# Patient Record
Sex: Female | Born: 1990 | Race: Black or African American | Hispanic: No | Marital: Single | Smoking: Former smoker
Health system: Southern US, Community
[De-identification: ages and names within clinical notes are randomized; demographics above are authoritative.]

## PROBLEM LIST (undated history)

## (undated) DIAGNOSIS — I10 Essential (primary) hypertension: Secondary | ICD-10-CM

## (undated) DIAGNOSIS — N6012 Diffuse cystic mastopathy of left breast: Secondary | ICD-10-CM

## (undated) DIAGNOSIS — F419 Anxiety disorder, unspecified: Secondary | ICD-10-CM

## (undated) DIAGNOSIS — N83209 Unspecified ovarian cyst, unspecified side: Secondary | ICD-10-CM

## (undated) DIAGNOSIS — N6011 Diffuse cystic mastopathy of right breast: Secondary | ICD-10-CM

## (undated) DIAGNOSIS — E559 Vitamin D deficiency, unspecified: Secondary | ICD-10-CM

## (undated) DIAGNOSIS — M549 Dorsalgia, unspecified: Secondary | ICD-10-CM

## (undated) DIAGNOSIS — R7303 Prediabetes: Secondary | ICD-10-CM

## (undated) DIAGNOSIS — E119 Type 2 diabetes mellitus without complications: Secondary | ICD-10-CM

## (undated) DIAGNOSIS — K219 Gastro-esophageal reflux disease without esophagitis: Secondary | ICD-10-CM

## (undated) DIAGNOSIS — G4700 Insomnia, unspecified: Secondary | ICD-10-CM

## (undated) DIAGNOSIS — M2550 Pain in unspecified joint: Secondary | ICD-10-CM

## (undated) DIAGNOSIS — A749 Chlamydial infection, unspecified: Secondary | ICD-10-CM

## (undated) DIAGNOSIS — B977 Papillomavirus as the cause of diseases classified elsewhere: Secondary | ICD-10-CM

## (undated) DIAGNOSIS — F32A Depression, unspecified: Secondary | ICD-10-CM

## (undated) DIAGNOSIS — F329 Major depressive disorder, single episode, unspecified: Secondary | ICD-10-CM

## (undated) DIAGNOSIS — N871 Moderate cervical dysplasia: Secondary | ICD-10-CM

## (undated) DIAGNOSIS — R600 Localized edema: Secondary | ICD-10-CM

## (undated) DIAGNOSIS — Z98891 History of uterine scar from previous surgery: Secondary | ICD-10-CM

## (undated) DIAGNOSIS — R200 Anesthesia of skin: Secondary | ICD-10-CM

## (undated) DIAGNOSIS — E7800 Pure hypercholesterolemia, unspecified: Secondary | ICD-10-CM

## (undated) HISTORY — DX: Diffuse cystic mastopathy of left breast: N60.12

## (undated) HISTORY — DX: History of uterine scar from previous surgery: Z98.891

## (undated) HISTORY — DX: Anesthesia of skin: R20.0

## (undated) HISTORY — DX: Gastro-esophageal reflux disease without esophagitis: K21.9

## (undated) HISTORY — DX: Prediabetes: R73.03

## (undated) HISTORY — DX: Pure hypercholesterolemia, unspecified: E78.00

## (undated) HISTORY — DX: Vitamin D deficiency, unspecified: E55.9

## (undated) HISTORY — DX: Insomnia, unspecified: G47.00

## (undated) HISTORY — DX: Dorsalgia, unspecified: M54.9

## (undated) HISTORY — PX: BREAST SURGERY: SHX581

## (undated) HISTORY — DX: Localized edema: R60.0

## (undated) HISTORY — DX: Pain in unspecified joint: M25.50

## (undated) HISTORY — DX: Moderate cervical dysplasia: N87.1

## (undated) HISTORY — PX: LEEP: SHX91

## (undated) HISTORY — DX: Diffuse cystic mastopathy of right breast: N60.11

---
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
## 2009-01-02 ENCOUNTER — Emergency Department (HOSPITAL_COMMUNITY): Admission: EM | Admit: 2009-01-02 | Discharge: 2009-01-02 | Payer: Self-pay | Admitting: Emergency Medicine

## 2009-01-06 ENCOUNTER — Inpatient Hospital Stay (HOSPITAL_COMMUNITY): Admission: AD | Admit: 2009-01-06 | Discharge: 2009-01-06 | Payer: Self-pay | Admitting: Obstetrics & Gynecology

## 2009-01-11 ENCOUNTER — Emergency Department (HOSPITAL_COMMUNITY): Admission: EM | Admit: 2009-01-11 | Discharge: 2009-01-11 | Payer: Self-pay | Admitting: Emergency Medicine

## 2009-01-27 ENCOUNTER — Emergency Department (HOSPITAL_COMMUNITY): Admission: EM | Admit: 2009-01-27 | Discharge: 2009-01-27 | Payer: Self-pay | Admitting: Family Medicine

## 2009-03-02 ENCOUNTER — Ambulatory Visit: Payer: Self-pay | Admitting: Obstetrics and Gynecology

## 2009-03-08 ENCOUNTER — Ambulatory Visit (HOSPITAL_COMMUNITY): Admission: RE | Admit: 2009-03-08 | Discharge: 2009-03-08 | Payer: Self-pay | Admitting: Obstetrics & Gynecology

## 2009-03-16 ENCOUNTER — Other Ambulatory Visit: Payer: Self-pay | Admitting: Obstetrics and Gynecology

## 2009-03-16 ENCOUNTER — Ambulatory Visit: Payer: Self-pay | Admitting: Obstetrics and Gynecology

## 2009-05-18 ENCOUNTER — Emergency Department (HOSPITAL_COMMUNITY): Admission: EM | Admit: 2009-05-18 | Discharge: 2009-05-18 | Payer: Self-pay | Admitting: Family Medicine

## 2009-05-23 ENCOUNTER — Inpatient Hospital Stay (HOSPITAL_COMMUNITY): Admission: AD | Admit: 2009-05-23 | Discharge: 2009-05-23 | Payer: Self-pay | Admitting: Obstetrics & Gynecology

## 2009-05-23 ENCOUNTER — Ambulatory Visit: Payer: Self-pay | Admitting: Obstetrics & Gynecology

## 2009-06-06 ENCOUNTER — Emergency Department (HOSPITAL_COMMUNITY): Admission: EM | Admit: 2009-06-06 | Discharge: 2009-06-07 | Payer: Self-pay | Admitting: Emergency Medicine

## 2009-06-07 ENCOUNTER — Emergency Department (HOSPITAL_COMMUNITY): Admission: EM | Admit: 2009-06-07 | Discharge: 2009-06-07 | Payer: Self-pay | Admitting: Family Medicine

## 2009-06-24 ENCOUNTER — Emergency Department (HOSPITAL_COMMUNITY): Admission: EM | Admit: 2009-06-24 | Discharge: 2009-06-24 | Payer: Self-pay | Admitting: Emergency Medicine

## 2010-01-25 ENCOUNTER — Emergency Department (HOSPITAL_COMMUNITY): Admission: EM | Admit: 2010-01-25 | Discharge: 2009-04-14 | Payer: Self-pay | Admitting: Emergency Medicine

## 2010-02-18 HISTORY — PX: BREAST BIOPSY: SHX20

## 2010-04-15 ENCOUNTER — Emergency Department (HOSPITAL_COMMUNITY)
Admission: EM | Admit: 2010-04-15 | Discharge: 2010-04-15 | Disposition: A | Discharge status: Home / Self Care | Payer: Self-pay | Attending: Emergency Medicine | Admitting: Emergency Medicine

## 2010-04-15 DIAGNOSIS — F319 Bipolar disorder, unspecified: Secondary | ICD-10-CM | POA: Insufficient documentation

## 2010-04-15 DIAGNOSIS — N6009 Solitary cyst of unspecified breast: Secondary | ICD-10-CM | POA: Insufficient documentation

## 2010-04-15 DIAGNOSIS — E669 Obesity, unspecified: Secondary | ICD-10-CM | POA: Insufficient documentation

## 2010-04-15 DIAGNOSIS — R51 Headache: Secondary | ICD-10-CM | POA: Insufficient documentation

## 2010-04-15 LAB — POCT PREGNANCY, URINE: Preg Test, Ur: NEGATIVE

## 2010-04-16 ENCOUNTER — Other Ambulatory Visit: Payer: Self-pay | Admitting: Emergency Medicine

## 2010-04-16 DIAGNOSIS — N644 Mastodynia: Secondary | ICD-10-CM

## 2010-04-16 LAB — URINE CULTURE: Culture  Setup Time: 201202261706

## 2010-04-18 ENCOUNTER — Other Ambulatory Visit: Payer: Self-pay | Admitting: Emergency Medicine

## 2010-04-18 ENCOUNTER — Ambulatory Visit
Admission: RE | Admit: 2010-04-18 | Discharge: 2010-04-18 | Disposition: A | Discharge status: Home / Self Care | Payer: Self-pay | Source: Ambulatory Visit | Attending: Emergency Medicine | Admitting: Emergency Medicine

## 2010-04-18 DIAGNOSIS — N6320 Unspecified lump in the left breast, unspecified quadrant: Secondary | ICD-10-CM

## 2010-04-18 DIAGNOSIS — N644 Mastodynia: Secondary | ICD-10-CM

## 2010-04-23 ENCOUNTER — Other Ambulatory Visit: Payer: Self-pay

## 2010-04-25 ENCOUNTER — Other Ambulatory Visit: Payer: Self-pay

## 2010-04-26 ENCOUNTER — Other Ambulatory Visit: Payer: Self-pay | Admitting: Diagnostic Radiology

## 2010-04-26 ENCOUNTER — Ambulatory Visit
Admission: RE | Admit: 2010-04-26 | Discharge: 2010-04-26 | Disposition: A | Discharge status: Home / Self Care | Payer: Self-pay | Source: Ambulatory Visit | Attending: Emergency Medicine | Admitting: Emergency Medicine

## 2010-04-26 DIAGNOSIS — N6320 Unspecified lump in the left breast, unspecified quadrant: Secondary | ICD-10-CM

## 2010-05-08 LAB — WET PREP, GENITAL
Trich, Wet Prep: NONE SEEN
WBC, Wet Prep HPF POC: NONE SEEN
Yeast Wet Prep HPF POC: NONE SEEN

## 2010-05-08 LAB — POCT URINALYSIS DIP (DEVICE)
Hgb urine dipstick: NEGATIVE
Ketones, ur: NEGATIVE mg/dL
Protein, ur: NEGATIVE mg/dL
Specific Gravity, Urine: 1.02 (ref 1.005–1.030)
pH: 6.5 (ref 5.0–8.0)

## 2010-05-09 LAB — URINALYSIS, ROUTINE W REFLEX MICROSCOPIC
Bilirubin Urine: NEGATIVE
Bilirubin Urine: NEGATIVE
Ketones, ur: NEGATIVE mg/dL
Ketones, ur: NEGATIVE mg/dL
Nitrite: NEGATIVE
Nitrite: NEGATIVE
Protein, ur: NEGATIVE mg/dL
Protein, ur: NEGATIVE mg/dL
Urobilinogen, UA: 0.2 mg/dL (ref 0.0–1.0)
pH: 5.5 (ref 5.0–8.0)

## 2010-05-09 LAB — CBC
Hemoglobin: 13.4 g/dL (ref 12.0–15.0)
MCHC: 33 g/dL (ref 30.0–36.0)
MCV: 94.5 fL (ref 78.0–100.0)
RDW: 13.3 % (ref 11.5–15.5)

## 2010-05-09 LAB — WET PREP, GENITAL
Clue Cells Wet Prep HPF POC: NONE SEEN
Trich, Wet Prep: NONE SEEN
Yeast Wet Prep HPF POC: NONE SEEN

## 2010-05-09 LAB — COMPREHENSIVE METABOLIC PANEL
ALT: 19 U/L (ref 0–35)
Calcium: 9.6 mg/dL (ref 8.4–10.5)
Creatinine, Ser: 0.91 mg/dL (ref 0.4–1.2)
GFR calc non Af Amer: >60 mL/min (ref >60)
Glucose, Bld: 81 mg/dL (ref 70–99)
Sodium: 136 mEq/L (ref 135–145)
Total Protein: 7.3 g/dL (ref 6.0–8.3)

## 2010-05-22 LAB — POCT URINALYSIS DIP (DEVICE)
Bilirubin Urine: NEGATIVE
Ketones, ur: NEGATIVE mg/dL
Protein, ur: NEGATIVE mg/dL
Specific Gravity, Urine: 1.02 (ref 1.005–1.030)
pH: 5.5 (ref 5.0–8.0)

## 2010-05-22 LAB — URINE CULTURE: Colony Count: >=100000

## 2010-05-23 LAB — COMPREHENSIVE METABOLIC PANEL
ALT: 19 U/L (ref 0–35)
AST: 23 U/L (ref 0–37)
Alkaline Phosphatase: 91 U/L (ref 39–117)
CO2: 25 mEq/L (ref 19–32)
CO2: 23 mEq/L (ref 19–32)
Calcium: 9.2 mg/dL (ref 8.4–10.5)
Chloride: 105 mEq/L (ref 96–112)
Creatinine, Ser: 0.88 mg/dL (ref 0.4–1.2)
GFR calc Af Amer: >60 mL/min (ref >60)
GFR calc non Af Amer: >60 mL/min (ref >60)
GFR calc non Af Amer: >60 mL/min (ref >60)
Glucose, Bld: 85 mg/dL (ref 70–99)
Glucose, Bld: 85 mg/dL (ref 70–99)
Potassium: 3.8 mEq/L (ref 3.5–5.1)
Sodium: 136 mEq/L (ref 135–145)
Total Protein: 6.9 g/dL (ref 6.0–8.3)

## 2010-05-23 LAB — DIFFERENTIAL
Basophils Relative: 1 % (ref 0–1)
Eosinophils Absolute: 0.1 10*3/uL (ref 0.0–0.7)
Lymphocytes Relative: 35 % (ref 12–46)
Lymphs Abs: 1.9 10*3/uL (ref 0.7–4.0)
Monocytes Relative: 7 % (ref 3–12)
Neutrophils Relative %: 54 % (ref 43–77)
Neutrophils Relative %: 60 % (ref 43–77)

## 2010-05-23 LAB — URINALYSIS, ROUTINE W REFLEX MICROSCOPIC
Bilirubin Urine: NEGATIVE
Bilirubin Urine: NEGATIVE
Ketones, ur: NEGATIVE mg/dL
Ketones, ur: NEGATIVE mg/dL
Leukocytes, UA: NEGATIVE
Nitrite: NEGATIVE
Nitrite: NEGATIVE
Protein, ur: NEGATIVE mg/dL
pH: 5.5 (ref 5.0–8.0)
pH: 5 (ref 5.0–8.0)

## 2010-05-23 LAB — CBC
Hemoglobin: 12.7 g/dL (ref 12.0–15.0)
MCHC: 34.4 g/dL (ref 30.0–36.0)
MCV: 95.5 fL (ref 78.0–100.0)
RBC: 3.9 MIL/uL (ref 3.87–5.11)
RBC: 3.96 MIL/uL (ref 3.87–5.11)
RDW: 12.7 % (ref 11.5–15.5)

## 2010-05-23 LAB — WET PREP, GENITAL
Clue Cells Wet Prep HPF POC: NONE SEEN
Trich, Wet Prep: NONE SEEN
Trich, Wet Prep: NONE SEEN
WBC, Wet Prep HPF POC: NONE SEEN
Yeast Wet Prep HPF POC: NONE SEEN
Yeast Wet Prep HPF POC: NONE SEEN

## 2010-05-23 LAB — POCT URINALYSIS DIP (DEVICE)
Bilirubin Urine: NEGATIVE
Hgb urine dipstick: NEGATIVE
Ketones, ur: NEGATIVE mg/dL
Nitrite: NEGATIVE
Specific Gravity, Urine: 1.015 (ref 1.005–1.030)
pH: 7.5 (ref 5.0–8.0)

## 2010-05-23 LAB — POCT PREGNANCY, URINE: Preg Test, Ur: NEGATIVE

## 2010-05-23 LAB — GC/CHLAMYDIA PROBE AMP, GENITAL: Chlamydia, DNA Probe: NEGATIVE

## 2010-05-23 LAB — URINE MICROSCOPIC-ADD ON

## 2010-05-23 LAB — AMYLASE: Amylase: 82 U/L (ref 27–131)

## 2010-05-23 LAB — LIPASE, BLOOD: Lipase: 14 U/L (ref 11–59)

## 2010-05-23 LAB — PREGNANCY, URINE: Preg Test, Ur: NEGATIVE

## 2010-10-24 IMAGING — US US TRANSVAGINAL NON-OB
1 series · 14 of 25 positions shown · non-contrast
Comparison: None

CLINICAL DATA: Lower abdominal pain, greatest on the right.

TRANSABDOMINAL AND TRANSVAGINAL ULTRASOUND OF PELVIS
TECHNIQUE: Both transabdominal and transvaginal ultrasound
examinations of the pelvis were performed including evaluation of
the uterus, ovaries, adnexal regions, and pelvic cul-de-sac.

[Series 1: us pelvis complete · 62 acquisitions, 14 frames shown]
[im 1/62]
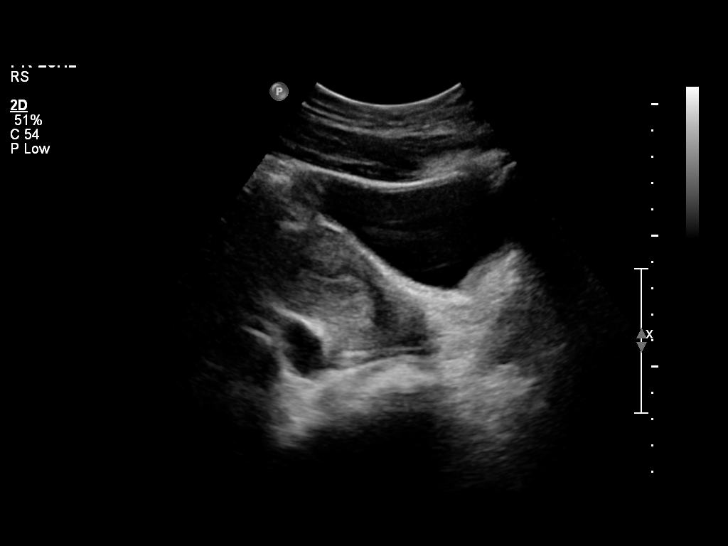
[im 6/62]
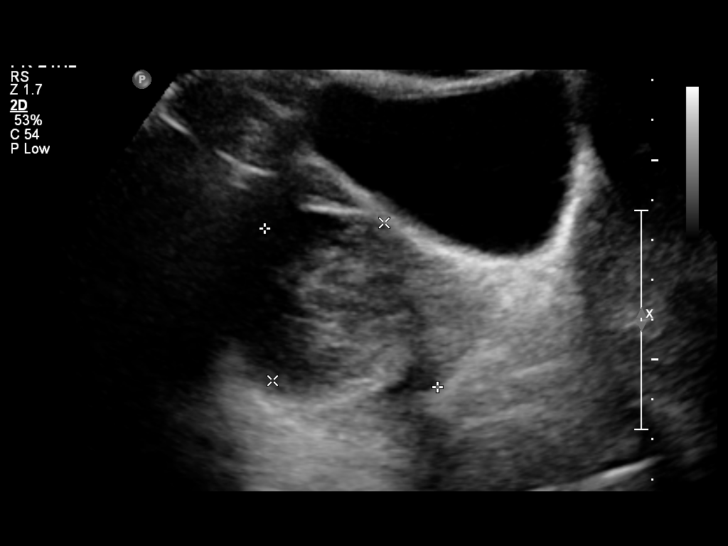
[im 11/62]
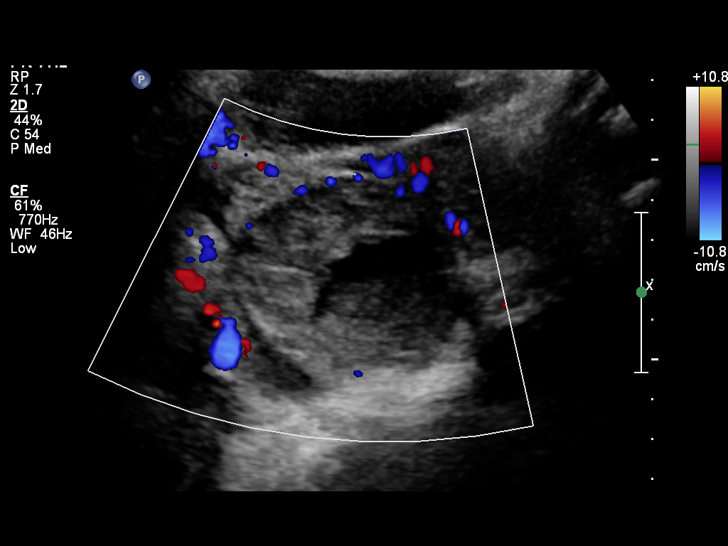
[im 16/62]
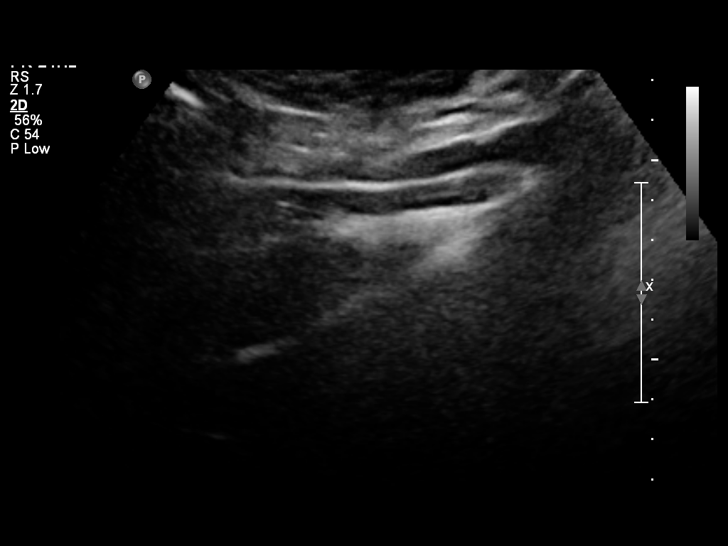
[im 21/62]
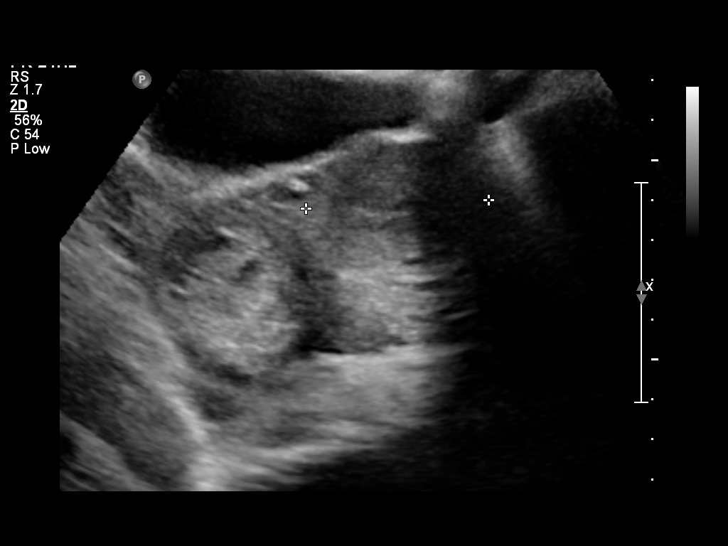
[im 23/62]
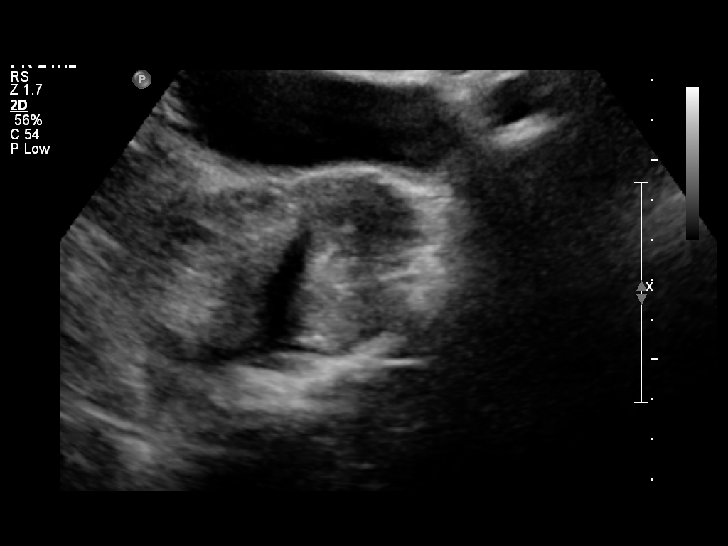
[im 28/62]
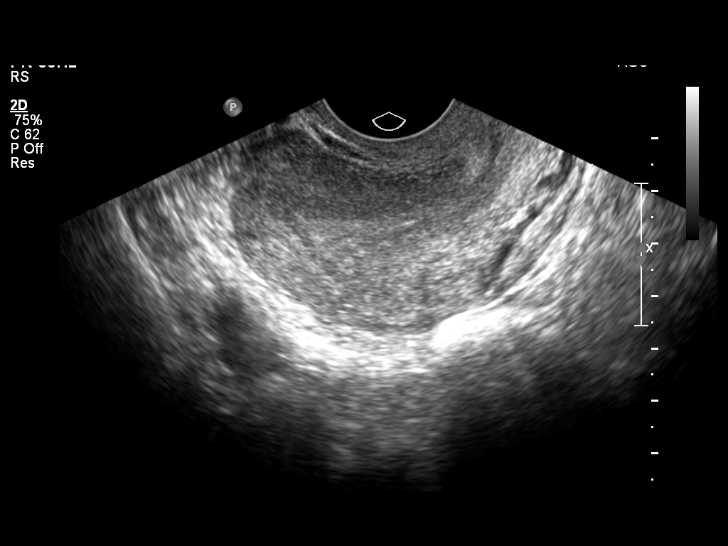
[im 34/62]
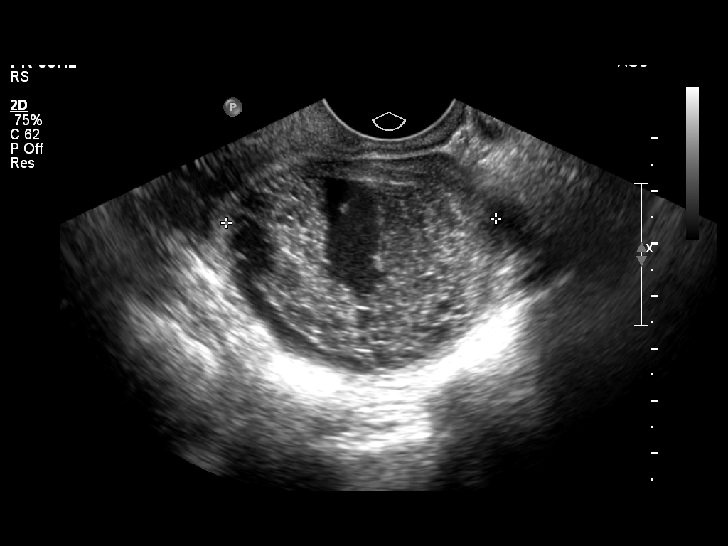
[im 39/62]
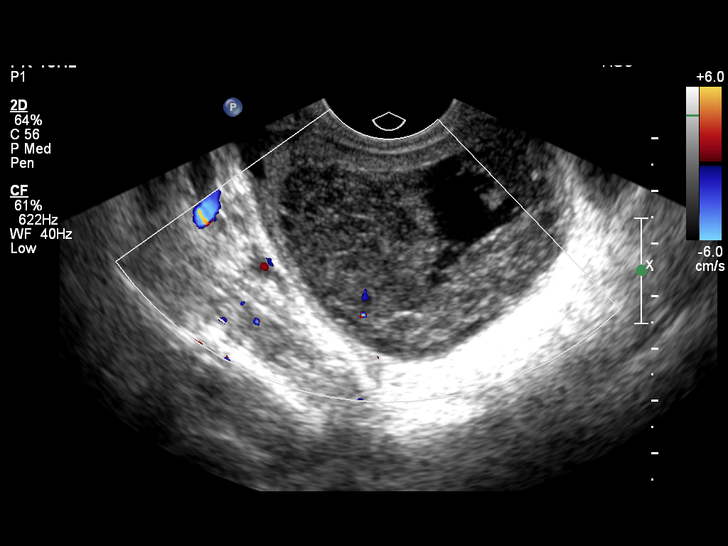
[im 41/62]
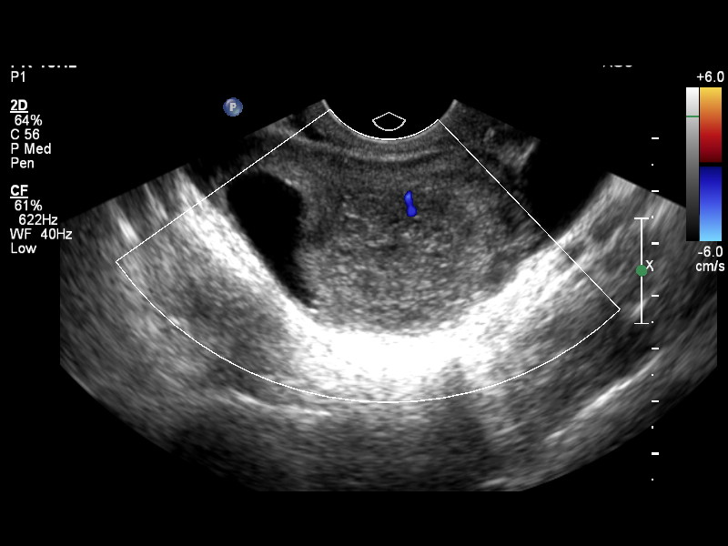
[im 46/62]
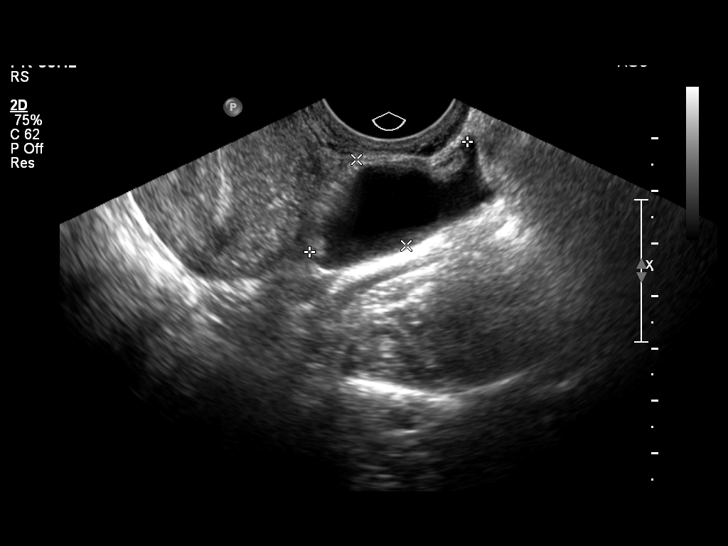
[im 51/62]
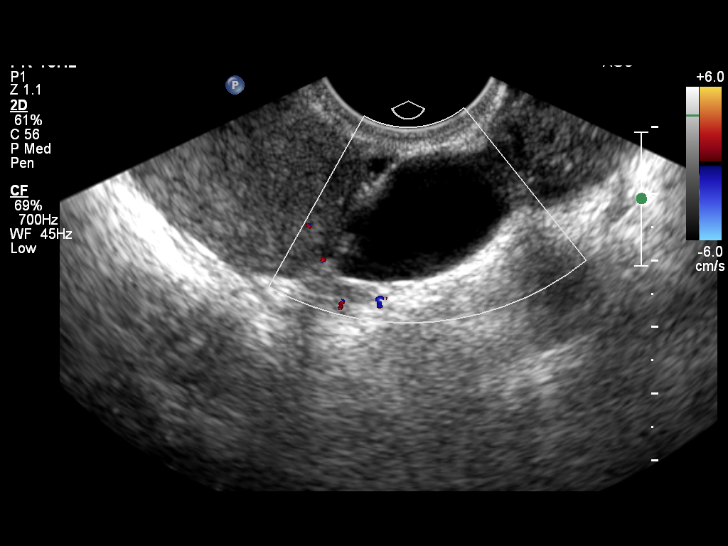
[im 56/62]
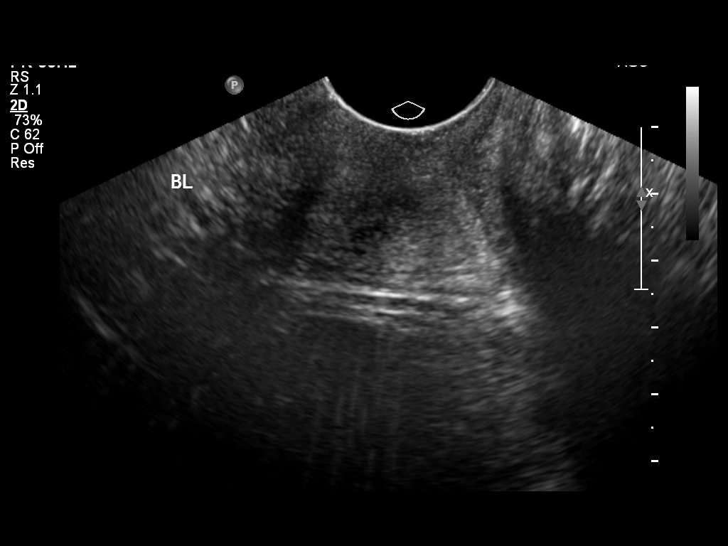
[im 62/62]
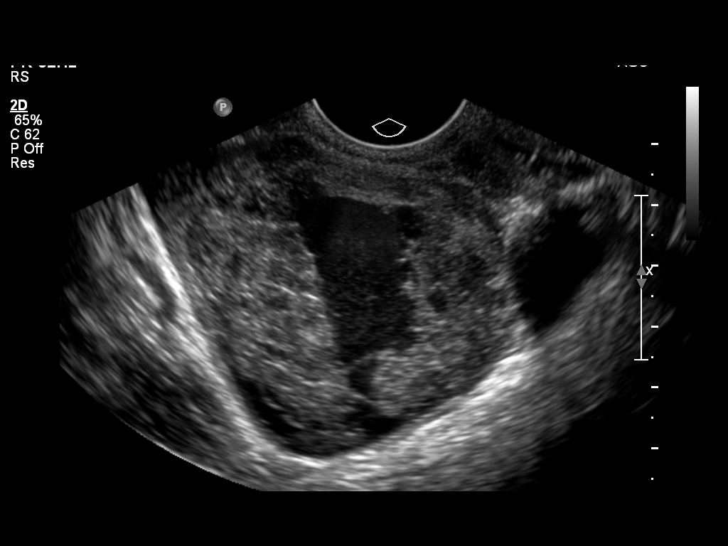

[14 of 25 positions shown; findings below may reference images not displayed]

FINDINGS: Uterus: 7.3 x 3.6 x 4.6 cm.  No focal abnormality.  Normal
echotexture.

Endometrium: Normal, 5 mm.

Right Ovary: 6.2 x 4.6 x 5.1 cm.  There is a complex mass within
the right ovary measuring 5.5 x 4.9 x 4.0 cm. This mass contains
both solid and cystic components.  Debris/echoes noted within the
cystic areas.  Blood flow seen within the solid components of the
mass.

Left Ovary: 3.7 x 1.9 x 2.0 cm.  2.6 cm simple appearing cyst
present.

Other Findings:  No free fluid.
IMPRESSION: Complex mass within the right ovary, containing solid and cystic
components.  Primary concern is for cystic ovarian neoplasm.

2.6 cm left ovarian cyst.

## 2011-04-21 ENCOUNTER — Encounter (HOSPITAL_COMMUNITY): Payer: Self-pay | Admitting: *Deleted

## 2011-04-21 ENCOUNTER — Emergency Department (HOSPITAL_COMMUNITY)
Admission: EM | Admit: 2011-04-21 | Discharge: 2011-04-21 | Disposition: A | Discharge status: Home / Self Care | Payer: Self-pay | Attending: Emergency Medicine | Admitting: Emergency Medicine

## 2011-04-21 DIAGNOSIS — R51 Headache: Secondary | ICD-10-CM | POA: Insufficient documentation

## 2011-04-21 DIAGNOSIS — R110 Nausea: Secondary | ICD-10-CM | POA: Insufficient documentation

## 2011-04-21 LAB — POCT I-STAT, CHEM 8
Calcium, Ion: 1.25 mmol/L (ref 1.12–1.32)
Glucose, Bld: 91 mg/dL (ref 70–99)
HCT: 39 % (ref 36.0–46.0)
Hemoglobin: 13.3 g/dL (ref 12.0–15.0)
TCO2: 25 mmol/L (ref 0–100)

## 2011-04-21 MED ORDER — IBUPROFEN 800 MG PO TABS
800.0000 mg | ORAL_TABLET | Freq: Once | ORAL | Status: AC
  Administered 2011-04-21: 800 mg via ORAL
  Filled 2011-04-21: qty 1
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
## 2011-04-21 NOTE — ED Provider Notes (Signed)
History     CSN: [id number]  Arrival date & time 04/21/11  2044   First MD Initiated Contact with Patient 04/21/11 2113      Chief Complaint  Patient presents with  . Headache     HPI  History provided by the patient. Patient is a 21 year old female with no significant past medical history who presents with complaints of a headache for the past 2 days. Headache is similar to previous headaches. It is associated with nausea and general body aches. Headache is made worse by bright lights and some loud noises. Headache described as a constant throbbing and pounding. Symptoms are described as mild to moderate. Patient has not taken any medications for her symptoms. She denies any other aggravating or alleviating factors. Patient does state that she was slightly concerned of maybe having symptoms aggravated by her gas to her range stove. She states that she was using this to cook and could smell an egg-like smell throughout the house while cooking. She does report calling with a control of her headache symptoms could be related to the gas. She has since opened her windows to air out the apartment but reports symptoms are continued. Patient has no other significant medical problems.    History reviewed. No pertinent past medical history.  History reviewed. No pertinent past surgical history.  History reviewed. No pertinent family history.  History  Substance Use Topics  . Smoking status: Current Everyday Smoker  . Smokeless tobacco: Not on file  . Alcohol Use: No    OB History    Grav Para Term Preterm Abortions TAB SAB Ect Mult Living                  Review of Systems  Constitutional: Negative for fever and chills.  Respiratory: Negative for cough and shortness of breath.   Cardiovascular: Negative for chest pain.  Gastrointestinal: Positive for nausea. Negative for vomiting, abdominal pain, diarrhea and constipation.  Neurological: Positive for headaches. Negative for  dizziness, speech difficulty, weakness and numbness.  All other systems reviewed and are negative.    Allergies  Review of patient's allergies indicates no known allergies.  Home Medications  No current outpatient prescriptions on file.  BP 137/84  Pulse 88  Temp(Src) 98.4 F (36.9 C) (Oral)  Resp 18  SpO2 98%  LMP 03/24/2011  Physical Exam  Nursing note and vitals reviewed. Constitutional: She is oriented to person, place, and time. She appears well-developed and well-nourished. No distress.  HENT:  Head: Normocephalic.  Mouth/Throat: Oropharynx is clear and moist.  Eyes: EOM are normal. Pupils are equal, round, and reactive to light.  Neck: Normal range of motion. Neck supple.  Cardiovascular: Normal rate and regular rhythm.   Pulmonary/Chest: Effort normal and breath sounds normal. No stridor. No respiratory distress. She has no wheezes. She has no rales.  Abdominal: Soft. She exhibits no distension. There is no tenderness. There is no rebound and no guarding.  Neurological: She is alert and oriented to person, place, and time. She has normal strength. No cranial nerve deficit or sensory deficit. Coordination and gait normal.  Skin: Skin is warm and dry. No rash noted.  Psychiatric: She has a normal mood and affect. Her behavior is normal.    ED Course  Procedures   Results for orders placed during the hospital encounter of 04/21/11  POCT I-STAT, CHEM 8      Component Value Range   Sodium 140  135 - 145 (mEq/L)  Potassium 3.7  3.5 - 5.1 (mEq/L)   Chloride 106  96 - 112 (mEq/L)   BUN 14  6 - 23 (mg/dL)   Creatinine, Ser 1.61  0.50 - 1.10 (mg/dL)   Glucose, Bld 91  70 - 99 (mg/dL)   Calcium, Ion 0.96  0.45 - 1.32 (mmol/L)   TCO2 25  0 - 100 (mmol/L)   Hemoglobin 13.3  12.0 - 15.0 (g/dL)   HCT 40.9  81.1 - 91.4 (%)      1. Headache       MDM  9:25 PM patient seen in nondilated. Patient in no acute distress.   Patient discussed with attending physician.  Patient with normal nonfocal neuro exam. Symptoms are improved with ibuprofen. Patient felt safe to return home.     Angus Seller, [place] 04/22/11 [phone]

## 2011-04-21 NOTE — ED Notes (Signed)
Patient complaining of a headache since Friday.  Patient states that the pain has been intermittent for the last two days, but became constant since 1500 this afternoon.  Patient reports light sensitivity and nausea; denies vomiting.  Denies blurred vision.  Patient rates pain 8/10 on the numerical pain scale; describes pain as "throbbing".  Upon arrival to room, patient changed into gown.  Patient alert and oriented x4; PERRL present.  Will continue to monitor.

## 2011-04-21 NOTE — ED Notes (Signed)
Headache since Friday with nausea.  She has headaches

## 2011-04-22 NOTE — ED Provider Notes (Signed)
Medical screening examination/treatment/procedure(s) were performed by non-physician practitioner and as supervising physician I was immediately available for consultation/collaboration.   Gladyes Kudo, MD 04/22/11 1107 

## 2011-06-03 ENCOUNTER — Emergency Department (HOSPITAL_COMMUNITY): Payer: No Typology Code available for payment source

## 2011-06-03 ENCOUNTER — Encounter (HOSPITAL_COMMUNITY): Payer: Self-pay

## 2011-06-03 ENCOUNTER — Emergency Department (HOSPITAL_COMMUNITY)
Admission: EM | Admit: 2011-06-03 | Discharge: 2011-06-03 | Disposition: A | Discharge status: Home / Self Care | Payer: No Typology Code available for payment source | Attending: Emergency Medicine | Admitting: Emergency Medicine

## 2011-06-03 DIAGNOSIS — R51 Headache: Secondary | ICD-10-CM | POA: Insufficient documentation

## 2011-06-03 DIAGNOSIS — S335XXA Sprain of ligaments of lumbar spine, initial encounter: Secondary | ICD-10-CM | POA: Insufficient documentation

## 2011-06-03 DIAGNOSIS — I10 Essential (primary) hypertension: Secondary | ICD-10-CM | POA: Insufficient documentation

## 2011-06-03 DIAGNOSIS — M545 Low back pain, unspecified: Secondary | ICD-10-CM | POA: Insufficient documentation

## 2011-06-03 DIAGNOSIS — S139XXA Sprain of joints and ligaments of unspecified parts of neck, initial encounter: Secondary | ICD-10-CM | POA: Insufficient documentation

## 2011-06-03 DIAGNOSIS — M546 Pain in thoracic spine: Secondary | ICD-10-CM | POA: Insufficient documentation

## 2011-06-03 DIAGNOSIS — S161XXA Strain of muscle, fascia and tendon at neck level, initial encounter: Secondary | ICD-10-CM

## 2011-06-03 DIAGNOSIS — S239XXA Sprain of unspecified parts of thorax, initial encounter: Secondary | ICD-10-CM | POA: Insufficient documentation

## 2011-06-03 DIAGNOSIS — M542 Cervicalgia: Secondary | ICD-10-CM | POA: Insufficient documentation

## 2011-06-03 DIAGNOSIS — S39012A Strain of muscle, fascia and tendon of lower back, initial encounter: Secondary | ICD-10-CM

## 2011-06-03 HISTORY — DX: Essential (primary) hypertension: I10

## 2011-06-03 MED ORDER — IBUPROFEN 800 MG PO TABS: 800.0000 mg | ORAL_TABLET | Freq: Three times a day (TID) | ORAL | Status: AC

## 2011-06-03 MED ORDER — HYDROMORPHONE HCL PF 1 MG/ML IJ SOLN
1.0000 mg | Freq: Once | INTRAMUSCULAR | Status: AC
  Administered 2011-06-03: 1 mg via INTRAMUSCULAR
  Filled 2011-06-03: qty 1
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
## 2011-06-03 NOTE — ED Notes (Signed)
Patient's head elevated and is now feeling better. Respirations have returned to normal and sats are 100% on RA.
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
## 2011-06-03 NOTE — ED Notes (Addendum)
Patient experiencing panic attack with increase respirations of 32 per minute. Patient stas of RA is 100%. EDP advised.
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
## 2011-06-05 ENCOUNTER — Ambulatory Visit: Payer: BC Managed Care – PPO | Admitting: Medical

## 2011-06-12 ENCOUNTER — Ambulatory Visit: Payer: BC Managed Care – PPO | Admitting: Medical

## 2011-11-19 ENCOUNTER — Encounter (HOSPITAL_COMMUNITY): Payer: Self-pay | Admitting: *Deleted

## 2011-11-19 DIAGNOSIS — R109 Unspecified abdominal pain: Secondary | ICD-10-CM | POA: Insufficient documentation

## 2011-11-19 DIAGNOSIS — F17200 Nicotine dependence, unspecified, uncomplicated: Secondary | ICD-10-CM | POA: Insufficient documentation

## 2011-11-19 DIAGNOSIS — I10 Essential (primary) hypertension: Secondary | ICD-10-CM | POA: Insufficient documentation

## 2011-11-19 LAB — URINALYSIS, ROUTINE W REFLEX MICROSCOPIC
Hgb urine dipstick: NEGATIVE
Nitrite: NEGATIVE
Specific Gravity, Urine: 1.019 (ref 1.005–1.030)
Urobilinogen, UA: 1 mg/dL (ref 0.0–1.0)

## 2011-11-19 LAB — COMPREHENSIVE METABOLIC PANEL
ALT: 12 U/L (ref 0–35)
AST: 19 U/L (ref 0–37)
CO2: 22 mEq/L (ref 19–32)
Chloride: 105 mEq/L (ref 96–112)
GFR calc non Af Amer: 89 mL/min — ABNORMAL LOW (ref >90)
Sodium: 138 mEq/L (ref 135–145)
Total Bilirubin: 0.3 mg/dL (ref 0.3–1.2)

## 2011-11-19 LAB — CBC WITH DIFFERENTIAL/PLATELET
Basophils Absolute: 0 10*3/uL (ref 0.0–0.1)
HCT: 36.8 % (ref 36.0–46.0)
Lymphocytes Relative: 44 % (ref 12–46)
Neutro Abs: 3.1 10*3/uL (ref 1.7–7.7)
Platelets: 223 10*3/uL (ref 150–400)
RDW: 12.3 % (ref 11.5–15.5)
WBC: 6.9 10*3/uL (ref 4.0–10.5)

## 2011-11-19 LAB — PREGNANCY, URINE: Preg Test, Ur: NEGATIVE

## 2011-11-19 LAB — URINE MICROSCOPIC-ADD ON

## 2011-11-19 NOTE — ED Notes (Signed)
abd cramps for 3 months with nausea.  lmp irregular and feeling dizzy when riding in a car

## 2011-11-20 ENCOUNTER — Emergency Department (HOSPITAL_COMMUNITY)
Admission: EM | Admit: 2011-11-20 | Discharge: 2011-11-20 | Disposition: A | Discharge status: Home / Self Care | Payer: Self-pay | Attending: Emergency Medicine | Admitting: Emergency Medicine

## 2011-11-20 DIAGNOSIS — R109 Unspecified abdominal pain: Secondary | ICD-10-CM

## 2011-11-20 MED ORDER — OXYCODONE-ACETAMINOPHEN 5-325 MG PO TABS
1.0000 | ORAL_TABLET | Freq: Once | ORAL | Status: AC
  Administered 2011-11-20: 1 via ORAL
  Filled 2011-11-20: qty 1

## 2011-11-20 NOTE — ED Provider Notes (Signed)
History     CSN: [id number]  Arrival date & time 11/19/11  2255   First MD Initiated Contact with Patient 11/20/11 0145      Chief Complaint  Patient presents with  . Abdominal Pain     Patient is a 21 y.o. female presenting with abdominal pain. The history is provided by the patient.  Abdominal Pain The primary symptoms of the illness include abdominal pain. The primary symptoms of the illness do not include fever, nausea, vomiting, diarrhea, dysuria, vaginal discharge or vaginal bleeding. The current episode started more than 2 days ago. The onset of the illness was gradual. The problem has not changed since onset. The patient states that she believes she is currently not pregnant. Symptoms associated with the illness do not include chills, urgency, frequency or back pain.  pt reports pain for several weeks that is not improving No h/o abdominal surgeries She reports pain is in lower abdomen  Past Medical History  Diagnosis Date  . Hypertension     History reviewed. No pertinent past surgical history.  No family history on file.  History  Substance Use Topics  . Smoking status: Current Every Day Smoker  . Smokeless tobacco: Not on file  . Alcohol Use: No    OB History    Grav Para Term Preterm Abortions TAB SAB Ect Mult Living                  Review of Systems  Constitutional: Negative for fever and chills.  Gastrointestinal: Positive for abdominal pain. Negative for nausea, vomiting and diarrhea.  Genitourinary: Negative for dysuria, urgency, frequency, vaginal bleeding and vaginal discharge.  Musculoskeletal: Negative for back pain.  All other systems reviewed and are negative.    Allergies  Review of patient's allergies indicates no known allergies.  Home Medications  No current outpatient prescriptions on file.  BP 137/68  Pulse 77  Temp 98.3 F (36.8 C) (Oral)  Resp 18  SpO2 100%  LMP 09/19/2011  Physical Exam CONSTITUTIONAL: Well  developed/well nourished HEAD AND FACE: Normocephalic/atraumatic EYES: EOMI/PERRL ENMT: Mucous membranes moist NECK: supple no meningeal signs SPINE:entire spine nontender CV: S1/S2 noted, no murmurs/rubs/gallops noted LUNGS: Lungs are clear to auscultation bilaterally, no apparent distress ABDOMEN: soft, nontender, no rebound or guarding GU:no cva tenderness NEURO: Pt is awake/alert, moves all extremitiesx4 EXTREMITIES: pulses normal, full ROM SKIN: warm, color normal PSYCH: no abnormalities of mood noted   ED Course  Procedures   Labs Reviewed  URINALYSIS, ROUTINE W REFLEX MICROSCOPIC - Abnormal; Notable for the following:    Leukocytes, UA TRACE (*)     All other components within normal limits  COMPREHENSIVE METABOLIC PANEL - Abnormal; Notable for the following:    GFR calc non Af Amer 89 (*)     All other components within normal limits  URINE MICROSCOPIC-ADD ON - Abnormal; Notable for the following:    Squamous Epithelial / LPF FEW (*)     All other components within normal limits  PREGNANCY, URINE  CBC WITH DIFFERENTIAL  LIPASE, BLOOD  GC/CHLAMYDIA PROBE AMP, GENITAL  pt refused pelvic exam and requested d/c home Advised f/u with Gynecology I doubt acute abdominal process at this time   MDM  Nursing notes including past medical history and social history reviewed and considered in documentation Labs/vital reviewed and considered         Joya Gaskins, MD 11/20/11 [phone]

## 2012-01-26 ENCOUNTER — Encounter (HOSPITAL_COMMUNITY): Payer: Self-pay | Admitting: *Deleted

## 2012-01-26 ENCOUNTER — Inpatient Hospital Stay (HOSPITAL_COMMUNITY)
Admission: AD | Admit: 2012-01-26 | Discharge: 2012-01-27 | Disposition: A | Discharge status: Hospice | Payer: Self-pay | Source: Ambulatory Visit | Attending: Obstetrics & Gynecology | Admitting: Obstetrics & Gynecology

## 2012-01-26 ENCOUNTER — Inpatient Hospital Stay (HOSPITAL_COMMUNITY): Payer: Self-pay

## 2012-01-26 DIAGNOSIS — M545 Low back pain, unspecified: Secondary | ICD-10-CM | POA: Insufficient documentation

## 2012-01-26 DIAGNOSIS — N949 Unspecified condition associated with female genital organs and menstrual cycle: Secondary | ICD-10-CM | POA: Insufficient documentation

## 2012-01-26 DIAGNOSIS — R110 Nausea: Secondary | ICD-10-CM | POA: Insufficient documentation

## 2012-01-26 DIAGNOSIS — N739 Female pelvic inflammatory disease, unspecified: Secondary | ICD-10-CM | POA: Insufficient documentation

## 2012-01-26 DIAGNOSIS — N730 Acute parametritis and pelvic cellulitis: Secondary | ICD-10-CM

## 2012-01-26 HISTORY — DX: Depression, unspecified: F32.A

## 2012-01-26 HISTORY — DX: Unspecified ovarian cyst, unspecified side: N83.209

## 2012-01-26 HISTORY — DX: Major depressive disorder, single episode, unspecified: F32.9

## 2012-01-26 HISTORY — DX: Chlamydial infection, unspecified: A74.9

## 2012-01-26 HISTORY — DX: Anxiety disorder, unspecified: F41.9

## 2012-01-26 LAB — URINALYSIS, ROUTINE W REFLEX MICROSCOPIC
Glucose, UA: NEGATIVE mg/dL
Leukocytes, UA: NEGATIVE
Nitrite: NEGATIVE
Protein, ur: NEGATIVE mg/dL
pH: 7 (ref 5.0–8.0)

## 2012-01-26 LAB — WET PREP, GENITAL: Trich, Wet Prep: NONE SEEN

## 2012-01-26 LAB — CBC
HCT: 38.6 % (ref 36.0–46.0)
Hemoglobin: 12.7 g/dL (ref 12.0–15.0)
MCV: 93 fL (ref 78.0–100.0)
RDW: 12.1 % (ref 11.5–15.5)
WBC: 6.2 10*3/uL (ref 4.0–10.5)

## 2012-01-26 MED ORDER — CEFTRIAXONE SODIUM 250 MG IJ SOLR
250.0000 mg | Freq: Once | INTRAMUSCULAR | Status: AC
  Administered 2012-01-27: 250 mg via INTRAMUSCULAR
  Filled 2012-01-26: qty 250

## 2012-01-26 MED ORDER — KETOROLAC TROMETHAMINE 60 MG/2ML IM SOLN
60.0000 mg | Freq: Once | INTRAMUSCULAR | Status: AC
  Administered 2012-01-26: 60 mg via INTRAMUSCULAR
  Filled 2012-01-26: qty 2

## 2012-01-26 MED ORDER — ONDANSETRON 8 MG PO TBDP
8.0000 mg | ORAL_TABLET | Freq: Once | ORAL | Status: AC
  Administered 2012-01-27: 8 mg via ORAL
  Filled 2012-01-26: qty 1

## 2012-01-26 MED ORDER — AZITHROMYCIN 250 MG PO TABS
1000.0000 mg | ORAL_TABLET | Freq: Once | ORAL | Status: AC
  Administered 2012-01-27: 1000 mg via ORAL
  Filled 2012-01-26: qty 4

## 2012-01-26 NOTE — MAU Note (Signed)
Patient states she has had an abnormal period in December. Started 12-1 then heavy with clotting on 12-3. No bleeding now. States she has had lower back pain for 2 weeks and a headache for one week that makes her nauseated. Has had lower abdominal cramping and while having intercourse tonight had a lot of pain. No bleeding or vaginal discharge at this time.

## 2012-01-26 NOTE — MAU Note (Signed)
Pt says her period usually comes every 4 0r 5 wks but this past period came 2 weeks after the Nov cycle and it was extra heavy.   Describes an urgency and a pressure/pulling in her low abdomen when she voids for the past few days.

## 2012-01-26 NOTE — MAU Provider Note (Signed)
History     CSN: [id number]  Arrival date and time: 01/26/12 2006   None     Chief Complaint  Patient presents with  . Back Pain  . Possible Pregnancy  . Headache   HPI 21 y.o. G0P0 with low back pain x 1 week, cramping x 2 weeks, worse x 1 week, no better with tylenol or lidoderm patch (got it from a friend). Pressure with urination, no pain, no vaginal discharge or bleeding. Patient's last menstrual period was 01/19/2012. Last period was heavier than usual and came 2 weeks after prior period. H/O irregular periods. Sexually active, uses condoms intermittently.   Past Medical History  Diagnosis Date  . Hypertension   . Ovarian cyst   . Anxiety   . Depression med made her navel itching and  made her sleepy so she quit taking them  . Chlamydia     History reviewed. No pertinent past surgical history.  Family History  Problem Relation Age of Onset  . Diabetes Mother   . Heart disease Mother   . Hypertension Mother   . Depression Mother   . Stroke Mother   . Arthritis Mother   . Stroke Father   . Asthma Sister   . Kidney disease Brother   . Diabetes Maternal Aunt   . Hypertension Maternal Aunt   . Asthma Maternal Aunt   . Epilepsy Maternal Aunt   . Hypertension Maternal Grandmother   . Dementia Maternal Grandmother   . Heart disease Maternal Grandmother   . Diabetes Paternal Grandmother     History  Substance Use Topics  . Smoking status: Current Every Day Smoker -- 0.2 packs/day for 5 years    Types: Cigarettes  . Smokeless tobacco: Not on file  . Alcohol Use: Yes     Comment: occasionallly    Allergies: No Known Allergies  Prescriptions prior to admission  Medication Sig Dispense Refill  . acetaminophen (TYLENOL) 325 MG tablet Take 650 mg by mouth every 6 (six) hours as needed. For pain        Review of Systems  Constitutional: Negative.   Respiratory: Negative.   Cardiovascular: Negative.   Gastrointestinal: Positive for nausea and abdominal  pain. Negative for vomiting, diarrhea and constipation.  Genitourinary: Negative for dysuria, urgency, frequency, hematuria and flank pain.       Negative for vaginal bleeding, vaginal discharge, positive for dyspareunia  Musculoskeletal: Negative.   Neurological: Positive for headaches.  Psychiatric/Behavioral: Negative.    Physical Exam   Blood pressure 144/82, pulse 75, temperature 98.2 F (36.8 C), temperature source Oral, resp. rate 18, height 5\' 5"  (1.651 m), weight 242 lb 9.6 oz (110.043 kg), last menstrual period 01/19/2012, SpO2 100.00%.  Physical Exam  Nursing note and vitals reviewed. Constitutional: She is oriented to person, place, and time. She appears well-developed and well-nourished. She appears distressed (uncomfortable appearing, rocking in bed).  Cardiovascular: Normal rate.   Respiratory: Effort normal.  Genitourinary: There is no rash, tenderness or lesion on the right labia. There is no rash, tenderness or lesion on the left labia. Uterus is tender. Cervix exhibits motion tenderness and friability. Right adnexum displays tenderness. Right adnexum displays no mass and no fullness. Left adnexum displays tenderness. Left adnexum displays no mass and no fullness. No bleeding around the vagina. Vaginal discharge (clear, thin, malodorous) found.       Exam limited by body habitus   Musculoskeletal: Normal range of motion.  Neurological: She is alert and oriented to person, place,  and time.  Skin: Skin is warm and dry.  Psychiatric: She has a normal mood and affect.    MAU Course  Procedures Results for orders placed during the hospital encounter of 01/26/12 (from the past 24 hour(s))  URINALYSIS, ROUTINE W REFLEX MICROSCOPIC     Status: Normal   Collection Time   01/26/12  8:43 PM      Component Value Range   Color, Urine YELLOW  YELLOW   APPearance CLEAR  CLEAR   Specific Gravity, Urine 1.020  1.005 - 1.030   pH 7.0  5.0 - 8.0   Glucose, UA NEGATIVE  NEGATIVE  mg/dL   Hgb urine dipstick NEGATIVE  NEGATIVE   Bilirubin Urine NEGATIVE  NEGATIVE   Ketones, ur NEGATIVE  NEGATIVE mg/dL   Protein, ur NEGATIVE  NEGATIVE mg/dL   Urobilinogen, UA 0.2  0.0 - 1.0 mg/dL   Nitrite NEGATIVE  NEGATIVE   Leukocytes, UA NEGATIVE  NEGATIVE  POCT PREGNANCY, URINE     Status: Normal   Collection Time   01/26/12  8:49 PM      Component Value Range   Preg Test, Ur NEGATIVE  NEGATIVE  WET PREP, GENITAL     Status: Abnormal   Collection Time   01/26/12 10:25 PM      Component Value Range   Yeast Wet Prep HPF POC NONE SEEN  NONE SEEN   Trich, Wet Prep NONE SEEN  NONE SEEN   Clue Cells Wet Prep HPF POC FEW (*) NONE SEEN   WBC, Wet Prep HPF POC FEW (*) NONE SEEN  CBC     Status: Normal   Collection Time   01/26/12 10:54 PM      Component Value Range   WBC 6.2  4.0 - 10.5 K/uL   RBC 4.15  3.87 - 5.11 MIL/uL   Hemoglobin 12.7  12.0 - 15.0 g/dL   HCT 16.1  09.6 - 04.5 %   MCV 93.0  78.0 - 100.0 fL   MCH 30.6  26.0 - 34.0 pg   MCHC 32.9  30.0 - 36.0 g/dL   RDW 40.9  81.1 - 91.4 %   Platelets 211  150 - 400 K/uL      . [COMPLETED] ketorolac  60 mg Intramuscular Once      Assessment and Plan  U/S pending Care assumed by [facility], NP  Jahmiya Guidotti 01/26/2012, 11:47 PM   @ 23:55 patient returned from ultrasound re examined. Abdomen soft, tender mid lower abdomen without guarding or rebound.  No CVA tenderness.  Patient states that Toradol helped some but continues to have pain.   Assessment: 21 y.o. female with pelvic pain   Nausea   PID  Plan:  Discussed with patient results of ultrasound and labs, Telford Nab, CNM had already discussed with the patient that given her pelvic pain and CMT that we would treat as PID if they ultrasound did not identify any other problem. Patient voices understanding.    Rx flagyl   Rocephin 250 mg IM now   Zithromax 1 gram PO now   Zofran 8 mg ODT now   Follow up with GYN Clinic in one week, return here for problems  before then.  I have reviewed this patient's vital signs, nurses notes, appropriate labs and imaging.    Medication List     As of 01/27/2012 12:03 AM    START taking these medications         metroNIDAZOLE 500 MG tablet  Commonly known as: FLAGYL   Take 1 tablet (500 mg total) by mouth 2 (two) times daily.      CONTINUE taking these medications         acetaminophen 325 MG tablet   Commonly known as: TYLENOL          Where to get your medications    These are the prescriptions that you need to pick up. We sent them to a specific pharmacy, so you will need to go there to get them.   [id number] - [place] ([place]  - [address]    [address] [place] ([place] [address]    Phone: [phone]        metroNIDAZOLE 500 MG tablet

## 2012-01-27 MED ORDER — METRONIDAZOLE 500 MG PO TABS: 500.0000 mg | ORAL_TABLET | Freq: Two times a day (BID) | ORAL | Status: DC

## 2012-01-27 NOTE — MAU Provider Note (Signed)
Attestation of Attending Supervision of Advanced Practitioner (CNM/NP): Evaluation and management procedures were performed by the Advanced Practitioner under my supervision and collaboration.  I have reviewed the Advanced Practitioner's note and chart, and I agree with the management and plan.  HARRAWAY-SMITH, Klair Leising 5:59 AM

## 2012-01-28 LAB — GC/CHLAMYDIA PROBE AMP: CT Probe RNA: POSITIVE — AB

## 2012-01-29 ENCOUNTER — Telehealth (HOSPITAL_COMMUNITY): Payer: Self-pay | Admitting: Advanced Practice Midwife

## 2012-01-29 NOTE — Telephone Encounter (Signed)
Telephone call to patient regarding positive chlamydia culture, patient not in, left message for her to call.  Patient was treated at time of visit.  Patient needs to be notified and she will need to notify her partner for treatment.  Report of treatment faxed to health department.

## 2012-01-29 NOTE — Telephone Encounter (Signed)
Patient returned call, notified of positive chlamydia culture.  Instructed patient to notify her partner for treatment.

## 2012-02-07 ENCOUNTER — Encounter: Payer: BC Managed Care – PPO | Admitting: Advanced Practice Midwife

## 2012-02-14 ENCOUNTER — Emergency Department (HOSPITAL_COMMUNITY)
Admission: EM | Admit: 2012-02-14 | Discharge: 2012-02-14 | Disposition: A | Discharge status: Home / Self Care | Payer: Self-pay | Attending: Emergency Medicine | Admitting: Emergency Medicine

## 2012-02-14 ENCOUNTER — Encounter (HOSPITAL_COMMUNITY): Payer: Self-pay | Admitting: Emergency Medicine

## 2012-02-14 DIAGNOSIS — Z3202 Encounter for pregnancy test, result negative: Secondary | ICD-10-CM | POA: Insufficient documentation

## 2012-02-14 DIAGNOSIS — A088 Other specified intestinal infections: Secondary | ICD-10-CM | POA: Insufficient documentation

## 2012-02-14 DIAGNOSIS — R197 Diarrhea, unspecified: Secondary | ICD-10-CM | POA: Insufficient documentation

## 2012-02-14 DIAGNOSIS — Z8619 Personal history of other infectious and parasitic diseases: Secondary | ICD-10-CM | POA: Insufficient documentation

## 2012-02-14 DIAGNOSIS — Z8659 Personal history of other mental and behavioral disorders: Secondary | ICD-10-CM | POA: Insufficient documentation

## 2012-02-14 DIAGNOSIS — I10 Essential (primary) hypertension: Secondary | ICD-10-CM | POA: Insufficient documentation

## 2012-02-14 DIAGNOSIS — A084 Viral intestinal infection, unspecified: Secondary | ICD-10-CM

## 2012-02-14 DIAGNOSIS — Z8742 Personal history of other diseases of the female genital tract: Secondary | ICD-10-CM | POA: Insufficient documentation

## 2012-02-14 DIAGNOSIS — F17200 Nicotine dependence, unspecified, uncomplicated: Secondary | ICD-10-CM | POA: Insufficient documentation

## 2012-02-14 LAB — CBC WITH DIFFERENTIAL/PLATELET
Basophils Absolute: 0 10*3/uL (ref 0.0–0.1)
Basophils Relative: 0 % (ref 0–1)
Eosinophils Absolute: 0 10*3/uL (ref 0.0–0.7)
Hemoglobin: 13.6 g/dL (ref 12.0–15.0)
MCH: 31.5 pg (ref 26.0–34.0)
MCHC: 33.7 g/dL (ref 30.0–36.0)
Monocytes Relative: 11 % (ref 3–12)
Neutro Abs: 3.6 10*3/uL (ref 1.7–7.7)
Neutrophils Relative %: 69 % (ref 43–77)
RDW: 12.3 % (ref 11.5–15.5)

## 2012-02-14 LAB — URINALYSIS, MICROSCOPIC ONLY
Glucose, UA: NEGATIVE mg/dL
Leukocytes, UA: NEGATIVE
Nitrite: NEGATIVE
Specific Gravity, Urine: 1.031 — ABNORMAL HIGH (ref 1.005–1.030)
pH: 5 (ref 5.0–8.0)

## 2012-02-14 LAB — COMPREHENSIVE METABOLIC PANEL
AST: 20 U/L (ref 0–37)
Albumin: 3.5 g/dL (ref 3.5–5.2)
BUN: 9 mg/dL (ref 6–23)
Chloride: 104 mEq/L (ref 96–112)
Creatinine, Ser: 0.86 mg/dL (ref 0.50–1.10)
Potassium: 3.4 mEq/L — ABNORMAL LOW (ref 3.5–5.1)
Total Protein: 7 g/dL (ref 6.0–8.3)

## 2012-02-14 LAB — LIPASE, BLOOD: Lipase: 13 U/L (ref 11–59)

## 2012-02-14 LAB — POCT PREGNANCY, URINE: Preg Test, Ur: NEGATIVE

## 2012-02-14 MED ORDER — ONDANSETRON HCL 4 MG PO TABS: 4.0000 mg | ORAL_TABLET | Freq: Three times a day (TID) | ORAL | Status: DC | PRN

## 2012-02-14 MED ORDER — ONDANSETRON 4 MG PO TBDP
4.0000 mg | ORAL_TABLET | Freq: Once | ORAL | Status: AC
  Administered 2012-02-14: 4 mg via ORAL
  Filled 2012-02-14: qty 1

## 2012-02-14 MED ORDER — HYDROCODONE-ACETAMINOPHEN 5-325 MG PO TABS: 1.0000 | ORAL_TABLET | ORAL | Status: DC | PRN

## 2012-02-14 NOTE — ED Notes (Signed)
Per EMS: Pt states she has been having NVD x 4 days.

## 2012-02-14 NOTE — ED Provider Notes (Signed)
Medical screening examination/treatment/procedure(s) were performed by non-physician practitioner and as supervising physician I was immediately available for consultation/collaboration.   Joya Gaskins, MD 02/14/12 [phone]

## 2012-02-14 NOTE — ED Provider Notes (Signed)
History     CSN: [id number]  Arrival date & time 02/14/12  1617   First MD Initiated Contact with Patient 02/14/12 1825      Chief Complaint  Patient presents with  . Nausea  . Emesis  . Diarrhea    (Consider location/radiation/quality/duration/timing/severity/associated sxs/prior treatment) HPI Comments: This is a 21 year old female, who presents emergency department with chief complaint of nausea, vomiting, and diarrhea x4 days. Patient has not taken anything to alleviate her symptoms. She denies any known sick contacts. She states that she has had several episodes of vomiting and diarrhea each day. There are no aggravating or alleviating factors. Patient's discomfort is mild to moderate.  The history is provided by the patient. No language interpreter was used.    Past Medical History  Diagnosis Date  . Hypertension   . Ovarian cyst   . Anxiety   . Depression med made her navel itching and  made her sleepy so she quit taking them  . Chlamydia     No past surgical history on file.  Family History  Problem Relation Age of Onset  . Diabetes Mother   . Heart disease Mother   . Hypertension Mother   . Depression Mother   . Stroke Mother   . Arthritis Mother   . Stroke Father   . Asthma Sister   . Kidney disease Brother   . Diabetes Maternal Aunt   . Hypertension Maternal Aunt   . Asthma Maternal Aunt   . Epilepsy Maternal Aunt   . Hypertension Maternal Grandmother   . Dementia Maternal Grandmother   . Heart disease Maternal Grandmother   . Diabetes Paternal Grandmother     History  Substance Use Topics  . Smoking status: Current Every Day Smoker -- 0.2 packs/day for 5 years    Types: Cigarettes  . Smokeless tobacco: Not on file  . Alcohol Use: Yes     Comment: occasionallly    OB History    Grav Para Term Preterm Abortions TAB SAB Ect Mult Living   0               Review of Systems  All other systems reviewed and are negative.    Allergies    Review of patient's allergies indicates no known allergies.  Home Medications   Current Outpatient Rx  Name  Route  Sig  Dispense  Refill  . HYDROCODONE-ACETAMINOPHEN 5-325 MG PO TABS   Oral   Take 1 tablet by mouth every 4 (four) hours as needed for pain.   6 tablet   0   . ONDANSETRON HCL 4 MG PO TABS   Oral   Take 1 tablet (4 mg total) by mouth every 8 (eight) hours as needed for nausea.   10 tablet   0     BP 129/79  Pulse 99  Temp 98.6 F (37 C) (Oral)  Resp 18  SpO2 98%  LMP 02/14/2012  Physical Exam  Nursing note and vitals reviewed. Constitutional: She is oriented to person, place, and time. She appears well-developed and well-nourished.       Obese  HENT:  Head: Normocephalic and atraumatic.  Mouth/Throat: Oropharynx is clear and moist. No oropharyngeal exudate.  Eyes: Conjunctivae normal and EOM are normal. Pupils are equal, round, and reactive to light.  Neck: Normal range of motion. Neck supple.  Cardiovascular: Normal rate and regular rhythm.  Exam reveals no gallop and no friction rub.   No murmur heard. Pulmonary/Chest: Effort normal and  breath sounds normal. No respiratory distress. She has no wheezes. She has no rales. She exhibits no tenderness.  Abdominal: Soft. Bowel sounds are normal. She exhibits no distension and no mass. There is no tenderness. There is no rebound and no guarding.       No right upper quadrant tenderness, right lower quadrant tenderness, no tenderness at McBurney's point, no rebound tenderness, no peritoneal signs. Mildly tender in epigastric region.  Musculoskeletal: Normal range of motion. She exhibits no edema and no tenderness.  Neurological: She is alert and oriented to person, place, and time.  Skin: Skin is warm and dry.  Psychiatric: She has a normal mood and affect. Her behavior is normal. Judgment and thought content normal.    ED Course  Procedures (including critical care time)  Labs Reviewed  COMPREHENSIVE  METABOLIC PANEL - Abnormal; Notable for the following:    Potassium 3.4 (*)     All other components within normal limits  URINALYSIS, MICROSCOPIC ONLY - Abnormal; Notable for the following:    Color, Urine AMBER (*)  BIOCHEMICALS MAY BE AFFECTED BY COLOR   APPearance CLOUDY (*)     Specific Gravity, Urine 1.031 (*)     Hgb urine dipstick LARGE (*)     All other components within normal limits  CBC WITH DIFFERENTIAL  LIPASE, BLOOD  POCT PREGNANCY, URINE   Results for orders placed during the hospital encounter of 02/14/12  CBC WITH DIFFERENTIAL      Component Value Range   WBC 5.2  4.0 - 10.5 K/uL   RBC 4.32  3.87 - 5.11 MIL/uL   Hemoglobin 13.6  12.0 - 15.0 g/dL   HCT 16.1  09.6 - 04.5 %   MCV 93.3  78.0 - 100.0 fL   MCH 31.5  26.0 - 34.0 pg   MCHC 33.7  30.0 - 36.0 g/dL   RDW 40.9  81.1 - 91.4 %   Platelets 184  150 - 400 K/uL   Neutrophils Relative 69  43 - 77 %   Neutro Abs 3.6  1.7 - 7.7 K/uL   Lymphocytes Relative 20  12 - 46 %   Lymphs Abs 1.0  0.7 - 4.0 K/uL   Monocytes Relative 11  3 - 12 %   Monocytes Absolute 0.6  0.1 - 1.0 K/uL   Eosinophils Relative 0  0 - 5 %   Eosinophils Absolute 0.0  0.0 - 0.7 K/uL   Basophils Relative 0  0 - 1 %   Basophils Absolute 0.0  0.0 - 0.1 K/uL  COMPREHENSIVE METABOLIC PANEL      Component Value Range   Sodium 136  135 - 145 mEq/L   Potassium 3.4 (*) 3.5 - 5.1 mEq/L   Chloride 104  96 - 112 mEq/L   CO2 23  19 - 32 mEq/L   Glucose, Bld 82  70 - 99 mg/dL   BUN 9  6 - 23 mg/dL   Creatinine, Ser 7.82  0.50 - 1.10 mg/dL   Calcium 9.0  8.4 - 95.6 mg/dL   Total Protein 7.0  6.0 - 8.3 g/dL   Albumin 3.5  3.5 - 5.2 g/dL   AST 20  0 - 37 U/L   ALT 15  0 - 35 U/L   Alkaline Phosphatase 80  39 - 117 U/L   Total Bilirubin 0.6  0.3 - 1.2 mg/dL   GFR calc non Af Amer >90  >90 mL/min   GFR calc Af Amer >90  >  90 mL/min  LIPASE, BLOOD      Component Value Range   Lipase 13  11 - 59 U/L  URINALYSIS, MICROSCOPIC ONLY      Component Value  Range   Color, Urine AMBER (*) YELLOW   APPearance CLOUDY (*) CLEAR   Specific Gravity, Urine 1.031 (*) 1.005 - 1.030   pH 5.0  5.0 - 8.0   Glucose, UA NEGATIVE  NEGATIVE mg/dL   Hgb urine dipstick LARGE (*) NEGATIVE   Bilirubin Urine NEGATIVE  NEGATIVE   Ketones, ur NEGATIVE  NEGATIVE mg/dL   Protein, ur NEGATIVE  NEGATIVE mg/dL   Urobilinogen, UA 0.2  0.0 - 1.0 mg/dL   Nitrite NEGATIVE  NEGATIVE   Leukocytes, UA NEGATIVE  NEGATIVE   RBC / HPF 21-50  <3 RBC/hpf   Squamous Epithelial / LPF RARE  RARE   Urine-Other MUCOUS PRESENT    POCT PREGNANCY, URINE      Component Value Range   Preg Test, Ur NEGATIVE  NEGATIVE       1. Viral gastroenteritis       MDM  This is a 21 year old female with viral gastroenteritis.  Patient doesn't appear dry, she is tolerating oral by mouth. I have instructed the patient to continue rehydrating. Given the patient BRAT diet instructions.  Patient is feeling much better after having Zofran. She is tolerating by mouth. I'm going to discharge the patient to home with primary care followup. Will discharge the patient with Zofran and a few pain pills. Patient understands and agrees with the plan. Return precautions have been given.        Roxy Horseman, PA-C 02/14/12 1909

## 2012-07-31 ENCOUNTER — Inpatient Hospital Stay (HOSPITAL_COMMUNITY)
Admission: AD | Admit: 2012-07-31 | Discharge: 2012-08-01 | Disposition: A | Discharge status: Home / Self Care | Payer: BC Managed Care – PPO | Source: Ambulatory Visit | Attending: Obstetrics and Gynecology | Admitting: Obstetrics and Gynecology

## 2012-07-31 DIAGNOSIS — O2390 Unspecified genitourinary tract infection in pregnancy, unspecified trimester: Secondary | ICD-10-CM | POA: Insufficient documentation

## 2012-07-31 DIAGNOSIS — O210 Mild hyperemesis gravidarum: Secondary | ICD-10-CM | POA: Insufficient documentation

## 2012-07-31 DIAGNOSIS — N390 Urinary tract infection, site not specified: Secondary | ICD-10-CM | POA: Insufficient documentation

## 2012-07-31 DIAGNOSIS — N644 Mastodynia: Secondary | ICD-10-CM | POA: Insufficient documentation

## 2012-07-31 DIAGNOSIS — O219 Vomiting of pregnancy, unspecified: Secondary | ICD-10-CM

## 2012-07-31 DIAGNOSIS — O2341 Unspecified infection of urinary tract in pregnancy, first trimester: Secondary | ICD-10-CM

## 2012-08-01 ENCOUNTER — Encounter (HOSPITAL_COMMUNITY): Payer: Self-pay

## 2012-08-01 DIAGNOSIS — O210 Mild hyperemesis gravidarum: Secondary | ICD-10-CM

## 2012-08-01 LAB — URINALYSIS, ROUTINE W REFLEX MICROSCOPIC
Bilirubin Urine: NEGATIVE
Ketones, ur: NEGATIVE mg/dL
Nitrite: POSITIVE — AB
Protein, ur: NEGATIVE mg/dL
Urobilinogen, UA: 0.2 mg/dL (ref 0.0–1.0)

## 2012-08-01 LAB — URINE MICROSCOPIC-ADD ON

## 2012-08-01 MED ORDER — PROMETHAZINE HCL 25 MG PO TABS: 25.0000 mg | ORAL_TABLET | Freq: Four times a day (QID) | ORAL | Status: DC | PRN

## 2012-08-01 MED ORDER — ONDANSETRON 8 MG PO TBDP
8.0000 mg | ORAL_TABLET | Freq: Once | ORAL | Status: AC
  Administered 2012-08-01: 8 mg via ORAL
  Filled 2012-08-01: qty 1

## 2012-08-01 MED ORDER — AMOXICILLIN 500 MG PO TABS: 500.0000 mg | ORAL_TABLET | Freq: Two times a day (BID) | ORAL | Status: DC

## 2012-08-01 NOTE — MAU Note (Signed)
Patient states onset of nausea x 1 week with bilateral breast discomfort for about a month, LMP 06/24/12

## 2012-08-01 NOTE — MAU Provider Note (Signed)
History     CSN: [id number]  Arrival date and time: 07/31/12 2354   First Provider Initiated Contact with Patient 08/01/12 [phone]      Chief Complaint  Patient presents with  . Breast Pain  . Nausea   HPI  Misty Walsh is a 22 y.o. G1P0 at [redacted]w[redacted]d who presents today with breast tenderness and nausea. She states that the breast tenderness started last month, and the nausea started last week. Of note, she is eating while I was in the room with her. She has also noticed urinary frequency. She denies any pain or bleeding.   Past Medical History  Diagnosis Date  . Hypertension   . Ovarian cyst   . Anxiety   . Depression med made her navel itching and  made her sleepy so she quit taking them  . Chlamydia     No past surgical history on file.  Family History  Problem Relation Age of Onset  . Diabetes Mother   . Heart disease Mother   . Hypertension Mother   . Depression Mother   . Stroke Mother   . Arthritis Mother   . Stroke Father   . Asthma Sister   . Kidney disease Brother   . Diabetes Maternal Aunt   . Hypertension Maternal Aunt   . Asthma Maternal Aunt   . Epilepsy Maternal Aunt   . Hypertension Maternal Grandmother   . Dementia Maternal Grandmother   . Heart disease Maternal Grandmother   . Diabetes Paternal Grandmother     History  Substance Use Topics  . Smoking status: Current Every Day Smoker -- 0.25 packs/day for 5 years    Types: Cigarettes  . Smokeless tobacco: Not on file  . Alcohol Use: Yes     Comment: occasionallly    Allergies: No Known Allergies  Prescriptions prior to admission  Medication Sig Dispense Refill  . HYDROcodone-acetaminophen (NORCO/VICODIN) 5-325 MG per tablet Take 1 tablet by mouth every 4 (four) hours as needed for pain.  6 tablet  0  . ondansetron (ZOFRAN) 4 MG tablet Take 1 tablet (4 mg total) by mouth every 8 (eight) hours as needed for nausea.  10 tablet  0    Review of Systems  Constitutional: Negative for fever.   Eyes: Negative for blurred vision.  Respiratory: Negative for shortness of breath.   Cardiovascular: Negative for chest pain.  Gastrointestinal: Positive for nausea and constipation (last BM was today). Negative for vomiting, abdominal pain and diarrhea.  Genitourinary: Positive for frequency. Negative for dysuria and urgency.  Neurological: Negative for dizziness and headaches.   Physical Exam   Blood pressure 134/73, pulse 90, temperature 98.5 F (36.9 C), temperature source Oral, resp. rate 18, height 5\' 5"  (1.651 m), weight 261 lb 9.6 oz (118.661 kg), last menstrual period 06/24/2012.  Physical Exam  Nursing note and vitals reviewed. Constitutional: She is oriented to person, place, and time. She appears well-developed and well-nourished. No distress.  Cardiovascular: Normal rate.   Respiratory: Effort normal.  GI: Soft. There is no tenderness.  Neurological: She is alert and oriented to person, place, and time.  Skin: Skin is warm and dry.  Psychiatric: She has a normal mood and affect.    MAU Course  Procedures  Results for orders placed during the hospital encounter of 07/31/12 (from the past 24 hour(s))  POCT PREGNANCY, URINE     Status: Abnormal   Collection Time    08/01/12 12:07 AM      Result  Value Range   Preg Test, Ur POSITIVE (*) NEGATIVE  URINALYSIS, ROUTINE W REFLEX MICROSCOPIC     Status: Abnormal   Collection Time    08/01/12 12:11 AM      Result Value Range   Color, Urine YELLOW  YELLOW   APPearance CLEAR  CLEAR   Specific Gravity, Urine >1.030 (*) 1.005 - 1.030   pH 5.0  5.0 - 8.0   Glucose, UA NEGATIVE  NEGATIVE mg/dL   Hgb urine dipstick MODERATE (*) NEGATIVE   Bilirubin Urine NEGATIVE  NEGATIVE   Ketones, ur NEGATIVE  NEGATIVE mg/dL   Protein, ur NEGATIVE  NEGATIVE mg/dL   Urobilinogen, UA 0.2  0.0 - 1.0 mg/dL   Nitrite POSITIVE (*) NEGATIVE   Leukocytes, UA TRACE (*) NEGATIVE  URINE MICROSCOPIC-ADD ON     Status: Abnormal   Collection Time     08/01/12 12:11 AM      Result Value Range   Squamous Epithelial / LPF FEW (*) RARE   WBC, UA 3-6  <3 WBC/hpf   RBC / HPF 7-10  <3 RBC/hpf   Bacteria, UA MANY (*) RARE    Assessment and Plan   1. Nausea and vomiting in pregnancy prior to [redacted] weeks gestation   2. UTI in pregnancy, antepartum, first trimester    RX: Phenergan 25mg  po q6 hours PRN Amoxicillin 500 mg PO bid x 7 #14, 0 RF [facility] ASAP Pregnancy verification letter given List of OB providers given 1st trimester danger signs reviewed  Tawnya Crook 08/01/2012, 12:43 AM

## 2012-08-04 LAB — URINE CULTURE: Colony Count: >=100000

## 2012-08-05 ENCOUNTER — Other Ambulatory Visit: Payer: Self-pay | Admitting: Advanced Practice Midwife

## 2012-08-05 MED ORDER — NITROFURANTOIN MONOHYD MACRO 100 MG PO CAPS: 100.0000 mg | ORAL_CAPSULE | Freq: Two times a day (BID) | ORAL | Status: AC

## 2012-08-05 NOTE — Progress Notes (Signed)
Urine culture + e. Coli, rx amoxicillin, resistant on sensitivity report. Rx sent for Macrobid 100 mg BID.

## 2012-08-05 NOTE — MAU Provider Note (Signed)
Attestation of Attending Supervision of Advanced Practitioner: Evaluation and management procedures were performed by the PA/NP/CNM/OB Fellow under my supervision/collaboration. Chart reviewed and agree with management and plan.  Rhenda Oregon V 08/05/2012 9:01 PM

## 2012-08-06 ENCOUNTER — Telehealth: Payer: Self-pay | Admitting: Gynecology

## 2012-08-06 NOTE — Telephone Encounter (Signed)
Called pt to let her know that her urine culture showed she was resistant to antibiotic prescribed and a new prescription for Macrobid had been sent to her pharmacy Pt called back to say that she was not able to pay for macrobid and if there was another one available that wasa cheaper. Keflex 500mg  QID for 7 days($4)called to pharmacist at [facility] Discussed importance of pt taking all medication and to return to MAU if chills, fever, or increase in pain Note- pt states she has had a hx of frequent UTIs

## 2012-08-06 NOTE — MAU Provider Note (Signed)
Pt called in response to message by Georges Mouse- informed pt of resistance to amoxicillin and to pick up prescription for MAcrobid that [facility] sent; pt also states that phenergan not working for nausea; recommend pt try B6 100mg  BID and Unisom 12.5mg  BID; pt to report fever, chills or increase in pain; pt states that her urine has not cleared at this point Misty Walsh [facility]

## 2012-08-10 NOTE — MAU Provider Note (Signed)
Attestation of Attending Supervision of Advanced Practitioner (CNM/NP): Evaluation and management procedures were performed by the Advanced Practitioner under my supervision and collaboration.  I have reviewed the Advanced Practitioner's note and chart, and I agree with the management and plan.  Bernhardt Riemenschneider 08/10/2012 11:50 AM   

## 2012-08-11 ENCOUNTER — Inpatient Hospital Stay (HOSPITAL_COMMUNITY): Payer: Medicaid Other

## 2012-08-11 ENCOUNTER — Encounter (HOSPITAL_COMMUNITY): Payer: Self-pay | Admitting: *Deleted

## 2012-08-11 ENCOUNTER — Inpatient Hospital Stay (HOSPITAL_COMMUNITY)
Admission: AD | Admit: 2012-08-11 | Discharge: 2012-08-12 | Disposition: A | Discharge status: Home / Self Care | Payer: Medicaid Other | Source: Ambulatory Visit | Attending: Obstetrics & Gynecology | Admitting: Obstetrics & Gynecology

## 2012-08-11 DIAGNOSIS — O2390 Unspecified genitourinary tract infection in pregnancy, unspecified trimester: Secondary | ICD-10-CM | POA: Insufficient documentation

## 2012-08-11 DIAGNOSIS — O26899 Other specified pregnancy related conditions, unspecified trimester: Secondary | ICD-10-CM

## 2012-08-11 DIAGNOSIS — R109 Unspecified abdominal pain: Secondary | ICD-10-CM | POA: Insufficient documentation

## 2012-08-11 DIAGNOSIS — O26859 Spotting complicating pregnancy, unspecified trimester: Secondary | ICD-10-CM | POA: Insufficient documentation

## 2012-08-11 DIAGNOSIS — A5901 Trichomonal vulvovaginitis: Secondary | ICD-10-CM | POA: Insufficient documentation

## 2012-08-11 DIAGNOSIS — O418X10 Other specified disorders of amniotic fluid and membranes, first trimester, not applicable or unspecified: Secondary | ICD-10-CM

## 2012-08-11 LAB — CBC
Hemoglobin: 13.3 g/dL (ref 12.0–15.0)
MCH: 31.4 pg (ref 26.0–34.0)
MCV: 90.6 fL (ref 78.0–100.0)
Platelets: 252 10*3/uL (ref 150–400)
RBC: 4.24 MIL/uL (ref 3.87–5.11)
WBC: 8.9 10*3/uL (ref 4.0–10.5)

## 2012-08-11 NOTE — MAU Note (Signed)
PT SAYS  SHE WAS HERE ON 6-14-  WAS HERE TO SEE IF PREG.   THEN ON SAT 6-14- WHEN SHE WIPED - SHE SAW [place]- ON TOILET PAPER-  THEN SHE CALLED  HERE- TOLD OK-     THEN ON 6-19-    SOMEBODY FROM HERE CALLED HER TO CHANGED MED-  SO SHE WENT TO PHARMACY AND GOT MED-  STARTED ON Friday   BUT DID NOT TAKE  ANY TODAY AND ONLY 1  YESTERDAY--- SHE WAS VOMITING YESTERDAY  AND TODAY.     TODAY- VOMITED X3 .  SAYS COUSIN HAS BEEN SICK.      AT HOME BEFORE SHE CAME,  IN B-ROOM,   VAG BLEEDING- DARKER.   NO PAD ON IN TRIAGE.      CRAMPS STARTED TODAY

## 2012-08-11 NOTE — MAU Provider Note (Signed)
Chief Complaint: No chief complaint on file.   First Provider Initiated Contact with Patient 08/11/12 2349     SUBJECTIVE HPI: Misty Walsh is a 22 y.o. G1P0 at [redacted]w[redacted]d by LMP who presents with pink spotting 08/01/12, small amount of dark bleeding and cramping 5/10 on pain scale today. Has not tried anything foe the pain. Denies fever, chills, passage of tissue, urinary complaints, GI complaints except for occasional nausea.    Past Medical History  Diagnosis Date  . Hypertension   . Ovarian cyst   . Anxiety   . Depression med made her navel itching and  made her sleepy so she quit taking them  . Chlamydia    OB History   Grav Para Term Preterm Abortions TAB SAB Ect Mult Living   1              # Outc Date GA Lbr Len/2nd Wgt Sex Del Anes PTL Lv   1 CUR              No past surgical history on file. History   Social History  . Marital Status: Single    Spouse Name: N/A    Number of Children: N/A  . Years of Education: N/A   Occupational History  . Not on file.   Social History Main Topics  . Smoking status: Current Every Day Smoker -- 0.25 packs/day for 5 years    Types: Cigarettes  . Smokeless tobacco: Not on file  . Alcohol Use: Yes     Comment: occasionallly  . Drug Use: No  . Sexually Active: Yes    Birth Control/ Protection: Condom     Comment: sometimes   Other Topics Concern  . Not on file   Social History Narrative  . No narrative on file   No current facility-administered medications on file prior to encounter.   Current Outpatient Prescriptions on File Prior to Encounter  Medication Sig Dispense Refill  . promethazine (PHENERGAN) 25 MG tablet Take 1 tablet (25 mg total) by mouth every 6 (six) hours as needed for nausea.  30 tablet  0   No Known Allergies  ROS: Pertinent items in HPI  OBJECTIVE Blood pressure 162/87, pulse 77, temperature 98.6 F (37 C), temperature source Oral, height 5\' 4"  (1.626 m), weight 116.121 kg (256 lb), last menstrual  period 06/24/2012. GENERAL: Well-developed, well-nourished female in no acute distress.  HEENT: Normocephalic HEART: normal rate RESP: normal effort ABDOMEN: Soft, non-tender EXTREMITIES: Nontender, no edema NEURO: Alert and oriented SPECULUM EXAM: NEFG, small amount of yellowish-white, malodorous discharge, no blood noted, cervix clean. BIMANUAL: cervix closed; UTA uterine size due to body habitus. Intolerance of entire pelvic exam, but no specific areas of tenderness or masses palpated.   LAB RESULTS Results for orders placed during the hospital encounter of 08/11/12 (from the past 24 hour(s))  HCG, QUANTITATIVE, PREGNANCY     Status: Abnormal   Collection Time    08/11/12 10:40 PM      Result Value Range   hCG, Beta Chain, [place] [address] (*) <5 mIU/mL  ABO/RH     Status: None   Collection Time    08/11/12 10:40 PM      Result Value Range   ABO/RH(D) A POS    CBC     Status: None   Collection Time    08/11/12 10:40 PM      Result Value Range   WBC 8.9  4.0 - 10.5 K/uL   RBC  4.24  3.87 - 5.11 MIL/uL   Hemoglobin 13.3  12.0 - 15.0 g/dL   HCT 91.4  78.2 - 95.6 %   MCV 90.6  78.0 - 100.0 fL   MCH 31.4  26.0 - 34.0 pg   MCHC 34.6  30.0 - 36.0 g/dL   RDW 21.3  08.6 - 57.8 %   Platelets 252  150 - 400 K/uL  WET PREP, GENITAL     Status: Abnormal   Collection Time    08/12/12  1:00 AM      Result Value Range   Yeast Wet Prep HPF POC NONE SEEN  NONE SEEN   Trich, Wet Prep MODERATE (*) NONE SEEN   Clue Cells Wet Prep HPF POC NONE SEEN  NONE SEEN   WBC, Wet Prep HPF POC FEW (*) NONE SEEN    IMAGING US Ob Comp Less 14 Wks  08/12/2012   *RADIOLOGY REPORT*  Clinical Data: ,6.6 by LMP. Spotting, cramping; ; .  OBSTETRIC <14 WK [place] AND TRANSVAGINAL OB US  Technique: Both transabdominal and transvaginal ultrasound examinations were performed for complete evaluation of the gestation as well as the maternal uterus, adnexal regions, and pelvic cul-de-sac.  Comparison: None.  Findings:  There is a single intrauterine gestation.  Crown-rump length is 11 mm for estimated gestational age of [redacted] weeks 2 days. Fetal heart rate 148 beats per minute.  Moderate subchorionic hemorrhage. This measures 2.9 x 2.8 x 1.3 cm.  Right ovary unremarkable with corpus luteal cyst.  Left ovary not visualized.  No adnexal masses.  No free fluid.  IMPRESSION: 7-week-2-day intrauterine pregnancy.  Fetal heart rate 148 beats per minute.  Moderate sized subchorionic hemorrhage.   Original Report Authenticated By: Charlett Nose, M.D.   US Ob Transvaginal  08/12/2012   *RADIOLOGY REPORT*  Clinical Data: ,6.6 by LMP. Spotting, cramping; ; .  OBSTETRIC <14 WK [place] AND TRANSVAGINAL OB US  Technique: Both transabdominal and transvaginal ultrasound examinations were performed for complete evaluation of the gestation as well as the maternal uterus, adnexal regions, and pelvic cul-de-sac.  Comparison: None.  Findings: There is a single intrauterine gestation.  Crown-rump length is 11 mm for estimated gestational age of [redacted] weeks 2 days. Fetal heart rate 148 beats per minute.  Moderate subchorionic hemorrhage. This measures 2.9 x 2.8 x 1.3 cm.  Right ovary unremarkable with corpus luteal cyst.  Left ovary not visualized.  No adnexal masses.  No free fluid.  IMPRESSION: 7-week-2-day intrauterine pregnancy.  Fetal heart rate 148 beats per minute.  Moderate sized subchorionic hemorrhage.   Original Report Authenticated By: Charlett Nose, M.D.   MAU COURSE  ASSESSMENT 1. Trichomonal vaginitis in pregnancy in first trimester   2. Abdominal pain complicating pregnancy, antepartum   3. Subchorionic hemorrhage in first trimester     PLAN Discharge home in stable condition. Pelvic rest x 1week. GC/CT pending. You have been diagnosed with Trichomonas, a sexually transmitted disease. Your partner needs to be treated as well to avoid reinfecting you. Do not have sex until 1 week after both you and your partner have been treated for  Trichomonas. Always use condoms.     Follow-up Information   Follow up with [facility].      Follow up with [facility] MATERN[facility] ADMISS[facility]. (As needed in emergencies)    Contact information:   [address] [id number] [place] [address] [phone]       Medication List    TAKE  these medications       nitrofurantoin (macrocrystal-monohydrate) 100 MG capsule  Commonly known as:  MACROBID  Take 1 capsule (100 mg total) by mouth 2 (two) times daily.     promethazine 25 MG tablet  Commonly known as:  PHENERGAN  Take 1 tablet (25 mg total) by mouth every 6 (six) hours as needed for nausea.         Wilson, CNM 08/12/2012  1:46 AM

## 2012-08-12 DIAGNOSIS — O2390 Unspecified genitourinary tract infection in pregnancy, unspecified trimester: Secondary | ICD-10-CM

## 2012-08-12 LAB — ABO/RH: ABO/RH(D): O POS

## 2012-08-12 LAB — GC/CHLAMYDIA PROBE AMP
CT Probe RNA: NEGATIVE
GC Probe RNA: NEGATIVE

## 2012-08-12 LAB — WET PREP, GENITAL: Clue Cells Wet Prep HPF POC: NONE SEEN

## 2012-08-12 MED ORDER — ONDANSETRON 8 MG PO TBDP
8.0000 mg | ORAL_TABLET | Freq: Once | ORAL | Status: AC
  Administered 2012-08-12: 8 mg via ORAL
  Filled 2012-08-12: qty 1

## 2012-08-12 MED ORDER — METRONIDAZOLE 500 MG PO TABS
2000.0000 mg | ORAL_TABLET | Freq: Once | ORAL | Status: AC
  Administered 2012-08-12: 2000 mg via ORAL
  Filled 2012-08-12: qty 4

## 2012-08-13 NOTE — MAU Provider Note (Signed)
Attestation of Attending Supervision of Advanced Practitioner (PA/CNM/NP): Evaluation and management procedures were performed by the Advanced Practitioner under my supervision and collaboration.  I have reviewed the Advanced Practitioner's note and chart, and I agree with the management and plan.  Rondarius Kadrmas, MD, FACOG Attending Obstetrician & Gynecologist Faculty Practice, [facility]  

## 2012-08-17 ENCOUNTER — Inpatient Hospital Stay (HOSPITAL_COMMUNITY)
Admission: AD | Admit: 2012-08-17 | Discharge: 2012-08-18 | Disposition: A | Discharge status: Home / Self Care | Payer: BC Managed Care – PPO | Source: Ambulatory Visit | Attending: Obstetrics and Gynecology | Admitting: Obstetrics and Gynecology

## 2012-08-17 ENCOUNTER — Encounter (HOSPITAL_COMMUNITY): Payer: Self-pay | Admitting: *Deleted

## 2012-08-17 DIAGNOSIS — O210 Mild hyperemesis gravidarum: Secondary | ICD-10-CM

## 2012-08-17 DIAGNOSIS — O2390 Unspecified genitourinary tract infection in pregnancy, unspecified trimester: Secondary | ICD-10-CM | POA: Insufficient documentation

## 2012-08-17 DIAGNOSIS — R51 Headache: Secondary | ICD-10-CM | POA: Insufficient documentation

## 2012-08-17 DIAGNOSIS — N390 Urinary tract infection, site not specified: Secondary | ICD-10-CM | POA: Insufficient documentation

## 2012-08-17 DIAGNOSIS — O219 Vomiting of pregnancy, unspecified: Secondary | ICD-10-CM

## 2012-08-17 LAB — URINALYSIS, ROUTINE W REFLEX MICROSCOPIC
Glucose, UA: NEGATIVE mg/dL
Protein, ur: NEGATIVE mg/dL
Specific Gravity, Urine: >1.03 — ABNORMAL HIGH (ref 1.005–1.030)
Urobilinogen, UA: 0.2 mg/dL (ref 0.0–1.0)

## 2012-08-17 LAB — URINE MICROSCOPIC-ADD ON

## 2012-08-17 MED ORDER — PROMETHAZINE HCL 25 MG/ML IJ SOLN
25.0000 mg | Freq: Once | INTRAMUSCULAR | Status: AC
  Administered 2012-08-17: 25 mg via INTRAVENOUS
  Filled 2012-08-17: qty 1

## 2012-08-17 MED ORDER — DEXTROSE 5 % IV SOLN
1.0000 g | Freq: Once | INTRAVENOUS | Status: AC
  Administered 2012-08-17: 1 g via INTRAVENOUS
  Filled 2012-08-17: qty 10

## 2012-08-17 MED ORDER — FAMOTIDINE IN NACL 20-0.9 MG/50ML-% IV SOLN
20.0000 mg | Freq: Once | INTRAVENOUS | Status: AC
  Administered 2012-08-17: 20 mg via INTRAVENOUS
  Filled 2012-08-17: qty 50

## 2012-08-17 MED ORDER — ONDANSETRON HCL 4 MG/2ML IJ SOLN
4.0000 mg | Freq: Once | INTRAMUSCULAR | Status: AC
  Administered 2012-08-17: 4 mg via INTRAVENOUS
  Filled 2012-08-17: qty 2

## 2012-08-17 MED ORDER — DIPHENHYDRAMINE HCL 50 MG/ML IJ SOLN
25.0000 mg | Freq: Once | INTRAMUSCULAR | Status: AC
  Administered 2012-08-17: 25 mg via INTRAVENOUS
  Filled 2012-08-17: qty 1

## 2012-08-17 MED ORDER — DEXAMETHASONE SODIUM PHOSPHATE 10 MG/ML IJ SOLN
10.0000 mg | Freq: Once | INTRAMUSCULAR | Status: AC
  Administered 2012-08-17: 10 mg via INTRAVENOUS
  Filled 2012-08-17: qty 1

## 2012-08-17 MED ORDER — LACTATED RINGERS IV BOLUS (SEPSIS): 1000.0000 mL | Freq: Once | INTRAVENOUS | Status: DC

## 2012-08-17 MED ORDER — DEXTROSE 5 % IV SOLN: 2.0000 g | Freq: Once | INTRAVENOUS | Status: DC

## 2012-08-17 MED ORDER — SODIUM CHLORIDE 0.9 % IV SOLN
INTRAVENOUS | Status: DC
  Administered 2012-08-17: 21:00:00 via INTRAVENOUS

## 2012-08-17 MED ORDER — PROMETHAZINE HCL 25 MG PO TABS: 25.0000 mg | ORAL_TABLET | Freq: Four times a day (QID) | ORAL | Status: DC | PRN

## 2012-08-17 MED ORDER — ONDANSETRON HCL 4 MG PO TABS: 4.0000 mg | ORAL_TABLET | Freq: Four times a day (QID) | ORAL | Status: DC

## 2012-08-17 NOTE — MAU Provider Note (Signed)
History     CSN: [id number]  Arrival date and time: 08/17/12 4098   First Provider Initiated Contact with Patient 08/17/12 2007      Chief Complaint  Patient presents with  . Emesis During Pregnancy   HPI [name] is a 22 y.o. G1P0 at [redacted]w[redacted]d who presents to MAU today with complaint of N/V that is worse since Saturday. The patient states that she has also had diarrhea, acid reflux and headache. The patient states that she was previously diagnosed with UTI and has not been able to keep her antibiotics down recently. She denies dysuria, fever, abdominal pain, dysuria or vaginal bleeding.    OB History   Grav Para Term Preterm Abortions TAB SAB Ect Mult Living   1               Past Medical History  Diagnosis Date  . Hypertension   . Ovarian cyst   . Anxiety   . Depression med made her navel itching and  made her sleepy so she quit taking them  . Chlamydia     History reviewed. No pertinent past surgical history.  Family History  Problem Relation Age of Onset  . Diabetes Mother   . Heart disease Mother   . Hypertension Mother   . Depression Mother   . Stroke Mother   . Arthritis Mother   . Stroke Father   . Asthma Sister   . Kidney disease Brother   . Diabetes Maternal Aunt   . Hypertension Maternal Aunt   . Asthma Maternal Aunt   . Epilepsy Maternal Aunt   . Hypertension Maternal Grandmother   . Dementia Maternal Grandmother   . Heart disease Maternal Grandmother   . Diabetes Paternal Grandmother     History  Substance Use Topics  . Smoking status: Current Every Day Smoker -- 0.25 packs/day for 5 years    Types: Cigarettes  . Smokeless tobacco: Not on file  . Alcohol Use: Yes     Comment: occasionallly    Allergies: No Known Allergies  Prescriptions prior to admission  Medication Sig Dispense Refill  . [DISCONTINUED] promethazine (PHENERGAN) 25 MG tablet Take 1 tablet (25 mg total) by mouth every 6 (six) hours as needed for nausea.  30  tablet  0    Review of Systems  Constitutional: Negative for fever.  Gastrointestinal: Positive for heartburn, nausea, vomiting and diarrhea. Negative for abdominal pain and constipation.  Genitourinary: Negative for dysuria, urgency and frequency.       Neg - vaginal bleeding   Physical Exam   Blood pressure 129/75, pulse 97, temperature 98.5 F (36.9 C), temperature source Oral, resp. rate 16, height 5\' 4"  (1.626 m), weight 252 lb (114.306 kg), last menstrual period 06/24/2012, SpO2 100.00%.  Physical Exam  Constitutional: She is oriented to person, place, and time. She appears well-developed and well-nourished. No distress.  HENT:  Head: Normocephalic and atraumatic.  Cardiovascular: Normal rate, regular rhythm and normal heart sounds.   Respiratory: Effort normal and breath sounds normal. No respiratory distress.  GI: Soft. Bowel sounds are normal. She exhibits no distension and no mass. There is tenderness (mild tenderness to palpation of the epigastric region). There is no rebound and no guarding.  Neurological: She is alert and oriented to person, place, and time.  Skin: Skin is warm and dry. No erythema.  Psychiatric: She has a normal mood and affect.   Results for orders placed during the hospital encounter of 08/17/12 (from  the past 24 hour(s))  URINALYSIS, ROUTINE W REFLEX MICROSCOPIC     Status: Abnormal   Collection Time    08/17/12  7:15 PM      Result Value Range   Color, Urine YELLOW  YELLOW   APPearance CLEAR  CLEAR   Specific Gravity, Urine >1.030 (*) 1.005 - 1.030   pH 6.0  5.0 - 8.0   Glucose, UA NEGATIVE  NEGATIVE mg/dL   Hgb urine dipstick NEGATIVE  NEGATIVE   Bilirubin Urine NEGATIVE  NEGATIVE   Ketones, ur 15 (*) NEGATIVE mg/dL   Protein, ur NEGATIVE  NEGATIVE mg/dL   Urobilinogen, UA 0.2  0.0 - 1.0 mg/dL   Nitrite POSITIVE (*) NEGATIVE   Leukocytes, UA MODERATE (*) NEGATIVE  URINE MICROSCOPIC-ADD ON     Status: Abnormal   Collection Time     08/17/12  7:15 PM      Result Value Range   Squamous Epithelial / LPF FEW (*) RARE   WBC, UA 21-50  <3 WBC/hpf   RBC / HPF 7-10  <3 RBC/hpf   Bacteria, UA MANY (*) RARE    MAU Course  Procedures None  MDM UA shows dehydration and UTI 1 L NS bolus with 4 mg IV Zofran, 20 mg IV Pepcid and 1 g Rocephin Patient continues to report nausea. No vomiting since IV started. Patient now reports 7/10 headache pain 1 L IV LR with phenergan infusion, 25 mg Benadryl, 10 mg Decadron given Patient reports little improvement in symptoms, but has not had vomiting and states that she never takes pain medications for her headaches. Will try PO intake here Patient able to tolerate PO in MAU.  Assessment and Plan  A: UTI in pregnancy Nausea and vomiting in pregnancy prior to [redacted] weeks gestation Headache  P: Discharge home Rx for Zofran and refill for phenergan sent to patient's pharmacy Patient advised that phenergan can be used as a suppository as well Patient encouraged to finish antibiotics previously prescribed for UTI Patient encouraged to increase PO hydration as tolerated Hyperemesis diet information given on AVS Patient may return to MAU as needed  Freddi Starr, PA-C  08/17/2012, 11:57 PM

## 2012-08-17 NOTE — MAU Note (Signed)
Patient states she has had nausea and vomiting for a while but has not been able to keep anything down since Saturday. States she has some pressure in epigastric area with vomiting. Denies bleeding.

## 2012-08-18 ENCOUNTER — Encounter (HOSPITAL_COMMUNITY): Payer: Self-pay | Admitting: *Deleted

## 2012-08-18 ENCOUNTER — Inpatient Hospital Stay (HOSPITAL_COMMUNITY)
Admission: AD | Admit: 2012-08-18 | Discharge: 2012-08-18 | Disposition: A | Discharge status: Home / Self Care | Payer: Medicaid Other | Source: Ambulatory Visit | Attending: Family Medicine | Admitting: Family Medicine

## 2012-08-18 DIAGNOSIS — R51 Headache: Secondary | ICD-10-CM | POA: Insufficient documentation

## 2012-08-18 DIAGNOSIS — O210 Mild hyperemesis gravidarum: Secondary | ICD-10-CM | POA: Insufficient documentation

## 2012-08-18 MED ORDER — FAMOTIDINE IN NACL 20-0.9 MG/50ML-% IV SOLN: 20.0000 mg | Freq: Once | INTRAVENOUS | Status: DC

## 2012-08-18 MED ORDER — METOCLOPRAMIDE HCL 5 MG/ML IJ SOLN: 10.0000 mg | Freq: Once | INTRAMUSCULAR | Status: DC

## 2012-08-18 MED ORDER — DEXTROSE 5 % IN LACTATED RINGERS IV BOLUS: 1000.0000 mL | Freq: Once | INTRAVENOUS | Status: DC

## 2012-08-18 NOTE — MAU Provider Note (Signed)
History     CSN: [id number]  Arrival date and time: 08/18/12 2154   First Provider Initiated Contact with Patient 08/18/12 2240      Chief Complaint  Patient presents with  . Emesis During Pregnancy   HPI [name] is a 22 y.o. G1P0 at [redacted]w[redacted]d who presents to MAU today with complaint of headache and N/V. The patient was seen last night and given IVF with Zofran and Phenergan. She reported little improvement in symptoms, but had no additional episodes of vomiting here. She was given Rx for Zofran and also told that Phenergan could be taken as a suppository if N/V was severe and she could not tolerate PO intake. The patient states that she took phenergan today PO without relief of nausea and tried her cousins Zofran also without relief. She did not fill her Rx for Zofran as she could not afford it. She did not try the phenergan suppository. She states 2 episodes of emesis today with some blood. She has been having heartburn and headache today as well. She has not taken anything for either of these conditions at home. She is nauseous now, but has not vomited since 1400 today. She ate tacos prior to vomiting. She has not yet decided on an OB provider for prenatal care. She did not resume the antibiotics for UTI as instructed yesterday.   OB History   Grav Para Term Preterm Abortions TAB SAB Ect Mult Living   1               Past Medical History  Diagnosis Date  . Hypertension   . Ovarian cyst   . Anxiety   . Depression med made her navel itching and  made her sleepy so she quit taking them  . Chlamydia     No past surgical history on file.  Family History  Problem Relation Age of Onset  . Diabetes Mother   . Heart disease Mother   . Hypertension Mother   . Depression Mother   . Stroke Mother   . Arthritis Mother   . Stroke Father   . Asthma Sister   . Kidney disease Brother   . Diabetes Maternal Aunt   . Hypertension Maternal Aunt   . Asthma Maternal Aunt   .  Epilepsy Maternal Aunt   . Hypertension Maternal Grandmother   . Dementia Maternal Grandmother   . Heart disease Maternal Grandmother   . Diabetes Paternal Grandmother     History  Substance Use Topics  . Smoking status: Current Every Day Smoker -- 0.25 packs/day for 5 years    Types: Cigarettes  . Smokeless tobacco: Not on file  . Alcohol Use: Yes     Comment: occasionallly    Allergies: No Known Allergies  No prescriptions prior to admission    Review of Systems  Constitutional: Negative for fever and malaise/fatigue.  Gastrointestinal: Positive for nausea and vomiting. Negative for abdominal pain.  Genitourinary: Negative for dysuria, urgency and frequency.       Neg - vaginal bleeding, discharge   Physical Exam   Blood pressure 126/82, pulse 88, temperature 98.3 F (36.8 C), temperature source Oral, resp. rate 18, height 5\' 4"  (1.626 m), weight 256 lb 3.2 oz (116.212 kg), last menstrual period 06/24/2012.  Physical Exam  Constitutional: She is oriented to person, place, and time. She appears well-developed and well-nourished. No distress.  HENT:  Head: Normocephalic and atraumatic.  Cardiovascular: Normal rate, regular rhythm and normal heart sounds.  Respiratory: Effort normal and breath sounds normal. No respiratory distress.  GI: Soft. Bowel sounds are normal. She exhibits no distension and no mass. There is tenderness (mild tenderness to palpation of the upper abdomen). There is no rebound and no guarding.  Neurological: She is alert and oriented to person, place, and time.  Skin: Skin is warm and dry. No erythema.  Psychiatric: She has a normal mood and affect.   No results found for this or any previous visit (from the past 24 hour(s)).   MAU Course  Procedures None  MDM Ordered 1 L D5LR with Reglan and Pepcid.  Discussed with patient the need for better diet choices with consistent N/V as symptoms may not be able to be 100% controlled in early pregnancy.   Patient appears to be non-compliant with medications and diet recommendations from previous visits and is not very receptive to information again today.   Assessment and Plan  Prior to receiving IV medications. Patient left AMA without informing the staff.   Freddi Starr, PA-C  08/18/2012, 11:32 PM

## 2012-08-18 NOTE — MAU Provider Note (Signed)
Attestation of Attending Supervision of Advanced Practitioner (CNM/NP): Evaluation and management procedures were performed by the Advanced Practitioner under my supervision and collaboration.  I have reviewed the Advanced Practitioner's note and chart, and I agree with the management and plan.  Deston Bilyeu 08/18/2012 8:00 AM

## 2012-08-18 NOTE — MAU Note (Signed)
Pt seen in MAU last night for vomiting, IVF rec'd and a prescription for zofran.  Was unable to fill prescription due to expense.  Pt has phenergan at home-not helping.  Vomiting x 2 today with blood, burning in chest.

## 2012-08-18 NOTE — MAU Note (Signed)
PT SAYS SHE WAS HERE ON Monday NIGHT-   WE GAVE RX FOR ZOFRAN AND PHENERGAN-  HAS USED TODAY BUT  FEELS LIKE THEY DO NOT WORK.  SHE ATE LAST  AT 2PM   -  TACOS- AND VOMITED.   SHE DRANK LAST 2PM     ICY-  VOMITED  -

## 2012-08-19 ENCOUNTER — Telehealth: Payer: Self-pay | Admitting: Obstetrics and Gynecology

## 2012-08-19 ENCOUNTER — Other Ambulatory Visit: Payer: Self-pay | Admitting: Obstetrics and Gynecology

## 2012-08-19 LAB — URINE CULTURE: Colony Count: >=100000

## 2012-08-19 MED ORDER — CEPHALEXIN 500 MG PO CAPS: 500.0000 mg | ORAL_CAPSULE | Freq: Four times a day (QID) | ORAL | Status: DC

## 2012-08-19 NOTE — MAU Provider Note (Signed)
Chart reviewed and agree with management and plan.  

## 2012-08-19 NOTE — Telephone Encounter (Addendum)
Message copied by Toula Moos on Wed Aug 19, 2012  5:15 PM   ------ Called patient; no answer. Left message to call clinic back for results.       Message from: CONSTANT, PEGGY      Created: Wed Aug 19, 2012 12:54 PM       Please inform patient of positive UTI. Keflex has been e-prescribed to her Chief Operating Officer ------

## 2012-08-20 NOTE — Telephone Encounter (Signed)
Called patient, went straight to voicemail- left message we are calling in regards to her most recent urine culture that came back for a UTI and we have called in an antibiotic to treat this to her [facility]- should she have any other questions or concerns to call us back,.

## 2012-08-25 ENCOUNTER — Encounter (HOSPITAL_COMMUNITY): Payer: Self-pay

## 2012-08-25 ENCOUNTER — Inpatient Hospital Stay (HOSPITAL_COMMUNITY)
Admission: AD | Admit: 2012-08-25 | Discharge: 2012-08-25 | Disposition: A | Discharge status: Home / Self Care | Payer: BC Managed Care – PPO | Source: Ambulatory Visit | Attending: Obstetrics & Gynecology | Admitting: Obstetrics & Gynecology

## 2012-08-25 DIAGNOSIS — O219 Vomiting of pregnancy, unspecified: Secondary | ICD-10-CM

## 2012-08-25 DIAGNOSIS — O2341 Unspecified infection of urinary tract in pregnancy, first trimester: Secondary | ICD-10-CM

## 2012-08-25 DIAGNOSIS — O210 Mild hyperemesis gravidarum: Secondary | ICD-10-CM | POA: Insufficient documentation

## 2012-08-25 DIAGNOSIS — R109 Unspecified abdominal pain: Secondary | ICD-10-CM | POA: Insufficient documentation

## 2012-08-25 DIAGNOSIS — O2390 Unspecified genitourinary tract infection in pregnancy, unspecified trimester: Secondary | ICD-10-CM | POA: Insufficient documentation

## 2012-08-25 DIAGNOSIS — O26891 Other specified pregnancy related conditions, first trimester: Secondary | ICD-10-CM

## 2012-08-25 DIAGNOSIS — N390 Urinary tract infection, site not specified: Secondary | ICD-10-CM | POA: Insufficient documentation

## 2012-08-25 DIAGNOSIS — M549 Dorsalgia, unspecified: Secondary | ICD-10-CM

## 2012-08-25 LAB — URINALYSIS, ROUTINE W REFLEX MICROSCOPIC
Ketones, ur: 15 mg/dL — AB
Leukocytes, UA: NEGATIVE
Nitrite: NEGATIVE
Protein, ur: NEGATIVE mg/dL
Urobilinogen, UA: 0.2 mg/dL (ref 0.0–1.0)

## 2012-08-25 MED ORDER — PROMETHAZINE HCL 25 MG RE SUPP: 25.0000 mg | Freq: Four times a day (QID) | RECTAL | Status: DC | PRN

## 2012-08-25 MED ORDER — ACETAMINOPHEN 325 MG PO TABS
650.0000 mg | ORAL_TABLET | ORAL | Status: DC
  Filled 2012-08-25: qty 2

## 2012-08-25 MED ORDER — CEFTRIAXONE SODIUM 1 G IJ SOLR
1.0000 g | INTRAMUSCULAR | Status: AC
  Administered 2012-08-25: 1 g via INTRAMUSCULAR
  Filled 2012-08-25: qty 10

## 2012-08-25 MED ORDER — GLYCOPYRROLATE 1 MG PO TABS: 2.0000 mg | ORAL_TABLET | Freq: Three times a day (TID) | ORAL | Status: DC

## 2012-08-25 MED ORDER — GLYCOPYRROLATE 1 MG PO TABS
2.0000 mg | ORAL_TABLET | ORAL | Status: AC
  Administered 2012-08-25: 2 mg via ORAL
  Filled 2012-08-25: qty 2

## 2012-08-25 MED ORDER — PROMETHAZINE HCL 25 MG RE SUPP
25.0000 mg | RECTAL | Status: AC
  Administered 2012-08-25: 25 mg via RECTAL
  Filled 2012-08-25: qty 1

## 2012-08-25 NOTE — MAU Note (Signed)
N/v & weakness not improved since last MAU visit on 7/1. Abdominal cramping present x 3 days. Denies vaginal bleeding. Denies vaginal discharge. Unable to keep down medication for UTI that was diagnosed last week. Hasn't been able to void since late last night.

## 2012-08-25 NOTE — MAU Provider Note (Signed)
Chief Complaint: Back Pain, Headache and Fatigue   First Provider Initiated Contact with Patient 08/25/12 1953     SUBJECTIVE HPI: Misty Walsh is a 22 y.o. G1P0 at [redacted]w[redacted]d by LMP who presents to maternity admissions reporting left flank and back pain today and headaches for last few days, none currently.  She was diagnosed with UTI on 6/30 and was seen on 7/1 for n/v and has continued to vomit daily.  She has not kept down her prescribed abx for UTI.  She denies LOF, vaginal bleeding, vaginal itching/burning, urinary symptoms, h/a, dizziness, n/v, or fever/chills.     Past Medical History  Diagnosis Date  . Hypertension   . Ovarian cyst   . Anxiety   . Depression med made her navel itching and  made her sleepy so she quit taking them  . Chlamydia    Past Surgical History  Procedure Laterality Date  . No past surgeries     History   Social History  . Marital Status: Single    Spouse Name: N/A    Number of Children: N/A  . Years of Education: N/A   Occupational History  . Not on file.   Social History Main Topics  . Smoking status: Current Every Day Smoker -- 0.25 packs/day for 5 years    Types: Cigarettes  . Smokeless tobacco: Not on file  . Alcohol Use: No     Comment: occasionallly  . Drug Use: No  . Sexually Active: Yes     Comment: sometimes   Other Topics Concern  . Not on file   Social History Narrative  . No narrative on file   No current facility-administered medications on file prior to encounter.   No current outpatient prescriptions on file prior to encounter.   No Known Allergies  ROS: Pertinent items in HPI  OBJECTIVE Blood pressure 97/71, pulse 104, temperature 99.1 F (37.3 C), temperature source Oral, resp. rate 18, height 5\' 4"  (1.626 m), weight 112.129 kg (247 lb 3.2 oz), last menstrual period 06/24/2012. GENERAL: Well-developed, well-nourished female in no acute distress.  HEENT: Normocephalic HEART: normal rate RESP: normal  effort ABDOMEN: Soft, non-tender EXTREMITIES: Nontender, no edema NEURO: Alert and oriented   LAB RESULTS Results for orders placed during the hospital encounter of 08/25/12 (from the past 24 hour(s))  URINALYSIS, ROUTINE W REFLEX MICROSCOPIC     Status: Abnormal   Collection Time    08/25/12  7:44 PM      Result Value Range   Color, Urine YELLOW  YELLOW   APPearance CLEAR  CLEAR   Specific Gravity, Urine >1.030 (*) 1.005 - 1.030   pH 5.5  5.0 - 8.0   Glucose, UA NEGATIVE  NEGATIVE mg/dL   Hgb urine dipstick NEGATIVE  NEGATIVE   Bilirubin Urine NEGATIVE  NEGATIVE   Ketones, ur 15 (*) NEGATIVE mg/dL   Protein, ur NEGATIVE  NEGATIVE mg/dL   Urobilinogen, UA 0.2  0.0 - 1.0 mg/dL   Nitrite NEGATIVE  NEGATIVE   Leukocytes, UA NEGATIVE  NEGATIVE     ASSESSMENT 1. UTI in pregnancy, antepartum, first trimester   2. Back pain complicating pregnancy in first trimester   3. Headache in pregnancy, antepartum, first trimester   4. Nausea/vomiting in pregnancy     PLAN Phenergan suppository, Robinul, and Tylenol PO Called Dr Debroah Loop regarding pt inability to take PO abx Rocephin 1g IM then D/C home to continue oral abx if possible Phenergan suppository and Robinul prescribed Return to MAU  as needed    Medication List    ASK your doctor about these medications       cephALEXin 500 MG capsule  Commonly known as:  KEFLEX  Take 1 capsule (500 mg total) by mouth 4 (four) times daily.     promethazine 25 MG tablet  Commonly known as:  PHENERGAN  Take 25 mg by mouth every 6 (six) hours as needed for nausea.         Sharen Counter Certified Nurse-Midwife 08/25/2012  8:20 PM

## 2012-08-28 ENCOUNTER — Encounter (HOSPITAL_COMMUNITY): Payer: Self-pay

## 2012-08-28 ENCOUNTER — Inpatient Hospital Stay (HOSPITAL_COMMUNITY)
Admission: AD | Admit: 2012-08-28 | Discharge: 2012-08-29 | Discharge status: Against Medical Advice | Payer: BC Managed Care – PPO | Source: Ambulatory Visit | Attending: Obstetrics & Gynecology | Admitting: Obstetrics & Gynecology

## 2012-08-28 DIAGNOSIS — O219 Vomiting of pregnancy, unspecified: Secondary | ICD-10-CM

## 2012-08-28 DIAGNOSIS — O210 Mild hyperemesis gravidarum: Secondary | ICD-10-CM | POA: Insufficient documentation

## 2012-08-28 MED ORDER — ONDANSETRON 8 MG/NS 50 ML IVPB
8.0000 mg | Freq: Once | INTRAVENOUS | Status: AC
  Administered 2012-08-28: 8 mg via INTRAVENOUS
  Filled 2012-08-28: qty 8

## 2012-08-28 MED ORDER — ONDANSETRON HCL 4 MG/2ML IJ SOLN: 8.0000 mg | Freq: Once | INTRAMUSCULAR | Status: DC

## 2012-08-28 MED ORDER — PROMETHAZINE HCL 25 MG/ML IJ SOLN
25.0000 mg | Freq: Once | INTRAVENOUS | Status: AC
  Administered 2012-08-28: 25 mg via INTRAVENOUS
  Filled 2012-08-28: qty 1

## 2012-08-28 NOTE — MAU Note (Signed)
Patient's cousin Doreene Burke called asked for patient to call her, went in patient's room patient asleep did not awaken patient.

## 2012-08-28 NOTE — MAU Note (Signed)
Patient states was seen in MAU for vomiting, dizziness, back pain, UTI has not been able to get any of her medications filled, she is vomiting blood, dizzy and in pain.

## 2012-08-28 NOTE — MAU Provider Note (Signed)
History     CSN: [id number]  Arrival date and time: 08/28/12 2115   None     Chief Complaint  Patient presents with  . Emesis During Pregnancy  . Back Pain  . Dizziness   Back Pain    Misty Walsh is a 22 y.o. G1P0 at [redacted]w[redacted]d who presents today with nausea and vomiting. She is upset because she cannot afford the prescription medications we have given her. She states that they cost $150, and she does not understand why we keep sending her home. She cannot sleep or eat or "do anything" because she vomiting all the time.   She is unable to leave a urine sample.   Past Medical History  Diagnosis Date  . Hypertension   . Ovarian cyst   . Anxiety   . Depression med made her navel itching and  made her sleepy so she quit taking them  . Chlamydia     Past Surgical History  Procedure Laterality Date  . No past surgeries      Family History  Problem Relation Age of Onset  . Diabetes Mother   . Heart disease Mother   . Hypertension Mother   . Depression Mother   . Stroke Mother   . Arthritis Mother   . Stroke Father   . Asthma Sister   . Kidney disease Brother     genetic condition  . Diabetes Maternal Aunt   . Hypertension Maternal Aunt   . Asthma Maternal Aunt   . Epilepsy Maternal Aunt   . Hypertension Maternal Grandmother   . Dementia Maternal Grandmother   . Heart disease Maternal Grandmother   . Diabetes Paternal Grandmother     History  Substance Use Topics  . Smoking status: Current Every Day Smoker -- 0.25 packs/day for 5 years    Types: Cigarettes  . Smokeless tobacco: Not on file  . Alcohol Use: No     Comment: occasionallly    Allergies: No Known Allergies  Prescriptions prior to admission  Medication Sig Dispense Refill  . promethazine (PHENERGAN) 25 MG suppository Place 1 suppository (25 mg total) rectally every 6 (six) hours as needed for nausea.  12 each  0  . cephALEXin (KEFLEX) 500 MG capsule Take 1 capsule (500 mg total) by mouth 4  (four) times daily.  28 capsule  0  . glycopyrrolate (ROBINUL) 1 MG tablet Take 2 tablets (2 mg total) by mouth 3 (three) times daily.  90 tablet  3    Review of Systems  Musculoskeletal: Positive for back pain.   Physical Exam   Blood pressure 127/55, pulse 93, temperature 98.6 F (37 C), temperature source Oral, resp. rate 18, height 5\' 4"  (1.626 m), weight 110.496 kg (243 lb 9.6 oz), last menstrual period 06/24/2012, SpO2 99.00%.  Physical Exam  Nursing note and vitals reviewed. Constitutional: She is oriented to person, place, and time. She appears well-developed and well-nourished. No distress.  Cardiovascular: Normal rate.   Respiratory: Effort normal.  GI: Soft.  Neurological: She is alert and oriented to person, place, and time.  Skin: Skin is warm and dry.  Psychiatric: She has a normal mood and affect.    MAU Course  Procedures  2350: Patient has had more than half of the phenergan infusion. She states she is feeling a little better, but mostly sleepy.   0024: T. Stallings in to see the patient because her cousin has called the unit >5 times demanding private health information about the  patient. She had been told that we cannot provide information over the phone. Kenney Houseman wanted to speak with the patient and obtain permission to talk with the family. While in the room with the patient she reports that patient stated "I am so sick of everything, and I want to cut myself open and take the baby out."  ACT team notified to come and evaluate the patient.   42: BH contacted, and there is no one on call for the ACT team tonight. They cannot send anyone to evaluate the patient.   0140: Spoke with the patient. She states that she "doesn't want to cut the baby out", but that sometimes she "wishes she could cut the baby out", and she is upset that Tanya relayed the information to me about her desire to harm the fetus. She was informed that for feelings of self harm we need to have  her seen by psych or the ACT team, and that we need to transfer her to [facility] She is refusing transfer at this time.   0212: Patient states that she will sign out AMA v going to [facility] for evaluation. I spoke with the boyfriend at length, and he states that he feels safe having her at home, and that he will bring her to [facility] if he notices anything that concerns him.   Assessment and Plan   1. Nausea and vomiting in pregnancy prior to [redacted] weeks gestation      Medication List    STOP taking these medications       cephALEXin 500 MG capsule  Commonly known as:  KEFLEX     promethazine 25 MG suppository  Commonly known as:  PHENERGAN  Replaced by:  promethazine 12.5 MG tablet      TAKE these medications       Doxylamine-Pyridoxine 10-10 MG Tbec  Commonly known as:  DICLEGIS  Take 2 tablets by mouth at bedtime. If sx persist add 1 tablet every morning starting on day 3. If symptoms persist add 1 tablet every afternoon on day 4.     glycopyrrolate 1 MG tablet  Commonly known as:  ROBINUL  Take 2 tablets (2 mg total) by mouth 3 (three) times daily.     ondansetron 8 MG disintegrating tablet  Commonly known as:  ZOFRAN ODT  Take 1 tablet (8 mg total) by mouth every 8 (eight) hours as needed for nausea.     promethazine 12.5 MG tablet  Commonly known as:  PHENERGAN  Take 2 tablets (25 mg total) by mouth every 6 (six) hours as needed for nausea.       Danger signs discussed with the boyfriend at length. He will take her to [facility] for any concerning behaviors Patient signed out AMA  Tawnya Crook 08/28/2012, 10:13 PM

## 2012-08-28 NOTE — MAU Note (Signed)
Patient unable to supply urine sample.

## 2012-08-29 DIAGNOSIS — O210 Mild hyperemesis gravidarum: Secondary | ICD-10-CM

## 2012-08-29 MED ORDER — ONDANSETRON 8 MG PO TBDP: 8.0000 mg | ORAL_TABLET | Freq: Three times a day (TID) | ORAL | Status: DC | PRN

## 2012-08-29 MED ORDER — PROMETHAZINE HCL 25 MG RE SUPP: 25.0000 mg | Freq: Four times a day (QID) | RECTAL | Status: DC | PRN

## 2012-08-29 MED ORDER — DOXYLAMINE-PYRIDOXINE 10-10 MG PO TBEC: 2.0000 | DELAYED_RELEASE_TABLET | Freq: Every day | ORAL | Status: DC

## 2012-08-29 MED ORDER — LACTATED RINGERS IV BOLUS (SEPSIS)
1000.0000 mL | Freq: Once | INTRAVENOUS | Status: AC
  Administered 2012-08-29: 1000 mL via INTRAVENOUS

## 2012-08-29 MED ORDER — GLYCOPYRROLATE 1 MG PO TABS: 2.0000 mg | ORAL_TABLET | Freq: Three times a day (TID) | ORAL | Status: DC

## 2012-08-29 MED ORDER — PROMETHAZINE HCL 12.5 MG PO TABS: 25.0000 mg | ORAL_TABLET | Freq: Four times a day (QID) | ORAL | Status: DC | PRN

## 2012-08-29 NOTE — MAU Note (Signed)
Paged ACT team.

## 2012-08-29 NOTE — MAU Provider Note (Signed)
Attestation of Attending Supervision of Advanced Practitioner (PA/CNM/NP): Evaluation and management procedures were performed by the Advanced Practitioner under my supervision and collaboration.  I have reviewed the Advanced Practitioner's note and chart, and I agree with the management and plan.  Jobin Montelongo, MD, FACOG Attending Obstetrician & Gynecologist Faculty Practice, [facility]  

## 2012-08-29 NOTE — MAU Note (Signed)
Misty Stain RN House Coverage went into see patient to ask if it was all right to speak to her cousin Secondary school teacher. Upon talking with patient she stated to her that she could not live like this and would take a knife and cut this baby out. The patient was informed that we were here to take care of her mentally and physically and with the statement she had made that her provider would be made aware and we would proceed accordingly.

## 2012-08-29 NOTE — MAU Note (Signed)
Called back to [facility] spoke to [place] was told no one would be able to come over to [facility] to evaluate patient notified Thressa Sheller CNM

## 2012-08-29 NOTE — MAU Note (Signed)
Spoke with Ala Dach at [facility] regarding need for patient to be evaluated by ACT team was told no one on call for ACT team will discuss with oncoming staff and call back.

## 2012-09-05 ENCOUNTER — Inpatient Hospital Stay (HOSPITAL_COMMUNITY)
Admission: AD | Admit: 2012-09-05 | Discharge: 2012-09-07 | DRG: 886 | Disposition: A | Discharge status: Home / Self Care | Payer: BC Managed Care – PPO | Source: Ambulatory Visit | Attending: Obstetrics and Gynecology | Admitting: Obstetrics and Gynecology

## 2012-09-05 ENCOUNTER — Encounter (HOSPITAL_COMMUNITY): Payer: Self-pay | Admitting: *Deleted

## 2012-09-05 DIAGNOSIS — E876 Hypokalemia: Secondary | ICD-10-CM | POA: Diagnosis present

## 2012-09-05 DIAGNOSIS — O210 Mild hyperemesis gravidarum: Secondary | ICD-10-CM

## 2012-09-05 DIAGNOSIS — O211 Hyperemesis gravidarum with metabolic disturbance: Secondary | ICD-10-CM | POA: Diagnosis present

## 2012-09-05 LAB — URINE MICROSCOPIC-ADD ON

## 2012-09-05 LAB — URINALYSIS, ROUTINE W REFLEX MICROSCOPIC
Glucose, UA: NEGATIVE mg/dL
Leukocytes, UA: NEGATIVE
Protein, ur: 30 mg/dL — AB
Specific Gravity, Urine: >1.03 — ABNORMAL HIGH (ref 1.005–1.030)
Urobilinogen, UA: 1 mg/dL (ref 0.0–1.0)

## 2012-09-05 LAB — TSH: TSH: 1.807 u[IU]/mL (ref 0.350–4.500)

## 2012-09-05 MED ORDER — SODIUM CHLORIDE 0.9 % IV SOLN
25.0000 mg | INTRAVENOUS | Status: DC
  Administered 2012-09-06 – 2012-09-07 (×2): 25 mg via INTRAVENOUS
  Filled 2012-09-05 (×2): qty 1

## 2012-09-05 MED ORDER — ONDANSETRON HCL 4 MG PO TABS: 4.0000 mg | ORAL_TABLET | Freq: Four times a day (QID) | ORAL | Status: DC | PRN

## 2012-09-05 MED ORDER — PRENATAL MULTIVITAMIN CH: 1.0000 | ORAL_TABLET | Freq: Every day | ORAL | Status: DC

## 2012-09-05 MED ORDER — BIOTENE DRY MOUTH MT LIQD
15.0000 mL | OROMUCOSAL | Status: DC | PRN
  Administered 2012-09-06: 15 mL via OROMUCOSAL

## 2012-09-05 MED ORDER — SODIUM CHLORIDE 0.9 % IV SOLN
INTRAVENOUS | Status: DC
  Administered 2012-09-05 – 2012-09-06 (×2): via INTRAVENOUS
  Filled 2012-09-05 (×2): qty 1000

## 2012-09-05 MED ORDER — SODIUM CHLORIDE 0.9 % IV SOLN
25.0000 mg | INTRAVENOUS | Status: DC
  Administered 2012-09-05: 25 mg via INTRAVENOUS
  Filled 2012-09-05: qty 1

## 2012-09-05 MED ORDER — PYRIDOXINE HCL 100 MG/ML IJ SOLN
100.0000 mg | Freq: Every day | INTRAMUSCULAR | Status: DC
  Administered 2012-09-05 – 2012-09-06 (×2): 100 mg via INTRAVENOUS
  Filled 2012-09-05 (×3): qty 1

## 2012-09-05 MED ORDER — ONDANSETRON HCL 4 MG/2ML IJ SOLN
4.0000 mg | Freq: Four times a day (QID) | INTRAMUSCULAR | Status: DC | PRN
  Administered 2012-09-05 – 2012-09-06 (×2): 4 mg via INTRAVENOUS
  Filled 2012-09-05 (×2): qty 2

## 2012-09-05 NOTE — H&P (Signed)
  See MAU note by Joseph Berkshire

## 2012-09-05 NOTE — MAU Note (Signed)
Pt states she took a phenergan suppository last night but it did not help.  Has not taken any meds today.

## 2012-09-05 NOTE — MAU Note (Signed)
Misty Walsh is here with complaints of N/V. She has vomited everyday, all day throughout her pregnancy. She is [redacted]w[redacted]d. She has tried several medications that were prescribed to her and nothing has helped.

## 2012-09-05 NOTE — MAU Provider Note (Signed)
Attestation of Attending Supervision of Advanced Practitioner (CNM/NP): Evaluation and management procedures were performed by the Advanced Practitioner under my supervision and collaboration.  I have reviewed the Advanced Practitioner's note and chart, and I agree with the management and plan.  Mouhamad Teed 09/05/2012 12:42 PM

## 2012-09-05 NOTE — MAU Provider Note (Signed)
History     CSN: [id number]  Arrival date and time: 09/05/12 1128   First Provider Initiated Contact with Patient 09/05/12 1200      Chief Complaint  Patient presents with  . Nausea  . Emesis During Pregnancy   HPI [name] is a 22 y.o. G1P0 at [redacted]w[redacted]d who presents to MAU today with complaint of excessive N/V. The patient has been seen for this numerous times this pregnancy and was previously non-compliant with home medications. She has tried Phenergan, reglan, robinul, zofran and diclegis without relief. Today she states that she has not eaten in 6 days. She denies diarrhea. She has had subjective fever, lower abdominal pain that she rates at 10/10 now and spotting x a few days. The patient has a known subchorionic hemorrhage and confirmed IUP on previous US. She denies recent intercourse. She has headache. She has lost ~ 8  Lbs since 08/28/12 and much more since first visit this pregnancy.   OB History   Grav Para Term Preterm Abortions TAB SAB Ect Mult Living   1               Past Medical History  Diagnosis Date  . Hypertension   . Ovarian cyst   . Anxiety   . Depression med made her navel itching and  made her sleepy so she quit taking them  . Chlamydia     Past Surgical History  Procedure Laterality Date  . No past surgeries      Family History  Problem Relation Age of Onset  . Diabetes Mother   . Heart disease Mother   . Hypertension Mother   . Depression Mother   . Stroke Mother   . Arthritis Mother   . Stroke Father   . Asthma Sister   . Kidney disease Brother     genetic condition  . Diabetes Maternal Aunt   . Hypertension Maternal Aunt   . Asthma Maternal Aunt   . Epilepsy Maternal Aunt   . Hypertension Maternal Grandmother   . Dementia Maternal Grandmother   . Heart disease Maternal Grandmother   . Diabetes Paternal Grandmother     History  Substance Use Topics  . Smoking status: Current Every Day Smoker -- 0.25 packs/day for 5 years     Types: Cigarettes  . Smokeless tobacco: Not on file  . Alcohol Use: No     Comment: occasionallly    Allergies: No Known Allergies  Prescriptions prior to admission  Medication Sig Dispense Refill  . Doxylamine-Pyridoxine (DICLEGIS) 10-10 MG TBEC Take 2 tablets by mouth at bedtime. If sx persist add 1 tablet every morning starting on day 3. If symptoms persist add 1 tablet every afternoon on day 4.  90 tablet  0  . glycopyrrolate (ROBINUL) 1 MG tablet Take 2 tablets (2 mg total) by mouth 3 (three) times daily.  90 tablet  0  . ondansetron (ZOFRAN ODT) 8 MG disintegrating tablet Take 1 tablet (8 mg total) by mouth every 8 (eight) hours as needed for nausea.  30 tablet  0  . promethazine (PHENERGAN) 12.5 MG tablet Take 2 tablets (25 mg total) by mouth every 6 (six) hours as needed for nausea.  30 tablet  0  . promethazine (PHENERGAN) 25 MG suppository Place 1 suppository (25 mg total) rectally every 6 (six) hours as needed for nausea.  12 each  0    Review of Systems  Constitutional: Positive for fever, chills and malaise/fatigue.  Gastrointestinal:  Positive for nausea, vomiting and abdominal pain. Negative for diarrhea and constipation.  Genitourinary: Negative for dysuria, urgency and frequency.       + vaginal bleeding Neg - vaginal discharge  Neurological: Positive for dizziness, weakness and headaches. Negative for loss of consciousness.   Physical Exam   Blood pressure 128/86, pulse 107, temperature 98.2 F (36.8 C), temperature source Oral, resp. rate 18, height 5\' 4"  (1.626 m), weight 235 lb (106.595 kg), last menstrual period 06/24/2012.  Physical Exam  Constitutional: She is oriented to person, place, and time. She appears well-developed and well-nourished. She appears distressed (appears very uncomfortable).  HENT:  Head: Normocephalic and atraumatic.  Cardiovascular: Normal rate, regular rhythm and normal heart sounds.   Respiratory: Effort normal and breath sounds  normal. No respiratory distress.  GI: Soft. Bowel sounds are normal. She exhibits no distension and no mass. There is tenderness (moderate diffuse tenderness to palpation of the abdomen). There is no rebound and no guarding.  Neurological: She is alert and oriented to person, place, and time.  Skin: Skin is warm and dry. No erythema.  Psychiatric: She has a normal mood and affect.   No results found for this or any previous visit (from the past 24 hour(s)).  MAU Course  Procedures None  MDM FHT - 125 lbs Discussed patient with Dr. Jolayne Panther. She will come to MAU to see patient. Patient will be admitted to [facility] for management of hyperemsis  Assessment and Plan  A: Hyperemesis gravidarum  P: Admit to Women's unit for further management of symptoms  Freddi Starr, PA-C  09/05/2012, 12:00 PM

## 2012-09-05 NOTE — MAU Note (Signed)
Attempted to doppler FHT's, pt states she cannot tolerate me touching her abdomen.

## 2012-09-06 DIAGNOSIS — O211 Hyperemesis gravidarum with metabolic disturbance: Secondary | ICD-10-CM | POA: Diagnosis present

## 2012-09-06 DIAGNOSIS — O210 Mild hyperemesis gravidarum: Principal | ICD-10-CM

## 2012-09-06 LAB — BASIC METABOLIC PANEL
BUN: 5 mg/dL — ABNORMAL LOW (ref 6–23)
Chloride: 101 mEq/L (ref 96–112)
Creatinine, Ser: 0.67 mg/dL (ref 0.50–1.10)
GFR calc Af Amer: >90 mL/min (ref >90)
Glucose, Bld: 74 mg/dL (ref 70–99)
Potassium: 3.2 mEq/L — ABNORMAL LOW (ref 3.5–5.1)

## 2012-09-06 MED ORDER — GI COCKTAIL ~~LOC~~
30.0000 mL | Freq: Once | ORAL | Status: AC
  Administered 2012-09-06: 30 mL via ORAL
  Filled 2012-09-06: qty 30

## 2012-09-06 MED ORDER — DOCUSATE SODIUM 100 MG PO CAPS
100.0000 mg | ORAL_CAPSULE | Freq: Two times a day (BID) | ORAL | Status: DC
  Administered 2012-09-06 – 2012-09-07 (×3): 100 mg via ORAL
  Filled 2012-09-06 (×3): qty 1

## 2012-09-06 MED ORDER — POTASSIUM CHLORIDE 10 MEQ/100ML IV SOLN
10.0000 meq | INTRAVENOUS | Status: AC
  Administered 2012-09-06 (×4): 10 meq via INTRAVENOUS
  Filled 2012-09-06 (×4): qty 100

## 2012-09-06 NOTE — Progress Notes (Signed)
Patient ID: Misty Walsh, female   DOB: 1990/07/16, 22 y.o.   MRN: [id number] Patient reports feeling nauseated this morning after eating a few crackers and drinking a little bit of sprite. No emesis since yesterday early evening. Patient overall feels better and is hoping that this is the beginning of her recovery  Filed Vitals:   09/06/12 0616  BP: 101/68  Pulse: 80  Temp: 98.1 F (36.7 C)  Resp: 16   GENERAL: Well-developed, well-nourished female in no acute distress.  ABDOMEN: Soft, nontender, nondistended. Obese. EXTREMITIES: No cyanosis, clubbing, or edema, 2+ distal pulses.  Meds ordered this encounter  Medications  . DISCONTD: prenatal multivitamin tablet 1 tablet    Sig:   . ondansetron (ZOFRAN) tablet 4 mg    Sig:    Or  . ondansetron (ZOFRAN) injection 4 mg    Sig:   . DISCONTD: promethazine (PHENERGAN) 25 mg in sodium chloride 0.9 % 1,000 mL infusion    Sig:   . pyridOXINE (B-6) injection 100 mg    Sig:   . antiseptic oral rinse (BIOTENE) solution 15 mL    Sig:   . sodium chloride 0.9 % 1,000 mL with multivitamins adult (MVI -12) 10 mL, promethazine (PHENERGAN) 25 mg infusion    Sig:   . promethazine (PHENERGAN) 25 mg in sodium chloride 0.9 % 1,000 mL infusion    Sig:    TSH 1.807  A/P 22 yo G1P0 admitted with hyperemesis - Continue IV hydration with phenergan - Continue zofran prn - Continue advancing diet as tolerated - Continue current care

## 2012-09-07 DIAGNOSIS — O210 Mild hyperemesis gravidarum: Secondary | ICD-10-CM

## 2012-09-07 LAB — BASIC METABOLIC PANEL
BUN: 4 mg/dL — ABNORMAL LOW (ref 6–23)
CO2: 22 mEq/L (ref 19–32)
Calcium: 9 mg/dL (ref 8.4–10.5)
Chloride: 105 mEq/L (ref 96–112)
Creatinine, Ser: 0.6 mg/dL (ref 0.50–1.10)
Glucose, Bld: 82 mg/dL (ref 70–99)

## 2012-09-07 MED ORDER — GLYCOPYRROLATE 1 MG PO TABS: 2.0000 mg | ORAL_TABLET | Freq: Three times a day (TID) | ORAL | Status: DC

## 2012-09-07 MED ORDER — ONDANSETRON 8 MG PO TBDP: 8.0000 mg | ORAL_TABLET | Freq: Three times a day (TID) | ORAL | Status: DC | PRN

## 2012-09-07 MED ORDER — POTASSIUM CHLORIDE CRYS ER 20 MEQ PO TBCR: 20.0000 meq | EXTENDED_RELEASE_TABLET | Freq: Two times a day (BID) | ORAL | Status: DC

## 2012-09-07 MED ORDER — PROMETHAZINE HCL 12.5 MG PO TABS: 25.0000 mg | ORAL_TABLET | Freq: Four times a day (QID) | ORAL | Status: DC | PRN

## 2012-09-07 MED ORDER — POTASSIUM CHLORIDE CRYS ER 20 MEQ PO TBCR
20.0000 meq | EXTENDED_RELEASE_TABLET | Freq: Two times a day (BID) | ORAL | Status: DC
  Administered 2012-09-07: 20 meq via ORAL
  Filled 2012-09-07: qty 1

## 2012-09-07 MED ORDER — ACETAMINOPHEN 325 MG PO TABS
650.0000 mg | ORAL_TABLET | Freq: Four times a day (QID) | ORAL | Status: DC | PRN
  Administered 2012-09-07: 650 mg via ORAL
  Filled 2012-09-07: qty 2

## 2012-09-07 NOTE — Discharge Summary (Signed)
Antenatal Physician Discharge Summary  Patient ID: [name] MRN: [id number] DOB/AGE: September 23, 1990 23 y.o.  Admit date: 09/05/2012 Discharge date: 09/07/2012  Admission Diagnoses: IUP at [redacted]w[redacted]d, Hyperemesis gravidarum  Discharge Diagnoses: IUP at 10w5, Hyperemesis gravidarum with hypokalemia  Significant Diagnostic Studies:  Results for orders placed during the hospital encounter of 09/05/12 (from the past 168 hour(s))  TSH   Collection Time    09/05/12 12:41 PM      Result Value Range   TSH 1.807  0.350 - 4.500 uIU/mL  URINALYSIS, ROUTINE W REFLEX MICROSCOPIC   Collection Time    09/05/12  7:10 PM      Result Value Range   Color, Urine YELLOW  YELLOW   APPearance CLOUDY (*) CLEAR   Specific Gravity, Urine >1.030 (*) 1.005 - 1.030   pH 6.0  5.0 - 8.0   Glucose, UA NEGATIVE  NEGATIVE mg/dL   Hgb urine dipstick MODERATE (*) NEGATIVE   Bilirubin Urine SMALL (*) NEGATIVE   Ketones, ur >80 (*) NEGATIVE mg/dL   Protein, ur 30 (*) NEGATIVE mg/dL   Urobilinogen, UA 1.0  0.0 - 1.0 mg/dL   Nitrite NEGATIVE  NEGATIVE   Leukocytes, UA NEGATIVE  NEGATIVE  URINE MICROSCOPIC-ADD ON   Collection Time    09/05/12  7:10 PM      Result Value Range   Squamous Epithelial / LPF MANY (*) RARE   WBC, UA 0-2  <3 WBC/hpf   RBC / HPF 7-10  <3 RBC/hpf   Bacteria, UA FEW (*) RARE   Urine-Other MUCOUS PRESENT    BASIC METABOLIC PANEL   Collection Time    09/06/12  7:56 AM      Result Value Range   Sodium 134 (*) 135 - 145 mEq/L   Potassium 3.2 (*) 3.5 - 5.1 mEq/L   Chloride 101  96 - 112 mEq/L   CO2 22  19 - 32 mEq/L   Glucose, Bld 74  70 - 99 mg/dL   BUN 5 (*) 6 - 23 mg/dL   Creatinine, Ser 4.09  0.50 - 1.10 mg/dL   Calcium 9.4  8.4 - 81.1 mg/dL   GFR calc non Af Amer >90  >90 mL/min   GFR calc Af Amer >90  >90 mL/min  BASIC METABOLIC PANEL   Collection Time    09/07/12  5:06 AM      Result Value Range   Sodium 136  135 - 145 mEq/L   Potassium 3.1 (*) 3.5 - 5.1 mEq/L   Chloride  105  96 - 112 mEq/L   CO2 22  19 - 32 mEq/L   Glucose, Bld 82  70 - 99 mg/dL   BUN 4 (*) 6 - 23 mg/dL   Creatinine, Ser 9.14  0.50 - 1.10 mg/dL   Calcium 9.0  8.4 - 78.2 mg/dL   GFR calc non Af Amer >90  >90 mL/min   GFR calc Af Amer >90  >90 mL/min    Treatments: IV hydration, Zofran, Phenergan  Hospital Course:  This is a 22 y.o. G1P0 with verified IUP at [redacted]w[redacted]d admitted for hyperemesis gravidarum. She had 8 lbs weight loss in one week and was unable to keep anything down.  She was admitted, given IV fluids and antiemetics as mentioned above.  Labs were remarkable for hypokalemia, she received 40 MEQ of KCL IV but her potassium level was still low on subsequent BMET.  She was then given oral repletion.  She did not have any significant  emesis since the evening of the day of admission on 09/05/12 and has tolerated a regular diet.  She was deemed stable for discharge to home with outpatient follow up.  Discharge Exam: BP 110/69  Pulse 79  Temp(Src) 98.3 F (36.8 C) (Oral)  Resp 16  Ht 5\' 4"  (1.626 m)  Wt 243 lb 6 oz (110.394 kg)  BMI 41.75 kg/m2  SpO2 100%  LMP 06/24/2012 General appearance: alert and no distress GI: soft, non-tender; bowel sounds normal; no masses,  no organomegaly Pelvic: deferred Extremities: extremities normal, atraumatic, no cyanosis or edema and Homans sign is negative, no sign of DVT  Discharge Condition: stable  Disposition: Home       Future Appointments Provider Department Dept Phone   09/29/2012 1:00 PM Brock Bad, MD [facility] [phone]       Medication List         Doxylamine-Pyridoxine 10-10 MG Tbec  Commonly known as:  DICLEGIS  Take 2 tablets by mouth at bedtime. If sx persist add 1 tablet every morning starting on day 3. If symptoms persist add 1 tablet every afternoon on day 4.     glycopyrrolate 1 MG tablet  Commonly known as:  ROBINUL  Take 2 tablets (2 mg total) by mouth 3 (three) times daily.     ondansetron  8 MG disintegrating tablet  Commonly known as:  ZOFRAN ODT  Take 1 tablet (8 mg total) by mouth every 8 (eight) hours as needed for nausea.     potassium chloride SA 20 MEQ tablet  Commonly known as:  K-DUR,KLOR-CON  Take 1 tablet (20 mEq total) by mouth 2 (two) times daily.     promethazine 25 MG suppository  Commonly known as:  PHENERGAN  Place 1 suppository (25 mg total) rectally every 6 (six) hours as needed for nausea.     promethazine 12.5 MG tablet  Commonly known as:  PHENERGAN  Take 2 tablets (25 mg total) by mouth every 6 (six) hours as needed for nausea.       Follow-up Information   Schedule an appointment as soon as possible for a visit with [facility]. (Come to MAU if symptoms worsen or as needed)    Contact information:   [address] [facility] [address] [phone]      Signed: Jaynie Collins A M.D. 09/07/2012, 7:39 AM

## 2012-09-07 NOTE — Progress Notes (Signed)
Pt  Teaching  Complete  Prescriptions   Sent to pharmacy   Pt ambulated out without  Staff   Called and left  Message  To call to make sure no problems  Or questions  No emesis  Noted  During am

## 2012-09-16 ENCOUNTER — Encounter: Payer: Self-pay | Admitting: Obstetrics

## 2012-09-16 ENCOUNTER — Ambulatory Visit (INDEPENDENT_AMBULATORY_CARE_PROVIDER_SITE_OTHER): Payer: Medicaid Other | Admitting: Obstetrics

## 2012-09-16 VITALS — BP 128/85 | Temp 98.3°F | Wt 241.0 lb

## 2012-09-16 DIAGNOSIS — Z113 Encounter for screening for infections with a predominantly sexual mode of transmission: Secondary | ICD-10-CM

## 2012-09-16 DIAGNOSIS — Z3201 Encounter for pregnancy test, result positive: Secondary | ICD-10-CM

## 2012-09-16 DIAGNOSIS — Z3401 Encounter for supervision of normal first pregnancy, first trimester: Secondary | ICD-10-CM

## 2012-09-16 LAB — POCT URINALYSIS DIPSTICK
Ketones, UA: NEGATIVE
Protein, UA: 1
Spec Grav, UA: 1.025
pH, UA: 5

## 2012-09-16 NOTE — Progress Notes (Signed)
P 99   Patient c/o pain in lower back, pelvis and hips. Patient is also concerned about a skin tag on her R breast that is getting larger and more painful. Subjective:    Misty Walsh is being seen today for her first obstetrical visit.  This is not a planned pregnancy. She is at [redacted]w[redacted]d gestation. Her obstetrical history is significant for obesity. Relationship with FOB: significant other, living together. Patient does intend to breast feed. Pregnancy history fully reviewed.  Menstrual History: OB History   Grav Para Term Preterm Abortions TAB SAB Ect Mult Living   1               Menarche age: 21 Patient's last menstrual period was 06/24/2012.    The following portions of the patient's history were reviewed and updated as appropriate: allergies, current medications, past family history, past medical history, past social history, past surgical history and problem list.  Review of Systems Pertinent items are noted in HPI.    Objective:    General appearance: alert and no distress Abdomen: normal findings: soft, non-tender Pelvic: cervix normal in appearance, external genitalia normal, no adnexal masses or tenderness, no cervical motion tenderness, vagina normal without discharge and uterus enlarged, soft, NT. Extremities: extremities normal, atraumatic, no cyanosis or edema    Assessment:    Pregnancy at [redacted]w[redacted]d weeks    Plan:    Initial labs drawn. Prenatal vitamins.  Counseling provided regarding continued use of seat belts, cessation of alcohol consumption, smoking or use of illicit drugs; infection precautions i.e., influenza/TDAP immunizations, toxoplasmosis,CMV, parvovirus, listeria and varicella; workplace safety, exercise during pregnancy; routine dental care, safe medications, sexual activity, hot tubs, saunas, pools, travel, caffeine use, fish and methlymercury, potential toxins, hair treatments, varicose veins Weight gain recommendations reviewed: underweight/BMI<  18.5--> gain 28 - 40 lbs; normal weight/BMI 18.5 - 24.9--> gain 25 - 35 lbs; overweight/BMI 25 - 29.9--> gain 15 - 25 lbs; obese/BMI >30->gain  11 - 20 lbs Problem list reviewed and updated. AFP3 discussed: requested. Role of ultrasound in pregnancy discussed; fetal survey: requested. Amniocentesis discussed: not indicated. Follow up in 4 weeks. 50% of 20 min visit spent on counseling and coordination of care.

## 2012-09-17 LAB — OBSTETRIC PANEL
Eosinophils Absolute: 0.1 10*3/uL (ref 0.0–0.7)
Hemoglobin: 12.7 g/dL (ref 12.0–15.0)
Hepatitis B Surface Ag: NEGATIVE
Lymphs Abs: 2 10*3/uL (ref 0.7–4.0)
MCH: 30.5 pg (ref 26.0–34.0)
Monocytes Relative: 7 % (ref 3–12)
Neutro Abs: 5.2 10*3/uL (ref 1.7–7.7)
Neutrophils Relative %: 67 % (ref 43–77)
RBC: 4.17 MIL/uL (ref 3.87–5.11)
Rh Type: POSITIVE
Rubella: 5.51 Index — ABNORMAL HIGH (ref <0.90)

## 2012-09-17 LAB — PAP IG W/ RFLX HPV ASCU

## 2012-09-17 LAB — WET PREP BY MOLECULAR PROBE: Candida species: NEGATIVE

## 2012-09-17 LAB — VITAMIN D 25 HYDROXY (VIT D DEFICIENCY, FRACTURES): Vit D, 25-Hydroxy: 12 ng/mL — ABNORMAL LOW (ref 30–89)

## 2012-09-17 LAB — GC/CHLAMYDIA PROBE AMP: CT Probe RNA: NEGATIVE

## 2012-09-18 ENCOUNTER — Encounter (HOSPITAL_COMMUNITY): Payer: Self-pay | Admitting: Adult Health

## 2012-09-18 ENCOUNTER — Emergency Department (HOSPITAL_COMMUNITY)
Admission: EM | Admit: 2012-09-18 | Discharge: 2012-09-19 | Disposition: A | Discharge status: Home / Self Care | Payer: BC Managed Care – PPO | Attending: Emergency Medicine | Admitting: Emergency Medicine

## 2012-09-18 ENCOUNTER — Encounter (HOSPITAL_COMMUNITY): Payer: Self-pay | Admitting: *Deleted

## 2012-09-18 ENCOUNTER — Inpatient Hospital Stay (EMERGENCY_DEPARTMENT_HOSPITAL)
Admission: AD | Admit: 2012-09-18 | Discharge: 2012-09-18 | Disposition: A | Discharge status: Home / Self Care | Payer: BC Managed Care – PPO | Source: Ambulatory Visit | Attending: Obstetrics | Admitting: Obstetrics

## 2012-09-18 DIAGNOSIS — Z79899 Other long term (current) drug therapy: Secondary | ICD-10-CM | POA: Insufficient documentation

## 2012-09-18 DIAGNOSIS — O210 Mild hyperemesis gravidarum: Secondary | ICD-10-CM | POA: Insufficient documentation

## 2012-09-18 DIAGNOSIS — F329 Major depressive disorder, single episode, unspecified: Secondary | ICD-10-CM | POA: Insufficient documentation

## 2012-09-18 DIAGNOSIS — Z8719 Personal history of other diseases of the digestive system: Secondary | ICD-10-CM | POA: Insufficient documentation

## 2012-09-18 DIAGNOSIS — F17200 Nicotine dependence, unspecified, uncomplicated: Secondary | ICD-10-CM | POA: Insufficient documentation

## 2012-09-18 DIAGNOSIS — F3289 Other specified depressive episodes: Secondary | ICD-10-CM | POA: Insufficient documentation

## 2012-09-18 DIAGNOSIS — I10 Essential (primary) hypertension: Secondary | ICD-10-CM | POA: Insufficient documentation

## 2012-09-18 DIAGNOSIS — O211 Hyperemesis gravidarum with metabolic disturbance: Secondary | ICD-10-CM

## 2012-09-18 DIAGNOSIS — F411 Generalized anxiety disorder: Secondary | ICD-10-CM | POA: Insufficient documentation

## 2012-09-18 DIAGNOSIS — Z8742 Personal history of other diseases of the female genital tract: Secondary | ICD-10-CM | POA: Insufficient documentation

## 2012-09-18 DIAGNOSIS — Z8619 Personal history of other infectious and parasitic diseases: Secondary | ICD-10-CM | POA: Insufficient documentation

## 2012-09-18 DIAGNOSIS — Z22330 Carrier of Group B streptococcus: Secondary | ICD-10-CM

## 2012-09-18 LAB — URINE MICROSCOPIC-ADD ON

## 2012-09-18 LAB — URINALYSIS, ROUTINE W REFLEX MICROSCOPIC
Glucose, UA: NEGATIVE mg/dL
Leukocytes, UA: NEGATIVE
Specific Gravity, Urine: >1.03 — ABNORMAL HIGH (ref 1.005–1.030)
Urobilinogen, UA: 0.2 mg/dL (ref 0.0–1.0)

## 2012-09-18 LAB — CBC
HCT: 36.2 % (ref 36.0–46.0)
MCHC: 34.5 g/dL (ref 30.0–36.0)
Platelets: 235 10*3/uL (ref 150–400)
RDW: 12.8 % (ref 11.5–15.5)
WBC: 6.6 10*3/uL (ref 4.0–10.5)

## 2012-09-18 LAB — COMPREHENSIVE METABOLIC PANEL
ALT: 29 U/L (ref 0–35)
AST: 22 U/L (ref 0–37)
Albumin: 3.4 g/dL — ABNORMAL LOW (ref 3.5–5.2)
Alkaline Phosphatase: 59 U/L (ref 39–117)
BUN: 5 mg/dL — ABNORMAL LOW (ref 6–23)
Chloride: 103 mEq/L (ref 96–112)
Potassium: 3.6 mEq/L (ref 3.5–5.1)
Sodium: 135 mEq/L (ref 135–145)
Total Bilirubin: 0.5 mg/dL (ref 0.3–1.2)
Total Protein: 6.6 g/dL (ref 6.0–8.3)

## 2012-09-18 LAB — CULTURE, OB URINE: Colony Count: 70000

## 2012-09-18 LAB — HEMOGLOBINOPATHY EVALUATION: Hemoglobin Other: 0 %

## 2012-09-18 MED ORDER — PROCHLORPERAZINE EDISYLATE 5 MG/ML IJ SOLN
10.0000 mg | Freq: Once | INTRAMUSCULAR | Status: AC
  Administered 2012-09-18: 10 mg via INTRAVENOUS
  Filled 2012-09-18: qty 2

## 2012-09-18 MED ORDER — FLEET ENEMA 7-19 GM/118ML RE ENEM
1.0000 | ENEMA | Freq: Once | RECTAL | Status: AC
  Administered 2012-09-18: 1 via RECTAL

## 2012-09-18 MED ORDER — PROMETHAZINE HCL 25 MG/ML IJ SOLN
25.0000 mg | Freq: Once | INTRAMUSCULAR | Status: AC
  Administered 2012-09-18: 25 mg via INTRAVENOUS
  Filled 2012-09-18: qty 1

## 2012-09-18 MED ORDER — KCL-LACTATED RINGERS-D5W 20 MEQ/L IV SOLN
INTRAVENOUS | Status: DC
  Administered 2012-09-18: 12:00:00 via INTRAVENOUS
  Filled 2012-09-18 (×10): qty 1000

## 2012-09-18 MED ORDER — ONDANSETRON HCL 4 MG/2ML IJ SOLN
4.0000 mg | Freq: Once | INTRAMUSCULAR | Status: DC
  Filled 2012-09-18: qty 2

## 2012-09-18 MED ORDER — DEXTROSE-NACL 5-0.9 % IV SOLN
Freq: Once | INTRAVENOUS | Status: AC
  Administered 2012-09-19: via INTRAVENOUS

## 2012-09-18 MED ORDER — SODIUM CHLORIDE 0.9 % IV BOLUS (SEPSIS)
1000.0000 mL | Freq: Once | INTRAVENOUS | Status: AC
  Administered 2012-09-18: 1000 mL via INTRAVENOUS

## 2012-09-18 MED ORDER — SODIUM CHLORIDE 0.9 % IV SOLN
Freq: Once | INTRAVENOUS | Status: AC
  Administered 2012-09-18: 23:00:00 via INTRAVENOUS

## 2012-09-18 MED ORDER — ONDANSETRON HCL 4 MG/2ML IJ SOLN
4.0000 mg | Freq: Once | INTRAMUSCULAR | Status: AC
Start: 2012-09-18 — End: 2012-09-18
  Administered 2012-09-18: 4 mg via INTRAVENOUS
  Filled 2012-09-18: qty 2

## 2012-09-18 MED ORDER — ACETAMINOPHEN 325 MG PO TABS
650.0000 mg | ORAL_TABLET | Freq: Once | ORAL | Status: AC
  Administered 2012-09-18: 650 mg via ORAL
  Filled 2012-09-18: qty 2

## 2012-09-18 NOTE — MAU Note (Signed)
Headache is gone; good results from enema; slight nausea still; spitting but no vomiting;

## 2012-09-18 NOTE — MAU Note (Signed)
C/o cold and cough for 2 days; c/o N&V for past 4-5 days;c/o constipation for past week or 2;

## 2012-09-18 NOTE — MAU Provider Note (Signed)
History     CSN: [id number]  Arrival date and time: 09/18/12 1027   First Provider Initiated Contact with Patient 09/18/12 1148      Chief Complaint  Patient presents with  . Nausea  . Emesis During Pregnancy  . Nasal Congestion  . Cough  . Generalized Body Aches   HPI  Pt is a G1P0 at 12.2 wks IUP here with report of nausea and vomiting that returned 5 days ago.  No report of fever.  +body aches and chills.  No known exposure to ill individuals.  Last bowel movement one week ago.  Reports vomiting 5-6 x today.  Unable to hold down PO pills.      Past Medical History  Diagnosis Date  . Hypertension   . Ovarian cyst   . Anxiety   . Chlamydia   . Depression med made her navel itching and  made her sleepy so she quit taking them  . GERD (gastroesophageal reflux disease)     Past Surgical History  Procedure Laterality Date  . No past surgeries      Family History  Problem Relation Age of Onset  . Diabetes Mother   . Heart disease Mother   . Hypertension Mother   . Depression Mother   . Stroke Mother   . Arthritis Mother   . Stroke Father   . Asthma Sister   . Kidney disease Brother     genetic condition  . Diabetes Maternal Aunt   . Hypertension Maternal Aunt   . Asthma Maternal Aunt   . Epilepsy Maternal Aunt   . Hypertension Maternal Grandmother   . Dementia Maternal Grandmother   . Heart disease Maternal Grandmother   . Diabetes Paternal Grandmother     History  Substance Use Topics  . Smoking status: Current Every Day Smoker -- 0.25 packs/day for 5 years    Types: Cigarettes  . Smokeless tobacco: Not on file  . Alcohol Use: No     Comment: occasionallly    Allergies: No Known Allergies  Prescriptions prior to admission  Medication Sig Dispense Refill  . Doxylamine-Pyridoxine (DICLEGIS) 10-10 MG TBEC Take 2 tablets by mouth at bedtime. If sx persist add 1 tablet every morning starting on day 3. If symptoms persist add 1 tablet every afternoon on  day 4.  90 tablet  0  . glycopyrrolate (ROBINUL) 1 MG tablet Take 2 tablets (2 mg total) by mouth 3 (three) times daily.  90 tablet  3  . ondansetron (ZOFRAN ODT) 8 MG disintegrating tablet Take 1 tablet (8 mg total) by mouth every 8 (eight) hours as needed for nausea.  30 tablet  3  . promethazine (PHENERGAN) 12.5 MG tablet Take 2 tablets (25 mg total) by mouth every 6 (six) hours as needed for nausea.  60 tablet  3  . promethazine (PHENERGAN) 25 MG suppository Place 1 suppository (25 mg total) rectally every 6 (six) hours as needed for nausea.  12 each  0  . Pyridoxine HCl (VITAMIN B-6 PO) Take 1 tablet by mouth daily.      . potassium chloride SA (K-DUR,KLOR-CON) 20 MEQ tablet Take 1 tablet (20 mEq total) by mouth 2 (two) times daily.  10 tablet  1    Review of Systems  Constitutional: Positive for chills. Negative for fever.  HENT: Negative for sore throat.   Respiratory: Negative for sputum production and shortness of breath.   Gastrointestinal: Positive for nausea, vomiting and constipation. Negative for abdominal pain and  diarrhea.  Genitourinary: Positive for dysuria.  Neurological: Positive for headaches.  All other systems reviewed and are negative.   Physical Exam   Blood pressure 122/93, pulse 113, temperature 98.3 F (36.8 C), temperature source Oral, resp. rate 18, last menstrual period 06/24/2012.  Physical Exam  Constitutional: She is oriented to person, place, and time. She appears well-developed and well-nourished.  HENT:  Head: Normocephalic.  Mouth/Throat: Mucous membranes are dry.  Neck: Normal range of motion. Neck supple.  Cardiovascular: Normal rate, regular rhythm and normal heart sounds.   Respiratory: Effort normal and breath sounds normal.  GI: There is no tenderness.  Genitourinary: No bleeding around the vagina. No vaginal discharge found.  Neurological: She is alert and oriented to person, place, and time. She has normal reflexes.  Skin: Skin is warm  and dry. She is not diaphoretic.    MAU Course  Procedures Received 1L of D5LR with 20 MEQ of KCL IV Zofran and Phenergan 1610 Pt reports improvement in nausea; tolerates PO fluids   Results for orders placed during the hospital encounter of 09/18/12 (from the past 24 hour(s))  URINALYSIS, ROUTINE W REFLEX MICROSCOPIC     Status: Abnormal   Collection Time    09/18/12 10:40 AM      Result Value Range   Color, Urine YELLOW  YELLOW   APPearance CLEAR  CLEAR   Specific Gravity, Urine >1.030 (*) 1.005 - 1.030   pH 6.0  5.0 - 8.0   Glucose, UA NEGATIVE  NEGATIVE mg/dL   Hgb urine dipstick TRACE (*) NEGATIVE   Bilirubin Urine NEGATIVE  NEGATIVE   Ketones, ur >80 (*) NEGATIVE mg/dL   Protein, ur 30 (*) NEGATIVE mg/dL   Urobilinogen, UA 0.2  0.0 - 1.0 mg/dL   Nitrite NEGATIVE  NEGATIVE   Leukocytes, UA NEGATIVE  NEGATIVE  URINE MICROSCOPIC-ADD ON     Status: Abnormal   Collection Time    09/18/12 10:40 AM      Result Value Range   Squamous Epithelial / LPF FEW (*) RARE   WBC, UA 3-6  <3 WBC/hpf   RBC / HPF 3-6  <3 RBC/hpf   Bacteria, UA FEW (*) RARE   Casts GRANULAR CAST (*) NEGATIVE   Urine-Other MUCOUS PRESENT    CBC     Status: None   Collection Time    09/18/12  2:23 PM      Result Value Range   WBC 6.6  4.0 - 10.5 K/uL   RBC 4.02  3.87 - 5.11 MIL/uL   Hemoglobin 12.5  12.0 - 15.0 g/dL   HCT 69.6  29.5 - 28.4 %   MCV 90.0  78.0 - 100.0 fL   MCH 31.1  26.0 - 34.0 pg   MCHC 34.5  30.0 - 36.0 g/dL   RDW 13.2  44.0 - 10.2 %   Platelets 235  150 - 400 K/uL  COMPREHENSIVE METABOLIC PANEL     Status: Abnormal   Collection Time    09/18/12  2:23 PM      Result Value Range   Sodium 135  135 - 145 mEq/L   Potassium 3.6  3.5 - 5.1 mEq/L   Chloride 103  96 - 112 mEq/L   CO2 20  19 - 32 mEq/L   Glucose, Bld 131 (*) 70 - 99 mg/dL   BUN 5 (*) 6 - 23 mg/dL   Creatinine, Ser 7.25  0.50 - 1.10 mg/dL   Calcium 9.8  8.4 - 36.6  mg/dL   Total Protein 6.6  6.0 - 8.3 g/dL   Albumin  3.4 (*) 3.5 - 5.2 g/dL   AST 22  0 - 37 U/L   ALT 29  0 - 35 U/L   Alkaline Phosphatase 59  39 - 117 U/L   Total Bilirubin 0.5  0.3 - 1.2 mg/dL   GFR calc non Af Amer >90  >90 mL/min   GFR calc Af Amer >90  >90 mL/min    Assessment and Plan  Hyperemesis in Pregnancy  Plan: DC to home  Continue antiemetic meds Keep scheduled OB appointment.   [facility] 09/18/2012, 11:50 AM

## 2012-09-18 NOTE — ED Notes (Signed)
Presents [redacted] weeks pregnant with hyperemesis gravidarum, discharged from [facility] at 5 pm today still vomiting and has been unable to stop since. [facility]. pt is actively spitting at triage. Reports blood in emesis. Endorses pain all over body and inability to eat or drink since Monday due to nausea and vomiting.

## 2012-09-18 NOTE — ED Provider Notes (Signed)
CSN: [id number]     Arrival date & time 09/18/12  1950 History     First MD Initiated Contact with Patient 09/18/12 2102     Chief Complaint  Patient presents with  . Hyperemesis Gravidarum   (Consider location/radiation/quality/duration/timing/severity/associated sxs/prior Treatment) HPI  22 year old G1 P0 female presents to the emergency department chief complaint of nausea and vomiting.  Patient was seen earlier today at [facility] hospital she was seen for the same complaint, note states that she was treated with Zofran, and promethazine.  She tolerated by mouth fluids and was discharged home.  Patient states that she never felt any better.  That she was vomiting before she and she came here for further treatment.  Patient is being followed  At [facility] care.  She states that she has been unable to hold down foods or fluids. She complains of bodyaches nausea and vomiting.  Patient denies any abdominal pain.  She denies any blood or fluid her vagina.  Past Medical History  Diagnosis Date  . Hypertension   . Ovarian cyst   . Anxiety   . Chlamydia   . Depression med made her navel itching and  made her sleepy so she quit taking them  . GERD (gastroesophageal reflux disease)    Past Surgical History  Procedure Laterality Date  . No past surgeries     Family History  Problem Relation Age of Onset  . Diabetes Mother   . Heart disease Mother   . Hypertension Mother   . Depression Mother   . Stroke Mother   . Arthritis Mother   . Stroke Father   . Asthma Sister   . Kidney disease Brother     genetic condition  . Diabetes Maternal Aunt   . Hypertension Maternal Aunt   . Asthma Maternal Aunt   . Epilepsy Maternal Aunt   . Hypertension Maternal Grandmother   . Dementia Maternal Grandmother   . Heart disease Maternal Grandmother   . Diabetes Paternal Grandmother    History  Substance Use Topics  . Smoking status: Current Every Day Smoker -- 0.25 packs/day for 5 years   Types: Cigarettes  . Smokeless tobacco: Not on file  . Alcohol Use: No     Comment: occasionallly   OB History   Grav Para Term Preterm Abortions TAB SAB Ect Mult Living   1              Review of Systems Ten systems reviewed and are negative for acute change, except as noted in the HPI.   Allergies  Review of patient's allergies indicates no known allergies.  Home Medications   Current Outpatient Rx  Name  Route  Sig  Dispense  Refill  . Doxylamine-Pyridoxine (DICLEGIS) 10-10 MG TBEC   Oral   Take 2 tablets by mouth at bedtime. If sx persist add 1 tablet every morning starting on day 3. If symptoms persist add 1 tablet every afternoon on day 4.   90 tablet   0   . glycopyrrolate (ROBINUL) 1 MG tablet   Oral   Take 2 tablets (2 mg total) by mouth 3 (three) times daily.   90 tablet   3   . ondansetron (ZOFRAN ODT) 8 MG disintegrating tablet   Oral   Take 1 tablet (8 mg total) by mouth every 8 (eight) hours as needed for nausea.   30 tablet   3   . promethazine (PHENERGAN) 12.5 MG tablet   Oral   Take  2 tablets (25 mg total) by mouth every 6 (six) hours as needed for nausea.   60 tablet   3   . promethazine (PHENERGAN) 25 MG suppository   Rectal   Place 1 suppository (25 mg total) rectally every 6 (six) hours as needed for nausea.   12 each   0   . Pyridoxine HCl (VITAMIN B-6 PO)   Oral   Take 1 tablet by mouth daily.          BP 123/81  Pulse 108  Temp(Src) 98.7 F (37.1 C) (Oral)  Resp 18  SpO2 97%  LMP 06/24/2012 Physical Exam Physical Exam  Nursing note and vitals reviewed. Constitutional: She is oriented to person, place, and time. She appears well-developed and well-nourished.Obese. Appears clinically dehydrated. HENT:  Head: Normocephalic and atraumatic.  Eyes: Conjunctivae normal and EOM are normal. Pupils are equal, round, and reactive to light. No scleral icterus.  Neck: Normal range of motion.  Cardiovascular: Normal rate, regular  rhythm and normal heart sounds.  Exam reveals no gallop and no friction rub.   No murmur heard. Pulmonary/Chest: Effort normal and breath sounds normal. No respiratory distress.  Abdominal: Soft. Bowel sounds are normal. She exhibits no distension and no mass. There is no tenderness. There is no guarding.  Neurological: She is alert and oriented to person, place, and time.  Skin: dry mucous membranes  ED Course   Procedures (including critical care time)  Labs Reviewed - No data to display No results found. 1. Hyperemesis gravidarum     MDM  11:28 PM BP 134/76  Pulse 102  Temp(Src) 98.6 F (37 C) (Oral)  Resp 18  SpO2 100%  LMP 06/24/2012 Patient no longer vomiting. She c/o headache and bodyaches. She is afebrile, but tachycardic.  Patient given compazine. 2 L NS and 1L dextrose w/NS Patient was able to drink three cups of apple juice and eat saltine crackers. I will change the patient's rx to rectal compazine with warning of dystonia and extrapyramidal sxs. PO chewable dimenhydrinate. F/u with obgyn. The patient appears reasonably screened and/or stabilized for discharge and I doubt any other medical condition or other [facility] requiring further screening, evaluation, or treatment in the ED at this time prior to discharge.   Arthor Captain, PA-C 09/19/12 1531

## 2012-09-18 NOTE — Discharge Instructions (Signed)
Hyperemesis Gravidarum  Hyperemesis gravidarum is a severe form of nausea and vomiting that happens during pregnancy. Hyperemesis is worse than morning sickness. It may cause a woman to have nausea or vomiting all day for many days. It may keep a woman from eating and drinking enough food and liquids. Hyperemesis usually occurs during the first half (the first 20 weeks) of pregnancy. It often goes away once a woman is in her second half of pregnancy. However, sometimes hyperemesis continues through an entire pregnancy.   CAUSES   The cause of this condition is not completely known but is thought to be due to changes in the body's hormones when pregnant. It could be the high level of the pregnancy hormone or an increase in estrogen in the body.   SYMPTOMS    Severe nausea and vomiting.   Nausea that does not go away.   Vomiting that does not allow you to keep any food down.   Weight loss and body fluid loss (dehydration).   Having no desire to eat or not liking food you have previously enjoyed.  DIAGNOSIS   Your caregiver may ask you about your symptoms. Your caregiver may also order blood tests and urine tests to make sure something else is not causing the problem.   TREATMENT   You may only need medicine to control the problem. If medicines do not control the nausea and vomiting, you will be treated in the hospital to prevent dehydration, acidosis, weight loss, and changes in the electrolytes in your body that may harm the unborn baby (fetus). You may need intravenous (IV) fluids.   HOME CARE INSTRUCTIONS    Take all medicine as directed by your caregiver.   Try eating a couple of dry crackers or toast in the morning before getting out of bed.   Avoid foods and smells that upset your stomach.   Avoid fatty and spicy foods. Eat 5 to 6 small meals a day.   Do not drink when eating meals. Drink between meals.   For snacks, eat high protein foods, such as cheese. Eat or suck on things that have ginger in  them. Ginger helps nausea.   Avoid food preparation. The smell of food can spoil your appetite.   Avoid iron pills and iron in your multivitamins until after 3 to 4 months of being pregnant.  SEEK MEDICAL CARE IF:    Your abdominal pain increases since the last time you saw your caregiver.   You have a severe headache.   You develop vision problems.   You feel you are losing weight.  SEEK IMMEDIATE MEDICAL CARE IF:    You are unable to keep fluids down.   You vomit blood.   You have constant nausea and vomiting.   You have a fever.   You have excessive weakness, dizziness, fainting, or extreme thirst.  MAKE SURE YOU:    Understand these instructions.   Will watch your condition.   Will get help right away if you are not doing well or get worse.  Document Released: 02/04/2005 Document Revised: 04/29/2011 Document Reviewed: 05/07/2010  [employer] Patient Information 2014 [employer].

## 2012-09-19 LAB — URINE CULTURE

## 2012-09-19 MED ORDER — PROCHLORPERAZINE 25 MG RE SUPP: 25.0000 mg | Freq: Two times a day (BID) | RECTAL | Status: DC | PRN

## 2012-09-19 MED ORDER — DIMENHYDRINATE 50 MG PO CHEW: 1.0000 | CHEWABLE_TABLET | Freq: Four times a day (QID) | ORAL | Status: DC

## 2012-09-19 NOTE — ED Provider Notes (Signed)
Medical screening examination/treatment/procedure(s) were performed by non-physician practitioner and as supervising physician I was immediately available for consultation/collaboration.   Christopher J. Pollina, MD 09/19/12 1754 

## 2012-09-19 NOTE — ED Notes (Signed)
Pt drank 2 apple juices without problems.

## 2012-09-23 ENCOUNTER — Encounter: Payer: Self-pay | Admitting: Obstetrics

## 2012-09-29 ENCOUNTER — Encounter: Payer: Self-pay | Admitting: Obstetrics

## 2012-09-30 ENCOUNTER — Other Ambulatory Visit: Payer: Self-pay | Admitting: Obstetrics

## 2012-09-30 DIAGNOSIS — N390 Urinary tract infection, site not specified: Secondary | ICD-10-CM

## 2012-09-30 MED ORDER — AMOXICILLIN-POT CLAVULANATE 400-57 MG PO CHEW: 1.0000 | CHEWABLE_TABLET | Freq: Three times a day (TID) | ORAL | Status: DC

## 2012-10-02 ENCOUNTER — Encounter (HOSPITAL_COMMUNITY): Payer: Self-pay | Admitting: *Deleted

## 2012-10-02 ENCOUNTER — Inpatient Hospital Stay (HOSPITAL_COMMUNITY)
Admission: AD | Admit: 2012-10-02 | Discharge: 2012-10-02 | Disposition: A | Discharge status: Home / Self Care | Payer: BC Managed Care – PPO | Source: Ambulatory Visit | Attending: Obstetrics | Admitting: Obstetrics

## 2012-10-02 DIAGNOSIS — R109 Unspecified abdominal pain: Secondary | ICD-10-CM | POA: Insufficient documentation

## 2012-10-02 DIAGNOSIS — O219 Vomiting of pregnancy, unspecified: Secondary | ICD-10-CM

## 2012-10-02 DIAGNOSIS — O210 Mild hyperemesis gravidarum: Secondary | ICD-10-CM

## 2012-10-02 LAB — URINALYSIS, ROUTINE W REFLEX MICROSCOPIC
Glucose, UA: 250 mg/dL — AB
Leukocytes, UA: NEGATIVE
Specific Gravity, Urine: >1.03 — ABNORMAL HIGH (ref 1.005–1.030)
pH: 6 (ref 5.0–8.0)

## 2012-10-02 LAB — URINE MICROSCOPIC-ADD ON

## 2012-10-02 MED ORDER — PROMETHAZINE HCL 25 MG/ML IJ SOLN
25.0000 mg | INTRAMUSCULAR | Status: AC
  Administered 2012-10-02: 25 mg via INTRAMUSCULAR
  Filled 2012-10-02: qty 1

## 2012-10-02 MED ORDER — LACTATED RINGERS IV SOLN
INTRAVENOUS | Status: DC
  Administered 2012-10-02: 19:00:00 via INTRAVENOUS

## 2012-10-02 MED ORDER — ONDANSETRON 8 MG PO TBDP: 8.0000 mg | ORAL_TABLET | Freq: Three times a day (TID) | ORAL | Status: DC | PRN

## 2012-10-02 MED ORDER — FAMOTIDINE IN NACL 20-0.9 MG/50ML-% IV SOLN
20.0000 mg | INTRAVENOUS | Status: AC
  Administered 2012-10-02: 20 mg via INTRAVENOUS
  Filled 2012-10-02: qty 50

## 2012-10-02 MED ORDER — HYDROMORPHONE HCL PF 1 MG/ML IJ SOLN
1.0000 mg | INTRAMUSCULAR | Status: AC
  Administered 2012-10-02: 1 mg via INTRAVENOUS
  Filled 2012-10-02: qty 1

## 2012-10-02 MED ORDER — PROMETHAZINE HCL 12.5 MG RE SUPP: 12.5000 mg | Freq: Four times a day (QID) | RECTAL | Status: DC | PRN

## 2012-10-02 MED ORDER — LACTATED RINGERS IV SOLN
INTRAVENOUS | Status: DC
  Administered 2012-10-02: 18:00:00 via INTRAVENOUS

## 2012-10-02 MED ORDER — M.V.I. ADULT IV INJ
Freq: Once | INTRAVENOUS | Status: AC
  Administered 2012-10-02: 17:00:00 via INTRAVENOUS
  Filled 2012-10-02: qty 1000

## 2012-10-02 MED ORDER — GI COCKTAIL ~~LOC~~
30.0000 mL | Freq: Once | ORAL | Status: DC
  Filled 2012-10-02: qty 30

## 2012-10-02 NOTE — MAU Provider Note (Signed)
Attestation of Attending Supervision of Advanced Practitioner (CNM/NP): Evaluation and management procedures were performed by the Advanced Practitioner under my supervision and collaboration.  I have reviewed the Advanced Practitioner's note and chart, and I agree with the management and plan.  HARRAWAY-SMITH, Dakwan Pridgen 8:17 PM

## 2012-10-02 NOTE — MAU Provider Note (Addendum)
History     CSN: [id number]  Arrival date and time: 10/02/12 1348   None     Chief Complaint  Patient presents with  . Morning Sickness  . Emesis During Pregnancy  . Abdominal Pain  . Hyperemesis Gravidarum   HPI Comments: Pt is in MAU today for persistent nausea and vomiting in pregnancy. She is a patient of Dr Clearance Coots. Her last Compazine suppository was Aug 12th. States she ran out and it is to difficult to get through to Dr Thomes Lolling office to have it refilled. She has total weight loss of 32 lbs. She is unable to give urine at this time.       Past Medical History  Diagnosis Date  . Hypertension   . Ovarian cyst   . Anxiety   . Chlamydia   . Depression med made her navel itching and  made her sleepy so she quit taking them  . GERD (gastroesophageal reflux disease)     Past Surgical History  Procedure Laterality Date  . No past surgeries      Family History  Problem Relation Age of Onset  . Diabetes Mother   . Heart disease Mother   . Hypertension Mother   . Depression Mother   . Stroke Mother   . Arthritis Mother   . Stroke Father   . Asthma Sister   . Kidney disease Brother     genetic condition  . Diabetes Maternal Aunt   . Hypertension Maternal Aunt   . Asthma Maternal Aunt   . Epilepsy Maternal Aunt   . Hypertension Maternal Grandmother   . Dementia Maternal Grandmother   . Heart disease Maternal Grandmother   . Diabetes Paternal Grandmother     History  Substance Use Topics  . Smoking status: Former Smoker -- 0.25 packs/day for 5 years    Types: Cigarettes  . Smokeless tobacco: Not on file  . Alcohol Use: No     Comment: occasionallly    Allergies: No Known Allergies  Prescriptions prior to admission  Medication Sig Dispense Refill  . glycopyrrolate (ROBINUL) 1 MG tablet Take 2 tablets (2 mg total) by mouth 3 (three) times daily.  90 tablet  3  . prochlorperazine (COMPAZINE) 25 MG suppository Place 1 suppository (25 mg total) rectally  every 12 (twelve) hours as needed for nausea.  12 suppository  0  . Pyridoxine HCl (VITAMIN B-6 PO) Take 1 tablet by mouth daily.        ROS Physical Exam   Blood pressure 117/76, pulse 100, temperature 99.3 F (37.4 C), temperature source Oral, resp. rate 19, height 5\' 4"  (1.626 m), weight 103.874 kg (229 lb), last menstrual period 06/24/2012.  Physical Exam  Constitutional: She is oriented to person, place, and time. She appears well-developed and well-nourished. She appears distressed.  HENT:  Head: Normocephalic and atraumatic.  Eyes: Pupils are equal, round, and reactive to light.  Cardiovascular: Normal heart sounds and intact distal pulses.  Exam reveals no gallop and no friction rub.   No murmur heard. Tachycardia that became NSR after 2 liters   Respiratory: Effort normal and breath sounds normal.  GI: Soft. She exhibits no distension. There is tenderness.  Bowel sounds decreased, tenderness in epigastric region  Musculoskeletal: Normal range of motion.  Neurological: She is alert and oriented to person, place, and time.    Results for orders placed during the hospital encounter of 10/02/12 (from the past 24 hour(s))  URINALYSIS, ROUTINE W REFLEX MICROSCOPIC  Status: Abnormal   Collection Time    10/02/12  6:30 PM      Result Value Range   Color, Urine ORANGE (*) YELLOW   APPearance CLEAR  CLEAR   Specific Gravity, Urine >1.030 (*) 1.005 - 1.030   pH 6.0  5.0 - 8.0   Glucose, UA 250 (*) NEGATIVE mg/dL   Hgb urine dipstick NEGATIVE  NEGATIVE   Bilirubin Urine SMALL (*) NEGATIVE   Ketones, ur >80 (*) NEGATIVE mg/dL   Protein, ur 30 (*) NEGATIVE mg/dL   Urobilinogen, UA 0.2  0.0 - 1.0 mg/dL   Nitrite NEGATIVE  NEGATIVE   Leukocytes, UA NEGATIVE  NEGATIVE  URINE MICROSCOPIC-ADD ON     Status: Abnormal   Collection Time    10/02/12  6:30 PM      Result Value Range   Squamous Epithelial / LPF FEW (*) RARE   WBC, UA 3-6  <3 WBC/hpf   Urine-Other AMORPHOUS  URATES/PHOSPHATES      MAU Course  Procedures  MDM Pt is given 3 liters of IVF. 50mg  phenergan, 20mg  pepcid IV, and 1 mg dilaudid Dr Clearance Coots advised of patients condition and is in agreement with plan Patient is feeling much better after IV fluids. WIll DC home with home meds.   Assessment and Plan  Report to Thressa Sheller CMN 1. Nausea and vomiting in pregnancy prior to [redacted] weeks gestation    RX: phenergan suppository and ODT Zofran FU with the office Return to MAU as needed   Carolynn Serve 10/02/2012, 7:22 PM

## 2012-10-02 NOTE — MAU Note (Signed)
Pt presents with complaints of pain in her right side that radiates down into her leg. States she has had nausea and vomiting with this entire pregnancy. Pt is taking zofran and phenergan for the nausea and vomiting

## 2012-10-09 ENCOUNTER — Encounter (HOSPITAL_COMMUNITY): Payer: Self-pay | Admitting: *Deleted

## 2012-10-09 ENCOUNTER — Inpatient Hospital Stay (HOSPITAL_COMMUNITY)
Admission: AD | Admit: 2012-10-09 | Discharge: 2012-10-09 | Disposition: A | Discharge status: Home / Self Care | Payer: BC Managed Care – PPO | Source: Ambulatory Visit | Attending: Obstetrics | Admitting: Obstetrics

## 2012-10-09 ENCOUNTER — Telehealth: Payer: Self-pay | Admitting: *Deleted

## 2012-10-09 DIAGNOSIS — R0789 Other chest pain: Secondary | ICD-10-CM

## 2012-10-09 DIAGNOSIS — R079 Chest pain, unspecified: Secondary | ICD-10-CM | POA: Insufficient documentation

## 2012-10-09 DIAGNOSIS — O99891 Other specified diseases and conditions complicating pregnancy: Secondary | ICD-10-CM | POA: Insufficient documentation

## 2012-10-09 DIAGNOSIS — K219 Gastro-esophageal reflux disease without esophagitis: Secondary | ICD-10-CM | POA: Insufficient documentation

## 2012-10-09 LAB — URINALYSIS, ROUTINE W REFLEX MICROSCOPIC
Ketones, ur: NEGATIVE mg/dL
Leukocytes, UA: NEGATIVE
Nitrite: NEGATIVE
Protein, ur: NEGATIVE mg/dL

## 2012-10-09 MED ORDER — OMEPRAZOLE 20 MG PO CPDR: 20.0000 mg | DELAYED_RELEASE_CAPSULE | Freq: Two times a day (BID) | ORAL | Status: DC

## 2012-10-09 MED ORDER — GI COCKTAIL ~~LOC~~
30.0000 mL | Freq: Once | ORAL | Status: AC
  Administered 2012-10-09: 30 mL via ORAL
  Filled 2012-10-09: qty 30

## 2012-10-09 NOTE — MAU Note (Signed)
Pt states has been throwing up stomach acid for past 4 days, notes blood in sputum, pain in chest worse with coughing, laughing, inspiration. Denies abnormal vaginal discharge or bleeding. Was told by MD office for EKG and eval. Takes medication for acid reflux. Hx anxiety attacks, states sometimes heart feels heavy and tight.

## 2012-10-09 NOTE — Telephone Encounter (Signed)
Patient called c/o chest pain and tightness- for 2 days. Advised patient she need to go to MAU to get checked- heart issue vs reflux. Patient voiced understanding. Dr Clearance Coots notified.

## 2012-10-09 NOTE — MAU Provider Note (Signed)
History     CSN: [id number]  Arrival date and time: 10/09/12 1512   First Provider Initiated Contact with Patient 10/09/12 1611      Chief Complaint  Patient presents with  . Gastrophageal Reflux   HPI Misty Walsh is 22 y.o. G1P0 [redacted]w[redacted]d weeks presenting with chest pain and heartburn.  She has had these same sxs for several week but more intense this last week.  She tried Rolaids that helped the burning but pressure is still there.  Thinks her heart skips beats and going too fast.  Occurs when she laughs--becomes short of breath.  Denies abdominal pain, vaginal bleeding/discharge.  Fetal heart rate is 156.  She is a patient of Dr. Verdell Carmine.  She called office, told to come here for EKG.    Past Medical History  Diagnosis Date  . Hypertension   . Ovarian cyst   . Anxiety   . Chlamydia   . Depression med made her navel itching and  made her sleepy so she quit taking them  . GERD (gastroesophageal reflux disease)     Past Surgical History  Procedure Laterality Date  . Breast biopsy      benign    Family History  Problem Relation Age of Onset  . Diabetes Mother   . Heart disease Mother   . Hypertension Mother   . Depression Mother   . Stroke Mother   . Arthritis Mother   . Stroke Father   . Asthma Sister   . Kidney disease Brother     genetic condition  . Diabetes Maternal Aunt   . Hypertension Maternal Aunt   . Asthma Maternal Aunt   . Epilepsy Maternal Aunt   . Hypertension Maternal Grandmother   . Dementia Maternal Grandmother   . Heart disease Maternal Grandmother   . Diabetes Paternal Grandmother     History  Substance Use Topics  . Smoking status: Former Smoker -- 0.25 packs/day for 5 years    Types: Cigarettes  . Smokeless tobacco: Not on file  . Alcohol Use: No     Comment: occasionallly    Allergies: No Known Allergies  Prescriptions prior to admission  Medication Sig Dispense Refill  . glycopyrrolate (ROBINUL) 1 MG tablet Take 2 mg by  mouth 3 (three) times daily as needed (for spitting).      . ondansetron (ZOFRAN ODT) 8 MG disintegrating tablet Take 1 tablet (8 mg total) by mouth every 8 (eight) hours as needed for nausea.  30 tablet  3  . Pyridoxine HCl (VITAMIN B-6 PO) Take 1 tablet by mouth daily.      . [DISCONTINUED] glycopyrrolate (ROBINUL) 1 MG tablet Take 2 tablets (2 mg total) by mouth 3 (three) times daily.  90 tablet  3  . prochlorperazine (COMPAZINE) 25 MG suppository Place 1 suppository (25 mg total) rectally every 12 (twelve) hours as needed for nausea.  12 suppository  0  . promethazine (PHENERGAN) 12.5 MG suppository Place 1 suppository (12.5 mg total) rectally every 6 (six) hours as needed for nausea.  12 each  3    Review of Systems  Constitutional: Negative for fever and chills.  Cardiovascular: Positive for chest pain and palpitations.  Gastrointestinal: Positive for heartburn. Negative for nausea, vomiting and abdominal pain.  Genitourinary: Negative for dysuria, urgency and frequency.       Neg for vaginal bleeding and discharge  Neurological: Negative for headaches.   Physical Exam   Blood pressure 130/89, pulse 91, temperature 97.9 F (  36.6 C), temperature source Oral, resp. rate 16, height 5\' 4"  (1.626 m), weight 239 lb 4 oz (108.523 kg), last menstrual period 06/24/2012, SpO2 99.00%.  Physical Exam  Constitutional: She appears well-developed and well-nourished. No distress.  HENT:  Head: Normocephalic.  Cardiovascular: Normal rate and regular rhythm.   Respiratory: Effort normal and breath sounds normal.  Skin: Skin is warm and dry.  Psychiatric: She has a normal mood and affect. Her behavior is normal.   Results for orders placed during the hospital encounter of 10/09/12 (from the past 24 hour(s))  URINALYSIS, ROUTINE W REFLEX MICROSCOPIC     Status: None   Collection Time    10/09/12  3:53 PM      Result Value Range   Color, Urine YELLOW  YELLOW   APPearance CLEAR  CLEAR   Specific  Gravity, Urine 1.010  1.005 - 1.030   pH 6.5  5.0 - 8.0   Glucose, UA NEGATIVE  NEGATIVE mg/dL   Hgb urine dipstick NEGATIVE  NEGATIVE   Bilirubin Urine NEGATIVE  NEGATIVE   Ketones, ur NEGATIVE  NEGATIVE mg/dL   Protein, ur NEGATIVE  NEGATIVE mg/dL   Urobilinogen, UA 0.2  0.0 - 1.0 mg/dL   Nitrite NEGATIVE  NEGATIVE   Leukocytes, UA NEGATIVE  NEGATIVE   MAU Course  Procedures  MDM 16:35  Reported MSE and EKG results to Dr. Clearance Coots.  Order given for GI cocktail now and Rx for home for Omeprazole 20mg  bid daily--not prn.   GI cocktail given and the patient is feeling better.  She would like to go home  Assessment and Plan  A:  GERD in second trimester pregnancy  P;  Rx for Omeprazole 20mg  bid for daily      Patient was instructed to avoid fried, spicy, greasy     Keep scheduled appointment with Dr. Lissa Hoard 10/09/2012, 4:12 PM

## 2012-10-14 ENCOUNTER — Ambulatory Visit (INDEPENDENT_AMBULATORY_CARE_PROVIDER_SITE_OTHER): Payer: BC Managed Care – PPO | Admitting: Obstetrics

## 2012-10-14 ENCOUNTER — Encounter (HOSPITAL_COMMUNITY): Payer: Self-pay | Admitting: *Deleted

## 2012-10-14 ENCOUNTER — Inpatient Hospital Stay (HOSPITAL_COMMUNITY)
Admission: AD | Admit: 2012-10-14 | Discharge: 2012-10-14 | Disposition: A | Discharge status: Home / Self Care | Payer: BC Managed Care – PPO | Source: Ambulatory Visit | Attending: Obstetrics & Gynecology | Admitting: Obstetrics & Gynecology

## 2012-10-14 ENCOUNTER — Encounter: Payer: Self-pay | Admitting: Obstetrics

## 2012-10-14 VITALS — BP 111/78 | Temp 98.6°F | Wt 240.0 lb

## 2012-10-14 DIAGNOSIS — Z3400 Encounter for supervision of normal first pregnancy, unspecified trimester: Secondary | ICD-10-CM

## 2012-10-14 DIAGNOSIS — K219 Gastro-esophageal reflux disease without esophagitis: Secondary | ICD-10-CM

## 2012-10-14 DIAGNOSIS — O99891 Other specified diseases and conditions complicating pregnancy: Secondary | ICD-10-CM | POA: Insufficient documentation

## 2012-10-14 DIAGNOSIS — Z3402 Encounter for supervision of normal first pregnancy, second trimester: Secondary | ICD-10-CM

## 2012-10-14 DIAGNOSIS — R109 Unspecified abdominal pain: Secondary | ICD-10-CM | POA: Insufficient documentation

## 2012-10-14 DIAGNOSIS — Z369 Encounter for antenatal screening, unspecified: Secondary | ICD-10-CM

## 2012-10-14 DIAGNOSIS — R51 Headache: Secondary | ICD-10-CM | POA: Insufficient documentation

## 2012-10-14 LAB — URINALYSIS, ROUTINE W REFLEX MICROSCOPIC
Bilirubin Urine: NEGATIVE
Glucose, UA: NEGATIVE mg/dL
Ketones, ur: 40 mg/dL — AB
Specific Gravity, Urine: >1.03 — ABNORMAL HIGH (ref 1.005–1.030)
pH: 5.5 (ref 5.0–8.0)

## 2012-10-14 LAB — COMPREHENSIVE METABOLIC PANEL
ALT: 92 U/L — ABNORMAL HIGH (ref 0–35)
AST: 61 U/L — ABNORMAL HIGH (ref 0–37)
Albumin: 3.1 g/dL — ABNORMAL LOW (ref 3.5–5.2)
Alkaline Phosphatase: 69 U/L (ref 39–117)
Calcium: 9.6 mg/dL (ref 8.4–10.5)
GFR calc Af Amer: >90 mL/min (ref >90)
Glucose, Bld: 74 mg/dL (ref 70–99)
Potassium: 3.3 mEq/L — ABNORMAL LOW (ref 3.5–5.1)
Sodium: 134 mEq/L — ABNORMAL LOW (ref 135–145)
Total Protein: 6 g/dL (ref 6.0–8.3)

## 2012-10-14 LAB — URINE MICROSCOPIC-ADD ON

## 2012-10-14 MED ORDER — PROMETHAZINE HCL 25 MG/ML IJ SOLN: INTRAMUSCULAR | Status: DC

## 2012-10-14 MED ORDER — ACETAMINOPHEN 325 MG PO TABS
650.0000 mg | ORAL_TABLET | Freq: Four times a day (QID) | ORAL | Status: DC | PRN
  Administered 2012-10-14: 650 mg via ORAL
  Filled 2012-10-14: qty 2

## 2012-10-14 MED ORDER — RANITIDINE HCL 150 MG PO TABS: 150.0000 mg | ORAL_TABLET | Freq: Two times a day (BID) | ORAL | Status: DC

## 2012-10-14 MED ORDER — M.V.I. ADULT IV INJ
Freq: Once | INTRAVENOUS | Status: AC
  Administered 2012-10-14: 20:00:00 via INTRAVENOUS
  Filled 2012-10-14: qty 1000

## 2012-10-14 MED ORDER — PROMETHAZINE HCL 25 MG/ML IJ SOLN
Freq: Once | INTRAVENOUS | Status: AC
  Administered 2012-10-14: 20:00:00 via INTRAVENOUS
  Filled 2012-10-14: qty 1000

## 2012-10-14 NOTE — Progress Notes (Signed)
P- 85 Pt states she is having some pain in her lower back and hips. Pt states she is also having tightening in her abdomen.  Pt states she has a skin tag on her right breast. Pt states she has a history of a breast biopsy on her left breast.  Pt is requesting a referral to a dermatologist.

## 2012-10-14 NOTE — MAU Note (Signed)
Patient discharged home with instructions for morning sickness and second trimester pregnancy, Patient instructed per Dr. Tamela Oddi to limit tylenol due to elevated LFTs. Patient has phenergan, compazine and Zofran at home that she can continue to take for n/v. Per pharmacy these medication would not increase LFTs. Patient verbalize understanding of discharge instructions and to keep next scheduled appointment.

## 2012-10-14 NOTE — MAU Provider Note (Signed)
History     CSN: [id number]  Arrival date and time: 10/14/12 1744   None     Chief Complaint  Patient presents with  . Emesis During Pregnancy  . Abdominal Pain  . Headache   HPI Comments: Misty Walsh 22 y.o. G1P0 was sent to MAU from Dr Thomes Lolling office for IV fluids and labs. She was seen in office today. She has had nausea and vomiting in this pregnancy. She regained 10 lbs lately.     Patient is a 22 y.o. female presenting with abdominal pain and headaches.  Abdominal Pain The primary symptoms of the illness include abdominal pain.  Headache The primary symptoms include headaches.      Past Medical History  Diagnosis Date  . Hypertension   . Ovarian cyst   . Anxiety   . Chlamydia   . Depression med made her navel itching and  made her sleepy so she quit taking them  . GERD (gastroesophageal reflux disease)     Past Surgical History  Procedure Laterality Date  . Breast biopsy      benign    Family History  Problem Relation Age of Onset  . Diabetes Mother   . Heart disease Mother   . Hypertension Mother   . Depression Mother   . Stroke Mother   . Arthritis Mother   . Stroke Father   . Asthma Sister   . Kidney disease Brother     genetic condition  . Diabetes Maternal Aunt   . Hypertension Maternal Aunt   . Asthma Maternal Aunt   . Epilepsy Maternal Aunt   . Hypertension Maternal Grandmother   . Dementia Maternal Grandmother   . Heart disease Maternal Grandmother   . Diabetes Paternal Grandmother     History  Substance Use Topics  . Smoking status: Former Smoker -- 0.25 packs/day for 5 years    Types: Cigarettes    Quit date: 07/19/2012  . Smokeless tobacco: Not on file  . Alcohol Use: No     Comment: occasionallly    Allergies: No Known Allergies  Prescriptions prior to admission  Medication Sig Dispense Refill  . glycopyrrolate (ROBINUL) 1 MG tablet Take 2 mg by mouth 3 (three) times daily as needed (for spitting).      Marland Kitchen  omeprazole (PRILOSEC) 20 MG capsule Take 1 capsule (20 mg total) by mouth 2 (two) times daily.  60 capsule  0  . ondansetron (ZOFRAN ODT) 8 MG disintegrating tablet Take 1 tablet (8 mg total) by mouth every 8 (eight) hours as needed for nausea.  30 tablet  3  . prochlorperazine (COMPAZINE) 25 MG suppository Place 1 suppository (25 mg total) rectally every 12 (twelve) hours as needed for nausea.  12 suppository  0  . promethazine (PHENERGAN) 12.5 MG suppository Place 1 suppository (12.5 mg total) rectally every 6 (six) hours as needed for nausea.  12 each  3  . Pyridoxine HCl (VITAMIN B-6 PO) Take 1 tablet by mouth daily.        Review of Systems  Constitutional: Negative.   Gastrointestinal: Positive for abdominal pain.  Neurological: Positive for headaches.   Physical Exam   Blood pressure 117/82, pulse 105, temperature 98.6 F (37 C), temperature source Oral, resp. rate 20, weight 239 lb (108.41 kg), last menstrual period 06/24/2012, SpO2 100.00%.  Physical Exam  Constitutional: She is oriented to person, place, and time. She appears well-developed and well-nourished.  HENT:  Head: Normocephalic and atraumatic.  GI:  sitting up in bed drinking soda. No Nausea or vomiting at this time.  Neurological: She is alert and oriented to person, place, and time.  Skin: Skin is warm and dry.    MAU Course  Procedures  MDM RN Leotis Shames will call Dr Gaynell Face when second bag IV fluids are in and give update on condition  Assessment and Plan    Steffen Hase, Rubbie Battiest 10/14/2012, 7:11 PM

## 2012-10-14 NOTE — MAU Note (Signed)
Patient states she was sent from the office for IV fluids. Has been having nausea and vomiting, abdominal pain and headache.

## 2012-10-15 LAB — AFP, QUAD SCREEN
Age Alone: 1:1120 {titer}
Down Syndrome Scr Risk Est: 1:4810 {titer}
MoM for AFP: 1.71
MoM for INH: 1.65
MoM for hCG: 2.3
Tri 18 Scr Risk Est: NEGATIVE
Trisomy 18 (Edward) Syndrome Interp.: <1:149000 {titer}

## 2012-10-15 LAB — POCT URINALYSIS DIPSTICK
Bilirubin, UA: NEGATIVE
Blood, UA: NEGATIVE
Nitrite, UA: NEGATIVE
Spec Grav, UA: 1.02
Urobilinogen, UA: NEGATIVE
pH, UA: 5

## 2012-10-16 ENCOUNTER — Other Ambulatory Visit: Payer: Self-pay | Admitting: *Deleted

## 2012-10-16 DIAGNOSIS — N390 Urinary tract infection, site not specified: Secondary | ICD-10-CM

## 2012-10-16 MED ORDER — AMOXICILLIN-POT CLAVULANATE 875-125 MG PO TABS: 1.0000 | ORAL_TABLET | Freq: Two times a day (BID) | ORAL | Status: DC

## 2012-10-17 ENCOUNTER — Encounter: Payer: Self-pay | Admitting: Obstetrics & Gynecology

## 2012-10-17 DIAGNOSIS — O9989 Other specified diseases and conditions complicating pregnancy, childbirth and the puerperium: Secondary | ICD-10-CM

## 2012-10-17 DIAGNOSIS — R8271 Bacteriuria: Secondary | ICD-10-CM | POA: Insufficient documentation

## 2012-10-17 LAB — URINE CULTURE: Colony Count: >=100000

## 2012-10-20 ENCOUNTER — Inpatient Hospital Stay (HOSPITAL_COMMUNITY)
Admission: AD | Admit: 2012-10-20 | Discharge: 2012-10-20 | Disposition: A | Discharge status: Home / Self Care | Payer: BC Managed Care – PPO | Source: Ambulatory Visit | Attending: Obstetrics & Gynecology | Admitting: Obstetrics & Gynecology

## 2012-10-20 ENCOUNTER — Encounter (HOSPITAL_COMMUNITY): Payer: Self-pay | Admitting: *Deleted

## 2012-10-20 DIAGNOSIS — R51 Headache: Secondary | ICD-10-CM | POA: Insufficient documentation

## 2012-10-20 DIAGNOSIS — O210 Mild hyperemesis gravidarum: Secondary | ICD-10-CM | POA: Insufficient documentation

## 2012-10-20 DIAGNOSIS — O218 Other vomiting complicating pregnancy: Secondary | ICD-10-CM

## 2012-10-20 DIAGNOSIS — K219 Gastro-esophageal reflux disease without esophagitis: Secondary | ICD-10-CM

## 2012-10-20 DIAGNOSIS — O9989 Other specified diseases and conditions complicating pregnancy, childbirth and the puerperium: Secondary | ICD-10-CM

## 2012-10-20 DIAGNOSIS — O26892 Other specified pregnancy related conditions, second trimester: Secondary | ICD-10-CM

## 2012-10-20 DIAGNOSIS — O211 Hyperemesis gravidarum with metabolic disturbance: Secondary | ICD-10-CM

## 2012-10-20 DIAGNOSIS — Z22330 Carrier of Group B streptococcus: Secondary | ICD-10-CM

## 2012-10-20 DIAGNOSIS — N390 Urinary tract infection, site not specified: Secondary | ICD-10-CM

## 2012-10-20 LAB — URINALYSIS, ROUTINE W REFLEX MICROSCOPIC
Leukocytes, UA: NEGATIVE
Nitrite: NEGATIVE
Protein, ur: 30 mg/dL — AB
Specific Gravity, Urine: >1.03 — ABNORMAL HIGH (ref 1.005–1.030)
Urobilinogen, UA: 1 mg/dL (ref 0.0–1.0)

## 2012-10-20 LAB — URINE MICROSCOPIC-ADD ON

## 2012-10-20 MED ORDER — PROCHLORPERAZINE EDISYLATE 5 MG/ML IJ SOLN
10.0000 mg | Freq: Once | INTRAMUSCULAR | Status: AC
  Administered 2012-10-20: 10 mg via INTRAVENOUS
  Filled 2012-10-20 (×2): qty 2

## 2012-10-20 MED ORDER — DEXAMETHASONE SODIUM PHOSPHATE 10 MG/ML IJ SOLN
10.0000 mg | Freq: Once | INTRAMUSCULAR | Status: AC
  Administered 2012-10-20: 10 mg via INTRAVENOUS
  Filled 2012-10-20: qty 1

## 2012-10-20 MED ORDER — DIPHENHYDRAMINE HCL 50 MG/ML IJ SOLN
12.5000 mg | Freq: Once | INTRAMUSCULAR | Status: AC
  Administered 2012-10-20: 13:00:00 via INTRAVENOUS
  Filled 2012-10-20: qty 1

## 2012-10-20 MED ORDER — HYDROMORPHONE HCL 2 MG PO TABS
2.0000 mg | ORAL_TABLET | Freq: Once | ORAL | Status: AC
  Administered 2012-10-20: 2 mg via ORAL
  Filled 2012-10-20: qty 1

## 2012-10-20 MED ORDER — PANTOPRAZOLE SODIUM 40 MG PO TBEC
40.0000 mg | DELAYED_RELEASE_TABLET | Freq: Every day | ORAL | Status: DC
  Administered 2012-10-20: 40 mg via ORAL
  Filled 2012-10-20 (×2): qty 1

## 2012-10-20 MED ORDER — LACTATED RINGERS IV SOLN
INTRAVENOUS | Status: DC
  Administered 2012-10-20: 14:00:00 via INTRAVENOUS

## 2012-10-20 MED ORDER — ONDANSETRON 8 MG PO TBDP
8.0000 mg | ORAL_TABLET | Freq: Once | ORAL | Status: AC
  Administered 2012-10-20: 8 mg via ORAL
  Filled 2012-10-20: qty 1

## 2012-10-20 MED ORDER — CYCLOBENZAPRINE HCL 10 MG PO TABS
10.0000 mg | ORAL_TABLET | Freq: Once | ORAL | Status: AC
  Administered 2012-10-20: 10 mg via ORAL
  Filled 2012-10-20: qty 1

## 2012-10-20 NOTE — MAU Note (Signed)
Patient states she been vomiting all day and all night. Woke up out of sleep choking on vomit. Complains of body aches, diarrhea and heartburn. Headache for the last week.

## 2012-10-20 NOTE — MAU Provider Note (Addendum)
History     CSN: [id number]  Arrival date and time: 10/20/12 7829   First Provider Initiated Contact with Patient 10/20/12 0740      Chief Complaint  Patient presents with  . Headache  . Emesis During Pregnancy  . Diarrhea   Headache   Diarrhea  Associated symptoms include headaches.    Misty Walsh is a 22 y.o. G1P0 at [redacted]w[redacted]d who presents today with nausea and vomiting, and a headache. She state that she has had elevated liver enzymes and cannot take tylenol. So, she has not taken anything for the headache. She also states that none of the nausea medicines are helping her.   Past Medical History  Diagnosis Date  . Hypertension   . Ovarian cyst   . Anxiety   . Chlamydia   . Depression med made her navel itching and  made her sleepy so she quit taking them  . GERD (gastroesophageal reflux disease)     Past Surgical History  Procedure Laterality Date  . Breast biopsy      benign    Family History  Problem Relation Age of Onset  . Diabetes Mother   . Heart disease Mother   . Hypertension Mother   . Depression Mother   . Stroke Mother   . Arthritis Mother   . Stroke Father   . Asthma Sister   . Kidney disease Brother     genetic condition  . Diabetes Maternal Aunt   . Hypertension Maternal Aunt   . Asthma Maternal Aunt   . Epilepsy Maternal Aunt   . Hypertension Maternal Grandmother   . Dementia Maternal Grandmother   . Heart disease Maternal Grandmother   . Diabetes Paternal Grandmother     History  Substance Use Topics  . Smoking status: Former Smoker -- 0.25 packs/day for 5 years    Types: Cigarettes    Quit date: 07/19/2012  . Smokeless tobacco: Not on file  . Alcohol Use: No     Comment: occasionallly    Allergies: No Known Allergies  Prescriptions prior to admission  Medication Sig Dispense Refill  . glycopyrrolate (ROBINUL) 1 MG tablet Take 2 mg by mouth 3 (three) times daily as needed (for spitting).      Marland Kitchen omeprazole (PRILOSEC) 20 MG  capsule Take 1 capsule (20 mg total) by mouth 2 (two) times daily.  60 capsule  0  . ondansetron (ZOFRAN ODT) 8 MG disintegrating tablet Take 1 tablet (8 mg total) by mouth every 8 (eight) hours as needed for nausea.  30 tablet  3  . promethazine (PHENERGAN) 12.5 MG suppository Place 1 suppository (12.5 mg total) rectally every 6 (six) hours as needed for nausea.  12 each  3  . Pyridoxine HCl (VITAMIN B-6 PO) Take 1 tablet by mouth daily.      . ranitidine (ZANTAC) 150 MG tablet Take 1 tablet (150 mg total) by mouth 2 (two) times daily.  60 tablet  5    Review of Systems  Gastrointestinal: Positive for diarrhea.  Neurological: Positive for headaches.   Physical Exam   Blood pressure 115/68, pulse 94, temperature 98.7 F (37.1 C), temperature source Oral, resp. rate 20, height 5\' 5"  (1.651 m), weight 105.688 kg (233 lb), last menstrual period 06/24/2012.  Physical Exam  Nursing note and vitals reviewed. Constitutional: She is oriented to person, place, and time. She appears well-developed and well-nourished. No distress.  Cardiovascular: Normal rate.   Respiratory: Effort normal.  GI: Soft. There is  no tenderness.  Neurological: She is alert and oriented to person, place, and time.  Skin: Skin is warm.  Psychiatric: She has a normal mood and affect.    MAU Course  Procedures   Assessment and Plan  0800 Care turned over to W. Muhammed, CNM  Tawnya Crook 10/20/2012, 7:50 AM   [phone] Order for flexeril  1215 Pt reports minimal relief from headache and nausea with flexeril and dilaudid.  > Begin IV and give headache cocktail (benadryl, compazine, decadron). 1320  Reports improvement in headache, continues to have reflux > declines GI cocktail 1400 Reports relief in both GERD and headache symptoms and ready for discharge home.  A:  Nausea and Vomiting in Pregnancy Headache in Pregnancy GERD  P: Discharge to home Continue medications Keep scheduled  appointment  [facility]

## 2012-10-27 ENCOUNTER — Encounter (HOSPITAL_COMMUNITY): Payer: Self-pay | Admitting: *Deleted

## 2012-10-27 ENCOUNTER — Encounter: Payer: Self-pay | Admitting: Obstetrics

## 2012-10-27 ENCOUNTER — Inpatient Hospital Stay (HOSPITAL_COMMUNITY)
Admission: AD | Admit: 2012-10-27 | Discharge: 2012-10-27 | Disposition: A | Discharge status: Home / Self Care | Payer: BC Managed Care – PPO | Source: Ambulatory Visit | Attending: Obstetrics | Admitting: Obstetrics

## 2012-10-27 DIAGNOSIS — R079 Chest pain, unspecified: Secondary | ICD-10-CM | POA: Insufficient documentation

## 2012-10-27 DIAGNOSIS — K219 Gastro-esophageal reflux disease without esophagitis: Secondary | ICD-10-CM

## 2012-10-27 DIAGNOSIS — O99891 Other specified diseases and conditions complicating pregnancy: Secondary | ICD-10-CM | POA: Insufficient documentation

## 2012-10-27 DIAGNOSIS — R748 Abnormal levels of other serum enzymes: Secondary | ICD-10-CM

## 2012-10-27 DIAGNOSIS — R109 Unspecified abdominal pain: Secondary | ICD-10-CM | POA: Insufficient documentation

## 2012-10-27 DIAGNOSIS — O210 Mild hyperemesis gravidarum: Secondary | ICD-10-CM | POA: Insufficient documentation

## 2012-10-27 LAB — COMPREHENSIVE METABOLIC PANEL
ALT: 64 U/L — ABNORMAL HIGH (ref 0–35)
AST: 65 U/L — ABNORMAL HIGH (ref 0–37)
Albumin: 3.1 g/dL — ABNORMAL LOW (ref 3.5–5.2)
Alkaline Phosphatase: 75 U/L (ref 39–117)
BUN: 3 mg/dL — ABNORMAL LOW (ref 6–23)
CO2: 21 mEq/L (ref 19–32)
Calcium: 9.9 mg/dL (ref 8.4–10.5)
Chloride: 103 mEq/L (ref 96–112)
Creatinine, Ser: 0.52 mg/dL (ref 0.50–1.10)
GFR calc Af Amer: >90 mL/min (ref >90)
GFR calc non Af Amer: >90 mL/min (ref >90)
Glucose, Bld: 81 mg/dL (ref 70–99)
Potassium: 3.4 mEq/L — ABNORMAL LOW (ref 3.5–5.1)
Sodium: 135 mEq/L (ref 135–145)
Total Bilirubin: 0.4 mg/dL (ref 0.3–1.2)
Total Protein: 6.6 g/dL (ref 6.0–8.3)

## 2012-10-27 LAB — URINALYSIS, ROUTINE W REFLEX MICROSCOPIC
Bilirubin Urine: NEGATIVE
Glucose, UA: NEGATIVE mg/dL
Hgb urine dipstick: NEGATIVE
Ketones, ur: NEGATIVE mg/dL
Nitrite: NEGATIVE
Protein, ur: NEGATIVE mg/dL
Specific Gravity, Urine: 1.025 (ref 1.005–1.030)
Urobilinogen, UA: 0.2 mg/dL (ref 0.0–1.0)
pH: 7 (ref 5.0–8.0)

## 2012-10-27 LAB — CBC
HCT: 34.9 % — ABNORMAL LOW (ref 36.0–46.0)
Hemoglobin: 12.1 g/dL (ref 12.0–15.0)
MCH: 31.8 pg (ref 26.0–34.0)
MCHC: 34.7 g/dL (ref 30.0–36.0)
MCV: 91.8 fL (ref 78.0–100.0)

## 2012-10-27 LAB — URINE MICROSCOPIC-ADD ON

## 2012-10-27 MED ORDER — GLYCOPYRROLATE 1 MG PO TABS
2.0000 mg | ORAL_TABLET | ORAL | Status: AC
  Administered 2012-10-27: 2 mg via ORAL
  Filled 2012-10-27: qty 2

## 2012-10-27 MED ORDER — PROMETHAZINE HCL 25 MG/ML IJ SOLN
25.0000 mg | INTRAMUSCULAR | Status: AC
  Administered 2012-10-27: 25 mg via INTRAMUSCULAR
  Filled 2012-10-27: qty 1

## 2012-10-27 MED ORDER — PROMETHAZINE HCL 25 MG RE SUPP: 25.0000 mg | Freq: Four times a day (QID) | RECTAL | Status: DC | PRN

## 2012-10-27 NOTE — MAU Provider Note (Signed)
Chief Complaint: Emesis During Pregnancy, Abdominal Pain, Sore Throat and Generalized Body Aches   First Provider Initiated Contact with Patient 10/27/12 0912     SUBJECTIVE HPI: Misty Walsh is a 22 y.o. G1P0 at [redacted]w[redacted]d by LMP who presents to maternity admissions reporting nausea daily with vomiting x8 in last 24 hours described as small amounts, mostly spitting, burning in her throat and chest, and body aches and abdominal cramping.  The n/v have been present throughout the pregnancy with some of the burning chest/throat symptoms off and on, but the body aches and abdominal cramping are new onset yesterday.  She has not taken any nausea medicine today but took Zofran and Phenergan yesterday.  She took her Zantac this morning but has been alternating with Prilosec some days and Zantac some days.  She denies LOF, vaginal bleeding, vaginal itching/burning, urinary symptoms, h/a, dizziness, or fever/chills.     Past Medical History  Diagnosis Date  . Hypertension   . Ovarian cyst   . Anxiety   . Chlamydia   . Depression med made her navel itching and  made her sleepy so she quit taking them  . GERD (gastroesophageal reflux disease)    Past Surgical History  Procedure Laterality Date  . Breast biopsy      benign   History   Social History  . Marital Status: Single    Spouse Name: N/A    Number of Children: N/A  . Years of Education: N/A   Occupational History  . Not on file.   Social History Main Topics  . Smoking status: Former Smoker -- 0.25 packs/day for 5 years    Types: Cigarettes    Quit date: 07/19/2012  . Smokeless tobacco: Not on file  . Alcohol Use: No     Comment: occasionallly  . Drug Use: No  . Sexual Activity: Yes     Comment: sometimes   Other Topics Concern  . Not on file   Social History Narrative  . No narrative on file   No current facility-administered medications on file prior to encounter.   Current Outpatient Prescriptions on File Prior to  Encounter  Medication Sig Dispense Refill  . glycopyrrolate (ROBINUL) 1 MG tablet Take 2 mg by mouth 3 (three) times daily as needed (for spitting).      Marland Kitchen omeprazole (PRILOSEC) 20 MG capsule Take 1 capsule (20 mg total) by mouth 2 (two) times daily.  60 capsule  0  . ondansetron (ZOFRAN ODT) 8 MG disintegrating tablet Take 1 tablet (8 mg total) by mouth every 8 (eight) hours as needed for nausea.  30 tablet  3  . promethazine (PHENERGAN) 12.5 MG suppository Place 1 suppository (12.5 mg total) rectally every 6 (six) hours as needed for nausea.  12 each  3  . Pyridoxine HCl (VITAMIN B-6 PO) Take 1 tablet by mouth daily.      . ranitidine (ZANTAC) 150 MG tablet Take 1 tablet (150 mg total) by mouth 2 (two) times daily.  60 tablet  5   No Known Allergies  ROS: Pertinent items in HPI  OBJECTIVE Blood pressure 119/78, pulse 93, temperature 98.3 F (36.8 C), temperature source Oral, resp. rate 20, height 5\' 3"  (1.6 m), weight 106.867 kg (235 lb 9.6 oz), last menstrual period 06/24/2012, SpO2 99.00%. GENERAL: Well-developed, well-nourished female in no acute distress.  HEENT: Normocephalic HEART: normal rate RESP: normal effort ABDOMEN: Soft, non-tender EXTREMITIES: Nontender, no edema NEURO: Alert and oriented SPECULUM EXAM: Deferred  FHT  present on doppler  LAB RESULTS Results for orders placed in visit on 10/29/12 (from the past 168 hour(s))  POCT URINALYSIS DIPSTICK   Collection Time    10/29/12 11:07 AM      Result Value Range   Color, UA YELLOW     Clarity, UA CLEAR     Glucose, UA NEGATIVE     Bilirubin, UA NEGATIVE     Ketones, UA NEGATIVE     Spec Grav, UA >=1.030     Blood, UA NEGATIVE     pH, UA 5.0     Protein, UA 1+     Urobilinogen, UA negative     Nitrite, UA NEGATIVE     Leukocytes, UA Negative    Results for orders placed during the hospital encounter of 10/27/12 (from the past 168 hour(s))  URINE CULTURE   Collection Time    10/27/12  9:05 AM      Result  Value Range   Specimen Description URINE, CLEAN CATCH     Special Requests NONE     Culture  Setup Time       Value: 10/27/2012 14:36     Performed at [facility] Count       Value: >=100,000 COLONIES/ML     Performed at [facility]   Culture       Value: GROUP B STREP(S.AGALACTIAE)ISOLATED     Note: TESTING AGAINST S. AGALACTIAE NOT ROUTINELY PERFORMED DUE TO PREDICTABILITY OF AMP/PEN/VAN SUSCEPTIBILITY.     Performed at [facility]   Report Status 10/28/2012 FINAL    URINALYSIS, ROUTINE W REFLEX MICROSCOPIC   Collection Time    10/27/12  9:05 AM      Result Value Range   Color, Urine YELLOW  YELLOW   APPearance HAZY (*) CLEAR   Specific Gravity, Urine 1.025  1.005 - 1.030   pH 7.0  5.0 - 8.0   Glucose, UA NEGATIVE  NEGATIVE mg/dL   Hgb urine dipstick NEGATIVE  NEGATIVE   Bilirubin Urine NEGATIVE  NEGATIVE   Ketones, ur NEGATIVE  NEGATIVE mg/dL   Protein, ur NEGATIVE  NEGATIVE mg/dL   Urobilinogen, UA 0.2  0.0 - 1.0 mg/dL   Nitrite NEGATIVE  NEGATIVE   Leukocytes, UA SMALL (*) NEGATIVE  URINE MICROSCOPIC-ADD ON   Collection Time    10/27/12  9:05 AM      Result Value Range   Squamous Epithelial / LPF MANY (*) RARE   WBC, UA 7-10  <3 WBC/hpf   Bacteria, UA FEW (*) RARE   Urine-Other MUCOUS PRESENT    CBC   Collection Time    10/27/12  9:33 AM      Result Value Range   WBC 9.3  4.0 - 10.5 K/uL   RBC 3.80 (*) 3.87 - 5.11 MIL/uL   Hemoglobin 12.1  12.0 - 15.0 g/dL   HCT 16.1 (*) 09.6 - 04.5 %   MCV 91.8  78.0 - 100.0 fL   MCH 31.8  26.0 - 34.0 pg   MCHC 34.7  30.0 - 36.0 g/dL   RDW 40.9  81.1 - 91.4 %   Platelets 214  150 - 400 K/uL  COMPREHENSIVE METABOLIC PANEL   Collection Time    10/27/12  9:33 AM      Result Value Range   Sodium 135  135 - 145 mEq/L   Potassium 3.4 (*) 3.5 - 5.1 mEq/L   Chloride 103  96 - 112 mEq/L  CO2 21  19 - 32 mEq/L   Glucose, Bld 81  70 - 99 mg/dL   BUN 3 (*) 6 - 23 mg/dL   Creatinine, Ser 1.91   0.50 - 1.10 mg/dL   Calcium 9.9  8.4 - 47.8 mg/dL   Total Protein 6.6  6.0 - 8.3 g/dL   Albumin 3.1 (*) 3.5 - 5.2 g/dL   AST 65 (*) 0 - 37 U/L   ALT 64 (*) 0 - 35 U/L   Alkaline Phosphatase 75  39 - 117 U/L   Total Bilirubin 0.4  0.3 - 1.2 mg/dL   GFR calc non Af Amer >90  >90 mL/min   GFR calc Af Amer >90  >90 mL/min  HEPATITIS PANEL, ACUTE   Collection Time    10/27/12 12:01 PM      Result Value Range   Hepatitis B Surface Ag NEGATIVE  NEGATIVE   HCV Ab NEGATIVE  NEGATIVE   Hep A IgM NEGATIVE  NEGATIVE   Hep B C IgM NEGATIVE  NEGATIVE    ASSESSMENT 1. Elevated liver enzymes   2. GERD without esophagitis     PLAN Consult with Dr Clearance Coots Hepatitis panel Discharge home Prilosec daily instead of intermittently Robinul daily for spitting Phenergan Q 6 hours PRN nausea F/U as scheduled with Dr Clearance Coots Return to MAU as needed     Medication List    ASK your doctor about these medications       glycopyrrolate 1 MG tablet  Commonly known as:  ROBINUL  Take 2 mg by mouth 3 (three) times daily as needed (for spitting).     omeprazole 20 MG capsule  Commonly known as:  PRILOSEC  Take 1 capsule (20 mg total) by mouth 2 (two) times daily.     ondansetron 8 MG disintegrating tablet  Commonly known as:  ZOFRAN ODT  Take 1 tablet (8 mg total) by mouth every 8 (eight) hours as needed for nausea.     promethazine 12.5 MG suppository  Commonly known as:  PHENERGAN  Place 1 suppository (12.5 mg total) rectally every 6 (six) hours as needed for nausea.     ranitidine 150 MG tablet  Commonly known as:  ZANTAC  Take 1 tablet (150 mg total) by mouth 2 (two) times daily.     VITAMIN B-6 PO  Take 1 tablet by mouth daily.         Sharen Counter Certified Nurse-Midwife 10/27/2012  9:28 AM

## 2012-10-27 NOTE — MAU Note (Addendum)
Patient c/o of nausea and vomiting since yesterday. States that she has vomited 8 times in past 24 hours. Patient reports epigastric burning feeling and a cough. Patient denies vaginal bleeding and lof. Patient is taking some anti-emetic and anti-acid medications inconsistently at home.

## 2012-10-27 NOTE — MAU Note (Signed)
Patient states she has nausea and vomiting all the time and the medication she has is not working. States she has a sore throat and points to the epigastric area and states chest pain. Has had general all over body aches off and on and some mild abdominal pain. Denies bleeding or discharge.

## 2012-10-28 LAB — URINE CULTURE

## 2012-10-28 LAB — HEPATITIS PANEL, ACUTE
HCV Ab: NEGATIVE
Hepatitis B Surface Ag: NEGATIVE

## 2012-10-29 ENCOUNTER — Ambulatory Visit (INDEPENDENT_AMBULATORY_CARE_PROVIDER_SITE_OTHER): Payer: BC Managed Care – PPO | Admitting: Obstetrics

## 2012-10-29 VITALS — BP 126/86 | Temp 98.2°F | Wt 236.0 lb

## 2012-10-29 DIAGNOSIS — N390 Urinary tract infection, site not specified: Secondary | ICD-10-CM

## 2012-10-29 DIAGNOSIS — O219 Vomiting of pregnancy, unspecified: Secondary | ICD-10-CM

## 2012-10-29 DIAGNOSIS — T7840XA Allergy, unspecified, initial encounter: Secondary | ICD-10-CM

## 2012-10-29 DIAGNOSIS — Z3402 Encounter for supervision of normal first pregnancy, second trimester: Secondary | ICD-10-CM

## 2012-10-29 DIAGNOSIS — Z3400 Encounter for supervision of normal first pregnancy, unspecified trimester: Secondary | ICD-10-CM

## 2012-10-29 DIAGNOSIS — O210 Mild hyperemesis gravidarum: Secondary | ICD-10-CM

## 2012-10-29 LAB — POCT URINALYSIS DIPSTICK
Bilirubin, UA: NEGATIVE
Blood, UA: NEGATIVE
Glucose, UA: NEGATIVE
Ketones, UA: NEGATIVE
Leukocytes, UA: NEGATIVE
Nitrite, UA: NEGATIVE
Spec Grav, UA: >=1.03
Urobilinogen, UA: NEGATIVE
pH, UA: 5

## 2012-10-29 MED ORDER — LORATADINE 10 MG PO TABS: 10.0000 mg | ORAL_TABLET | Freq: Every day | ORAL | Status: DC

## 2012-10-29 MED ORDER — MECLIZINE HCL 25 MG PO CHEW: 1.0000 | CHEWABLE_TABLET | Freq: Three times a day (TID) | ORAL | Status: DC

## 2012-10-29 MED ORDER — AMOXICILLIN 500 MG PO CAPS: 500.0000 mg | ORAL_CAPSULE | Freq: Three times a day (TID) | ORAL | Status: DC

## 2012-10-29 NOTE — Progress Notes (Signed)
Pulse- 96 

## 2012-11-01 ENCOUNTER — Encounter (HOSPITAL_COMMUNITY): Payer: Self-pay | Admitting: *Deleted

## 2012-11-01 ENCOUNTER — Inpatient Hospital Stay (HOSPITAL_COMMUNITY)
Admission: AD | Admit: 2012-11-01 | Discharge: 2012-11-02 | Disposition: A | Discharge status: Home / Self Care | Payer: Medicaid Other | Source: Ambulatory Visit | Attending: Obstetrics | Admitting: Obstetrics

## 2012-11-01 DIAGNOSIS — O99891 Other specified diseases and conditions complicating pregnancy: Secondary | ICD-10-CM | POA: Insufficient documentation

## 2012-11-01 DIAGNOSIS — K644 Residual hemorrhoidal skin tags: Secondary | ICD-10-CM

## 2012-11-01 DIAGNOSIS — O211 Hyperemesis gravidarum with metabolic disturbance: Secondary | ICD-10-CM

## 2012-11-01 DIAGNOSIS — N390 Urinary tract infection, site not specified: Secondary | ICD-10-CM

## 2012-11-01 DIAGNOSIS — O210 Mild hyperemesis gravidarum: Secondary | ICD-10-CM | POA: Insufficient documentation

## 2012-11-01 DIAGNOSIS — Z22330 Carrier of Group B streptococcus: Secondary | ICD-10-CM

## 2012-11-01 DIAGNOSIS — O228X9 Other venous complications in pregnancy, unspecified trimester: Secondary | ICD-10-CM | POA: Insufficient documentation

## 2012-11-01 MED ORDER — HYDROCORTISONE ACETATE 25 MG RE SUPP: 25.0000 mg | Freq: Two times a day (BID) | RECTAL | Status: DC

## 2012-11-01 MED ORDER — HYDROCORTISONE ACETATE 25 MG RE SUPP
25.0000 mg | Freq: Once | RECTAL | Status: AC
  Administered 2012-11-02: 25 mg via RECTAL
  Filled 2012-11-01: qty 1

## 2012-11-01 NOTE — MAU Note (Signed)
Pt reports rectal bleeding about 3 hours. Denies pain.

## 2012-11-01 NOTE — MAU Provider Note (Signed)
History     CSN: [id number]  Arrival date and time: 11/01/12 2251   First Provider Initiated Contact with Patient 11/01/12 2323      Chief Complaint  Patient presents with  . Rectal Bleeding   Rectal Bleeding     Misty Walsh is a 22 y.o. G1P0 at [redacted]w[redacted]d who presents tonight with rectal bleeding. She states that around 2000 tonight she saw blood when wiping. She was able to determine that it was not vaginal bleeding. She states that it was only present when wiping her rectum. She denies any itching, pain, burning or recent anal intercourse. She is not sure if she has a hemorrhoid, but denies any constipation. Her last BM was this morning.  Past Medical History  Diagnosis Date  . Hypertension   . Ovarian cyst   . Anxiety   . Chlamydia   . Depression med made her navel itching and  made her sleepy so she quit taking them  . GERD (gastroesophageal reflux disease)     Past Surgical History  Procedure Laterality Date  . Breast biopsy      benign    Family History  Problem Relation Age of Onset  . Diabetes Mother   . Heart disease Mother   . Hypertension Mother   . Depression Mother   . Stroke Mother   . Arthritis Mother   . Stroke Father   . Asthma Sister   . Kidney disease Brother     genetic condition  . Diabetes Maternal Aunt   . Hypertension Maternal Aunt   . Asthma Maternal Aunt   . Epilepsy Maternal Aunt   . Hypertension Maternal Grandmother   . Dementia Maternal Grandmother   . Heart disease Maternal Grandmother   . Diabetes Paternal Grandmother     History  Substance Use Topics  . Smoking status: Former Smoker -- 0.25 packs/day for 5 years    Types: Cigarettes    Quit date: 07/19/2012  . Smokeless tobacco: Not on file  . Alcohol Use: No     Comment: occasionallly    Allergies: No Known Allergies  Prescriptions prior to admission  Medication Sig Dispense Refill  . amoxicillin (AMOXIL) 500 MG capsule Take 1 capsule (500 mg total) by mouth 3  (three) times daily.  15 capsule  0  . glycopyrrolate (ROBINUL) 1 MG tablet Take 2 mg by mouth 3 (three) times daily as needed (for spitting).      Marland Kitchen loratadine (CLARITIN) 10 MG tablet Take 1 tablet (10 mg total) by mouth daily.  30 tablet  11  . Meclizine HCl (BONINE) 25 MG CHEW Chew 1 tablet (25 mg total) by mouth 3 (three) times daily.  90 each  5  . omeprazole (PRILOSEC) 20 MG capsule Take 1 capsule (20 mg total) by mouth 2 (two) times daily.  60 capsule  0  . ondansetron (ZOFRAN ODT) 8 MG disintegrating tablet Take 1 tablet (8 mg total) by mouth every 8 (eight) hours as needed for nausea.  30 tablet  3  . promethazine (PHENERGAN) 12.5 MG tablet Take 25 mg by mouth every 6 (six) hours as needed for nausea.      . promethazine (PHENERGAN) 25 MG suppository Place 1 suppository (25 mg total) rectally every 6 (six) hours as needed for nausea.  12 each  0    Review of Systems  Gastrointestinal: Positive for hematochezia.   Physical Exam   Blood pressure 132/81, pulse 85, temperature 98 F (36.7 C), temperature  source Oral, resp. rate 18, height 5\' 4"  (1.626 m), weight 109.317 kg (241 lb), last menstrual period 06/24/2012, SpO2 99.00%.  Physical Exam  Nursing note and vitals reviewed. Constitutional: She is oriented to person, place, and time. She appears well-developed and well-nourished. No distress.  Cardiovascular: Normal rate.   Respiratory: Effort normal.  GI: Soft.  Genitourinary:   Small external hemorrhoid. No internal hemorrhoids palpated.   Neurological: She is alert and oriented to person, place, and time.  Skin: Skin is warm and dry.  Psychiatric: She has a normal mood and affect.    MAU Course  Procedures    Assessment and Plan   1. 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 Hyperemesis gravidarum with metabolic disturbance, antepartum   4. External hemorrhoid, bleeding    RX: anusol suppository FU with Dr.  Clearance Coots Return to MAU as needed 2nd trimester precautions reviewed   Tawnya Crook 11/01/2012, 11:32 PM

## 2012-11-02 DIAGNOSIS — O2390 Unspecified genitourinary tract infection in pregnancy, unspecified trimester: Secondary | ICD-10-CM

## 2012-11-02 MED ORDER — HYDROCORTISONE ACETATE 25 MG RE SUPP: 25.0000 mg | Freq: Two times a day (BID) | RECTAL | Status: DC | PRN

## 2012-11-09 ENCOUNTER — Inpatient Hospital Stay (HOSPITAL_COMMUNITY)
Admission: AD | Admit: 2012-11-09 | Discharge: 2012-11-09 | Disposition: A | Discharge status: Home / Self Care | Payer: Medicaid Other | Source: Ambulatory Visit | Attending: Obstetrics & Gynecology | Admitting: Obstetrics & Gynecology

## 2012-11-09 ENCOUNTER — Encounter (HOSPITAL_COMMUNITY): Payer: Self-pay | Admitting: *Deleted

## 2012-11-09 DIAGNOSIS — R109 Unspecified abdominal pain: Secondary | ICD-10-CM | POA: Insufficient documentation

## 2012-11-09 DIAGNOSIS — O99891 Other specified diseases and conditions complicating pregnancy: Secondary | ICD-10-CM | POA: Insufficient documentation

## 2012-11-09 DIAGNOSIS — O210 Mild hyperemesis gravidarum: Secondary | ICD-10-CM | POA: Insufficient documentation

## 2012-11-09 DIAGNOSIS — N949 Unspecified condition associated with female genital organs and menstrual cycle: Secondary | ICD-10-CM | POA: Insufficient documentation

## 2012-11-09 DIAGNOSIS — IMO0001 Reserved for inherently not codable concepts without codable children: Secondary | ICD-10-CM | POA: Insufficient documentation

## 2012-11-09 DIAGNOSIS — M7918 Myalgia, other site: Secondary | ICD-10-CM

## 2012-11-09 LAB — URINALYSIS, ROUTINE W REFLEX MICROSCOPIC
Bilirubin Urine: NEGATIVE
Glucose, UA: NEGATIVE mg/dL
Hgb urine dipstick: NEGATIVE
Ketones, ur: NEGATIVE mg/dL
pH: 5.5 (ref 5.0–8.0)

## 2012-11-09 NOTE — MAU Provider Note (Signed)
Chief Complaint: Abdominal Pain and Vaginal Pain   First Provider Initiated Contact with Patient 11/09/12 2021     SUBJECTIVE HPI: Misty Walsh is a 22 y.o. G1P0 at [redacted]w[redacted]d by LMP who presents to maternity admissions reporting sharp bilateral inguinal pain when she walks or changes position in bed.  This pain started earlier today.  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  She reports daily nausea with vomiting x1 in last 24 hours. She reports daily fetal movement, denies LOF, vaginal bleeding, vaginal itching/burning, urinary symptoms, h/a, dizziness, or fever/chills.     Past Medical History  Diagnosis Date  . Hypertension   . Ovarian cyst   . Anxiety   . Chlamydia   . Depression med made her navel itching and  made her sleepy so she quit taking them  . GERD (gastroesophageal reflux disease)    Past Surgical History  Procedure Laterality Date  . Breast biopsy      benign   History   Social History  . Marital Status: Single    Spouse Name: N/A    Number of Children: N/A  . Years of Education: N/A   Occupational History  . Not on file.   Social History Main Topics  . Smoking status: Former Smoker -- 0.25 packs/day for 5 years    Types: Cigarettes    Quit date: 07/19/2012  . Smokeless tobacco: Not on file  . Alcohol Use: No     Comment: occasionallly  . Drug Use: No  . Sexual Activity: Yes     Comment: sometimes   Other Topics Concern  . Not on file   Social History Narrative  . No narrative on file   No current facility-administered medications on file prior to encounter.   Current Outpatient Prescriptions on File Prior to Encounter  Medication Sig Dispense Refill  . glycopyrrolate (ROBINUL) 1 MG tablet Take 2 mg by mouth 3 (three) times daily as needed (for spitting).      Marland Kitchen loratadine (CLARITIN) 10 MG tablet Take 1 tablet (10 mg total) by mouth daily.  30 tablet  11  .  omeprazole (PRILOSEC) 20 MG capsule Take 1 capsule (20 mg total) by mouth 2 (two) times daily.  60 capsule  0  . amoxicillin (AMOXIL) 500 MG capsule Take 1 capsule (500 mg total) by mouth 3 (three) times daily.  15 capsule  0  . Meclizine HCl (BONINE) 25 MG CHEW Chew 1 tablet (25 mg total) by mouth 3 (three) times daily.  90 each  5   No Known Allergies  ROS: Pertinent items in HPI  OBJECTIVE Blood pressure 137/77, pulse 86, temperature 98 F (36.7 C), temperature source Oral, resp. rate 18, height 5\' 4"  (1.626 m), weight 108.863 kg (240 lb), last menstrual period 06/24/2012, SpO2 100.00%. GENERAL: Well-developed, well-nourished female in no acute distress.  HEENT: Normocephalic HEART: normal rate RESP: normal effort ABDOMEN: Soft, non-tender EXTREMITIES: Nontender, no edema NEURO: Alert and oriented SPECULUM EXAM: Deferred  FHT 149 by doppler  LAB RESULTS Results for orders placed during the hospital encounter of 11/09/12 (from the past 24 hour(s))  URINALYSIS, ROUTINE W REFLEX MICROSCOPIC     Status: None   Collection Time    11/09/12  9:20 PM      Result Value Range   Color, Urine YELLOW  YELLOW   APPearance CLEAR  CLEAR   Specific Gravity, Urine 1.015  1.005 - 1.030   pH 5.5  5.0 - 8.0   Glucose, UA NEGATIVE  NEGATIVE mg/dL   Hgb urine dipstick NEGATIVE  NEGATIVE   Bilirubin Urine NEGATIVE  NEGATIVE   Ketones, ur NEGATIVE  NEGATIVE mg/dL   Protein, ur NEGATIVE  NEGATIVE mg/dL   Urobilinogen, UA 0.2  0.0 - 1.0 mg/dL   Nitrite NEGATIVE  NEGATIVE   Leukocytes, UA NEGATIVE  NEGATIVE      ASSESSMENT 1. Round ligament pain   2. Musculoskeletal pain     PLAN Discharge home Rest, ice, heat, Tylenol for pain Drink plenty of water F/U as scheduled with Dr Clearance Coots Return to MAU as needed    Medication List    ASK your doctor about these medications       amoxicillin 500 MG capsule  Commonly known as:  AMOXIL  Take 1 capsule (500 mg total) by mouth 3 (three)  times daily.     glycopyrrolate 1 MG tablet  Commonly known as:  ROBINUL  Take 2 mg by mouth 3 (three) times daily as needed (for spitting).     loratadine 10 MG tablet  Commonly known as:  CLARITIN  Take 1 tablet (10 mg total) by mouth daily.     Meclizine HCl 25 MG Chew  Commonly known as:  BONINE  Chew 1 tablet (25 mg total) by mouth 3 (three) times daily.     omeprazole 20 MG capsule  Commonly known as:  PRILOSEC  Take 1 capsule (20 mg total) by mouth 2 (two) times daily.     ranitidine 150 MG tablet  Commonly known as:  ZANTAC  Take 150 mg by mouth 2 (two) times daily.     VITAMIN B6 PO  Take 1 tablet by mouth daily.           Follow-up Information   Follow up with HARPER,CHARLES A, MD.   Specialty:  Obstetrics and Gynecology   Contact information:   [address] [place] [address] [phone]       Sharen Counter Certified Nurse-Midwife 11/09/2012  8:52 PM

## 2012-11-09 NOTE — MAU Note (Addendum)
Pt arrived by EMS and was laughing with Paramedics upon arrival.  Pt states her abd and vaginal pain started at 1630 today and was advised by Dr. Verdell Carmine nurse to come to MAU.  Denies vaginal bleeding but has had an increase in clear vaginal discharge.  Pt was given antibiotic on 10-27-12 for GBS positive in urine.  Pt reports she hasn't started that medication yet due to concerns of possible allergy.  (Pt reports most of her family is allergic to PCN).

## 2012-11-11 ENCOUNTER — Ambulatory Visit (INDEPENDENT_AMBULATORY_CARE_PROVIDER_SITE_OTHER): Payer: BC Managed Care – PPO | Admitting: Obstetrics

## 2012-11-11 ENCOUNTER — Encounter: Payer: Self-pay | Admitting: Obstetrics

## 2012-11-11 ENCOUNTER — Ambulatory Visit (INDEPENDENT_AMBULATORY_CARE_PROVIDER_SITE_OTHER): Payer: BC Managed Care – PPO

## 2012-11-11 VITALS — BP 136/79 | Temp 98.6°F | Wt 242.0 lb

## 2012-11-11 DIAGNOSIS — Z369 Encounter for antenatal screening, unspecified: Secondary | ICD-10-CM

## 2012-11-11 DIAGNOSIS — Z3400 Encounter for supervision of normal first pregnancy, unspecified trimester: Secondary | ICD-10-CM

## 2012-11-11 DIAGNOSIS — Z23 Encounter for immunization: Secondary | ICD-10-CM

## 2012-11-11 DIAGNOSIS — Z3402 Encounter for supervision of normal first pregnancy, second trimester: Secondary | ICD-10-CM

## 2012-11-11 LAB — POCT URINALYSIS DIPSTICK
Ketones, UA: NEGATIVE
Protein, UA: NEGATIVE
Spec Grav, UA: 1.01
Urobilinogen, UA: NEGATIVE
pH, UA: 8

## 2012-11-11 NOTE — Progress Notes (Signed)
Pulse 80, repeat blood pressure 109/75-thigh cuff

## 2012-11-12 ENCOUNTER — Encounter: Payer: Self-pay | Admitting: Obstetrics & Gynecology

## 2012-11-12 LAB — US OB DETAIL + 14 WK

## 2012-11-21 ENCOUNTER — Inpatient Hospital Stay (HOSPITAL_COMMUNITY)
Admission: AD | Admit: 2012-11-21 | Discharge: 2012-12-16 | DRG: 370 | Disposition: A | Discharge status: Home / Self Care | Payer: BC Managed Care – PPO | Source: Ambulatory Visit | Attending: Obstetrics | Admitting: Obstetrics

## 2012-11-21 ENCOUNTER — Encounter (HOSPITAL_COMMUNITY): Payer: Self-pay | Admitting: *Deleted

## 2012-11-21 DIAGNOSIS — N12 Tubulo-interstitial nephritis, not specified as acute or chronic: Secondary | ICD-10-CM | POA: Diagnosis present

## 2012-11-21 DIAGNOSIS — Z22330 Carrier of Group B streptococcus: Secondary | ICD-10-CM

## 2012-11-21 DIAGNOSIS — O09212 Supervision of pregnancy with history of pre-term labor, second trimester: Secondary | ICD-10-CM

## 2012-11-21 DIAGNOSIS — O4290 Premature rupture of membranes, unspecified as to length of time between rupture and onset of labor, unspecified weeks of gestation: Secondary | ICD-10-CM

## 2012-11-21 DIAGNOSIS — O4690 Antepartum hemorrhage, unspecified, unspecified trimester: Secondary | ICD-10-CM

## 2012-11-21 DIAGNOSIS — O411090 Infection of amniotic sac and membranes, unspecified, unspecified trimester, not applicable or unspecified: Secondary | ICD-10-CM | POA: Diagnosis present

## 2012-11-21 DIAGNOSIS — O4700 False labor before 37 completed weeks of gestation, unspecified trimester: Secondary | ICD-10-CM | POA: Diagnosis not present

## 2012-11-21 DIAGNOSIS — O42912 Preterm premature rupture of membranes, unspecified as to length of time between rupture and onset of labor, second trimester: Secondary | ICD-10-CM | POA: Diagnosis not present

## 2012-11-21 DIAGNOSIS — O9989 Other specified diseases and conditions complicating pregnancy, childbirth and the puerperium: Secondary | ICD-10-CM

## 2012-11-21 DIAGNOSIS — R8271 Bacteriuria: Secondary | ICD-10-CM

## 2012-11-21 DIAGNOSIS — O321XX0 Maternal care for breech presentation, not applicable or unspecified: Secondary | ICD-10-CM | POA: Diagnosis present

## 2012-11-21 DIAGNOSIS — O2302 Infections of kidney in pregnancy, second trimester: Secondary | ICD-10-CM | POA: Diagnosis present

## 2012-11-21 DIAGNOSIS — N390 Urinary tract infection, site not specified: Secondary | ICD-10-CM

## 2012-11-21 DIAGNOSIS — O211 Hyperemesis gravidarum with metabolic disturbance: Secondary | ICD-10-CM

## 2012-11-21 DIAGNOSIS — O2390 Unspecified genitourinary tract infection in pregnancy, unspecified trimester: Principal | ICD-10-CM | POA: Diagnosis present

## 2012-11-21 LAB — WET PREP, GENITAL
Trich, Wet Prep: NONE SEEN
Yeast Wet Prep HPF POC: NONE SEEN

## 2012-11-21 LAB — URINALYSIS, ROUTINE W REFLEX MICROSCOPIC
Ketones, ur: NEGATIVE mg/dL
Leukocytes, UA: NEGATIVE
Nitrite: NEGATIVE
Protein, ur: NEGATIVE mg/dL
Specific Gravity, Urine: 1.02 (ref 1.005–1.030)
Urobilinogen, UA: 0.2 mg/dL (ref 0.0–1.0)

## 2012-11-21 LAB — CBC WITH DIFFERENTIAL/PLATELET
Basophils Absolute: 0 10*3/uL (ref 0.0–0.1)
Eosinophils Absolute: 0 10*3/uL (ref 0.0–0.7)
Eosinophils Relative: 0 % (ref 0–5)
HCT: 32.4 % — ABNORMAL LOW (ref 36.0–46.0)
Hemoglobin: 11.1 g/dL — ABNORMAL LOW (ref 12.0–15.0)
Lymphocytes Relative: 5 % — ABNORMAL LOW (ref 12–46)
MCH: 32.1 pg (ref 26.0–34.0)
MCHC: 34.3 g/dL (ref 30.0–36.0)
MCV: 93.6 fL (ref 78.0–100.0)
Monocytes Absolute: 1 10*3/uL (ref 0.1–1.0)
Platelets: 195 10*3/uL (ref 150–400)
RDW: 12.8 % (ref 11.5–15.5)
WBC: 14.6 10*3/uL — ABNORMAL HIGH (ref 4.0–10.5)

## 2012-11-21 MED ORDER — BUTORPHANOL TARTRATE 1 MG/ML IJ SOLN
2.0000 mg | Freq: Four times a day (QID) | INTRAMUSCULAR | Status: DC | PRN
  Administered 2012-11-21: 1 mg via INTRAVENOUS
  Administered 2012-11-22 – 2012-11-23 (×2): 2 mg via INTRAVENOUS
  Administered 2012-12-12: 1 mg via INTRAVENOUS
  Filled 2012-11-21: qty 1
  Filled 2012-11-21 (×2): qty 2
  Filled 2012-11-21 (×2): qty 1

## 2012-11-21 MED ORDER — INFLUENZA VAC SPLIT QUAD 0.5 ML IM SUSP: 0.5000 mL | INTRAMUSCULAR | Status: DC

## 2012-11-21 MED ORDER — IBUPROFEN 600 MG PO TABS
600.0000 mg | ORAL_TABLET | Freq: Four times a day (QID) | ORAL | Status: DC | PRN
  Administered 2012-11-24 – 2012-11-25 (×3): 600 mg via ORAL
  Filled 2012-11-21 (×3): qty 1

## 2012-11-21 MED ORDER — IBUPROFEN 600 MG PO TABS
600.0000 mg | ORAL_TABLET | Freq: Once | ORAL | Status: AC
  Administered 2012-11-21: 600 mg via ORAL
  Filled 2012-11-21: qty 1

## 2012-11-21 MED ORDER — FAMOTIDINE 20 MG PO TABS
20.0000 mg | ORAL_TABLET | Freq: Two times a day (BID) | ORAL | Status: DC
  Administered 2012-11-21 – 2012-12-16 (×49): 20 mg via ORAL
  Filled 2012-11-21 (×49): qty 1

## 2012-11-21 MED ORDER — OXYCODONE-ACETAMINOPHEN 5-325 MG PO TABS
1.0000 | ORAL_TABLET | ORAL | Status: DC | PRN
  Administered 2012-11-22: 1 via ORAL
  Administered 2012-11-22 – 2012-11-28 (×11): 2 via ORAL
  Administered 2012-12-08 (×2): 1 via ORAL
  Administered 2012-12-13: 2 via ORAL
  Filled 2012-11-21 (×2): qty 2
  Filled 2012-11-21: qty 1
  Filled 2012-11-21 (×12): qty 2
  Filled 2012-11-21 (×2): qty 1

## 2012-11-21 MED ORDER — PANTOPRAZOLE SODIUM 40 MG PO TBEC
40.0000 mg | DELAYED_RELEASE_TABLET | Freq: Every day | ORAL | Status: DC
  Administered 2012-11-21 – 2012-12-16 (×24): 40 mg via ORAL
  Filled 2012-11-21 (×31): qty 1

## 2012-11-21 MED ORDER — DEXTROSE 5 % IV SOLN
Freq: Three times a day (TID) | INTRAVENOUS | Status: DC
  Administered 2012-11-21 – 2012-11-22 (×2): via INTRAVENOUS
  Filled 2012-11-21 (×3): qty 4.25

## 2012-11-21 MED ORDER — PROMETHAZINE HCL 25 MG/ML IJ SOLN
12.5000 mg | Freq: Four times a day (QID) | INTRAMUSCULAR | Status: DC | PRN
  Administered 2012-11-21 – 2012-11-27 (×7): 12.5 mg via INTRAVENOUS
  Filled 2012-11-21 (×7): qty 1

## 2012-11-21 MED ORDER — PRENATAL MULTIVITAMIN CH
1.0000 | ORAL_TABLET | Freq: Every day | ORAL | Status: DC
  Administered 2012-11-26 – 2012-12-12 (×17): 1 via ORAL
  Filled 2012-11-21 (×16): qty 1

## 2012-11-21 MED ORDER — CLINDAMYCIN PHOSPHATE 900 MG/50ML IV SOLN: 900.0000 mg | Freq: Three times a day (TID) | INTRAVENOUS | Status: DC

## 2012-11-21 MED ORDER — DEXTROSE IN LACTATED RINGERS 5 % IV SOLN
INTRAVENOUS | Status: DC
  Administered 2012-11-21 – 2012-11-22 (×3): via INTRAVENOUS

## 2012-11-21 NOTE — Progress Notes (Signed)
ANTIBIOTIC CONSULT NOTE - INITIAL  Pharmacy Consult for Gentamicin Indication: Empiric therapy for possible gynecologic or urologic infection    No Known Allergies  Patient Measurements:   Adjusted Body Weight: 71.3kg  Vital Signs: Temp: 101.4 F (38.6 C) (10/04 1708) Temp src: Oral (10/04 1708) BP: 108/60 mmHg (10/04 1532) Pulse Rate: 115 (10/04 1708)  Labs:  Recent Labs  11/21/12 1644  WBC 14.6*  HGB 11.1*  PLT 195   No results found for this basename: GENTTROUGH, GENTPEAK, GENTRANDOM,  in the last 72 hours      Assessment: 22 y.o. female G1P0 at [redacted]w[redacted]d presenting with fever, chills, increased white cell count, body aches, headache, and back pain indicating possible infection Estimated Ke = 0.429 hr-1, Vd = 0.35 L/Kg  Goal of Therapy:  Gentamicin peak 6-8 mg/L and Trough < 1 mg/L  Plan:  Gentamicin 170 mg IV every 8 hrs  Check Scr with next labs if gentamicin continued. Will check gentamicin levels if continued > 72hr or clinically indicated.  Camri Molloy N 11/21/2012,6:12 PM

## 2012-11-21 NOTE — MAU Provider Note (Signed)
History     CSN: [id number]  Arrival date and time: 11/21/12 1508   First Provider Initiated Contact with Patient 11/21/12 1600      Chief Complaint  Patient presents with  . Back Pain  . Generalized Body Aches   HPI Misty Walsh is 22 y.o. G1P0 [redacted]w[redacted]d weeks presenting with pain in her lower back, buttocks and vagina, body aches and thought she had a fever that began suddenly 2 hours ago.  Headache began when she arrived.  Denies vaginal bleeding. Has had vaginal discharge.  Pain is worse when she urinates.  Patient of Dr. Verdell Carmine.  Denies exposure to illness or anyone who has traveled outside of the country.     Past Medical History  Diagnosis Date  . Hypertension   . Ovarian cyst   . Anxiety   . Chlamydia   . Depression med made her navel itching and  made her sleepy so she quit taking them  . GERD (gastroesophageal reflux disease)     Past Surgical History  Procedure Laterality Date  . Breast biopsy      benign    Family History  Problem Relation Age of Onset  . Diabetes Mother   . Heart disease Mother   . Hypertension Mother   . Depression Mother   . Stroke Mother   . Arthritis Mother   . Stroke Father   . Asthma Sister   . Kidney disease Brother     genetic condition  . Diabetes Maternal Aunt   . Hypertension Maternal Aunt   . Asthma Maternal Aunt   . Epilepsy Maternal Aunt   . Hypertension Maternal Grandmother   . Dementia Maternal Grandmother   . Heart disease Maternal Grandmother   . Diabetes Paternal Grandmother     History  Substance Use Topics  . Smoking status: Former Smoker -- 0.25 packs/day for 5 years    Types: Cigarettes    Quit date: 07/19/2012  . Smokeless tobacco: Never Used  . Alcohol Use: No     Comment: occasionallly    Allergies: No Known Allergies  Prescriptions prior to admission  Medication Sig Dispense Refill  . loratadine (CLARITIN) 10 MG tablet Take 1 tablet (10 mg total) by mouth daily.  30 tablet  11  .  omeprazole (PRILOSEC) 20 MG capsule Take 1 capsule (20 mg total) by mouth 2 (two) times daily.  60 capsule  0  . Pyridoxine HCl (VITAMIN B6 PO) Take 1 tablet by mouth daily.      . ranitidine (ZANTAC) 150 MG tablet Take 150 mg by mouth 2 (two) times daily.        Review of Systems  Constitutional: Positive for fever and chills.  HENT: Negative for congestion and sore throat.   Respiratory: Negative.   Cardiovascular: Negative.   Gastrointestinal: Negative for nausea, vomiting and abdominal pain.  Genitourinary: Positive for dysuria, frequency and flank pain. Negative for hematuria.       + pain in her vagina-worse with urination.  Neg for vaginal bleeding. + for abnormal vaginal discharge  Musculoskeletal:       + for lower back pain  Neurological: Positive for headaches.   Physical Exam   Blood pressure 108/60, pulse 117, temperature 100.6 F (38.1 C), temperature source Oral, resp. rate 20, last menstrual period 06/24/2012, SpO2 100.00%.  Physical Exam  Constitutional: She is oriented to person, place, and time. She appears well-developed and well-nourished. No distress.  Doesn't look like she feels well but  not distressed  HENT:  Head: Normocephalic.  Neck: Normal range of motion.  Respiratory: Effort normal. No respiratory distress. She has no wheezes. She has no rales. She exhibits no tenderness.  GI: Soft. She exhibits no distension and no mass. There is no tenderness. There is CVA tenderness (bilateral tenderness). There is no rebound and no guarding.  Genitourinary: There is no rash, tenderness or lesion on the right labia. There is no rash, tenderness or lesion on the left labia. Uterus is enlarged. Uterus is not tender. Cervix exhibits no motion tenderness, no discharge and no friability. There is erythema around the vagina. No bleeding around the vagina. Vaginal discharge: small amount of white discharge without odor,  NEg for pooling of fluid.  Cervical exam by Therese Sarah, RN  Fingertip,, 50-60%  Neurological: She is alert and oriented to person, place, and time.  Skin: Skin is warm and dry. She is not diaphoretic.  Psychiatric: She has a normal mood and affect. Thought content normal.   FETAL HEART RATE BY DOPPLER 160  Results for orders placed during the hospital encounter of 11/21/12 (from the past 24 hour(s))  URINALYSIS, ROUTINE W REFLEX MICROSCOPIC     Status: None   Collection Time    11/21/12  3:32 PM      Result Value Range   Color, Urine YELLOW  YELLOW   APPearance CLEAR  CLEAR   Specific Gravity, Urine 1.020  1.005 - 1.030   pH 7.0  5.0 - 8.0   Glucose, UA NEGATIVE  NEGATIVE mg/dL   Hgb urine dipstick NEGATIVE  NEGATIVE   Bilirubin Urine NEGATIVE  NEGATIVE   Ketones, ur NEGATIVE  NEGATIVE mg/dL   Protein, ur NEGATIVE  NEGATIVE mg/dL   Urobilinogen, UA 0.2  0.0 - 1.0 mg/dL   Nitrite NEGATIVE  NEGATIVE   Leukocytes, UA NEGATIVE  NEGATIVE   MAU Course  Procedures  MDM 16:45  Reported MSE, physical exam findings, vital signs and UA to Dr. Clearance Coots.  CBC/diff and wet prep are pending.  Order given to Admit.   Motrin 600mg  po given per Dr. Verdell Carmine order given in MAU before admission to the floor.   Assessment and Plan  A:  Fever and generalized body aches at [redacted]w[redacted]d gestation  P:  Admit per Dr. Alvia Grove M 11/21/2012, 4:23 PM

## 2012-11-21 NOTE — MAU Note (Signed)
Pt stated she started having pain in her back and "But" a few hours ago. Now c/o generalized body ache and chills.

## 2012-11-22 MED ORDER — ZOLPIDEM TARTRATE 5 MG PO TABS
5.0000 mg | ORAL_TABLET | Freq: Every evening | ORAL | Status: DC | PRN
  Administered 2012-11-29 – 2012-12-09 (×10): 5 mg via ORAL
  Filled 2012-11-22 (×12): qty 1

## 2012-11-22 MED ORDER — DEXTROSE 5 % IV SOLN
1.0000 g | Freq: Two times a day (BID) | INTRAVENOUS | Status: AC
  Administered 2012-11-22 – 2012-11-28 (×14): 1 g via INTRAVENOUS
  Filled 2012-11-22 (×14): qty 10

## 2012-11-22 MED ORDER — SODIUM CHLORIDE 0.9 % IV SOLN
INTRAVENOUS | Status: DC
  Administered 2012-11-23 – 2012-12-02 (×21): via INTRAVENOUS

## 2012-11-22 NOTE — H&P (Signed)
Misty Walsh is a 22 y.o. female presenting for pyelonephritis. Maternal Medical History:  Reason for admission: 22 yo G1 EDC 03-31-13.  Presents with lower back pain, fever and pain with urination.    OB History   Grav Para Term Preterm Abortions TAB SAB Ect Mult Living   1         0     Past Medical History  Diagnosis Date  . Hypertension   . Ovarian cyst   . Anxiety   . Chlamydia   . Depression med made her navel itching and  made her sleepy so she quit taking them  . GERD (gastroesophageal reflux disease)    Past Surgical History  Procedure Laterality Date  . Breast biopsy      benign   Family History: family history includes Arthritis in her mother; Asthma in her maternal aunt and sister; Dementia in her maternal grandmother; Depression in her mother; Diabetes in her maternal aunt, mother, and paternal grandmother; Epilepsy in her maternal aunt; Heart disease in her maternal grandmother and mother; Hypertension in her maternal aunt, maternal grandmother, and mother; Kidney disease in her brother; Stroke in her father and mother. Social History:  reports that she quit smoking about 4 months ago. Her smoking use included Cigarettes. She has a 1.25 pack-year smoking history. She has never used smokeless tobacco. She reports that she does not drink alcohol or use illicit drugs.   Prenatal Transfer Tool  Maternal Diabetes: No Genetic Screening: Normal Maternal Ultrasounds/Referrals: Normal Fetal Ultrasounds or other Referrals:  None Maternal Substance Abuse:  No Significant Maternal Medications:  None Significant Maternal Lab Results:  None Other Comments:  None  Review of Systems  Constitutional: Positive for fever.  Genitourinary: Positive for dysuria, urgency, frequency and flank pain.  Musculoskeletal: Positive for back pain.  All other systems reviewed and are negative.      Blood pressure 133/47, pulse 123, temperature 100.6 F (38.1 C), temperature source  Oral, resp. rate 16, height 5\' 4"  (1.626 m), weight 241 lb 0.4 oz (109.328 kg), last menstrual period 06/24/2012, SpO2 99.00%. Maternal Exam:  Abdomen: Patient reports no abdominal tenderness.   Physical Exam  Constitutional: She is oriented to person, place, and time. She appears well-developed and well-nourished.  HENT:  Head: Normocephalic and atraumatic.  Eyes: Conjunctivae are normal. Pupils are equal, round, and reactive to light.  Neck: Normal range of motion. Neck supple.  Cardiovascular: Normal rate and regular rhythm.   Respiratory: Effort normal and breath sounds normal.  GI: Soft. Bowel sounds are normal.  Neurological: She is alert and oriented to person, place, and time.  Skin: Skin is warm and dry.  Psychiatric: She has a normal mood and affect. Her behavior is normal. Judgment and thought content normal.    Prenatal labs: ABO, Rh: O/POS/-- (07/30 1610) Antibody: NEG (07/30 1610) Rubella: 5.51 (07/30 1610) RPR: NON REAC (07/30 1610)  HBsAg: NEGATIVE (09/09 1201)  HIV: NON REACTIVE (07/30 1610)  GBS:     Assessment/Plan: 21 weeks.  Pyelonephritis.  Antibiotics started.   HARPER,CHARLES A 11/22/2012, 8:00 AM

## 2012-11-23 ENCOUNTER — Inpatient Hospital Stay (HOSPITAL_COMMUNITY): Payer: BC Managed Care – PPO

## 2012-11-23 NOTE — Progress Notes (Signed)
Patient ID: LYRICAL SOWLE, female   DOB: Jun 24, 1990, 22 y.o.   MRN: [id number] Hospital Day: 3  S: Preterm labor symptoms: low back pain  O: Blood pressure 100/62, pulse 104, temperature 98.6 F (37 C), temperature source Oral, resp. rate 19, height 5\' 4"  (1.626 m), weight 241 lb 0.4 oz (109.328 kg), last menstrual period 06/24/2012, SpO2 97.00%.   NGE:XBMWUXLK: 150 bpm Toco: None SVE:   A/P- 22 y.o. admitted with:  Back pain, dysuria and fever.  Present on Admission:  **None**  Pregnancy Complications: pyelonephritis  Preterm labor management: no treatment necessary Dating:  [redacted]w[redacted]d PNL Needed:  Urine culture FWB:  good PTL:  stable

## 2012-11-23 NOTE — Progress Notes (Signed)
Ur chart review completed.  

## 2012-11-23 NOTE — Progress Notes (Signed)
Patient urinary incontinent in bed for two nights already ,( Sat, night and Sunday night.) .  Patient claims , " I don't know why."

## 2012-11-23 NOTE — Consult Note (Signed)
Patient : Misty Walsh; 22 y.o. MRN# [id number] [facility] Attending: Brock Bad, MD Consult Date: 11/21/2012 -  Consult for: pyelonephritis  HPI: Misty Walsh is a 22 y.o. G1P0 at [redacted]w[redacted]d who was admitted with fever and CVAT, leading to presumptive diagnosis of pyelonephritis.  She is not currently experiencing fever, chills or leaking of fluid.  She has continued back and flank pain and does not feel like she is having regular uterine contractions.  She has no active vaginal bleeding but has experienced spotting with wiping.  Allergy: Review of patient's allergies indicates no known allergies.   Current Medications: No current facility-administered medications on file prior to encounter.   Current Outpatient Prescriptions on File Prior to Encounter  Medication Sig Dispense Refill  . loratadine (CLARITIN) 10 MG tablet Take 1 tablet (10 mg total) by mouth daily.  30 tablet  11  . omeprazole (PRILOSEC) 20 MG capsule Take 1 capsule (20 mg total) by mouth 2 (two) times daily.  60 capsule  0  . Pyridoxine HCl (VITAMIN B6 PO) Take 1 tablet by mouth daily.      . ranitidine (ZANTAC) 150 MG tablet Take 150 mg by mouth 2 (two) times daily.         Past Medical History: Past Medical History  Diagnosis Date  . Hypertension   . Ovarian cyst   . Anxiety   . Chlamydia   . Depression med made her navel itching and  made her sleepy so she quit taking them  . GERD (gastroesophageal reflux disease)     Past Surgical History: Past Surgical History  Procedure Laterality Date  . Breast biopsy      benign     Past OB/GYN History: OB History   Grav Para Term Preterm Abortions TAB SAB Ect Mult Living   1         0      Social History: History   Social History  . Marital Status: Single    Spouse Name: N/A    Number of Children: N/A  . Years of Education: N/A   Social History Main Topics  . Smoking status: Former Smoker -- 0.25 packs/day for 5  years    Types: Cigarettes    Quit date: 07/19/2012  . Smokeless tobacco: Never Used  . Alcohol Use: No     Comment: occasionallly  . Drug Use: No  . Sexual Activity: Yes     Comment: sometimes   Other Topics Concern  . None   Social History Narrative  . None    Family History: Family History  Problem Relation Age of Onset  . Diabetes Mother   . Heart disease Mother   . Hypertension Mother   . Depression Mother   . Stroke Mother   . Arthritis Mother   . Stroke Father   . Asthma Sister   . Kidney disease Brother     genetic condition  . Diabetes Maternal Aunt   . Hypertension Maternal Aunt   . Asthma Maternal Aunt   . Epilepsy Maternal Aunt   . Hypertension Maternal Grandmother   . Dementia Maternal Grandmother   . Heart disease Maternal Grandmother   . Diabetes Paternal Grandmother      Review of Systems: As per HPI  Physical Exam: Filed Vitals:   11/23/12 1200  BP: 105/71  Pulse: 79  Temp: 97.3 F (36.3 C)  Resp: 18    Gen:  Well-appearing Neuro:  Grossly intact Back:  +  CVAT right sided Abd:  Gravid, NT  [place]:  See [facility].  CL 2.6-2.95cm  Labs: UA negative.  No urine culture CBC    Component Value Date/Time   WBC 14.6* 11/21/2012 1644   RBC 3.46* 11/21/2012 1644   HGB 11.1* 11/21/2012 1644   HCT 32.4* 11/21/2012 1644   PLT 195 11/21/2012 1644   MCV 93.6 11/21/2012 1644   MCH 32.1 11/21/2012 1644   MCHC 34.3 11/21/2012 1644   RDW 12.8 11/21/2012 1644   LYMPHSABS 0.7 11/21/2012 1644   MONOABS 1.0 11/21/2012 1644   EOSABS 0.0 11/21/2012 1644   BASOSABS 0.0 11/21/2012 1644      Impressions:  Misty Walsh is a 22 y.o. G1P0 at [redacted]w[redacted]d - empiric diagnosis of pyelonephritis  Summary of Recommendations: 1. While no urine culture is available for sensitivity, patient is improving clinically.  Hence, I recommend continuation of the empiric parenteral antibiotics (rocephin/gent) x 48 hours inpatient. 2. Provided no fevers and the patient is tolerating  po diet, I recommend empiric course of keflex 500mg  po qid x 14 days. 3. Then, I recommend uroprophylaxis the remainder of pregnancy with either macrobid 100mg  po qhs or keflex 500mg  po qhs until 6 weeks postpartum as the patient has recurrent UTI in pregnancy with presumptive complicated upper UTI this admission. 4. Of course, if the patient ever has symptoms of UTI again, I would recommend urine culture to assist in sensitivity and assignment of antibiotic therapy.   5. Would assess urine cultures every 1-2 months of pregnancy to screen for asymptomatic bacteruria. 6. Would repeat cervical length again in 1 week as a precaution.  See [facility] (my report). 7. Renal ultrasound has been recently performed.  See separate report by radiology.  Time Spent: I spent in excess of 45 minutes in consultation with this patient to review records, evaluate her case, and provide her with an adequate discussion and education.  More than 50% of this time was spent in direct face-to-face counseling. It was a pleasure seeing your patient in the office today.  Thank you for consultation. Please do not hesitate to contact our service for any further questions.   Page with questions.  Merideth Abbey, MD, MS, FACOG Assistant Professor, Maternal-Fetal Medicine

## 2012-11-24 NOTE — Progress Notes (Signed)
Patient ID: Misty Walsh, female   DOB: [birth year], 22 y.o.   MRN: [id number] Hospital Day: 4  S: Preterm labor symptoms: light bleeding  O: Blood pressure 128/76, pulse 88, temperature 98 F (36.7 C), temperature source Oral, resp. rate 16, height 5\' 4"  (1.626 m), weight 249 lb 4 oz (113.059 kg), last menstrual period 06/24/2012, SpO2 95.00%.   WUJ:WJXBJYNW: 150 bpm Toco: None SVE:   A/P- 22 y.o. admitted with:  Back pain, radiating down legs.  Present on Admission:  **None**  Pregnancy Complications: Pyelonephritis  Preterm labor management: no treatment necessary Dating:  [redacted]w[redacted]d PNL Needed:  none FWB:  good PTL:  stable

## 2012-11-25 DIAGNOSIS — O4700 False labor before 37 completed weeks of gestation, unspecified trimester: Secondary | ICD-10-CM | POA: Diagnosis not present

## 2012-11-25 DIAGNOSIS — O2302 Infections of kidney in pregnancy, second trimester: Secondary | ICD-10-CM | POA: Diagnosis present

## 2012-11-25 LAB — CBC
Hemoglobin: 9.5 g/dL — ABNORMAL LOW (ref 12.0–15.0)
Platelets: 204 10*3/uL (ref 150–400)
RDW: 13 % (ref 11.5–15.5)
WBC: 7.8 10*3/uL (ref 4.0–10.5)

## 2012-11-25 LAB — TYPE AND SCREEN
ABO/RH(D): O POS
Antibody Screen: NEGATIVE

## 2012-11-25 MED ORDER — ACETAMINOPHEN 325 MG PO TABS
650.0000 mg | ORAL_TABLET | ORAL | Status: DC | PRN
  Administered 2012-11-26 – 2012-12-07 (×3): 650 mg via ORAL
  Filled 2012-11-25 (×3): qty 2

## 2012-11-25 MED ORDER — DOCUSATE SODIUM 100 MG PO CAPS
100.0000 mg | ORAL_CAPSULE | Freq: Every day | ORAL | Status: DC
  Administered 2012-11-26 – 2012-12-04 (×5): 100 mg via ORAL
  Filled 2012-11-25 (×14): qty 1

## 2012-11-25 MED ORDER — SODIUM CHLORIDE 0.9 % IJ SOLN: 3.0000 mL | INTRAMUSCULAR | Status: DC | PRN

## 2012-11-25 MED ORDER — CALCIUM CARBONATE ANTACID 500 MG PO CHEW: 2.0000 | CHEWABLE_TABLET | ORAL | Status: DC | PRN

## 2012-11-25 MED ORDER — INDOMETHACIN 50 MG RE SUPP
50.0000 mg | Freq: Once | RECTAL | Status: AC
  Administered 2012-11-25: 50 mg via RECTAL
  Filled 2012-11-25: qty 1

## 2012-11-25 MED ORDER — INDOMETHACIN 25 MG PO CAPS
25.0000 mg | ORAL_CAPSULE | Freq: Four times a day (QID) | ORAL | Status: AC
  Administered 2012-11-26 – 2012-11-28 (×12): 25 mg via ORAL
  Filled 2012-11-25 (×13): qty 1

## 2012-11-25 MED ORDER — SODIUM CHLORIDE 0.9 % IJ SOLN: 3.0000 mL | Freq: Two times a day (BID) | INTRAMUSCULAR | Status: DC

## 2012-11-25 MED ORDER — SODIUM CHLORIDE 0.9 % IV SOLN: 250.0000 mL | INTRAVENOUS | Status: DC | PRN

## 2012-11-25 NOTE — Progress Notes (Signed)
Patient ID: Misty Walsh, female   DOB: 25-Oct-1990, 22 y.o.   MRN: [id number] Hospital Day: 5  S: Still spotting.  Low back pain and dysuria.  O: Blood pressure 106/53, pulse 95, temperature 98 F (36.7 C), temperature source Oral, resp. rate 18, height 5\' 4"  (1.626 m), weight 251 lb 12 oz (114.193 kg), last menstrual period 06/24/2012, SpO2 98.00%.   ION:GEXBMWUX: 150 bpm Toco: None SVE:   A/P- 22 y.o. admitted with:  Back pain and dysuria.  Has and vaginal spotting and ultrasound revealed dynamic activity of cervix.  Stable.  Continue present management.  Present on Admission:  **None**  Pregnancy Complications: Clinical upper UTI.  Preterm labor management: no treatment necessary Dating:  [redacted]w[redacted]d PNL Needed:  none FWB:  good PTL:  Stable

## 2012-11-25 NOTE — Progress Notes (Signed)
Patient ID: Misty Walsh, female   DOB: [birth year], 22 y.o.   MRN: [id number] Subjective: CTSP w/vaginal bleeding and cramping  Objective: Vital signs in last 24 hours: Temp:  [97.4 F (36.3 C)-98.5 F (36.9 C)] 98.5 F (36.9 C) (10/08 2212) Pulse Rate:  [77-96] 77 (10/08 2212) Resp:  [18-20] 18 (10/08 2212) BP: (93-121)/(53-69) 112/53 mmHg (10/08 2212) SpO2:  [98 %-100 %] 100 % (10/08 2126) Weight:  [114.193 kg (251 lb 12 oz)] 114.193 kg (251 lb 12 oz) (10/08 0533)  Intake/Output from previous day: 10/07 0701 - 10/08 0700 In: 5154.7 [P.O.:2070; I.V.:3034.7] Out: 3750 [Urine:3750] Intake/Output this shift: Total I/O In: 1754 [I.V.:1754] Out: 250 [Urine:250]  Dilation: 1 Effacement (%): 70 Cervical Position: Posterior Exam by:: cwicker,rnc  Results for orders placed during the hospital encounter of 11/21/12 (from the past 24 hour(s))  CBC     Status: Abnormal   Collection Time    11/25/12 10:30 PM      Result Value Range   WBC 7.8  4.0 - 10.5 K/uL   RBC 2.99 (*) 3.87 - 5.11 MIL/uL   Hemoglobin 9.5 (*) 12.0 - 15.0 g/dL   HCT 40.9 (*) 81.1 - 91.4 %   MCV 90.3  78.0 - 100.0 fL   MCH 31.8  26.0 - 34.0 pg   MCHC 35.2  30.0 - 36.0 g/dL   RDW 78.2  95.6 - 21.3 %   Platelets 204  150 - 400 K/uL    Studies/Results: US Ob Transvaginal    Scheduled Meds: . cefTRIAXone (ROCEPHIN)  IV  1 g Intravenous Q12H  . docusate sodium  100 mg Oral Daily  . famotidine  20 mg Oral BID  . [START ON 11/26/2012] indomethacin  25 mg Oral Q6H  . pantoprazole  40 mg Oral Daily  . prenatal multivitamin  1 tablet Oral Q1200  . sodium chloride  3 mL Intravenous Q12H   Continuous Infusions: . sodium chloride 125 mL/hr at 11/25/12 2216   PRN Meds:sodium chloride, acetaminophen, butorphanol, calcium carbonate, oxyCODONE-acetaminophen, promethazine, sodium chloride, zolpidem  Assessment/Plan: IUP @ [redacted]w[redacted]d.  Presumptive pyelonephritis.  Threatened PTL.  Transfer to antenatal  unit Tocolysis with indomethacin Monitor closely   LOS: 4 days   JACKSON-MOORE,Renesmee Raine A

## 2012-11-25 NOTE — Progress Notes (Signed)
Ur chart review completed.  

## 2012-11-26 LAB — RPR: RPR Ser Ql: NONREACTIVE

## 2012-11-26 NOTE — Progress Notes (Signed)
Pt c/o shortness of breath. O2 sat 97-99% and lungs sound clear. Pt encouraged not to lie on her back as this can cause her to feel short of breath. Pt also encouraged to take slow, deep breaths.

## 2012-11-26 NOTE — Progress Notes (Signed)
Patient ID: Misty Walsh, female   DOB: Apr 28, 1990, 22 y.o.   MRN: [id number] Hospital Day: 6  S: Preterm labor symptoms: light bleeding and low back pain.  O: Blood pressure 112/53, pulse 77, temperature 98.5 F (36.9 C), temperature source Oral, resp. rate 18, height 5\' 4"  (1.626 m), weight 251 lb 12 oz (114.193 kg), last menstrual period 06/24/2012, SpO2 100.00%.   WUJ:WJXBJYNW: 150 bpm Toco: None GNF:AOZHYQMV: 1 Effacement (%): 70 Cervical Position: Posterior Exam by:: cwicker,rnc  A/P- 22 y.o. admitted with:  Dysuria and spotting.  Stable.  Continue current management.  Present on Admission:  **None**  Pregnancy Complications: Threatened PTL and upper UTI.  Preterm labor management: bedrest advised and Indomecethin Dating:  [redacted]w[redacted]d PNL Needed:  none FWB:  good PTL:  stable

## 2012-11-27 MED ORDER — CEPHALEXIN 500 MG PO CAPS
500.0000 mg | ORAL_CAPSULE | Freq: Three times a day (TID) | ORAL | Status: DC
  Administered 2012-11-29 – 2012-11-30 (×4): 500 mg via ORAL
  Filled 2012-11-27 (×5): qty 1

## 2012-11-27 NOTE — Progress Notes (Signed)
Patient ID: Misty Walsh, female   DOB: [birth year], 22 y.o.   MRN: [id number] Hospital Day: 7  S: Preterm labor symptoms: light bleeding  O: Blood pressure 136/70, pulse 82, temperature 97.9 F (36.6 C), temperature source Oral, resp. rate 20, height 5\' 4"  (1.626 m), weight 251 lb 12 oz (114.193 kg), last menstrual period 06/24/2012, SpO2 99.00%.   JYN:WGNFAOZH: 150 bpm Toco: None YQM:VHQIONGE: 1 Effacement (%): 70 Cervical Position: Posterior Exam by:: cwicker,rnc  A/P- 22 y.o. admitted with:  Upper UTI, presumed pyelonephritis and vaginal bleeding.   Ultrasound revealed dynamic cervix.  Improved on bedrest and antibiotics.  Continue present care.   Present on Admission:  **None**  Pregnancy Complications: preterm labor   Preterm labor management: bedrest advised Dating:  [redacted]w[redacted]d PNL Needed:  none FWB:  good PTL:  stable

## 2012-11-27 NOTE — Progress Notes (Signed)
11/27/12 1400  Clinical Encounter Type  Visited With Patient  Visit Type Spiritual support;Social support  Referral From Nurse  Spiritual Encounters  Spiritual Needs Emotional  Stress Factors  Patient Stress Factors Loss of control   Misty Walsh was grateful for opportunity to share and process her story and feelings about pregnancy and ante stay, as well as for spiritual companionship.  Following for support and encouragement.  [address] [place], [place] [phone]

## 2012-11-27 NOTE — Progress Notes (Signed)
UR completed 

## 2012-11-28 ENCOUNTER — Inpatient Hospital Stay (HOSPITAL_COMMUNITY): Payer: BC Managed Care – PPO

## 2012-11-28 ENCOUNTER — Encounter (HOSPITAL_COMMUNITY): Payer: Self-pay | Admitting: Anesthesiology

## 2012-11-28 LAB — TYPE AND SCREEN
ABO/RH(D): O POS
Antibody Screen: NEGATIVE

## 2012-11-28 MED ORDER — PROMETHAZINE HCL 25 MG PO TABS
25.0000 mg | ORAL_TABLET | Freq: Three times a day (TID) | ORAL | Status: DC | PRN
  Administered 2012-11-28 – 2012-11-30 (×2): 25 mg via ORAL
  Filled 2012-11-28 (×2): qty 1

## 2012-11-28 NOTE — Progress Notes (Signed)
Family member come out to nurses station and stated that pt is bleeding, Nurse to room, pt standing in bathroom, vaginal bleeding occuring, pt instructed to get back in bed so she can be monitored.

## 2012-11-28 NOTE — Progress Notes (Signed)
Patient ID: Misty Walsh, female   DOB: 12-02-1990, 22 y.o.   MRN: [id number] Vital signs normal No complaints Continue present therapy and

## 2012-11-29 MED ORDER — TERBUTALINE SULFATE 1 MG/ML IJ SOLN
0.2500 mg | Freq: Once | INTRAMUSCULAR | Status: AC
  Administered 2012-11-29: 0.25 mg via SUBCUTANEOUS
  Filled 2012-11-29: qty 1

## 2012-11-29 MED ORDER — MAGNESIUM SULFATE 40 G IN LACTATED RINGERS - SIMPLE
2.0000 g/h | INTRAVENOUS | Status: DC
  Administered 2012-11-29: 2 g/h via INTRAVENOUS
  Filled 2012-11-29: qty 500

## 2012-11-29 MED ORDER — MAGNESIUM SULFATE BOLUS VIA INFUSION
4.0000 g | Freq: Once | INTRAVENOUS | Status: AC
  Administered 2012-11-29: 4 g via INTRAVENOUS
  Filled 2012-11-29: qty 500

## 2012-11-29 NOTE — Progress Notes (Signed)
Visited with patient for some 50 minutes.  She is going through the spector of losing a second baby.  Currently she states she is 22 weeks and that she's been told that if she could go to 24, the baby's survival chances increase greatly.  The patient's mothr-in-law (also present was the father; they've been together 11 months, per patient) states the baby is healthy and the patient's cervix is dilating as if she is full term, but is obviously too early.  Patient states her water has broken.  Patient is a very pleasant individual who is leaning heavily on her faith, bolstered by her mother-in-laws belief.  We discussed what it means to ask God for these two weeks if the baby should not survive, how sometimes we don't understand until time passes (and maybe not ever in this life).  Encouraged her to simply do her best one day at a time.  She is hopeful and will be in the hospital for the duration until the baby is delivered.  Encouraged her to ask for the nurse if she should feel anything "different," and that the chaplain staff is also available 24/7.    Husband was quiet, but also opened up the opportunity for spiritual care for him as well.  Noticeably unspoken was any mention of the patient's immediate family, or any visits.  No speculation as to why and the Chaplain chose not to ask.  Rema Jasmine, Chaplain Pager: [phone]

## 2012-11-29 NOTE — Consult Note (Signed)
Asked by Dr Gaynell Face to speak to Misty Walsh to discuss outcome of preterm delivery. Misty Walsh is 22 4/[redacted] wks pregnant, with pyelonephritis, vaginal bleeding, PTL, and SROM. She is on antibiotics,  indomethacin, terbutaline, and magnesium sulfate. No EFW available on last [place].  I spoke to Misty Walsh in her room with her partner present. I discussed survival outcome data for 22-25 week newborns. We have not had any survivors born prior to 23 weeks at [facility]. Outcome for completed 23 weeks is about 1/3 of newborns surviving but with moderate to profound neurodevelopmental impairment. For 24 completed weeks, slightly over half of the babies survive. The parents asked what could be done if the baby delivered at 22 weeks. Based on what we know as survival rate, I clearly said nothing can be done and I recommended comfort care for this age. When asked if anything can be done at 23 weeks, I reviewed the developmental outcome again for this gestation. Knowing this outcome they request for resuscitation IF the baby has good HR and resp effort at birth. With absence of EFW, I discussed limitations of ability to intubate if the baby is SGA.  With information I provided and parent's  presented request, we will plan accordingly at delivery.  I spent 30 minutes with this consult, reviewing the record, speaking to the patient, and entering documentation. More than 50% of the time was spent face to face with patient.  Thank you for this consult.  Lucillie Garfinkel, MD Neonatologist

## 2012-11-29 NOTE — Progress Notes (Signed)
Patient ID: Misty Walsh, female   DOB: February 03, 1991, 22 y.o.   MRN: [id number] Started leaking fluid this morning at 845 and and show positive and supports him increase bleeding this him back and bleeding on her pad and the cervix is 1 cm 100% and fetal part not presented she is now not ruptured membranes and has been having some irritability with but not on the magnesium sulfate 4 g loading and 2 g now for the next 20 40

## 2012-11-29 NOTE — Progress Notes (Signed)
Patient ID: Misty Walsh, female   DOB: 1990/08/18, 22 y.o.   MRN: [id number] The patient had some bleeding last night and irritability and the ultrasound showed breech presentation 22 weeks 4 days her cervix which was 2.6 cm long is now 6 mm lung still close to normal fluid she received 0.5 at term last night x1 and she's not contracting at this time the patient now understands that she will be in the hospital until delivery yesterday she was threatening to sign himself out of the hospital and

## 2012-11-29 NOTE — Progress Notes (Signed)
Hold Magnesium Sulfate for now, will give Terb .25mg  sub-q. If pt starts laboring, initiate magnesium sulfate order at that time.

## 2012-11-30 ENCOUNTER — Encounter (HOSPITAL_COMMUNITY): Payer: BC Managed Care – PPO

## 2012-11-30 DIAGNOSIS — O42912 Preterm premature rupture of membranes, unspecified as to length of time between rupture and onset of labor, second trimester: Secondary | ICD-10-CM | POA: Diagnosis not present

## 2012-11-30 MED ORDER — AMPICILLIN 500 MG PO CAPS
500.0000 mg | ORAL_CAPSULE | Freq: Three times a day (TID) | ORAL | Status: AC
  Administered 2012-12-02 – 2012-12-06 (×15): 500 mg via ORAL
  Filled 2012-11-30 (×18): qty 1

## 2012-11-30 MED ORDER — AZITHROMYCIN 1 G PO PACK
1.0000 g | PACK | Freq: Once | ORAL | Status: AC
  Administered 2012-11-30: 1 g via ORAL
  Filled 2012-11-30: qty 1

## 2012-11-30 MED ORDER — SODIUM CHLORIDE 0.9 % IV SOLN
2.0000 g | Freq: Four times a day (QID) | INTRAVENOUS | Status: AC
  Administered 2012-11-30 – 2012-12-02 (×8): 2 g via INTRAVENOUS
  Filled 2012-11-30 (×8): qty 2000

## 2012-11-30 NOTE — Consult Note (Signed)
MFM Note  [name] is a 22 year old G1 AA female at 22+5 weeks who was admitted on 10/04 with pyelonephritis, cramping and spotting. The upper UTI was treated appropriately and her upper back symptoms improved. The cramping and mild bleeding however continued. On 10/08, an US revealed her cervix to be funneled with ~ 2.6 cms of functional length left. A digital exam was performed at some point: 1/70/posterior. Her symptoms did not improve and 2 nights ago she experienced significant bleeding. Another TA US showed wide U-shaped funneling with very little distal closed cervix (~ 5 mm) and the fetus was in a transverse position. The following morning, she began leaking fluid and was diagnosed with SROM. IV ampicillin and azithromycin were started. Currently, she states that she feels better with less cramping and is leaking a small amount of fluid. Uterus - NT to palpation.  Dr. Mikle Bosworth saw [name] in consultation and reviewed the M&M stats for neonates delivered at varying gestational ages.  [name] and I had a lengthy discussion regarding how aggressive she wanted to be between the gestational ages of 23+ and 25 weeks . She indicated to me that she wanted to "do everything" for her baby at ~ 24 weeks which could possibly included an urgent C/S using a vertical uterine incision. Her risks of surgery were reviewed, specifically about undergoing a classical C/S and the implications for future pregnancies.  Assessment: 1) IUP at 22+5 weeks 2) PPROM; no s/s of IUI 3) Improvement of pyelonephritis  Recommendations: 1) Would obtain US for anatomy, AFV, EFW and position at the end of this week or beginning of next (23+ weeks) 2) Give course of BMZ a couple of days prior to reaching 24 weeks (10/22) 3) Continue PPROM protocol 4) Neuroprotection if delivery is imminent  Please call with concerns or questions.  (Face-to-face consultation with patient: 45 min)

## 2012-11-30 NOTE — Progress Notes (Signed)
Antenatal Nutrition Assessment:  Currently  22 5/[redacted] weeks gestation, with Pyelonephritis, PROM. Height  64" Weight 251 lbs pre-pregnancy weight 261 lbs.Pre-pregnancy  BMI 44.7  IBW 120 lbs  Total weight gain 0 . Weight gain goals 11-20 lbs Pt has Hx of hyperemesis, with a 26 lb net  weight loss in early pregnancy. Still experiences nausea, not every day, and is gaining weight Estimated needs: 21-2300 kcal/day, 78-88 grams protein/day, 2.4 liters fluid/day  Antenatal regular diet tolerated well usually , appetite fair Current diet prescription will provide for increased needs. Noted Hct of < 30 %, may require supplemental iron   nutrition related labs.  Hemoglobin & Hematocrit     Component Value Date/Time   HGB 9.5* 11/25/2012 2230   HCT 27.0* 11/25/2012 2230     Nutrition Dx: Increased nutrient needs r/t pregnancy and fetal growth requirements aeb [redacted] weeks gestation.  No educational needs assessed at this time.  Elisabeth Cara M.Odis Luster LDN Neonatal Nutrition Support Specialist Pager [phone]

## 2012-11-30 NOTE — Progress Notes (Signed)
Ur chart review completed.  

## 2012-11-30 NOTE — Progress Notes (Signed)
Patient ID: Misty Walsh, female   DOB: 13-Feb-1991, 22 y.o.   MRN: [id number] Hospital Day: 68  S: Preterm labor symptoms: none  O: Blood pressure 120/71, pulse 97, temperature 98.2 F (36.8 C), temperature source Oral, resp. rate 16, height 5\' 4"  (1.626 m), weight 114.193 kg (251 lb 12 oz), last menstrual period 06/24/2012, SpO2 100.00%.   GNF:AOZHYQMV: 160 bpm Toco: None HQI:ONGEXBMW: 1 Effacement (%): 80 Cervical Position: Posterior Exam by:: Dr. Gaynell Face  A/P- 22 y.o. admitted with:  Present on Admission:   Principal Problem:   Pyelonephritis complicating pregnancy in second trimester Active Problems:   Threatened preterm labor   Preterm premature rupture of membranes in second trimester   Pregnancy Complications: see above Preterm labor management: D/C MgSO4 for now; antibiotics to prolong latency; d/c foley Neonatology consult noted; MFM consult Dating:  [redacted]w[redacted]d

## 2012-12-01 NOTE — Progress Notes (Signed)
Patient ID: Misty Walsh, female   DOB: October 09, 1990, 22 y.o.   MRN: [id number] Hospital Day: 94  S: Preterm labor symptoms: fluid leakage, light bleeding and low back pain  O: Blood pressure 113/63, pulse 85, temperature 98.3 F (36.8 C), temperature source Oral, resp. rate 18, height 5\' 4"  (1.626 m), weight 251 lb 12 oz (114.193 kg), last menstrual period 06/24/2012, SpO2 100.00%.   WUJ:WJXBJYNW: 150-160 bpm Toco: Irregular UC's GNF:AOZHYQMV: 1 Effacement (%): 80 Cervical Position: Posterior Exam by:: Dr. Gaynell Face  A/P- 22 y.o. admitted with:  Back pain and dysuria, presumptive pyelonephritis, but no urine culture.  Also probable ascending chorionitis with vaginal spotting that progressed to cervical changes and PPROM,  Stable.  Continue bedrest and antibiotic prophylaxis for PPROM.  Present on Admission:  . Pyelonephritis complicating pregnancy in second trimester  Pregnancy Complications: preterm labor  and clinical pyelonephritis.  PPROM. Preterm labor management: Bedrest.  Antibiotic prophylaxis for PPROM. Dating:  [redacted]w[redacted]d PNL Needed:  none FWB:  good PTL:  stable

## 2012-12-02 LAB — TYPE AND SCREEN
ABO/RH(D): O POS
Antibody Screen: NEGATIVE

## 2012-12-02 NOTE — Progress Notes (Signed)
Pt request if her IV can be d/c'd.  Per MD IV may be d/cd

## 2012-12-02 NOTE — Progress Notes (Signed)
Patient ID: Misty Walsh, female   DOB: [birth year], 22 y.o.   MRN: [id number] Hospital Day: 20  S: Preterm labor symptoms: fluid leakage, light bleeding and low back pain  O: Blood pressure 131/77, pulse 72, temperature 98.2 F (36.8 C), temperature source Oral, resp. rate 20, height 5\' 4"  (1.626 m), weight 251 lb 12 oz (114.193 kg), last menstrual period 06/24/2012, SpO2 100.00%.   WUJ:WJXBJYNW: 150 bpm Toco: None GNF:AOZHYQMV: 1 Effacement (%): 80 Cervical Position: Posterior Exam by:: Dr. Gaynell Face  A/P- 22 y.o. admitted with:  Pyelonephritis.  Developed ascending chorionitis that progressed to PPROM.  Stable.  Continue current care of bedrest and antibiotic prophylaxis..   Present on Admission:  . Pyelonephritis complicating pregnancy in second trimester  Pregnancy Complications: Clinical pyelonephritis and PPROM after ascending infection.  Preterm labor management: bedrest advised Dating:  [redacted]w[redacted]d PNL Needed:  none FWB:  good PTL:  stable

## 2012-12-02 NOTE — Progress Notes (Signed)
12/02/12 1300  Clinical Encounter Type  Visited With Patient  Visit Type Follow-up;Spiritual support;Social support  Referral From Nurse  Spiritual Encounters  Spiritual Needs Emotional  Stress Factors  Patient Stress Factors Loss of control;Financial concerns;Family relationships   Misty Walsh was stressed and slightly tearful when I visited this afternoon.  She is worried about monthly bills, not working due to hospitalization, and an unexpected delay in pay, as well as family concerns.  Reflective empathic listening helped her relax and feel supported, while a visit from her godmother and godsister lifted her spirits.  Spiritual Care will continue to follow for support and encouragement.  [address] [place], [place] [phone]

## 2012-12-03 NOTE — Progress Notes (Addendum)
Patient ID: Misty Walsh, female   DOB: 05-04-1990, 22 y.o.   MRN: [id number] Hospital Day: 4  S: Preterm labor symptoms: low back pain and pelvic pressure  O: Blood pressure 128/71, pulse 91, temperature 98.1 F (36.7 C), temperature source Oral, resp. rate 18, height 5\' 4"  (1.626 m), weight 251 lb 12 oz (114.193 kg), last menstrual period 06/24/2012, SpO2 100.00%.   JXB:JYNWGNFA: 150-160 bpm Toco: Occasional UC's OZH:YQMVHQIO: 1 Effacement (%): 80 Cervical Position: Posterior Exam by:: Dr. Gaynell Face  A/P- 22 y.o. admitted with: Preterm cervical changes and concurrent pyelonephritis.  Now PPROM.  Stable.  Continue antibiotic.  Present on Admission:  . Pyelonephritis complicating pregnancy in second trimester  Pregnancy Complications: preterm labor   Preterm labor management: bedrest advised Dating:  [redacted]w[redacted]d PNL Needed:  none FWB:  good PTL:  stable

## 2012-12-04 ENCOUNTER — Encounter: Payer: Self-pay | Admitting: Obstetrics

## 2012-12-04 ENCOUNTER — Inpatient Hospital Stay (HOSPITAL_COMMUNITY): Payer: BC Managed Care – PPO

## 2012-12-04 ENCOUNTER — Encounter (HOSPITAL_COMMUNITY): Payer: Self-pay | Admitting: Obstetrics

## 2012-12-04 NOTE — Progress Notes (Signed)
Patient ID: Misty Walsh, female   DOB: 10-02-1990, 22 y.o.   MRN: [id number] Hospital Day: 39  S: Patient is comfortable. Continuing to experience clear leaking of fluid. Denies odor.Denies pain, fever, contractions. Reports + fetal movement. Patient in bed with her boyfriend.   O: Blood pressure 120/78, pulse 89, temperature 98.2 F (36.8 C), temperature source Oral, resp. rate 20, height 5\' 4"  (1.626 m), weight 251 lb 12 oz (114.193 kg), last menstrual period 06/24/2012, SpO2 100.00%.   HQI:ONGEXBMW: 150-160 bpm Toco: Occasional UC's UXL:KGMWNUUV: 1 Effacement (%): 80 Cervical Position: Posterior Exam by:: Dr. Gaynell Face  Abdomen non-tender  A/P- 22 y.o. admitted with: Preterm cervical changes and concurrent pyelonephritis.  Now PPROM.  Stable.  Continue antibiotic. Ordered MFM consult for today now PPROM is confirmed. Patient to be on strict pelvic rest, asked RN to relay this to patient.  Plan Betamethasone next week @ 24 weeks   Present on Admission:  . Pyelonephritis complicating pregnancy in second trimester  Pregnancy Complications: preterm labor   Preterm labor management: bedrest advised Dating:  [redacted]w[redacted]d PNL Needed:  none FWB:  good PTL:  stable

## 2012-12-04 NOTE — Progress Notes (Signed)
MATERNAL FETAL MEDICINE CONSULT  Patient Name: Misty Walsh Medical Record Number:  [id number] Date of Birth: 1990/06/08 Requesting Physician Name:  Brock Bad, MD Date of Service: 12/04/2012  Chief Complaint PPROM  History of Present Illness Misty Walsh was seen today for prenatal diagnosis secondary to PPROM at the request of Dr. Clearance Coots.  The patient is a 22 y.o. G1P0,at [redacted]w[redacted]d with an EDD of 03/31/2013, by Last Menstrual Period dating method who has PPROM And prior PTL with dilation to 1cm that has since been stable.  [name] denies any VB/CTX, reports good fetal movement and loss of fluid.  She denies any fevers, chills, nausea vomiting.  She reports some vaginal discomfort periodically, but no change in her symptoms today.   Review of Systems Pertinent items are noted in HPI.  Patient History OB History  Gravida Para Term Preterm AB SAB TAB Ectopic Multiple Living  1         0    # Outcome Date GA Lbr Len/2nd Weight Sex Delivery Anes PTL Lv  1 CUR               Past Medical History  Diagnosis Date  . Hypertension   . Ovarian cyst   . Anxiety   . Chlamydia   . Depression med made her navel itching and  made her sleepy so she quit taking them  . GERD (gastroesophageal reflux disease)     Past Surgical History  Procedure Laterality Date  . Breast biopsy      benign    History   Social History  . Marital Status: Single    Spouse Name: N/A    Number of Children: N/A  . Years of Education: N/A   Social History Main Topics  . Smoking status: Former Smoker -- 0.25 packs/day for 5 years    Types: Cigarettes    Quit date: 07/19/2012  . Smokeless tobacco: Never Used  . Alcohol Use: No     Comment: occasionallly  . Drug Use: No  . Sexual Activity: Yes     Comment: sometimes   Other Topics Concern  . None   Social History Narrative  . None    Family History  Problem Relation Age of Onset  . Diabetes Mother   . Heart disease Mother   .  Hypertension Mother   . Depression Mother   . Stroke Mother   . Arthritis Mother   . Stroke Father   . Asthma Sister   . Kidney disease Brother     genetic condition  . Diabetes Maternal Aunt   . Hypertension Maternal Aunt   . Asthma Maternal Aunt   . Epilepsy Maternal Aunt   . Hypertension Maternal Grandmother   . Dementia Maternal Grandmother   . Heart disease Maternal Grandmother   . Diabetes Paternal Grandmother    In addition, the patient has no family history of mental retardation, birth defects, or genetic diseases.  Physical Examination Filed Vitals:   12/04/12 1606  BP: 138/85  Pulse: 99  Temp: 97.9 F (36.6 C)  Resp: 18   General appearance - alert, well appearing, and in no distress Abdomen- soft, non tender, gravid.  No fundal tenderness  Assessment and Recommendations 22 yo G1 @ [redacted]w[redacted]d with PTL and PPROM, suspected pyelonephritis s/p treatment 1. PTL-stable.  No further labor progress or regular contractions in many days. 2. PPROM- Complete a 7 day course of latency antibiotics with Ampicillin/Azithromycin.  Deliver for chorioamnionitis, labor,  abruption, or [redacted] weeks gestation.  Patient desires everything to be done for the pregnancy including a classical cesarean if necessary after 24 weeks.  Advise giving Betamethasone at 23 5/7-23 6/7 in anticipation of possible delivery after that point.   3. Presumed pyelonephritis s/p treatment- continue for 14 day course of po antibiotics and then macrobid suppression for remainder of pregnancy.   15 minutes was spent with >50% devoted to face to face counseling of the patient on the above.   Molly Maduro, MD MFM Fellow  Case discussed with Dr. Derinda Sis

## 2012-12-05 MED ORDER — NITROFURANTOIN MONOHYD MACRO 100 MG PO CAPS
100.0000 mg | ORAL_CAPSULE | Freq: Every day | ORAL | Status: DC
  Administered 2012-12-05 – 2012-12-09 (×5): 100 mg via ORAL
  Filled 2012-12-05 (×6): qty 1

## 2012-12-05 NOTE — Progress Notes (Signed)
Patient ID: Misty Walsh, female   DOB: [birth year], 22 y.o.   MRN: [id number] Hospital Day: 48  S: No new complaints.   O: Blood pressure 111/60, pulse 90, temperature 97.8 F (36.6 C), temperature source Oral, resp. rate 20, height 5\' 4"  (1.626 m), weight 114.193 kg (251 lb 12 oz), last menstrual period 06/24/2012, SpO2 100.00%.   WUJ:WJXBJYNW: 150-160 bpm Toco: Occasional UC's GNF:AOZHYQMV: 1 Effacement (%): 80 Cervical Position: Posterior Exam by:: Dr. Gaynell Face  Abdomen non-tender  A/P- Principal Problem:   Pyelonephritis complicating pregnancy in second trimester Active Problems:   Threatened preterm labor   Preterm premature rupture of membranes in second trimester  Pregnancy Complications: preterm labor   Preterm labor management: bedrest advised Dating:  110w3d  PNL Needed:  none FWB:  good PTL:  Stable Start Macrobid suppression

## 2012-12-06 NOTE — Progress Notes (Addendum)
Patient ID: Misty Walsh, female   DOB: 1990/04/12, 22 y.o.   MRN: [id number] Hospital Day: 62  S: No new complaints.   O: Blood pressure 126/70, pulse 88, temperature 98.2 F (36.8 C), temperature source Oral, resp. rate 18, height 5\' 4"  (1.626 m), weight 114.193 kg (251 lb 12 oz), last menstrual period 06/24/2012, SpO2 100.00%.   OZH:YQMVHQIO: 150-160 bpm Toco: Occasional UC's NGE:XBMWUXLK: 1 Effacement (%): 80 Cervical Position: Posterior Exam by:: Dr. Gaynell Face  Abdomen non-tender  A/P- Principal Problem:   Pyelonephritis complicating pregnancy in second trimester Active Problems:   Threatened preterm labor   Preterm premature rupture of membranes in second trimester  Pregnancy Complications: preterm labor   Preterm labor management: bedrest advised Dating:  [redacted]w[redacted]d  PNL Needed:  none FWB:  good PTL:  Stable

## 2012-12-07 MED ORDER — BETAMETHASONE SOD PHOS & ACET 6 (3-3) MG/ML IJ SUSP
12.0000 mg | INTRAMUSCULAR | Status: AC
  Administered 2012-12-07 – 2012-12-08 (×2): 12 mg via INTRAMUSCULAR
  Filled 2012-12-07 (×2): qty 2

## 2012-12-07 MED ORDER — SODIUM CHLORIDE 0.9 % IJ SOLN
3.0000 mL | Freq: Two times a day (BID) | INTRAMUSCULAR | Status: DC
  Administered 2012-12-07 – 2012-12-12 (×11): 3 mL via INTRAVENOUS

## 2012-12-07 NOTE — Progress Notes (Signed)
Ur chart review completed.  

## 2012-12-07 NOTE — Progress Notes (Signed)
Patient ID: Misty Walsh, female   DOB: May 13, 1990, 22 y.o.   MRN: [id number] Hospital Day: 83  S: No new complaints.   O: Blood pressure 126/50, pulse 103, temperature 98.1 F (36.7 C), temperature source Oral, resp. rate 24, height 5\' 4"  (1.626 m), weight 251 lb 12 oz (114.193 kg), last menstrual period 06/24/2012, SpO2 100.00%.   AOZ:HYQMVHQI: 150-160 bpm Toco: Occasional UC's ONG:EXBMWUXL: 1 Effacement (%): 80 Cervical Position: Posterior Exam by:: Dr. Gaynell Face Deferred today  Abdomen non-tender  A/P- Principal Problem:   Pyelonephritis complicating pregnancy in second trimester Active Problems:   Threatened preterm labor   Preterm premature rupture of membranes in second trimester  Pregnancy Complications: preterm labor   Preterm labor management: bedrest advised Dating:  [redacted]w[redacted]d  PNL Needed:  none FWB:  good PTL:  Stable  Misty Walsh CNM

## 2012-12-08 LAB — TYPE AND SCREEN: Antibody Screen: NEGATIVE

## 2012-12-08 NOTE — Progress Notes (Signed)
Patient ID: Misty Walsh, female   DOB: [birth year], 22 y.o.   MRN: [id number] Hospital Day: 38  S: Preterm labor symptoms: fluid leakage, light bleeding, low back pain and pelvic pressure  O: Blood pressure 119/62, pulse 103, temperature 98 F (36.7 C), temperature source Oral, resp. rate 18, height 5\' 4"  (1.626 m), weight 251 lb 12 oz (114.193 kg), last menstrual period 06/24/2012, SpO2 100.00%.   FAO:ZHYQMVHQ: 150 bpm Toco: None ION:GEXBMWUX: 1 Effacement (%): 80 Cervical Position: Posterior Exam by:: Dr. Gaynell Face  A/P- 22 y.o. admitted with:  Pyelonephritis, complicated by PTL and PPROM.  Stable.  Continue current management.  Present on Admission:  . Pyelonephritis complicating pregnancy in second trimester  Pregnancy Complications: preterm labor  and pyeleonephritis, and PPROM  Preterm labor management: bedrest advised Dating:  [redacted]w[redacted]d PNL Needed:  none FWB:  good PTL:  stable

## 2012-12-09 ENCOUNTER — Other Ambulatory Visit: Payer: BC Managed Care – PPO

## 2012-12-09 ENCOUNTER — Encounter: Payer: BC Managed Care – PPO | Admitting: Obstetrics

## 2012-12-09 MED ORDER — DIPHENHYDRAMINE HCL 25 MG PO CAPS
25.0000 mg | ORAL_CAPSULE | Freq: Four times a day (QID) | ORAL | Status: DC | PRN
  Administered 2012-12-10: 25 mg via ORAL
  Filled 2012-12-09 (×2): qty 1

## 2012-12-09 MED ORDER — FLUCONAZOLE 150 MG PO TABS
150.0000 mg | ORAL_TABLET | Freq: Once | ORAL | Status: AC
  Administered 2012-12-09: 150 mg via ORAL
  Filled 2012-12-09: qty 1

## 2012-12-09 NOTE — Progress Notes (Signed)
Patient ID: Misty Walsh, female   DOB: 1990-05-14, 22 y.o.   MRN: [id number] Hospital Day: 13  S: Preterm labor symptoms: light bleeding and low back pain  O: Blood pressure 136/75, pulse 123, temperature 98.2 F (36.8 C), temperature source Oral, resp. rate 18, height 5\' 4"  (1.626 m), weight 251 lb 12 oz (114.193 kg), last menstrual period 06/24/2012, SpO2 100.00%.   OZD:GUYQIHKV: 150 bpm Toco: Occasional mild UC's QQV:ZDGLOVFI: 1 Effacement (%): 80 Cervical Position: Posterior Exam by:: Dr. Gaynell Face  A/P- 22 y.o. admitted with:  Clinical pyelonephritis.  Progressed to vaginal bleeding and cervical changes.  Now stable.  Continue bedrest.  Present on Admission:  . Pyelonephritis complicating pregnancy in second trimester  Pregnancy Complications: preterm labor  and pyelonephritis  Preterm labor management: bedrest advised Dating:  [redacted]w[redacted]d PNL Needed:  none FWB:  good PTL:  stable

## 2012-12-10 ENCOUNTER — Inpatient Hospital Stay (HOSPITAL_COMMUNITY): Payer: BC Managed Care – PPO

## 2012-12-10 NOTE — Progress Notes (Signed)
UR completed 

## 2012-12-10 NOTE — Progress Notes (Signed)
Patient ID: Misty Walsh, female   DOB: Mar 17, 1990, 22 y.o.   MRN: [id number] Hospital Day: 62  S: Preterm labor symptoms: light bleeding and low back pain  O: Blood pressure 143/81, pulse 105, temperature 98.2 F (36.8 C), temperature source Oral, resp. rate 20, height 5\' 4"  (1.626 m), weight 239 lb 6.4 oz (108.591 kg), last menstrual period 06/24/2012, SpO2 100.00%.   NFA:OZHYQMVH: 150 bpm Toco: None QIO:NGEXBMWU: 1 Effacement (%): 80 Cervical Position: Posterior Exam by:: Dr. Gaynell Face  A/P- 22 y.o. admitted with:  Pyelonephritis / PTL.  Stable.  Continue bedrest.  Present on Admission:  . Pyelonephritis complicating pregnancy in second trimester   Pregnancy Complications: preterm labor  and pyelonephritis  Preterm labor management: bedrest advised Dating:  [redacted]w[redacted]d PNL Needed:  none FWB:  good PTL:  stable

## 2012-12-10 NOTE — Progress Notes (Signed)
Patient called out to nurse's station asking for her nurse.  RN goes to bedside and patient informs RN that she had some blood on her peripad and had blood on her tissue paper when she wiped.  RN notes scant amount of pink watery fluid on peripad.  RN puts a clean peripad on patient.  RN will watch closely

## 2012-12-11 NOTE — Progress Notes (Signed)
Patient ID: Misty Walsh, female   DOB: 30-Jun-1990, 22 y.o.   MRN: [id number] Hospital Day: 60  S: Preterm labor symptoms: light bleeding and low back pain. Continues to have clear LOF. Reports + fetal movement. Denies fever, chills or abdominal tenderness.  O: Blood pressure 100/71, pulse 93, temperature 98.2 F (36.8 C), temperature source Oral, resp. rate 18, height 5\' 4"  (1.626 m), weight 239 lb 6.4 oz (108.591 kg), last menstrual period 06/24/2012, SpO2 100.00%.   NWG:NFAOZHYQ: 150 bpm Toco: None MVH:QIONGEXB: 1 Effacement (%): 80 Cervical Position: Posterior Exam by:: Dr. Gaynell Face Abdomen soft NT  A/P- 22 y.o. admitted with:  Pyelonephritis / PTL.  Stable.  Continue bedrest. PPROM, afebrile, clear fluid MVP: 5cm on 12/10/2012 Dynamic Cervix, funneling S/P Betamethasone x2  Present on Admission:  . Pyelonephritis complicating pregnancy in second trimester   Pregnancy Complications: preterm labor  and pyelonephritis  Preterm labor management: bedrest advised Dating:  [redacted]w[redacted]d PNL Needed:  none FWB:  good PTL:  stable

## 2012-12-11 NOTE — Progress Notes (Signed)
Pt off the monitor after reassurring FHR  

## 2012-12-11 NOTE — Progress Notes (Signed)
Misty Walsh was in good spirits today and is grateful that she has made it to 24 weeks.  She is appreciative of on-going support and we will continue to follow-up with her, but please also page as needs arise.    [employer] Pager, [phone] 10:27 AM   12/11/12 1000  Clinical Encounter Type  Visited With Patient  Visit Type Follow-up;Spiritual support  Referral From Chaplain

## 2012-12-12 ENCOUNTER — Encounter (HOSPITAL_COMMUNITY): Payer: Self-pay | Admitting: Anesthesiology

## 2012-12-12 ENCOUNTER — Inpatient Hospital Stay (HOSPITAL_COMMUNITY): Payer: BC Managed Care – PPO

## 2012-12-12 MED ORDER — LACTATED RINGERS IV SOLN
INTRAVENOUS | Status: DC
  Administered 2012-12-12: 22:00:00 via INTRAVENOUS

## 2012-12-12 MED ORDER — MAGNESIUM SULFATE BOLUS VIA INFUSION
4.0000 g | Freq: Once | INTRAVENOUS | Status: AC
  Administered 2012-12-12: 4 g via INTRAVENOUS
  Filled 2012-12-12: qty 500

## 2012-12-12 MED ORDER — MAGNESIUM SULFATE 40 G IN LACTATED RINGERS - SIMPLE
2.0000 g/h | INTRAVENOUS | Status: DC
  Filled 2012-12-12: qty 500

## 2012-12-12 NOTE — Progress Notes (Signed)
Patient ID: Misty Walsh, female   DOB: January 16, 1991, 22 y.o.   MRN: [id number] No change in her condition doing well

## 2012-12-12 NOTE — Progress Notes (Signed)
Chaplain visited the patient as a follow up to a visit some 12 days ago, when the patient was told that it was desirable if she could carry her baby for two more weeks.  We took some time to rejoice in the event now, close to reaching it, that she was so apprehensive of two weeks ago.  Short visit for encouragement and to focus on the progress, and the presence of God in her life and the life of her baby.  Rema Jasmine, Chaplain Pager: [phone]

## 2012-12-13 ENCOUNTER — Encounter (HOSPITAL_COMMUNITY): Payer: BC Managed Care – PPO | Admitting: Anesthesiology

## 2012-12-13 ENCOUNTER — Inpatient Hospital Stay (HOSPITAL_COMMUNITY): Payer: BC Managed Care – PPO | Admitting: Anesthesiology

## 2012-12-13 ENCOUNTER — Encounter (HOSPITAL_COMMUNITY)
Admission: AD | Disposition: A | Discharge status: Home / Self Care | Payer: Self-pay | Source: Ambulatory Visit | Attending: Obstetrics

## 2012-12-13 ENCOUNTER — Encounter (HOSPITAL_COMMUNITY): Payer: Self-pay | Admitting: Anesthesiology

## 2012-12-13 DIAGNOSIS — O4290 Premature rupture of membranes, unspecified as to length of time between rupture and onset of labor, unspecified weeks of gestation: Secondary | ICD-10-CM

## 2012-12-13 LAB — CBC
Hemoglobin: 10.1 g/dL — ABNORMAL LOW (ref 12.0–15.0)
MCH: 31.7 pg (ref 26.0–34.0)
MCHC: 33.6 g/dL (ref 30.0–36.0)
MCV: 94.4 fL (ref 78.0–100.0)
RBC: 3.19 MIL/uL — ABNORMAL LOW (ref 3.87–5.11)
RDW: 12.6 % (ref 11.5–15.5)

## 2012-12-13 SURGERY — Surgical Case
Anesthesia: Spinal | Wound class: Clean Contaminated

## 2012-12-13 MED ORDER — EPHEDRINE SULFATE 50 MG/ML IJ SOLN
INTRAMUSCULAR | Status: DC | PRN
  Administered 2012-12-13: 10 mg via INTRAVENOUS
  Administered 2012-12-13: 15 mg via INTRAVENOUS

## 2012-12-13 MED ORDER — ONDANSETRON HCL 4 MG PO TABS: 4.0000 mg | ORAL_TABLET | ORAL | Status: DC | PRN

## 2012-12-13 MED ORDER — SIMETHICONE 80 MG PO CHEW
80.0000 mg | CHEWABLE_TABLET | ORAL | Status: DC
  Administered 2012-12-13 – 2012-12-16 (×3): 80 mg via ORAL
  Filled 2012-12-13 (×3): qty 1

## 2012-12-13 MED ORDER — FENTANYL CITRATE 0.05 MG/ML IJ SOLN
INTRAMUSCULAR | Status: DC | PRN
  Administered 2012-12-13: 87.5 ug via INTRAVENOUS

## 2012-12-13 MED ORDER — HYDROMORPHONE HCL PF 1 MG/ML IJ SOLN
INTRAMUSCULAR | Status: AC
  Administered 2012-12-13: 0.5 mg via INTRAVENOUS
  Filled 2012-12-13: qty 1

## 2012-12-13 MED ORDER — PHENYLEPHRINE 40 MCG/ML (10ML) SYRINGE FOR IV PUSH (FOR BLOOD PRESSURE SUPPORT)
PREFILLED_SYRINGE | INTRAVENOUS | Status: AC
  Filled 2012-12-13: qty 5

## 2012-12-13 MED ORDER — LACTATED RINGERS IV SOLN
INTRAVENOUS | Status: DC | PRN
  Administered 2012-12-13 (×2): via INTRAVENOUS

## 2012-12-13 MED ORDER — DIBUCAINE 1 % RE OINT: 1.0000 "application " | TOPICAL_OINTMENT | RECTAL | Status: DC | PRN

## 2012-12-13 MED ORDER — PROMETHAZINE HCL 25 MG/ML IJ SOLN: 6.2500 mg | INTRAMUSCULAR | Status: DC | PRN

## 2012-12-13 MED ORDER — SIMETHICONE 80 MG PO CHEW
80.0000 mg | CHEWABLE_TABLET | Freq: Three times a day (TID) | ORAL | Status: DC
  Administered 2012-12-13 – 2012-12-16 (×7): 80 mg via ORAL
  Filled 2012-12-13 (×9): qty 1

## 2012-12-13 MED ORDER — LACTATED RINGERS IV SOLN
INTRAVENOUS | Status: DC | PRN
  Administered 2012-12-13: 01:00:00 via INTRAVENOUS

## 2012-12-13 MED ORDER — DIPHENHYDRAMINE HCL 25 MG PO CAPS: 25.0000 mg | ORAL_CAPSULE | Freq: Four times a day (QID) | ORAL | Status: DC | PRN

## 2012-12-13 MED ORDER — LANOLIN HYDROUS EX OINT: 1.0000 "application " | TOPICAL_OINTMENT | CUTANEOUS | Status: DC | PRN

## 2012-12-13 MED ORDER — METOCLOPRAMIDE HCL 5 MG/ML IJ SOLN: 10.0000 mg | Freq: Three times a day (TID) | INTRAMUSCULAR | Status: DC | PRN

## 2012-12-13 MED ORDER — NALBUPHINE HCL 10 MG/ML IJ SOLN
5.0000 mg | INTRAMUSCULAR | Status: DC | PRN
  Filled 2012-12-13: qty 1

## 2012-12-13 MED ORDER — OXYTOCIN 40 UNITS IN LACTATED RINGERS INFUSION - SIMPLE MED: 62.5000 mL/h | INTRAVENOUS | Status: AC

## 2012-12-13 MED ORDER — DIPHENHYDRAMINE HCL 25 MG PO CAPS
25.0000 mg | ORAL_CAPSULE | ORAL | Status: DC | PRN
  Administered 2012-12-13: 25 mg via ORAL
  Filled 2012-12-13: qty 1

## 2012-12-13 MED ORDER — KETOROLAC TROMETHAMINE 60 MG/2ML IM SOLN
INTRAMUSCULAR | Status: AC
  Administered 2012-12-13: 60 mg via INTRAMUSCULAR
  Filled 2012-12-13: qty 2

## 2012-12-13 MED ORDER — ZOLPIDEM TARTRATE 5 MG PO TABS: 5.0000 mg | ORAL_TABLET | Freq: Every evening | ORAL | Status: DC | PRN

## 2012-12-13 MED ORDER — MORPHINE SULFATE 0.5 MG/ML IJ SOLN
INTRAMUSCULAR | Status: AC
  Filled 2012-12-13: qty 10

## 2012-12-13 MED ORDER — OXYTOCIN 10 UNIT/ML IJ SOLN
INTRAMUSCULAR | Status: AC
  Filled 2012-12-13: qty 4

## 2012-12-13 MED ORDER — ONDANSETRON HCL 4 MG/2ML IJ SOLN: 4.0000 mg | INTRAMUSCULAR | Status: DC | PRN

## 2012-12-13 MED ORDER — ONDANSETRON HCL 4 MG/2ML IJ SOLN
INTRAMUSCULAR | Status: DC | PRN
  Administered 2012-12-13: 4 mg via INTRAVENOUS

## 2012-12-13 MED ORDER — DIPHENHYDRAMINE HCL 50 MG/ML IJ SOLN
INTRAMUSCULAR | Status: AC
  Filled 2012-12-13: qty 1

## 2012-12-13 MED ORDER — MORPHINE SULFATE (PF) 0.5 MG/ML IJ SOLN
INTRAMUSCULAR | Status: DC | PRN
  Administered 2012-12-13: .2 mg via INTRATHECAL

## 2012-12-13 MED ORDER — KETOROLAC TROMETHAMINE 30 MG/ML IJ SOLN: 15.0000 mg | Freq: Once | INTRAMUSCULAR | Status: DC | PRN

## 2012-12-13 MED ORDER — WITCH HAZEL-GLYCERIN EX PADS: 1.0000 "application " | MEDICATED_PAD | CUTANEOUS | Status: DC | PRN

## 2012-12-13 MED ORDER — SODIUM CHLORIDE 0.9 % IJ SOLN: 3.0000 mL | INTRAMUSCULAR | Status: DC | PRN

## 2012-12-13 MED ORDER — SCOPOLAMINE 1 MG/3DAYS TD PT72
1.0000 | MEDICATED_PATCH | Freq: Once | TRANSDERMAL | Status: AC
  Administered 2012-12-13: 1.5 mg via TRANSDERMAL

## 2012-12-13 MED ORDER — DIPHENHYDRAMINE HCL 50 MG/ML IJ SOLN
12.5000 mg | INTRAMUSCULAR | Status: DC | PRN
  Administered 2012-12-13: 12.5 mg via INTRAVENOUS

## 2012-12-13 MED ORDER — KETOROLAC TROMETHAMINE 60 MG/2ML IM SOLN
60.0000 mg | Freq: Once | INTRAMUSCULAR | Status: AC | PRN
  Administered 2012-12-13: 60 mg via INTRAMUSCULAR

## 2012-12-13 MED ORDER — SCOPOLAMINE 1 MG/3DAYS TD PT72
MEDICATED_PATCH | TRANSDERMAL | Status: AC
  Filled 2012-12-13: qty 1

## 2012-12-13 MED ORDER — PRENATAL MULTIVITAMIN CH
1.0000 | ORAL_TABLET | Freq: Every day | ORAL | Status: DC
  Administered 2012-12-13 – 2012-12-15 (×3): 1 via ORAL
  Filled 2012-12-13 (×3): qty 1

## 2012-12-13 MED ORDER — EPHEDRINE 5 MG/ML INJ
INTRAVENOUS | Status: AC
  Filled 2012-12-13: qty 10

## 2012-12-13 MED ORDER — KETOROLAC TROMETHAMINE 30 MG/ML IJ SOLN: 30.0000 mg | Freq: Four times a day (QID) | INTRAMUSCULAR | Status: AC | PRN

## 2012-12-13 MED ORDER — OXYCODONE-ACETAMINOPHEN 5-325 MG PO TABS
1.0000 | ORAL_TABLET | ORAL | Status: DC | PRN
  Administered 2012-12-13 (×2): 1 via ORAL
  Administered 2012-12-14: 2 via ORAL
  Administered 2012-12-14: 1 via ORAL
  Administered 2012-12-14 – 2012-12-15 (×3): 2 via ORAL
  Administered 2012-12-15 (×3): 1 via ORAL
  Administered 2012-12-16 (×2): 2 via ORAL
  Filled 2012-12-13 (×5): qty 2
  Filled 2012-12-13: qty 1
  Filled 2012-12-13: qty 2
  Filled 2012-12-13 (×3): qty 1
  Filled 2012-12-13: qty 2

## 2012-12-13 MED ORDER — HYDROMORPHONE HCL PF 1 MG/ML IJ SOLN
0.2500 mg | INTRAMUSCULAR | Status: DC | PRN
  Administered 2012-12-13 (×2): 0.5 mg via INTRAVENOUS

## 2012-12-13 MED ORDER — ONDANSETRON HCL 4 MG/2ML IJ SOLN
INTRAMUSCULAR | Status: AC
  Filled 2012-12-13: qty 2

## 2012-12-13 MED ORDER — SENNOSIDES-DOCUSATE SODIUM 8.6-50 MG PO TABS
2.0000 | ORAL_TABLET | ORAL | Status: DC
  Administered 2012-12-13 – 2012-12-16 (×3): 2 via ORAL
  Filled 2012-12-13 (×3): qty 2

## 2012-12-13 MED ORDER — OXYTOCIN 10 UNIT/ML IJ SOLN
40.0000 [IU] | INTRAVENOUS | Status: DC | PRN
  Administered 2012-12-13: 40 [IU] via INTRAVENOUS

## 2012-12-13 MED ORDER — CEFAZOLIN SODIUM-DEXTROSE 2-3 GM-% IV SOLR
2.0000 g | Freq: Once | INTRAVENOUS | Status: AC
  Administered 2012-12-13: 2 g via INTRAVENOUS
  Filled 2012-12-13: qty 50

## 2012-12-13 MED ORDER — MORPHINE SULFATE (PF) 0.5 MG/ML IJ SOLN
INTRAMUSCULAR | Status: DC | PRN
  Administered 2012-12-13: 4.8 mg via INTRAVENOUS

## 2012-12-13 MED ORDER — MEPERIDINE HCL 25 MG/ML IJ SOLN: 6.2500 mg | INTRAMUSCULAR | Status: DC | PRN

## 2012-12-13 MED ORDER — FENTANYL CITRATE 0.05 MG/ML IJ SOLN
INTRAMUSCULAR | Status: DC | PRN
  Administered 2012-12-13: 12.5 ug via INTRATHECAL

## 2012-12-13 MED ORDER — KETOROLAC TROMETHAMINE 30 MG/ML IJ SOLN
30.0000 mg | Freq: Four times a day (QID) | INTRAMUSCULAR | Status: AC | PRN
  Administered 2012-12-13: 30 mg via INTRAVENOUS
  Filled 2012-12-13: qty 1

## 2012-12-13 MED ORDER — ONDANSETRON HCL 4 MG/2ML IJ SOLN: 4.0000 mg | Freq: Three times a day (TID) | INTRAMUSCULAR | Status: DC | PRN

## 2012-12-13 MED ORDER — PHENYLEPHRINE HCL 10 MG/ML IJ SOLN
INTRAMUSCULAR | Status: DC | PRN
  Administered 2012-12-13 (×2): 40 ug via INTRAVENOUS
  Administered 2012-12-13 (×4): 80 ug via INTRAVENOUS

## 2012-12-13 MED ORDER — BUPIVACAINE IN DEXTROSE 0.75-8.25 % IT SOLN
INTRATHECAL | Status: DC | PRN
  Administered 2012-12-13: 10.5 mg via INTRATHECAL

## 2012-12-13 MED ORDER — TETANUS-DIPHTH-ACELL PERTUSSIS 5-2.5-18.5 LF-MCG/0.5 IM SUSP
0.5000 mL | Freq: Once | INTRAMUSCULAR | Status: AC
  Administered 2012-12-14: 0.5 mL via INTRAMUSCULAR
  Filled 2012-12-13: qty 0.5

## 2012-12-13 MED ORDER — NALOXONE HCL 0.4 MG/ML IJ SOLN: 0.4000 mg | INTRAMUSCULAR | Status: DC | PRN

## 2012-12-13 MED ORDER — CITRIC ACID-SODIUM CITRATE 334-500 MG/5ML PO SOLN
ORAL | Status: AC
  Administered 2012-12-13: 30 mL
  Filled 2012-12-13: qty 15

## 2012-12-13 MED ORDER — FENTANYL CITRATE 0.05 MG/ML IJ SOLN
INTRAMUSCULAR | Status: AC
  Filled 2012-12-13: qty 2

## 2012-12-13 MED ORDER — DIPHENHYDRAMINE HCL 50 MG/ML IJ SOLN: 25.0000 mg | INTRAMUSCULAR | Status: DC | PRN

## 2012-12-13 MED ORDER — IBUPROFEN 600 MG PO TABS
600.0000 mg | ORAL_TABLET | Freq: Four times a day (QID) | ORAL | Status: DC
  Administered 2012-12-13 – 2012-12-16 (×10): 600 mg via ORAL
  Filled 2012-12-13 (×10): qty 1

## 2012-12-13 MED ORDER — NALOXONE HCL 1 MG/ML IJ SOLN
1.0000 ug/kg/h | INTRAVENOUS | Status: DC | PRN
  Filled 2012-12-13: qty 2

## 2012-12-13 MED ORDER — PHENYLEPHRINE HCL 10 MG/ML IJ SOLN
INTRAMUSCULAR | Status: AC
  Filled 2012-12-13: qty 1

## 2012-12-13 MED ORDER — LACTATED RINGERS IV SOLN
INTRAVENOUS | Status: DC
  Administered 2012-12-13: 05:00:00 via INTRAVENOUS

## 2012-12-13 MED ORDER — MENTHOL 3 MG MT LOZG: 1.0000 | LOZENGE | OROMUCOSAL | Status: DC | PRN

## 2012-12-13 MED ORDER — SIMETHICONE 80 MG PO CHEW: 80.0000 mg | CHEWABLE_TABLET | ORAL | Status: DC | PRN

## 2012-12-13 SURGICAL SUPPLY — 32 items
ADH SKN CLS APL DERMABOND .7 (GAUZE/BANDAGES/DRESSINGS) ×1
CLAMP CORD UMBIL (MISCELLANEOUS) IMPLANT
CLOTH BEACON ORANGE TIMEOUT ST (SAFETY) ×2 IMPLANT
DERMABOND ADVANCED (GAUZE/BANDAGES/DRESSINGS) ×1
DERMABOND ADVANCED .7 DNX12 (GAUZE/BANDAGES/DRESSINGS) ×1 IMPLANT
DRAPE LG THREE QUARTER DISP (DRAPES) ×4 IMPLANT
DRSG OPSITE POSTOP 4X10 (GAUZE/BANDAGES/DRESSINGS) ×2 IMPLANT
DURAPREP 26ML APPLICATOR (WOUND CARE) ×2 IMPLANT
ELECT REM PT RETURN 9FT ADLT (ELECTROSURGICAL) ×2
ELECTRODE REM PT RTRN 9FT ADLT (ELECTROSURGICAL) ×1 IMPLANT
EXTRACTOR VACUUM M CUP 4 TUBE (SUCTIONS) IMPLANT
GLOVE BIO SURGEON STRL SZ8.5 (GLOVE) ×2 IMPLANT
GOWN PREVENTION PLUS XLARGE (GOWN DISPOSABLE) ×2 IMPLANT
GOWN PREVENTION PLUS XXLARGE (GOWN DISPOSABLE) ×2 IMPLANT
KIT ABG SYR 3ML LUER SLIP (SYRINGE) ×1 IMPLANT
NDL HYPO 25X5/8 SAFETYGLIDE (NEEDLE) ×1 IMPLANT
NEEDLE HYPO 25X5/8 SAFETYGLIDE (NEEDLE) ×2 IMPLANT
NS IRRIG 1000ML POUR BTL (IV SOLUTION) ×2 IMPLANT
PACK C SECTION WH (CUSTOM PROCEDURE TRAY) ×2 IMPLANT
PAD OB MATERNITY 4.3X12.25 (PERSONAL CARE ITEMS) ×2 IMPLANT
SUT CHROMIC 0 CT 802H (SUTURE) ×2 IMPLANT
SUT CHROMIC 1 CTX 36 (SUTURE) ×4 IMPLANT
SUT CHROMIC 2 0 SH (SUTURE) ×2 IMPLANT
SUT GUT PLAIN 0 CT-3 TAN 27 (SUTURE) IMPLANT
SUT MON AB 4-0 PS1 27 (SUTURE) ×2 IMPLANT
SUT VIC AB 0 CT1 18XCR BRD8 (SUTURE) IMPLANT
SUT VIC AB 0 CT1 8-18 (SUTURE)
SUT VIC AB 0 CTX 36 (SUTURE) ×4
SUT VIC AB 0 CTX36XBRD ANBCTRL (SUTURE) ×2 IMPLANT
TOWEL OR 17X24 6PK STRL BLUE (TOWEL DISPOSABLE) ×2 IMPLANT
TRAY FOLEY CATH 14FR (SET/KITS/TRAYS/PACK) ×2 IMPLANT
WATER STERILE IRR 1000ML POUR (IV SOLUTION) ×2 IMPLANT

## 2012-12-13 NOTE — Op Note (Signed)
preop diagnosis 4 weeks and 3 days pregnancy with premature labor  Fully  dilated bulging membranes and unable to stop uterine irritability Postop diagnosis the same Surgeon Dr. Francoise Ceo Anesthesia spinal Procedure after the spinal administered abdomen prepped and draped  Bladder emptied  with a Foley catheter transverse suprapubic incision made carried down to the rectus fascia fascia cleaned and incised the length of the incision recti muscles retracted laterally peritoneum incised longitudinally there was no lower segment developed  Classical  incision was made on the uterus patient delivered of a female Apgar 46 team in attendance the placenta posterior removed manually and sent to pathology the uterine cavity was clean with dry laps uterine incision closed in 2 layers with continuous suture of #1 chromic hemostasis satisfactory lap and sponge counts correct abdomen closed in layers peritoneum continuous with of 0 Dexon fascia continuous within of 0 Dexon skin closed with staples blood loss 400 cc patient tolerated the procedure well

## 2012-12-13 NOTE — Anesthesia Postprocedure Evaluation (Signed)
Anesthesia Post Note  Patient: Misty Walsh  Procedure(s) Performed: Procedure(s) (LRB): PRIMARY CESAREAN SECTION (N/A)  Anesthesia type: Spinal  Patient location: PACU  Post pain: Pain level controlled  Post assessment: Post-op Vital signs reviewed  Last Vitals:  Filed Vitals:   12/13/12 0230  BP: 134/64  Pulse: 104  Temp:   Resp: 16    Post vital signs: Reviewed  Level of consciousness: awake  Complications: No apparent anesthesia complications

## 2012-12-13 NOTE — Anesthesia Postprocedure Evaluation (Signed)
  Anesthesia Post-op Note  Patient: Misty Walsh  Procedure(s) Performed: Procedure(s): PRIMARY CESAREAN SECTION (N/A)  Patient Location: [facility]  Anesthesia Type:Spinal  Level of Consciousness: awake and alert   Airway and Oxygen Therapy: Patient Spontanous Breathing  Post-op Pain: mild  Post-op Assessment: Patient's Cardiovascular Status Stable, Respiratory Function Stable, No signs of Nausea or vomiting, Adequate PO intake, No headache, No residual numbness and No residual motor weakness  Post-op Vital Signs: stable  Complications: No apparent anesthesia complications

## 2012-12-13 NOTE — Anesthesia Procedure Notes (Signed)
Spinal  Patient location during procedure: OR Start time: 12/13/2012 12:27 AM End time: 12/13/2012 12:29 AM Staffing Anesthesiologist: Leilani Able Performed by: anesthesiologist  Preanesthetic Checklist Completed: patient identified, surgical consent, pre-op evaluation, timeout performed, IV checked, risks and benefits discussed and monitors and equipment checked Spinal Block Patient position: sitting Prep: DuraPrep Patient monitoring: heart rate, cardiac monitor, continuous pulse ox and blood pressure Approach: midline Location: L3-4 Injection technique: single-shot Needle Needle type: Pencan  Needle gauge: 24 G Needle length: 9 cm Needle insertion depth: 6 cm Assessment Sensory level: T4

## 2012-12-13 NOTE — Transfer of Care (Signed)
Immediate Anesthesia Transfer of Care Note  Patient: Misty Walsh  Procedure(s) Performed: Procedure(s): PRIMARY CESAREAN SECTION (N/A)  Patient Location: PACU  Anesthesia Type:Spinal  Level of Consciousness: awake  Airway & Oxygen Therapy: Patient Spontanous Breathing  Post-op Assessment: Report given to PACU RN and Post -op Vital signs reviewed and stable  Post vital signs: stable  Complications: No apparent anesthesia complications

## 2012-12-13 NOTE — Progress Notes (Signed)
Patient ID: Misty Walsh, female   DOB: 1990/09/23, 22 y.o.   MRN: [id number] Tonight patient started having some bleeding and some abdominal discomfort repeat ultrasound showed that   baby was   transverse lie with a feet in the cervix and it membranes hourglassing through the cervix digital exam revealed she was fully dilated with a tense bag and discuss with patient and MFM  Findings  should proceed with delivery by C-section because of   uterine irritability and    being fully dilated  Patient had been started on magnesium sulfate 4 g loading 2 g an hour but still complained of contractions

## 2012-12-13 NOTE — Progress Notes (Signed)
Patient ID: Misty Walsh, female   DOB: [birth year], 22 y.o.   MRN: [id number] Postop day 0 Vital signs normal Incision normal Lochia moderate Doing well and the baby weighed 1 lb. 11 oz. At birth and doing well

## 2012-12-13 NOTE — Lactation Note (Signed)
This note was copied from the chart of Misty Walsh. Lactation Consultation Note" Initial visit with this mom of NICU baby. She has just gotten back from visiting baby in NICU. Reports that she has pumped once so far. Encouraged to pump q 3 hours to promote a good milk supply. States RN reviewed cleaning of pump pieces and set up of pump. No questions at present. NICU booklet left with mom. To call prn  Patient Name: Misty Levana Minetti [id number] Date: 12/13/2012 Reason for consult: Initial assessment   Maternal Data Formula Feeding for Exclusion: Yes Reason for exclusion: Admission to Intensive Care Unit (ICU) post-partum Infant to breast within first hour of birth: No Breastfeeding delayed due to:: Infant status Does the patient have breastfeeding experience prior to this delivery?: No  Feeding    LATCH Score/Interventions                      Lactation Tools Discussed/Used Pump Review: Setup, frequency, and cleaning Initiated by:: RN Date initiated:: 12/13/12   Consult Status      Pamelia Hoit 12/13/2012, 8:50 AM

## 2012-12-13 NOTE — Anesthesia Preprocedure Evaluation (Signed)
Anesthesia Evaluation  Patient identified by MRN, date of birth, ID band Patient awake    Reviewed: Allergy & Precautions, H&P , NPO status , Patient's Chart, lab work & pertinent test results  Airway Mallampati: II TM Distance: >3 FB Neck ROM: full    Dental no notable dental hx.    Pulmonary neg pulmonary ROS,    Pulmonary exam normal       Cardiovascular negative cardio ROS      Neuro/Psych negative neurological ROS     GI/Hepatic Neg liver ROS, GERD-  Medicated,  Endo/Other  Morbid obesity  Renal/GU      Musculoskeletal   Abdominal (+) + obese,   Peds  Hematology negative hematology ROS (+)   Anesthesia Other Findings   Reproductive/Obstetrics (+) Pregnancy                           Anesthesia Physical Anesthesia Plan  ASA: III and emergent  Anesthesia Plan: Spinal   Post-op Pain Management:    Induction:   Airway Management Planned:   Additional Equipment:   Intra-op Plan:   Post-operative Plan:   Informed Consent: I have reviewed the patients History and Physical, chart, labs and discussed the procedure including the risks, benefits and alternatives for the proposed anesthesia with the patient or authorized representative who has indicated his/her understanding and acceptance.     Plan Discussed with: CRNA and Surgeon  Anesthesia Plan Comments:         Anesthesia Quick Evaluation

## 2012-12-14 ENCOUNTER — Encounter (HOSPITAL_COMMUNITY): Payer: Self-pay | Admitting: Obstetrics

## 2012-12-14 NOTE — Progress Notes (Signed)
Patient ID: Misty Walsh, female   DOB: December 23, 1990, 22 y.o.   MRN: [id number] Subjective: POD# 1 s/p Cesarean Delivery.  Indications: breech and PTL  RH status/Rubella reviewed. Feeding: unknown    Objective: Vital signs in last 24 hours: BP 106/50  Pulse 108  Temp(Src) 98.3 F (36.8 C) (Oral)  Resp 18  Ht 5\' 4"  (1.626 m)  Wt 108.591 kg (239 lb 6.4 oz)  BMI 41.07 kg/m2  SpO2 100%  LMP 06/24/2012       Physical Exam:  No exam.  Pt w/infant in NICU    Recent Labs  12/13/12 0515  HGB 10.1*  HCT 30.1*      Assessment/Plan: 22 y.o.  status post Cesarean section. POD# 1.   Doing well, stable.              Advance diet as tolerated Start po pain meds D/C foley  HLIV  Ambulate IS Routine post-op care  JACKSON-MOORE,Demetre Monaco A 12/14/2012, 8:50 AM

## 2012-12-14 NOTE — Lactation Note (Signed)
This note was copied from the chart of Misty Walsh. Lactation Consultation Note  Patient Name: Misty Eman Rynders [id number] Date: 12/14/2012 Reason for consult: Follow-up assessment;NICU baby Mom was pumping when I arrived. Pump schedule discussed with Mom. Advised to pump on preemie setting every 3 hours for 15 minutes. Mom is not receiving any EBM yet. Phone number left for Mom to call [facility] about a pump for home use. Mom reports she called this AM and left a message. Storage guidelines and cleaning of pump pieces reviewed with Mom.   Maternal Data    Feeding    LATCH Score/Interventions                      Lactation Tools Discussed/Used Tools: Pump Breast pump type: Double-Electric Breast Pump WIC Program: Yes   Consult Status Consult Status: Follow-up Date: 12/15/12 Follow-up type: In-patient    Alfred Levins 12/14/2012, 4:14 PM

## 2012-12-14 NOTE — Progress Notes (Signed)
Continued ur review completed. 

## 2012-12-15 ENCOUNTER — Encounter (HOSPITAL_COMMUNITY): Payer: Self-pay | Admitting: Neonatology

## 2012-12-15 LAB — URINALYSIS, ROUTINE W REFLEX MICROSCOPIC
Glucose, UA: NEGATIVE mg/dL
Ketones, ur: NEGATIVE mg/dL
Leukocytes, UA: NEGATIVE
Protein, ur: NEGATIVE mg/dL

## 2012-12-15 NOTE — Progress Notes (Signed)
Pt expresses concern about the pain she is feeling. Pt reports feeling sharp pains in perineal and suprapubic areas, also in lower back. Pt states she also feels the sharp pains when urinating. She said its the same pains that brought her to the hospital when pregnant.

## 2012-12-15 NOTE — Progress Notes (Signed)
12/15/12 1400  Clinical Encounter Type  Visited With Patient and family together (FOB Mark in NICU)  Visit Type Follow-up;Spiritual support;Social support  Referral From Patient  Spiritual Encounters  Spiritual Needs Emotional  Stress Factors  Patient Stress Factors Major life changes (preemie in NICU)  Family Stress Factors Major life changes (preemie in NICU)   Spiritual Care has been following Luvenia in ante.  She excitedly invited me to ride down from [place] with her to meet baby Guedes this afternoon, hopes I'll check in tomorrow, and gave me a big hug upon my departure.    Provided pastoral listening, reflection, and encouragement, serving as witness to this milestone in [employer] [place] and Mark's lives.  Will continue to follow for support.  [address] [place], [place] [phone]

## 2012-12-15 NOTE — Progress Notes (Signed)
Subjective: Postpartum Day 2: Cesarean Delivery Patient reports tolerating PO.   No problems voiding.  Objective: Vital signs in last 24 hours: Temp:  [98.1 F (36.7 C)-98.5 F (36.9 C)] 98.5 F (36.9 C) (10/28 0622) Pulse Rate:  [115-128] 128 (10/28 0622) Resp:  [18-20] 18 (10/28 0622) BP: (116-133)/(72-92) 116/87 mmHg (10/28 0622) SpO2:  [99 %-100 %] 100 % (10/28 0622)  Physical Exam:  General: alert and no distress Lochia: appropriate Uterine Fundus: firm Incision: healing well DVT Evaluation: No evidence of DVT seen on physical exam.   Recent Labs  12/13/12 0515  HGB 10.1*  HCT 30.1*    Assessment/Plan: Status post Cesarean section. Doing well postoperatively.  Continue current care.  HARPER,CHARLES A 12/15/2012, 8:28 AM

## 2012-12-16 ENCOUNTER — Encounter (HOSPITAL_COMMUNITY): Payer: Self-pay

## 2012-12-16 MED ORDER — NORETHINDRONE 0.35 MG PO TABS: 1.0000 | ORAL_TABLET | Freq: Every day | ORAL | Status: DC

## 2012-12-16 MED ORDER — OXYCODONE-ACETAMINOPHEN 5-325 MG PO TABS: 1.0000 | ORAL_TABLET | ORAL | Status: DC | PRN

## 2012-12-16 NOTE — Discharge Summary (Signed)
  Obstetric Discharge Summary Reason for Admission: pyelonephritis, threatened preterm labor Prenatal Procedures: none Intrapartum Procedures: cesarean: classical Postpartum Procedures: none Complications-Operative and Postpartum: none  Hemoglobin  Date Value Range Status  12/13/2012 10.1* 12.0 - 15.0 g/dL Final     HCT  Date Value Range Status  12/13/2012 30.1* 36.0 - 46.0 % Final    Physical Exam:  General: alert Lochia: appropriate Uterine: firm Incision: clean, dry and intact DVT Evaluation: No evidence of DVT seen on physical exam.  Discharge Diagnoses: Principal Problem:   Pyelonephritis complicating pregnancy in second trimester Active Problems:   Threatened preterm labor   Preterm premature rupture of membranes in second trimester   Cesarean delivery delivered   Discharge Information: Date: 12/16/2012 Activity: pelvic rest Diet: routine Medications:  Prior to Admission medications   Medication Sig Start Date End Date Taking? Authorizing Provider  loratadine (CLARITIN) 10 MG tablet Take 1 tablet (10 mg total) by mouth daily. 10/29/12  Yes Brock Bad, MD  norethindrone (ORTHO MICRONOR) 0.35 MG tablet Take 1 tablet (0.35 mg total) by mouth daily. 12/16/12   Antionette Char, MD  oxyCODONE-acetaminophen (PERCOCET/ROXICET) 5-325 MG per tablet Take 1-2 tablets by mouth every 4 (four) hours as needed. 12/16/12   Antionette Char, MD    Condition: stable Instructions: refer to routine discharge instructions Discharge to: home Follow-up Information   Follow up with HARPER,CHARLES A, MD. Schedule an appointment as soon as possible for a visit in 2 weeks.   Specialty:  Obstetrics and Gynecology   Contact information:   [address] [place] [address] [phone]       Newborn Data:  Live born female  Birth Weight: 1 lb 10.8 oz (760 g) APGAR: 4, 6   Home with mother.  JACKSON-MOORE,Colbie Sliker A 12/16/2012, 8:48 AM

## 2012-12-16 NOTE — Lactation Note (Addendum)
This note was copied from the chart of Boy Kiylah Loyer. Lactation Consultation Note Mom states she is pumping every 3 hours around the clock, and is also hand expressing, states she is getting drops of colostrum. Enc mom to continue pumping and hand expression. Mom on phone with WIC to get her pump. Hand pump provided with inst for use, in case mom needs to pump before she gets the [facility] pump.  Mom has no questions or concerns. Enc mom to call the lactation office if she has any concerns, enc mom that the BFSG is available to her even if her baby is in NICU.  Will follow up PRN in NICU.   Patient Name: Boy Caley Ciaramitaro ZOXWR'U Date: 12/16/2012 Reason for consult: Follow-up assessment;Infant < 6lbs;NICU baby   Maternal Data Formula Feeding for Exclusion: Yes Reason for exclusion: Admission to Intensive Care Unit (ICU) post-partum Infant to breast within first hour of birth: No Breastfeeding delayed due to:: Infant status Has patient been taught Hand Expression?: Yes  Feeding Feeding Type: Formula Length of feed: 5 min  LATCH Score/Interventions                      Lactation Tools Discussed/Used     Consult Status Consult Status: PRN    Lenard Forth 12/16/2012, 9:23 AM

## 2012-12-16 NOTE — Progress Notes (Signed)
Pt discharged to home with significant other.  Condition stable.  Pt ambulated to car with RN.  No equipment for home ordered at discharge. 

## 2012-12-18 ENCOUNTER — Encounter (HOSPITAL_COMMUNITY): Payer: Self-pay | Admitting: *Deleted

## 2012-12-18 ENCOUNTER — Inpatient Hospital Stay (HOSPITAL_COMMUNITY)
Admission: AD | Admit: 2012-12-18 | Discharge: 2012-12-18 | Disposition: A | Discharge status: Home / Self Care | Payer: BC Managed Care – PPO | Source: Ambulatory Visit | Attending: Obstetrics | Admitting: Obstetrics

## 2012-12-18 DIAGNOSIS — R58 Hemorrhage, not elsewhere classified: Secondary | ICD-10-CM

## 2012-12-18 DIAGNOSIS — O909 Complication of the puerperium, unspecified: Secondary | ICD-10-CM | POA: Insufficient documentation

## 2012-12-18 DIAGNOSIS — Z98891 History of uterine scar from previous surgery: Secondary | ICD-10-CM

## 2012-12-18 DIAGNOSIS — I998 Other disorder of circulatory system: Secondary | ICD-10-CM

## 2012-12-18 HISTORY — DX: History of uterine scar from previous surgery: Z98.891

## 2012-12-18 LAB — URINALYSIS, ROUTINE W REFLEX MICROSCOPIC
Bilirubin Urine: NEGATIVE
Nitrite: NEGATIVE
Protein, ur: NEGATIVE mg/dL
Specific Gravity, Urine: 1.02 (ref 1.005–1.030)
Urobilinogen, UA: 0.2 mg/dL (ref 0.0–1.0)

## 2012-12-18 LAB — CBC WITH DIFFERENTIAL/PLATELET
Basophils Relative: 0 % (ref 0–1)
Eosinophils Absolute: 0.4 10*3/uL (ref 0.0–0.7)
Eosinophils Relative: 4 % (ref 0–5)
HCT: 24.2 % — ABNORMAL LOW (ref 36.0–46.0)
Hemoglobin: 8.3 g/dL — ABNORMAL LOW (ref 12.0–15.0)
Lymphocytes Relative: 26 % (ref 12–46)
MCH: 32.2 pg (ref 26.0–34.0)
MCHC: 34.3 g/dL (ref 30.0–36.0)
MCV: 93.8 fL (ref 78.0–100.0)
Monocytes Absolute: 1.2 10*3/uL — ABNORMAL HIGH (ref 0.1–1.0)
Monocytes Relative: 12 % (ref 3–12)
Neutro Abs: 5.8 10*3/uL (ref 1.7–7.7)
Neutrophils Relative %: 58 % (ref 43–77)
Platelets: 232 10*3/uL (ref 150–400)
RBC: 2.58 MIL/uL — ABNORMAL LOW (ref 3.87–5.11)
WBC: 10.1 10*3/uL (ref 4.0–10.5)

## 2012-12-18 LAB — URINE MICROSCOPIC-ADD ON

## 2012-12-18 MED ORDER — AMOXICILLIN-POT CLAVULANATE 875-125 MG PO TABS: 1.0000 | ORAL_TABLET | Freq: Two times a day (BID) | ORAL | Status: DC

## 2012-12-18 NOTE — MAU Note (Signed)
Noticed bruising around incision, states does bruise easy, but didn't know if that was normal.  Also passing blood clots.

## 2012-12-18 NOTE — Progress Notes (Signed)
Clinical Social Work Department PSYCHOSOCIAL ASSESSMENT - MATERNAL/CHILD 12/17/2012  Patient:  Misty Walsh, Misty Walsh  Account Number:  [id number]  Admit Date:  11/21/2012  Marjo Bicker Name:   Della Goo    Clinical Social Worker:  Lulu Riding, LCSW   Date/Time:  12/17/2012 01:30 PM  Date Referred:  12/17/2012   Referral source  NICU     Referred reason  NICU   Other referral source:    I:  FAMILY / HOME ENVIRONMENT Child's legal guardian:  PARENT  Guardian - Name Guardian - Age Guardian - Address  Misty Walsh 22 [address] [place], [place] [address]  Pearletha Furl  Does not live with MOB, but frequently stays there   Other household support members/support persons Other support:   Parents report having good supports.    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   Financial resources:  Media planner If [employer] - [place]:  [employer] Other  Sales executive  WIC   School / Grade:   Maternity Care Coordinator / Child Services Coordination / Early Interventions:   CC4C, CDSA, Early Intervention  Cultural issues impacting care:   None stated    III  STRENGTHS Strengths  Compliance with medical plan  Supportive family/friends  Understanding of illness   Strength comment:    IV  RISK FACTORS AND CURRENT PROBLEMS Current Problem:  YES   Risk Factor & Current Problem Patient Issue Family Issue Risk Factor / Current Problem Comment  Mental Illness Y N Hx of Dep/Anx   N N     V  SOCIAL WORK ASSESSMENT CSW initially met with MOB at baby's bedside to introduce myself and ask if we could sit down and talk later today. MOB was very pleasant and stated she would be here all day and it would be fine to talk any time.  CSW apologized for not being able to talk with her while she was a patient. Later CSW saw MOB and FOB and asked  if it was a good time to talk and they agreed.  We sat in the conference room. They report baby is doing well at this time and that they feel they have had their ups and downs so far, but are coping well overall.  CSW talked about common emotions related to the NICU experience and the importance of process feelings.  CSW informed them of ongoing support services provided by NICU CSW and encouraged them to call any time.  They both seemed very appreciative and agreed. CSW discussed signs and symptoms of PPD and asked MOB to call her doctor or CSW if she has emotional concerns at any time.  CSW asked about a hx of Anxiety and Depression documented in MOB's PNR.  MOB states she experienced symptoms after her mother died in 2006/07/26 and took an antidepressant at that time, which she did not like.  She states no emotional concerns at this time and adds that she would not be open to medication in the future, but would be open to counseling if concerns arise.  They seem excited about baby and seem to have a good understanding of his medical situation.  They state they do not have any baby supplies for him at this time since MOB was inpatient for the past month and baby came early.  CSW encouraged them to prepare as they can to let CSW know  of needs they may still have closer to d/c.  CSW informed Family Support Network of probable [employer] needs in the future.  CSW informed parents of baby's eligibility for SSI and they wish to apply.  CSW to assist.      VI SOCIAL WORK PLAN Social Work Plan  Psychosocial Support/Ongoing Assessment of Needs   Type of pt/family education:   PPD signs and symptoms  Ongoing support offered by NICU CSW  SSI eligibility   If child protective services report - county:   If child protective services report - date:   Information/referral to community resources comment:   Family Support Network  Counseling resources in the future if MOB desires.   Other social work plan:

## 2012-12-18 NOTE — MAU Provider Note (Signed)
History     CSN: [id number]  Arrival date and time: 12/18/12 1112   None     Chief Complaint  Patient presents with  . Post-op Problem  . Vaginal Bleeding   HPI Misty Walsh is 22 y.o. G1P0100 presenting with heavy bleeding after C-Section delivery on 10/26 by Dr. Gaynell Face for Dr. Clearance Coots.  Delivery at [redacted]w[redacted]d for premature rupture of membranes.  Infant is doing well in NCIU. C Section for premature fetus.  She is nursing.  That is going well.   She was discharged home 2 days ago with bleeding she describes as like a period.  Yesterday she noticed bruising around the incision.  She is now passing large blood clots.  Abdominal pain with moving.  Does not have pain related to bleeding or passing of clots.  Denies fever, chills, nausea and vomiting.  She is actually more concerned about the bruising around her incision.    RN Note: Noticed bruising around incision, states does bruise easy, but didn't know if that was normal. Also passing blood clots.       Past Medical History  Diagnosis Date  . Hypertension   . Ovarian cyst   . Anxiety   . Chlamydia   . Depression med made her navel itching and  made her sleepy so she quit taking them  . GERD (gastroesophageal reflux disease)     Past Surgical History  Procedure Laterality Date  . Breast biopsy      benign  . Cesarean section N/A 12/13/2012    Procedure: PRIMARY CESAREAN SECTION;  Surgeon: Kathreen Cosier, MD;  Location: [facility];  Service: Obstetrics;  Laterality: N/A;    Family History  Problem Relation Age of Onset  . Diabetes Mother   . Heart disease Mother   . Hypertension Mother   . Depression Mother   . Stroke Mother   . Arthritis Mother   . Stroke Father   . Asthma Sister   . Kidney disease Brother     genetic condition  . Diabetes Maternal Aunt   . Hypertension Maternal Aunt   . Asthma Maternal Aunt   . Epilepsy Maternal Aunt   . Hypertension Maternal Grandmother   . Dementia Maternal Grandmother    . Heart disease Maternal Grandmother   . Diabetes Paternal Grandmother   . Stroke Father     History  Substance Use Topics  . Smoking status: Former Smoker -- 0.25 packs/day for 5 years    Types: Cigarettes    Quit date: 07/19/2012  . Smokeless tobacco: Never Used  . Alcohol Use: No     Comment: occasionallly    Allergies:  Allergies  Allergen Reactions  . Macrobid [Nitrofurantoin Macrocrystal] Itching    Prescriptions prior to admission  Medication Sig Dispense Refill  . loratadine (CLARITIN) 10 MG tablet Take 1 tablet (10 mg total) by mouth daily.  30 tablet  11  . oxyCODONE-acetaminophen (PERCOCET/ROXICET) 5-325 MG per tablet Take 1-2 tablets by mouth every 4 (four) hours as needed.  30 tablet  0  . norethindrone (ORTHO MICRONOR) 0.35 MG tablet Take 1 tablet (0.35 mg total) by mouth daily.  28 tablet  11    Review of Systems  Constitutional: Negative for fever and chills.  Gastrointestinal: Negative for nausea, vomiting, abdominal pain, diarrhea and constipation.  Genitourinary:       + for vaginal bleeding equal to a period--clots  Skin:       Bruising above the incision  Physical Exam   Blood pressure 125/79, pulse 105, temperature 98.2 F (36.8 C), temperature source Oral, resp. rate 18, last menstrual period 06/24/2012, currently breastfeeding.  Physical Exam  Constitutional: She is oriented to person, place, and time. She appears well-developed and well-nourished. No distress.  HENT:  Head: Normocephalic.  Neck: Normal range of motion.  Cardiovascular: Normal rate.   Respiratory: Effort normal.  GI: Soft. She exhibits no distension and no mass. There is no tenderness. There is no rebound and no guarding.  The incision is well covered with surgical dressing.  Above the incision is a large area of bruising that is slightly tender to touch.  Above the dark purple bruising is lighter pink area that looks more like bruising than cellulitis.  It is not hot to  touch and no more tender than the area below it.  There does not appear to be drainage from the incision  Genitourinary:  Attempted speculum exam.  Was uncomfortable to the patient and exam not performed.  There is a small amount of bleeding on the perineum but is not actively bleeding.  Clots not seen.  Neurological: She is alert and oriented to person, place, and time.  Skin: Skin is warm and dry.  Psychiatric: She has a normal mood and affect. Her behavior is normal.   Results for orders placed during the hospital encounter of 12/18/12 (from the past 24 hour(s))  CBC WITH DIFFERENTIAL     Status: Abnormal   Collection Time    12/18/12 11:39 AM      Result Value Range   WBC 10.1  4.0 - 10.5 K/uL   RBC 2.58 (*) 3.87 - 5.11 MIL/uL   Hemoglobin 8.3 (*) 12.0 - 15.0 g/dL   HCT 16.1 (*) 09.6 - 04.5 %   MCV 93.8  78.0 - 100.0 fL   MCH 32.2  26.0 - 34.0 pg   MCHC 34.3  30.0 - 36.0 g/dL   RDW 40.9  81.1 - 91.4 %   Platelets 232  150 - 400 K/uL   Neutrophils Relative % 58  43 - 77 %   Neutro Abs 5.8  1.7 - 7.7 K/uL   Lymphocytes Relative 26  12 - 46 %   Lymphs Abs 2.6  0.7 - 4.0 K/uL   Monocytes Relative 12  3 - 12 %   Monocytes Absolute 1.2 (*) 0.1 - 1.0 K/uL   Eosinophils Relative 4  0 - 5 %   Eosinophils Absolute 0.4  0.0 - 0.7 K/uL   Basophils Relative 0  0 - 1 %   Basophils Absolute 0.0  0.0 - 0.1 K/uL  URINALYSIS, ROUTINE W REFLEX MICROSCOPIC     Status: Abnormal   Collection Time    12/18/12  1:08 PM      Result Value Range   Color, Urine AMBER (*) YELLOW   APPearance HAZY (*) CLEAR   Specific Gravity, Urine 1.020  1.005 - 1.030   pH 6.0  5.0 - 8.0   Glucose, UA NEGATIVE  NEGATIVE mg/dL   Hgb urine dipstick LARGE (*) NEGATIVE   Bilirubin Urine NEGATIVE  NEGATIVE   Ketones, ur NEGATIVE  NEGATIVE mg/dL   Protein, ur NEGATIVE  NEGATIVE mg/dL   Urobilinogen, UA 0.2  0.0 - 1.0 mg/dL   Nitrite NEGATIVE  NEGATIVE   Leukocytes, UA MODERATE (*) NEGATIVE  URINE MICROSCOPIC-ADD ON      Status: Abnormal   Collection Time    12/18/12  1:08 PM  Result Value Range   Squamous Epithelial / LPF FEW (*) RARE   WBC, UA 11-20  <3 WBC/hpf   RBC / HPF TOO NUMEROUS TO COUNT  <3 RBC/hpf   Bacteria, UA FEW (*) RARE   MAU Course  Procedures  MDM MSE and physical exam reported to Dr. Clearance Coots. UA pending.  Reassure   Order given discharge with Rx for Augmentin 875mg  bid X 1 week.  Assessment and Plan  A:  Bruising above the incisions site on Day 5 Post Partum      Vaginal bleeding  P:  Rx for Augmentin 875mg  bid X 1 week      Reassured bruising occurs with surgical incision      Call Monday for post partal appointment with Dr. Alvia Grove M 12/18/2012, 2:01 PM

## 2012-12-19 LAB — URINE CULTURE

## 2012-12-24 ENCOUNTER — Inpatient Hospital Stay (HOSPITAL_COMMUNITY)
Admission: AD | Admit: 2012-12-24 | Discharge: 2012-12-24 | Disposition: A | Discharge status: Home / Self Care | Payer: BC Managed Care – PPO | Source: Ambulatory Visit | Attending: Obstetrics | Admitting: Obstetrics

## 2012-12-29 ENCOUNTER — Encounter: Payer: BC Managed Care – PPO | Admitting: Obstetrics

## 2013-01-01 ENCOUNTER — Encounter: Payer: BC Managed Care – PPO | Admitting: Obstetrics

## 2013-01-06 ENCOUNTER — Ambulatory Visit: Payer: BC Managed Care – PPO | Admitting: Obstetrics

## 2013-01-06 DIAGNOSIS — L0390 Cellulitis, unspecified: Secondary | ICD-10-CM | POA: Insufficient documentation

## 2013-01-12 ENCOUNTER — Encounter: Payer: BC Managed Care – PPO | Admitting: Obstetrics

## 2013-01-18 ENCOUNTER — Encounter: Payer: Self-pay | Admitting: Obstetrics

## 2013-01-18 ENCOUNTER — Ambulatory Visit (INDEPENDENT_AMBULATORY_CARE_PROVIDER_SITE_OTHER): Payer: BC Managed Care – PPO | Admitting: Obstetrics

## 2013-01-18 VITALS — BP 126/83 | HR 99 | Temp 97.9°F | Ht 64.0 in | Wt 248.0 lb

## 2013-01-18 DIAGNOSIS — O909 Complication of the puerperium, unspecified: Secondary | ICD-10-CM | POA: Insufficient documentation

## 2013-01-18 NOTE — Progress Notes (Signed)
Subjective:     Misty Walsh is a 22 y.o. female who presents for a postpartum visit. She is 4 weeks postpartum following a low cervical transverse Cesarean section. I have fully reviewed the prenatal and intrapartum course. The delivery was at 24.4 gestational weeks. Outcome: primary cesarean section, low transverse incision. Anesthesia: spinal. Postpartum course has been complicated by a incision with a wound VAC. Baby's course has been complicated by NICU stay . Baby is feeding by no feeding at [facility] time. Bleeding no bleeding. Bowel function is normal. Bladder function is normal. Patient is not sexually active. Contraception method is abstinence. Postpartum depression screening: positive.  The following portions of the patient's history were reviewed and updated as appropriate: allergies, current medications, past family history, past medical history, past social history, past surgical history and problem list.  Review of Systems Pertinent items are noted in HPI.   Objective:    BP 126/83  Pulse 99  Temp(Src) 97.9 F (36.6 C)  Ht 5\' 4"  (1.626 m)  Wt 248 lb (112.492 kg)  BMI 42.55 kg/m2  Breastfeeding? Yes  General:  alert and no distress Abdomen:  Soft, NT.  Incision clean.   Assessment:    Wound infection.  Plan:    1. Contraception: abstinence 2. Continue wound vac 3. Follow up in: 4 weeks or as needed.

## 2013-01-25 ENCOUNTER — Encounter: Payer: Self-pay | Admitting: Obstetrics

## 2013-02-16 ENCOUNTER — Ambulatory Visit: Payer: BC Managed Care – PPO | Admitting: Obstetrics

## 2013-12-20 ENCOUNTER — Encounter: Payer: Self-pay | Admitting: Obstetrics

## 2014-03-28 ENCOUNTER — Encounter (HOSPITAL_COMMUNITY): Payer: Self-pay

## 2014-03-28 ENCOUNTER — Emergency Department (HOSPITAL_COMMUNITY)
Admission: EM | Admit: 2014-03-28 | Discharge: 2014-03-28 | Disposition: A | Discharge status: Home / Self Care | Payer: Medicaid Other | Source: Home / Self Care | Attending: Family Medicine | Admitting: Family Medicine

## 2014-03-28 DIAGNOSIS — K625 Hemorrhage of anus and rectum: Secondary | ICD-10-CM

## 2014-03-28 LAB — POCT URINALYSIS DIP (DEVICE)
BILIRUBIN URINE: NEGATIVE
GLUCOSE, UA: NEGATIVE mg/dL
Hgb urine dipstick: NEGATIVE
Ketones, ur: NEGATIVE mg/dL
Leukocytes, UA: NEGATIVE
Nitrite: POSITIVE — AB
PH: 5.5 (ref 5.0–8.0)
Protein, ur: NEGATIVE mg/dL
Urobilinogen, UA: 0.2 mg/dL (ref 0.0–1.0)

## 2014-03-28 LAB — POCT I-STAT, CHEM 8
BUN: 9 mg/dL (ref 6–23)
Calcium, Ion: 1.21 mmol/L (ref 1.12–1.23)
Chloride: 105 mmol/L (ref 96–112)
Creatinine, Ser: 0.7 mg/dL (ref 0.50–1.10)
Glucose, Bld: 81 mg/dL (ref 70–99)
HEMATOCRIT: 41 % (ref 36.0–46.0)
Hemoglobin: 13.9 g/dL (ref 12.0–15.0)
POTASSIUM: 3.8 mmol/L (ref 3.5–5.1)
Sodium: 140 mmol/L (ref 135–145)
TCO2: 19 mmol/L (ref 0–100)

## 2014-03-28 LAB — POCT PREGNANCY, URINE: PREG TEST UR: NEGATIVE

## 2014-03-28 MED ORDER — HYDROCORTISONE ACETATE 25 MG RE SUPP: 25.0000 mg | Freq: Two times a day (BID) | RECTAL | Status: DC

## 2014-03-28 NOTE — ED Provider Notes (Addendum)
CSN: [id number]     Arrival date & time 03/28/14  1452 History   First MD Initiated Contact with Patient 03/28/14 1706     Chief Complaint  Patient presents with  . Abdominal Pain   (Consider location/radiation/quality/duration/timing/severity/associated sxs/prior Treatment) HPI Comments: Patient states she has had two soft bowel movements today that have resulted in BPBPR. She states she has seen both red blood on toilet tissue and red blood in water of toilet following each BM today. Denies pain with BMs or constipation. States each BM was preceded by abdominal cramping. Has not had similar issues in the past. Denies fatigue, dizziness, syncope, dyspnea or near syncope. Denies persistent abdominal pain, nausea, vomiting, diarrhea, fever, recent illness or injury. Only previous surgery is that of C-sect in 2014. States she contacted her PCP regarding this and issues of chronic headache and she is scheduled to be seen on 04/01/2014 at 1:45pm. Does not use ETOH or frequent aspirin or NSAIDs. States she was concerned about bleeding and thought she should be evaluated prior to 2/12 [facility] No recent travel. Has city water. No raw meats or seafood. No recent antibiotic use. No known ill contacts. LNMP: 03/21/2014.  PCP: [facility] @ [place] Hx of HTN, PCOS  The history is provided by the patient.    Past Medical History  Diagnosis Date  . Hypertension   . Ovarian cyst   . Anxiety   . Chlamydia   . Depression med made her navel itching and  made her sleepy so she quit taking them  . GERD (gastroesophageal reflux disease)    Past Surgical History  Procedure Laterality Date  . Breast biopsy      benign  . Cesarean section N/A 12/13/2012    Procedure: PRIMARY CESAREAN SECTION;  Surgeon: Kathreen CosierBernard A Marshall, MD;  Location: [facility];  Service: Obstetrics;  Laterality: N/A;   Family History  Problem Relation Age of Onset  . Diabetes Mother   . Heart disease Mother   .  Hypertension Mother   . Depression Mother   . Stroke Mother   . Arthritis Mother   . Stroke Father   . Asthma Sister   . Kidney disease Brother     genetic condition  . Diabetes Maternal Aunt   . Hypertension Maternal Aunt   . Asthma Maternal Aunt   . Epilepsy Maternal Aunt   . Hypertension Maternal Grandmother   . Dementia Maternal Grandmother   . Heart disease Maternal Grandmother   . Diabetes Paternal Grandmother   . Stroke Father    History  Substance Use Topics  . Smoking status: Current Every Day Smoker -- 0.25 packs/day for 5 years    Types: Cigarettes    Last Attempt to Quit: 07/19/2012  . Smokeless tobacco: Never Used  . Alcohol Use: No   OB History    Gravida Para Term Preterm AB TAB SAB Ectopic Multiple Living   1 1  1       0     Review of Systems  Constitutional: Negative.   HENT: Negative.   Eyes: Negative.   Respiratory: Negative.   Cardiovascular: Negative.   Gastrointestinal: Positive for abdominal pain, blood in stool and anal bleeding. Negative for nausea, vomiting, diarrhea, constipation, abdominal distention and rectal pain.  Genitourinary: Negative.   Musculoskeletal: Negative.   Skin: Negative.   Neurological: Negative.     Allergies  Macrobid  Home Medications   Prior to Admission medications   Medication  Sig Start Date End Date Taking? Authorizing Provider  lisinopril (PRINIVIL,ZESTRIL) 10 MG tablet Take 10 mg by mouth daily.   Yes Historical Provider, MD  metFORMIN (GLUCOPHAGE) 1000 MG tablet Take 1,000 mg by mouth 2 (two) times daily with a meal.   Yes Historical Provider, MD  hydrocortisone (ANUSOL-HC) 25 MG suppository Place 1 suppository (25 mg total) rectally 2 (two) times daily. 03/28/14   Mathis Fare Laylaa Guevarra, PA  loratadine (CLARITIN) 10 MG tablet Take 1 tablet (10 mg total) by mouth daily. 10/29/12   Brock Bad, MD  norethindrone (ORTHO MICRONOR) 0.35 MG tablet Take 1 tablet (0.35 mg total) by mouth daily. 12/16/12   Antionette Char, MD  oxyCODONE-acetaminophen (PERCOCET/ROXICET) 5-325 MG per tablet Take 1-2 tablets by mouth every 4 (four) hours as needed. 12/16/12   Antionette Char, MD   BP 147/77 mmHg  Pulse 88  Temp(Src) 97.9 F (36.6 C) (Oral)  Resp 16  SpO2 100%  LMP 03/21/2014 (Exact Date)  Breastfeeding? Unknown Physical Exam  Constitutional: She is oriented to person, place, and time. She appears well-developed and well-nourished. No distress.  +obese  HENT:  Head: Normocephalic and atraumatic.  Eyes: Conjunctivae are normal.  Neck: Normal range of motion. Neck supple.  Cardiovascular: Normal rate.   Pulmonary/Chest: Effort normal. No respiratory distress. She has no wheezes.  Abdominal: Soft. Bowel sounds are normal. She exhibits no distension. There is no tenderness.  Genitourinary: Rectal exam shows external hemorrhoid and tenderness. Rectal exam shows no internal hemorrhoid, no fissure, no mass and anal tone normal. Guaiac positive stool.  Reports mild rectal tenderness during exam. No visible rectal tear or active bleeding following digital exam. No palpable internal hemorrhoids. No active bleeding. Small external hemorrhoid. Stool is soft and light brown with small streaks of red mucous. Hemoccult positive.   Musculoskeletal: Normal range of motion.  Neurological: She is alert and oriented to person, place, and time.  Skin: Skin is warm and dry. No rash noted. No erythema. No pallor.  Psychiatric: She has a normal mood and affect. Her behavior is normal.  Nursing note and vitals reviewed.   ED Course  Procedures (including critical care time) Labs Review Labs Reviewed  POCT URINALYSIS DIP (DEVICE) - Abnormal; Notable for the following:    Nitrite POSITIVE (*)    All other components within normal limits  URINE CULTURE  POCT PREGNANCY, URINE  POCT I-STAT, CHEM 8    Imaging Review No results found.   MDM   1. Bright red rectal bleeding     24 y/o obese AA female with  painless rectal bleeding. Hemodynamically stable.  Istat 8 normal, including H/H of 13.9/41 UPT neg UA grossly normal. No findings to suggest colitis or enteritis. Will recommend careful observation at home and daily use of colace as prescribed. No ETOH, aspirin or NSAIDs. Follow up with PCP on 04/01/2014 if symptoms persist and for possible GI referral.  Patient voices clear understanding that if symptoms worsen, she is to go directly to nearest ER for re-evaluation.     Ria Clock, PA 03/28/14 1815  Ria Clock, [place] 03/28/14 1815   04/01/2014 Addendum: Contacted patient to make her aware of Urine C&S report. While she states she is asymptomatic with regard to typical indications of UTI (no frequency, abdominal pain, pelvic pain, back pain, flank pain, urgency, dysuria, hematuria) she is very concerned about bacteria in her urine and would very much like to be treated. Rx sent electronically  to patient's pharmacy of record for 7 day course of Keflex. Advised to follow up prn.   Ria Clock, PA 04/01/14 [phone]

## 2014-03-28 NOTE — Discharge Instructions (Signed)
Your labs are normal. I would advise that you not use alcohol or any NSAIDs or aspirin containing products for at least one week. Please use suppositories as prescribed and monitor your symptoms closely. If they persist, please discuss with your primary care provider at your appointment on 04/01/2014 so that they may refer you to a gastroenterologist. If symptoms become suddenly worse or severe, please report immediately to your nearest ER for re-evaluation.  Rectal Bleeding Rectal bleeding is when blood passes out of the anus. It is usually a sign that something is wrong. It may not be serious, but it should always be evaluated. Rectal bleeding may present as bright red blood or extremely dark stools. The color may range from dark red or maroon to black (like tar). It is important that the cause of rectal bleeding be identified so treatment can be started and the problem corrected. CAUSES   Hemorrhoids. These are enlarged (dilated) blood vessels or veins in the anal or rectal area.  Fistulas. Theseare abnormal, burrowing channels that usually run from inside the rectum to the skin around the anus. They can bleed.  Anal fissures. This is a tear in the tissue of the anus. Bleeding occurs with bowel movements.  Diverticulosis. This is a condition in which pockets or sacs project from the bowel wall. Occasionally, the sacs can bleed.  Diverticulitis. Thisis an infection involving diverticulosis of the colon.  Proctitis and colitis. These are conditions in which the rectum, colon, or both, can become inflamed and pitted (ulcerated).  Polyps and cancer. Polyps are non-cancerous (benign) growths in the colon that may bleed. Certain types of polyps turn into cancer.  Protrusion of the rectum. Part of the rectum can project from the anus and bleed.  Certain medicines.  Intestinal infections.  Blood vessel abnormalities. HOME CARE INSTRUCTIONS  Eat a high-fiber diet to keep your stool  soft.  Limit activity.  Drink enough fluids to keep your urine clear or pale yellow.  Warm baths may be useful to soothe rectal pain.  Follow up with your caregiver as directed. SEEK IMMEDIATE MEDICAL CARE IF:  You develop increased bleeding.  You have black or dark red stools.  You vomit blood or material that looks like coffee grounds.  You have abdominal pain or tenderness.  You have a fever.  You feel weak, nauseous, or you faint.  You have severe rectal pain or you are unable to have a bowel movement. MAKE SURE YOU:  Understand these instructions.  Will watch your condition.  Will get help right away if you are not doing well or get worse. Document Released: 07/27/2001 Document Revised: 04/29/2011 Document Reviewed: 07/22/2010 [facility] Patient Information 2015 [employer] [place]. This information is not intended to replace advice given to you by your health care provider. Make sure you discuss any questions you have with your health care provider.

## 2014-03-28 NOTE — ED Notes (Signed)
C/o HA off and on for some time. Had a normal/soft BM last night. Reportedly had a large bloody BM today w abdominal cramping

## 2014-03-29 LAB — OCCULT BLOOD, POC DEVICE: Fecal Occult Bld: POSITIVE — AB

## 2014-03-31 LAB — URINE CULTURE
Colony Count: >=100000
SPECIAL REQUESTS: NORMAL

## 2014-03-31 NOTE — ED Notes (Signed)
Urine culture: >100,000 colonies E. Coli-preliminary.  No antibiotic noted.  Lab shown to [facility] Press[facility] PA.  She said pt. was asymptomatic.  No further action needed. Vassie MoselleYork, Rashi Giuliani M 03/31/2014

## 2014-04-01 MED ORDER — CEPHALEXIN 500 MG PO CAPS: 500.0000 mg | ORAL_CAPSULE | Freq: Three times a day (TID) | ORAL | Status: DC

## 2014-05-10 ENCOUNTER — Encounter (HOSPITAL_COMMUNITY): Payer: Self-pay | Admitting: *Deleted

## 2014-05-10 ENCOUNTER — Inpatient Hospital Stay (HOSPITAL_COMMUNITY): Payer: Medicaid Other

## 2014-05-10 ENCOUNTER — Inpatient Hospital Stay (HOSPITAL_COMMUNITY)
Admission: AD | Admit: 2014-05-10 | Discharge: 2014-05-10 | Disposition: A | Discharge status: Home / Self Care | Payer: Medicaid Other | Source: Ambulatory Visit | Attending: Family Medicine | Admitting: Family Medicine

## 2014-05-10 DIAGNOSIS — N8320 Unspecified ovarian cysts: Secondary | ICD-10-CM | POA: Insufficient documentation

## 2014-05-10 DIAGNOSIS — R109 Unspecified abdominal pain: Secondary | ICD-10-CM

## 2014-05-10 DIAGNOSIS — N939 Abnormal uterine and vaginal bleeding, unspecified: Secondary | ICD-10-CM | POA: Diagnosis present

## 2014-05-10 DIAGNOSIS — I10 Essential (primary) hypertension: Secondary | ICD-10-CM | POA: Diagnosis not present

## 2014-05-10 DIAGNOSIS — K219 Gastro-esophageal reflux disease without esophagitis: Secondary | ICD-10-CM | POA: Diagnosis not present

## 2014-05-10 DIAGNOSIS — N920 Excessive and frequent menstruation with regular cycle: Secondary | ICD-10-CM | POA: Diagnosis not present

## 2014-05-10 DIAGNOSIS — F17210 Nicotine dependence, cigarettes, uncomplicated: Secondary | ICD-10-CM | POA: Diagnosis not present

## 2014-05-10 DIAGNOSIS — N390 Urinary tract infection, site not specified: Secondary | ICD-10-CM | POA: Insufficient documentation

## 2014-05-10 DIAGNOSIS — Z8742 Personal history of other diseases of the female genital tract: Secondary | ICD-10-CM

## 2014-05-10 LAB — URINALYSIS, ROUTINE W REFLEX MICROSCOPIC
BILIRUBIN URINE: NEGATIVE
Glucose, UA: NEGATIVE mg/dL
Ketones, ur: NEGATIVE mg/dL
Leukocytes, UA: NEGATIVE
Nitrite: POSITIVE — AB
PH: 6 (ref 5.0–8.0)
Protein, ur: NEGATIVE mg/dL
Specific Gravity, Urine: 1.02 (ref 1.005–1.030)
UROBILINOGEN UA: 0.2 mg/dL (ref 0.0–1.0)

## 2014-05-10 LAB — CBC
HCT: 36.5 % (ref 36.0–46.0)
Hemoglobin: 12 g/dL (ref 12.0–15.0)
MCH: 30.5 pg (ref 26.0–34.0)
MCHC: 32.9 g/dL (ref 30.0–36.0)
MCV: 92.6 fL (ref 78.0–100.0)
PLATELETS: 207 10*3/uL (ref 150–400)
RBC: 3.94 MIL/uL (ref 3.87–5.11)
RDW: 12.6 % (ref 11.5–15.5)
WBC: 6 10*3/uL (ref 4.0–10.5)

## 2014-05-10 LAB — WET PREP, GENITAL
CLUE CELLS WET PREP: NONE SEEN
TRICH WET PREP: NONE SEEN
Yeast Wet Prep HPF POC: NONE SEEN

## 2014-05-10 LAB — POCT PREGNANCY, URINE: PREG TEST UR: NEGATIVE

## 2014-05-10 LAB — URINE MICROSCOPIC-ADD ON

## 2014-05-10 LAB — TYPE AND SCREEN
ABO/RH(D): O POS
Antibody Screen: NEGATIVE

## 2014-05-10 MED ORDER — SULFAMETHOXAZOLE-TRIMETHOPRIM 800-160 MG PO TABS: 1.0000 | ORAL_TABLET | Freq: Two times a day (BID) | ORAL | Status: DC

## 2014-05-10 MED ORDER — MEDROXYPROGESTERONE ACETATE 10 MG PO TABS: 10.0000 mg | ORAL_TABLET | Freq: Every day | ORAL | Status: DC

## 2014-05-10 NOTE — MAU Note (Signed)
Pt did not take BP meds today  

## 2014-05-10 NOTE — MAU Note (Signed)
Period started yesterday, 2nd time in a month.  Became heavy today, took a nap, woke up covered in blood, passing clots. Felt dizzy earlier.

## 2014-05-10 NOTE — MAU Provider Note (Signed)
Chief Complaint: Vaginal Bleeding   First Provider Initiated Contact with Patient 05/10/14 1543     SUBJECTIVE HPI: Misty Walsh is a 24 y.o. [id number] female who presents to Maternity Admissions reporting heavy bleeding and low abdominal cramping since this afternoon. She took a nap and woke up "covered in blood". Had one period earlier this month and started second. Yesterday. Normally has monthly cycles with occasional irregular cycles and occasional heavy bleeding. Has not Required medication or transfusions in the past for menorrhagia. Reports Mild dizziness. Doesn't have a gynecologist.  Past Medical History  Diagnosis Date  . Hypertension   . Ovarian cyst   . Anxiety   . Chlamydia   . Depression med made her navel itching and  made her sleepy so she quit taking them  . GERD (gastroesophageal reflux disease)    OB History  Gravida Para Term Preterm AB SAB TAB Ectopic Multiple Living  # Outcome Date GA Lbr Len/2nd Weight Sex Delivery Anes PTL Lv  1 Preterm 12/13/12 [redacted]w[redacted]d / 00:59 0.76 kg (1 lb 10.8 oz) M CS-LTranv Spinal       Past Surgical History  Procedure Laterality Date  . Cesarean section N/A 12/13/2012    Procedure: PRIMARY CESAREAN SECTION;  Surgeon: Kathreen Cosier, MD;  Location: [facility];  Service: Obstetrics;  Laterality: N/A;  . Breast surgery     History   Social History  . Marital Status: Single    Spouse Name: N/A  . Number of Children: N/A  . Years of Education: N/A   Occupational History  . Not on file.   Social History Main Topics  . Smoking status: Current Every Day Smoker -- 0.25 packs/day for 5 years    Types: Cigarettes    Last Attempt to Quit: 07/19/2012  . Smokeless tobacco: Never Used  . Alcohol Use: No  . Drug Use: No  . Sexual Activity: Not Currently   Other Topics Concern  . Not on file   Social History Narrative   No current facility-administered medications on file prior to encounter.   Current Outpatient  Prescriptions on File Prior to Encounter  Medication Sig Dispense Refill  . cephALEXin (KEFLEX) 500 MG capsule Take 1 capsule (500 mg total) by mouth 3 (three) times daily. (Patient not taking: Reported on 05/10/2014) 21 capsule 0  . hydrocortisone (ANUSOL-HC) 25 MG suppository Place 1 suppository (25 mg total) rectally 2 (two) times daily. (Patient not taking: Reported on 05/10/2014) 12 suppository 0  . loratadine (CLARITIN) 10 MG tablet Take 1 tablet (10 mg total) by mouth daily. (Patient not taking: Reported on 05/10/2014) 30 tablet 11  . metFORMIN (GLUCOPHAGE) 1000 MG tablet Take 1,000 mg by mouth 2 (two) times daily with a meal.    . norethindrone (ORTHO MICRONOR) 0.35 MG tablet Take 1 tablet (0.35 mg total) by mouth daily. (Patient not taking: Reported on 05/10/2014) 28 tablet 11  . oxyCODONE-acetaminophen (PERCOCET/ROXICET) 5-325 MG per tablet Take 1-2 tablets by mouth every 4 (four) hours as needed. (Patient not taking: Reported on 05/10/2014) 30 tablet 0   Allergies  Allergen Reactions  . Macrobid [Nitrofurantoin Macrocrystal] Itching    Caused the patient to feel hot.    Review of Systems  Constitutional: Negative for fever, chills and malaise/fatigue.  Gastrointestinal: Positive for abdominal pain. Negative for nausea, vomiting, diarrhea and constipation.  Genitourinary: Positive for frequency. Negative for dysuria, urgency, hematuria and flank pain.  Neurological: Positive for dizziness. Negative for loss of consciousness and weakness.  Endo/Heme/Allergies: Does not bruise/bleed easily.   OBJECTIVE Blood pressure 157/99, pulse 78, temperature 98.4 F (36.9 C), temperature source Oral, resp. rate 18, height 5' 3.5" (1.613 m), weight 131.09 kg (289 lb), last menstrual period 05/09/2014, unknown if currently breastfeeding.  Patient Vitals for the past 24 hrs:  BP Temp Temp src Pulse Resp Height Weight  05/10/14 1816 157/99 mmHg - - 78 18 - -  05/10/14 1435 153/86 mmHg 98.4 F (36.9  C) Oral 78 18 5' 3.5" (1.613 m) 131.09 kg (289 lb)    GENERAL: Well-developed, well-nourished, morbidly obese female in no acute distress. No pallor. HEART: normal rate RESP: normal effort GI: Abdomen soft, non-tender. Positive bowel sounds 4. MS: Nontender, no edema NEURO: Alert and oriented GU: No CVA tenderness.  SPECULUM EXAM: NEFG, small amount of dark red blood noted, cervix clean, but incompletely visualized due to patient's intolerance of exam. No polyp seen.  BIMANUAL: cervix closed; uterus normal size, mild left adnexal tenderness. No mass. No right adnexal tenderness or mass. No cervical motion tenderness.  LAB RESULTS Results for orders placed or performed during the hospital encounter of 05/10/14 (from the past 24 hour(s))  Urinalysis, Routine w reflex microscopic     Status: Abnormal   Collection Time: 05/10/14  2:20 PM  Result Value Ref Range   Color, Urine YELLOW YELLOW   APPearance CLEAR CLEAR   Specific Gravity, Urine 1.020 1.005 - 1.030   pH 6.0 5.0 - 8.0   Glucose, UA NEGATIVE NEGATIVE mg/dL   Hgb urine dipstick LARGE (A) NEGATIVE   Bilirubin Urine NEGATIVE NEGATIVE   Ketones, ur NEGATIVE NEGATIVE mg/dL   Protein, ur NEGATIVE NEGATIVE mg/dL   Urobilinogen, UA 0.2 0.0 - 1.0 mg/dL   Nitrite POSITIVE (A) NEGATIVE   Leukocytes, UA NEGATIVE NEGATIVE  Urine microscopic-add on     Status: Abnormal   Collection Time: 05/10/14  2:20 PM  Result Value Ref Range   RBC / HPF 7-10 <3 RBC/hpf   Bacteria, UA MANY (A) RARE   Urine-Other MUCOUS PRESENT   Pregnancy, urine POC     Status: None   Collection Time: 05/10/14  3:28 PM  Result Value Ref Range   Preg Test, Ur NEGATIVE NEGATIVE  CBC     Status: None   Collection Time: 05/10/14  4:05 PM  Result Value Ref Range   WBC 6.0 4.0 - 10.5 K/uL   RBC 3.94 3.87 - 5.11 MIL/uL   Hemoglobin 12.0 12.0 - 15.0 g/dL   HCT 40.936.5 81.136.0 - 91.446.0 %   MCV 92.6 78.0 - 100.0 fL   MCH 30.5 26.0 - 34.0 pg   MCHC 32.9 30.0 - 36.0 g/dL    RDW 78.212.6 95.611.5 - 21.315.5 %   Platelets 207 150 - 400 K/uL  Type and screen     Status: None   Collection Time: 05/10/14  4:05 PM  Result Value Ref Range   ABO/RH(D) O POS    Antibody Screen NEG    Sample Expiration 05/13/2014     IMAGING [place] Transvaginal Non-ob  05/10/2014   CLINICAL DATA:  Heavy vaginal bleeding with clotting. Cramping. Menorrhagia.  EXAM: TRANSABDOMINAL AND TRANSVAGINAL ULTRASOUND OF PELVIS  TECHNIQUE: Both transabdominal and transvaginal ultrasound examinations of the pelvis were performed. Transabdominal technique was performed for global imaging of the pelvis including uterus, ovaries, adnexal regions, and pelvic cul-de-sac. It was necessary to proceed with endovaginal exam following the transabdominal  exam to visualize the uterus and endometrium.  COMPARISON:  01/26/2012  FINDINGS: Uterus  Measurements: 7.8 by 3.6 by 4.7 cm. No fibroids or other mass visualized.  Endometrium  Thickness: 1.2 cm.  No focal abnormality visualized.  Right ovary  Measurements: 4.7 by 2.5 by 2.5 cm. Normal appearance/no adnexal mass.  Left ovary  Measurements: 2.3 by 1.4 by 1.6 cm. Simple appearing cyst, approximately 1.3 cm in diameter  Other findings  No free fluid.  IMPRESSION: 1. Small cyst, left ovary. Otherwise, no significant abnormalities are observed.   Electronically Signed   By: Gaylyn Rong M.D.   On: 05/10/2014 17:39   US Pelvis Complete  05/10/2014   CLINICAL DATA:  Heavy vaginal bleeding with clotting. Cramping. Menorrhagia.  EXAM: TRANSABDOMINAL AND TRANSVAGINAL ULTRASOUND OF PELVIS  TECHNIQUE: Both transabdominal and transvaginal ultrasound examinations of the pelvis were performed. Transabdominal technique was performed for global imaging of the pelvis including uterus, ovaries, adnexal regions, and pelvic cul-de-sac. It was necessary to proceed with endovaginal exam following the transabdominal exam to visualize the uterus and endometrium.  COMPARISON:  01/26/2012  FINDINGS: Uterus   Measurements: 7.8 by 3.6 by 4.7 cm. No fibroids or other mass visualized.  Endometrium  Thickness: 1.2 cm.  No focal abnormality visualized.  Right ovary  Measurements: 4.7 by 2.5 by 2.5 cm. Normal appearance/no adnexal mass.  Left ovary  Measurements: 2.3 by 1.4 by 1.6 cm. Simple appearing cyst, approximately 1.3 cm in diameter  Other findings  No free fluid.  IMPRESSION: 1. Small cyst, left ovary. Otherwise, no significant abnormalities are observed.   Electronically Signed   By: Gaylyn Rong M.D.   On: 05/10/2014 17:39    MAU COURSE Small amount of bleeding while in maternity admissions.  ASSESSMENT 1. UTI (lower urinary tract infection)   2. Menorrhagia   3. History of ovarian cyst   4. Abdominal pain in female     PLAN Discharge home in stable condition.     Follow-up Information    Follow up with Gynecologist.   Why:  For routine gynecology care for management of menstrual irregularities      Follow up with [facility] [place] [facility].   Why:  As needed in emergencies   Contact information:   [address] [id number] mc [place] [address] [phone]    Gonorrhea/Chlamydia cultures pending. Bleeding precautions.   Medication List    STOP taking these medications        cephALEXin 500 MG capsule  Commonly known as:  KEFLEX     hydrocortisone 25 MG suppository  Commonly known as:  ANUSOL-HC     loratadine 10 MG tablet  Commonly known as:  CLARITIN     metFORMIN 1000 MG tablet  Commonly known as:  GLUCOPHAGE     norethindrone 0.35 MG tablet  Commonly known as:  ORTHO MICRONOR     oxyCODONE-acetaminophen 5-325 MG per tablet  Commonly known as:  PERCOCET/ROXICET      TAKE these medications        atorvastatin 20 MG tablet  Commonly known as:  LIPITOR  Take 20 mg by mouth daily at 6 PM.     glimepiride 4 MG tablet  Commonly known as:  AMARYL  Take 4 mg by mouth daily with breakfast.      losartan-hydrochlorothiazide 50-12.5 MG per tablet  Commonly known as:  HYZAAR  Take 1 tablet by mouth daily.     medroxyPROGESTERone 10 MG tablet  Commonly known as:  PROVERA  Take 1 tablet (10 mg total) by mouth daily.     PARoxetine 20 MG tablet  Commonly known as:  PAXIL  Take 2,200 mg by mouth daily.     sulfamethoxazole-trimethoprim 800-160 MG per tablet  Commonly known as:  BACTRIM DS,SEPTRA DS  Take 1 tablet by mouth 2 (two) times daily.       Brock, CNM 05/10/2014  6:20 PM

## 2014-05-10 NOTE — MAU Note (Signed)
Urine in lab 

## 2014-05-10 NOTE — Discharge Instructions (Signed)
Abnormal Uterine Bleeding °Abnormal uterine bleeding can affect women at various stages in life, including teenagers, women in their reproductive years, pregnant women, and women who have reached menopause. Several kinds of uterine bleeding are considered abnormal, including: °· Bleeding or spotting between periods.   °· Bleeding after sexual intercourse.   °· Bleeding that is heavier or more than normal.   °· Periods that last longer than usual. °· Bleeding after menopause.   °Many cases of abnormal uterine bleeding are minor and simple to treat, while others are more serious. Any type of abnormal bleeding should be evaluated by your health care provider. Treatment will depend on the cause of the bleeding. °HOME CARE INSTRUCTIONS °Monitor your condition for any changes. The following actions may help to alleviate any discomfort you are experiencing: °· Avoid the use of tampons and douches as directed by your health care provider. °· Change your pads frequently. °You should get regular pelvic exams and Pap tests. Keep all follow-up appointments for diagnostic tests as directed by your health care provider.  °SEEK MEDICAL CARE IF:  °· Your bleeding lasts more than 1 week.   °· You feel dizzy at times.   °SEEK IMMEDIATE MEDICAL CARE IF:  °· You pass out.   °· You are changing pads every 15 to 30 minutes.   °· You have abdominal pain. °· You have a fever.   °· You become sweaty or weak.   °· You are passing large blood clots from the vagina.   °· You start to feel nauseous and vomit. °MAKE SURE YOU:  °· Understand these instructions. °· Will watch your condition. °· Will get help right away if you are not doing well or get worse. °Document Released: 02/04/2005 Document Revised: 02/09/2013 Document Reviewed: 09/03/2012 °ExitCare® Patient Information ©2015 [employer]. This information is not intended to replace advice given to you by your health care provider. Make sure you discuss any questions you have with your  health care provider. ° °Urinary Tract Infection °Urinary tract infections (UTIs) can develop anywhere along your urinary tract. Your urinary tract is your body's drainage system for removing wastes and extra water. Your urinary tract includes two kidneys, two ureters, a bladder, and a urethra. Your kidneys are a pair of bean-shaped organs. Each kidney is about the size of your fist. They are located below your ribs, one on each side of your spine. °CAUSES °Infections are caused by microbes, which are microscopic organisms, including fungi, viruses, and bacteria. These organisms are so small that they can only be seen through a microscope. Bacteria are the microbes that most commonly cause UTIs. °SYMPTOMS  °Symptoms of UTIs may vary by age and gender of the patient and by the location of the infection. Symptoms in young women typically include a frequent and intense urge to urinate and a painful, burning feeling in the bladder or urethra during urination. Older women and men are more likely to be tired, shaky, and weak and have muscle aches and abdominal pain. A fever may mean the infection is in your kidneys. Other symptoms of a kidney infection include pain in your back or sides below the ribs, nausea, and vomiting. °DIAGNOSIS °To diagnose a UTI, your caregiver will ask you about your symptoms. Your caregiver also will ask to provide a urine sample. The urine sample will be tested for bacteria and white blood cells. White blood cells are made by your body to help fight infection. °TREATMENT  °Typically, UTIs can be treated with medication. Because most UTIs are caused by a bacterial infection,   they usually can be treated with the use of antibiotics. The choice of antibiotic and length of treatment depend on your symptoms and the type of bacteria causing your infection. °HOME CARE INSTRUCTIONS °· If you were prescribed antibiotics, take them exactly as your caregiver instructs you. Finish the medication even if you  feel better after you have only taken some of the medication. °· Drink enough water and fluids to keep your urine clear or pale yellow. °· Avoid caffeine, tea, and carbonated beverages. They tend to irritate your bladder. °· Empty your bladder often. Avoid holding urine for long periods of time. °· Empty your bladder before and after sexual intercourse. °· After a bowel movement, women should cleanse from front to back. Use each tissue only once. °SEEK MEDICAL CARE IF:  °· You have back pain. °· You develop a fever. °· Your symptoms do not begin to resolve within 3 days. °SEEK IMMEDIATE MEDICAL CARE IF:  °· You have severe back pain or lower abdominal pain. °· You develop chills. °· You have nausea or vomiting. °· You have continued burning or discomfort with urination. °MAKE SURE YOU:  °· Understand these instructions. °· Will watch your condition. °· Will get help right away if you are not doing well or get worse. °Document Released: 11/14/2004 Document Revised: 08/06/2011 Document Reviewed: 03/15/2011 °ExitCare® Patient Information ©2015 [employer]. This information is not intended to replace advice given to you by your health care provider. Make sure you discuss any questions you have with your health care provider. ° °

## 2014-05-10 NOTE — MAU Note (Signed)
Pt states she has had some vaginal bleeding that started yesterday. Pt states bleeding is abnormal, pt states she is having some cramping as well

## 2014-05-11 LAB — GC/CHLAMYDIA PROBE AMP (~~LOC~~) NOT AT ARMC
CHLAMYDIA, DNA PROBE: NEGATIVE
Neisseria Gonorrhea: NEGATIVE

## 2014-05-11 LAB — HIV ANTIBODY (ROUTINE TESTING W REFLEX): HIV Screen 4th Generation wRfx: NONREACTIVE

## 2014-09-10 IMAGING — US US RENAL
1 series · 14 of 25 positions shown · non-contrast
Comparison: None.

CLINICAL DATA: Pyelonephritis, 22 weeks pregnant

EXAM:
RENAL/URINARY TRACT ULTRASOUND COMPLETE

[Series 1: us renal · 14 of 37 slices shown]
[im 1/37]
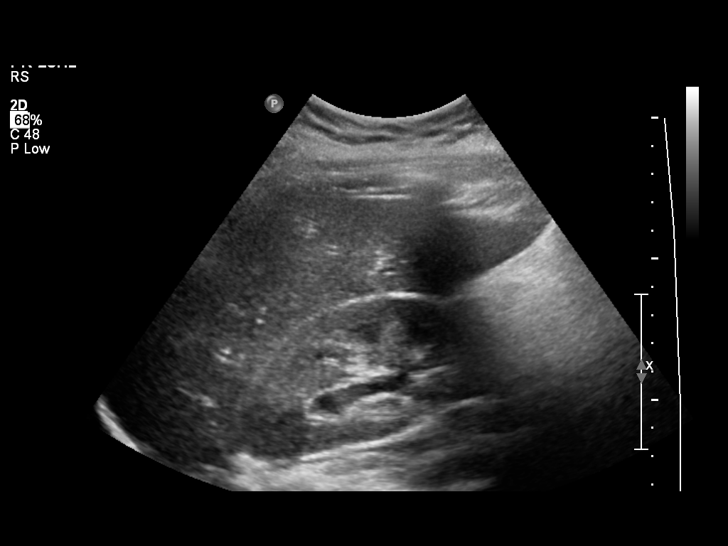
[im 4/37]
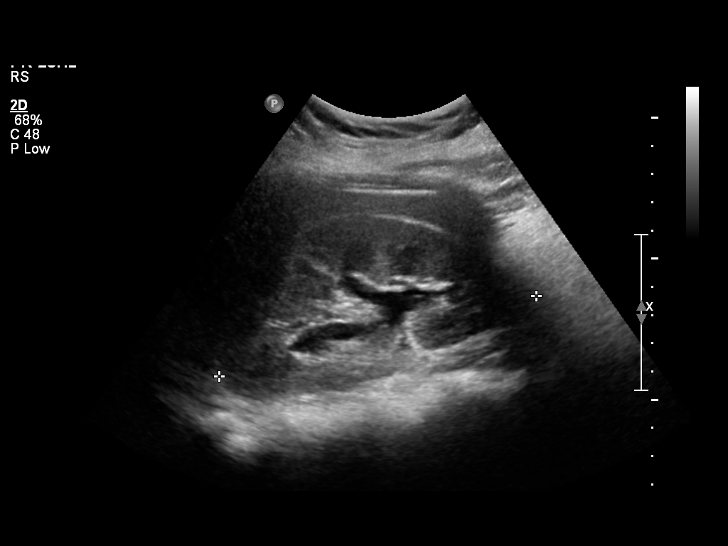
[im 7/37]
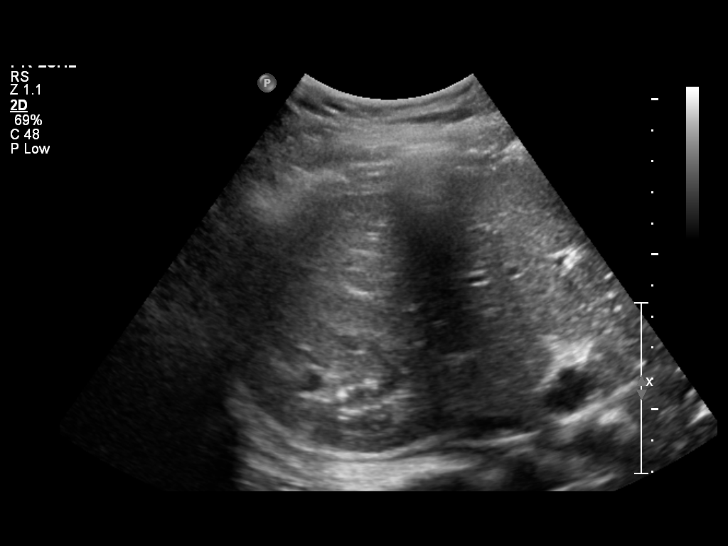
[im 10/37]
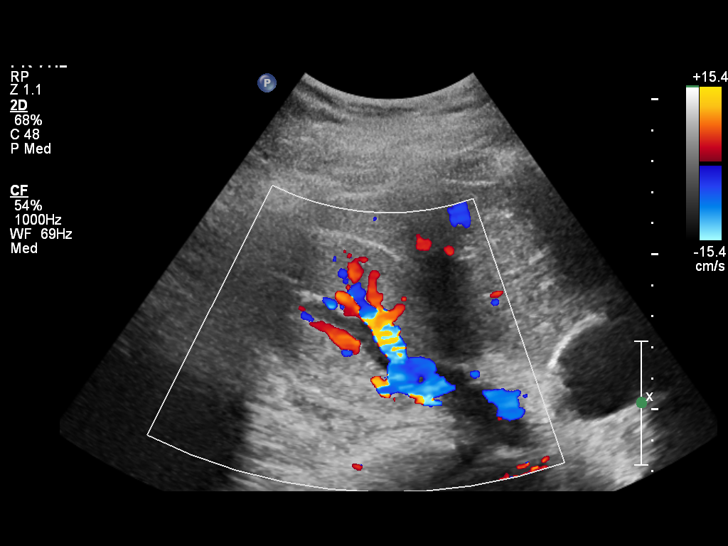
[im 13/37]
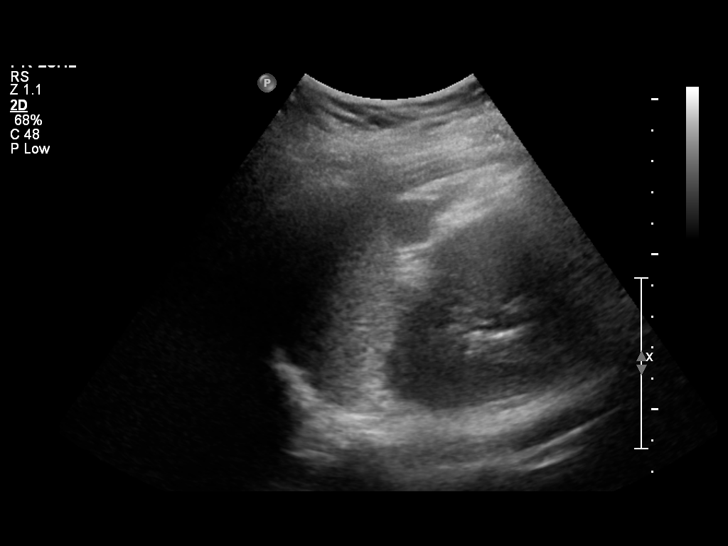
[im 14/37]
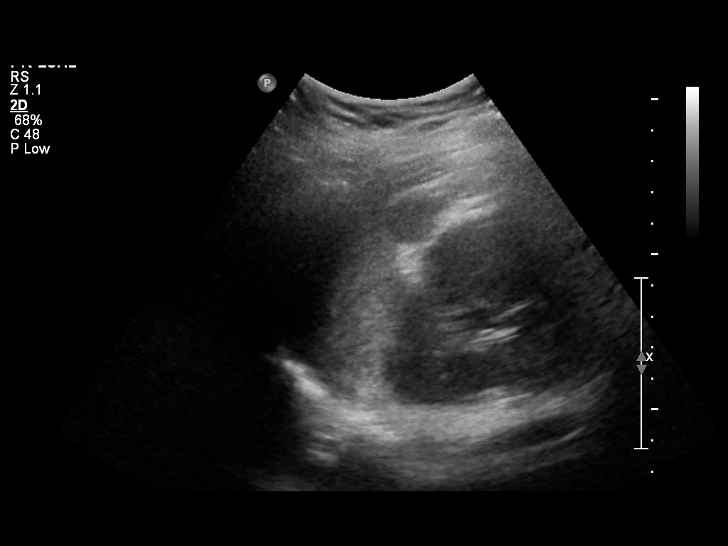
[im 17/37]
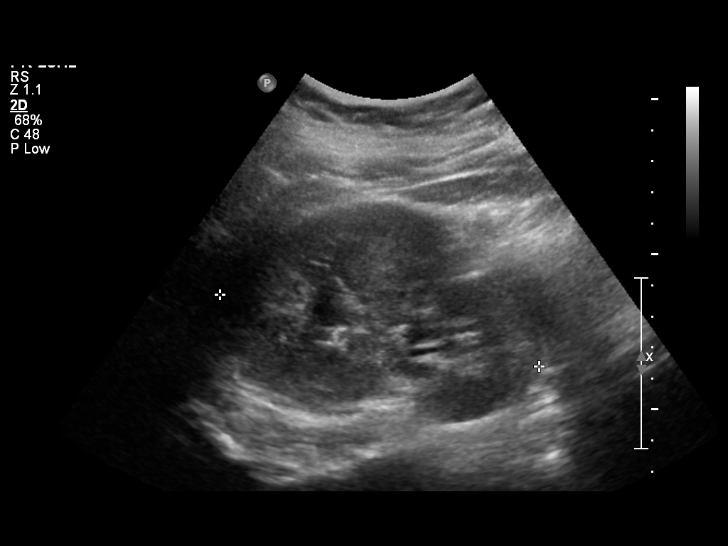
[im 20/37]
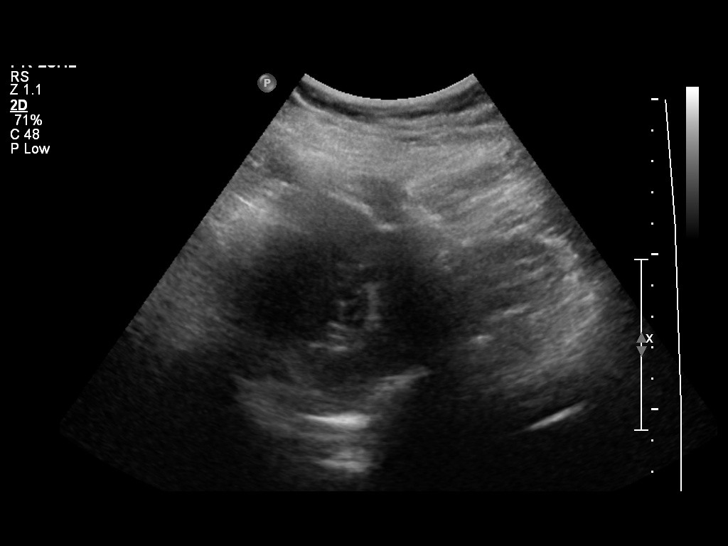
[im 23/37]
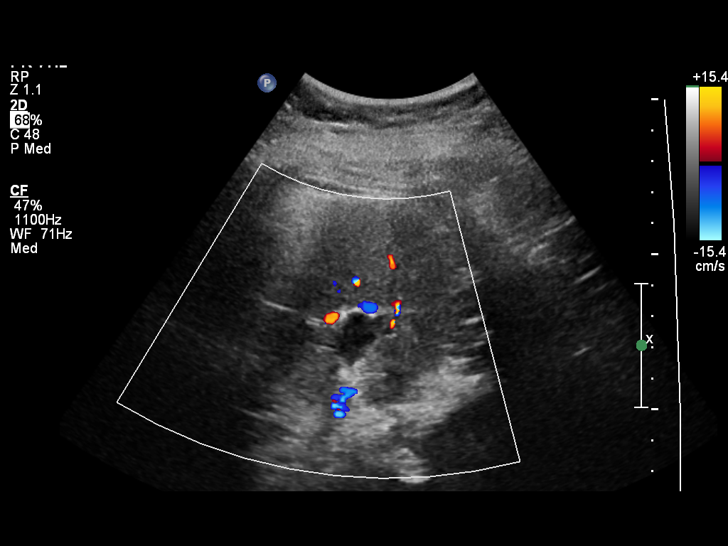
[im 25/37]
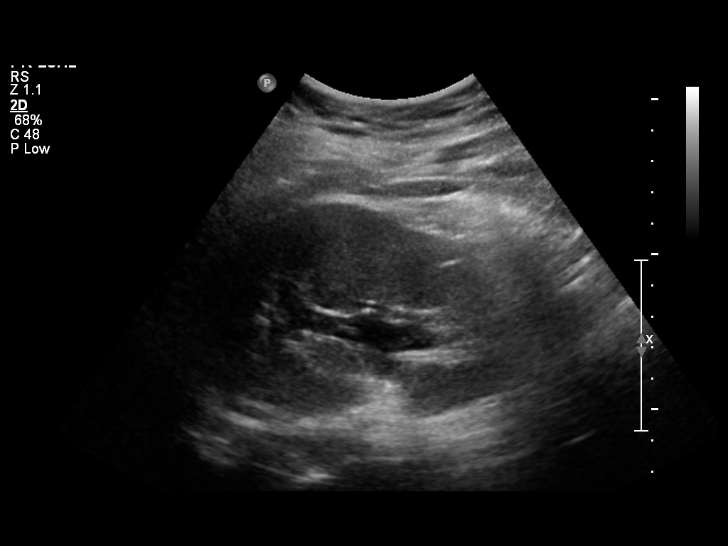
[im 28/37]
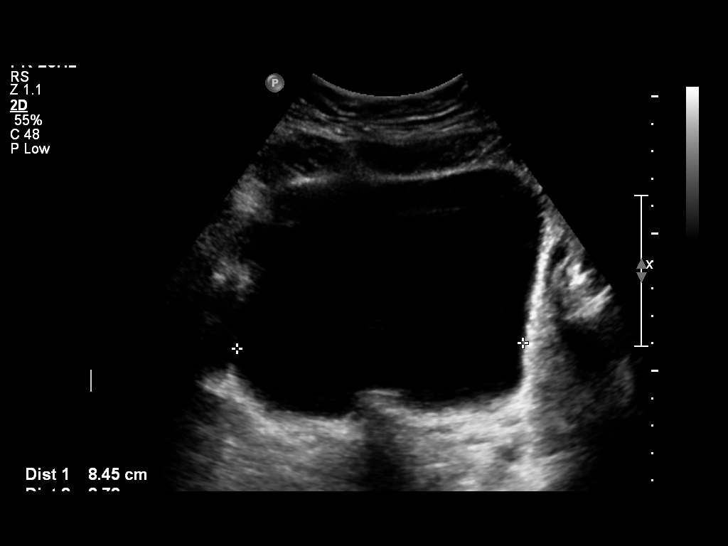
[im 31/37]
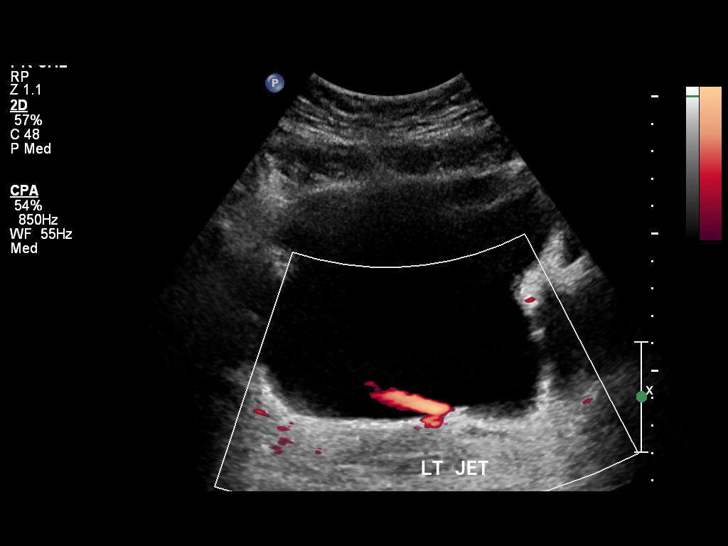
[im 34/37]
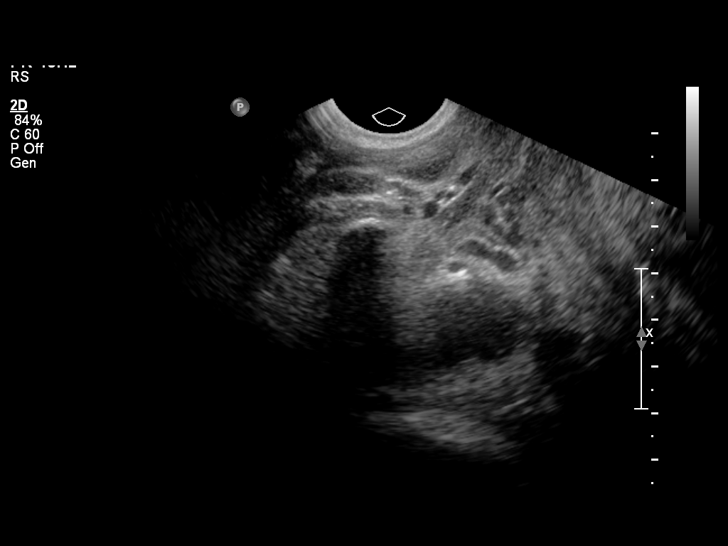
[im 37/37]
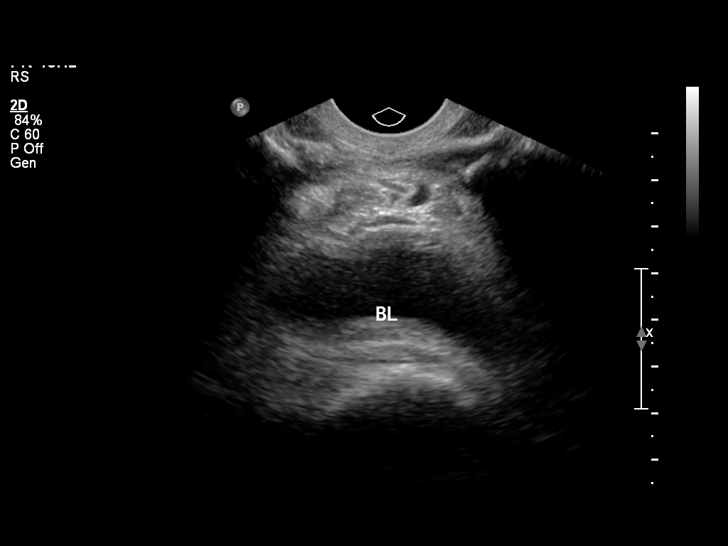

[14 of 25 positions shown; findings below may reference images not displayed]

FINDINGS: Right Kidney

Length: 11.6 cm. Mild right hydronephrosis is noted.

Left Kidney

Length: 10.5 cm. Mild left hydronephrosis is noted.

Renal cortical thickness is preserved bilaterally without focal
abnormality.

Bladder: Appears normal for degree of bladder distention. Complete
emptying after voiding, no postvoid residual.
IMPRESSION: Mild bilateral hydronephrosis. If the patient specifically has flank
pain and there is strong clinical concern for a ureteral calculus,
further evaluation with limited pelvic transvaginal ultrasound to
evaluate the distal ureters could be performed. Alternatively, MR
urography without contrast may be helpful for better evaluation of
the collecting systems. The degree of mild bilateral hydronephrosis
is most likely in keeping with the patient's gravid state.

## 2014-09-15 IMAGING — US US OB LIMITED
1 series · 13 of 19 positions shown · non-contrast
Comparison: none

[Series 1: us ob limited · 19 acquisitions, 13 frames shown]
[im 1/19]
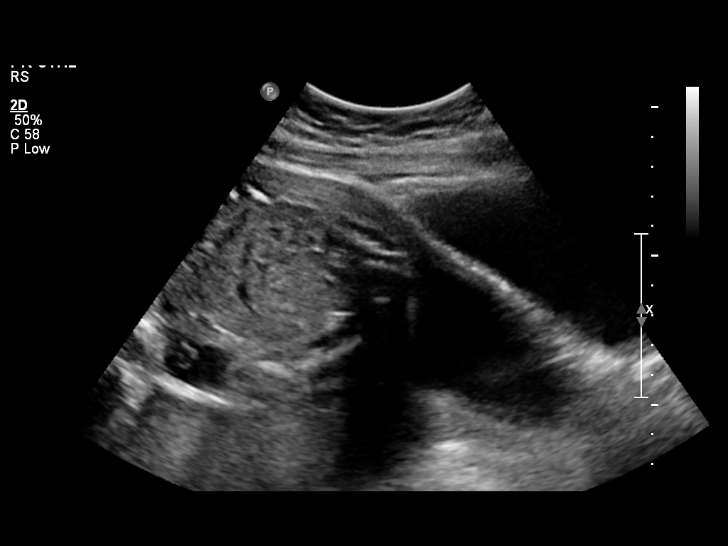
[im 3/19]
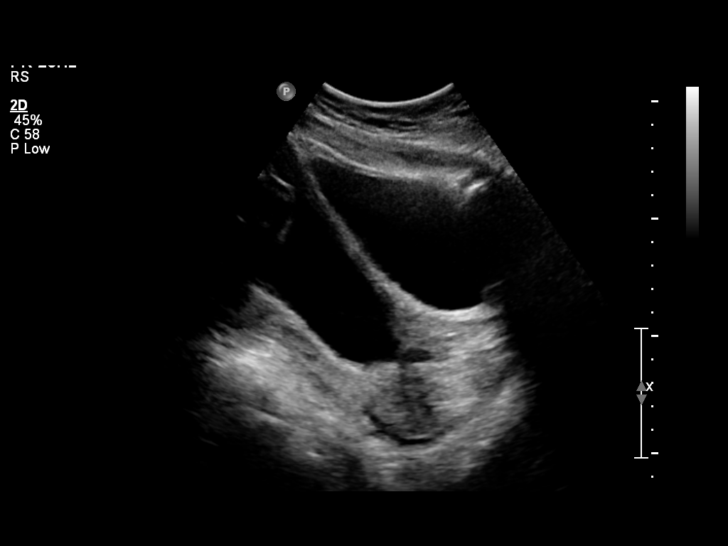
[im 4/19]
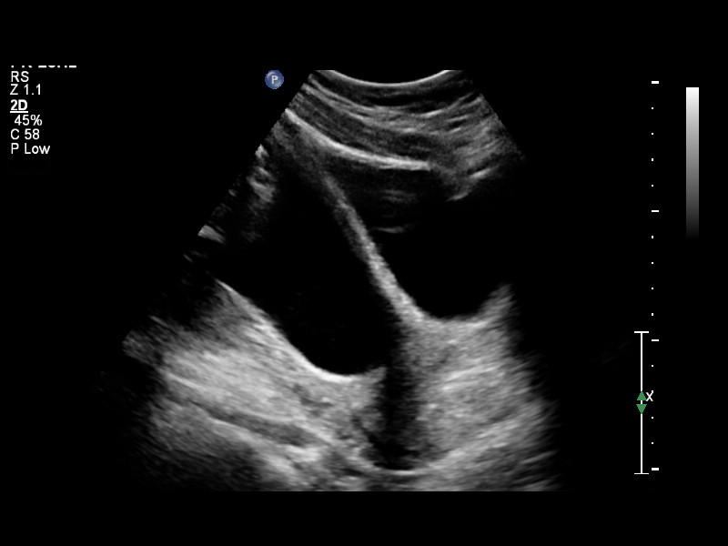
[im 6/19]
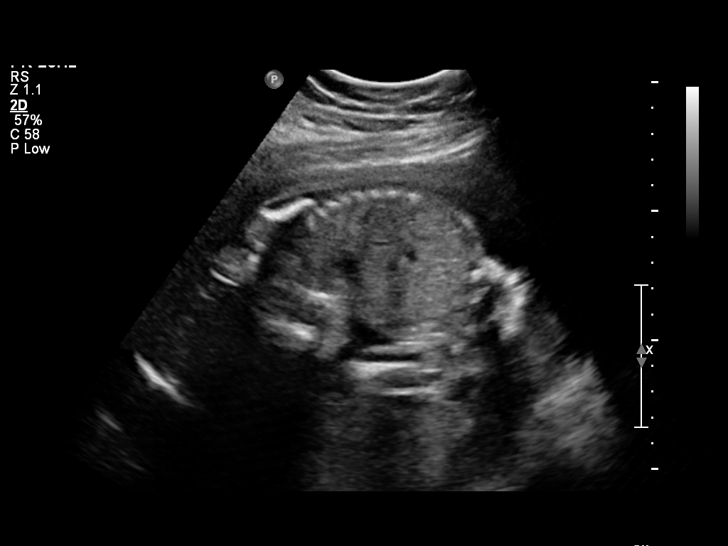
[im 7/19]
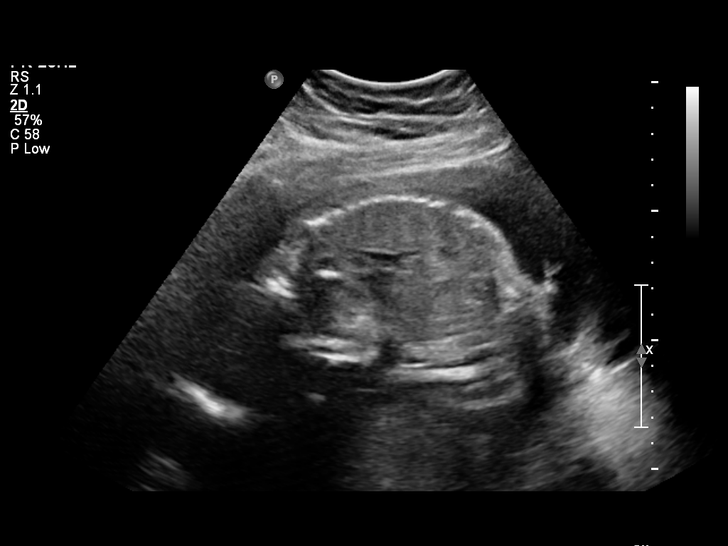
[im 9/19]
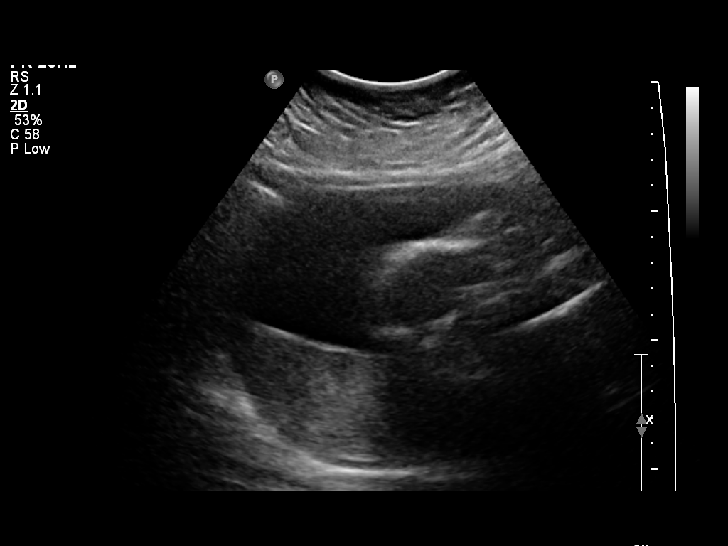
[im 10/19]
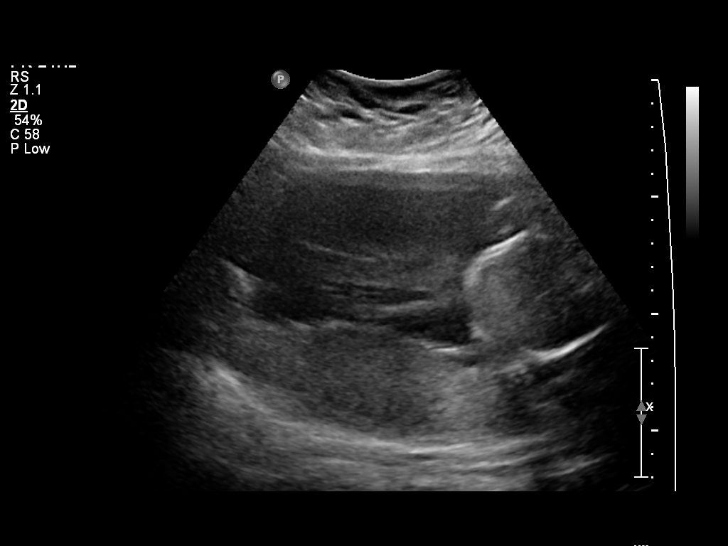
[im 11/19]
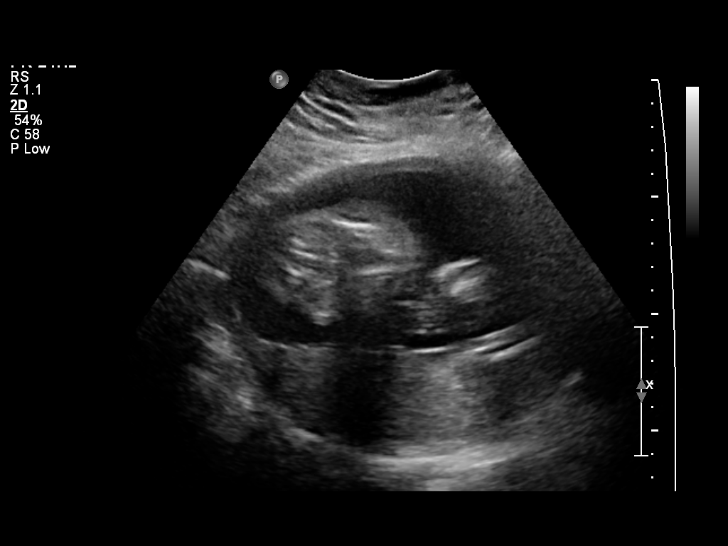
[im 13/19]
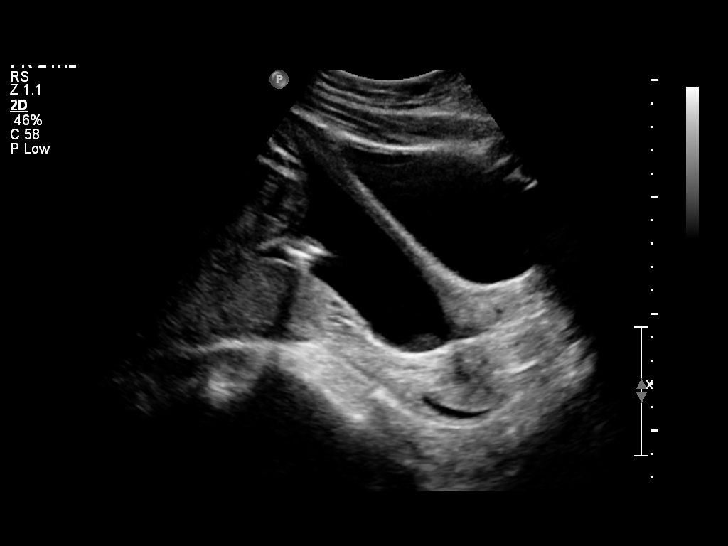
[im 14/19]
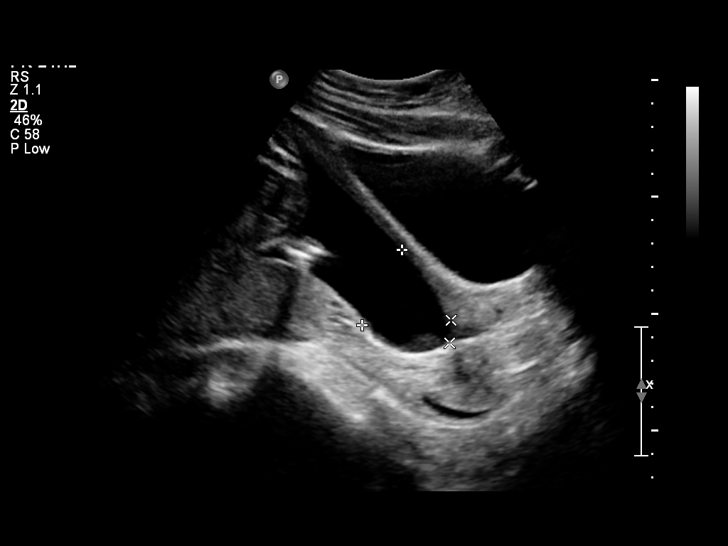
[im 16/19]
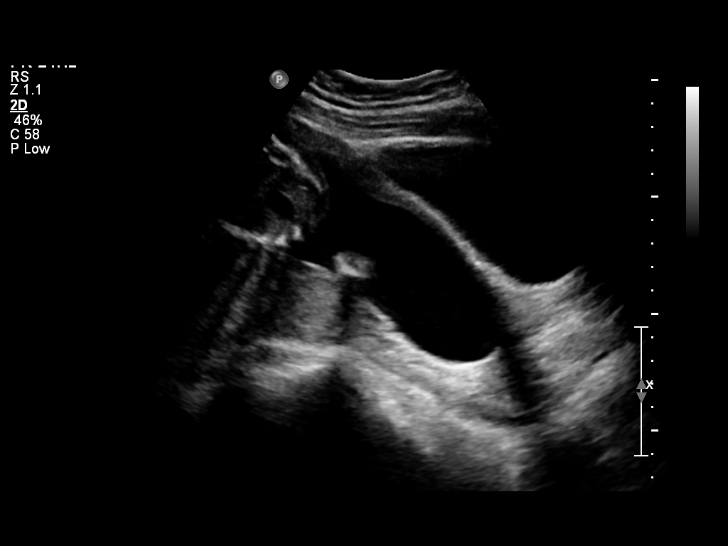
[im 17/19]
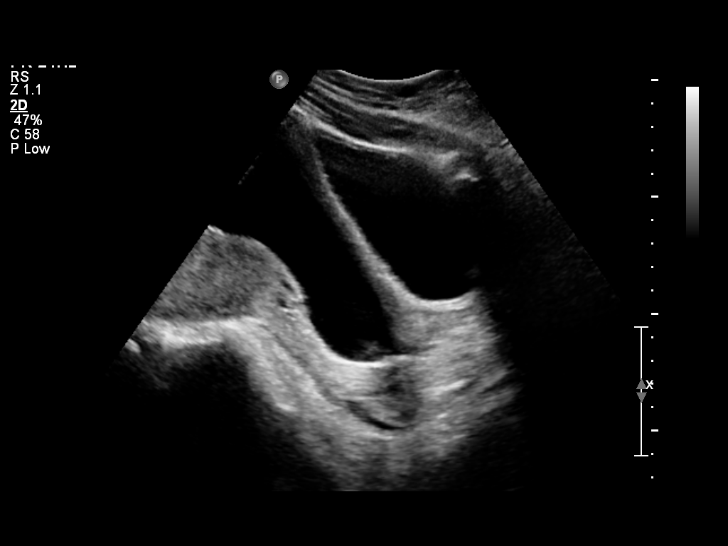
[im 19/19]
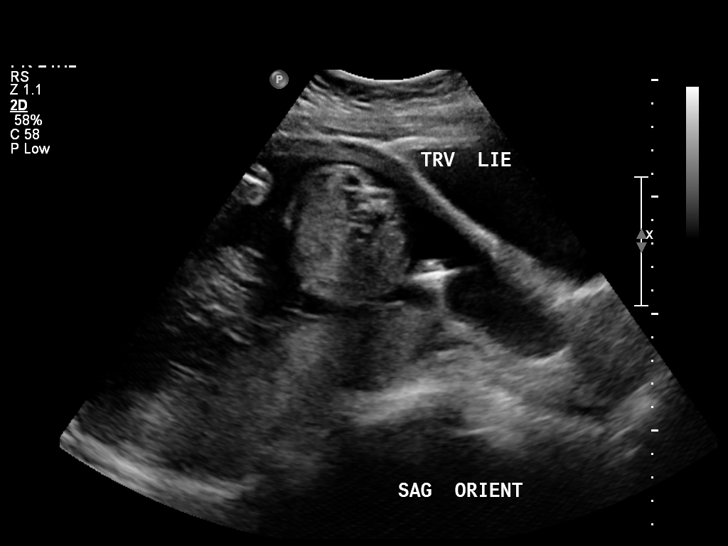

[13 of 19 positions shown; findings below may reference images not displayed]

OBSTETRICS REPORT
                      (Signed Final 11/29/2012 [DATE])

Service(s) Provided

 [HOSPITAL]                                         76815.0
Indications

 Vaginal bleeding, unknown etiology
 Assess cervical length
 Hypertension - Chronic/Pre-existing
 Maternal urinary tract infection
Fetal Evaluation

 Num Of Fetuses:    1
 Fetal Heart Rate:  155                          bpm
 Cardiac Activity:  Observed
 Presentation:      Transverse, head to
                    maternal right
 Placenta:          Posterior, above cervical
                    os

 Amniotic Fluid
 AFI FV:      Subjectively within normal limits
                                              Larg Pckt:    5.5  cm
Gestational Age

 Clinical EDD:  22w 3d                                        EDD:    03/31/13
 Best:          22w 3d     Det. By:  Clinical EDD             EDD:    03/31/13
Cervix Uterus Adnexa

 Cervical Length:    0.6       cm

 Cervix:       Funneling of internal os noted.
Impression

 SIUP at 22+3 weeks
 Transverse presentation
 Normal amniotic fluid volume
 Posterior placenta
 TA views of cervix: wide U-shaped funneling; distal closed
 portion measured 
 6 mms; small amount of fluid in posterior
 vaginal fornix; significant change from the EV US performed
 on [DATE]

 Sonographer was concerned that the pt may have ruptured
 membranes. Can obtain better images of cervix with
 endovaginal US if rupture is ruled-out and if clinically
 indicated.
Recommendations

 As above

## 2014-11-03 ENCOUNTER — Encounter: Payer: Medicaid Other | Admitting: Obstetrics & Gynecology

## 2014-11-24 ENCOUNTER — Encounter: Payer: Self-pay | Admitting: *Deleted

## 2014-11-24 ENCOUNTER — Telehealth: Payer: Self-pay | Admitting: *Deleted

## 2014-11-24 ENCOUNTER — Encounter: Payer: Medicaid Other | Admitting: Obstetrics & Gynecology

## 2014-11-24 NOTE — Telephone Encounter (Signed)
Misty Walsh missed a scheduled appointment for a colposcopy. Called [facility] and left a message she missed an appointment and is important for her to call and reschedule. Will send letter.

## 2015-01-04 ENCOUNTER — Encounter: Payer: Self-pay | Admitting: Obstetrics & Gynecology

## 2015-01-04 ENCOUNTER — Ambulatory Visit (INDEPENDENT_AMBULATORY_CARE_PROVIDER_SITE_OTHER): Payer: Medicaid Other | Admitting: Obstetrics & Gynecology

## 2015-01-04 ENCOUNTER — Other Ambulatory Visit (HOSPITAL_COMMUNITY)
Admission: RE | Admit: 2015-01-04 | Discharge: 2015-01-04 | Disposition: A | Discharge status: Home / Self Care | Payer: Medicaid Other | Source: Ambulatory Visit | Attending: Obstetrics & Gynecology | Admitting: Obstetrics & Gynecology

## 2015-01-04 VITALS — BP 144/88 | HR 84 | Temp 97.7°F | Ht 63.0 in | Wt 283.0 lb

## 2015-01-04 DIAGNOSIS — R87619 Unspecified abnormal cytological findings in specimens from cervix uteri: Secondary | ICD-10-CM | POA: Insufficient documentation

## 2015-01-04 DIAGNOSIS — R87610 Atypical squamous cells of undetermined significance on cytologic smear of cervix (ASC-US): Secondary | ICD-10-CM

## 2015-01-04 DIAGNOSIS — Z3202 Encounter for pregnancy test, result negative: Secondary | ICD-10-CM

## 2015-01-04 DIAGNOSIS — R87810 Cervical high risk human papillomavirus (HPV) DNA test positive: Secondary | ICD-10-CM

## 2015-01-04 LAB — POCT PREGNANCY, URINE: PREG TEST UR: NEGATIVE

## 2015-01-04 NOTE — Progress Notes (Signed)
   Subjective:    Patient ID: Misty Walsh, female    DOB: 1990/08/19, 24 y.o.   MRN: [id number]  HPI 24 yo lady here for a colpo due to ASCUS pap with + HR HPV.   Review of Systems     Objective:   Physical Exam  WNWHBFNAD UPT negative, consent signed, time out done Cervix prepped with acetic acid. Transformation zone seen in its entirety. Colpo adequate. 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 She tolerated the procedure well.        Assessment & Plan:  Pap ASCUS, colpo c/w LGSIL If ECC negative, then pap in a year

## 2015-01-05 DIAGNOSIS — R87619 Unspecified abnormal cytological findings in specimens from cervix uteri: Secondary | ICD-10-CM | POA: Diagnosis not present

## 2015-01-06 ENCOUNTER — Telehealth: Payer: Self-pay | Admitting: General Practice

## 2015-01-06 NOTE — Telephone Encounter (Signed)
Patient called and left message stating she was here Tuesday and had lab work. Patient states she still has not had a period and wants to know if her results are related. Called patient and discussed that the colposcopy wouldn't cause her periods to be late and asked if she could possible be pregnant. Patient states she is unsure but her test was negative the other day. Told patient there could be several reasons her period would be late but it wouldn't be related to the procedure. Patient verbalized understanding and had no other questions

## 2015-01-09 ENCOUNTER — Telehealth: Payer: Self-pay | Admitting: *Deleted

## 2015-01-09 NOTE — Telephone Encounter (Signed)
Per Dr. 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  I called pt and informed her of Colpo results and that another procedure may be needed due to the abnormal results. Someone will call her with the appt details. She will discuss the results further with the doctor and determine if another procedure is necessary which may be performed on that day. Pt voiced understanding and stated that she needs a morning appt.

## 2015-02-03 ENCOUNTER — Ambulatory Visit (INDEPENDENT_AMBULATORY_CARE_PROVIDER_SITE_OTHER): Payer: Medicaid Other | Admitting: Obstetrics & Gynecology

## 2015-02-03 ENCOUNTER — Other Ambulatory Visit (HOSPITAL_COMMUNITY)
Admission: RE | Admit: 2015-02-03 | Discharge: 2015-02-03 | Disposition: A | Discharge status: Home / Self Care | Payer: Medicaid Other | Source: Ambulatory Visit | Attending: Obstetrics & Gynecology | Admitting: Obstetrics & Gynecology

## 2015-02-03 ENCOUNTER — Encounter: Payer: Self-pay | Admitting: Obstetrics & Gynecology

## 2015-02-03 VITALS — BP 119/77 | HR 87 | Temp 98.5°F

## 2015-02-03 DIAGNOSIS — R87619 Unspecified abnormal cytological findings in specimens from cervix uteri: Secondary | ICD-10-CM | POA: Insufficient documentation

## 2015-02-03 DIAGNOSIS — N871 Moderate cervical dysplasia: Secondary | ICD-10-CM | POA: Insufficient documentation

## 2015-02-03 DIAGNOSIS — Z3202 Encounter for pregnancy test, result negative: Secondary | ICD-10-CM

## 2015-02-03 LAB — POCT PREGNANCY, URINE: PREG TEST UR: NEGATIVE

## 2015-02-03 NOTE — Progress Notes (Signed)
   GYNECOLOGY CLINIC PROCEDURE NOTE  Misty Walsh is a 24 y.o. G1P0101 here for LEEP. No GYN concerns. Pap smear and colposcopy reviewed.    Pap ASCUS +HRHPV Colpo Biopsy CIN I and CIN II ECC neg  Risks, benefits, alternatives, and limitations of procedure explained to patient, including pain, bleeding, infection, failure to remove abnormal tissue and failure to cure dysplasia, need for repeat procedures, damage to pelvic organs, cervical incompetence.  Role of HPV,cervical dysplasia and need for close followup was empasized. Informed written consent was obtained. All questions were answered. Time out performed. Urine pregnancy test was negative.  Procedure: The patient was placed in lithotomy position and the bivalved coated speculum was placed in the patient's vagina. A grounding pad placed on the patient. Lugol's solution was applied to the cervix and areas of decreased uptake were noted around the transformation zone.   Local anesthesia was administered via an intracervical block using 10cc of 2% Lidocaine with epinephrine. The suction was turned on and the Large 1X Fisher Cone Biopsy Excisor on 50 Watts of cutting current was used to excise the area of decreased uptake and excise the entire transformation zone. Excellent hemostasis was achieved using roller ball coagulation set at 50 Watts coagulation current. Monsel's solution was then applied and the speculum was removed from the vagina. Specimens were sent to pathology.  The patient tolerated the procedure well. Post-operative instructions given to patient, including instruction to seek medical attention for persistent bright red bleeding, fever, abdominal/pelvic pain, dysuria, nausea or vomiting. She was also told about the possibility of having copious yellow to black tinged discharge for weeks. She was counseled to avoid anything in the vagina (sex/douching/tampons) for 3 weeks. She has a 4 week post-operative check to assess wound  healing, review results and discuss further management.   Jaynie CollinsUGONNA  Anetha Slagel, MD, FACOG Attending Obstetrician & Gynecologist, [facility]

## 2015-02-03 NOTE — Patient Instructions (Signed)
Loop Electrosurgical Excision Procedure, Care After Refer to this sheet in the next few weeks. These instructions provide you with information on caring for yourself after your procedure. Your caregiver may also give you more specific instructions. Your treatment has been planned according to current medical practices, but problems sometimes occur. Call your caregiver if you have any problems or questions after your procedure. HOME CARE INSTRUCTIONS   Do not use tampons, douche, or have sexual intercourse for 2 weeks or as directed by your caregiver.  Begin normal activities if you have no or minimal cramping or bleeding, unless directed otherwise by your caregiver.  Take your temperature if you feel sick. Write down your temperature on paper, and tell your caregiver if you have a fever.  Take all medicines as directed by your caregiver.  Keep all your follow-up appointments and Pap tests as directed by your caregiver. SEEK IMMEDIATE MEDICAL CARE IF:   You have bleeding that is heavier or longer than a normal menstrual cycle.  You have bleeding that is bright red.  You have blood clots.  You have a fever.  You have increasing cramps or pain not relieved by medicine.  You develop abdominal pain that does not seem to be related to the same area of earlier cramping and pain.  You are lightheaded, unusually weak, or faint.  You develop painful or bloody urination.  You develop a bad smelling vaginal discharge. MAKE SURE YOU:  Understand these instructions.  Will watch your condition.  Will get help right away if you are not doing well or get worse.   This information is not intended to replace advice given to you by your health care provider. Make sure you discuss any questions you have with your health care provider.   Document Released: 10/18/2010 Document Revised: 02/25/2014 Document Reviewed: 10/18/2010 Elsevier Interactive Patient Education 2016 Elsevier Inc.  

## 2015-02-09 ENCOUNTER — Telehealth: Payer: Self-pay | Admitting: General Practice

## 2015-02-09 NOTE — Telephone Encounter (Signed)
Per Dr Macon LargeAnyanwu patients LEEP has negative margins, needs a pap in one year with cotesting. Called patient and informed her of results & recommendations. Patient verbalized understanding & had no questions

## 2015-03-23 ENCOUNTER — Ambulatory Visit: Payer: Medicaid Other | Admitting: Obstetrics & Gynecology

## 2016-02-25 IMAGING — US US PELVIS COMPLETE
1 series · 14 of 25 positions shown · non-contrast
Comparison: 01/26/2012

CLINICAL DATA: Heavy vaginal bleeding with clotting. Cramping.
Menorrhagia.

EXAM:
TRANSABDOMINAL AND TRANSVAGINAL ULTRASOUND OF PELVIS
TECHNIQUE: Both transabdominal and transvaginal ultrasound examinations of the
pelvis were performed. Transabdominal technique was performed for
global imaging of the pelvis including uterus, ovaries, adnexal
regions, and pelvic cul-de-sac. It was necessary to proceed with
endovaginal exam following the transabdominal exam to visualize the
uterus and endometrium.

[Series 1: us pelvis complete · 36 acquisitions, 14 frames shown]
[im 1/36]
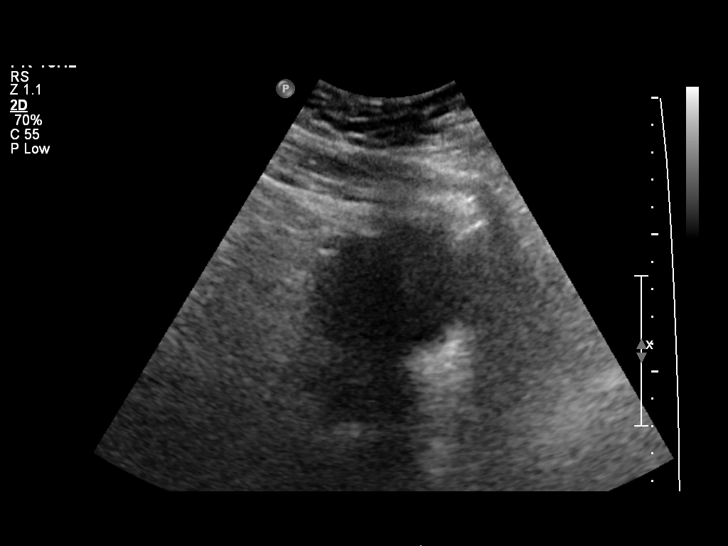
[im 3/36]
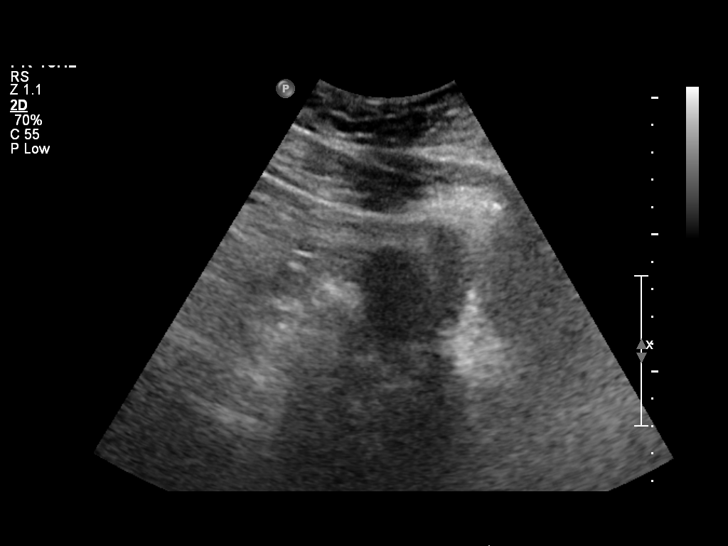
[im 6/36]
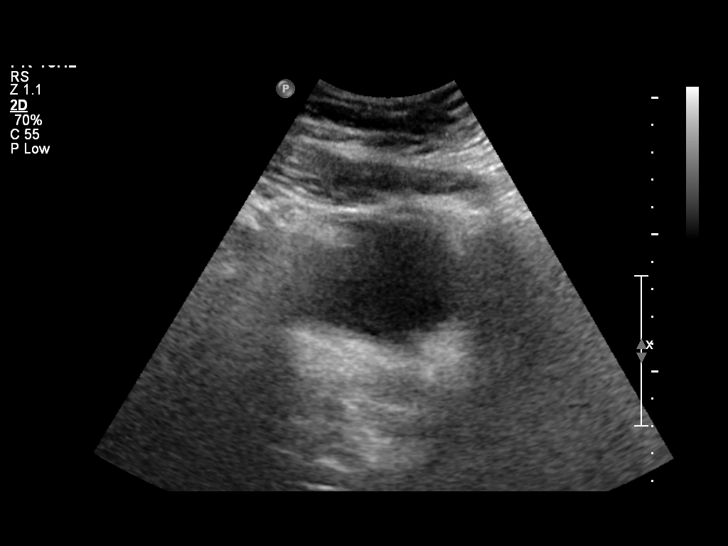
[im 9/36]
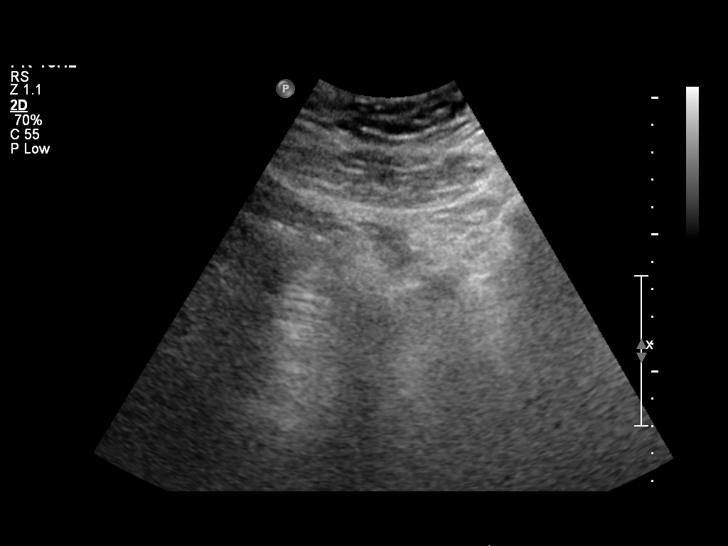
[im 12/36]
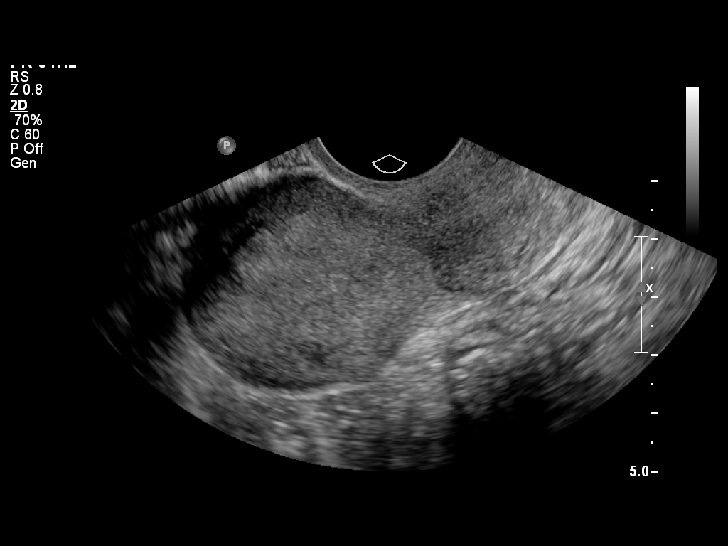
[im 14/36]
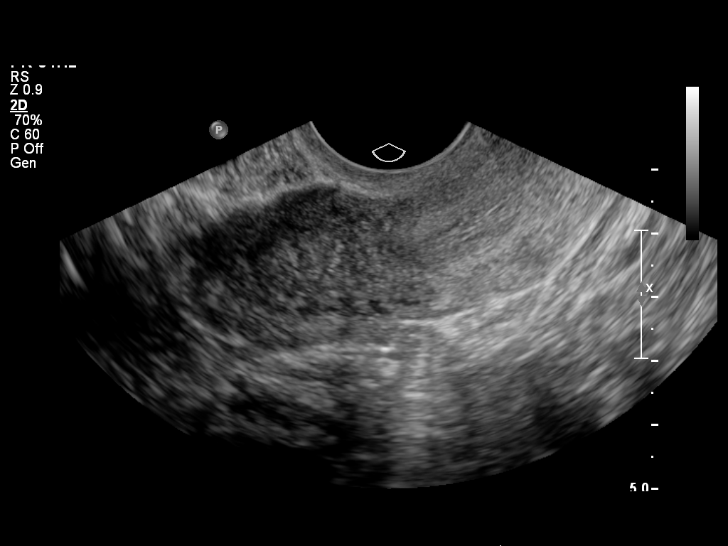
[im 17/36]
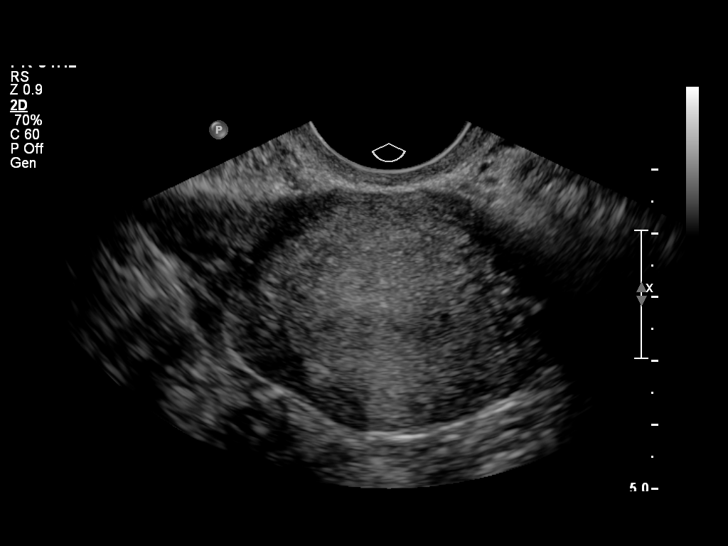
[im 19/36]
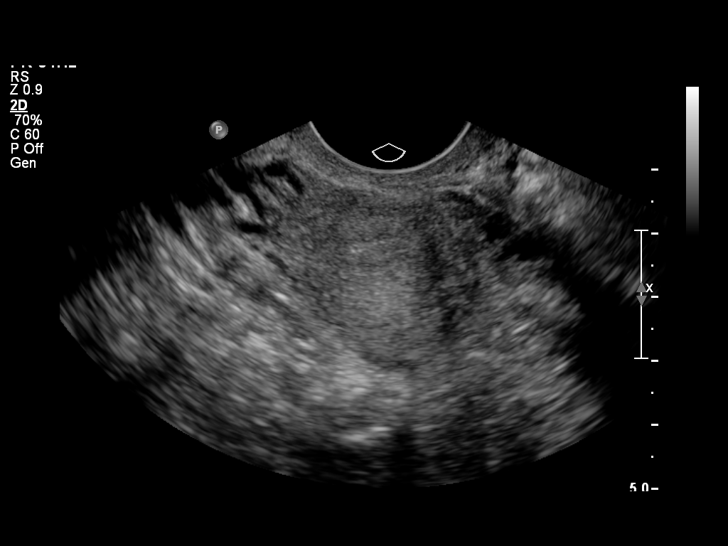
[im 22/36]
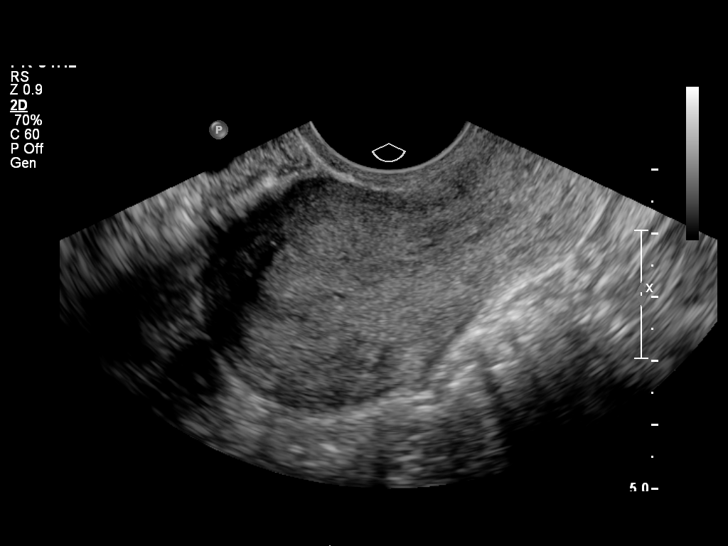
[im 24/36]
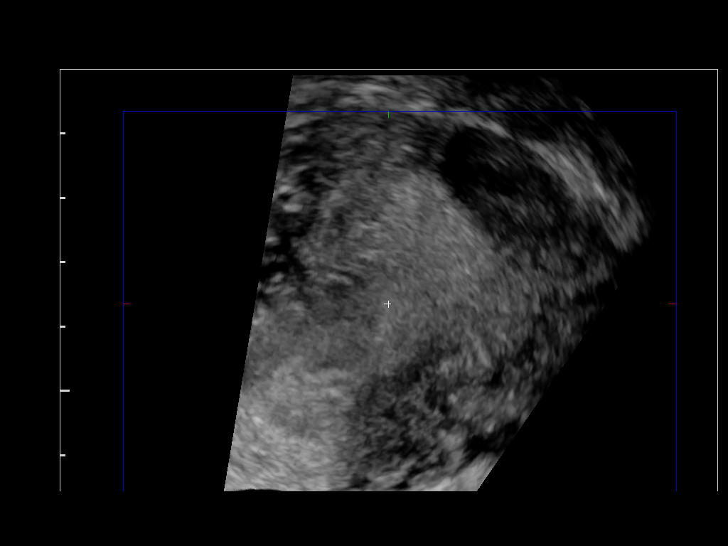
[im 27/36]
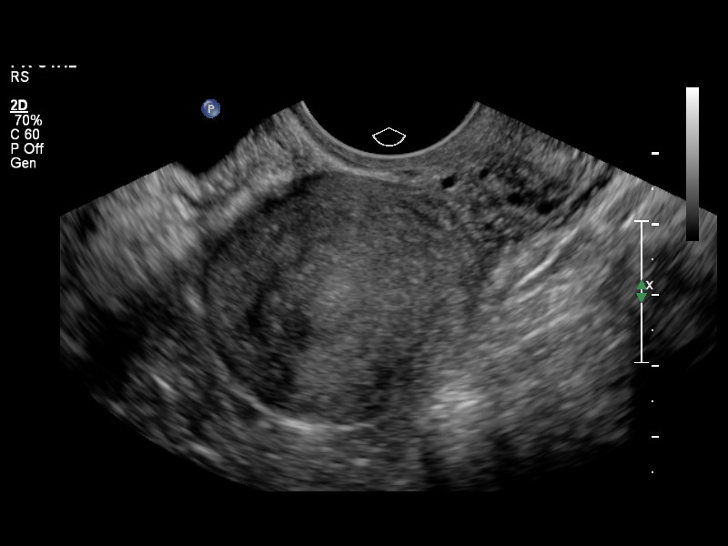
[im 30/36]
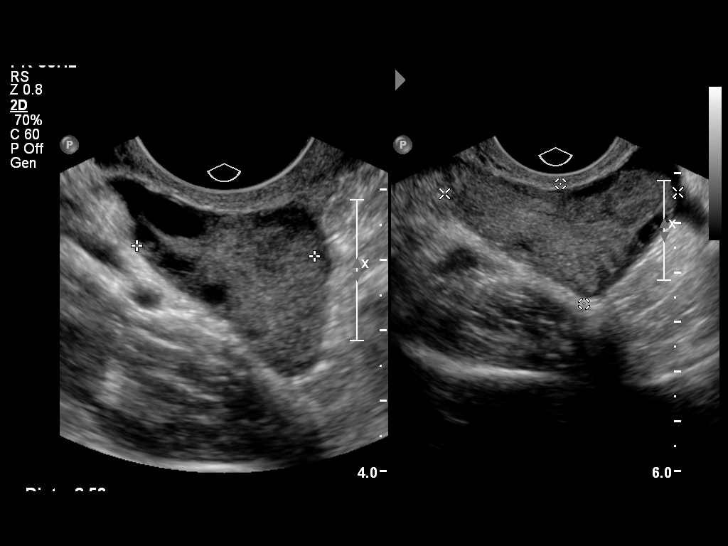
[im 33/36]
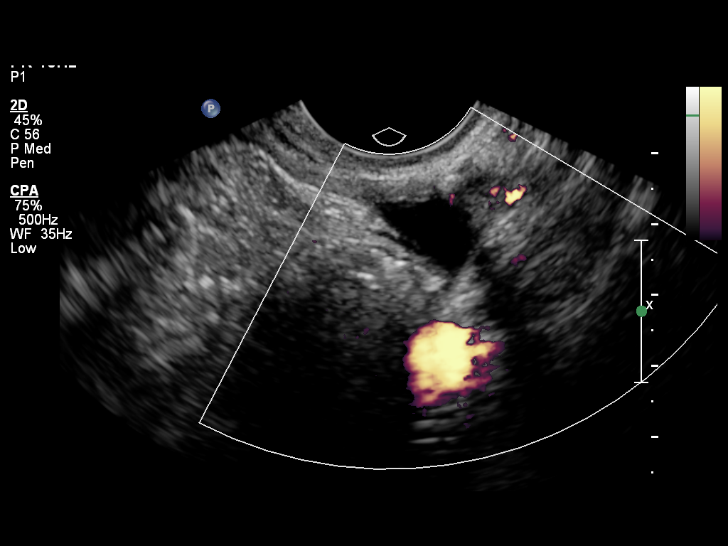
[im 36/36]
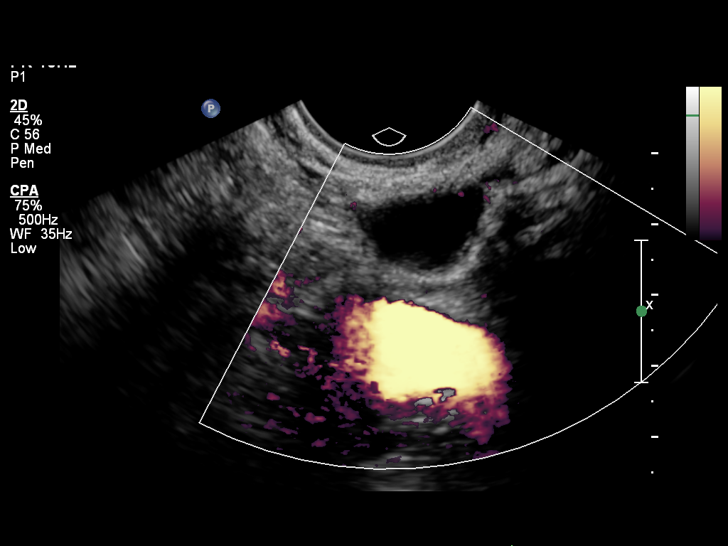

[14 of 25 positions shown; findings below may reference images not displayed]

FINDINGS: Uterus

Measurements: 7.8 by 3.6 by 4.7 cm.. No fibroids or other mass
visualized.

Endometrium

Thickness: 1.2 cm.  No focal abnormality visualized.

Right ovary

Measurements: 4.7 by 2.5 by 2.5 cm. Normal appearance/no adnexal
mass.

Left ovary

Measurements: 2.3 by 1.4 by 1.6 cm. Simple appearing cyst,
approximately 1.3 cm in diameter

Other findings

No free fluid.
IMPRESSION: 1. Small cyst, left ovary. Otherwise, no significant abnormalities
are observed.

## 2016-07-02 ENCOUNTER — Emergency Department (HOSPITAL_COMMUNITY): Payer: Medicaid Other

## 2016-07-02 ENCOUNTER — Encounter (HOSPITAL_COMMUNITY): Payer: Self-pay | Admitting: Emergency Medicine

## 2016-07-02 ENCOUNTER — Emergency Department (HOSPITAL_COMMUNITY)
Admission: EM | Admit: 2016-07-02 | Discharge: 2016-07-02 | Disposition: A | Discharge status: Home / Self Care | Payer: Medicaid Other | Attending: Emergency Medicine | Admitting: Emergency Medicine

## 2016-07-02 DIAGNOSIS — Z7984 Long term (current) use of oral hypoglycemic drugs: Secondary | ICD-10-CM | POA: Insufficient documentation

## 2016-07-02 DIAGNOSIS — I10 Essential (primary) hypertension: Secondary | ICD-10-CM | POA: Diagnosis not present

## 2016-07-02 DIAGNOSIS — R509 Fever, unspecified: Secondary | ICD-10-CM | POA: Diagnosis present

## 2016-07-02 DIAGNOSIS — J181 Lobar pneumonia, unspecified organism: Secondary | ICD-10-CM | POA: Diagnosis not present

## 2016-07-02 DIAGNOSIS — F17210 Nicotine dependence, cigarettes, uncomplicated: Secondary | ICD-10-CM | POA: Diagnosis not present

## 2016-07-02 DIAGNOSIS — J189 Pneumonia, unspecified organism: Secondary | ICD-10-CM

## 2016-07-02 MED ORDER — BENZONATATE 100 MG PO CAPS
100.0000 mg | ORAL_CAPSULE | Freq: Once | ORAL | Status: AC
  Administered 2016-07-02: 100 mg via ORAL
  Filled 2016-07-02: qty 1

## 2016-07-02 MED ORDER — BENZONATATE 100 MG PO CAPS
100.0000 mg | ORAL_CAPSULE | Freq: Three times a day (TID) | ORAL | 0 refills | Status: DC
Start: 2016-07-02 — End: 2017-06-12

## 2016-07-02 MED ORDER — AZITHROMYCIN 250 MG PO TABS
500.0000 mg | ORAL_TABLET | Freq: Once | ORAL | Status: AC
  Administered 2016-07-02: 500 mg via ORAL
  Filled 2016-07-02: qty 2

## 2016-07-02 MED ORDER — AZITHROMYCIN 250 MG PO TABS: ORAL_TABLET | ORAL | 0 refills | Status: DC

## 2016-07-02 NOTE — ED Notes (Signed)
Patient c/o chest pain and back pain when she coughs or moves . Denies productive.  cough.

## 2016-07-02 NOTE — ED Provider Notes (Signed)
[facility]-EMERGENCY DEPT Provider Note   CSN: [id number] Arrival date & time: 07/02/16  [id number]  By signing my name below, I, Marnette Burgessyan Andrew Long, attest that this documentation has been prepared under the direction and in the presence of Lia Hoppingyler M. Samon Dishner, PA-C. Electronically Signed: Marnette Burgessyan Andrew Long, Scribe. 07/02/2016. 10:30 AM.  History   Chief Complaint Chief Complaint  Patient presents with  . Cough  . Fever   The history is provided by the patient and medical records. No language interpreter was used.    HPI Comments:  Misty Walsh is a morbidly obese 26 y.o. female with a PMHx of HTN, Chlamydia, Ovarian Cyst, Depression, Anxiety, and GERD, who presents to the Emergency Department complaining of a persistent, gradually worsening, 8/10 painful non-productive, dry cough onset four days ago. Pt reports for the past four days this painful cough has persisted, leading her to be seen in the ED today. Pt has associated symptoms of fever, chills, CP secondary to coughing episodes only, and rhinorrhea. She tried motrin at home with no relief of her pain. Coughing exacerbates her mild SOB and pain. Pt denies nausea, vomiting, diarrhea, and any other complaints at this time.  PCP: Diona FantiLarry Harper   Past Medical History:  Diagnosis Date  . Anxiety   . Chlamydia   . Depression med made her navel itching and  made her sleepy so she quit taking them  . GERD (gastroesophageal reflux disease)   . History of cesarean section, classical 12/18/2012   2014   . Hypertension   . Ovarian cyst    Patient Active Problem List   Diagnosis Date Noted  . 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 History of cesarean section, classical 12/18/2012  . Allergy 10/29/2012   Past Surgical History:  Procedure Laterality Date  . BREAST SURGERY    . CESAREAN SECTION N/A 12/13/2012   Procedure: PRIMARY CESAREAN SECTION;  Surgeon: Kathreen CosierBernard A Marshall, MD;  Location: [facility] ORS;  Service: Obstetrics;  Laterality:  N/A;   OB History    Gravida Para Term Preterm AB Living   1 1   1   1    SAB TAB Ectopic Multiple Live Births                 Home Medications    Prior to Admission medications   Medication Sig Start Date End Date Taking? Authorizing Provider  atorvastatin (LIPITOR) 20 MG tablet Take 20 mg by mouth daily at 6 PM. Reported on 02/03/2015    [provider]  glimepiride (AMARYL) 4 MG tablet Take 4 mg by mouth daily with breakfast. Reported on 02/03/2015    [provider]  losartan-hydrochlorothiazide (HYZAAR) 50-12.5 MG per tablet Take 1 tablet by mouth daily. Reported on 02/03/2015    [provider]  PARoxetine (PAXIL) 20 MG tablet Take 2,200 mg by mouth daily. Reported on 02/03/2015    [provider]   Family History Family History  Problem Relation Age of Onset  . Diabetes Mother   . Heart disease Mother   . Hypertension Mother   . Depression Mother   . Stroke Mother   . Arthritis Mother   . Stroke Father   . Asthma Sister   . Kidney disease Brother        genetic condition  . Diabetes Maternal Aunt   . Hypertension Maternal Aunt   . Asthma Maternal Aunt   . Epilepsy Maternal Aunt   . Hypertension Maternal Grandmother   .  Dementia Maternal Grandmother   . Heart disease Maternal Grandmother   . Diabetes Paternal Grandmother    Social History Social History  Substance Use Topics  . Smoking status: Current Every Day Smoker    Packs/day: 0.25    Years: 5.00    Types: Cigarettes    Last attempt to quit: 07/19/2012  . Smokeless tobacco: Never Used  . Alcohol use Yes   Allergies   Macrobid [nitrofurantoin macrocrystal]   Review of Systems Review of Systems  Constitutional: Positive for chills and fever.  HENT: Positive for rhinorrhea.   Respiratory: Positive for cough and shortness of breath (mild).   Cardiovascular: Positive for chest pain (secondary to cough).  Gastrointestinal: Negative for diarrhea, nausea and vomiting.      Physical Exam Updated Vital Signs BP (!) 158/89 (BP Location: Left Arm)   Pulse 92   Temp 98.1 F (36.7 C) (Oral)   Resp 20   Ht 5\' 4"  (1.626 m)   Wt 289 lb (131.1 kg)   SpO2 98%   BMI 49.61 kg/m   Physical Exam  Constitutional: She is oriented to person, place, and time. She appears well-developed and well-nourished.  HENT:  Head: Normocephalic.  Eyes: Conjunctivae are normal.  Cardiovascular: Normal rate.   Pulmonary/Chest: Effort normal.  Rhonchi right base.   Abdominal: She exhibits no distension.  Musculoskeletal: Normal range of motion.  Neurological: She is alert and oriented to person, place, and time.  Skin: Skin is warm and dry.  Psychiatric: She has a normal mood and affect.  Nursing note and vitals reviewed.    ED Treatments / Results  DIAGNOSTIC STUDIES:  Oxygen Saturation is 98% on RA, normal by my interpretation.    COORDINATION OF CARE:  10:24 AM Discussed treatment plan with pt at bedside including CXR, Tessalon, and Abx and pt agreed to plan.  Labs (all labs ordered are listed, but only abnormal results are displayed) Labs Reviewed - No data to display  EKG  EKG Interpretation None       Radiology Dg Chest 2 View  Result Date: 07/02/2016 CLINICAL DATA:  Cough and fever for 4 days. EXAM: CHEST  2 VIEW COMPARISON:  None. FINDINGS: The heart size and mediastinal contours are within normal limits. Mild heterogeneous airspace opacity is seen in the right lower lung, suspicious for pneumonia. No evidence of pleural effusion. IMPRESSION: Mild airspace opacity in right lower lung, suspicious for pneumonia. Electronically Signed   By: Myles Rosenthal M.D.   On: 07/02/2016 10:04    Procedures Procedures (including critical care time)  Medications Ordered in ED Medications - No data to display   Initial Impression / Assessment and Plan / ED Course  I have reviewed the triage vital signs and the nursing notes.  Pertinent labs & imaging  results that were available during my care of the patient were reviewed by me and considered in my medical decision making (see chart for details).   {I have reviewed and evaluated the relevant imaging studies.  {I have reviewed the relevant previous healthcare records.  {I obtained HPI from historian.   ED Course:  Assessment: CXR shows evidence of RLL PNA. Pt afebrile, no tachycardia. Normal tensive. Will d/c home with azithromycin. Conservative therapy indicated. Recommend close follow up with PCP. Strict return precautions given.   Disposition/Plan:  DC Home Additional Verbal discharge instructions given and discussed with patient.  Pt Instructed to f/u with PCP in the next week for evaluation and treatment of symptoms. Return  precautions given Pt acknowledges and agrees with plan  Supervising Physician Long, Arlyss Repress, MD   Final Clinical Impressions(s) / ED Diagnoses   Final diagnoses:  Community acquired pneumonia of right lower lobe of lung [facility])    New Prescriptions New Prescriptions   No medications on file    I personally performed the services described in this documentation, which was scribed in my presence. The recorded information has been reviewed and is accurate.    Audry Pili, PA-C 07/02/16 1036    Long, Arlyss Repress, MD 07/02/16 [id number]

## 2016-07-02 NOTE — Discharge Instructions (Signed)
Please read and follow all provided instructions.  Your diagnoses today include:  1. Community acquired pneumonia of right lower lobe of lung (HCC)     Tests performed today include: Blood counts and electrolytes Chest x-ray -- shows pneumonia Vital signs. See below for your results today.   Medications prescribed:   Take any prescribed medications only as directed.  Home care instructions:  Follow any educational materials contained in this packet.  Take the complete course of antibiotics that you were prescribed.   BE VERY CAREFUL not to take multiple medicines containing Tylenol (also called acetaminophen). Doing so can lead to an overdose which can damage your liver and cause liver failure and possibly death.   Follow-up instructions: Please follow-up with your primary care provider in the next 3 days for further evaluation of your symptoms and to ensure resolution of your infection.   Return instructions:  Please return to the Emergency Department if you experience worsening symptoms.  Return immediately with worsening breathing, worsening shortness of breath, or if you feel it is taking you more effort to breathe.  Please return if you have any other emergent concerns.  Additional Information:  Your vital signs today were: BP (!) 158/89 (BP Location: Left Arm)    Pulse 92    Temp 98.1 F (36.7 C) (Oral)    Resp 20    Ht 5\' 4"  (1.626 m)    Wt 131.1 kg    SpO2 98%    BMI 49.61 kg/m  If your blood pressure (BP) was elevated above 135/85 this visit, please have this repeated by your doctor within one month. --------------

## 2016-07-02 NOTE — ED Triage Notes (Signed)
Pt sts cough and pain with cough and fever x 4 days

## 2016-09-19 ENCOUNTER — Encounter (HOSPITAL_COMMUNITY): Payer: Self-pay

## 2016-09-19 ENCOUNTER — Emergency Department (HOSPITAL_COMMUNITY)
Admission: EM | Admit: 2016-09-19 | Discharge: 2016-09-19 | Disposition: A | Discharge status: Home / Self Care | Payer: Medicaid Other | Attending: Emergency Medicine | Admitting: Emergency Medicine

## 2016-09-19 ENCOUNTER — Emergency Department (HOSPITAL_COMMUNITY): Payer: Medicaid Other

## 2016-09-19 DIAGNOSIS — R0602 Shortness of breath: Secondary | ICD-10-CM | POA: Diagnosis present

## 2016-09-19 DIAGNOSIS — Z79899 Other long term (current) drug therapy: Secondary | ICD-10-CM | POA: Diagnosis not present

## 2016-09-19 DIAGNOSIS — Z77098 Contact with and (suspected) exposure to other hazardous, chiefly nonmedicinal, chemicals: Secondary | ICD-10-CM | POA: Diagnosis not present

## 2016-09-19 DIAGNOSIS — I10 Essential (primary) hypertension: Secondary | ICD-10-CM | POA: Insufficient documentation

## 2016-09-19 DIAGNOSIS — R05 Cough: Secondary | ICD-10-CM | POA: Diagnosis not present

## 2016-09-19 DIAGNOSIS — R058 Other specified cough: Secondary | ICD-10-CM

## 2016-09-19 DIAGNOSIS — F17210 Nicotine dependence, cigarettes, uncomplicated: Secondary | ICD-10-CM | POA: Insufficient documentation

## 2016-09-19 LAB — CBC WITH DIFFERENTIAL/PLATELET
Basophils Absolute: 0 10*3/uL (ref 0.0–0.1)
Basophils Relative: 1 %
Eosinophils Absolute: 0.1 10*3/uL (ref 0.0–0.7)
Eosinophils Relative: 2 %
HEMATOCRIT: 38.1 % (ref 36.0–46.0)
HEMOGLOBIN: 12.6 g/dL (ref 12.0–15.0)
LYMPHS ABS: 2.4 10*3/uL (ref 0.7–4.0)
LYMPHS PCT: 44 %
MCH: 30.5 pg (ref 26.0–34.0)
MCHC: 33.1 g/dL (ref 30.0–36.0)
MCV: 92.3 fL (ref 78.0–100.0)
MONOS PCT: 6 %
Monocytes Absolute: 0.3 10*3/uL (ref 0.1–1.0)
NEUTROS ABS: 2.6 10*3/uL (ref 1.7–7.7)
NEUTROS PCT: 47 %
Platelets: 250 10*3/uL (ref 150–400)
RBC: 4.13 MIL/uL (ref 3.87–5.11)
RDW: 13.2 % (ref 11.5–15.5)
WBC: 5.5 10*3/uL (ref 4.0–10.5)

## 2016-09-19 LAB — BASIC METABOLIC PANEL
Anion gap: 8 (ref 5–15)
BUN: 7 mg/dL (ref 6–20)
CHLORIDE: 107 mmol/L (ref 101–111)
CO2: 22 mmol/L (ref 22–32)
CREATININE: 0.93 mg/dL (ref 0.44–1.00)
Calcium: 9.1 mg/dL (ref 8.9–10.3)
GFR calc Af Amer: >60 mL/min (ref >60)
GFR calc non Af Amer: >60 mL/min (ref >60)
Glucose, Bld: 136 mg/dL — ABNORMAL HIGH (ref 65–99)
POTASSIUM: 3.7 mmol/L (ref 3.5–5.1)
Sodium: 137 mmol/L (ref 135–145)

## 2016-09-19 LAB — I-STAT BETA HCG BLOOD, ED (MC, WL, AP ONLY): I-stat hCG, quantitative: <5 m[IU]/mL (ref <5)

## 2016-09-19 LAB — TROPONIN I: Troponin I: <0.03 ng/mL (ref <0.03)

## 2016-09-19 MED ORDER — GUAIFENESIN 100 MG/5ML PO SOLN
15.0000 mL | Freq: Once | ORAL | Status: AC
  Administered 2016-09-19: 300 mg via ORAL
  Filled 2016-09-19: qty 15

## 2016-09-19 MED ORDER — IPRATROPIUM BROMIDE 0.02 % IN SOLN
0.5000 mg | Freq: Once | RESPIRATORY_TRACT | Status: AC
  Administered 2016-09-19: 0.5 mg via RESPIRATORY_TRACT
  Filled 2016-09-19: qty 2.5

## 2016-09-19 MED ORDER — ALBUTEROL SULFATE (2.5 MG/3ML) 0.083% IN NEBU
5.0000 mg | INHALATION_SOLUTION | Freq: Once | RESPIRATORY_TRACT | Status: AC
  Administered 2016-09-19: 5 mg via RESPIRATORY_TRACT
  Filled 2016-09-19: qty 6

## 2016-09-19 MED ORDER — ACETAMINOPHEN 500 MG PO TABS
1000.0000 mg | ORAL_TABLET | Freq: Once | ORAL | Status: AC
  Administered 2016-09-19: 1000 mg via ORAL
  Filled 2016-09-19: qty 2

## 2016-09-19 NOTE — ED Notes (Signed)
Pt left room without telling anyone. Pt left without discharge paperwork. Dr. Denton LankSteinl made aware.

## 2016-09-19 NOTE — ED Triage Notes (Signed)
Pt endorses shob that began this morning while working. Pt was working with bleach but states that chemicals never affect her. Pt had pneumonia 2 months ago and states this feels the same. Pt observed to be rocking back and forth in wheelchair. Breath sounds clear. Pt speaking in 4-5 word sentences. VSS.

## 2016-09-19 NOTE — ED Provider Notes (Signed)
[facility]-EMERGENCY DEPT Provider Note   CSN: [id number] Arrival date & time: 09/19/16  1107     History   Chief Complaint Chief Complaint  Patient presents with  . Shortness of Breath    HPI Misty Walsh is a 26 y.o. female.  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  States working with similar chemicals have not affected her in past. Denies hx asthma. No preceding cough, sore throat, or uri c/o. No fever or chills. States chest feels tight. No other recent episodes of chest pain or discomfort. No prior unusual doe or fatigue. Denies leg pain or swelling. No hx cad. No hx dvt or pe. +smoker.    The history is provided by the patient.  Shortness of Breath  Associated symptoms include cough. Pertinent negatives include no fever, no headaches, no sore throat, no neck pain, no chest pain, no vomiting, no abdominal pain, no rash and no leg swelling.    Past Medical History:  Diagnosis Date  . Anxiety   . Chlamydia   . Depression med made her navel itching and  made her sleepy so she quit taking them  . GERD (gastroesophageal reflux disease)   . History of cesarean section, classical 12/18/2012   2014   . Hypertension   . Ovarian cyst     Patient Active Problem List   Diagnosis Date Noted  . 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 History of cesarean section, classical 12/18/2012  . Allergy 10/29/2012    Past Surgical History:  Procedure Laterality Date  . BREAST SURGERY    . CESAREAN SECTION N/A 12/13/2012   Procedure: PRIMARY CESAREAN SECTION;  Surgeon: Kathreen Cosier, MD;  Location: [facility];  Service: Obstetrics;  Laterality: N/A;    OB History    Gravida Para Term Preterm AB Living   1 1   1   1    SAB TAB Ectopic Multiple Live Births                   Home Medications    Prior to Admission medications   Medication Sig Start Date End Date Taking? Authorizing Provider  atorvastatin (LIPITOR) 20 MG tablet  Take 20 mg by mouth daily at 6 PM. Reported on 02/03/2015    [provider]  azithromycin (ZITHROMAX Z-PAK) 250 MG tablet 500mg  PO once daily on day 1. THEN 250mg  PO once daily for 4 days 07/02/16   Audry Pili, PA-C  benzonatate (TESSALON) 100 MG capsule Take 1 capsule (100 mg total) by mouth every 8 (eight) hours. 07/02/16   Audry Pili, PA-C  glimepiride (AMARYL) 4 MG tablet Take 4 mg by mouth daily with breakfast. Reported on 02/03/2015    [provider]  losartan-hydrochlorothiazide (HYZAAR) 50-12.5 MG per tablet Take 1 tablet by mouth daily. Reported on 02/03/2015    [provider]  PARoxetine (PAXIL) 20 MG tablet Take 2,200 mg by mouth daily. Reported on 02/03/2015    [provider]    Family History Family History  Problem Relation Age of Onset  . Diabetes Mother   . Heart disease Mother   . Hypertension Mother   . Depression Mother   . Stroke Mother   . Arthritis Mother   . Stroke Father   . Asthma Sister   . Kidney disease Brother        genetic condition  . Diabetes Maternal Aunt   . Hypertension Maternal Aunt   .  Asthma Maternal Aunt   . Epilepsy Maternal Aunt   . Hypertension Maternal Grandmother   . Dementia Maternal Grandmother   . Heart disease Maternal Grandmother   . Diabetes Paternal Grandmother     Social History Social History  Substance Use Topics  . Smoking status: Current Every Day Smoker    Packs/day: 0.25    Years: 5.00    Types: Cigarettes  . Smokeless tobacco: Never Used  . Alcohol use Yes     Comment: occ     Allergies   Macrobid [nitrofurantoin macrocrystal]   Review of Systems Review of Systems  Constitutional: Negative for fever.  HENT: Negative for sore throat.   Eyes: Negative for redness.  Respiratory: Positive for cough and shortness of breath.   Cardiovascular: Negative for chest pain and leg swelling.  Gastrointestinal: Negative for abdominal pain and vomiting.  Genitourinary: Negative  for flank pain.  Musculoskeletal: Negative for back pain and neck pain.  Skin: Negative for rash.  Neurological: Negative for headaches.  Hematological: Does not bruise/bleed easily.  Psychiatric/Behavioral: Negative for confusion.     Physical Exam Updated Vital Signs BP (!) 152/100 (BP Location: Right Arm)   Pulse 89   Temp 98.4 F (36.9 C) (Oral)   Resp (!) 24   Ht 1.626 m (5\' 4" )   Wt 131.1 kg (289 lb)   LMP 09/14/2016 (Exact Date)   SpO2 96%   BMI 49.61 kg/m   Physical Exam  Constitutional: She appears well-developed and well-nourished. No distress.  HENT:  Mouth/Throat: Oropharynx is clear and moist.  Eyes: Conjunctivae are normal. No scleral icterus.  Neck: Neck supple. No tracheal deviation present.  Cardiovascular: Normal rate, regular rhythm, normal heart sounds and intact distal pulses.  Exam reveals no gallop and no friction rub.   No murmur heard. Pulmonary/Chest: Effort normal. No respiratory distress.  Coughing, non productive. Sl wheeze.   Abdominal: Soft. Normal appearance and bowel sounds are normal. She exhibits no distension. There is no tenderness.  Musculoskeletal: She exhibits no edema or tenderness.  Neurological: She is alert.  Skin: Skin is warm and dry. No rash noted. She is not diaphoretic.  Psychiatric: She has a normal mood and affect.  Nursing note and vitals reviewed.    ED Treatments / Results  Labs (all labs ordered are listed, but only abnormal results are displayed) Results for orders placed or performed during the hospital encounter of 09/19/16  CBC with Differential  Result Value Ref Range   WBC 5.5 4.0 - 10.5 K/uL   RBC 4.13 3.87 - 5.11 MIL/uL   Hemoglobin 12.6 12.0 - 15.0 g/dL   HCT 16.138.1 09.636.0 - 04.546.0 %   MCV 92.3 78.0 - 100.0 fL   MCH 30.5 26.0 - 34.0 pg   MCHC 33.1 30.0 - 36.0 g/dL   RDW 40.913.2 81.111.5 - 91.415.5 %   Platelets 250 150 - 400 K/uL   Neutrophils Relative % 47 %   Neutro Abs 2.6 1.7 - 7.7 K/uL   Lymphocytes  Relative 44 %   Lymphs Abs 2.4 0.7 - 4.0 K/uL   Monocytes Relative 6 %   Monocytes Absolute 0.3 0.1 - 1.0 K/uL   Eosinophils Relative 2 %   Eosinophils Absolute 0.1 0.0 - 0.7 K/uL   Basophils Relative 1 %   Basophils Absolute 0.0 0.0 - 0.1 K/uL  Basic metabolic panel  Result Value Ref Range   Sodium 137 135 - 145 mmol/L   Potassium 3.7 3.5 - 5.1 mmol/L  Chloride 107 101 - 111 mmol/L   CO2 22 22 - 32 mmol/L   Glucose, Bld 136 (H) 65 - 99 mg/dL   BUN 7 6 - 20 mg/dL   Creatinine, Ser 1.610.93 0.44 - 1.00 mg/dL   Calcium 9.1 8.9 - 09.610.3 mg/dL   GFR calc non Af Amer >60 >60 mL/min   GFR calc Af Amer >60 >60 mL/min   Anion gap 8 5 - 15  Troponin I  Result Value Ref Range   Troponin I <0.03 <0.03 ng/mL  I-Stat Beta hCG blood, ED ([facility], [facility], [facility] only)  Result Value Ref Range   I-stat hCG, quantitative <5.0 <5 mIU/mL   Comment 3           Dg Chest 2 View  Result Date: 09/19/2016 CLINICAL DATA:  Cough and shortness of breath after exposure to bleeds fumes. Episode of pneumonia 2 months ago. Current smoker. EXAM: CHEST  2 VIEW COMPARISON:  Chest x-ray of Jul 02, 2016 FINDINGS: The lungs are adequately inflated. The interstitial markings are coarse. There is no focal infiltrate. There is no pleural effusion. The heart is top-normal in size. The pulmonary vascularity is not engorged. The trachea is midline. The bony thorax exhibits no acute abnormality. IMPRESSION: Mild stable interstitial prominence which may reflect the patient's smoking history. There is no pneumonia nor other acute cardiopulmonary abnormality. Electronically Signed   By: David  [place]Jordan M.D.   On: 09/19/2016 11:42    EKG  EKG Interpretation  Date/Time:  Thursday September 19 2016 11:11:26 EDT Ventricular Rate:  94 PR Interval:    QRS Duration: 90 QT Interval:  346 QTC Calculation: 432 R Axis:   34 Text Interpretation:  Sinus rhythm Nonspecific T wave abnormality Confirmed by Cathren LaineSteinl, Denicia Pagliarulo ([id number]) on 09/19/2016 12:53:34 PM        Radiology Dg Chest 2 View  Result Date: 09/19/2016 CLINICAL DATA:  Cough and shortness of breath after exposure to bleeds fumes. Episode of pneumonia 2 months ago. Current smoker. EXAM: CHEST  2 VIEW COMPARISON:  Chest x-ray of Jul 02, 2016 FINDINGS: The lungs are adequately inflated. The interstitial markings are coarse. There is no focal infiltrate. There is no pleural effusion. The heart is top-normal in size. The pulmonary vascularity is not engorged. The trachea is midline. The bony thorax exhibits no acute abnormality. IMPRESSION: Mild stable interstitial prominence which may reflect the patient's smoking history. There is no pneumonia nor other acute cardiopulmonary abnormality. Electronically Signed   By: David  [place] M.D.   On: 09/19/2016 11:42    Procedures Procedures (including critical care time)  Medications Ordered in ED Medications  albuterol (PROVENTIL) (2.5 MG/3ML) 0.083% nebulizer solution 5 mg (not administered)  ipratropium (ATROVENT) nebulizer solution 0.5 mg (not administered)     Initial Impression / Assessment and Plan / ED Course  I have reviewed the triage vital signs and the nursing notes.  Pertinent labs & imaging results that were available during my care of the patient were reviewed by me and considered in my medical decision making (see chart for details).  Albuterol neb.   Robitussin.   Cxr.  Reviewed nursing notes and prior charts for additional history.   Recheck, no wheezing, no increased wob. No cp. Afeb.   Pt appears stable for d/c.     Final Clinical Impressions(s) / ED Diagnoses   Final diagnoses:  None    New Prescriptions New Prescriptions   No medications on file     Cathren LaineSteinl, Brigid Vandekamp, MD  09/19/16 1403  

## 2016-09-19 NOTE — ED Notes (Signed)
Pt stated "I am ready to go, can you let my doctor know." RN notified Dr. Denton LankSteinl that pt was ready to be discharged.

## 2016-09-19 NOTE — Discharge Instructions (Signed)
It was our pleasure to provide your ER care today - we hope that you feel better.  Follow up with primary care doctor in the next few days if symptoms fail to improve/resolve.  Return to ER if worse, new symptoms, fevers, increased trouble breathing, other concern.

## 2017-02-28 ENCOUNTER — Other Ambulatory Visit: Payer: Self-pay | Admitting: Family Medicine

## 2017-03-07 ENCOUNTER — Other Ambulatory Visit: Payer: Self-pay | Admitting: Primary Care

## 2017-03-07 DIAGNOSIS — N6310 Unspecified lump in the right breast, unspecified quadrant: Secondary | ICD-10-CM

## 2017-03-07 DIAGNOSIS — N6320 Unspecified lump in the left breast, unspecified quadrant: Secondary | ICD-10-CM

## 2017-03-14 ENCOUNTER — Other Ambulatory Visit: Payer: Self-pay | Admitting: Primary Care

## 2017-03-14 ENCOUNTER — Ambulatory Visit
Admission: RE | Admit: 2017-03-14 | Discharge: 2017-03-14 | Disposition: A | Discharge status: Home / Self Care | Payer: Medicaid Other | Source: Ambulatory Visit | Attending: Primary Care | Admitting: Primary Care

## 2017-03-14 DIAGNOSIS — N6310 Unspecified lump in the right breast, unspecified quadrant: Secondary | ICD-10-CM

## 2017-03-14 DIAGNOSIS — N6320 Unspecified lump in the left breast, unspecified quadrant: Secondary | ICD-10-CM

## 2017-06-11 ENCOUNTER — Encounter (HOSPITAL_COMMUNITY): Payer: Self-pay

## 2017-06-11 ENCOUNTER — Inpatient Hospital Stay (HOSPITAL_COMMUNITY)
Admission: AD | Admit: 2017-06-11 | Discharge: 2017-06-12 | Disposition: A | Discharge status: Home / Self Care | Payer: Medicaid Other | Source: Ambulatory Visit | Attending: Obstetrics & Gynecology | Admitting: Obstetrics & Gynecology

## 2017-06-11 DIAGNOSIS — R102 Pelvic and perineal pain: Secondary | ICD-10-CM

## 2017-06-11 DIAGNOSIS — R109 Unspecified abdominal pain: Secondary | ICD-10-CM | POA: Diagnosis present

## 2017-06-11 DIAGNOSIS — N760 Acute vaginitis: Secondary | ICD-10-CM | POA: Diagnosis not present

## 2017-06-11 DIAGNOSIS — N730 Acute parametritis and pelvic cellulitis: Secondary | ICD-10-CM

## 2017-06-11 DIAGNOSIS — B9689 Other specified bacterial agents as the cause of diseases classified elsewhere: Secondary | ICD-10-CM

## 2017-06-11 DIAGNOSIS — F17210 Nicotine dependence, cigarettes, uncomplicated: Secondary | ICD-10-CM | POA: Insufficient documentation

## 2017-06-11 DIAGNOSIS — Z3202 Encounter for pregnancy test, result negative: Secondary | ICD-10-CM | POA: Diagnosis not present

## 2017-06-11 HISTORY — DX: Papillomavirus as the cause of diseases classified elsewhere: B97.7

## 2017-06-11 LAB — CBC WITH DIFFERENTIAL/PLATELET
BASOS ABS: 0 10*3/uL (ref 0.0–0.1)
BASOS PCT: 0 %
EOS PCT: 3 %
Eosinophils Absolute: 0.2 10*3/uL (ref 0.0–0.7)
HCT: 38.2 % (ref 36.0–46.0)
Hemoglobin: 12.9 g/dL (ref 12.0–15.0)
Lymphocytes Relative: 47 %
Lymphs Abs: 3.7 10*3/uL (ref 0.7–4.0)
MCH: 31.6 pg (ref 26.0–34.0)
MCHC: 33.8 g/dL (ref 30.0–36.0)
MCV: 93.6 fL (ref 78.0–100.0)
MONO ABS: 0.3 10*3/uL (ref 0.1–1.0)
Monocytes Relative: 4 %
NEUTROS ABS: 3.5 10*3/uL (ref 1.7–7.7)
Neutrophils Relative %: 46 %
PLATELETS: 244 10*3/uL (ref 150–400)
RBC: 4.08 MIL/uL (ref 3.87–5.11)
RDW: 12.7 % (ref 11.5–15.5)
WBC: 7.8 10*3/uL (ref 4.0–10.5)

## 2017-06-11 LAB — WET PREP, GENITAL
SPERM: NONE SEEN
TRICH WET PREP: NONE SEEN
WBC WET PREP: NONE SEEN
YEAST WET PREP: NONE SEEN

## 2017-06-11 LAB — URINALYSIS, ROUTINE W REFLEX MICROSCOPIC
BILIRUBIN URINE: NEGATIVE
GLUCOSE, UA: NEGATIVE mg/dL
Hgb urine dipstick: NEGATIVE
Ketones, ur: NEGATIVE mg/dL
Leukocytes, UA: NEGATIVE
NITRITE: NEGATIVE
Protein, ur: NEGATIVE mg/dL
SPECIFIC GRAVITY, URINE: 1.026 (ref 1.005–1.030)
pH: 5 (ref 5.0–8.0)

## 2017-06-11 LAB — POCT PREGNANCY, URINE: Preg Test, Ur: NEGATIVE

## 2017-06-11 MED ORDER — CEFTRIAXONE SODIUM 250 MG IJ SOLR
250.0000 mg | Freq: Once | INTRAMUSCULAR | Status: AC
Start: 2017-06-11 — End: 2017-06-12
  Administered 2017-06-12: 250 mg via INTRAMUSCULAR
  Filled 2017-06-11: qty 250

## 2017-06-11 MED ORDER — KETOROLAC TROMETHAMINE 60 MG/2ML IM SOLN
60.0000 mg | Freq: Once | INTRAMUSCULAR | Status: DC
Start: 2017-06-11 — End: 2017-06-11

## 2017-06-11 MED ORDER — AZITHROMYCIN 250 MG PO TABS
1000.0000 mg | ORAL_TABLET | Freq: Once | ORAL | Status: AC
  Administered 2017-06-12: 1000 mg via ORAL
  Filled 2017-06-11: qty 4

## 2017-06-11 MED ORDER — IBUPROFEN 600 MG PO TABS: 600.0000 mg | ORAL_TABLET | Freq: Four times a day (QID) | ORAL | 1 refills | Status: DC | PRN

## 2017-06-11 NOTE — MAU Provider Note (Signed)
Chief Complaint:  Abdominal Pain   First Provider Initiated Contact with Patient 06/11/17 2228      HPI: Misty Walsh is a 27 y.o. G1P0101 who presents to maternity admissions reporting lower abdominal pain since Saturday.  Period ended the day before.  . She reports no vaginal bleeding, vaginal itching/burning, urinary symptoms, h/a, dizziness, n/v, or fever/chills.    Also has some pressure and pain in upper abdomen Lower pelvic pain started after menses last week but didn't go away.  States has not had IC in the past 5 years but has a toddler with her.  Does state she might want STD testing.   Abdominal Pain  This is a new problem. The current episode started in the past 7 days. The onset quality is gradual. The problem occurs intermittently. The problem has been waxing and waning. The pain is located in the generalized abdominal region, LLQ, RLQ, epigastric region and periumbilical region. The pain is mild. The quality of the pain is cramping and a sensation of fullness. The abdominal pain does not radiate. Pertinent negatives include no anorexia, constipation, diarrhea, dysuria, fever, frequency, myalgias, nausea or vomiting. Nothing aggravates the pain. The pain is relieved by nothing. Treatments tried: ibuprofen once. The treatment provided no relief.     RN Note: Pt presents to MAU with lower abdominal cramping and nausea that started on Saturday 4/20. Pt finished menstrual cycle on Friday    Past Medical History: Past Medical History:  Diagnosis Date  . Anxiety   . Chlamydia   . Depression med made her navel itching and  made her sleepy so she quit taking them  . GERD (gastroesophageal reflux disease)   . History of cesarean section, classical 12/18/2012   2014   . Human papilloma virus   . Hypertension   . Ovarian cyst     Past obstetric history: OB History  Gravida Para Term Preterm AB Living  1 1   1   1   SAB TAB Ectopic Multiple Live Births               #  Outcome Date GA Lbr Len/2nd Weight Sex Delivery Anes PTL Lv  1 Preterm 12/13/12 [redacted]w[redacted]d / 00:59 1 lb 10.8 oz (0.76 kg) M CS-LTranv Spinal      Past Surgical History: Past Surgical History:  Procedure Laterality Date  . BREAST BIOPSY Left 2012   benign fibroadeoma  . BREAST SURGERY    . CESAREAN SECTION N/A 12/13/2012   Procedure: PRIMARY CESAREAN SECTION;  Surgeon: Kathreen Cosier, MD;  Location: [facility];  Service: Obstetrics;  Laterality: N/A;  . LEEP      Family History: Family History  Problem Relation Age of Onset  . Diabetes Mother   . Heart disease Mother   . Hypertension Mother   . Depression Mother   . Stroke Mother   . Arthritis Mother   . Stroke Father   . Asthma Sister   . Kidney disease Brother        genetic condition  . Diabetes Maternal Aunt   . Hypertension Maternal Aunt   . Asthma Maternal Aunt   . Epilepsy Maternal Aunt   . Hypertension Maternal Grandmother   . Dementia Maternal Grandmother   . Heart disease Maternal Grandmother   . Diabetes Paternal Grandmother     Social History: Social History   Tobacco Use  . Smoking status: Current Every Day Smoker    Packs/day: 0.25    Years: 5.00  Pack years: 1.25    Types: Cigarettes  . Smokeless tobacco: Never Used  Substance Use Topics  . Alcohol use: Yes    Comment: occ  . Drug use: No    Allergies:  Allergies  Allergen Reactions  . Macrobid [Nitrofurantoin Macrocrystal] Itching    Caused the patient to feel hot.    Meds:  Medications Prior to Admission  Medication Sig Dispense Refill Last Dose  . amLODipine (NORVASC) 10 MG tablet TK 1 T PO ONCE D  0 06/10/2017 at Unknown time  . ibuprofen (ADVIL,MOTRIN) 200 MG tablet Take 200 mg by mouth every 6 (six) hours as needed for headache or cramping.   06/10/2017 at Unknown time  . metoprolol succinate (TOPROL-XL) 25 MG 24 hr tablet TK 1 T PO QD  0 06/10/2017 at Unknown time  . benzonatate (TESSALON) 100 MG capsule Take 1 capsule (100 mg total)  by mouth every 8 (eight) hours. 21 capsule 0     I have reviewed patient's Past Medical Hx, Surgical Hx, Family Hx, Social Hx, medications and allergies.  ROS:  Review of Systems  Constitutional: Negative for fever.  Gastrointestinal: Positive for abdominal pain. Negative for anorexia, constipation, diarrhea, nausea and vomiting.  Genitourinary: Negative for dysuria and frequency.  Musculoskeletal: Negative for myalgias.   Other systems negative     Physical Exam   Patient Vitals for the past 24 hrs:  BP Temp Temp src Pulse Resp Height Weight  06/11/17 2104 (!) 149/90 98.3 F (36.8 C) Oral 92 18 - -  06/11/17 2057 - - - - - 5\' 4"  (1.626 m) 294 lb (133.4 kg)   Constitutional: Well-developed, well-nourished female in no acute distress.  Cardiovascular: normal rate and rhythm, no ectopy audible, S1 & S2 heard, no murmur Respiratory: normal effort, no distress. Lungs CTAB with no wheezes or crackles GI: Abd soft, non-tender.  Nondistended.  No rebound, No guarding.  Bowel Sounds audible  MS: Extremities nontender, no edema, normal ROM Neurologic: Alert and oriented x 4.   Grossly nonfocal. GU: Neg CVAT. Skin:  Warm and Dry Psych:  Affect appropriate.  PELVIC EXAM: Cervix pink, visually closed, without lesion, scant white creamy discharge, vaginal walls and external genitalia normal Bimanual exam: Cervix firm, anterior, Positive CMT, uterus tender, nonenlarged, adnexa without tenderness, enlargement, or mass    Labs: Results for orders placed or performed during the hospital encounter of 06/11/17 (from the past 24 hour(s))  Urinalysis, Routine w reflex microscopic     Status: Abnormal   Collection Time: 06/11/17  8:56 PM  Result Value Ref Range   Color, Urine YELLOW YELLOW   APPearance HAZY (A) CLEAR   Specific Gravity, Urine 1.026 1.005 - 1.030   pH 5.0 5.0 - 8.0   Glucose, UA NEGATIVE NEGATIVE mg/dL   Hgb urine dipstick NEGATIVE NEGATIVE   Bilirubin Urine NEGATIVE  NEGATIVE   Ketones, ur NEGATIVE NEGATIVE mg/dL   Protein, ur NEGATIVE NEGATIVE mg/dL   Nitrite NEGATIVE NEGATIVE   Leukocytes, UA NEGATIVE NEGATIVE  Pregnancy, urine POC     Status: None   Collection Time: 06/11/17  9:19 PM  Result Value Ref Range   Preg Test, Ur NEGATIVE NEGATIVE  Wet prep, genital     Status: Abnormal   Collection Time: 06/11/17 11:00 PM  Result Value Ref Range   Yeast Wet Prep HPF POC NONE SEEN NONE SEEN   Trich, Wet Prep NONE SEEN NONE SEEN   Clue Cells Wet Prep HPF POC PRESENT (A) NONE  SEEN   WBC, Wet Prep HPF POC NONE SEEN NONE SEEN   Sperm NONE SEEN   CBC with Differential/Platelet     Status: None   Collection Time: 06/11/17 11:37 PM  Result Value Ref Range   WBC 7.8 4.0 - 10.5 K/uL   RBC 4.08 3.87 - 5.11 MIL/uL   Hemoglobin 12.9 12.0 - 15.0 g/dL   HCT 91.438.2 78.236.0 - 95.646.0 %   MCV 93.6 78.0 - 100.0 fL   MCH 31.6 26.0 - 34.0 pg   MCHC 33.8 30.0 - 36.0 g/dL   RDW 21.312.7 08.611.5 - 57.815.5 %   Platelets 244 150 - 400 K/uL   Neutrophils Relative % 46 %   Neutro Abs 3.5 1.7 - 7.7 K/uL   Lymphocytes Relative 47 %   Lymphs Abs 3.7 0.7 - 4.0 K/uL   Monocytes Relative 4 %   Monocytes Absolute 0.3 0.1 - 1.0 K/uL   Eosinophils Relative 3 %   Eosinophils Absolute 0.2 0.0 - 0.7 K/uL   Basophils Relative 0 %   Basophils Absolute 0.0 0.0 - 0.1 K/uL  Comprehensive metabolic panel     Status: Abnormal   Collection Time: 06/11/17 11:37 PM  Result Value Ref Range   Sodium 138 135 - 145 mmol/L   Potassium 3.4 (L) 3.5 - 5.1 mmol/L   Chloride 105 101 - 111 mmol/L   CO2 22 22 - 32 mmol/L   Glucose, Bld 149 (H) 65 - 99 mg/dL   BUN 9 6 - 20 mg/dL   Creatinine, Ser 4.690.92 0.44 - 1.00 mg/dL   Calcium 9.0 8.9 - 62.910.3 mg/dL   Total Protein 7.3 6.5 - 8.1 g/dL   Albumin 4.1 3.5 - 5.0 g/dL   AST 27 15 - 41 U/L   ALT 22 14 - 54 U/L   Alkaline Phosphatase 93 38 - 126 U/L   Total Bilirubin 0.3 0.3 - 1.2 mg/dL   GFR calc non Af Amer >60 >60 mL/min   GFR calc Af Amer >60 >60 mL/min    Anion gap 11 5 - 15  Lipase, blood     Status: None   Collection Time: 06/11/17 11:37 PM  Result Value Ref Range   Lipase 24 11 - 51 U/L    Imaging:  No results found.  MAU Course/MDM: I have ordered labs as follows: CBC and CMET, both normal   UA normal Lipase normal  Imaging ordered: none here Results reviewed.    Treatments in MAU included Rocephin and Zithromax for Presumptive PID.  One percocet for pain.  Will check outpatient US.   Pt stable at time of discharge.  Assessment: Abdominal pain Not pregnant Presumptive PID Bacterial Vaginosis  Plan: Discharge home Recommend Call her internal med doctor if pain not at least 50% improved by Friday.  She needs to talk to them about her BP med also Rx sent for Flagyl for PID and BV  Encouraged to return here or to other Urgent Care/ED if she develops worsening of symptoms, increase in pain, fever, or other concerning symptoms.   Wynelle BourgeoisMarie Shalimar Mcclain CNM, MSN Certified Nurse-Midwife 06/11/2017 10:28 PM

## 2017-06-11 NOTE — MAU Note (Signed)
Pt presents to MAU with lower abdominal cramping and nausea that started on Saturday 4/20. Pt finished menstrual cycle on Friday.

## 2017-06-12 DIAGNOSIS — N730 Acute parametritis and pelvic cellulitis: Secondary | ICD-10-CM | POA: Diagnosis not present

## 2017-06-12 DIAGNOSIS — N760 Acute vaginitis: Secondary | ICD-10-CM | POA: Diagnosis not present

## 2017-06-12 LAB — COMPREHENSIVE METABOLIC PANEL
ALBUMIN: 4.1 g/dL (ref 3.5–5.0)
ALT: 22 U/L (ref 14–54)
AST: 27 U/L (ref 15–41)
Alkaline Phosphatase: 93 U/L (ref 38–126)
Anion gap: 11 (ref 5–15)
BUN: 9 mg/dL (ref 6–20)
CHLORIDE: 105 mmol/L (ref 101–111)
CO2: 22 mmol/L (ref 22–32)
Calcium: 9 mg/dL (ref 8.9–10.3)
Creatinine, Ser: 0.92 mg/dL (ref 0.44–1.00)
GFR calc Af Amer: >60 mL/min (ref >60)
GFR calc non Af Amer: >60 mL/min (ref >60)
GLUCOSE: 149 mg/dL — AB (ref 65–99)
POTASSIUM: 3.4 mmol/L — AB (ref 3.5–5.1)
Sodium: 138 mmol/L (ref 135–145)
Total Bilirubin: 0.3 mg/dL (ref 0.3–1.2)
Total Protein: 7.3 g/dL (ref 6.5–8.1)

## 2017-06-12 LAB — LIPASE, BLOOD: LIPASE: 24 U/L (ref 11–51)

## 2017-06-12 MED ORDER — METRONIDAZOLE 500 MG PO TABS: 500.0000 mg | ORAL_TABLET | Freq: Two times a day (BID) | ORAL | 0 refills | Status: AC

## 2017-06-12 MED ORDER — OXYCODONE-ACETAMINOPHEN 5-325 MG PO TABS
1.0000 | ORAL_TABLET | Freq: Once | ORAL | Status: AC
  Administered 2017-06-12: 1 via ORAL
  Filled 2017-06-12: qty 1

## 2017-06-12 MED ORDER — TRAMADOL HCL 50 MG PO TABS: 50.0000 mg | ORAL_TABLET | Freq: Four times a day (QID) | ORAL | 0 refills | Status: DC | PRN

## 2017-06-12 NOTE — Discharge Instructions (Signed)
Bacterial Vaginosis Bacterial vaginosis is an infection of the vagina. It happens when too many germs (bacteria) grow in the vagina. This infection puts you at risk for infections from sex (STIs). Treating this infection can lower your risk for some STIs. You should also treat this if you are pregnant. It can cause your baby to be born early. Follow these instructions at home: Medicines  Take over-the-counter and prescription medicines only as told by your doctor.  Take or use your antibiotic medicine as told by your doctor. Do not stop taking or using it even if you start to feel better. General instructions  If you your sexual partner is a woman, tell her that you have this infection. She needs to get treatment if she has symptoms. If you have a female partner, he does not need to be treated.  During treatment: ? Avoid sex. ? Do not douche. ? Avoid alcohol as told. ? Avoid breastfeeding as told.  Drink enough fluid to keep your pee (urine) clear or pale yellow.  Keep your vagina and butt (rectum) clean. ? Wash the area with warm water every day. ? Wipe from front to back after you use the toilet.  Keep all follow-up visits as told by your doctor. This is important. Preventing this condition  Do not douche.  Use only warm water to wash around your vagina.  Use protection when you have sex. This includes: ? Latex condoms. ? Dental dams.  Limit how many people you have sex with. It is best to only have sex with the same person (be monogamous).  Get tested for STIs. Have your partner get tested.  Wear underwear that is cotton or lined with cotton.  Avoid tight pants and pantyhose. This is most important in summer.  Do not use any products that have nicotine or tobacco in them. These include cigarettes and e-cigarettes. If you need help quitting, ask your doctor.  Do not use illegal drugs.  Limit how much alcohol you drink. Contact a doctor if:  Your symptoms do not get  better, even after you are treated.  You have more discharge or pain when you pee (urinate).  You have a fever.  You have pain in your belly (abdomen).  You have pain with sex.  Your bleed from your vagina between periods. Summary  This infection happens when too many germs (bacteria) grow in the vagina.  Treating this condition can lower your risk for some infections from sex (STIs).  You should also treat this if you are pregnant. It can cause early (premature) birth.  Do not stop taking or using your antibiotic medicine even if you start to feel better. This information is not intended to replace advice given to you by your health care provider. Make sure you discuss any questions you have with your health care provider. Document Released: 11/14/2007 Document Revised: 10/21/2015 Document Reviewed: 10/21/2015 Elsevier Interactive Patient Education  2017 Elsevier Inc. Pelvic Inflammatory Disease Pelvic inflammatory disease (PID) is an infection in some or all of the female organs. PID can be in the uterus, ovaries, fallopian tubes, or the surrounding tissues that are inside the lower belly area (pelvis). PID can lead to lasting problems if it is not treated. To check for this disease, your doctor may:  Do a physical exam.  Do blood tests, urine tests, or a pregnancy test.  Look at your vaginal discharge.  Do tests to look inside the pelvis.  Test you for other infections.  Follow  these instructions at home:  Take over-the-counter and prescription medicines only as told by your doctor.  If you were prescribed an antibiotic medicine, take it as told by your doctor. Do not stop taking it even if you start to feel better.  Do not have sex until treatment is done or as told by your doctor.  Tell your sex partner if you have PID. Your partner may need to be treated.  Keep all follow-up visits as told by your doctor. This is important.  Your doctor may test you for  infection again 3 months after you are treated. Contact a doctor if:  You have more fluid (discharge) coming from your vagina or fluid that is not normal.  Your pain does not improve.  You throw up (vomit).  You have a fever.  You cannot take your medicines.  Your partner has a sexually transmitted disease (STD).  You have pain when you pee (urinate). Get help right away if:  You have more belly (abdominal) or lower belly pain.  You have chills.  You are not better after 72 hours. This information is not intended to replace advice given to you by your health care provider. Make sure you discuss any questions you have with your health care provider. Document Released: 05/03/2008 Document Revised: 07/13/2015 Document Reviewed: 03/14/2014 Elsevier Interactive Patient Education  [employer]2018 Else[employer] Inc.

## 2017-06-13 LAB — GC/CHLAMYDIA PROBE AMP (~~LOC~~) NOT AT ARMC
CHLAMYDIA, DNA PROBE: NEGATIVE
Neisseria Gonorrhea: NEGATIVE

## 2017-06-20 ENCOUNTER — Ambulatory Visit (HOSPITAL_COMMUNITY)
Admission: RE | Admit: 2017-06-20 | Discharge: 2017-06-20 | Disposition: A | Discharge status: Home / Self Care | Payer: Medicaid Other | Source: Ambulatory Visit | Attending: Advanced Practice Midwife | Admitting: Advanced Practice Midwife

## 2017-06-20 DIAGNOSIS — R102 Pelvic and perineal pain: Secondary | ICD-10-CM | POA: Insufficient documentation

## 2017-06-20 DIAGNOSIS — N83201 Unspecified ovarian cyst, right side: Secondary | ICD-10-CM | POA: Diagnosis not present

## 2017-06-21 ENCOUNTER — Encounter: Payer: Self-pay | Admitting: Advanced Practice Midwife

## 2017-06-21 DIAGNOSIS — N83209 Unspecified ovarian cyst, unspecified side: Secondary | ICD-10-CM | POA: Insufficient documentation

## 2017-09-16 ENCOUNTER — Inpatient Hospital Stay
Admission: RE | Admit: 2017-09-16 | Discharge: 2017-09-16 | Disposition: A | Discharge status: Home / Self Care | Payer: Medicaid Other | Source: Ambulatory Visit | Attending: Primary Care | Admitting: Primary Care

## 2017-09-26 ENCOUNTER — Other Ambulatory Visit: Payer: Medicaid Other

## 2017-10-16 ENCOUNTER — Encounter (HOSPITAL_COMMUNITY): Payer: Self-pay | Admitting: Emergency Medicine

## 2017-10-16 ENCOUNTER — Emergency Department (HOSPITAL_COMMUNITY)
Admission: EM | Admit: 2017-10-16 | Discharge: 2017-10-16 | Disposition: A | Discharge status: Home / Self Care | Payer: Medicaid Other | Attending: Emergency Medicine | Admitting: Emergency Medicine

## 2017-10-16 ENCOUNTER — Emergency Department (HOSPITAL_COMMUNITY): Payer: Medicaid Other

## 2017-10-16 DIAGNOSIS — J029 Acute pharyngitis, unspecified: Secondary | ICD-10-CM | POA: Diagnosis present

## 2017-10-16 DIAGNOSIS — I10 Essential (primary) hypertension: Secondary | ICD-10-CM | POA: Diagnosis not present

## 2017-10-16 DIAGNOSIS — Z79899 Other long term (current) drug therapy: Secondary | ICD-10-CM | POA: Insufficient documentation

## 2017-10-16 DIAGNOSIS — F17210 Nicotine dependence, cigarettes, uncomplicated: Secondary | ICD-10-CM | POA: Diagnosis not present

## 2017-10-16 NOTE — ED Provider Notes (Signed)
[facility]   CSN: [id number] Arrival date & time: 10/16/17  [id number]     History   Chief Complaint Chief Complaint  Patient presents with  . Sore Throat    HPI Misty Walsh is a 27 y.o. female.  27 y.o female with a PMH of HTN, DM is to the ED with a chief complaint of something stuck in my throat x 15 minutes.  She states she was itching eating a quarter pounder from [employer] with a Coke when she began to feel something "cracking" throat.  Now reports difficulty swallowing, she has tried to drink plenty of water states there is something still stuck on her throat.  States the feeling is worse with inspiration causing her to be short of breath.  Denies any chest pain, fever, recent URI symptoms or other complaints.     Past Medical History:  Diagnosis Date  . Anxiety   . Chlamydia   . Depression med made her navel itching and  made her sleepy so she quit taking them  . GERD (gastroesophageal reflux disease)   . History of cesarean section, classical 12/18/2012   2014   . Human papilloma virus   . Hypertension   . Ovarian cyst     Patient Active Problem List   Diagnosis Date Noted  . Hemorrhagic ovarian cyst 06/21/2017  . 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 History of cesarean section, classical 12/18/2012  . Allergy 10/29/2012    Past Surgical History:  Procedure Laterality Date  . BREAST BIOPSY Left 2012   benign fibroadeoma  . BREAST SURGERY    . CESAREAN SECTION N/A 12/13/2012   Procedure: PRIMARY CESAREAN SECTION;  Surgeon: Kathreen CosierBernard A Marshall, MD;  Location: [facility];  Service: Obstetrics;  Laterality: N/A;  . LEEP       OB History    Gravida  1   Para  1   Term      Preterm  1   AB      Living  1     SAB      TAB      Ectopic      Multiple      Live Births               Home Medications    Prior to Admission medications   Medication Sig Start Date End Date  Taking? Authorizing Provider  amLODipine (NORVASC) 10 MG tablet TK 1 T PO ONCE D 03/19/17   [provider]  ibuprofen (ADVIL,MOTRIN) 600 MG tablet Take 1 tablet (600 mg total) by mouth every 6 (six) hours as needed. 06/11/17   Aviva SignsWilliams, Marie L, CNM  metoprolol succinate (TOPROL-XL) 25 MG 24 hr tablet TK 1 T PO QD 03/19/17   [provider]  traMADol (ULTRAM) 50 MG tablet Take 1 tablet (50 mg total) by mouth every 6 (six) hours as needed for moderate pain. 06/12/17   Aviva SignsWilliams, Marie L, CNM    Family History Family History  Problem Relation Age of Onset  . Diabetes Mother   . Heart disease Mother   . Hypertension Mother   . Depression Mother   . Stroke Mother   . Arthritis Mother   . Stroke Father   . Asthma Sister   . Kidney disease Brother        genetic condition  . Diabetes Maternal Aunt   . Hypertension Maternal Aunt   . Asthma Maternal  Aunt   . Epilepsy Maternal Aunt   . Hypertension Maternal Grandmother   . Dementia Maternal Grandmother   . Heart disease Maternal Grandmother   . Diabetes Paternal Grandmother     Social History Social History   Tobacco Use  . Smoking status: Current Every Day Smoker    Packs/day: 0.25    Years: 5.00    Pack years: 1.25    Types: Cigarettes  . Smokeless tobacco: Never Used  Substance Use Topics  . Alcohol use: Yes    Comment: occ  . Drug use: No     Allergies   Macrobid [nitrofurantoin macrocrystal]   Review of Systems Review of Systems  Constitutional: Negative for fever.  HENT: Positive for sore throat. Negative for rhinorrhea.   Respiratory: Positive for shortness of breath. Negative for cough, choking, wheezing and stridor.   Cardiovascular: Negative for chest pain.  All other systems reviewed and are negative.    Physical Exam Updated Vital Signs BP (!) 154/95 (BP Location: Right Wrist)   Pulse 100   Temp 98.8 F (37.1 C) (Oral)   Resp 18   Ht 5\' 3"  (1.6 m)   Wt 131.1 kg   LMP 10/16/2017    SpO2 100%   BMI 51.19 kg/m   Physical Exam  Constitutional: She appears well-developed and well-nourished.  HENT:  Head: Normocephalic and atraumatic.  Mouth/Throat: Uvula is midline, oropharynx is clear and moist and mucous membranes are normal. She does not have dentures. Oral lesions present. No uvula swelling. No oropharyngeal exudate, posterior oropharyngeal edema, posterior oropharyngeal erythema or tonsillar abscesses. No tonsillar exudate.    Ulcer noted to the posterior aspect of her throat.  No swelling, erythema, edema, uvula is midline.  No stridor present.  She is currently satting at 100% on room air.  Eyes: Pupils are equal, round, and reactive to light.  Neck: Normal range of motion. Neck supple.  Cardiovascular: Normal rate.  Pulmonary/Chest: Effort normal and breath sounds normal. No respiratory distress.  Abdominal: Soft.  Skin: Skin is warm and dry.  Nursing note and vitals reviewed.    ED Treatments / Results  Labs (all labs ordered are listed, but only abnormal results are displayed) Labs Reviewed - No data to display  EKG None  Radiology Dg Neck Soft Tissue  Result Date: 10/16/2017 CLINICAL DATA:  27 year old female with pain in the throat worsened on swallowing. EXAM: NECK SOFT TISSUES - 1+ VIEW COMPARISON:  None. FINDINGS: There is no evidence of retropharyngeal soft tissue swelling or epiglottic enlargement. The cervical airway is unremarkable and no radio-opaque foreign body identified. IMPRESSION: Negative. Electronically Signed   By: Elgie Collard M.D.   On: 10/16/2017 22:23    Procedures Procedures (including critical care time)  Medications Ordered in ED Medications - No data to display   Initial Impression / Assessment and Plan / ED Course  I have reviewed the triage vital signs and the nursing notes.  Pertinent labs & imaging results that were available during my care of the patient were reviewed by me and considered in my medical  decision making (see chart for details).     Patient presents with feeling something stuck on her throat after eating a quarter pounder from [employer].  Is able to tolerate liquids and solids after this incident.  Is no stridor, edema, swelling, uvula is midline upon examination.  DG lateral soft tissue showed no soft tissue swelling. No foreign object present. At this time I will discharge patient home.  All stable during ED visit, patient stable for discharge.  I have discussed the results of the x-ray with patient, she states "so I am supposed to go home with my throat feeling like this" at this time I have spoken to patient and advised her that I have ruled out any of the emergencies there is no foreign body obstruction, no swelling along the epiglottis, there is no redness on her tonsils,or exudates, erythema. Symptoms began 15 minutes ago and patient states that she feels there is something wrong.  She is vitals during visit have been reassuring and stable.  Patient is currently in room drinking a Pepsi.  Return precautions provided.  Final Clinical Impressions(s) / ED Diagnoses   Final diagnoses:  Sore throat    ED Discharge Orders    None       Claude Manges, PA-C 10/16/17 2240    Mancel Bale, MD 10/17/17 [phone]

## 2017-10-16 NOTE — ED Triage Notes (Signed)
Pt presents to ED for assessment after she took a deep breath a few minutes ago, felt a "pop" in her throat with some "crackles" and is now having pain in her throat, worse with swallowing.

## 2017-10-16 NOTE — ED Notes (Signed)
This RN went in to swab throat and patient states she is going to have a panic attack if I try to swab her throat.  This RN attempted to reassure patient, patient states she does not want the swab. APP explained rationale for having swab to not delay care.  Patient verbalized understanding and still does not want the swab

## 2017-10-16 NOTE — ED Provider Notes (Signed)
Patient placed in Quick Look pathway, seen and evaluated   Chief Complaint: sore throat  HPI:   Filiberto PinksKeosha S Farabaugh is a 27 y.o. female who presents to the ED for sore throat that started just before she came to the ED. Patient reports taking a deep breath and feeling like something in her throat.   ROS: ENT: sore throat  Physical Exam:  BP (!) 154/95 (BP Location: Right Wrist)   Pulse 100   Temp 98.8 F (37.1 C) (Oral)   Resp 18   Ht 5\' 3"  (1.6 m)   Wt 131.1 kg   LMP 10/16/2017   SpO2 100%   BMI 51.19 kg/m    Gen: No distress  Neuro: Awake and Alert  Skin: Warm and dry  ENT: throat: uvula midline, posterior pharynx with erythema and small ulcer area noted.      Initiation of care has begun. The patient has been counseled on the process, plan, and necessity for staying for the completion/evaluation, and the remainder of the medical screening examination    Janne Napoleoneese, Yicel Shannon M, NP 10/16/17 1946    Terrilee FilesButler, Michael C, MD 10/17/17 [phone]

## 2017-10-16 NOTE — Discharge Instructions (Addendum)
Please follow-up with PCP in 1 week for reevaluation of your symptoms.  If your symptoms worsen or you experience any chest pain, shortness of breath please return to the ED.

## 2018-04-07 ENCOUNTER — Encounter: Payer: Self-pay | Admitting: Neurology

## 2018-05-05 ENCOUNTER — Other Ambulatory Visit (INDEPENDENT_AMBULATORY_CARE_PROVIDER_SITE_OTHER): Payer: Self-pay | Admitting: Primary Care

## 2018-05-28 NOTE — Progress Notes (Deleted)
New Patient Virtual Visit via Video Note The purpose of this virtual visit is to provide medical care while limiting exposure to the novel coronavirus.    Consent was obtained for video visit:  {yes no:314532} Answered questions that patient had about telehealth interaction:  {yes no:314532} I discussed the limitations, risks, security and privacy concerns of performing an evaluation and management service by telemedicine. I also discussed with the patient that there may be a patient responsible charge related to this service. The patient expressed understanding and agreed to proceed.  Pt location: Home Physician Location: office Name of referring provider:  Cline CrockKelly-Coleman, Monica, * I connected with Misty Walsh at patients initiation/request on 06/01/2018 at 11:00 AM EDT by video enabled telemedicine application and verified that I am speaking with the correct person using two identifiers. Pt MRN:  [id number] Pt DOB:  09-Dec-1990 Video Participants:  Misty Walsh;  ***    History of Present Illness: Misty Walsh is a 28 y.o. ***-handed African American female with hypertension and tobacco use presenting for evaluation of right hand paresthesias.  Starting around 2015, she began having right arm numbness which starts in the elbow and radiates into the hand.  She has associated weakness and recently dropped a 37lb child.  She works as a LawyerCNA.    Out-side paper records, electronic medical record, and images have been reviewed where available and summarized as: *** Labs 03/27/2018:  Vitamin B12 465, vitamin D 11.4*, folate 12.7  Lab Results  Component Value Date   HGBA1C 5.1 09/16/2012   No results found for: VITAMINB12 Lab Results  Component Value Date   TSH 1.807 09/05/2012   No results found for: ESRSEDRATE, POCTSEDRATE  Past Medical History:  Diagnosis Date  . Anxiety   . Chlamydia   . Depression med made her navel itching and  made her sleepy so she quit taking them   . GERD (gastroesophageal reflux disease)   . History of cesarean section, classical 12/18/2012   2014   . Human papilloma virus   . Hypertension   . Ovarian cyst     Past Surgical History:  Procedure Laterality Date  . BREAST BIOPSY Left 2012   benign fibroadeoma  . BREAST SURGERY    . CESAREAN SECTION N/A 12/13/2012   Procedure: PRIMARY CESAREAN SECTION;  Surgeon: Kathreen CosierBernard A Marshall, MD;  Location: [facility];  Service: Obstetrics;  Laterality: N/A;  . LEEP       Medications:  Outpatient Encounter Medications as of 06/01/2018  Medication Sig  . amLODipine (NORVASC) 10 MG tablet TK 1 T PO ONCE D  . ibuprofen (ADVIL,MOTRIN) 600 MG tablet Take 1 tablet (600 mg total) by mouth every 6 (six) hours as needed.  . metoprolol succinate (TOPROL-XL) 25 MG 24 hr tablet TK 1 T PO QD  . traMADol (ULTRAM) 50 MG tablet Take 1 tablet (50 mg total) by mouth every 6 (six) hours as needed for moderate pain.   No facility-administered encounter medications on file as of 06/01/2018.     Allergies:  Allergies  Allergen Reactions  . Macrobid [Nitrofurantoin Macrocrystal] Itching    Caused the patient to feel hot.    Family History: Family History  Problem Relation Age of Onset  . Diabetes Mother   . Heart disease Mother   . Hypertension Mother   . Depression Mother   . Stroke Mother   . Arthritis Mother   . Stroke Father   . Asthma Sister   .  Kidney disease Brother        genetic condition  . Diabetes Maternal Aunt   . Hypertension Maternal Aunt   . Asthma Maternal Aunt   . Epilepsy Maternal Aunt   . Hypertension Maternal Grandmother   . Dementia Maternal Grandmother   . Heart disease Maternal Grandmother   . Diabetes Paternal Grandmother     Social History: Social History   Tobacco Use  . Smoking status: Current Every Day Smoker    Packs/day: 0.25    Years: 5.00    Pack years: 1.25    Types: Cigarettes  . Smokeless tobacco: Never Used  Substance Use Topics  . Alcohol use:  Yes    Comment: occ  . Drug use: No   Social History   Social History Narrative  . Not on file    Review of Systems:  CONSTITUTIONAL: No fevers, chills, night sweats, or weight loss.  *** EYES: No visual changes or eye pain ENT: No hearing changes.  No history of nose bleeds.   RESPIRATORY: No cough, wheezing and shortness of breath.   CARDIOVASCULAR: Negative for chest pain, and palpitations.   GI: Negative for abdominal discomfort, blood in stools or black stools.  No recent change in bowel habits.   GU:  No history of incontinence.   MUSCLOSKELETAL: No history of joint pain or swelling.  No myalgias.   SKIN: Negative for lesions, rash, and itching.   HEMATOLOGY/ONCOLOGY: Negative for prolonged bleeding, bruising easily, and swollen nodes.  No history of cancer.   ENDOCRINE: Negative for cold or heat intolerance, polydipsia or goiter.   PSYCH:  ***depression or anxiety symptoms.   NEURO: As Above.   Vital Signs:  There were no vitals taken for this visit.   General Medical Exam:  Well appearing, comfortable.  Nonlabored breathing.  No deformity or edema.  No rash.  Neurological Exam: MENTAL STATUS including orientation to time, place, person, recent and remote memory, attention span and concentration, language, and fund of knowledge is ***normal.  Speech is not dysarthric.  CRANIAL NERVES:  Normal conjugate, extra-ocular eye movements in all directions of gaze.  No ptosis***.  Normal facial symmetry and movements.  Normal shoulder shrug and head rotation.  Tongue is midline.  MOTOR:  Antigravity in all extremities.  No abnormal movements.  No pronator drift. ***   SENSORY: ***  Romberg's sign absent.   COORDINATION/GAIT: Normal finger to nose bilaterally.  Intact rapid alternating movements bilaterally.  Able to rise from a chair without using arms.  Gait narrow based and stable. Tandem and stressed gait intact. ***   IMPRESSION/PLAN: ***  - NCS/EMG right hand to  evaluate for ulnar neuropathy  - Strategies to minimize nerve compression at the elbow discussed, such as avoiding hyperflexion at the elbow and using a soft elbow pad    Follow Up Instructions:  I discussed the assessment and treatment plan with the patient. The patient was provided an opportunity to ask questions and all were answered. The patient agreed with the plan and demonstrated an understanding of the instructions.   The patient was advised to call back or seek an in-person evaluation if the symptoms worsen or if the condition fails to improve as anticipated.  Return to clinic in ***  Total Time spent in visit with the patient was:  ***, of which more than 50% of the time was spent in counseling and/or coordinating care.   Pt understands and agrees with the plan of care outlined.  Alda Berthold, DO

## 2018-06-01 ENCOUNTER — Encounter: Payer: Self-pay | Admitting: *Deleted

## 2018-06-01 ENCOUNTER — Telehealth: Payer: Medicaid Other | Admitting: Neurology

## 2018-06-01 ENCOUNTER — Other Ambulatory Visit: Payer: Self-pay

## 2018-06-09 ENCOUNTER — Telehealth: Payer: Medicaid Other | Admitting: Neurology

## 2018-06-12 ENCOUNTER — Ambulatory Visit: Payer: Medicaid Other | Admitting: Neurology

## 2018-08-12 ENCOUNTER — Ambulatory Visit: Payer: Medicaid Other | Admitting: Neurology

## 2019-03-22 DIAGNOSIS — I10 Essential (primary) hypertension: Secondary | ICD-10-CM | POA: Insufficient documentation

## 2019-05-18 ENCOUNTER — Other Ambulatory Visit: Payer: Self-pay | Admitting: Family Medicine

## 2019-06-08 ENCOUNTER — Other Ambulatory Visit: Payer: Self-pay | Admitting: Advanced Practice Midwife

## 2019-06-15 ENCOUNTER — Other Ambulatory Visit: Payer: Self-pay

## 2019-06-15 ENCOUNTER — Ambulatory Visit (HOSPITAL_COMMUNITY)
Admission: EM | Admit: 2019-06-15 | Discharge: 2019-06-15 | Disposition: A | Discharge status: Home / Self Care | Payer: Medicaid Other

## 2019-06-15 ENCOUNTER — Ambulatory Visit (HOSPITAL_COMMUNITY)
Admission: EM | Admit: 2019-06-15 | Discharge: 2019-06-15 | Disposition: A | Discharge status: Home / Self Care | Payer: Medicaid Other | Attending: Family Medicine | Admitting: Family Medicine

## 2019-06-15 DIAGNOSIS — Z20822 Contact with and (suspected) exposure to covid-19: Secondary | ICD-10-CM | POA: Diagnosis not present

## 2019-06-15 DIAGNOSIS — Z9109 Other allergy status, other than to drugs and biological substances: Secondary | ICD-10-CM | POA: Diagnosis present

## 2019-06-15 DIAGNOSIS — J3489 Other specified disorders of nose and nasal sinuses: Secondary | ICD-10-CM | POA: Insufficient documentation

## 2019-06-15 NOTE — Discharge Instructions (Addendum)
Go home to rest Drink plenty of fluids Take Tylenol for pain or fever You may take over-the-counter cough and cold medicines as needed You must quarantine at home until your test result is available You can check for your test result in MyChart CALL FOR PROBLEMS May need to retest if you develop symptoms

## 2019-06-15 NOTE — ED Triage Notes (Signed)
Pt requesting COVID test, exposed yesterday at work

## 2019-06-15 NOTE — ED Provider Notes (Signed)
[facility]    CSN: [id number] Arrival date & time: 06/15/19  1827      History   Chief Complaint Chief Complaint  Patient presents with  . Covid Exposure    HPI Misty Walsh is a 29 y.o. female.   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  She states that that person is in the hospital.  She has no symptoms except runny nose.  She is had this for several days and feels like it is from her allergies.  She states that she provided care to this person yesterday and although she were mass the patient was not always able to wear there is.  She feels that she had a close exposure to this patient. She was notified by her employer to come get a Covid test today.  I informed her that it may be too soon to be Covid testing her.  In addition if she has had close exposure to Covid she needs to quarantine for longer than 24 hours.  Past Medical History:  Diagnosis Date  . Anxiety   . Chlamydia   . Depression med made her navel itching and  made her sleepy so she quit taking them  . GERD (gastroesophageal reflux disease)   . History of cesarean section, classical 12/18/2012   2014   . Human papilloma virus   . Hypertension   . Ovarian cyst     Patient Active Problem List   Diagnosis Date Noted  . Hemorrhagic ovarian cyst 06/21/2017  . 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 History of cesarean section, classical 12/18/2012  . Allergy 10/29/2012    Past Surgical History:  Procedure Laterality Date  . BREAST BIOPSY Left 2012   benign fibroadeoma  . BREAST SURGERY    . CESAREAN SECTION N/A 12/13/2012   Procedure: PRIMARY CESAREAN SECTION;  Surgeon: Kathreen Cosier, MD;  Location: [facility];  Service: Obstetrics;  Laterality: N/A;  . LEEP      OB History    Gravida  1   Para  1   Term      Preterm  1   AB      Living  1     SAB      TAB      Ectopic      Multiple      Live Births               Home Medications    Prior to Admission medications   Medication Sig Start Date End Date Taking? Authorizing Provider  amLODipine (NORVASC) 10 MG tablet TK 1 T PO ONCE D 03/19/17   [provider]  atorvastatin (LIPITOR) 20 MG tablet TK 1 T PO ONCE D HS 05/06/18   [provider]  cetirizine (ZYRTEC) 10 MG tablet TAKE 1 TABLET BY MOUTH ONCE DAILY FOR 30 DAYS 03/31/18   [provider]  HYDROcodone-acetaminophen (NORCO/VICODIN) 5-325 MG tablet Take by mouth. 08/10/13   [provider]  ibuprofen (ADVIL,MOTRIN) 600 MG tablet Take 1 tablet (600 mg total) by mouth every 6 (six) hours as needed. 06/11/17   Aviva Signs, CNM  lisinopril-hydrochlorothiazide (PRINZIDE,ZESTORETIC) 10-12.5 MG tablet TAKE 1 TABLET BY MOUTH ONCE DAILY FOR 30 DAYS 04/02/18   [provider]  metoprolol succinate (TOPROL-XL) 25 MG 24 hr tablet TK 1 T PO QD 03/19/17  [provider]  traMADol (ULTRAM) 50 MG tablet Take 1 tablet (50 mg total) by mouth every 6 (six) hours as needed for moderate pain. 06/12/17   Aviva Signs, CNM  Vitamin D, Ergocalciferol, (DRISDOL) 1.25 MG (50000 UT) CAPS capsule TK ONE C PO WEEKLY 05/06/18   [provider]    Family History Family History  Problem Relation Age of Onset  . Diabetes Mother   . Heart disease Mother   . Hypertension Mother   . Depression Mother   . Stroke Mother   . Arthritis Mother   . Stroke Father   . Asthma Sister   . Kidney disease Brother        genetic condition  . Diabetes Maternal Aunt   . Hypertension Maternal Aunt   . Asthma Maternal Aunt   . Epilepsy Maternal Aunt   . Hypertension Maternal Grandmother   . Dementia Maternal Grandmother   . Heart disease Maternal Grandmother   . Diabetes Paternal Grandmother     Social History Social History   Tobacco Use  . Smoking status: Current Every Day Smoker    Packs/day: 0.25     Years: 5.00    Pack years: 1.25    Types: Cigarettes  . Smokeless tobacco: Never Used  Substance Use Topics  . Alcohol use: Yes    Comment: occ  . Drug use: No     Allergies   Macrobid [nitrofurantoin macrocrystal]   Review of Systems Review of Systems  HENT: Positive for postnasal drip and rhinorrhea.   Patient states from allergies   Physical Exam Triage Vital Signs ED Triage Vitals  Enc Vitals Group     BP 06/15/19 1915 (!) 157/110     Pulse Rate 06/15/19 1914 81     Resp 06/15/19 1914 16     Temp 06/15/19 1914 (!) 97.5 F (36.4 C)     Temp src --      SpO2 06/15/19 1914 97 %     Weight --      Height --      Head Circumference --      Peak Flow --      Pain Score 06/15/19 1914 0     Pain Loc --      Pain Edu? --      Excl. in GC? --    No data found.  Updated Vital Signs BP (!) 157/110   Pulse 81   Temp (!) 97.5 F (36.4 C)   Resp 16   LMP 06/06/2019   SpO2 97%  Patient advised that her blood pressure is high    Physical Exam Constitutional:      General: She is not in acute distress.    Appearance: She is well-developed. She is obese.  HENT:     Head: Normocephalic and atraumatic.     Nose: Rhinorrhea present.     Comments: Clear rhinorrhea    Mouth/Throat:     Comments: Mask in place Eyes:     Conjunctiva/sclera: Conjunctivae normal.     Pupils: Pupils are equal, round, and reactive to light.  Cardiovascular:     Rate and Rhythm: Normal rate.  Pulmonary:     Effort: Pulmonary effort is normal. No respiratory distress.  Musculoskeletal:        General: Normal range of motion.     Cervical back: Normal range of motion.  Skin:    General: Skin is warm and dry.  Neurological:     Mental  Status: She is alert.  Psychiatric:        Mood and Affect: Mood normal.      UC Treatments / Results  Labs (all labs ordered are listed, but only abnormal results are displayed) Labs Reviewed  SARS CORONAVIRUS 2 (TAT 6-24 HRS)     EKG   Radiology No results found.  Procedures Procedures (including critical care time)  Medications Ordered in UC Medications - No data to display  Initial Impression / Assessment and Plan / UC Course  I have reviewed the triage vital signs and the nursing notes.  Pertinent labs & imaging results that were available during my care of the patient were reviewed by me and considered in my medical decision making (see chart for details).     *Reviewed CDC guidelines for quarantine Final Clinical Impressions(s) / UC Diagnoses   Final diagnoses:  Close exposure to COVID-19 virus  Rhinorrhea  Environmental allergies     Discharge Instructions     Go home to rest Drink plenty of fluids Take Tylenol for pain or fever You may take over-the-counter cough and cold medicines as needed You must quarantine at home until your test result is available You can check for your test result in MyChart CALL FOR PROBLEMS May need to retest if you develop symptoms     ED Prescriptions    None     PDMP not reviewed this encounter.   Raylene Everts, MD 06/15/19 2052

## 2019-06-16 LAB — SARS CORONAVIRUS 2 (TAT 6-24 HRS): SARS Coronavirus 2: NEGATIVE

## 2019-06-21 ENCOUNTER — Ambulatory Visit (HOSPITAL_COMMUNITY)
Admission: EM | Admit: 2019-06-21 | Discharge: 2019-06-21 | Disposition: A | Discharge status: Home / Self Care | Payer: Medicaid Other | Attending: Family Medicine | Admitting: Family Medicine

## 2019-06-21 ENCOUNTER — Other Ambulatory Visit: Payer: Self-pay

## 2019-06-21 DIAGNOSIS — Z20822 Contact with and (suspected) exposure to covid-19: Secondary | ICD-10-CM | POA: Diagnosis present

## 2019-06-21 NOTE — Discharge Instructions (Signed)
If your Covid-19 test is positive, you will receive a phone call from [place] regarding your results. Negative test results are not called. Both positive and negative results area always visible on MyChart. If you do not have a MyChart account, sign up instructions are in your discharge papers.   Persons who are directed to care for themselves at home may discontinue isolation under the following conditions:   At least 10 days have passed since symptom onset and  At least 24 hours have passed without running a fever (this means without the use of fever-reducing medications) and  Other symptoms have improved.  Persons infected with COVID-19 who never develop symptoms may discontinue isolation and other precautions 10 days after the date of their first positive COVID-19 test.  

## 2019-06-21 NOTE — ED Triage Notes (Signed)
Pt requesting repeat COVID test after exposure at work.

## 2019-06-21 NOTE — ED Provider Notes (Signed)
[place]    CSN: [id number] Arrival date & time: 06/21/19  1836      History   Chief Complaint Chief Complaint  Patient presents with  . Covid Exposure    HPI Misty Walsh is a 29 y.o. female.   Patient reports to urgent care for Covid testing.  She reports she was exposed to Covid positive person 1 week ago today.  She reports over that time she has had some nasal congestion and was seen on 06/15/2019 tested for Covid and was negative and treated for allergies.  She has had great response in her congestion since starting the allergy medicine.  Her work is requiring another Covid test as they were not comfortable with the previous result and work note.  She is otherwise denying any symptoms of cough, headache, fever, chills, nausea, vomiting, diarrhea, change in taste or smell.     Past Medical History:  Diagnosis Date  . Anxiety   . Chlamydia   . Depression med made her navel itching and  made her sleepy so she quit taking them  . GERD (gastroesophageal reflux disease)   . History of cesarean section, classical 12/18/2012   2014   . Human papilloma virus   . Hypertension   . Ovarian cyst     Patient Active Problem List   Diagnosis Date Noted  . Hemorrhagic ovarian cyst 06/21/2017  . 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 History of cesarean section, classical 12/18/2012  . Allergy 10/29/2012    Past Surgical History:  Procedure Laterality Date  . BREAST BIOPSY Left 2012   benign fibroadeoma  . BREAST SURGERY    . CESAREAN SECTION N/A 12/13/2012   Procedure: PRIMARY CESAREAN SECTION;  Surgeon: Frederico Hamman, MD;  Location: [facility];  Service: Obstetrics;  Laterality: N/A;  . LEEP      OB History    Gravida  1   Para  1   Term      Preterm  1   AB      Living  1     SAB      TAB      Ectopic      Multiple      Live Births               Home Medications    Prior to Admission medications   Medication  Sig Start Date End Date Taking? Authorizing Provider  amLODipine (NORVASC) 10 MG tablet TK 1 T PO ONCE D 03/19/17   [provider]  atorvastatin (LIPITOR) 20 MG tablet TK 1 T PO ONCE D HS 05/06/18   [provider]  cetirizine (ZYRTEC) 10 MG tablet TAKE 1 TABLET BY MOUTH ONCE DAILY FOR 30 DAYS 03/31/18   [provider]  HYDROcodone-acetaminophen (NORCO/VICODIN) 5-325 MG tablet Take by mouth. 08/10/13   [provider]  ibuprofen (ADVIL,MOTRIN) 600 MG tablet Take 1 tablet (600 mg total) by mouth every 6 (six) hours as needed. 06/11/17   Seabron Spates, CNM  lisinopril-hydrochlorothiazide (PRINZIDE,ZESTORETIC) 10-12.5 MG tablet TAKE 1 TABLET BY MOUTH ONCE DAILY FOR 30 DAYS 04/02/18   [provider]  metoprolol succinate (TOPROL-XL) 25 MG 24 hr tablet TK 1 T PO QD 03/19/17   [provider]  traMADol (ULTRAM) 50 MG tablet Take 1 tablet (50 mg total) by mouth every 6 (six) hours as needed for moderate pain. 06/12/17   Seabron Spates, CNM  Vitamin D, Ergocalciferol, (  DRISDOL) 1.25 MG (50000 UT) CAPS capsule TK ONE C PO WEEKLY 05/06/18   [provider]    Family History Family History  Problem Relation Age of Onset  . Diabetes Mother   . Heart disease Mother   . Hypertension Mother   . Depression Mother   . Stroke Mother   . Arthritis Mother   . Stroke Father   . Asthma Sister   . Kidney disease Brother        genetic condition  . Diabetes Maternal Aunt   . Hypertension Maternal Aunt   . Asthma Maternal Aunt   . Epilepsy Maternal Aunt   . Hypertension Maternal Grandmother   . Dementia Maternal Grandmother   . Heart disease Maternal Grandmother   . Diabetes Paternal Grandmother     Social History Social History   Tobacco Use  . Smoking status: Current Every Day Smoker    Packs/day: 0.25    Years: 5.00    Pack years: 1.25    Types: Cigarettes  . Smokeless tobacco: Never Used  Substance Use Topics  . Alcohol use: Yes     Comment: occ  . Drug use: No     Allergies   Macrobid [nitrofurantoin macrocrystal]   Review of Systems Review of Systems Per HPI Physical Exam Triage Vital Signs ED Triage Vitals [06/21/19 1908]  Enc Vitals Group     BP (!) 158/90     Pulse Rate 87     Resp 16     Temp 98 F (36.7 C)     Temp src      SpO2 97 %     Weight      Height      Head Circumference      Peak Flow      Pain Score 0     Pain Loc      Pain Edu?      Excl. in GC?    No data found.  Updated Vital Signs BP (!) 158/90   Pulse 87   Temp 98 F (36.7 C)   Resp 16   LMP 06/06/2019   SpO2 97%   Visual Acuity Right Eye Distance:   Left Eye Distance:   Bilateral Distance:    Right Eye Near:   Left Eye Near:    Bilateral Near:     Physical Exam Vitals and nursing note reviewed.  Constitutional:      General: She is not in acute distress.    Appearance: She is well-developed. She is not ill-appearing.  HENT:     Head: Normocephalic and atraumatic.  Cardiovascular:     Rate and Rhythm: Normal rate.  Pulmonary:     Effort: Pulmonary effort is normal. No respiratory distress.  Skin:    General: Skin is warm and dry.  Neurological:     Mental Status: She is alert.      UC Treatments / Results  Labs (all labs ordered are listed, but only abnormal results are displayed) Labs Reviewed  SARS CORONAVIRUS 2 (TAT 6-24 HRS)    EKG   Radiology No results found.  Procedures Procedures (including critical care time)  Medications Ordered in UC Medications - No data to display  Initial Impression / Assessment and Plan / UC Course  I have reviewed the triage vital signs and the nursing notes.  Pertinent labs & imaging results that were available during my care of the patient were reviewed by me and considered in my medical decision making (  see chart for details).     #Covid exposure Patient is 29 year old female presenting for Covid testing following Covid exposure.  She  is currently asymptomatic in clinic.  She did have recent symptoms consistent with seasonal allergies and given response to allergy medicine I concur seasonal allergy.  Covid PCR was sent.  Discussed that if she has a negative Covid test I do feel will be safe for return to work.  Patient verbalized understanding. Final Clinical Impressions(s) / UC Diagnoses   Final diagnoses:  Exposure to COVID-19 virus     Discharge Instructions      If your Covid-19 test is positive, you will receive a phone call from [facility] regarding your results. Negative test results are not called. Both positive and negative results area always visible on MyChart. If you do not have a MyChart account, sign up instructions are in your discharge papers.   Persons who are directed to care for themselves at home may discontinue isolation under the following conditions:  . At least 10 days have passed since symptom onset and . At least 24 hours have passed without running a fever (this means without the use of fever-reducing medications) and . Other symptoms have improved.  Persons infected with COVID-19 who never develop symptoms may discontinue isolation and other precautions 10 days after the date of their first positive COVID-19 test.     ED Prescriptions    None     PDMP not reviewed this encounter.   Hermelinda Medicus, PA-C 06/22/19 [phone]

## 2019-06-22 LAB — SARS CORONAVIRUS 2 (TAT 6-24 HRS): SARS Coronavirus 2: NEGATIVE

## 2019-09-13 ENCOUNTER — Encounter: Payer: Self-pay | Admitting: *Deleted

## 2019-09-14 ENCOUNTER — Ambulatory Visit: Payer: Medicaid Other | Admitting: Diagnostic Neuroimaging

## 2019-09-14 ENCOUNTER — Other Ambulatory Visit: Payer: Self-pay

## 2019-09-14 ENCOUNTER — Encounter: Payer: Self-pay | Admitting: Diagnostic Neuroimaging

## 2019-09-14 VITALS — BP 160/103 | HR 88 | Ht 64.0 in | Wt 293.2 lb

## 2019-09-14 DIAGNOSIS — M79602 Pain in left arm: Secondary | ICD-10-CM | POA: Diagnosis not present

## 2019-09-14 DIAGNOSIS — R200 Anesthesia of skin: Secondary | ICD-10-CM | POA: Diagnosis not present

## 2019-09-14 DIAGNOSIS — M542 Cervicalgia: Secondary | ICD-10-CM | POA: Diagnosis not present

## 2019-09-14 DIAGNOSIS — M79601 Pain in right arm: Secondary | ICD-10-CM | POA: Diagnosis not present

## 2019-09-14 NOTE — Progress Notes (Signed)
[facility]  PATIENT: PEMA THOMURE DOB: 15-Aug-1990  REFERRING CLINICIAN: [facility]* HISTORY FROM: patient  REASON FOR VISIT: new consult    HISTORICAL  CHIEF COMPLAINT:  Chief Complaint  Patient presents with   Numbness and tingling in hands    rm 7 New Pt "all down my arms and hands and in my left foot for the past year"    HISTORY OF PRESENT ILLNESS:   29 year old female here for evaluation of numbness and pain.  Patient reports bilateral upper extremity numbness, muscle pain, weakness, fluctuating and intermittent since 2020.  She also has some numbness and tingling in her left greater than right leg.  No triggering or aggravating factors.  No accidents injuries or traumas.   REVIEW OF SYSTEMS: Full 14 system review of systems performed and negative with exception of: As per HPI.  ALLERGIES: Allergies  Allergen Reactions   Macrobid [Nitrofurantoin Macrocrystal] Itching    Caused the patient to feel hot.    HOME MEDICATIONS: Outpatient Medications Prior to Visit  Medication Sig Dispense Refill   atorvastatin (LIPITOR) 20 MG tablet TK 1 T PO ONCE D HS     cetirizine (ZYRTEC) 10 MG tablet TAKE 1 TABLET BY MOUTH ONCE DAILY FOR 30 DAYS     amLODipine (NORVASC) 10 MG tablet TK 1 T PO ONCE D (Patient not taking: Reported on 09/14/2019)  0   HYDROcodone-acetaminophen (NORCO/VICODIN) 5-325 MG tablet Take by mouth. (Patient not taking: Reported on 09/14/2019)     ibuprofen (ADVIL,MOTRIN) 600 MG tablet Take 1 tablet (600 mg total) by mouth every 6 (six) hours as needed. (Patient not taking: Reported on 09/14/2019) 30 tablet 1   lisinopril-hydrochlorothiazide (PRINZIDE,ZESTORETIC) 10-12.5 MG tablet TAKE 1 TABLET BY MOUTH ONCE DAILY FOR 30 DAYS (Patient not taking: Reported on 09/14/2019)     metoprolol succinate (TOPROL-XL) 25 MG 24 hr tablet TK 1 T PO QD (Patient not taking: Reported on 09/14/2019)  0   traMADol (ULTRAM) 50 MG tablet  Take 1 tablet (50 mg total) by mouth every 6 (six) hours as needed for moderate pain. (Patient not taking: Reported on 09/14/2019) 10 tablet 0   Vitamin D, Ergocalciferol, (DRISDOL) 1.25 MG (50000 UT) CAPS capsule TK ONE C PO WEEKLY (Patient not taking: Reported on 09/14/2019)     No facility-administered medications prior to visit.    PAST MEDICAL HISTORY: Past Medical History:  Diagnosis Date   Anxiety    Chlamydia    Depression med made her navel itching and  made her sleepy so she quit taking them   Elevated cholesterol    GERD (gastroesophageal reflux disease)    History of cesarean section, classical 12/18/2012   2014    Human papilloma virus    Hypertension    Numbness    right arm to hand   Ovarian cyst     PAST SURGICAL HISTORY: Past Surgical History:  Procedure Laterality Date   BREAST BIOPSY Left 2012   benign fibroadeoma   BREAST SURGERY     CESAREAN SECTION N/A 12/13/2012   Procedure: PRIMARY CESAREAN SECTION;  Surgeon: Kathreen Cosier, MD;  Location: [facility];  Service: Obstetrics;  Laterality: N/A;   LEEP      FAMILY HISTORY: Family History  Problem Relation Age of Onset   Diabetes Mother    Heart disease Mother    Hypertension Mother    Depression Mother    Stroke Mother    Arthritis Mother  Stroke Father    Asthma Sister    Kidney disease Brother        genetic condition   Diabetes Maternal Aunt    Hypertension Maternal Aunt    Asthma Maternal Aunt    Epilepsy Maternal Aunt    Hypertension Maternal Grandmother    Dementia Maternal Grandmother    Heart disease Maternal Grandmother    Diabetes Paternal Grandmother     SOCIAL HISTORY: Social History   Socioeconomic History   Marital status: Single    Spouse name: Not on file   Number of children: 1   Years of education: Not on file   Highest education level: 11th grade  Occupational History    Comment: at home  Tobacco Use   Smoking status:  Current Every Day Smoker    Packs/day: 0.25    Years: 5.00    Pack years: 1.25    Types: Cigarettes   Smokeless tobacco: Never Used  Building services engineer Use: Never used  Substance and Sexual Activity   Alcohol use: Yes    Comment: occ   Drug use: No   Sexual activity: Yes    Birth control/protection: None  Other Topics Concern   Not on file  Social History Narrative   Lives with her child   Caffeine- green tea, Poweraid, grape juice   Social Determinants of Health   Financial Resource Strain:    Difficulty of Paying Living Expenses:   Food Insecurity:    Worried About Programme researcher, broadcasting/film/video in the Last Year:    Barista in the Last Year:   Transportation Needs:    Freight forwarder (Medical):    Lack of Transportation (Non-Medical):   Physical Activity:    Days of Exercise per Week:    Minutes of Exercise per Session:   Stress:    Feeling of Stress :   Social Connections:    Frequency of Communication with Friends and Family:    Frequency of Social Gatherings with Friends and Family:    Attends Religious Services:    Active Member of Clubs or Organizations:    Attends Engineer, structural:    Marital Status:   Intimate Partner Violence:    Fear of Current or Ex-Partner:    Emotionally Abused:    Physically Abused:    Sexually Abused:      PHYSICAL EXAM  GENERAL EXAM/CONSTITUTIONAL: Vitals:  Vitals:   09/14/19 1454  BP: (!) 160/103  Pulse: 88  Weight: (!) 293 lb 3.2 oz (133 kg)  Height: 5\' 4"  (1.626 m)     Body mass index is 50.33 kg/m. Wt Readings from Last 3 Encounters:  09/14/19 (!) 293 lb 3.2 oz (133 kg)  10/16/17 289 lb (131.1 kg)  06/11/17 294 lb (133.4 kg)     Patient is in no distress; well developed, nourished and groomed; neck is supple  CARDIOVASCULAR:  Examination of carotid arteries is normal; no carotid bruits  Regular rate and rhythm, no murmurs  Examination of peripheral vascular  system by observation and palpation is normal  EYES:  Ophthalmoscopic exam of optic discs and posterior segments is normal; no papilledema or hemorrhages  No exam data present  MUSCULOSKELETAL:  Gait, strength, tone, movements noted in Neurologic exam below  NEUROLOGIC: MENTAL STATUS:  No flowsheet data found.  awake, alert, oriented to person, place and time  recent and remote memory intact  normal attention and concentration  language fluent, comprehension intact,  naming intact  fund of knowledge appropriate  CRANIAL NERVE:   2nd - no papilledema on fundoscopic exam  2nd, 3rd, 4th, 6th - pupils equal and reactive to light, visual fields full to confrontation, extraocular muscles intact, no nystagmus  5th - facial sensation symmetric  7th - facial strength symmetric  8th - hearing intact  9th - palate elevates symmetrically, uvula midline  11th - shoulder shrug symmetric  12th - tongue protrusion midline  MOTOR:   normal bulk and tone, full strength in the BUE, BLE  SENSORY:   normal and symmetric to light touch, temperature, vibration  COORDINATION:   finger-nose-finger, fine finger movements normal  REFLEXES:   deep tendon reflexes TRACE and symmetric  GAIT/STATION:   narrow based gait     DIAGNOSTIC DATA (LABS, IMAGING, TESTING) - I reviewed patient records, labs, notes, testing and imaging myself where available.  Lab Results  Component Value Date   WBC 7.8 06/11/2017   HGB 12.9 06/11/2017   HCT 38.2 06/11/2017   MCV 93.6 06/11/2017   PLT 244 06/11/2017      Component Value Date/Time   NA 138 06/11/2017 2337   K 3.4 (L) 06/11/2017 2337   CL 105 06/11/2017 2337   CO2 22 06/11/2017 2337   GLUCOSE 149 (H) 06/11/2017 2337   BUN 9 06/11/2017 2337   CREATININE 0.92 06/11/2017 2337   CALCIUM 9.0 06/11/2017 2337   PROT 7.3 06/11/2017 2337   ALBUMIN 4.1 06/11/2017 2337   AST 27 06/11/2017 2337   ALT 22 06/11/2017 2337   ALKPHOS  93 06/11/2017 2337   BILITOT 0.3 06/11/2017 2337   GFRNONAA >60 06/11/2017 2337   GFRAA >60 06/11/2017 2337   No results found for: CHOL, HDL, LDLCALC, LDLDIRECT, TRIG, CHOLHDL Lab Results  Component Value Date   HGBA1C 5.1 09/16/2012   No results found for: [id number] Lab Results  Component Value Date   TSH 1.807 09/05/2012       ASSESSMENT AND PLAN  29 y.o. year old female here with numbness, pain, weakness since 2020.  Dx:  1. Pain in both upper extremities   2. Left leg numbness   3. Cervicalgia     PLAN:  INTERMITTENT BILATERAL ARM MUSCLE PAIN / WEAKNESS / NUMBNESS / NECK PAIN (since 2020) - check MRI cervical spine - check labs - consider PT/OT evaluation  Orders Placed This Encounter  Procedures   MR CERVICAL SPINE W WO CONTRAST   CBC with Differential/Platelet   Comprehensive metabolic panel   Vitamin B12   ANA,IFA RA Diag Pnl w/rflx Tit/Patn   CK   Aldolase   TSH   Return for pending if symptoms worsen or fail to improve.    Suanne Marker, MD 09/14/2019, 3:23 PM Certified in Neurology, Neurophysiology and Neuroimaging  [facility] [address] [place], [place] [address] [phone]

## 2019-09-14 NOTE — Patient Instructions (Signed)
INTERMITTENT BILATERAL ARM MUSCLE PAIN / WEAKNESS / NUMBNESS / NECK PAIN (since 2020) - check MRI cervical spine - check labs - consider PT/OT evaluation

## 2019-09-15 ENCOUNTER — Telehealth: Payer: Self-pay | Admitting: Diagnostic Neuroimaging

## 2019-09-15 NOTE — Telephone Encounter (Signed)
[facility] no auth order sent to GI. They will reach out to the patient to schedule.

## 2019-09-16 ENCOUNTER — Telehealth: Payer: Self-pay | Admitting: *Deleted

## 2019-09-16 LAB — COMPREHENSIVE METABOLIC PANEL
ALT: 17 IU/L (ref 0–32)
AST: 15 IU/L (ref 0–40)
Albumin/Globulin Ratio: 1.9 (ref 1.2–2.2)
Albumin: 4.5 g/dL (ref 3.9–5.0)
Alkaline Phosphatase: 100 IU/L (ref 48–121)
BUN/Creatinine Ratio: 10 (ref 9–23)
BUN: 9 mg/dL (ref 6–20)
Bilirubin Total: <0.2 mg/dL (ref 0.0–1.2)
CO2: 21 mmol/L (ref 20–29)
Calcium: 9.5 mg/dL (ref 8.7–10.2)
Chloride: 106 mmol/L (ref 96–106)
Creatinine, Ser: 0.9 mg/dL (ref 0.57–1.00)
GFR calc Af Amer: 100 mL/min/{1.73_m2} (ref >59)
GFR calc non Af Amer: 87 mL/min/{1.73_m2} (ref >59)
Globulin, Total: 2.4 g/dL (ref 1.5–4.5)
Glucose: 89 mg/dL (ref 65–99)
Potassium: 4.3 mmol/L (ref 3.5–5.2)
Sodium: 139 mmol/L (ref 134–144)
Total Protein: 6.9 g/dL (ref 6.0–8.5)

## 2019-09-16 LAB — CBC WITH DIFFERENTIAL/PLATELET
Basophils Absolute: 0 10*3/uL (ref 0.0–0.2)
Basos: 1 %
EOS (ABSOLUTE): 0.2 10*3/uL (ref 0.0–0.4)
Eos: 2 %
Hematocrit: 39.9 % (ref 34.0–46.6)
Hemoglobin: 13.8 g/dL (ref 11.1–15.9)
Immature Grans (Abs): 0 10*3/uL (ref 0.0–0.1)
Immature Granulocytes: 0 %
Lymphocytes Absolute: 2.9 10*3/uL (ref 0.7–3.1)
Lymphs: 39 %
MCH: 31.9 pg (ref 26.6–33.0)
MCHC: 34.6 g/dL (ref 31.5–35.7)
MCV: 92 fL (ref 79–97)
Monocytes Absolute: 0.7 10*3/uL (ref 0.1–0.9)
Monocytes: 9 %
Neutrophils Absolute: 3.6 10*3/uL (ref 1.4–7.0)
Neutrophils: 49 %
Platelets: 238 10*3/uL (ref 150–450)
RBC: 4.33 x10E6/uL (ref 3.77–5.28)
RDW: 12.5 % (ref 11.7–15.4)
WBC: 7.4 10*3/uL (ref 3.4–10.8)

## 2019-09-16 LAB — ANA,IFA RA DIAG PNL W/RFLX TIT/PATN
ANA Titer 1: NEGATIVE
Cyclic Citrullin Peptide Ab: 7 units (ref 0–19)
Rhuematoid fact SerPl-aCnc: <10 IU/mL (ref 0.0–13.9)

## 2019-09-16 LAB — VITAMIN B12: Vitamin B-12: 481 pg/mL (ref 232–1245)

## 2019-09-16 LAB — TSH: TSH: 3.25 u[IU]/mL (ref 0.450–4.500)

## 2019-09-16 LAB — ALDOLASE: Aldolase: 4.5 U/L (ref 3.3–10.3)

## 2019-09-16 LAB — CK: Total CK: 285 U/L — ABNORMAL HIGH (ref 32–182)

## 2019-09-16 NOTE — Telephone Encounter (Signed)
LVM informing patient her she had unremarkable labs except CK slightly up. We will follow up MRI c-spine; please call [facility] to schedule. Gave her their #.   Dr Marjory Lies may consider EMG pending MRI results. Left # for questions.

## 2019-10-05 ENCOUNTER — Institutional Professional Consult (permissible substitution): Payer: Medicaid Other | Admitting: Plastic Surgery

## 2019-10-05 ENCOUNTER — Encounter: Payer: Self-pay | Admitting: Plastic Surgery

## 2019-10-12 ENCOUNTER — Ambulatory Visit
Admission: RE | Admit: 2019-10-12 | Discharge: 2019-10-12 | Disposition: A | Discharge status: Home / Self Care | Payer: Medicaid Other | Source: Ambulatory Visit | Attending: Diagnostic Neuroimaging | Admitting: Diagnostic Neuroimaging

## 2019-10-12 ENCOUNTER — Other Ambulatory Visit: Payer: Self-pay

## 2019-10-12 DIAGNOSIS — M542 Cervicalgia: Secondary | ICD-10-CM

## 2019-10-12 MED ORDER — GADOBENATE DIMEGLUMINE 529 MG/ML IV SOLN
20.0000 mL | Freq: Once | INTRAVENOUS | Status: AC | PRN
  Administered 2019-10-12: 20 mL via INTRAVENOUS

## 2019-10-16 ENCOUNTER — Ambulatory Visit (HOSPITAL_COMMUNITY)
Admission: EM | Admit: 2019-10-16 | Discharge: 2019-10-16 | Disposition: A | Discharge status: Home / Self Care | Payer: Medicaid Other | Attending: Urgent Care | Admitting: Urgent Care

## 2019-10-16 ENCOUNTER — Other Ambulatory Visit: Payer: Self-pay

## 2019-10-16 ENCOUNTER — Encounter (HOSPITAL_COMMUNITY): Payer: Self-pay

## 2019-10-16 DIAGNOSIS — B349 Viral infection, unspecified: Secondary | ICD-10-CM

## 2019-10-16 DIAGNOSIS — J029 Acute pharyngitis, unspecified: Secondary | ICD-10-CM | POA: Diagnosis present

## 2019-10-16 DIAGNOSIS — Z20822 Contact with and (suspected) exposure to covid-19: Secondary | ICD-10-CM

## 2019-10-16 LAB — POCT RAPID STREP A, ED / UC: Streptococcus, Group A Screen (Direct): NEGATIVE

## 2019-10-16 MED ORDER — NAPROXEN 375 MG PO TABS: 375.0000 mg | ORAL_TABLET | Freq: Two times a day (BID) | ORAL | 0 refills | Status: DC

## 2019-10-16 NOTE — Discharge Instructions (Addendum)

## 2019-10-16 NOTE — ED Provider Notes (Signed)
[facility]   MRN: [id number] DOB: 1990/09/05  Subjective:   Misty Walsh is a 29 y.o. female presenting for 5-day history intermittent mild sore throat.  Patient's son tested positive for COVID-19.  She would like to get tested today.  Denies fever, cough, chest pain, shortness of breath.  Denies any active throat pain in clinic.  No current facility-administered medications for this encounter.  Current Outpatient Medications:    amLODipine (NORVASC) 10 MG tablet, TK 1 T PO ONCE D (Patient not taking: Reported on 09/14/2019), Disp: , Rfl: 0   atorvastatin (LIPITOR) 20 MG tablet, TK 1 T PO ONCE D HS, Disp: , Rfl:    cetirizine (ZYRTEC) 10 MG tablet, TAKE 1 TABLET BY MOUTH ONCE DAILY FOR 30 DAYS, Disp: , Rfl:    HYDROcodone-acetaminophen (NORCO/VICODIN) 5-325 MG tablet, Take by mouth. (Patient not taking: Reported on 09/14/2019), Disp: , Rfl:    ibuprofen (ADVIL,MOTRIN) 600 MG tablet, Take 1 tablet (600 mg total) by mouth every 6 (six) hours as needed. (Patient not taking: Reported on 09/14/2019), Disp: 30 tablet, Rfl: 1   lisinopril-hydrochlorothiazide (PRINZIDE,ZESTORETIC) 10-12.5 MG tablet, TAKE 1 TABLET BY MOUTH ONCE DAILY FOR 30 DAYS (Patient not taking: Reported on 09/14/2019), Disp: , Rfl:    metoprolol succinate (TOPROL-XL) 25 MG 24 hr tablet, TK 1 T PO QD (Patient not taking: Reported on 09/14/2019), Disp: , Rfl: 0   traMADol (ULTRAM) 50 MG tablet, Take 1 tablet (50 mg total) by mouth every 6 (six) hours as needed for moderate pain. (Patient not taking: Reported on 09/14/2019), Disp: 10 tablet, Rfl: 0   Vitamin D, Ergocalciferol, (DRISDOL) 1.25 MG (50000 UT) CAPS capsule, TK ONE C PO WEEKLY (Patient not taking: Reported on 09/14/2019), Disp: , Rfl:    Allergies  Allergen Reactions   Macrobid [Nitrofurantoin Macrocrystal] Itching    Caused the patient to feel hot.    Past Medical History:  Diagnosis Date   Anxiety    Chlamydia    Depression med made her  navel itching and  made her sleepy so she quit taking them   Elevated cholesterol    GERD (gastroesophageal reflux disease)    History of cesarean section, classical 12/18/2012   2014    Human papilloma virus    Hypertension    Numbness    right arm to hand   Ovarian cyst      Past Surgical History:  Procedure Laterality Date   BREAST BIOPSY Left 2012   benign fibroadeoma   BREAST SURGERY     CESAREAN SECTION N/A 12/13/2012   Procedure: PRIMARY CESAREAN SECTION;  Surgeon: Kathreen Cosier, MD;  Location: [facility];  Service: Obstetrics;  Laterality: N/A;   LEEP      Family History  Problem Relation Age of Onset   Diabetes Mother    Heart disease Mother    Hypertension Mother    Depression Mother    Stroke Mother    Arthritis Mother    Stroke Father    Asthma Sister    Kidney disease Brother        genetic condition   Diabetes Maternal Aunt    Hypertension Maternal Aunt    Asthma Maternal Aunt    Epilepsy Maternal Aunt    Hypertension Maternal Grandmother    Dementia Maternal Grandmother    Heart disease Maternal Grandmother    Diabetes Paternal Grandmother     Social History   Tobacco Use   Smoking status: Current Every Day Smoker  Packs/day: 0.25    Years: 5.00    Pack years: 1.25    Types: Cigarettes   Smokeless tobacco: Never Used  Vaping Use   Vaping Use: Never used  Substance Use Topics   Alcohol use: Yes    Comment: occ   Drug use: No    ROS   Objective:   Vitals: BP (!) 152/86 (BP Location: Right Wrist)    Pulse 91    Temp 98.2 F (36.8 C) (Oral)    Resp 18    LMP 10/16/2019    SpO2 98%   Physical Exam Constitutional:      General: She is not in acute distress.    Appearance: Normal appearance. She is well-developed. She is obese. She is not ill-appearing.  HENT:     Head: Normocephalic and atraumatic.     Nose: Nose normal.     Mouth/Throat:     Mouth: Mucous membranes are moist.     Pharynx:  Oropharynx is clear.  Eyes:     General: No scleral icterus.    Extraocular Movements: Extraocular movements intact.     Pupils: Pupils are equal, round, and reactive to light.  Cardiovascular:     Rate and Rhythm: Normal rate.  Pulmonary:     Effort: Pulmonary effort is normal.  Skin:    General: Skin is warm and dry.  Neurological:     General: No focal deficit present.     Mental Status: She is alert and oriented to person, place, and time.  Psychiatric:        Mood and Affect: Mood normal.        Behavior: Behavior normal.     Results for orders placed or performed during the hospital encounter of 10/16/19 (from the past 24 hour(s))  POCT Rapid Strep A (ED/UC)     Status: None   Collection Time: 10/16/19  2:40 PM  Result Value Ref Range   Streptococcus, Group A Screen (Direct) NEGATIVE NEGATIVE    Assessment and Plan :   PDMP not reviewed this encounter.  1. Viral syndrome   2. Sore throat   3. Close exposure to COVID-19 virus     High suspicion for COVID-19 given Covid exposure.  Recommend supportive care, COVID-19, strep culture testing pending. Counseled patient on potential for adverse effects with medications prescribed/recommended today, ER and return-to-clinic precautions discussed, patient verbalized understanding.    Wallis Bamberg, [place] 10/16/19 [phone]

## 2019-10-16 NOTE — ED Triage Notes (Signed)
Pt present sore throat and exposure to covid 19, pt son was diagnosed with covid 19 on Wednesday. Last night pt started having something of sore throat.

## 2019-10-17 LAB — SARS CORONAVIRUS 2 (TAT 6-24 HRS): SARS Coronavirus 2: POSITIVE — AB

## 2019-10-18 ENCOUNTER — Encounter: Payer: Self-pay | Admitting: Adult Health

## 2019-10-18 ENCOUNTER — Telehealth: Payer: Self-pay | Admitting: Adult Health

## 2019-10-18 ENCOUNTER — Other Ambulatory Visit: Payer: Self-pay | Admitting: Adult Health

## 2019-10-18 ENCOUNTER — Encounter: Payer: Self-pay | Admitting: *Deleted

## 2019-10-18 DIAGNOSIS — U071 COVID-19: Secondary | ICD-10-CM

## 2019-10-18 NOTE — Progress Notes (Signed)
I connected by phone with Misty Walsh on 10/18/2019 at 6:36 PM to discuss the potential use of a new treatment for mild to moderate COVID-19 viral infection in non-hospitalized patients.  This patient is a 29 y.o. female that meets the FDA criteria for Emergency Use Authorization of COVID monoclonal antibody casirivimab/imdevimab.  Has a (+) direct SARS-CoV-2 viral test result  Has mild or moderate COVID-19   Is NOT hospitalized due to COVID-19  Is within 10 days of symptom onset  Has at least one of the high risk factor(s) for progression to severe COVID-19 and/or hospitalization as defined in EUA.  Specific high risk criteria : BMI > 25   I have spoken and communicated the following to the patient or parent/caregiver regarding COVID monoclonal antibody treatment:  1. FDA has authorized the emergency use for the treatment of mild to moderate COVID-19 in adults and pediatric patients with positive results of direct SARS-CoV-2 viral testing who are 72 years of age and older weighing at least 40 kg, and who are at high risk for progressing to severe COVID-19 and/or hospitalization.  2. The significant known and potential risks and benefits of COVID monoclonal antibody, and the extent to which such potential risks and benefits are unknown.  3. Information on available alternative treatments and the risks and benefits of those alternatives, including clinical trials.  4. Patients treated with COVID monoclonal antibody should continue to self-isolate and use infection control measures (e.g., wear mask, isolate, social distance, avoid sharing personal items, clean and disinfect "high touch" surfaces, and frequent handwashing) according to CDC guidelines.   5. The patient or parent/caregiver has the option to accept or refuse COVID monoclonal antibody treatment.  After reviewing this information with the patient, The patient agreed to proceed with receiving casirivimab\imdevimab infusion  and will be provided a copy of the Fact sheet prior to receiving the infusion. Misty Walsh 10/18/2019 6:36 PM

## 2019-10-18 NOTE — Telephone Encounter (Signed)
Called and LMOM regarding monoclonal antibody treatment for COVID 19 given to those who are at risk for complications and/or hospitalization of the virus.  Patient meets criteria based on: bmi greater than 25  Call back number given: [phone]  My chart message: sent  Buford Bremer, NP  

## 2019-10-19 LAB — CULTURE, GROUP A STREP (THRC)

## 2019-10-20 ENCOUNTER — Ambulatory Visit (HOSPITAL_COMMUNITY)
Admission: RE | Admit: 2019-10-20 | Discharge: 2019-10-20 | Disposition: A | Discharge status: Home / Self Care | Payer: Medicaid Other | Source: Ambulatory Visit | Attending: Pulmonary Disease | Admitting: Pulmonary Disease

## 2019-10-20 DIAGNOSIS — U071 COVID-19: Secondary | ICD-10-CM | POA: Insufficient documentation

## 2019-10-20 MED ORDER — METHYLPREDNISOLONE SODIUM SUCC 125 MG IJ SOLR: 125.0000 mg | Freq: Once | INTRAMUSCULAR | Status: DC | PRN

## 2019-10-20 MED ORDER — SODIUM CHLORIDE 0.9 % IV SOLN
1200.0000 mg | Freq: Once | INTRAVENOUS | Status: AC
  Administered 2019-10-20: 1200 mg via INTRAVENOUS

## 2019-10-20 MED ORDER — SODIUM CHLORIDE 0.9 % IV SOLN: INTRAVENOUS | Status: DC | PRN

## 2019-10-20 MED ORDER — FAMOTIDINE IN NACL 20-0.9 MG/50ML-% IV SOLN: 20.0000 mg | Freq: Once | INTRAVENOUS | Status: DC | PRN

## 2019-10-20 MED ORDER — DIPHENHYDRAMINE HCL 50 MG/ML IJ SOLN: 50.0000 mg | Freq: Once | INTRAMUSCULAR | Status: DC | PRN

## 2019-10-20 MED ORDER — EPINEPHRINE 0.3 MG/0.3ML IJ SOAJ: 0.3000 mg | Freq: Once | INTRAMUSCULAR | Status: DC | PRN

## 2019-10-20 MED ORDER — ALBUTEROL SULFATE HFA 108 (90 BASE) MCG/ACT IN AERS: 2.0000 | INHALATION_SPRAY | Freq: Once | RESPIRATORY_TRACT | Status: DC | PRN

## 2019-10-20 NOTE — Progress Notes (Signed)
  Diagnosis: COVID-19  Physician:P Delford Field Procedure: [facility] Infusion [facility] Med: casirivimab\imdevimab infusion - Provided patient with casirivimab\imdevimab fact sheet for patients, parents and caregivers prior to infusion.  Complications: No immediate complications noted.  Discharge: Discharged home   Shaune Spittle 10/20/2019

## 2019-10-20 NOTE — Discharge Instructions (Signed)
10 Things You Can Do to Manage Your COVID-19 Symptoms at Home If you have possible or confirmed COVID-19: 1. Stay home from work and school. And stay away from other public places. If you must go out, avoid using any kind of public transportation, ridesharing, or taxis. 2. Monitor your symptoms carefully. If your symptoms get worse, call your healthcare provider immediately. 3. Get rest and stay hydrated. 4. If you have a medical appointment, call the healthcare provider ahead of time and tell them that you have or may have COVID-19. 5. For medical emergencies, call 911 and notify the dispatch personnel that you have or may have COVID-19. 6. Cover your cough and sneezes with a tissue or use the inside of your elbow. 7. Wash your hands often with soap and water for at least 20 seconds or clean your hands with an alcohol-based hand sanitizer that contains at least 60% alcohol. 8. As much as possible, stay in a specific room and away from other people in your home. Also, you should use a separate bathroom, if available. If you need to be around other people in or outside of the home, wear a mask. 9. Avoid sharing personal items with other people in your household, like dishes, towels, and bedding. 10. Clean all surfaces that are touched often, like counters, tabletops, and doorknobs. Use household cleaning sprays or wipes according to the label instructions. [email or web address] 08/19/2018 This information is not intended to replace advice given to you by your health care provider. Make sure you discuss any questions you have with your health care provider.What types of side effects do monoclonal antibody drugs cause?  Common side effects  In general, the more common side effects caused by monoclonal antibody drugs include: . Allergic reactions, such as hives or itching . Flu-like signs and symptoms, including chills, fatigue, fever, and muscle aches and pains . Nausea, vomiting . Diarrhea . Skin  rashes . Low blood pressure   The CDC is recommending patients who receive monoclonal antibody treatments wait at least 90 days before being vaccinated.  Currently, there are no data on the safety and efficacy of mRNA COVID-19 vaccines in persons who received monoclonal antibodies or convalescent plasma as part of COVID-19 treatment. Based on the estimated half-life of such therapies as well as evidence suggesting that reinfection is uncommon in the 90 days after initial infection, vaccination should be deferred for at least 90 days, as a precautionary measure until additional information becomes available, to avoid interference of the antibody treatment with vaccine-induced immune responses. Document Revised: 01/21/2019 Document Reviewed: 01/21/2019 Elsevier Patient Education  2020 [employer] 10 Things You Can Do to Manage Your COVID-19 Symptoms at Home If you have possible or confirmed COVID-19: 11. Stay home from work and school. And stay away from other public places. If you must go out, avoid using any kind of public transportation, ridesharing, or taxis. 12. Monitor your symptoms carefully. If your symptoms get worse, call your healthcare provider immediately. 13. Get rest and stay hydrated. 14. If you have a medical appointment, call the healthcare provider ahead of time and tell them that you have or may have COVID-19. 15. For medical emergencies, call 911 and notify the dispatch personnel that you have or may have COVID-19. 16. Cover your cough and sneezes with a tissue or use the inside of your elbow. 17. Wash your hands often with soap and water for at least 20 seconds or clean your hands with an alcohol-based hand sanitizer that contains  at least 60% alcohol. 18. As much as possible, stay in a specific room and away from other people in your home. Also, you should use a separate bathroom, if available. If you need to be around other people in or outside of the home, wear a  mask. 19. Avoid sharing personal items with other people in your household, like dishes, towels, and bedding. 20. Clean all surfaces that are touched often, like counters, tabletops, and doorknobs. Use household cleaning sprays or wipes according to the label instructions. [email or web address] 08/19/2018 This information is not intended to replace advice given to you by your health care provider. Make sure you discuss any questions you have with your health care provider. Document Revised: 01/21/2019 Document Reviewed: 01/21/2019 Elsevier Patient Education  [place]. What types of side effects do monoclonal antibody drugs cause?  Common side effects  In general, the more common side effects caused by monoclonal antibody drugs include: . Allergic reactions, such as hives or itching . Flu-like signs and symptoms, including chills, fatigue, fever, and muscle aches and pains . Nausea, vomiting . Diarrhea . Skin rashes . Low blood pressure   The CDC is recommending patients who receive monoclonal antibody treatments wait at least 90 days before being vaccinated.  Currently, there are no data on the safety and efficacy of mRNA COVID-19 vaccines in persons who received monoclonal antibodies or convalescent plasma as part of COVID-19 treatment. Based on the estimated half-life of such therapies as well as evidence suggesting that reinfection is uncommon in the 90 days after initial infection, vaccination should be deferred for at least 90 days, as a precautionary measure until additional information becomes available, to avoid interference of the antibody treatment with vaccine-induced immune responses.

## 2019-10-21 ENCOUNTER — Ambulatory Visit (INDEPENDENT_AMBULATORY_CARE_PROVIDER_SITE_OTHER): Payer: Medicaid Other | Admitting: Family Medicine

## 2019-11-03 ENCOUNTER — Other Ambulatory Visit: Payer: Self-pay

## 2019-11-03 ENCOUNTER — Encounter (INDEPENDENT_AMBULATORY_CARE_PROVIDER_SITE_OTHER): Payer: Self-pay | Admitting: Bariatrics

## 2019-11-03 ENCOUNTER — Ambulatory Visit (INDEPENDENT_AMBULATORY_CARE_PROVIDER_SITE_OTHER): Payer: Medicaid Other | Admitting: Bariatrics

## 2019-11-03 VITALS — BP 148/92 | HR 73 | Temp 98.0°F | Ht 64.0 in | Wt 288.0 lb

## 2019-11-03 DIAGNOSIS — R0602 Shortness of breath: Secondary | ICD-10-CM

## 2019-11-03 DIAGNOSIS — Z1331 Encounter for screening for depression: Secondary | ICD-10-CM

## 2019-11-03 DIAGNOSIS — R7303 Prediabetes: Secondary | ICD-10-CM

## 2019-11-03 DIAGNOSIS — I10 Essential (primary) hypertension: Secondary | ICD-10-CM | POA: Diagnosis not present

## 2019-11-03 DIAGNOSIS — E559 Vitamin D deficiency, unspecified: Secondary | ICD-10-CM

## 2019-11-03 DIAGNOSIS — Z0289 Encounter for other administrative examinations: Secondary | ICD-10-CM

## 2019-11-03 DIAGNOSIS — R5383 Other fatigue: Secondary | ICD-10-CM

## 2019-11-03 DIAGNOSIS — Z6841 Body Mass Index (BMI) 40.0 and over, adult: Secondary | ICD-10-CM

## 2019-11-03 DIAGNOSIS — E785 Hyperlipidemia, unspecified: Secondary | ICD-10-CM

## 2019-11-03 DIAGNOSIS — E1169 Type 2 diabetes mellitus with other specified complication: Secondary | ICD-10-CM | POA: Diagnosis not present

## 2019-11-03 DIAGNOSIS — R0683 Snoring: Secondary | ICD-10-CM

## 2019-11-04 ENCOUNTER — Encounter (INDEPENDENT_AMBULATORY_CARE_PROVIDER_SITE_OTHER): Payer: Self-pay | Admitting: Bariatrics

## 2019-11-04 ENCOUNTER — Ambulatory Visit (INDEPENDENT_AMBULATORY_CARE_PROVIDER_SITE_OTHER): Payer: Medicaid Other | Admitting: Family Medicine

## 2019-11-04 DIAGNOSIS — E559 Vitamin D deficiency, unspecified: Secondary | ICD-10-CM | POA: Insufficient documentation

## 2019-11-04 DIAGNOSIS — R7303 Prediabetes: Secondary | ICD-10-CM | POA: Insufficient documentation

## 2019-11-04 DIAGNOSIS — E7800 Pure hypercholesterolemia, unspecified: Secondary | ICD-10-CM | POA: Insufficient documentation

## 2019-11-04 DIAGNOSIS — E785 Hyperlipidemia, unspecified: Secondary | ICD-10-CM | POA: Insufficient documentation

## 2019-11-04 LAB — COMPREHENSIVE METABOLIC PANEL
ALT: 20 IU/L (ref 0–32)
AST: 19 IU/L (ref 0–40)
Albumin/Globulin Ratio: 1.8 (ref 1.2–2.2)
Albumin: 4.5 g/dL (ref 3.9–5.0)
Alkaline Phosphatase: 84 IU/L (ref 44–121)
BUN/Creatinine Ratio: 9 (ref 9–23)
BUN: 7 mg/dL (ref 6–20)
Bilirubin Total: 0.3 mg/dL (ref 0.0–1.2)
CO2: 21 mmol/L (ref 20–29)
Calcium: 9.4 mg/dL (ref 8.7–10.2)
Chloride: 106 mmol/L (ref 96–106)
Creatinine, Ser: 0.74 mg/dL (ref 0.57–1.00)
GFR calc Af Amer: 127 mL/min/{1.73_m2} (ref >59)
GFR calc non Af Amer: 110 mL/min/{1.73_m2} (ref >59)
Globulin, Total: 2.5 g/dL (ref 1.5–4.5)
Glucose: 82 mg/dL (ref 65–99)
Potassium: 4.1 mmol/L (ref 3.5–5.2)
Sodium: 140 mmol/L (ref 134–144)
Total Protein: 7 g/dL (ref 6.0–8.5)

## 2019-11-04 LAB — VITAMIN D 25 HYDROXY (VIT D DEFICIENCY, FRACTURES): Vit D, 25-Hydroxy: 16.1 ng/mL — ABNORMAL LOW (ref 30.0–100.0)

## 2019-11-04 LAB — HEMOGLOBIN A1C
Est. average glucose Bld gHb Est-mCnc: 134 mg/dL
Hgb A1c MFr Bld: 6.3 % — ABNORMAL HIGH (ref 4.8–5.6)

## 2019-11-04 LAB — LIPID PANEL WITH LDL/HDL RATIO
Cholesterol, Total: 244 mg/dL — ABNORMAL HIGH (ref 100–199)
HDL: 47 mg/dL (ref >39)
LDL Chol Calc (NIH): 167 mg/dL — ABNORMAL HIGH (ref 0–99)
LDL/HDL Ratio: 3.6 ratio — ABNORMAL HIGH (ref 0.0–3.2)
Triglycerides: 165 mg/dL — ABNORMAL HIGH (ref 0–149)
VLDL Cholesterol Cal: 30 mg/dL (ref 5–40)

## 2019-11-04 LAB — INSULIN, RANDOM: INSULIN: 29.5 u[IU]/mL — ABNORMAL HIGH (ref 2.6–24.9)

## 2019-11-09 DIAGNOSIS — E669 Obesity, unspecified: Secondary | ICD-10-CM | POA: Insufficient documentation

## 2019-11-09 NOTE — Progress Notes (Signed)
Dear Dr. Lazarus Walsh,   Thank you for referring Misty Walsh to our clinic. The following note includes my evaluation and treatment recommendations.  Chief Complaint:   OBESITY Misty Walsh (MR# [id number]) is a 29 y.o. female who presents for evaluation and treatment of obesity and related comorbidities. Current BMI is Body mass index is 49.44 kg/m. Misty Walsh has been struggling with her weight for many years and has been unsuccessful in either losing weight, maintaining weight loss, or reaching her healthy weight goal.  Misty Walsh is currently in the action stage of change and ready to dedicate time achieving and maintaining a healthier weight. Misty Walsh is interested in becoming our patient and working on intensive lifestyle modifications including (but not limited to) diet and exercise for weight loss.  Misty Walsh likes to cook.  She struggles with large portions.  She craves chips and meat.  She skips meals.  Misty Walsh's habits were reviewed today and are as follows: she thinks her family will eat healthier with her, her desired weight loss is 108 pounds, she has been heavy most of her life, she started gaining weight after her mother's passing in 2008, her heaviest weight ever was 299 pounds, she is a picky eater and doesn't like to eat healthier foods, she craves chips, chicken, and salmon, she snacks frequently in the evenings, she wakes up frequently in the middle of the night to eat, she skips breakfast frequently, she is frequently drinking liquids with calories, she frequently makes poor food choices, she frequently eats larger portions than normal and she struggles with emotional eating.  Depression Screen Misty Walsh's Food and Mood (modified PHQ-9) score was 11.  Depression screen PHQ 2/9 11/03/2019  Decreased Interest 2  Down, Depressed, Hopeless 3  PHQ - 2 Score 5  Altered sleeping 3  Tired, decreased energy 2  Change in appetite 0  Feeling bad or failure about yourself  1  Trouble  concentrating 0  Moving slowly or fidgety/restless 0  Suicidal thoughts 0  PHQ-9 Score 11  Difficult doing work/chores Somewhat difficult   Subjective:   1. SOB (shortness of breath) on exertion Misty Walsh notes increasing shortness of breath with exercising and seems to be worsening over time with weight gain. She notes getting out of breath sooner with activity than she used to. This has gotten worse recently. Misty Walsh denies shortness of breath at rest or orthopnea.  2. Other fatigue Misty Walsh denies daytime somnolence and reports waking up still tired. Patent has a history of symptoms of morning fatigue, morning headache and snoring. Misty Walsh generally gets 3-5 hours of sleep per night, and states that she has poor quality sleep. Snoring is present. Apneic episodes are not present. Epworth Sleepiness Score is 2.  3. Essential hypertension Review: taking medications as instructed, no medication side effects noted, no chest pain on exertion, no dyspnea on exertion, no swelling of ankles.  She states she did not take her medication today.  Blood pressure is elevated.  She is taking HCTZ.  BP Readings from Last 3 Encounters:  11/03/19 (!) 148/92  10/20/19 (!) 149/105  10/16/19 (!) 152/86   4. Hyperlipidemia associated with type 2 diabetes mellitus (HCC) Misty Walsh has hyperlipidemia and has been trying to improve her cholesterol levels with intensive lifestyle modification including a low saturated fat diet, exercise and weight loss. She denies any chest pain, claudication or myalgias.  She is taking atorvastatin.   Lab Results  Component Value Date   ALT 20 11/03/2019   AST  19 11/03/2019   ALKPHOS 84 11/03/2019   BILITOT 0.3 11/03/2019   Lab Results  Component Value Date   CHOL 244 (H) 11/03/2019   HDL 47 11/03/2019   LDLCALC 167 (H) 11/03/2019   TRIG 165 (H) 11/03/2019   5. Prediabetes Misty Walsh has a diagnosis of prediabetes based on her elevated HgA1c and was informed this puts her at  greater risk of developing diabetes. She continues to work on diet and exercise to decrease her risk of diabetes. She denies nausea or hypoglycemia.  She was diagnosed 5-6 years ago.  Lab Results  Component Value Date   HGBA1C 6.3 (H) 11/03/2019   Lab Results  Component Value Date   INSULIN 29.5 (H) 11/03/2019   6. Vitamin D deficiency She is currently taking a multivitamin. She denies nausea, vomiting or muscle weakness.  7. Snoring Epworth sleepiness score is 11.  8. Depression screening Misty Walsh was screened for depression as part of her new patient workup today.  PHQ-9 score is 11.  Assessment/Plan:   1. SOB (shortness of breath) on exertion Misty Walsh does feel that she gets out of breath more easily that she used to when she exercises. Misty Walsh's shortness of breath appears to be obesity related and exercise induced. She has agreed to work on weight loss and gradually increase exercise to treat her exercise induced shortness of breath. Will continue to monitor closely.  - Lipid Panel With LDL/HDL Ratio - EKG 12-Lead  2. Other fatigue Misty Walsh does feel that her weight is causing her energy to be lower than it should be. Fatigue may be related to obesity, depression or many other causes. Labs will be ordered, and in the meanwhile, Misty Walsh will focus on self care including making healthy food choices, increasing physical activity and focusing on stress reduction.  - EKG 12-Lead - Comprehensive metabolic panel - Hemoglobin A1c - Insulin, random - Lipid Panel With LDL/HDL Ratio - VITAMIN D 25 Hydroxy (Vit-D Deficiency, Fractures) - EKG 12-Lead  3. Essential hypertension Misty Walsh is working on healthy weight loss and exercise to improve blood pressure control. We will watch for signs of hypotension as she continues her lifestyle modifications.  Continue medications.  - Comprehensive metabolic panel - Lipid Panel With LDL/HDL Ratio  4. Hyperlipidemia associated with type 2 diabetes  mellitus (HCC) Cardiovascular risk and specific lipid/LDL goals reviewed.  We discussed several lifestyle modifications today and Misty Walsh will continue to work on diet, exercise and weight loss efforts. Orders and follow up as documented in patient record. Continue medication.  Check CMP and lipids today.  Counseling Intensive lifestyle modifications are the first line treatment for this issue. . Dietary changes: Increase soluble fiber. Decrease simple carbohydrates. . Exercise changes: Moderate to vigorous-intensity aerobic activity 150 minutes per week if tolerated. . Lipid-lowering medications: see documented in medical record. .  - Comprehensive metabolic panel - Lipid Panel With LDL/HDL Ratio  5. Prediabetes Misty Walsh will continue to work on weight loss, exercise, and decreasing simple carbohydrates to help decrease the risk of diabetes.  Check A1c and insulin today.  - Hemoglobin A1c - Insulin, random  6. Vitamin D deficiency Low Vitamin D level contributes to fatigue and are associated with obesity, breast, and colon cancer. Check vitamin D level today.  - VITAMIN D 25 Hydroxy (Vit-D Deficiency, Fractures)  7. Snoring Referral for sleep study has been placed today.  8. Depression screening Misty Walsh's depression screen was positive today.  Misty Walsh had a positive depression screening. Depression is commonly associated with obesity  and often results in emotional eating behaviors. We will monitor this closely and work on CBT to help improve the non-hunger eating patterns. Referral to Psychology may be required if no improvement is seen as she continues in our clinic.  9. Class 3 severe obesity with serious comorbidity and body mass index (BMI) of 45.0 to 49.9 in adult, unspecified obesity type (HCC) Misty Walsh is currently in the action stage of change and her goal is to continue with weight loss efforts. I recommend Misty Walsh begin the structured treatment plan as follows:  She has agreed to  the Category 3 Plan.  She will work on meal planning, intentional eating, and stopping all sugary drinks.  Labs from 09/14/2019 were reviewed and include CMP, B12, CBC, TSH, and CK.  Exercise goals: For substantial health benefits, adults should do at least 150 minutes (2 hours and 30 minutes) a week of moderate-intensity, or 75 minutes (1 hour and 15 minutes) a week of vigorous-intensity aerobic physical activity, or an equivalent combination of moderate- and vigorous-intensity aerobic activity. Aerobic activity should be performed in episodes of at least 10 minutes, and preferably, it should be spread throughout the week.   Behavioral modification strategies: increasing lean protein intake, decreasing simple carbohydrates, increasing vegetables, increasing water intake, decreasing eating out, no skipping meals, meal planning and cooking strategies, keeping healthy foods in the home and planning for success.  She was informed of the importance of frequent follow-up visits to maximize her success with intensive lifestyle modifications for her multiple health conditions. She was informed we would discuss her lab results at her next visit unless there is a critical issue that needs to be addressed sooner. Misty Walsh agreed to keep her next visit at the agreed upon time to discuss these results.  Objective:   Blood pressure (!) 148/92, pulse 73, temperature 98 F (36.7 C), height 5\' 4"  (1.626 m), weight 288 lb (130.6 kg), last menstrual period 10/16/2019, SpO2 97 %. Body mass index is 49.44 kg/m.  EKG: Normal sinus rhythm, rate 70 bpm.  -Poor R-wave progression -nondiagnostic for this age. Otherwise normal.  Indirect Calorimeter completed today shows a VO2 of 281 and a REE of 1954.  Her calculated basal metabolic rate is [id number] thus her basal metabolic rate is worse than expected.  General: Cooperative, alert, well developed, in no acute distress. HEENT: Conjunctivae and lids  unremarkable. Cardiovascular: Regular rhythm.  Lungs: Normal work of breathing. Neurologic: No focal deficits.   Lab Results  Component Value Date   CREATININE 0.74 11/03/2019   BUN 7 11/03/2019   NA 140 11/03/2019   K 4.1 11/03/2019   CL 106 11/03/2019   CO2 21 11/03/2019   Lab Results  Component Value Date   ALT 20 11/03/2019   AST 19 11/03/2019   ALKPHOS 84 11/03/2019   BILITOT 0.3 11/03/2019   Lab Results  Component Value Date   HGBA1C 6.3 (H) 11/03/2019   HGBA1C 5.1 09/16/2012   Lab Results  Component Value Date   INSULIN 29.5 (H) 11/03/2019   Lab Results  Component Value Date   TSH 3.250 09/14/2019   Lab Results  Component Value Date   CHOL 244 (H) 11/03/2019   HDL 47 11/03/2019   LDLCALC 167 (H) 11/03/2019   TRIG 165 (H) 11/03/2019   Lab Results  Component Value Date   WBC 7.4 09/14/2019   HGB 13.8 09/14/2019   HCT 39.9 09/14/2019   MCV 92 09/14/2019   PLT 238 09/14/2019   Attestation Statements:  Reviewed by clinician on day of visit: allergies, medications, problem list, medical history, surgical history, family history, social history, and previous encounter notes.  Time spent on visit including pre-visit chart review and post-visit charting and care was 60 minutes.   I, Insurance claims handler, CMA, am acting as Energy manager for [employer], DO  I have reviewed the above documentation for accuracy and completeness, and I agree with the above. Corinna Capra, DO

## 2019-11-17 ENCOUNTER — Encounter (INDEPENDENT_AMBULATORY_CARE_PROVIDER_SITE_OTHER): Payer: Self-pay | Admitting: Bariatrics

## 2019-11-17 ENCOUNTER — Ambulatory Visit (INDEPENDENT_AMBULATORY_CARE_PROVIDER_SITE_OTHER): Payer: Medicaid Other | Admitting: Bariatrics

## 2019-11-17 ENCOUNTER — Other Ambulatory Visit: Payer: Self-pay

## 2019-11-17 VITALS — BP 137/87 | HR 87 | Temp 98.7°F | Ht 64.0 in | Wt 286.0 lb

## 2019-11-17 DIAGNOSIS — E559 Vitamin D deficiency, unspecified: Secondary | ICD-10-CM | POA: Diagnosis not present

## 2019-11-17 DIAGNOSIS — E7800 Pure hypercholesterolemia, unspecified: Secondary | ICD-10-CM | POA: Diagnosis not present

## 2019-11-17 DIAGNOSIS — R7303 Prediabetes: Secondary | ICD-10-CM

## 2019-11-17 DIAGNOSIS — Z6841 Body Mass Index (BMI) 40.0 and over, adult: Secondary | ICD-10-CM

## 2019-11-18 ENCOUNTER — Encounter (INDEPENDENT_AMBULATORY_CARE_PROVIDER_SITE_OTHER): Payer: Self-pay | Admitting: Bariatrics

## 2019-11-18 MED ORDER — VITAMIN D (ERGOCALCIFEROL) 1.25 MG (50000 UNIT) PO CAPS: 50000.0000 [IU] | ORAL_CAPSULE | ORAL | 0 refills | Status: DC

## 2019-11-18 NOTE — Progress Notes (Signed)
Chief Complaint:   OBESITY Jaylaa is here to discuss her progress with her obesity treatment plan along with follow-up of her obesity related diagnoses. Brenley is on the Category 3 Plan and states she is following her eating plan approximately 50% of the time. Martavia states she is exercising 0 minutes 0 times per week.  Today's visit was #: 2 Starting weight: 286 lbs Starting date: 11/03/19 Today's weight: 286 lbs Today's date: 11/18/2019 Total lbs lost to date: 2 Total lbs lost since last in-office visit: 2  Interim History: Evelena is down 2 pounds since her last visit. She is adjusting to the plan. She is adjusting to the water and protein intake.  Subjective:   1. Vitamin D deficiency Audray's Vitamin D level was 16.1 on 11/03/19. She is currently taking prescription vitamin D 50,000 IU each week. She denies nausea, vomiting or muscle weakness.  2. Prediabetes Keerthi has a diagnosis of prediabetes based on her elevated HgA1c of 6.3 and Insulin was 29.5 on 11/03/19. She was informed this puts her at greater risk of developing diabetes. She continues to work on diet and exercise to decrease her risk of diabetes. She denies nausea or hypoglycemia.  Lab Results  Component Value Date   HGBA1C 6.3 (H) 11/03/2019   Lab Results  Component Value Date   INSULIN 29.5 (H) 11/03/2019   3. Elevated cholesterol Oza has elevated cholesterol and is taking Lipitor.  Assessment/Plan:   1. Vitamin D deficiency Low Vitamin D level contributes to fatigue and are associated with obesity, breast, and colon cancer. She agrees to continue to take prescription Vitamin D @50 ,000 IU every week and will follow-up for routine testing of Vitamin D, at least 2-3 times per year to avoid over-replacement.  - Vitamin D, Ergocalciferol, (DRISDOL) 1.25 MG (50000 UNIT) CAPS capsule; Take 1 capsule (50,000 Units total) by mouth every 7 (seven) days.  Dispense: 4 capsule; Refill: 0  2. Prediabetes Brailee  will continue to work on weight loss, exercise, and decreasing simple carbohydrates to help decrease the risk of diabetes.  Jamara agrees to decrease carbohydrates, increase protein, and eating healthy fats.  3. Elevated cholesterol Merle has agreed to work on her diet and exercise. She will consistently take her medications.  4. Class 3 severe obesity with serious comorbidity and body mass index (BMI) of 45.0 to 49.9 in adult, unspecified obesity type (HCC)  Oktober is currently in the action stage of change. As such, her goal is to continue with weight loss efforts. She has agreed to the Category 3 Plan. Kathyann agrees to meal planning, mindful eating, and increasing water intake. We reviewed her labs: CMP, Lipid, VIT D, A1c, and Insulin on 11/03/19.  Exercise goals: Walking.  Behavioral modification strategies: increasing lean protein intake, decreasing simple carbohydrates, increasing vegetables, increasing water intake, decreasing eating out, no skipping meals, meal planning and cooking strategies, keeping healthy foods in the home and planning for success.  Atleigh has agreed to follow-up with our clinic in 2 weeks. She was informed of the importance of frequent follow-up visits to maximize her success with intensive lifestyle modifications for her multiple health conditions.   Objective:   Blood pressure 137/87, pulse 87, temperature 98.7 F (37.1 C), height 5\' 4"  (1.626 m), weight 286 lb (129.7 kg), last menstrual period 11/10/2019, SpO2 98 %. Body mass index is 49.09 kg/m.  General: Cooperative, alert, well developed, in no acute distress. HEENT: Conjunctivae and lids unremarkable. Cardiovascular: Regular rhythm.  Lungs: Normal  work of breathing. Neurologic: No focal deficits.   Lab Results  Component Value Date   CREATININE 0.74 11/03/2019   BUN 7 11/03/2019   NA 140 11/03/2019   K 4.1 11/03/2019   CL 106 11/03/2019   CO2 21 11/03/2019   Lab Results  Component Value Date    ALT 20 11/03/2019   AST 19 11/03/2019   ALKPHOS 84 11/03/2019   BILITOT 0.3 11/03/2019   Lab Results  Component Value Date   HGBA1C 6.3 (H) 11/03/2019   HGBA1C 5.1 09/16/2012   Lab Results  Component Value Date   INSULIN 29.5 (H) 11/03/2019   Lab Results  Component Value Date   TSH 3.250 09/14/2019   Lab Results  Component Value Date   CHOL 244 (H) 11/03/2019   HDL 47 11/03/2019   LDLCALC 167 (H) 11/03/2019   TRIG 165 (H) 11/03/2019   Lab Results  Component Value Date   WBC 7.4 09/14/2019   HGB 13.8 09/14/2019   HCT 39.9 09/14/2019   MCV 92 09/14/2019   PLT 238 09/14/2019   No results found for: IRON, TIBC, FERRITIN  Attestation Statements:   Reviewed by clinician on day of visit: allergies, medications, problem list, medical history, surgical history, family history, social history, and previous encounter notes.  Time spent on visit including pre-visit chart review and post-visit care and charting was 30 minutes.   I, Kirke Corin, CMA, am acting as Energy manager for [employer]. Manson Passey, DO  I have reviewed the above documentation for accuracy and completeness, and I agree with the above. Corinna Capra, DO

## 2019-12-02 ENCOUNTER — Ambulatory Visit (INDEPENDENT_AMBULATORY_CARE_PROVIDER_SITE_OTHER): Payer: Medicaid Other | Admitting: Bariatrics

## 2019-12-07 ENCOUNTER — Other Ambulatory Visit: Payer: Self-pay

## 2019-12-07 ENCOUNTER — Encounter: Payer: Self-pay | Admitting: Plastic Surgery

## 2019-12-07 ENCOUNTER — Ambulatory Visit (INDEPENDENT_AMBULATORY_CARE_PROVIDER_SITE_OTHER): Payer: Medicaid Other | Admitting: Plastic Surgery

## 2019-12-07 DIAGNOSIS — M549 Dorsalgia, unspecified: Secondary | ICD-10-CM | POA: Insufficient documentation

## 2019-12-07 DIAGNOSIS — M546 Pain in thoracic spine: Secondary | ICD-10-CM

## 2019-12-07 DIAGNOSIS — N62 Hypertrophy of breast: Secondary | ICD-10-CM | POA: Diagnosis not present

## 2019-12-07 DIAGNOSIS — R7303 Prediabetes: Secondary | ICD-10-CM

## 2019-12-07 DIAGNOSIS — M542 Cervicalgia: Secondary | ICD-10-CM | POA: Diagnosis not present

## 2019-12-07 DIAGNOSIS — G8929 Other chronic pain: Secondary | ICD-10-CM

## 2019-12-07 NOTE — Progress Notes (Signed)
Patient ID: Misty Walsh, female    DOB: [birth year], 29 y.o.   MRN: [id number]   Chief Complaint  Patient presents with   Advice Only    Mammary Hyperplasia: The patient is a 29 y.o. female with a history of mammary hyperplasia for several years.  She has extremely large breasts causing symptoms that include the following: Back pain in the upper and lower back, including neck pain. She pulls or pins her bra straps to provide better lift and relief of the pressure and pain. She notices relief by holding her breast up manually.  Her shoulder straps cause grooves and pain and pressure that requires padding for relief. Pain medication is sometimes required with motrin and tylenol.  Activities that are hindered by enlarged breasts include: exercise and running.  She has tried supportive clothing as well as fitted bras without improvement.  Her breasts are extremely large and fairly symmetric.  She has hyperpigmentation of the inframammary area on both sides.  The sternal to nipple distance on the right is 37 cm and the left is 38 cm.  The IMF distance is 18 cm.  She is 5 feet 4 inches tall and weighs 293 pounds.  Preoperative bra size = 44 JJ cup.she would like to be a B or C cup.  The estimated excess breast tissue to be removed at the time of surgery = 1400 grams on the left and 1400 grams on the right.  Mammogram history: 6 years ago with a benign biopsy.  No family history of breast cancer.   Tobacco use: She is currently smoking.  [facility].  She had a C-section for her delivery approximately 7 years ago.  Her son is disabled.   Review of Systems  Constitutional: Negative.  Negative for activity change.  HENT: Negative.   Eyes: Negative.   Respiratory: Negative.  Negative for chest tightness.   Cardiovascular: Negative.  Negative for leg swelling.  Gastrointestinal: Negative.  Negative for abdominal  distention.  Genitourinary: Negative.   Musculoskeletal: Positive for back pain and neck pain.  Hematological: Negative.   Psychiatric/Behavioral: Negative.     Past Medical History:  Diagnosis Date   Anxiety    Back pain    Chlamydia    Depression med made her navel itching and  made her sleepy so she quit taking them   Edema, lower extremity    Elevated cholesterol    GERD (gastroesophageal reflux disease)    History of cesarean section, classical 12/18/2012   2014    Human papilloma virus    Hypertension    Joint pain    Moderate dysplasia of cervix    Numbness    right arm to hand   Ovarian cyst    Prediabetes     Past Surgical History:  Procedure Laterality Date   BREAST BIOPSY Left 2012   benign fibroadeoma   BREAST SURGERY     CESAREAN SECTION N/A 12/13/2012   Procedure: PRIMARY CESAREAN SECTION;  Surgeon: Kathreen Cosier, MD;  Location: [facility];  Service: Obstetrics;  Laterality: N/A;   LEEP        Current Outpatient Medications:    atorvastatin (LIPITOR) 20 MG tablet, TK 1 T PO ONCE D HS, Disp: , Rfl:    cetirizine (ZYRTEC) 10 MG tablet, TAKE 1 TABLET BY MOUTH ONCE DAILY FOR 30 DAYS, Disp: , Rfl:    lisinopril-hydrochlorothiazide (PRINZIDE,ZESTORETIC)  10-12.5 MG tablet, TAKE 1 TABLET BY MOUTH ONCE DAILY FOR 30 DAYS, Disp: , Rfl:    Vitamin D, Ergocalciferol, (DRISDOL) 1.25 MG (50000 UNIT) CAPS capsule, Take 1 capsule (50,000 Units total) by mouth every 7 (seven) days., Disp: 4 capsule, Rfl: 0   Objective:   Vitals:   12/07/19 1104  BP: (!) 151/73  Pulse: 70  Temp: 98.3 F (36.8 C)  SpO2: 98%    Physical Exam Vitals and nursing note reviewed.  Constitutional:      Appearance: Normal appearance.  HENT:     Head: Normocephalic and atraumatic.  Cardiovascular:     Rate and Rhythm: Normal rate.     Pulses: Normal pulses.  Pulmonary:     Effort: Pulmonary effort is normal. No respiratory distress.  Abdominal:     General:  Abdomen is flat. There is no distension.  Neurological:     Mental Status: She is alert and oriented to person, place, and time.  Psychiatric:        Mood and Affect: Mood normal.        Behavior: Behavior normal.        Thought Content: Thought content normal.     Assessment & Plan:  Morbid obesity (HCC)  Symptomatic mammary hypertrophy  Chronic bilateral thoracic back pain  Neck pain  Prediabetes  We discussed what the scars would look like for the surgery.  She is already [facility]. She is willing to try physical therapy and I will send in the order.  She knows she must be 3 months no smoking to be eligible for surgery.  The plan is for her to follow-up with [place] in March and hopefully she will have stopped smoking, reduced her weight and completed physical therapy.  At that point we hope she will be eligible for a bilateral breast reduction.  Alena Bills Deylan Canterbury, DO

## 2019-12-16 ENCOUNTER — Institutional Professional Consult (permissible substitution): Payer: Medicaid Other | Admitting: Neurology

## 2019-12-20 ENCOUNTER — Other Ambulatory Visit (INDEPENDENT_AMBULATORY_CARE_PROVIDER_SITE_OTHER): Payer: Self-pay | Admitting: Bariatrics

## 2019-12-20 DIAGNOSIS — E559 Vitamin D deficiency, unspecified: Secondary | ICD-10-CM

## 2020-01-20 ENCOUNTER — Ambulatory Visit: Payer: Medicaid Other | Attending: Plastic Surgery

## 2020-01-20 ENCOUNTER — Other Ambulatory Visit: Payer: Self-pay

## 2020-01-20 DIAGNOSIS — M542 Cervicalgia: Secondary | ICD-10-CM | POA: Insufficient documentation

## 2020-01-20 DIAGNOSIS — M546 Pain in thoracic spine: Secondary | ICD-10-CM | POA: Diagnosis present

## 2020-01-20 DIAGNOSIS — M62838 Other muscle spasm: Secondary | ICD-10-CM | POA: Insufficient documentation

## 2020-01-20 DIAGNOSIS — R293 Abnormal posture: Secondary | ICD-10-CM | POA: Insufficient documentation

## 2020-01-20 DIAGNOSIS — M6281 Muscle weakness (generalized): Secondary | ICD-10-CM | POA: Insufficient documentation

## 2020-01-20 DIAGNOSIS — R209 Unspecified disturbances of skin sensation: Secondary | ICD-10-CM | POA: Insufficient documentation

## 2020-01-20 NOTE — Therapy (Signed)
[facility] [address] [place], [place], [address] Phone: [phone]   Fax:  [phone]  Physical Therapy Evaluation  Patient Details  Name: Misty PinksKeosha S Walsh MRN: [id number] Date of Birth: [birth year] Referring Provider (PT): Peggye Formillingham, Claire S, DO    Encounter Date: 01/20/2020   PT End of Session - 01/20/20 1223    Visit Number 1    Number of Visits 7    Date for PT Re-Evaluation 03/04/20    Authorization Type [place] [employer]    PT Start Time 1045    PT Stop Time 1130    PT Time Calculation (min) 45 min    Activity Tolerance Patient tolerated treatment well    Behavior [facility] for tasks assessed/performed           Past Medical History:  Diagnosis Date   Anxiety    Back pain    Chlamydia    Depression med made her navel itching and  made her sleepy so she quit taking them   Edema, lower extremity    Elevated cholesterol    GERD (gastroesophageal reflux disease)    History of cesarean section, classical 12/18/2012   2014    Human papilloma virus    Hypertension    Joint pain    Moderate dysplasia of cervix    Numbness    right arm to hand   Ovarian cyst    Prediabetes     Past Surgical History:  Procedure Laterality Date   BREAST BIOPSY Left 2012   benign fibroadeoma   BREAST SURGERY     CESAREAN SECTION N/A 12/13/2012   Procedure: PRIMARY CESAREAN SECTION;  Surgeon: Kathreen CosierBernard A Marshall, MD;  Location: [facility];  Service: Obstetrics;  Laterality: N/A;   LEEP      There were no vitals filed for this visit.    Subjective Assessment - 01/20/20 1048    Subjective "It got worse the last 2 years but have had pain probably a little longer than that. I get a lot of shoulder pain, neck pain, and weakness with some numbness in my arms. I have been dropping things the past 2 years and have even dropped my son suddenly once without realizing it was even happening. The last time  I dropped something was probably a couple weaks ago. I can tell it's going to happen because I get a lot of inflammation in my neck and shoulders. I went to the neurologist because of it - they did an MRI and said it all looked okay but said something about fluid with some of my lab results. I'm not really sure what they said exactly."    Pertinent History anxiety, depression, HTN, prediabetes (see extensive PMH above)    Limitations Sitting;Lifting;House hold activities   household: trouble with trash and lifting heavier objects   How long can you sit comfortably? "I have to readjust a lot"    How long can you stand comfortably? "I prefer to stand up most of the time and try to stretch"    How long can you walk comfortably? No issues with walking    Diagnostic tests Cervical MRI 10/12/2019: Unremarkable MRI cervical spine (with and without), no spinal stenosis or foraminal narrowing, and no spinal cord lesions.    Patient Stated Goals Just getting rid of the discomfort/pain and not worrying about dropping objects    Currently in Pain? No/denies    Pain Score 0-No pain    Pain Onset  More than a month ago    Pain Frequency Intermittent    Aggravating Factors  Random onset of symptoms "hard to tell"    Pain Relieving Factors Forward flexion (bending and touching toes), chin tucks    Effect of Pain on Daily Activities Difficulty and hesitancy holding items due to dropping items randomly on occasion; difficulty performing daily activities during symptom exacerbation              [facility] PT Assessment - 01/20/20 0001      Assessment   Medical Diagnosis Symptomatic mammary hypertrophy N62, Chronic bilateral thoracic back pain M54.6, G89.29, Neck pain M54.2    Referring Provider (PT) Dillingham, Alena Bills, DO     Onset Date/Surgical Date --   2 years ago   Hand Dominance Right    Next MD Visit Nothing scheduled    Prior Therapy No      Precautions   Precautions None      Restrictions   Weight  Bearing Restrictions No      Balance Screen   Has the patient fallen in the past 6 months No    Has the patient had a decrease in activity level because of a fear of falling?  Yes    Is the patient reluctant to leave their home because of a fear of falling?  Yes   stays at home more due to pain     Home Environment   Living Environment Private residence    Living Arrangements Children   son   Available Help at Discharge Other (Comment)   independent   Type of Home Apartment    Home Access Level entry    Home Layout One level    Home Equipment None      Prior Function   Level of Independence Independent    Vocation Unemployed   Currently out of work to take care of son   Leisure Therapist, nutritional, go on adventures, take my son out to different places      Cognition   Overall Cognitive Status Within Functional Limits for tasks assessed      ROM / Strength   AROM / PROM / Strength AROM;Strength      AROM   AROM Assessment Site Cervical    Cervical Flexion 50    Cervical Extension 45    Cervical - Right Side Bend 42    Cervical - Left Side Bend 42    Cervical - Right Rotation 70    Cervical - Left Rotation 65      Strength   Strength Assessment Site Shoulder;Elbow    Right/Left Shoulder Right;Left    Right Shoulder Flexion 4/5    Right Shoulder Extension 4/5    Right Shoulder ABduction 4/5    Right Shoulder Internal Rotation 4/5    Right Shoulder External Rotation 4/5    Left Shoulder Flexion 4/5    Left Shoulder Extension 4/5    Left Shoulder ABduction 4/5    Left Shoulder Internal Rotation 4/5    Left Shoulder External Rotation 4/5                      Objective measurements completed on examination: See above findings.               PT Education - 01/20/20 1338    Education Details Patient education regarding HEP and how to access using [employer] Go app, anatomy of condition, postural control, and POC. Advised pt to  follow up with doctor if pt has  persistent incidents of dropping items or worsening of symptoms.    Person(s) Educated Patient    Methods Explanation;Demonstration;Tactile cues;Verbal cues;Handout    Comprehension Verbalized understanding;Returned demonstration;Verbal cues required;Tactile cues required;Need further instruction            PT Short Term Goals - 01/20/20 1218      PT SHORT TERM GOAL #1   Title Patient will be independent with initial HEP.    Baseline Pt provided with initial HEP during evaluation 01/20/2020.    Time 3    Period Weeks    Status New    Target Date 03/02/20      PT SHORT TERM GOAL #2   Title Patient will be able to report no episodes of suddenly dropping an object within a 2 week timeframe.    Baseline Pt randomly drops items occasionally with the last time being 2 weeks ago.    Time 3    Period Weeks    Status New    Target Date 02/10/20      PT SHORT TERM GOAL #3   Title Pt will be able to sit for at least 20 minutes without having to readjust due to significant discomfort/pain.    Baseline Pt states she has to readjust constantly due to discomfort which was also observed throughout evaluation session.    Time 3    Period Weeks    Status New    Target Date 02/10/20      PT SHORT TERM GOAL #4   Title Patient will demonstrate upright posture in sitting rather than slouched posture throughout treatment session with minimal to no cues.    Baseline Slouched sitting posture with cues/education regarding postural correction    Time 3    Period Weeks    Status New    Target Date 02/10/20             PT Long Term Goals - 01/20/20 1226      PT LONG TERM GOAL #1   Title Pt will be independent with advanced HEP.    Baseline Pt provided with initial HEP during evaluation 01/20/2020.    Time 6    Period Weeks    Status New    Target Date 03/02/20      PT LONG TERM GOAL #2   Title Pt will demonstrate 5/5 BUE MMT grossly.    Baseline 4/5    Time 6    Period Weeks    Status  New    Target Date 03/02/20      PT LONG TERM GOAL #3   Title Patient will be able to report no incidents of dropping items for at least 5 weeks.    Baseline Pt randomly drops items occasionally with the last time being 2 weeks ago.    Time 6    Period Weeks    Status New    Target Date 03/02/20                  Plan - 01/20/20 1154    Clinical Impression Statement Patient is a 29 year old female who presents to [facility] with complaints of chronic cervical and thoracic spine pain, symptomatic mammary hypertrophy, bilateral shoulder pain/tightness, and BUE weakness that results in ocassionally suddenly dropping items. Office visit from 09/14/19 in patient's chart includes patient mentioning numbness/tingling in BLE as well (LLE > RLE) that pt did not mention during today's evaluation. This information in  chart and patient's report of BUE weakness/numbness and dropping items lead PT to be concerned about potential cervical myelopathy. MRI of cervical spine 10/12/2019 reveals unremarkable MRI cervical spine (with and without), no spinal stenosis or foraminal narrowing, and no spinal cord lesions. PT will further assess BUE/BLE sensation and BLE strength next session. Pt presents with tightness and TTP along suboccipitals, B levator scap, and B upper trap. She displays slouched sitting posture with forward head and rounded shoulders. Patient should benefit from skilled PT intevention to address deficits above and improve patient's symptoms and functional activity tolerance.    Personal Factors and Comorbidities Comorbidity 1;Comorbidity 2;Comorbidity 3+;Past/Current Experience;Time since onset of injury/illness/exacerbation    Comorbidities anxiety, depression, HTN, prediabetes (see extensive PMH above)    Examination-Activity Limitations Carry;Caring for Others;Lift    Examination-Participation Restrictions Cleaning;Shop;Community Activity   cleaning/shopping: carrying trash, groceries, and other  items   Stability/Clinical Decision Making Evolving/Moderate complexity    Clinical Decision Making Moderate    Rehab Potential Good    PT Frequency 1x / week    PT Duration 6 weeks    PT Treatment/Interventions ADLs/Self Care Home Management;Aquatic Therapy;Cryotherapy;Iontophoresis 4mg /ml Dexamethasone;Moist Heat;Traction;Electrical Stimulation;Ultrasound;Functional mobility training;Therapeutic activities;Therapeutic exercise;Neuromuscular re-education;Manual techniques;Patient/family education;Dry needling;Passive range of motion;Energy conservation;Taping    PT Next Visit Plan Review and update HEP PRN, assess grip strength (consider BLE strength - ask pt if she still has N/T in BLE), sensation assessment, manual therapy (STM/myofascial release, cervical and thoracic joint mobilizations, manual cervical traction), postural exercises/periscapular strengthening, demo use of tennis ball/thera-cane for self STM/myofascial release    PT Home Exercise Plan [id number] - upper trap and levator scap stretches, scapular retraction, chin tucks (cervical retraction), ulnar nerve glides (trial to see if any improvement of UE symptoms occurs)    Recommended Other Services Advised pt to follow up with her doctor if she contiinues to drop objects frequently or has persistent worsening of symptoms    Consulted and Agree with Plan of Care Patient           Patient will benefit from skilled therapeutic intervention in order to improve the following deficits and impairments:  Postural dysfunction, Pain, Decreased strength, Impaired UE functional use, Increased muscle spasms, Obesity, Improper body mechanics  Visit Diagnosis: Abnormal posture  Cervicalgia  Pain in thoracic spine  Other muscle spasm  Muscle weakness (generalized)  Unspecified disturbances of skin sensation     Problem List Patient Active Problem List   Diagnosis Date Noted   Symptomatic mammary hypertrophy 12/07/2019   Back pain  12/07/2019   Neck pain 12/07/2019   Morbid obesity (HCC) 11/09/2019   Prediabetes 11/04/2019   Elevated cholesterol 11/04/2019   Vitamin D deficiency 11/04/2019   Hemorrhagic ovarian cyst 06/21/2017   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   Cellulitis 01/06/2013   History of cesarean section, classical 12/18/2012   Allergy 10/29/2012     [employer] Authorization   Choose one: Rehabilitative  Standardized Assessment or Functional Outcome Tool: See Pain Assessment  Body Parts Treated (Select each separately):  1. Cervicothoracic. Overall deficits/functional limitations for body part selected: moderate   12/29/2012, PT, DPT 01/20/20 1:46 PM  [facility] [address] [place], [place], [place] Phone: [phone]   Fax:  [phone]  Name: Misty Walsh MRN: Misty Walsh Date of Birth: 1990-11-18

## 2020-01-25 ENCOUNTER — Ambulatory Visit: Payer: Medicaid Other | Admitting: Neurology

## 2020-01-25 ENCOUNTER — Encounter: Payer: Self-pay | Admitting: Neurology

## 2020-01-25 VITALS — BP 130/80 | HR 71 | Ht 64.0 in | Wt 294.0 lb

## 2020-01-25 DIAGNOSIS — E662 Morbid (severe) obesity with alveolar hypoventilation: Secondary | ICD-10-CM

## 2020-01-25 DIAGNOSIS — G4721 Circadian rhythm sleep disorder, delayed sleep phase type: Secondary | ICD-10-CM | POA: Diagnosis not present

## 2020-01-25 DIAGNOSIS — G478 Other sleep disorders: Secondary | ICD-10-CM | POA: Diagnosis not present

## 2020-01-25 DIAGNOSIS — R0683 Snoring: Secondary | ICD-10-CM

## 2020-01-25 DIAGNOSIS — R7303 Prediabetes: Secondary | ICD-10-CM

## 2020-01-25 DIAGNOSIS — R519 Headache, unspecified: Secondary | ICD-10-CM

## 2020-01-25 MED ORDER — TRAZODONE HCL 50 MG PO TABS
50.0000 mg | ORAL_TABLET | Freq: Every day | ORAL | 2 refills | Status: DC
Start: 2020-01-25 — End: 2020-07-26

## 2020-01-25 NOTE — Progress Notes (Signed)
SLEEP MEDICINE CLINIC    Provider:  Melvyn Novas, MD  Primary Care Physician:  [facility] [address] [place] [address]     Referring Provider:  [facility]   Corinna Capra, DO           Chief Complaint according to patient   Patient presents with:    . New Patient (Initial Visit)     pt alone, rm 10. presents today due to her sleep struggles. the patient states that she will do everything she can and around 8:30/9 pm wind down to get ready for bed. she states most nights its 1-3 am before she is able to fall asleep. she wakes up around 7 am. she has never had a SS and does snore in sleep. c/o of feeling tired during the day but feels due to not getting good amount of sleep. she has tried OTC meds to help with falling asleep, melatonin, tylenol pm, sleep vape.       HISTORY OF PRESENT ILLNESS:  Misty Walsh is a 29 year old African American female patient seen here upon a consultation  on 01/25/2020 from  [place], DO.   Chief concern according to patient : Snoring, Insomnia, delayed sleep onset. Help in weight loss.    I have the pleasure of seeing Misty Walsh today, a right -handed Black or [place] American female with a possible sleep disorder.  She   has a past medical history of Anxiety, Back pain, Chlamydia, Depression (med made her navel itching and  made her sleepy so she quit taking them), Edema, lower extremity, Elevated cholesterol, GERD (gastroesophageal reflux disease), History of cesarean section, classical (12/18/2012), Human papilloma virus, Hypertension, Joint pain, Moderate dysplasia of cervix, Numbness, Ovarian cyst, and Prediabetes. Had Covid 19 -10-16-2019 after having had only one shot vaccine. She was treated Iv MAB, 10-20-2019.        Family medical /sleep history: maternal female cousine sleep paralysis and OSA.    Social history:  Patient is staying home to take care of her son, a special  needs child, 18 years old.  She lives in a household with son.  Tobacco use; quit 12-2019.  ETOH use -; socially, Caffeine intake in form of Coffee(2-3 a day) Soda( recently quit /) Tea ( all day) or energy drinks.   Sleep habits are as follows: The patient's dinner time is between 6-8 PM. The patient goes to bed at 8.30 PM and watches Tv or phone- continues to feel anxious and it keeps her from sleeping, often until the morning  hours,  Son has nocturnal seizures- wakes for one bathroom break.   The preferred sleep position is prone or supine  with the support of 3-4 pillows. Dreams are reportedly rare. 7-8  AM is the usual rise time. The patient wakes up with an alarm.  She reports not feeling refreshed or restored in AM, with symptoms such as dry mouth , morning headaches , and residual fatigue. Naps are taken frequently, irresistible urge to sleep  lasting from 1-4 hours  and are less refreshing than nocturnal sleep.       Review of Systems: Out of a complete 14 system review, the patient complains of only the following symptoms, and all other reviewed systems are negative.:  Fatigue, sleepiness , snoring, fragmented sleep, Insomnia - delayed sleep phase.  sleep headaches,  Anxiety- hypervigilance, OHV  Body ache and fatigue from  COVID.    How likely are you to doze in the following situations: 0 = not likely, 1 = slight chance, 2 = moderate chance, 3 = high chance   Sitting and Reading? 1 Watching Television? 2 Sitting inactive in a public place (theater or meeting)?1-2 As a passenger in a car for an hour without a break?0 Lying down in the afternoon when circumstances permit? 3 Sitting and talking to someone?1 Sitting quietly after lunch without alcohol?3 In a car, while stopped for a few minutes in traffic?0   Total = 11 / 24 points   FSS endorsed at40/ 63 points.   Social History   Socioeconomic History  . Marital status: Single    Spouse name: Not on file  . Number of  children: 1  . Years of education: Not on file  . Highest education level: 11th grade  Occupational History  . Occupation: work at home    Comment: at home  Tobacco Use  . Smoking status: Current Every Day Smoker    Packs/day: 0.25    Years: 5.00    Pack years: 1.25    Types: Cigarettes  . Smokeless tobacco: Never Used  Vaping Use  . Vaping Use: Never used  Substance and Sexual Activity  . Alcohol use: Yes    Comment: occ  . Drug use: No  . Sexual activity: Yes    Birth control/protection: None  Other Topics Concern  . Not on file  Social History Narrative   Lives with her child   Caffeine- green tea, Poweraid, grape juice   Social Determinants of Health   Financial Resource Strain:   . Difficulty of Paying Living Expenses: Not on file  Food Insecurity:   . Worried About Programme researcher, broadcasting/film/video in the Last Year: Not on file  . Ran Out of Food in the Last Year: Not on file  Transportation Needs:   . Lack of Transportation (Medical): Not on file  . Lack of Transportation (Non-Medical): Not on file  Physical Activity:   . Days of Exercise per Week: Not on file  . Minutes of Exercise per Session: Not on file  Stress:   . Feeling of Stress : Not on file  Social Connections:   . Frequency of Communication with Friends and Family: Not on file  . Frequency of Social Gatherings with Friends and Family: Not on file  . Attends Religious Services: Not on file  . Active Member of Clubs or Organizations: Not on file  . Attends Banker Meetings: Not on file  . Marital Status: Not on file    Family History  Problem Relation Age of Onset  . Diabetes Mother   . Heart disease Mother   . Hypertension Mother   . Depression Mother   . Stroke Mother   . Arthritis Mother   . Stroke Father   . High blood pressure Father   . High Cholesterol Father   . Depression Father   . Anxiety disorder Father   . Obesity Father   . Asthma Sister   . Kidney disease Brother         genetic condition  . Diabetes Maternal Aunt   . Hypertension Maternal Aunt   . Asthma Maternal Aunt   . Epilepsy Maternal Aunt   . Hypertension Maternal Grandmother   . Dementia Maternal Grandmother   . Heart disease Maternal Grandmother   . Diabetes Paternal Grandmother     Past Medical History:  Diagnosis Date  .  Anxiety   . Back pain   . Chlamydia   . Depression med made her navel itching and  made her sleepy so she quit taking them  . Edema, lower extremity   . Elevated cholesterol   . GERD (gastroesophageal reflux disease)   . History of cesarean section, classical 12/18/2012   2014   . Human papilloma virus   . Hypertension   . Joint pain   . Moderate dysplasia of cervix   . Numbness    right arm to hand  . Ovarian cyst   . Prediabetes     Past Surgical History:  Procedure Laterality Date  . BREAST BIOPSY Left 2012   benign fibroadeoma  . BREAST SURGERY    . CESAREAN SECTION N/A 12/13/2012   Procedure: PRIMARY CESAREAN SECTION;  Surgeon: Kathreen CosierBernard A Marshall, MD;  Location: [facility];  Service: Obstetrics;  Laterality: N/A;  . LEEP       Current Outpatient Medications on File Prior to Visit  Medication Sig Dispense Refill  . atorvastatin (LIPITOR) 20 MG tablet TK 1 T PO ONCE D HS    . cetirizine (ZYRTEC) 10 MG tablet TAKE 1 TABLET BY MOUTH ONCE DAILY FOR 30 DAYS    . lisinopril-hydrochlorothiazide (PRINZIDE,ZESTORETIC) 10-12.5 MG tablet TAKE 1 TABLET BY MOUTH ONCE DAILY FOR 30 DAYS    . Vitamin D, Ergocalciferol, (DRISDOL) 1.25 MG (50000 UNIT) CAPS capsule Take 1 capsule (50,000 Units total) by mouth every 7 (seven) days. 4 capsule 0   No current facility-administered medications on file prior to visit.    Allergies  Allergen Reactions  . Macrobid [Nitrofurantoin Macrocrystal] Itching    Caused the patient to feel hot.    Physical exam:  Today's Vitals   01/25/20 0948  BP: 130/80  Pulse: 71  Weight: 294 lb (133.4 kg)  Height: 5\' 4"  (1.626 m)   Body  mass index is 50.46 kg/m.   Wt Readings from Last 3 Encounters:  01/25/20 294 lb (133.4 kg)  12/07/19 293 lb (132.9 kg)  11/17/19 286 lb (129.7 kg)     Ht Readings from Last 3 Encounters:  01/25/20 5\' 4"  (1.626 m)  12/07/19 5\' 4"  (1.626 m)  11/17/19 5\' 4"  (1.626 m)      General: The patient is awake, alert and appears not in acute distress. The patient is well groomed. Head: Normocephalic, atraumatic. Neck is supple. Mallampati 3,  neck circumference:19 inches . Nasal airflow   patent.  Retrognathia is seen.  Dental status: intact  Cardiovascular:  Regular rate and cardiac rhythm by pulse,  without distended neck veins. Respiratory: Lungs are clear to auscultation.  Skin:  With evidence of ankle edema.. Trunk: The patient's posture is erect.   Neurologic exam : The patient is awake and alert, oriented to place and time.   Memory subjective described as intact.  Attention span & concentration ability appears normal.  Speech is fluent,  without  dysarthria, dysphonia or aphasia.  Mood and affect are appropriate.   Cranial nerves: no loss of smell or taste reported  Pupils are equal and briskly reactive to light. Funduscopic exam deferred.   Extraocular movements in vertical and horizontal planes were intact and without nystagmus. No Diplopia. Visual fields by finger perimetry are intact. Hearing was intact to soft voice and finger rubbing. Facial sensation intact to fine touch.  Facial motor strength is symmetric and tongue and uvula move midline.  Neck ROM : rotation, tilt and flexion extension were normal for age  and shoulder shrug was symmetrical.    Motor exam:  Symmetric bulk, tone and ROM.   Normal tone without cog wheeling, symmetric grip strength . Sensory:  Fine touch, pinprick and vibration were tested  and  normal.  Proprioception tested in the upper extremities was normal.  Coordination: Rapid alternating movements in the fingers/hands were of normal speed.  The  Finger-to-nose maneuver was intact without evidence of ataxia, dysmetria or tremor.  Gait and station: Patient could rise unassisted from a seated position, walked without assistive device.  Stance is of normal width/ base and the patient turned with 3 steps.  Toe and heel walk were deferred.  Deep tendon reflexes: in the  upper and lower extremities are symmetric and intact.  Babinski response was deferred .       After spending a total time of 46 minutes face to face and additional time for physical and neurologic examination, review of laboratory studies,  personal review of imaging studies, reports and results of other testing and review of referral information / records as far as provided in visit, I have established the following assessments:  1) Misty Walsh has definitely risk factors for the presence of obstructive sleep apnea this is related to her metabolic syndrome she is not hypertensive today but she does have a history of documented prediabetes, and her BMI is 50 kg/m in order to reduce her BMI her weight loss specialists have recommended a sleep study also to help her improve her overall sleep duration.  She knows that she is snoring but she has never woken up choking or feeling short of air.  Even when she contracted Covid in late August she did not have necessarily shortness of breath. 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  He was born at [redacted] weeks gestation.  She is hypervigilant at night also because he developed nocturnal seizures.  It is a concern about her son that really does not let her drift into a deep sleep.  Even a sleep aid would not necessarily be a good cure or onset for this part of her condition.  So my goal is to offer her a mild sleep aid that will not make it difficult for her to wake up if her son  should need and I would suggest trazodone and we will obtain a sleep study to make sure that the screen her for sleep apnea.     My Plan is to proceed with:  1) HST or PSG  For Sleep headches, and snoring, rule out OHV and OSA. 2) depression and anxiety at the origin for her insomnia, delayed sleep phase.   she failed melatonin- trazodone 25 mg po   I would like to thank Dr Manson Passey  for allowing me to meet with and to take care of this pleasant patient.   I I plan to follow up either personally or through our NP within 3-4 month.   CC: I will share my notes with  Dr Manson Passey.   Electronically signed by: Melvyn Novas, MD 01/25/2020 10:16 AM  [facility] Board certified by The [employer] of Sleep Medicine and Diplomate of the [employer] of Sleep Medicine. Board certified In Neurology through the ABPN, Fellow of the [employer] of Neurology. Medical Director of [facility].

## 2020-01-25 NOTE — Patient Instructions (Signed)

## 2020-01-31 ENCOUNTER — Ambulatory Visit (INDEPENDENT_AMBULATORY_CARE_PROVIDER_SITE_OTHER): Payer: Medicaid Other | Admitting: Bariatrics

## 2020-01-31 ENCOUNTER — Encounter (INDEPENDENT_AMBULATORY_CARE_PROVIDER_SITE_OTHER): Payer: Self-pay | Admitting: Bariatrics

## 2020-01-31 ENCOUNTER — Other Ambulatory Visit: Payer: Self-pay

## 2020-01-31 VITALS — BP 112/77 | HR 77 | Temp 98.3°F | Ht 64.0 in | Wt 287.0 lb

## 2020-01-31 DIAGNOSIS — R7303 Prediabetes: Secondary | ICD-10-CM

## 2020-01-31 DIAGNOSIS — I10 Essential (primary) hypertension: Secondary | ICD-10-CM

## 2020-01-31 DIAGNOSIS — K219 Gastro-esophageal reflux disease without esophagitis: Secondary | ICD-10-CM

## 2020-01-31 DIAGNOSIS — Z6841 Body Mass Index (BMI) 40.0 and over, adult: Secondary | ICD-10-CM

## 2020-01-31 DIAGNOSIS — E559 Vitamin D deficiency, unspecified: Secondary | ICD-10-CM

## 2020-01-31 MED ORDER — PANTOPRAZOLE SODIUM 40 MG PO TBEC: 40.0000 mg | DELAYED_RELEASE_TABLET | Freq: Every day | ORAL | 3 refills | Status: DC

## 2020-01-31 MED ORDER — VITAMIN D (ERGOCALCIFEROL) 1.25 MG (50000 UNIT) PO CAPS: 50000.0000 [IU] | ORAL_CAPSULE | ORAL | 0 refills | Status: DC

## 2020-01-31 MED ORDER — OZEMPIC (0.25 OR 0.5 MG/DOSE) 2 MG/1.5ML ~~LOC~~ SOPN: 0.2500 mg | PEN_INJECTOR | SUBCUTANEOUS | 0 refills | Status: DC

## 2020-01-31 NOTE — Progress Notes (Signed)
Chief Complaint:   OBESITY Misty Walsh is here to discuss her progress with her obesity treatment plan along with follow-up of her obesity related diagnoses. Misty Walsh is on the Category 3 Plan and states she is following her eating plan approximately 0% of the time. Misty Walsh states she is exercising 0 minutes 0 times per week.  Today's visit was #: 3 Starting weight: 288 lbs Starting date: 11/03/2019 Today's weight: 287 lbs Today's date: 01/31/2020 Total lbs lost to date: 1 Total lbs lost since last in-office visit: 0  Interim History: Misty Walsh is up 1 lb since her last visit. She states that she has stopped eating salad and is now eating meat and seafood. She has had 2 previous visits with her last visit being on 11/17/2019.  Subjective:   Vitamin D deficiency. Misty Walsh was prescribed Vitamin D.   Ref. Range 11/03/2019 11:52  Vitamin D, 25-Hydroxy Latest Ref Range: 30.0 - 100.0 ng/mL 16.1 (L)   Essential hypertension. Blood pressure is controlled.  BP Readings from Last 3 Encounters:  01/31/20 112/77  01/25/20 130/80  12/07/19 (!) 151/73   Lab Results  Component Value Date   CREATININE 0.74 11/03/2019   CREATININE 0.90 09/14/2019   CREATININE 0.92 06/11/2017   Prediabetes. Misty Walsh has a diagnosis of prediabetes based on her elevated HgA1c and was informed this puts her at greater risk of developing diabetes. She continues to work on diet and exercise to decrease her risk of diabetes. She denies nausea or hypoglycemia. Misty Walsh reports decreased appetite.  Lab Results  Component Value Date   HGBA1C 6.3 (H) 11/03/2019   Lab Results  Component Value Date   INSULIN 29.5 (H) 11/03/2019   Gastroesophageal reflux disease, unspecified whether esophagitis present. Misty Walsh is on no medication. She reports signs/symptoms 3 times per week since 2014.  Assessment/Plan:   Vitamin D deficiency. Low Vitamin D level contributes to fatigue and are associated with obesity, breast, and  colon cancer. She was given a prescription for Vitamin D, Ergocalciferol, (DRISDOL) 1.25 MG (50000 UNIT) CAPS capsule every week #4 with 0 refills and will follow-up for routine testing of Vitamin D, at least 2-3 times per year to avoid over-replacement.   Essential hypertension. Misty Walsh is working on healthy weight loss and exercise to improve blood pressure control. We will watch for signs of hypotension as she continues her lifestyle modifications. She will continue her medication as directed.   Prediabetes. Misty Walsh will continue to work on weight loss, exercise, increasing healthy fats and protein, and decreasing simple carbohydrates to help decrease the risk of diabetes.   Gastroesophageal reflux disease, unspecified whether esophagitis present. Intensive lifestyle modifications are the first line treatment for this issue. We discussed several lifestyle modifications today and she will continue to work on diet, exercise and weight loss efforts. Orders and follow up as documented in patient record. Prescription was given for pantoprazole 40 mg 1 PO daily #30 with 0 refills.  Counseling . If a person has gastroesophageal reflux disease (GERD), food and stomach acid move back up into the esophagus and cause symptoms or problems such as damage to the esophagus. . Anti-reflux measures include: raising the head of the bed, avoiding tight clothing or belts, avoiding eating late at night, not lying down shortly after mealtime, and achieving weight loss. . Avoid ASA, NSAID's, caffeine, alcohol, and tobacco.  . OTC Pepcid and/or Tums are often very helpful for as needed use.  Marland Kitchen However, for persisting chronic or daily symptoms, stronger medications  like Omeprazole may be needed. . You may need to avoid foods and drinks such as: ? Coffee and tea (with or without caffeine). ? Drinks that contain alcohol. ? Energy drinks and sports drinks. ? Bubbly (carbonated) drinks or sodas. ? Chocolate and  cocoa. ? Peppermint and mint flavorings. ? Garlic and onions. ? Horseradish. ? Spicy and acidic foods. These include peppers, chili powder, curry powder, vinegar, hot sauces, and BBQ sauce. ? Citrus fruit juices and citrus fruits, such as oranges, lemons, and limes. ? Tomato-based foods. These include red sauce, chili, salsa, and pizza with red sauce. ? Fried and fatty foods. These include donuts, french fries, potato chips, and high-fat dressings. ? High-fat meats. These include hot dogs, rib eye steak, sausage, ham, and bacon.  Class 3 severe obesity with serious comorbidity and body mass index (BMI) of 45.0 to 49.9 in adult, unspecified obesity type (HCC).  Misty Walsh is currently in the action stage of change. As such, her goal is to continue with weight loss efforts. She has agreed to practicing portion control and making smarter food choices, such as increasing vegetables and decreasing simple carbohydrates, eating meat and seafood.   She will work on increasing her protein and water intake.  Exercise goals: All adults should avoid inactivity. Some physical activity is better than none, and adults who participate in any amount of physical activity gain some health benefits.  Behavioral modification strategies: increasing lean protein intake, decreasing simple carbohydrates, increasing vegetables, increasing water intake, decreasing eating out, no skipping meals, meal planning and cooking strategies, keeping healthy foods in the home and planning for success.  Misty Walsh has agreed to follow-up with our clinic in 3 weeks. She was informed of the importance of frequent follow-up visits to maximize her success with intensive lifestyle modifications for her multiple health conditions.   Objective:   Blood pressure 112/77, pulse 77, temperature 98.3 F (36.8 C), height 5\' 4"  (1.626 m), weight 287 lb (130.2 kg), last menstrual period 01/31/2020, SpO2 98 %. Body mass index is 49.26  kg/m.  General: Cooperative, alert, well developed, in no acute distress. HEENT: Conjunctivae and lids unremarkable. Cardiovascular: Regular rhythm.  Lungs: Normal work of breathing. Neurologic: No focal deficits.   Lab Results  Component Value Date   CREATININE 0.74 11/03/2019   BUN 7 11/03/2019   NA 140 11/03/2019   K 4.1 11/03/2019   CL 106 11/03/2019   CO2 21 11/03/2019   Lab Results  Component Value Date   ALT 20 11/03/2019   AST 19 11/03/2019   ALKPHOS 84 11/03/2019   BILITOT 0.3 11/03/2019   Lab Results  Component Value Date   HGBA1C 6.3 (H) 11/03/2019   HGBA1C 5.1 09/16/2012   Lab Results  Component Value Date   INSULIN 29.5 (H) 11/03/2019   Lab Results  Component Value Date   TSH 3.250 09/14/2019   Lab Results  Component Value Date   CHOL 244 (H) 11/03/2019   HDL 47 11/03/2019   LDLCALC 167 (H) 11/03/2019   TRIG 165 (H) 11/03/2019   Lab Results  Component Value Date   WBC 7.4 09/14/2019   HGB 13.8 09/14/2019   HCT 39.9 09/14/2019   MCV 92 09/14/2019   PLT 238 09/14/2019   No results found for: IRON, TIBC, FERRITIN  Attestation Statements:   Reviewed by clinician on day of visit: allergies, medications, problem list, medical history, surgical history, family history, social history, and previous encounter notes.  09/16/2019, am acting as Fernanda Drum  for Corinna Capra, DO   I have reviewed the above documentation for accuracy and completeness, and I agree with the above. Corinna Capra, DO

## 2020-02-01 ENCOUNTER — Encounter (INDEPENDENT_AMBULATORY_CARE_PROVIDER_SITE_OTHER): Payer: Self-pay | Admitting: Bariatrics

## 2020-02-01 ENCOUNTER — Telehealth (INDEPENDENT_AMBULATORY_CARE_PROVIDER_SITE_OTHER): Payer: Self-pay | Admitting: Bariatrics

## 2020-02-01 NOTE — Telephone Encounter (Signed)
Medicaid doesn't cover Ozempic and she will need something Medicaid will cover.

## 2020-02-02 NOTE — Telephone Encounter (Signed)
Call pt. I do not know what Medicaid will cover, but we can discuss at her next visit. It is policy to only prescribe medications at the visit.

## 2020-02-02 NOTE — Telephone Encounter (Signed)
Please review, thank you.

## 2020-02-02 NOTE — Telephone Encounter (Signed)
Pt.notified

## 2020-02-03 ENCOUNTER — Ambulatory Visit: Payer: Medicaid Other | Admitting: Physical Therapy

## 2020-02-03 ENCOUNTER — Encounter: Payer: Self-pay | Admitting: Physical Therapy

## 2020-02-03 VITALS — BP 175/80 | HR 95

## 2020-02-03 DIAGNOSIS — R209 Unspecified disturbances of skin sensation: Secondary | ICD-10-CM

## 2020-02-03 DIAGNOSIS — M62838 Other muscle spasm: Secondary | ICD-10-CM

## 2020-02-03 DIAGNOSIS — M6281 Muscle weakness (generalized): Secondary | ICD-10-CM

## 2020-02-03 DIAGNOSIS — M546 Pain in thoracic spine: Secondary | ICD-10-CM

## 2020-02-03 DIAGNOSIS — M542 Cervicalgia: Secondary | ICD-10-CM

## 2020-02-03 DIAGNOSIS — R293 Abnormal posture: Secondary | ICD-10-CM | POA: Diagnosis not present

## 2020-02-03 NOTE — Therapy (Signed)
[facility] Outpatient Rehabilitation [facility] [address] [place], [place], [address] Phone: [phone]   Fax:  [phone]  Physical Therapy Treatment  Patient Details  Name: Misty Walsh MRN: [id number] Date of Birth: 18-Aug-1990 Referring Provider (PT): Peggye Form, DO    Encounter Date: 02/03/2020   PT End of Session - 02/03/20 1148    Visit Number 2    Number of Visits 7    Date for PT Re-Evaluation 03/04/20    Authorization Type [place] Medicaid [employer]    PT Start Time 1148    PT Stop Time 1226    PT Time Calculation (min) 38 min           Past Medical History:  Diagnosis Date  . Anxiety   . Back pain   . Chlamydia   . Depression med made her navel itching and  made her sleepy so she quit taking them  . Edema, lower extremity   . Elevated cholesterol   . GERD (gastroesophageal reflux disease)   . History of cesarean section, classical 12/18/2012   2014   . Human papilloma virus   . Hypertension   . Joint pain   . Moderate dysplasia of cervix   . Numbness    right arm to hand  . Ovarian cyst   . Prediabetes     Past Surgical History:  Procedure Laterality Date  . BREAST BIOPSY Left 2012   benign fibroadeoma  . BREAST SURGERY    . CESAREAN SECTION N/A 12/13/2012   Procedure: PRIMARY CESAREAN SECTION;  Surgeon: Kathreen Cosier, MD;  Location: [facility];  Service: Obstetrics;  Laterality: N/A;  . LEEP      Vitals:   02/03/20 1257  BP: (!) 175/80  Pulse: 95  SpO2: 97%     Subjective Assessment - 02/03/20 1149    Subjective I am tense as always.              [facility] PT Assessment - 02/03/20 0001      Strength   Strength Assessment Site Hand    Right/Left hand Right;Left    Right Hand Grip (lbs) 28    Left Hand Grip (lbs) 42                         [facility] Adult PT Treatment/Exercise - 02/03/20 0001      Shoulder Exercises: Seated   Other Seated Exercises red band seated horizontal abduction  and ER      Shoulder Exercises: Standing   Extension 20 reps    Theraband Level (Shoulder Extension) Level 3 (Green)    Row 20 reps    Theraband Level (Shoulder Row) Level 3 (Green)      Neck Exercises: Stretches   Upper Trapezius Stretch 3 reps    Levator Stretch 3 reps    Corner Stretch Limitations doorways stretch x 4    Other Neck Stretches seated thoracic extension in chair x 10    Other Neck Stretches open books                    PT Short Term Goals - 01/20/20 1218      PT SHORT TERM GOAL #1   Title Patient will be independent with initial HEP.    Baseline Pt provided with initial HEP during evaluation 01/20/2020.    Time 3    Period Weeks    Status New    Target Date  03/02/20      PT SHORT TERM GOAL #2   Title Patient will be able to report no episodes of suddenly dropping an object within a 2 week timeframe.    Baseline Pt randomly drops items occasionally with the last time being 2 weeks ago.    Time 3    Period Weeks    Status New    Target Date 02/10/20      PT SHORT TERM GOAL #3   Title Pt will be able to sit for at least 20 minutes without having to readjust due to significant discomfort/pain.    Baseline Pt states she has to readjust constantly due to discomfort which was also observed throughout evaluation session.    Time 3    Period Weeks    Status New    Target Date 02/10/20      PT SHORT TERM GOAL #4   Title Patient will demonstrate upright posture in sitting rather than slouched posture throughout treatment session with minimal to no cues.    Baseline Slouched sitting posture with cues/education regarding postural correction    Time 3    Period Weeks    Status New    Target Date 02/10/20             PT Long Term Goals - 01/20/20 1226      PT LONG TERM GOAL #1   Title Pt will be independent with advanced HEP.    Baseline Pt provided with initial HEP during evaluation 01/20/2020.    Time 6    Period Weeks    Status New     Target Date 03/02/20      PT LONG TERM GOAL #2   Title Pt will demonstrate 5/5 BUE MMT grossly.    Baseline 4/5    Time 6    Period Weeks    Status New    Target Date 03/02/20      PT LONG TERM GOAL #3   Title Patient will be able to report no incidents of dropping items for at least 5 weeks.    Baseline Pt randomly drops items occasionally with the last time being 2 weeks ago.    Time 6    Period Weeks    Status New    Target Date 03/02/20                 Plan - 02/03/20 1149    Clinical Impression Statement Right hand dominant and grip strength 28 right verses 42 left. SHe reports compliance with HEP. Continued with postural exercises and thoracic mobility. Issed updated HEP. Pt reported feeling looser after open book stretch.    PT Next Visit Plan Review and update HEP PRN,  (consider BLE strength - ask pt if she still has N/T in BLE), sensation assessment, manual therapy (STM/myofascial release, cervical and thoracic joint mobilizations, manual cervical traction), postural exercises/periscapular strengthening, demo use of tennis ball/thera-cane for self STM/myofascial release    PT Home Exercise Plan [id number] - upper trap and levator scap stretches, scapular retraction, chin tucks (cervical retraction), ulnar nerve glides (trial to see if any improvement of UE symptoms occurs), added open books, thoracic chair extension, doorway stretch, red band horizontal and ER           Patient will benefit from skilled therapeutic intervention in order to improve the following deficits and impairments:  Postural dysfunction,Pain,Decreased strength,Impaired UE functional use,Increased muscle spasms,Obesity,Improper body mechanics  Visit Diagnosis: Abnormal posture  Cervicalgia  Pain  in thoracic spine  Other muscle spasm  Muscle weakness (generalized)  Unspecified disturbances of skin sensation     Problem List Patient Active Problem List   Diagnosis Date Noted  .  Symptomatic mammary hypertrophy 12/07/2019  . Back pain 12/07/2019  . Neck pain 12/07/2019  . Morbid obesity (HCC) 11/09/2019  . Prediabetes 11/04/2019  . Elevated cholesterol 11/04/2019  . Vitamin D deficiency 11/04/2019  . Hemorrhagic ovarian cyst 06/21/2017  . 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 Cellulitis 01/06/2013  . History of cesarean section, classical 12/18/2012  . Allergy 10/29/2012    Sherrie Mustache, PTA 02/03/2020, 1:05 PM  [facility] [address] [place], [place], [address] Phone: [phone]   Fax:  [phone]  Name: STARLENA BEIL MRN: [id number] Date of Birth: 1990-10-04

## 2020-02-10 ENCOUNTER — Ambulatory Visit: Payer: Medicaid Other | Admitting: Physical Therapy

## 2020-02-15 ENCOUNTER — Ambulatory Visit (HOSPITAL_COMMUNITY): Admit: 2020-02-15 | Discharge status: Against Medical Advice | Payer: Medicaid Other

## 2020-02-17 ENCOUNTER — Ambulatory Visit: Payer: Medicaid Other

## 2020-02-21 ENCOUNTER — Encounter (INDEPENDENT_AMBULATORY_CARE_PROVIDER_SITE_OTHER): Payer: Self-pay | Admitting: Bariatrics

## 2020-02-22 ENCOUNTER — Other Ambulatory Visit (INDEPENDENT_AMBULATORY_CARE_PROVIDER_SITE_OTHER): Payer: Self-pay | Admitting: Bariatrics

## 2020-02-22 ENCOUNTER — Encounter (INDEPENDENT_AMBULATORY_CARE_PROVIDER_SITE_OTHER): Payer: Self-pay | Admitting: Bariatrics

## 2020-02-22 ENCOUNTER — Telehealth (INDEPENDENT_AMBULATORY_CARE_PROVIDER_SITE_OTHER): Payer: Medicaid Other | Admitting: Bariatrics

## 2020-02-22 DIAGNOSIS — R7303 Prediabetes: Secondary | ICD-10-CM

## 2020-02-22 DIAGNOSIS — E559 Vitamin D deficiency, unspecified: Secondary | ICD-10-CM

## 2020-02-22 DIAGNOSIS — I10 Essential (primary) hypertension: Secondary | ICD-10-CM

## 2020-02-22 DIAGNOSIS — Z6841 Body Mass Index (BMI) 40.0 and over, adult: Secondary | ICD-10-CM | POA: Diagnosis not present

## 2020-02-22 MED ORDER — VITAMIN D (ERGOCALCIFEROL) 1.25 MG (50000 UNIT) PO CAPS: 50000.0000 [IU] | ORAL_CAPSULE | ORAL | 0 refills | Status: DC

## 2020-02-22 MED ORDER — VICTOZA 18 MG/3ML ~~LOC~~ SOPN: PEN_INJECTOR | SUBCUTANEOUS | 0 refills | Status: DC

## 2020-02-22 MED ORDER — INSULIN PEN NEEDLE 31G X 6 MM MISC: 0 refills | Status: DC

## 2020-02-22 NOTE — Telephone Encounter (Signed)
Pt last seen by Dr. Brown.  

## 2020-02-22 NOTE — Telephone Encounter (Signed)
Refill request, Pt has TH visit today

## 2020-02-23 ENCOUNTER — Encounter (INDEPENDENT_AMBULATORY_CARE_PROVIDER_SITE_OTHER): Payer: Self-pay | Admitting: Bariatrics

## 2020-02-23 NOTE — Progress Notes (Signed)
TeleHealth Visit:  Due to the COVID-19 pandemic, this visit was completed with telemedicine (audio/video) technology to reduce patient and provider exposure as well as to preserve personal protective equipment.   Allyne has verbally consented to this TeleHealth visit. The patient is located at home, the provider is located at the [facility] office. The participants in this visit include the listed provider and patient. The visit was conducted today via telephone.  Video visit was attempted 3 times, but we were unable to connect. The time spent was 20 minutes including pre-visit chart review and post-visit care.   Rashidah was unable to use realtime audiovisual technology today and the telehealth visit was conducted via telephone.  Chief Complaint: OBESITY Alois is here to discuss her progress with her obesity treatment plan along with follow-up of her obesity related diagnoses. Wyolene is on the Category 3 Plan and states she is following her eating plan approximately 45% of the time. Myrissa states she is not exercising regularly at this time.  Today's visit was #: 4 Starting weight: 288 lbs Starting date: 11/03/2019  Interim History: Lachandra states that her weight is the same.  She has had a diagnosis of COVID-19.  Subjective:   1. Essential hypertension Review: taking medications as instructed, no medication side effects noted, no chest pain on exertion, no dyspnea on exertion, no swelling of ankles.  Christinia is taking lisinopril/HCTZ.  BP Readings from Last 3 Encounters:  02/03/20 (!) 175/80  01/31/20 112/77  01/25/20 130/80   2. Prediabetes Brody has a diagnosis of prediabetes based on her elevated HgA1c and was informed this puts her at greater risk of developing diabetes. She continues to work on diet and exercise to decrease her risk of diabetes. She denies nausea or hypoglycemia.  Bonney was prescribed Ozempic, but it was not covered by insurance, per pharmacy.     Lab Results  Component Value Date   HGBA1C 6.3 (H) 11/03/2019   Lab Results  Component Value Date   INSULIN 29.5 (H) 11/03/2019   Assessment/Plan:   1. Essential hypertension Joan is working on healthy weight loss and exercise to improve blood pressure control. We will watch for signs of hypotension as she continues her lifestyle modifications.  She will continue her medication as prescribed.   2. Prediabetes Henretter will continue to work on weight loss, exercise, and decreasing simple carbohydrates to help decrease the risk of diabetes.  Will change to Victoza 0.6 mg subcutaneously daily for 7 days, then 1.2 mg subcutaneously daily.  This will need prior authorization.  Pen needles will also be sent to her pharmacy to use with Victoza, as per below.  -Start liraglutide (VICTOZA) 18 MG/3ML SOPN; Start 0.6mg  SQ once a day for 7 days, then increase to 1.2mg  once a day  Dispense: 6 mL; Refill: 0 -Start Insulin Pen Needle 31G X 6 MM MISC; Use daily with Victoza  Dispense: 50 each; Refill: 0  3. Class 3 severe obesity with serious comorbidity and body mass index (BMI) of 45.0 to 49.9 in adult, unspecified obesity type (HCC)  Glorya is currently in the action stage of change. As such, her goal is to continue with weight loss efforts. She has agreed to the Category 3 Plan.   She will work on meal planning and intentional eating.  Exercise goals: All adults should avoid inactivity. Some physical activity is better than none, and adults who participate in any amount of physical activity gain some health benefits.  Behavioral  modification strategies: increasing lean protein intake, decreasing simple carbohydrates, increasing vegetables, increasing water intake, decreasing eating out, no skipping meals, meal planning and cooking strategies, keeping healthy foods in the home and planning for success.  Esmae has agreed to follow-up with our clinic in 2-3 weeks, fasting for labs. She was  informed of the importance of frequent follow-up visits to maximize her success with intensive lifestyle modifications for her multiple health conditions.  Objective:   VITALS: Per patient if applicable, see vitals. GENERAL: Alert and in no acute distress. CARDIOPULMONARY: No increased WOB. Speaking in clear sentences.  PSYCH: Pleasant and cooperative. Speech normal rate and rhythm. Affect is appropriate. Insight and judgement are appropriate. Attention is focused, linear, and appropriate.  NEURO: Oriented as arrived to appointment on time with no prompting.   Lab Results  Component Value Date   CREATININE 0.74 11/03/2019   BUN 7 11/03/2019   NA 140 11/03/2019   K 4.1 11/03/2019   CL 106 11/03/2019   CO2 21 11/03/2019   Lab Results  Component Value Date   ALT 20 11/03/2019   AST 19 11/03/2019   ALKPHOS 84 11/03/2019   BILITOT 0.3 11/03/2019   Lab Results  Component Value Date   HGBA1C 6.3 (H) 11/03/2019   HGBA1C 5.1 09/16/2012   Lab Results  Component Value Date   INSULIN 29.5 (H) 11/03/2019   Lab Results  Component Value Date   TSH 3.250 09/14/2019   Lab Results  Component Value Date   CHOL 244 (H) 11/03/2019   HDL 47 11/03/2019   LDLCALC 167 (H) 11/03/2019   TRIG 165 (H) 11/03/2019   Lab Results  Component Value Date   WBC 7.4 09/14/2019   HGB 13.8 09/14/2019   HCT 39.9 09/14/2019   MCV 92 09/14/2019   PLT 238 09/14/2019   Attestation Statements:   Reviewed by clinician on day of visit: allergies, medications, problem list, medical history, surgical history, family history, social history, and previous encounter notes.    I, Insurance claims handler, CMA, am acting as Energy manager for [employer], DO  I have reviewed the above documentation for accuracy and completeness, and I agree with the above. Corinna Capra, DO

## 2020-02-24 ENCOUNTER — Ambulatory Visit: Payer: Medicaid Other

## 2020-03-02 ENCOUNTER — Encounter (INDEPENDENT_AMBULATORY_CARE_PROVIDER_SITE_OTHER): Payer: Self-pay | Admitting: Bariatrics

## 2020-03-02 ENCOUNTER — Ambulatory Visit: Payer: Medicaid Other

## 2020-03-03 ENCOUNTER — Ambulatory Visit: Payer: Medicaid Other | Attending: Plastic Surgery

## 2020-03-03 ENCOUNTER — Ambulatory Visit: Payer: Medicaid Other | Admitting: Physical Therapy

## 2020-03-03 ENCOUNTER — Other Ambulatory Visit: Payer: Self-pay

## 2020-03-03 DIAGNOSIS — M6281 Muscle weakness (generalized): Secondary | ICD-10-CM | POA: Insufficient documentation

## 2020-03-03 DIAGNOSIS — M546 Pain in thoracic spine: Secondary | ICD-10-CM | POA: Diagnosis present

## 2020-03-03 DIAGNOSIS — R293 Abnormal posture: Secondary | ICD-10-CM | POA: Insufficient documentation

## 2020-03-03 DIAGNOSIS — M542 Cervicalgia: Secondary | ICD-10-CM | POA: Diagnosis present

## 2020-03-03 DIAGNOSIS — R209 Unspecified disturbances of skin sensation: Secondary | ICD-10-CM | POA: Insufficient documentation

## 2020-03-03 DIAGNOSIS — M62838 Other muscle spasm: Secondary | ICD-10-CM | POA: Diagnosis present

## 2020-03-03 NOTE — Therapy (Addendum)
[facility] Outpatient Rehabilitation [facility] [address] [place], [place], [address] Phone: [phone]   Fax:  [phone]  Physical Therapy Treatment/Re-Certification  Patient Details  Name: Misty Walsh MRN: [id number] Date of Birth: April 01, 1990 Referring Provider (PT): Ulice Bold Alena Bills, DO    Encounter Date: 03/03/2020   PT End of Session - 03/03/20 1349    Visit Number 3    Number of Visits 7    Date for PT Re-Evaluation 04/14/20    Authorization Type [place] Medicaid [employer]    PT Start Time 1308    PT Stop Time 1355    PT Time Calculation (min) 47 min    Activity Tolerance Patient tolerated treatment well    Behavior During Therapy [facility] for tasks assessed/performed           Past Medical History:  Diagnosis Date  . Anxiety   . Back pain   . Chlamydia   . Depression med made her navel itching and  made her sleepy so she quit taking them  . Edema, lower extremity   . Elevated cholesterol   . GERD (gastroesophageal reflux disease)   . History of cesarean section, classical 12/18/2012   2014   . Human papilloma virus   . Hypertension   . Joint pain   . Moderate dysplasia of cervix   . Numbness    right arm to hand  . Ovarian cyst   . Prediabetes     Past Surgical History:  Procedure Laterality Date  . BREAST BIOPSY Left 2012   benign fibroadeoma  . BREAST SURGERY    . CESAREAN SECTION N/A 12/13/2012   Procedure: PRIMARY CESAREAN SECTION;  Surgeon: Kathreen Cosier, MD;  Location: [facility];  Service: Obstetrics;  Laterality: N/A;  . LEEP      There were no vitals filed for this visit.   Subjective Assessment - 03/03/20 2142    Subjective Pt reports the the numbness and weakness of her hands is better. She rates her neck pain a 10/10.    Pertinent History anxiety, depression, HTN, prediabetes (see extensive PMH above)    Limitations Sitting;Lifting;House hold activities    How long can you sit comfortably? "I have to readjust a  lot"    How long can you stand comfortably? "I prefer to stand up most of the time and try to stretch"    How long can you walk comfortably? No issues with walking    Diagnostic tests Cervical MRI 10/12/2019: Unremarkable MRI cervical spine (with and without), no spinal stenosis or foraminal narrowing, and no spinal cord lesions.    Patient Stated Goals Just getting rid of the discomfort/pain and not worrying about dropping objects    Currently in Pain? Yes    Pain Score 10-Worst pain ever    Pain Location Neck    Pain Orientation Lower    Pain Descriptors / Indicators Aching;Tightness    Pain Type Chronic pain    Pain Onset More than a month ago    Pain Frequency Intermittent                             [facility] Adult PT Treatment/Exercise - 03/03/20 0001      Shoulder Exercises: Seated   Other Seated Exercises green band seated horizontal abduction and ER      Shoulder Exercises: Standing   Extension 20 reps    Theraband Level (Shoulder Extension) Level 3 (Green)  Row 20 reps    Theraband Level (Shoulder Row) Level 3 (Green)      Neck Exercises: Stretches   Upper Trapezius Stretch 3 reps;20 seconds    Levator Stretch 3 reps;20 seconds    Corner Stretch Limitations doorways stretch x 4, 20 sec each    Other Neck Stretches seated thoracic extension in chair x 10    Other Neck Stretches open book; SL; L and R 15x each                  PT Education - 03/03/20 2146    Education Details HEP    Person(s) Educated Patient    Methods Explanation;Demonstration;Tactile cues;Verbal cues;Handout;Other (comment)    Comprehension Verbalized understanding;Returned demonstration;Verbal cues required;Tactile cues required            PT Short Term Goals - 03/03/20 2153      PT SHORT TERM GOAL #1   Title Patient will be independent with initial HEP.    Status Achieved    Target Date 03/03/20      PT SHORT TERM GOAL #2   Title Patient will be able to report  no episodes of suddenly dropping an object within a 2 week timeframe. 1/14/202: On-going    Status On-going      PT SHORT TERM GOAL #4   Title Patient will demonstrate upright posture in sitting rather than slouched posture throughout treatment session with minimal to no cues.    Status Achieved    Target Date 03/03/20             PT Long Term Goals - 03/03/20 2216      PT LONG TERM GOAL #1   Title Pt will be independent with advanced HEP.    Baseline Pt provided with initial HEP during evaluation 01/20/2020.    Status Revised    Target Date 04/14/20      PT LONG TERM GOAL #2   Title Pt will demonstrate 5/5 BUE MMT grossly.    Status New    Target Date 04/14/20      PT LONG TERM GOAL #3   Title Patient will be able to report no incidents of dropping items for at least 5 weeks.    Baseline Pt randomly drops items occasionally with the last time being 2 weeks ago.    Status Revised    Target Date 04/14/20                 Plan - 03/03/20 1350    Clinical Impression Statement Pt has participated in 3 of 7 recommended PT sessions. PT was provided for cervical and shoulder girdle flexibility and posterior chain strengthening. Pt demonstrated proper completion of her HEP and proper sitting posture during the course of the PT session. Pt's subjective report indicates improvement of the the numbness and weakness she has been experincing in her hands. After today's PT session, pt reported her neck felt better. Pt will benefit from continued PT 1w4 to address the muscle imbalances of her upper body to minimize the the pain, numbness, and weakness to optimize posture and the functional use of her UEs    Personal Factors and Comorbidities Comorbidity 1;Comorbidity 2;Comorbidity 3+;Past/Current Experience;Time since onset of injury/illness/exacerbation    Comorbidities anxiety, depression, HTN, prediabetes (see extensive PMH above)    Examination-Activity Limitations Carry;Caring for  Others;Lift    Examination-Participation Restrictions Cleaning;Shop;Community Activity    Stability/Clinical Decision Making Evolving/Moderate complexity    Clinical Decision Making Moderate  Rehab Potential Good    PT Frequency 1x / week    PT Duration 4 weeks    PT Treatment/Interventions ADLs/Self Care Home Management;Aquatic Therapy;Cryotherapy;Iontophoresis 4mg /ml Dexamethasone;Moist Heat;Traction;Electrical Stimulation;Ultrasound;Functional mobility training;Therapeutic activities;Therapeutic exercise;Neuromuscular re-education;Manual techniques;Patient/family education;Dry needling;Passive range of motion;Energy conservation;Taping    PT Next Visit Plan Review and update HEP PRN,  (consider BLE strength - ask pt if she still has N/T in BLE), sensation assessment, manual therapy (STM/myofascial release, cervical and thoracic joint mobilizations, manual cervical traction), postural exercises/periscapular strengthening, demo use of tennis ball/thera-cane for self STM/myofascial release    PT Home Exercise Plan [id number] - upper trap and levator scap stretches, scapular retraction, chin tucks (cervical retraction), ulnar nerve glides (trial to see if any improvement of UE symptoms occurs), added open books, thoracic chair extension, doorway stretch, red band horizontal and ER    Consulted and Agree with Plan of Care Patient           Patient will benefit from skilled therapeutic intervention in order to improve the following deficits and impairments:  Postural dysfunction,Pain,Decreased strength,Impaired UE functional use,Increased muscle spasms,Obesity,Improper body mechanics  Visit Diagnosis: Abnormal posture  Cervicalgia  Pain in thoracic spine  Other muscle spasm  Muscle weakness (generalized)  Unspecified disturbances of skin sensation     Problem List Patient Active Problem List   Diagnosis Date Noted  . Symptomatic mammary hypertrophy 12/07/2019  . Back pain  12/07/2019  . Neck pain 12/07/2019  . Morbid obesity (HCC) 11/09/2019  . Prediabetes 11/04/2019  . Elevated cholesterol 11/04/2019  . Vitamin D deficiency 11/04/2019  . Hemorrhagic ovarian cyst 06/21/2017  . 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 Cellulitis 01/06/2013  . History of cesarean section, classical 12/18/2012  . Allergy 10/29/2012    12/29/2012 MS, PT 03/03/20 10:17 PM  [facility] [address] [place], [place], [place] Phone: [phone]   Fax:  [phone]  Name: Misty Walsh MRN: [name] Date of Birth: Sep 23, 1990   Check all possible CPT codes: 97110- Therapeutic Exercise, 97140 - Manual Therapy, 97530 - Therapeutic Activities, 97535 - Self Care, [id number] - Mechanical traction, 97014 - Electrical stimulation (unattended), 99242 - Electrical stimulation (Manual), [id number] - Iontophoresis and [id number] - Ultrasound

## 2020-03-04 ENCOUNTER — Encounter: Payer: Self-pay | Admitting: Plastic Surgery

## 2020-03-23 ENCOUNTER — Other Ambulatory Visit (INDEPENDENT_AMBULATORY_CARE_PROVIDER_SITE_OTHER): Payer: Self-pay | Admitting: Bariatrics

## 2020-03-23 DIAGNOSIS — E559 Vitamin D deficiency, unspecified: Secondary | ICD-10-CM

## 2020-03-23 NOTE — Telephone Encounter (Signed)
Dr.Brown 

## 2020-04-04 ENCOUNTER — Encounter (INDEPENDENT_AMBULATORY_CARE_PROVIDER_SITE_OTHER): Payer: Self-pay | Admitting: Bariatrics

## 2020-04-04 ENCOUNTER — Other Ambulatory Visit (INDEPENDENT_AMBULATORY_CARE_PROVIDER_SITE_OTHER): Payer: Self-pay | Admitting: Bariatrics

## 2020-04-04 DIAGNOSIS — R7303 Prediabetes: Secondary | ICD-10-CM

## 2020-04-04 NOTE — Telephone Encounter (Signed)
Last OV with Dr Brown 

## 2020-04-06 ENCOUNTER — Ambulatory Visit (INDEPENDENT_AMBULATORY_CARE_PROVIDER_SITE_OTHER): Payer: Medicaid Other | Admitting: Family Medicine

## 2020-04-06 ENCOUNTER — Other Ambulatory Visit: Payer: Self-pay

## 2020-04-06 ENCOUNTER — Encounter (INDEPENDENT_AMBULATORY_CARE_PROVIDER_SITE_OTHER): Payer: Self-pay | Admitting: Family Medicine

## 2020-04-06 VITALS — BP 126/86 | HR 92 | Temp 98.1°F | Ht 64.0 in | Wt 291.0 lb

## 2020-04-06 DIAGNOSIS — Z6841 Body Mass Index (BMI) 40.0 and over, adult: Secondary | ICD-10-CM

## 2020-04-06 DIAGNOSIS — R7303 Prediabetes: Secondary | ICD-10-CM

## 2020-04-06 DIAGNOSIS — E559 Vitamin D deficiency, unspecified: Secondary | ICD-10-CM

## 2020-04-06 DIAGNOSIS — F419 Anxiety disorder, unspecified: Secondary | ICD-10-CM | POA: Diagnosis not present

## 2020-04-06 DIAGNOSIS — F32A Depression, unspecified: Secondary | ICD-10-CM

## 2020-04-06 MED ORDER — BUSPIRONE HCL 7.5 MG PO TABS: 7.5000 mg | ORAL_TABLET | Freq: Three times a day (TID) | ORAL | 0 refills | Status: DC | PRN

## 2020-04-06 MED ORDER — SERTRALINE HCL 50 MG PO TABS: 25.0000 mg | ORAL_TABLET | Freq: Every day | ORAL | 0 refills | Status: DC

## 2020-04-06 MED ORDER — VICTOZA 18 MG/3ML ~~LOC~~ SOPN: 1.2000 mg | PEN_INJECTOR | Freq: Every day | SUBCUTANEOUS | 0 refills | Status: DC

## 2020-04-06 MED ORDER — VITAMIN D (ERGOCALCIFEROL) 1.25 MG (50000 UNIT) PO CAPS: 50000.0000 [IU] | ORAL_CAPSULE | ORAL | 0 refills | Status: DC

## 2020-04-07 ENCOUNTER — Ambulatory Visit: Payer: Medicaid Other | Attending: Plastic Surgery

## 2020-04-07 DIAGNOSIS — M6281 Muscle weakness (generalized): Secondary | ICD-10-CM | POA: Insufficient documentation

## 2020-04-07 DIAGNOSIS — R209 Unspecified disturbances of skin sensation: Secondary | ICD-10-CM | POA: Diagnosis present

## 2020-04-07 DIAGNOSIS — M546 Pain in thoracic spine: Secondary | ICD-10-CM | POA: Insufficient documentation

## 2020-04-07 DIAGNOSIS — R293 Abnormal posture: Secondary | ICD-10-CM | POA: Insufficient documentation

## 2020-04-07 DIAGNOSIS — M62838 Other muscle spasm: Secondary | ICD-10-CM | POA: Insufficient documentation

## 2020-04-07 DIAGNOSIS — M542 Cervicalgia: Secondary | ICD-10-CM | POA: Diagnosis present

## 2020-04-07 NOTE — Therapy (Signed)
[facility] [address] [place], [place], [address] Phone: [phone]   Fax:  [phone]  Physical Therapy Treatment/Re-Certification/Re-authorization  Patient Details  Name: Misty Walsh MRN: [id number] Date of Birth: [birth year] Referring Provider (PT): Ulice Bold Alena Bills, DO    Encounter Date: 04/07/2020   PT End of Session - 04/07/20 1200    Visit Number 4    Number of Visits 7    Date for PT Re-Evaluation 04/14/20    Authorization Type [place] Medicaid [employer]    PT Start Time 1138    PT Stop Time 1217    PT Time Calculation (min) 39 min    Activity Tolerance Patient tolerated treatment well    Behavior During Therapy [facility] for tasks assessed/performed           Past Medical History:  Diagnosis Date  . Anxiety   . Back pain   . Chlamydia   . Depression med made her navel itching and  made her sleepy so she quit taking them  . Edema, lower extremity   . Elevated cholesterol   . GERD (gastroesophageal reflux disease)   . History of cesarean section, classical 12/18/2012   2014   . Human papilloma virus   . Hypertension   . Joint pain   . Moderate dysplasia of cervix   . Numbness    right arm to hand  . Ovarian cyst   . Prediabetes     Past Surgical History:  Procedure Laterality Date  . BREAST BIOPSY Left 2012   benign fibroadeoma  . BREAST SURGERY    . CESAREAN SECTION N/A 12/13/2012   Procedure: PRIMARY CESAREAN SECTION;  Surgeon: Kathreen Cosier, MD;  Location: [facility];  Service: Obstetrics;  Laterality: N/A;  . LEEP      There were no vitals filed for this visit.   Subjective Assessment - 04/07/20 1146    Subjective Tension is decreasing and she is not experiencing the numbness of her hands as frequently. Pt states completing her HEP 3x per week.    Diagnostic tests Cervical MRI 10/12/2019: Unremarkable MRI cervical spine (with and without), no spinal stenosis or foraminal narrowing, and no  spinal cord lesions.    Patient Stated Goals Just getting rid of the discomfort/pain and not worrying about dropping objects    Currently in Pain? Yes    Pain Score 4     Pain Location Neck    Pain Orientation Lower    Pain Descriptors / Indicators Aching;Tightness    Pain Type Chronic pain    Pain Onset More than a month ago    Pain Frequency Intermittent    Aggravating Factors  Random onset of symptoms "hard to tell"    Pain Relieving Factors Ther ex/HEP program    Effect of Pain on Daily Activities Difficulty and hesitancy holding items due to dropping items randomly on occasion; difficulty performing daily activities during symptom exacerbation    Multiple Pain Sites No                             [facility] Adult PT Treatment/Exercise - 04/07/20 0001      Exercises   Exercises Shoulder      Shoulder Exercises: Seated   External Rotation Both;15 reps    Theraband Level (Shoulder External Rotation) Level 3 (Green)    Other Seated Exercises green band seated horizontal abduction and ER, 15x  Neck Exercises: Stretches   Upper Trapezius Stretch 3 reps;20 seconds    Levator Stretch 3 reps;20 seconds    Corner Stretch Limitations doorways stretch x 4, 20 sec each    Other Neck Stretches seated thoracic extension in chair x 10    Other Neck Stretches open book; SL; L and R 15x each                    PT Short Term Goals - 04/07/20 1616      PT SHORT TERM GOAL #2   Title Patient will be able to report no episodes of suddenly dropping an object within a 2 week timeframe. 1/14/202: On-going. 04/07/20: On-going-Pt states partly related to numbness and also being clumbsy.    Baseline Pt randomly drops items occasionally with the last time being 2 weeks ago.    Status On-going    Target Date 05/05/20      PT SHORT TERM GOAL #3   Title Pt will be able to sit for at least 20 minutes without having to readjust due to significant discomfort/pain. 04/07/20:  On-going- tp reports she is not having to readjust sitting or her neck as frequently with improved neck pain.    Baseline Pt states she has to readjust constantly due to discomfort which was also observed throughout evaluation session.    Status On-going    Target Date 05/05/20      PT SHORT TERM GOAL #4   Title Patient will demonstrate upright posture in sitting rather than slouched posture throughout treatment session with minimal to no cues.    Baseline Slouched sitting posture with cues/education regarding postural correction    Status Achieved    Target Date 04/07/20             PT Long Term Goals - 04/07/20 1622      PT LONG TERM GOAL #1   Title Pt will be independent with advanced HEP.    Baseline Pt provided with initial HEP during evaluation 01/20/2020.    Status On-going    Target Date 05/05/20      PT LONG TERM GOAL #2   Title Pt will demonstrate 5/5 BUE MMT grossly.    Status Achieved    Target Date 04/07/20      PT LONG TERM GOAL #3   Title Patient will be able to report no incidents of dropping items for at least 5 weeks.    Baseline Pt randomly drops items occasionally with the last time being 2 weeks ago.    Status On-going    Target Date 05/05/20                 Plan - 04/07/20 1627    Clinical Impression Statement Pt's care has been interupted related to her son's schedule for therapy. Pt reports good consitency with her HEP and pt her subjective report indicaes improved numbness of her hands and neck pain. Additionally, pt demonstrates improved sitting posture and strength of her UEs. Pt will benefit from continued PT 1w3 to further address pain, numbness and functional use and tolerance of her neck and upper back.    Personal Factors and Comorbidities Comorbidity 1;Comorbidity 2;Comorbidity 3+;Past/Current Experience;Time since onset of injury/illness/exacerbation    Comorbidities anxiety, depression, HTN, prediabetes (see extensive PMH above)     Examination-Activity Limitations Carry;Caring for Others;Lift    Examination-Participation Restrictions Cleaning;Shop;Community Activity    Stability/Clinical Decision Making Evolving/Moderate complexity    Clinical Decision Making Moderate  Rehab Potential Good    PT Frequency 1x / week    PT Duration 4 weeks    PT Treatment/Interventions ADLs/Self Care Home Management;Aquatic Therapy;Cryotherapy;Iontophoresis 4mg /ml Dexamethasone;Moist Heat;Traction;Electrical Stimulation;Ultrasound;Functional mobility training;Therapeutic activities;Therapeutic exercise;Neuromuscular re-education;Manual techniques;Patient/family education;Dry needling;Passive range of motion;Energy conservation;Taping    PT Next Visit Plan Review and update HEP PRN,  (consider BLE strength - ask pt if she still has N/T in BLE), sensation assessment, manual therapy (STM/myofascial release, cervical and thoracic joint mobilizations, manual cervical traction), postural exercises/periscapular strengthening, demo use of tennis ball/thera-cane for self STM/myofascial release    PT Home Exercise Plan [id number] - upper trap and levator scap stretches, scapular retraction, chin tucks (cervical retraction), ulnar nerve glides (trial to see if any improvement of UE symptoms occurs), added open books, thoracic chair extension, doorway stretch, red band horizontal and ER    Consulted and Agree with Plan of Care Patient           Patient will benefit from skilled therapeutic intervention in order to improve the following deficits and impairments:  Postural dysfunction,Pain,Decreased strength,Impaired UE functional use,Increased muscle spasms,Obesity,Improper body mechanics  Visit Diagnosis: Abnormal posture  Cervicalgia  Pain in thoracic spine  Other muscle spasm  Muscle weakness (generalized)  Unspecified disturbances of skin sensation     Problem List Patient Active Problem List   Diagnosis Date Noted  . Symptomatic  mammary hypertrophy 12/07/2019  . Back pain 12/07/2019  . Neck pain 12/07/2019  . Morbid obesity (HCC) 11/09/2019  . Prediabetes 11/04/2019  . Elevated cholesterol 11/04/2019  . Vitamin D deficiency 11/04/2019  . Hemorrhagic ovarian cyst 06/21/2017  . 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 Cellulitis 01/06/2013  . History of cesarean section, classical 12/18/2012  . Allergy 10/29/2012   12/29/2012 MS, PT 04/07/20 4:36 PM  [facility] [address] [place], [place], [place] Phone: [phone]   Fax:  [phone]  Name: INETA SINNING MRN: [name] Date of Birth: January 04, 1991

## 2020-04-10 NOTE — Progress Notes (Unsigned)
Chief Complaint:   OBESITY Misty Walsh is here to discuss her progress with her obesity treatment plan along with follow-up of her obesity related diagnoses. Misty Walsh is on the Category 3 Plan and states she is following her eating plan approximately 60% of the time. Misty Walsh states she is more active.  Today's visit was #: 5 Starting weight: 288 lbs Starting date: 11/03/2019 Today's weight: 291 lbs Today's date: 04/06/2020 Total lbs lost to date: 0 Total lbs lost since last in-office visit: 0  Interim History: Misty Walsh had a significant amount of familial and professional stressors. She is feeling a little down recently. She is in survival mode in terms of meal plan. She can't eat much on the current Victoza dose of 1.2 mg.  Subjective:   1. Pre-diabetes Misty Walsh is on Victoza 1.2 mg daily currently. She is having significant nausea and anorexia daily. She is struggling to get food in.  2. Vitamin D deficiency Misty Walsh denies nausea, vomiting, and muscle weakness but notes fatigue. Misty Walsh is on prescription Vit D.  3. Anxiety and depression Misty Walsh was previously on Paxil and other antidepressants. She is having symptoms of anhedonia, change in appetite, and changes in sleep.  Assessment/Plan:   1. Pre-diabetes Arbie will continue to work on weight loss, exercise, and decreasing simple carbohydrates to help decrease the risk of diabetes. Decrease Victoza to 0.9 mg daily, as per below.  - liraglutide (VICTOZA) 18 MG/3ML SOPN; Inject 1.2 mg into the skin daily. 1.2mg  once a day  Dispense: 6 mL; Refill: 0  2. Vitamin D deficiency Low Vitamin D level contributes to fatigue and are associated with obesity, breast, and colon cancer. She agrees to continue to take prescription Vitamin D @50 ,000 IU every week and will follow-up for routine testing of Vitamin D, at least 2-3 times per year to avoid over-replacement.  - Vitamin D, Ergocalciferol, (DRISDOL) 1.25 MG (50000 UNIT) CAPS capsule; Take 1 capsule (50,000  Units total) by mouth every 7 (seven) days.  Dispense: 4 capsule; Refill: 0  3. Anxiety and depression Behavior modification techniques were discussed today to help Misty Walsh deal with her anxiety.  Orders and follow up as documented in patient record. Start Zoloft 25 mg, as per below. Start Buspar 7.5 mg, asper below.  - busPIRone (BUSPAR) 7.5 MG tablet; Take 1 tablet (7.5 mg total) by mouth 3 (three) times daily as needed (anxiety).  Dispense: 30 tablet; Refill: 0 - sertraline (ZOLOFT) 50 MG tablet; Take 0.5 tablets (25 mg total) by mouth daily.  Dispense: 30 tablet; Refill: 0  4. Class 3 severe obesity with serious comorbidity and body mass index (BMI) of 50.0 to 59.9 in adult, unspecified obesity type (HCC) Kally is currently in the action stage of change. As such, her goal is to continue with weight loss efforts. She has agreed to the Category 3 Plan.   Exercise goals: As is  Behavioral modification strategies: increasing lean protein intake, meal planning and cooking strategies, keeping healthy foods in the home and planning for success.  Misty Walsh has agreed to follow-up with our clinic in 2 weeks. She was informed of the importance of frequent follow-up visits to maximize her success with intensive lifestyle modifications for her multiple health conditions.   Objective:   Blood pressure 126/86, pulse 92, temperature 98.1 F (36.7 C), temperature source Oral, height 5\' 4"  (1.626 m), weight 291 lb (132 kg), last menstrual period 03/20/2020, SpO2 96 %. Body mass index is 49.95 kg/m.  General: Cooperative, alert, well  developed, in no acute distress. HEENT: Conjunctivae and lids unremarkable. Cardiovascular: Regular rhythm.  Lungs: Normal work of breathing. Neurologic: No focal deficits.   Lab Results  Component Value Date   CREATININE 0.74 11/03/2019   BUN 7 11/03/2019   NA 140 11/03/2019   K 4.1 11/03/2019   CL 106 11/03/2019   CO2 21 11/03/2019   Lab Results  Component Value  Date   ALT 20 11/03/2019   AST 19 11/03/2019   ALKPHOS 84 11/03/2019   BILITOT 0.3 11/03/2019   Lab Results  Component Value Date   HGBA1C 6.3 (H) 11/03/2019   HGBA1C 5.1 09/16/2012   Lab Results  Component Value Date   INSULIN 29.5 (H) 11/03/2019   Lab Results  Component Value Date   TSH 3.250 09/14/2019   Lab Results  Component Value Date   CHOL 244 (H) 11/03/2019   HDL 47 11/03/2019   LDLCALC 167 (H) 11/03/2019   TRIG 165 (H) 11/03/2019   Lab Results  Component Value Date   WBC 7.4 09/14/2019   HGB 13.8 09/14/2019   HCT 39.9 09/14/2019   MCV 92 09/14/2019   PLT 238 09/14/2019    Attestation Statements:   Reviewed by clinician on day of visit: allergies, medications, problem list, medical history, surgical history, family history, social history, and previous encounter notes.  Time spent on visit including pre-visit chart review and post-visit care and charting was 20 minutes.   Edmund Hilda, am acting as transcriptionist for Reuben Likes, MD.   I have reviewed the above documentation for accuracy and completeness, and I agree with the above. - Katherina Mires, MD

## 2020-04-13 ENCOUNTER — Ambulatory Visit: Payer: Medicaid Other | Admitting: Physical Therapy

## 2020-04-18 ENCOUNTER — Ambulatory Visit (INDEPENDENT_AMBULATORY_CARE_PROVIDER_SITE_OTHER): Payer: Medicaid Other | Admitting: Plastic Surgery

## 2020-04-18 ENCOUNTER — Other Ambulatory Visit: Payer: Self-pay

## 2020-04-18 ENCOUNTER — Encounter: Payer: Self-pay | Admitting: Plastic Surgery

## 2020-04-18 VITALS — BP 150/97 | HR 82 | Ht 64.0 in | Wt 291.8 lb

## 2020-04-18 DIAGNOSIS — N62 Hypertrophy of breast: Secondary | ICD-10-CM | POA: Diagnosis not present

## 2020-04-18 DIAGNOSIS — G8929 Other chronic pain: Secondary | ICD-10-CM | POA: Diagnosis not present

## 2020-04-18 DIAGNOSIS — M546 Pain in thoracic spine: Secondary | ICD-10-CM

## 2020-04-18 DIAGNOSIS — M542 Cervicalgia: Secondary | ICD-10-CM

## 2020-04-18 NOTE — Progress Notes (Signed)
Patient ID: Misty Walsh, female    DOB: March 11, 1990, 30 y.o.   MRN: [id number]   Chief Complaint  Patient presents with  . Follow-up    The patient is a 30 year old female here for follow-up on her breast.  She has been seen in the past for mammary hypertrophy.  She is 5 feet 4 inches tall and weighs 291 pounds.  She has lost 2 pounds since her last visit in October.  She stopped smoking in November.  She has been going to physical therapy and has 3 more visits which should be finished by the end of March.  She feels like the physical therapy is helping but she still has neck and back pain.  Her past medical history is positive for HPV, hypertension prediabetes.  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  She is seeing healthy weight and wellness physicians and is hopeful that that is going to be a big help to her.  She has been started on medications.   Review of Systems  Constitutional: Negative.   HENT: Negative.   Eyes: Negative.   Respiratory: Negative.  Negative for chest tightness and shortness of breath.   Cardiovascular: Negative for leg swelling.  Gastrointestinal: Negative for abdominal distention and abdominal pain.  Endocrine: Negative.   Genitourinary: Negative.   Musculoskeletal: Positive for back pain and neck pain.  Neurological: Negative.   Hematological: Negative.   Psychiatric/Behavioral: Negative.     Past Medical History:  Diagnosis Date  . Anxiety   . Back pain   . Chlamydia   . Depression med made her navel itching and  made her sleepy so she quit taking them  . Edema, lower extremity   . Elevated cholesterol   . GERD (gastroesophageal reflux disease)   . History of cesarean section, classical 12/18/2012   2014   . Human papilloma virus   . Hypertension   . Joint pain   . Moderate dysplasia of cervix   . Numbness    right arm to hand  . Ovarian cyst   . Prediabetes     Past Surgical History:  Procedure Laterality  Date  . BREAST BIOPSY Left 2012   benign fibroadeoma  . BREAST SURGERY    . CESAREAN SECTION N/A 12/13/2012   Procedure: PRIMARY CESAREAN SECTION;  Surgeon: Kathreen Cosier, MD;  Location: [facility];  Service: Obstetrics;  Laterality: N/A;  . LEEP        Current Outpatient Medications:  .  atorvastatin (LIPITOR) 20 MG tablet, TK 1 T PO ONCE D HS, Disp: , Rfl:  .  busPIRone (BUSPAR) 7.5 MG tablet, Take 1 tablet (7.5 mg total) by mouth 3 (three) times daily as needed (anxiety)., Disp: 30 tablet, Rfl: 0 .  cetirizine (ZYRTEC) 10 MG tablet, TAKE 1 TABLET BY MOUTH ONCE DAILY FOR 30 DAYS, Disp: , Rfl:  .  Insulin Pen Needle 31G X 6 MM MISC, Use daily with Victoza, Disp: 50 each, Rfl: 0 .  liraglutide (VICTOZA) 18 MG/3ML SOPN, Inject 1.2 mg into the skin daily. 1.2mg  once a day, Disp: 6 mL, Rfl: 0 .  lisinopril-hydrochlorothiazide (PRINZIDE,ZESTORETIC) 10-12.5 MG tablet, TAKE 1 TABLET BY MOUTH ONCE DAILY FOR 30 DAYS, Disp: , Rfl:  .  pantoprazole (PROTONIX) 40 MG tablet, Take 1 tablet (40 mg total) by mouth daily., Disp: 30 tablet, Rfl: 3 .  sertraline (ZOLOFT) 50 MG tablet, Take 0.5 tablets (25 mg total) by  mouth daily., Disp: 30 tablet, Rfl: 0 .  traZODone (DESYREL) 50 MG tablet, Take 1 tablet (50 mg total) by mouth at bedtime., Disp: 30 tablet, Rfl: 2 .  Vitamin D, Ergocalciferol, (DRISDOL) 1.25 MG (50000 UNIT) CAPS capsule, Take 1 capsule (50,000 Units total) by mouth every 7 (seven) days., Disp: 4 capsule, Rfl: 0   Objective:   Vitals:   04/18/20 1032  BP: (!) 150/97  Pulse: 82  SpO2: 97%    Physical Exam Vitals and nursing note reviewed.  Constitutional:      Appearance: Normal appearance.  HENT:     Head: Normocephalic and atraumatic.  Cardiovascular:     Rate and Rhythm: Normal rate.     Pulses: Normal pulses.  Pulmonary:     Effort: Pulmonary effort is normal.  Abdominal:     General: Abdomen is flat. There is no distension.  Neurological:     General: No focal deficit  present.     Mental Status: She is alert and oriented to person, place, and time.  Psychiatric:        Mood and Affect: Mood normal.        Behavior: Behavior normal.        Thought Content: Thought content normal.     Assessment & Plan:  No diagnosis found.  Continue with the healthy weight and wellness center.  Try and get her weight to the 180s.  Give [place] a call in the next several months after she is finished physical therapy and decreased her weight.  Pictures were obtained of the patient and placed in the chart with the patient's or guardian's permission.   Alena Bills [place], DO

## 2020-04-25 ENCOUNTER — Other Ambulatory Visit: Payer: Self-pay

## 2020-04-25 ENCOUNTER — Encounter: Payer: Self-pay | Admitting: Physical Therapy

## 2020-04-25 ENCOUNTER — Ambulatory Visit: Payer: Medicaid Other | Attending: Plastic Surgery | Admitting: Physical Therapy

## 2020-04-25 DIAGNOSIS — R293 Abnormal posture: Secondary | ICD-10-CM | POA: Insufficient documentation

## 2020-04-25 DIAGNOSIS — M546 Pain in thoracic spine: Secondary | ICD-10-CM | POA: Diagnosis present

## 2020-04-25 DIAGNOSIS — M62838 Other muscle spasm: Secondary | ICD-10-CM | POA: Diagnosis present

## 2020-04-25 DIAGNOSIS — M6281 Muscle weakness (generalized): Secondary | ICD-10-CM | POA: Diagnosis present

## 2020-04-25 DIAGNOSIS — M542 Cervicalgia: Secondary | ICD-10-CM | POA: Diagnosis present

## 2020-04-25 NOTE — Therapy (Addendum)
Misty Walsh, [place], [address] Phone: [phone]   Fax:  [phone]  Physical Therapy Treatment/Discharge  Patient Details  Name: Misty Walsh MRN: [id number] Date of Birth: 1990-04-27 Referring Provider (PT): Wallace Going, DO    Encounter Date: 04/25/2020   PT End of Session - 04/25/20 1032    Visit Number 5    Number of Visits 7    Date for PT Re-Evaluation 05/05/20    Authorization Type [place] [employer], 4 visits    Authorization Time Period 04/13/20-05/11/20    Authorization - Visit Number 1    Authorization - Number of Visits 4    PT Start Time 1022    PT Stop Time 1100    PT Time Calculation (min) 38 min           Past Medical History:  Diagnosis Date  . Anxiety   . Back pain   . Chlamydia   . Depression med made her navel itching and  made her sleepy so she quit taking them  . Edema, lower extremity   . Elevated cholesterol   . GERD (gastroesophageal reflux disease)   . History of cesarean section, classical 12/18/2012   2014   . Human papilloma virus   . Hypertension   . Joint pain   . Moderate dysplasia of cervix   . Numbness    right arm to hand  . Ovarian cyst   . Prediabetes     Past Surgical History:  Procedure Laterality Date  . BREAST BIOPSY Left 2012   benign fibroadeoma  . BREAST SURGERY    . CESAREAN SECTION N/A 12/13/2012   Procedure: PRIMARY CESAREAN SECTION;  Surgeon: Frederico Hamman, MD;  Location: [facility];  Service: Obstetrics;  Laterality: N/A;  . LEEP      There were no vitals filed for this visit.       [facility] PT Assessment - 04/25/20 0001      AROM   Cervical - Right Rotation 75    Cervical - Left Rotation 70                         [facility] Adult PT Treatment/Exercise - 04/25/20 0001      Shoulder Exercises: Standing   Horizontal ABduction 15 reps    Theraband Level (Shoulder Horizontal ABduction) Level 2 (Red)     External Rotation 15 reps    Theraband Level (Shoulder External Rotation) Level 2 (Red)    Row 20 reps    Theraband Level (Shoulder Row) Level 3 (Green)      Shoulder Exercises: ROM/Strengthening   UBE (Upper Arm Bike) UBE L1.5 retro 3 minutes      Neck Exercises: Stretches   Upper Trapezius Stretch 3 reps;20 seconds    Levator Stretch 3 reps;20 seconds    Corner Stretch Limitations doorways stretch x 4, 20 sec each    Other Neck Stretches open book; SL; L and R 15x each                    PT Short Term Goals - 04/25/20 1038      PT SHORT TERM GOAL #1   Title Patient will be independent with initial HEP.    Time 3    Period Weeks    Status Achieved      PT SHORT TERM GOAL #2   Title Patient will be able to report  no episodes of suddenly dropping an object within a 2 week timeframe. 1/14/202: On-going.    Baseline 04/07/20: On-going-Pt states partly related to numbness and also being clumbsy.    Time 3    Period Weeks    Status On-going    Target Date 05/05/20      PT SHORT TERM GOAL #3   Title Pt will be able to sit for at least 20 minutes without having to readjust due to significant discomfort/pain. 04/07/20: On-going- tp reports she is not having to readjust sitting or her neck as frequently with improved neck pain.    Baseline 04/07/20: On-going- tp reports she is not having to readjust sitting or her neck as frequently with improved neck pain.    Time 3    Period Weeks    Status On-going      PT SHORT TERM GOAL #4   Title Patient will demonstrate upright posture in sitting rather than slouched posture throughout treatment session with minimal to no cues.    Baseline Slouched sitting posture with cues/education regarding postural correction    Time 3    Period Weeks    Status On-going    Target Date 04/07/20             PT Long Term Goals - 04/25/20 1040      PT LONG TERM GOAL #1   Title Pt will be independent with advanced HEP.    Baseline Pt  provided with initial HEP during evaluation 01/20/2020.    Period Weeks    Status On-going      PT LONG TERM GOAL #2   Title Pt will demonstrate 5/5 BUE MMT grossly.    Time 6    Period Weeks    Status Achieved      PT LONG TERM GOAL #3   Title Patient will be able to report no incidents of dropping items for at least 5 weeks.    Baseline Pt randomly drops items occasionally with the last time being this past weekend    Time 6    Period Weeks    Status On-going                 Plan - 04/25/20 1040    Clinical Impression Statement Pt reports less compliance with HEP because she now has a massage gun and her son helps her with it. She does perform the neck stretches frequently. Self care provided with education on use of thereacane verses tennisball, no tennis balls available in clinic however patient is successful with relieving tension with theragun. She continues to drop items with last episode over the weekend. Her neck rotation AROM has improved. She continues to require cues for upright posture during treatment.    PT Next Visit Plan Review and update HEP PRN,  (consider BLE strength - ask pt if she still has N/T in BLE), sensation assessment, manual therapy (STM/myofascial release, cervical and thoracic joint mobilizations, manual cervical traction), postural exercises/periscapular strengthening    PT Home Exercise Plan [id number] - upper trap and levator scap stretches, scapular retraction, chin tucks (cervical retraction), ulnar nerve glides (trial to see if any improvement of UE symptoms occurs), added open books, thoracic chair extension, doorway stretch, red band horizontal and ER           Patient will benefit from skilled therapeutic intervention in order to improve the following deficits and impairments:  Postural dysfunction,Pain,Decreased strength,Impaired UE functional use,Increased muscle spasms,Obesity,Improper body mechanics  Visit Diagnosis:  Abnormal  posture  Cervicalgia  Pain in thoracic spine  Muscle weakness (generalized)  Other muscle spasm     Problem List Patient Active Problem List   Diagnosis Date Noted  . Symptomatic mammary hypertrophy 12/07/2019  . Back pain 12/07/2019  . Neck pain 12/07/2019  . Morbid obesity ([place]) 11/09/2019  . Prediabetes 11/04/2019  . Elevated cholesterol 11/04/2019  . Vitamin D deficiency 11/04/2019  . Hemorrhagic ovarian cyst 06/21/2017  . 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 Cellulitis 01/06/2013  . History of cesarean section, classical 12/18/2012  . Allergy 10/29/2012    Dorene Ar, PTA 04/25/2020, 11:04 AM  [facility] [address] [place], [place], [address] Phone: [phone]   Fax:  [phone]  Name: CORDIE BEAZLEY MRN: [id number] Date of Birth: 1990/05/02   PHYSICAL THERAPY DISCHARGE SUMMARY  Visits from Start of Care: 5  Current functional level related to goals / functional outcomes: See above Remaining deficits: See above   Education / Equipment: HEP  Plan: Patient agrees to discharge.  Patient goals were partially met. Patient is being discharged due to not returning since the last visit.  ?????       Allen Ralls MS, PT 05/24/20 11:58 AM

## 2020-05-01 ENCOUNTER — Ambulatory Visit (INDEPENDENT_AMBULATORY_CARE_PROVIDER_SITE_OTHER): Payer: Medicaid Other | Admitting: Family Medicine

## 2020-05-01 ENCOUNTER — Other Ambulatory Visit (INDEPENDENT_AMBULATORY_CARE_PROVIDER_SITE_OTHER): Payer: Self-pay | Admitting: Family Medicine

## 2020-05-01 DIAGNOSIS — E559 Vitamin D deficiency, unspecified: Secondary | ICD-10-CM

## 2020-05-01 NOTE — Telephone Encounter (Signed)
Message sent to pt-CAS 

## 2020-05-02 ENCOUNTER — Telehealth: Payer: Self-pay | Admitting: Physical Therapy

## 2020-05-02 ENCOUNTER — Ambulatory Visit: Payer: Medicaid Other | Admitting: Physical Therapy

## 2020-05-02 NOTE — Telephone Encounter (Signed)
Attempted to contact patient regarding no show to appointment. Left voicemail asking her to call to schedule an appointment if she would like to continue.

## 2020-05-04 ENCOUNTER — Ambulatory Visit (INDEPENDENT_AMBULATORY_CARE_PROVIDER_SITE_OTHER): Payer: Medicaid Other | Admitting: Family Medicine

## 2020-05-08 ENCOUNTER — Ambulatory Visit (INDEPENDENT_AMBULATORY_CARE_PROVIDER_SITE_OTHER): Payer: Medicaid Other | Admitting: Family Medicine

## 2020-05-10 ENCOUNTER — Ambulatory Visit (INDEPENDENT_AMBULATORY_CARE_PROVIDER_SITE_OTHER): Payer: Medicaid Other | Admitting: Family Medicine

## 2020-05-10 ENCOUNTER — Other Ambulatory Visit: Payer: Self-pay

## 2020-05-10 ENCOUNTER — Encounter (INDEPENDENT_AMBULATORY_CARE_PROVIDER_SITE_OTHER): Payer: Self-pay | Admitting: Family Medicine

## 2020-05-10 VITALS — BP 145/85 | HR 83 | Temp 98.4°F | Ht 64.0 in | Wt 290.0 lb

## 2020-05-10 DIAGNOSIS — F32A Depression, unspecified: Secondary | ICD-10-CM

## 2020-05-10 DIAGNOSIS — E559 Vitamin D deficiency, unspecified: Secondary | ICD-10-CM

## 2020-05-10 DIAGNOSIS — F419 Anxiety disorder, unspecified: Secondary | ICD-10-CM

## 2020-05-10 DIAGNOSIS — Z6841 Body Mass Index (BMI) 40.0 and over, adult: Secondary | ICD-10-CM

## 2020-05-15 NOTE — Progress Notes (Signed)
Chief Complaint:   OBESITY Misty Walsh is here to discuss her progress with her obesity treatment plan along with follow-up of her obesity related diagnoses. Misty Walsh is on the Category 3 Plan and states she is following her eating plan approximately 45% of the time. Misty Walsh states she is swimming 20 minutes 5 times per week.  Today's visit was #: 6 Starting weight: 288 lbs Starting date: 11/03/2019 Today's weight: 290 lbs Today's date: 05/10/2020 Total lbs lost to date: 0 Total lbs lost since last in-office visit: 1 lb  Interim History: Misty Walsh went on vacation and had a bunch of better days once she started medication. Food wise, she has done quite a bit of grapes, pineapples, and carrots and ranch. She is still struggling to get protein in and feels occasionally like this is too heavy.  Subjective:   1. Vitamin D deficiency Misty Walsh denies nausea, vomiting, and muscle weakness but notes fatigue. Pt is on prescription Vit D.  2. Anxiety and depression Misty Walsh is taking Buspar every other day and Zoloft daily. Her symptoms are improved.  Assessment/Plan:   1. Vitamin D deficiency Low Vitamin D level contributes to fatigue and are associated with obesity, breast, and colon cancer. She agrees to continue to take prescription Vitamin D @50 ,000 IU every week and will follow-up for routine testing of Vitamin D, at least 2-3 times per year to avoid over-replacement.  2. Anxiety and depression Behavior modification techniques were discussed today to help Misty Walsh deal with her anxiety.  Orders and follow up as documented in patient record. Behavior modification techniques were discussed today to help Misty Walsh deal with her emotional/non-hunger eating behaviors.  Orders and follow up as documented in patient record. Continue current treatment plan.  3. Class 3 severe obesity with serious comorbidity and body mass index (BMI) of 45.0 to 49.9 in adult, unspecified obesity type (HCC) Misty Walsh is currently  in the action stage of change. As such, her goal is to continue with weight loss efforts. She has agreed to the Category 3 Plan and the Pescatarian Plan + 300.   Exercise goals: No exercise has been prescribed at this time.  Behavioral modification strategies: increasing lean protein intake, meal planning and cooking strategies and keeping healthy foods in the home.  Misty Walsh has agreed to follow-up with our clinic in 2-3 weeks. She was informed of the importance of frequent follow-up visits to maximize her success with intensive lifestyle modifications for her multiple health conditions.   Objective:   Blood pressure (!) 145/85, pulse 83, temperature 98.4 F (36.9 C), temperature source Oral, height 5\' 4"  (1.626 m), weight 290 lb (131.5 kg), last menstrual period 05/09/2020, SpO2 97 %. Body mass index is 49.78 kg/m.  General: Cooperative, alert, well developed, in no acute distress. HEENT: Conjunctivae and lids unremarkable. Cardiovascular: Regular rhythm.  Lungs: Normal work of breathing. Neurologic: No focal deficits.   Lab Results  Component Value Date   CREATININE 0.74 11/03/2019   BUN 7 11/03/2019   NA 140 11/03/2019   K 4.1 11/03/2019   CL 106 11/03/2019   CO2 21 11/03/2019   Lab Results  Component Value Date   ALT 20 11/03/2019   AST 19 11/03/2019   ALKPHOS 84 11/03/2019   BILITOT 0.3 11/03/2019   Lab Results  Component Value Date   HGBA1C 6.3 (H) 11/03/2019   HGBA1C 5.1 09/16/2012   Lab Results  Component Value Date   INSULIN 29.5 (H) 11/03/2019   Lab Results  Component Value  Date   TSH 3.250 09/14/2019   Lab Results  Component Value Date   CHOL 244 (H) 11/03/2019   HDL 47 11/03/2019   LDLCALC 167 (H) 11/03/2019   TRIG 165 (H) 11/03/2019   Lab Results  Component Value Date   WBC 7.4 09/14/2019   HGB 13.8 09/14/2019   HCT 39.9 09/14/2019   MCV 92 09/14/2019   PLT 238 09/14/2019     Attestation Statements:   Reviewed by clinician on day of  visit: allergies, medications, problem list, medical history, surgical history, family history, social history, and previous encounter notes.  Time spent on visit including pre-visit chart review and post-visit care and charting was 15 minutes.   Edmund Hilda, am acting as transcriptionist for Reuben Likes, MD.   I have reviewed the above documentation for accuracy and completeness, and I agree with the above. - Katherina Mires, MD

## 2020-05-25 ENCOUNTER — Other Ambulatory Visit: Payer: Self-pay

## 2020-05-25 ENCOUNTER — Encounter (INDEPENDENT_AMBULATORY_CARE_PROVIDER_SITE_OTHER): Payer: Self-pay | Admitting: Family Medicine

## 2020-05-25 ENCOUNTER — Ambulatory Visit (INDEPENDENT_AMBULATORY_CARE_PROVIDER_SITE_OTHER): Payer: Medicaid Other | Admitting: Family Medicine

## 2020-05-25 VITALS — BP 121/85 | HR 83 | Temp 98.0°F | Ht 64.0 in | Wt 289.0 lb

## 2020-05-25 DIAGNOSIS — E559 Vitamin D deficiency, unspecified: Secondary | ICD-10-CM

## 2020-05-25 DIAGNOSIS — I10 Essential (primary) hypertension: Secondary | ICD-10-CM

## 2020-05-25 DIAGNOSIS — Z6841 Body Mass Index (BMI) 40.0 and over, adult: Secondary | ICD-10-CM

## 2020-05-25 DIAGNOSIS — R7303 Prediabetes: Secondary | ICD-10-CM

## 2020-05-25 DIAGNOSIS — E66813 Obesity, class 3: Secondary | ICD-10-CM

## 2020-05-25 DIAGNOSIS — E7800 Pure hypercholesterolemia, unspecified: Secondary | ICD-10-CM

## 2020-05-26 LAB — VITAMIN D 25 HYDROXY (VIT D DEFICIENCY, FRACTURES): Vit D, 25-Hydroxy: 17 ng/mL — ABNORMAL LOW (ref 30.0–100.0)

## 2020-05-26 LAB — COMPREHENSIVE METABOLIC PANEL
ALT: 27 IU/L (ref 0–32)
AST: 20 IU/L (ref 0–40)
Albumin/Globulin Ratio: 2 (ref 1.2–2.2)
Albumin: 4.8 g/dL (ref 3.9–5.0)
Alkaline Phosphatase: 80 IU/L (ref 44–121)
BUN/Creatinine Ratio: 12 (ref 9–23)
BUN: 11 mg/dL (ref 6–20)
Bilirubin Total: 0.4 mg/dL (ref 0.0–1.2)
CO2: 23 mmol/L (ref 20–29)
Calcium: 9.8 mg/dL (ref 8.7–10.2)
Chloride: 100 mmol/L (ref 96–106)
Creatinine, Ser: 0.91 mg/dL (ref 0.57–1.00)
Globulin, Total: 2.4 g/dL (ref 1.5–4.5)
Glucose: 79 mg/dL (ref 65–99)
Potassium: 4.1 mmol/L (ref 3.5–5.2)
Sodium: 138 mmol/L (ref 134–144)
Total Protein: 7.2 g/dL (ref 6.0–8.5)
eGFR: 87 mL/min/{1.73_m2} (ref >59)

## 2020-05-26 LAB — LIPID PANEL WITH LDL/HDL RATIO
Cholesterol, Total: 252 mg/dL — ABNORMAL HIGH (ref 100–199)
HDL: 58 mg/dL (ref >39)
LDL Chol Calc (NIH): 175 mg/dL — ABNORMAL HIGH (ref 0–99)
LDL/HDL Ratio: 3 ratio (ref 0.0–3.2)
Triglycerides: 109 mg/dL (ref 0–149)
VLDL Cholesterol Cal: 19 mg/dL (ref 5–40)

## 2020-05-26 LAB — HEMOGLOBIN A1C
Est. average glucose Bld gHb Est-mCnc: 114 mg/dL
Hgb A1c MFr Bld: 5.6 % (ref 4.8–5.6)

## 2020-05-26 LAB — INSULIN, RANDOM: INSULIN: 41 u[IU]/mL — ABNORMAL HIGH (ref 2.6–24.9)

## 2020-05-30 NOTE — Progress Notes (Signed)
Chief Complaint:   OBESITY Misty Walsh is here to discuss her progress with her obesity treatment plan along with follow-up of her obesity related diagnoses. Misty Walsh is on the Category 3 Plan and states she is following her eating plan approximately 100% of the time. Misty Walsh states she is treadmill and weight training 50 minutes 4 times per week.  Today's visit was #: 7 Starting weight: 288 lbs Starting date: 11/03/2019 Today's weight: 289 lbs Today's date: 05/25/2020 Total lbs lost to date: 0 Total lbs lost since last in-office visit: 1  Interim History: Misty Walsh has been working on walking on treadmill and has been walking upwards of 50 minutes. She has been doing salmon, fruit, protein shakes, and vegetables. Goal is to get up 100%. She is able to get more food in and has been less nauseous.  Subjective:   1. Essential hypertension Misty Walsh's BP is well controlled today. She denies chest pain, chest pressure and headache.  BP Readings from Last 3 Encounters:  05/25/20 121/85  05/10/20 (!) 145/85  04/18/20 (!) 150/97   2. Pre-diabetes Misty Walsh's last A1c 6.3 and insulin 29.5. She is on GLP-1. She reports GI side effects are improving.  3. Elevated cholesterol Misty Walsh's last LDL 167, HDL 47, and triglycerides 161. She is not on statin therapy.  4. Vitamin D deficiency Misty Walsh denies nausea, vomiting, and muscle weakness but notes fatigue. Her last Vit D level was 16.1.  Assessment/Plan:   1. Essential hypertension Misty Walsh is working on healthy weight loss and exercise to improve blood pressure control. We will watch for signs of hypotension as she continues her lifestyle modifications. Check labs today.  - Comprehensive metabolic panel  2. Pre-diabetes Misty Walsh will continue to work on weight loss, exercise, and decreasing simple carbohydrates to help decrease the risk of diabetes. Check labs today.  - Hemoglobin A1c - Insulin, random  3. Elevated cholesterol Cardiovascular risk  and specific lipid/LDL goals reviewed.  We discussed several lifestyle modifications today and Misty Walsh will continue to work on diet, exercise and weight loss efforts. Orders and follow up as documented in patient record. Check labs today.  Counseling Intensive lifestyle modifications are the first line treatment for this issue. . Dietary changes: Increase soluble fiber. Decrease simple carbohydrates. . Exercise changes: Moderate to vigorous-intensity aerobic activity 150 minutes per week if tolerated. . Lipid-lowering medications: see documented in medical record.  - Lipid Panel With LDL/HDL Ratio  4. Vitamin D deficiency Low Vitamin D level contributes to fatigue and are associated with obesity, breast, and colon cancer. She agrees to continue to take prescription Vitamin D @50 ,000 IU every week and will follow-up for routine testing of Vitamin D, at least 2-3 times per year to avoid over-replacement. Check labs today.  - VITAMIN D 25 Hydroxy (Vit-D Deficiency, Fractures)  5. Class 3 severe obesity with serious comorbidity and body mass index (BMI) of 50.0 to 59.9 in adult, unspecified obesity type (HCC) Misty Walsh is currently in the action stage of change. As such, her goal is to continue with weight loss efforts. She has agreed to the Category 4 Plan.   Exercise goals: As is  Behavioral modification strategies: increasing lean protein intake, meal planning and cooking strategies, keeping healthy foods in the home and planning for success.  Misty Walsh has agreed to follow-up with our clinic in 3 weeks. She was informed of the importance of frequent follow-up visits to maximize her success with intensive lifestyle modifications for her multiple health conditions.   Objective:  Blood pressure 121/85, pulse 83, temperature 98 F (36.7 C), height 5\' 4"  (1.626 m), weight 289 lb (131.1 kg), last menstrual period 05/09/2020, SpO2 96 %. Body mass index is 49.61 kg/m.  General: Cooperative, alert,  well developed, in no acute distress. HEENT: Conjunctivae and lids unremarkable. Cardiovascular: Regular rhythm.  Lungs: Normal work of breathing. Neurologic: No focal deficits.   Lab Results  Component Value Date   CREATININE 0.91 05/25/2020   BUN 11 05/25/2020   NA 138 05/25/2020   K 4.1 05/25/2020   CL 100 05/25/2020   CO2 23 05/25/2020   Lab Results  Component Value Date   ALT 27 05/25/2020   AST 20 05/25/2020   ALKPHOS 80 05/25/2020   BILITOT 0.4 05/25/2020   Lab Results  Component Value Date   HGBA1C 5.6 05/25/2020   HGBA1C 6.3 (H) 11/03/2019   HGBA1C 5.1 09/16/2012   Lab Results  Component Value Date   INSULIN 41.0 (H) 05/25/2020   INSULIN 29.5 (H) 11/03/2019   Lab Results  Component Value Date   TSH 3.250 09/14/2019   Lab Results  Component Value Date   CHOL 252 (H) 05/25/2020   HDL 58 05/25/2020   LDLCALC 175 (H) 05/25/2020   TRIG 109 05/25/2020   Lab Results  Component Value Date   WBC 7.4 09/14/2019   HGB 13.8 09/14/2019   HCT 39.9 09/14/2019   MCV 92 09/14/2019   PLT 238 09/14/2019   No results found for: IRON, TIBC, FERRITIN  Obesity Behavioral Intervention:   Approximately 15 minutes were spent on the discussion below.  ASK: We discussed the diagnosis of obesity with 09/16/2019 today and Kerilyn agreed to give Misty Walsh permission to discuss obesity behavioral modification therapy today.  ASSESS: Misty Walsh has the diagnosis of obesity and her BMI today is 49.7. Misty Walsh is in the action stage of change.   ADVISE: Misty Walsh was educated on the multiple health risks of obesity as well as the benefit of weight loss to improve her health. She was advised of the need for long term treatment and the importance of lifestyle modifications to improve her current health and to decrease her risk of future health problems.  AGREE: Multiple dietary modification options and treatment options were discussed and Misty Walsh agreed to follow the recommendations documented in  the above note.  ARRANGE: Misty Walsh was educated on the importance of frequent visits to treat obesity as outlined per CMS and USPSTF guidelines and agreed to schedule her next follow up appointment today.  Attestation Statements:   Reviewed by clinician on day of visit: allergies, medications, problem list, medical history, surgical history, family history, social history, and previous encounter notes.  Misty Walsh, am acting as transcriptionist for Edmund Hilda, MD.   I have reviewed the above documentation for accuracy and completeness, and I agree with the above. - Reuben Likes, MD

## 2020-06-02 ENCOUNTER — Encounter (INDEPENDENT_AMBULATORY_CARE_PROVIDER_SITE_OTHER): Payer: Self-pay | Admitting: Family Medicine

## 2020-06-02 ENCOUNTER — Other Ambulatory Visit (INDEPENDENT_AMBULATORY_CARE_PROVIDER_SITE_OTHER): Payer: Self-pay | Admitting: Family Medicine

## 2020-06-02 DIAGNOSIS — F419 Anxiety disorder, unspecified: Secondary | ICD-10-CM

## 2020-06-02 DIAGNOSIS — F32A Depression, unspecified: Secondary | ICD-10-CM

## 2020-06-05 ENCOUNTER — Other Ambulatory Visit (INDEPENDENT_AMBULATORY_CARE_PROVIDER_SITE_OTHER): Payer: Self-pay

## 2020-06-05 DIAGNOSIS — F32A Depression, unspecified: Secondary | ICD-10-CM

## 2020-06-05 DIAGNOSIS — F419 Anxiety disorder, unspecified: Secondary | ICD-10-CM

## 2020-06-05 DIAGNOSIS — R7303 Prediabetes: Secondary | ICD-10-CM

## 2020-06-05 MED ORDER — SERTRALINE HCL 50 MG PO TABS: 25.0000 mg | ORAL_TABLET | Freq: Every day | ORAL | 0 refills | Status: DC

## 2020-06-05 MED ORDER — VICTOZA 18 MG/3ML ~~LOC~~ SOPN: 1.2000 mg | PEN_INJECTOR | Freq: Every day | SUBCUTANEOUS | 0 refills | Status: DC

## 2020-06-05 NOTE — Telephone Encounter (Signed)
Last seen by Dr Kandis Nab

## 2020-06-15 ENCOUNTER — Other Ambulatory Visit: Payer: Self-pay

## 2020-06-15 ENCOUNTER — Ambulatory Visit (INDEPENDENT_AMBULATORY_CARE_PROVIDER_SITE_OTHER): Payer: Medicaid Other | Admitting: Family Medicine

## 2020-06-15 VITALS — BP 116/75 | HR 86 | Temp 97.8°F | Ht 64.0 in | Wt 290.0 lb

## 2020-06-15 DIAGNOSIS — Z6841 Body Mass Index (BMI) 40.0 and over, adult: Secondary | ICD-10-CM | POA: Diagnosis not present

## 2020-06-15 DIAGNOSIS — I10 Essential (primary) hypertension: Secondary | ICD-10-CM | POA: Diagnosis not present

## 2020-06-15 DIAGNOSIS — E559 Vitamin D deficiency, unspecified: Secondary | ICD-10-CM

## 2020-06-15 MED ORDER — CHOLECALCIFEROL 1.25 MG (50000 UT) PO TABS: 50000.0000 [IU] | ORAL_TABLET | ORAL | 0 refills | Status: DC

## 2020-06-19 NOTE — Progress Notes (Signed)
Chief Complaint:   OBESITY Misty Walsh is here to discuss her progress with her obesity treatment plan along with follow-up of her obesity related diagnoses. [name] is on the Category 4 Plan and the Pescatarian Plan and states she is following her eating plan approximately 90% of the time. [name] states she is walking 50 minutes 3 times per week.  Today's visit was #: 8 Starting weight: 288 lbs Starting date: 11/03/2019 Today's weight: 290 lbs Today's date: 06/15/2020 Total lbs lost to date: 0 Total lbs lost since last in-office visit: 0  Interim History: Misty Walsh feels meal plan is difficult to follow in terms of quantity of food. She is trying to stop eating at 6:30 PM to decrease indulgent eating. Pt is getting tired of eating and feels frustrated with lack of weight loss. She is wondering about a liquid diet.  Subjective:   1. Essential hypertension Misty Walsh's BP is well controled. Pt denies chest pain, chest pressure and headache. She is on Zestoretic. Pt reports taking pill every other day.  2. Vitamin D deficiency Misty Walsh denies nausea, vomiting, and muscle weakness but notes fatigue. Pt is on prescription Vit D.  Assessment/Plan:   1. Essential hypertension [name] is working on healthy weight loss and exercise to improve blood pressure control. We will watch for signs of hypotension as she continues her lifestyle modifications. If BP is similar at next appointment, decrease BP meds.  2. Vitamin D deficiency Low Vitamin D level contributes to fatigue and are associated with obesity, breast, and colon cancer. She agrees to continue to take prescription Vitamin D @50 ,000 IU every week and will follow-up for routine testing of Vitamin D, at least 2-3 times per year to avoid over-replacement.  - Cholecalciferol 1.25 MG (50000 UT) TABS; Take 50,000 Int'l Units by mouth once a week.  Dispense: 4 tablet; Refill: 0  3. Class 3 severe obesity with serious comorbidity and body mass index  (BMI) of 50.0 to 59.9 in adult, unspecified obesity type (HCC)  Misty Walsh is currently in the action stage of change. As such, her goal is to continue with weight loss efforts. She has agreed to keeping a food journal and adhering to recommended goals of 1450-1550 calories and 100+ g protein.   Exercise goals: All adults should avoid inactivity. Some physical activity is better than none, and adults who participate in any amount of physical activity gain some health benefits.  Behavioral modification strategies: increasing lean protein intake, no skipping meals, meal planning and cooking strategies, keeping healthy foods in the home and keeping a strict food journal.  Misty Walsh has agreed to follow-up with our clinic in 3 weeks. She was informed of the importance of frequent follow-up visits to maximize her success with intensive lifestyle modifications for her multiple health conditions.   Objective:   Blood pressure 116/75, pulse 86, temperature 97.8 F (36.6 C), height 5\' 4"  (1.626 m), weight 290 lb (131.5 kg), SpO2 97 %. Body mass index is 49.78 kg/m.  General: Cooperative, alert, well developed, in no acute distress. HEENT: Conjunctivae and lids unremarkable. Cardiovascular: Regular rhythm.  Lungs: Normal work of breathing. Neurologic: No focal deficits.   Lab Results  Component Value Date   CREATININE 0.91 05/25/2020   BUN 11 05/25/2020   NA 138 05/25/2020   K 4.1 05/25/2020   CL 100 05/25/2020   CO2 23 05/25/2020   Lab Results  Component Value Date   ALT 27 05/25/2020   AST 20 05/25/2020   ALKPHOS 80  05/25/2020   BILITOT 0.4 05/25/2020   Lab Results  Component Value Date   HGBA1C 5.6 05/25/2020   HGBA1C 6.3 (H) 11/03/2019   HGBA1C 5.1 09/16/2012   Lab Results  Component Value Date   INSULIN 41.0 (H) 05/25/2020   INSULIN 29.5 (H) 11/03/2019   Lab Results  Component Value Date   TSH 3.250 09/14/2019   Lab Results  Component Value Date   CHOL 252 (H) 05/25/2020    HDL 58 05/25/2020   LDLCALC 175 (H) 05/25/2020   TRIG 109 05/25/2020   Lab Results  Component Value Date   WBC 7.4 09/14/2019   HGB 13.8 09/14/2019   HCT 39.9 09/14/2019   MCV 92 09/14/2019   PLT 238 09/14/2019   No results found for: IRON, TIBC, FERRITIN   Attestation Statements:   Reviewed by clinician on day of visit: allergies, medications, problem list, medical history, surgical history, family history, social history, and previous encounter notes.  Time spent on visit including pre-visit chart review and post-visit care and charting was 20 minutes.   Edmund Hilda, am acting as transcriptionist for Misty Likes, MD.  I have reviewed the above documentation for accuracy and completeness, and I agree with the above. - Katherina Mires, MD

## 2020-07-10 ENCOUNTER — Encounter (INDEPENDENT_AMBULATORY_CARE_PROVIDER_SITE_OTHER): Payer: Self-pay

## 2020-07-10 ENCOUNTER — Ambulatory Visit (INDEPENDENT_AMBULATORY_CARE_PROVIDER_SITE_OTHER): Payer: Medicaid Other | Admitting: Family Medicine

## 2020-07-11 ENCOUNTER — Other Ambulatory Visit (INDEPENDENT_AMBULATORY_CARE_PROVIDER_SITE_OTHER): Payer: Self-pay | Admitting: Family Medicine

## 2020-07-11 DIAGNOSIS — R7303 Prediabetes: Secondary | ICD-10-CM

## 2020-07-11 NOTE — Telephone Encounter (Signed)
DR Ukleja 

## 2020-07-26 ENCOUNTER — Other Ambulatory Visit: Payer: Self-pay | Admitting: Neurology

## 2020-07-26 NOTE — Telephone Encounter (Signed)
Patient has not had her sleep study done. Last seen 01/2020. Pt needs to follow-up for further refills.

## 2020-08-07 ENCOUNTER — Encounter (INDEPENDENT_AMBULATORY_CARE_PROVIDER_SITE_OTHER): Payer: Self-pay

## 2020-08-07 ENCOUNTER — Ambulatory Visit (INDEPENDENT_AMBULATORY_CARE_PROVIDER_SITE_OTHER): Payer: Medicaid Other | Admitting: Family Medicine

## 2020-08-09 ENCOUNTER — Other Ambulatory Visit (INDEPENDENT_AMBULATORY_CARE_PROVIDER_SITE_OTHER): Payer: Self-pay | Admitting: Family Medicine

## 2020-08-09 DIAGNOSIS — F419 Anxiety disorder, unspecified: Secondary | ICD-10-CM

## 2020-08-12 ENCOUNTER — Other Ambulatory Visit (INDEPENDENT_AMBULATORY_CARE_PROVIDER_SITE_OTHER): Payer: Self-pay | Admitting: Family Medicine

## 2020-08-14 ENCOUNTER — Other Ambulatory Visit (INDEPENDENT_AMBULATORY_CARE_PROVIDER_SITE_OTHER): Payer: Self-pay | Admitting: Family Medicine

## 2020-08-14 DIAGNOSIS — R7303 Prediabetes: Secondary | ICD-10-CM

## 2021-01-15 ENCOUNTER — Encounter (HOSPITAL_COMMUNITY): Payer: Self-pay | Admitting: Emergency Medicine

## 2021-01-15 ENCOUNTER — Emergency Department (HOSPITAL_COMMUNITY)
Admission: EM | Admit: 2021-01-15 | Discharge: 2021-01-16 | Disposition: A | Discharge status: Home / Self Care | Payer: Medicaid Other | Attending: Emergency Medicine | Admitting: Emergency Medicine

## 2021-01-15 ENCOUNTER — Other Ambulatory Visit: Payer: Self-pay

## 2021-01-15 DIAGNOSIS — R109 Unspecified abdominal pain: Secondary | ICD-10-CM | POA: Diagnosis present

## 2021-01-15 DIAGNOSIS — Z79899 Other long term (current) drug therapy: Secondary | ICD-10-CM | POA: Insufficient documentation

## 2021-01-15 DIAGNOSIS — K219 Gastro-esophageal reflux disease without esophagitis: Secondary | ICD-10-CM | POA: Insufficient documentation

## 2021-01-15 DIAGNOSIS — Z87891 Personal history of nicotine dependence: Secondary | ICD-10-CM | POA: Diagnosis not present

## 2021-01-15 DIAGNOSIS — N12 Tubulo-interstitial nephritis, not specified as acute or chronic: Secondary | ICD-10-CM | POA: Insufficient documentation

## 2021-01-15 DIAGNOSIS — N63 Unspecified lump in unspecified breast: Secondary | ICD-10-CM | POA: Diagnosis not present

## 2021-01-15 DIAGNOSIS — I10 Essential (primary) hypertension: Secondary | ICD-10-CM | POA: Diagnosis not present

## 2021-01-15 DIAGNOSIS — N9489 Other specified conditions associated with female genital organs and menstrual cycle: Secondary | ICD-10-CM | POA: Diagnosis not present

## 2021-01-15 DIAGNOSIS — Z794 Long term (current) use of insulin: Secondary | ICD-10-CM | POA: Diagnosis not present

## 2021-01-15 LAB — BASIC METABOLIC PANEL
Anion gap: 7 (ref 5–15)
BUN: 9 mg/dL (ref 6–20)
CO2: 22 mmol/L (ref 22–32)
Calcium: 9.2 mg/dL (ref 8.9–10.3)
Chloride: 105 mmol/L (ref 98–111)
Creatinine, Ser: 0.91 mg/dL (ref 0.44–1.00)
GFR, Estimated: >60 mL/min (ref >60)
Glucose, Bld: 106 mg/dL — ABNORMAL HIGH (ref 70–99)
Potassium: 3.4 mmol/L — ABNORMAL LOW (ref 3.5–5.1)
Sodium: 134 mmol/L — ABNORMAL LOW (ref 135–145)

## 2021-01-15 NOTE — ED Triage Notes (Signed)
Patient complaining of right flank pain that started 3 am today. Patient states she is having dysuria. Patient has GECMS placed Piv# 18 in left ac. of fentanyl and 4 mg of zofran.

## 2021-01-16 ENCOUNTER — Emergency Department (HOSPITAL_COMMUNITY): Payer: Medicaid Other

## 2021-01-16 LAB — CBC
HCT: 40.7 % (ref 36.0–46.0)
Hemoglobin: 13.4 g/dL (ref 12.0–15.0)
MCH: 31.5 pg (ref 26.0–34.0)
MCHC: 32.9 g/dL (ref 30.0–36.0)
MCV: 95.5 fL (ref 80.0–100.0)
Platelets: 241 10*3/uL (ref 150–400)
RBC: 4.26 MIL/uL (ref 3.87–5.11)
RDW: 12.3 % (ref 11.5–15.5)
WBC: 18.6 10*3/uL — ABNORMAL HIGH (ref 4.0–10.5)
nRBC: 0 % (ref 0.0–0.2)

## 2021-01-16 LAB — URINALYSIS, ROUTINE W REFLEX MICROSCOPIC
Bilirubin Urine: NEGATIVE
Glucose, UA: NEGATIVE mg/dL
Hgb urine dipstick: NEGATIVE
Ketones, ur: NEGATIVE mg/dL
Nitrite: NEGATIVE
Protein, ur: 30 mg/dL — AB
Specific Gravity, Urine: 1.042 — ABNORMAL HIGH (ref 1.005–1.030)
pH: 5 (ref 5.0–8.0)

## 2021-01-16 LAB — HCG, QUANTITATIVE, PREGNANCY: hCG, Beta Chain, Quant, S: <1 m[IU]/mL (ref <5)

## 2021-01-16 MED ORDER — LISINOPRIL 10 MG PO TABS
10.0000 mg | ORAL_TABLET | Freq: Every day | ORAL | Status: DC
  Administered 2021-01-16: 10 mg via ORAL

## 2021-01-16 MED ORDER — SODIUM CHLORIDE 0.9 % IV SOLN: 1.0000 g | Freq: Once | INTRAVENOUS | Status: DC

## 2021-01-16 MED ORDER — KETOROLAC TROMETHAMINE 30 MG/ML IJ SOLN
30.0000 mg | Freq: Once | INTRAMUSCULAR | Status: AC
  Administered 2021-01-16: 30 mg via INTRAVENOUS

## 2021-01-16 MED ORDER — CEPHALEXIN 500 MG PO CAPS: 500.0000 mg | ORAL_CAPSULE | Freq: Three times a day (TID) | ORAL | 0 refills | Status: AC

## 2021-01-16 MED ORDER — SODIUM CHLORIDE 0.9 % IV SOLN
2.0000 g | Freq: Once | INTRAVENOUS | Status: AC
  Administered 2021-01-16: 2 g via INTRAVENOUS

## 2021-01-16 MED ORDER — ACETAMINOPHEN 325 MG PO TABS
650.0000 mg | ORAL_TABLET | Freq: Once | ORAL | Status: AC
  Administered 2021-01-16: 650 mg via ORAL

## 2021-01-16 MED ORDER — ONDANSETRON HCL 4 MG/2ML IJ SOLN
4.0000 mg | Freq: Once | INTRAMUSCULAR | Status: AC
  Administered 2021-01-16: 4 mg via INTRAVENOUS

## 2021-01-16 MED ORDER — HYDROCHLOROTHIAZIDE 12.5 MG PO TABS
12.5000 mg | ORAL_TABLET | Freq: Every day | ORAL | Status: DC
  Administered 2021-01-16: 12.5 mg via ORAL

## 2021-01-16 MED ORDER — LISINOPRIL-HYDROCHLOROTHIAZIDE 10-12.5 MG PO TABS: 1.0000 | ORAL_TABLET | Freq: Every day | ORAL | Status: DC

## 2021-01-16 MED ORDER — ONDANSETRON HCL 4 MG PO TABS: 4.0000 mg | ORAL_TABLET | Freq: Three times a day (TID) | ORAL | 0 refills | Status: DC | PRN

## 2021-01-16 MED ORDER — LISINOPRIL-HYDROCHLOROTHIAZIDE 10-12.5 MG PO TABS: ORAL_TABLET | ORAL | 0 refills | Status: DC

## 2021-01-16 MED ORDER — LACTATED RINGERS IV BOLUS
1000.0000 mL | Freq: Once | INTRAVENOUS | Status: AC
  Administered 2021-01-16: 1000 mL via INTRAVENOUS

## 2021-01-16 NOTE — Discharge Instructions (Addendum)
Take Tylenol or ibuprofen to help with the aches and pains.  Take the antibiotics as prescribed.  Make sure to start taking your blood pressure medication and follow-up with a primary care doctor to have your blood pressure rechecked

## 2021-01-16 NOTE — ED Provider Notes (Signed)
[facility]-EMERGENCY DEPT Provider Note   CSN: [id number] Arrival date & time: 01/15/21  2310     History Chief Complaint  Patient presents with   Flank Pain    Misty Walsh is a 30 y.o. female.   Flank Pain   Patient presents to the ED for evaluation of flank pain.  Patient states she has noticed urinary urgency but has only been going small amounts.  She started having flank pain that was more severe earlier this morning at about 3 AM on the right side.  Initially the pain was more on the right but now it seems to be more all over.  She denies any nausea vomiting.  She denies any diarrhea.  No cough or sore throat.  Patient does have history of prior kidney infections.  Symptoms worse severe last night so she called EMS.  EMS gave her fentanyl as well as Zofran.  Past Medical History:  Diagnosis Date   Anxiety    Back pain    Chlamydia    Depression med made her navel itching and  made her sleepy so she quit taking them   Edema, lower extremity    Elevated cholesterol    GERD (gastroesophageal reflux disease)    History of cesarean section, classical 12/18/2012   2014    Human papilloma virus    Hypertension    Joint pain    Moderate dysplasia of cervix    Numbness    right arm to hand   Ovarian cyst    Prediabetes     Patient Active Problem List   Diagnosis Date Noted   Symptomatic mammary hypertrophy 12/07/2019   Back pain 12/07/2019   Neck pain 12/07/2019   Morbid obesity (HCC) 11/09/2019   Prediabetes 11/04/2019   Elevated cholesterol 11/04/2019   Vitamin D deficiency 11/04/2019   Hemorrhagic ovarian cyst 06/21/2017   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   Cellulitis 01/06/2013   History of cesarean section, classical 12/18/2012   Allergy 10/29/2012    Past Surgical History:  Procedure Laterality Date   BREAST BIOPSY Left 2012   benign fibroadeoma   BREAST SURGERY     CESAREAN SECTION N/A 12/13/2012    Procedure: PRIMARY CESAREAN SECTION;  Surgeon: Kathreen Cosier, MD;  Location: [facility];  Service: Obstetrics;  Laterality: N/A;   LEEP       OB History     Gravida  1   Para  1   Term      Preterm  1   AB      Living  1      SAB      IAB      Ectopic      Multiple      Live Births              Family History  Problem Relation Age of Onset   Diabetes Mother    Heart disease Mother    Hypertension Mother    Depression Mother    Stroke Mother    Arthritis Mother    Stroke Father    High blood pressure Father    High Cholesterol Father    Depression Father    Anxiety disorder Father    Obesity Father    Asthma Sister    Kidney disease Brother        genetic condition   Diabetes Maternal Aunt    Hypertension Maternal Aunt    Asthma Maternal  Aunt    Epilepsy Maternal Aunt    Hypertension Maternal Grandmother    Dementia Maternal Grandmother    Heart disease Maternal Grandmother    Diabetes Paternal Grandmother     Social History   Tobacco Use   Smoking status: Former    Packs/day: 0.25    Years: 5.00    Pack years: 1.25    Types: Cigarettes    Quit date: 12/20/2019    Years since quitting: 1.0   Smokeless tobacco: Never  Vaping Use   Vaping Use: Never used  Substance Use Topics   Alcohol use: Yes    Comment: occ   Drug use: No    Home Medications Prior to Admission medications   Medication Sig Start Date End Date Taking? Authorizing Provider  acetaminophen (TYLENOL) 325 MG tablet Take 650 mg by mouth every 6 (six) hours as needed for mild pain, fever or headache.   Yes [provider]  cephALEXin (KEFLEX) 500 MG capsule Take 1 capsule (500 mg total) by mouth 3 (three) times daily for 7 days. 01/16/21 01/23/21 Yes Linwood Dibbles, MD  ibuprofen (ADVIL) 200 MG tablet Take 600 mg by mouth every 6 (six) hours as needed for fever, headache or mild pain.   Yes [provider]  ondansetron (ZOFRAN) 4 MG tablet Take 1 tablet (4 mg  total) by mouth every 8 (eight) hours as needed for nausea or vomiting. 01/16/21  Yes Linwood Dibbles, MD  traZODone (DESYREL) 50 MG tablet Take 1 tablet (50 mg total) by mouth at bedtime. Must follow-up for further refills. Call [phone]. Patient not taking: Reported on 01/16/2021 07/26/20   Dohmeier, Porfirio Mylar, MD  Insulin Pen Needle 31G X 6 MM MISC Use daily with Victoza 02/22/20   Corinna Capra A, DO  lisinopril-hydrochlorothiazide (ZESTORETIC) 10-12.5 MG tablet TAKE 1 TABLET BY MOUTH ONCE DAILY FOR 30 DAYS 01/16/21   Linwood Dibbles, MD  sertraline (ZOLOFT) 50 MG tablet Take 0.5 tablets (25 mg total) by mouth daily. Patient not taking: Reported on 01/16/2021 06/05/20   Langston Reusing, MD    Allergies    Macrobid [nitrofurantoin macrocrystal]  Review of Systems   Review of Systems  Genitourinary:  Positive for flank pain.  All other systems reviewed and are negative.  Physical Exam Updated Vital Signs BP (!) 180/123   Pulse (!) 108   Temp (!) 100.4 F (38 C) (Oral)   Resp (!) 24   Ht 1.626 m ( )   Wt 136.1 kg   LMP 01/09/2021 Comment: negative HCG quantitative 01/15/21  SpO2 96%   BMI 51.49 kg/m   Physical Exam Vitals and nursing note reviewed.  Constitutional:      Appearance: She is well-developed.     Comments: Increased BMI  HENT:     Head: Normocephalic and atraumatic.     Right Ear: External ear normal.     Left Ear: External ear normal.  Eyes:     General: No scleral icterus.       Right eye: No discharge.        Left eye: No discharge.     Conjunctiva/sclera: Conjunctivae normal.  Neck:     Trachea: No tracheal deviation.  Cardiovascular:     Rate and Rhythm: Normal rate and regular rhythm.  Pulmonary:     Effort: Pulmonary effort is normal. No respiratory distress.     Breath sounds: Normal breath sounds. No stridor. No wheezing or rales.  Abdominal:     General: Bowel  sounds are normal. There is no distension.     Palpations: Abdomen is soft.      Tenderness: There is no abdominal tenderness. There is right CVA tenderness. There is no guarding or rebound.  Musculoskeletal:        General: No tenderness or deformity.     Cervical back: Neck supple.  Skin:    General: Skin is warm and dry.     Findings: No rash.  Neurological:     General: No focal deficit present.     Mental Status: She is alert.     Cranial Nerves: No cranial nerve deficit (no facial droop, extraocular movements intact, no slurred speech).     Sensory: No sensory deficit.     Motor: No abnormal muscle tone or seizure activity.     Coordination: Coordination normal.  Psychiatric:        Mood and Affect: Mood normal.    ED Results / Procedures / Treatments   Labs (all labs ordered are listed, but only abnormal results are displayed) Labs Reviewed  URINALYSIS, ROUTINE W REFLEX MICROSCOPIC - Abnormal; Notable for the following components:      Result Value   APPearance HAZY (*)    Specific Gravity, Urine 1.042 (*)    Protein, ur 30 (*)    Leukocytes,Ua LARGE (*)    Bacteria, UA FEW (*)    All other components within normal limits  BASIC METABOLIC PANEL - Abnormal; Notable for the following components:   Sodium 134 (*)    Potassium 3.4 (*)    Glucose, Bld 106 (*)    All other components within normal limits  CBC - Abnormal; Notable for the following components:   WBC 18.6 (*)    All other components within normal limits  HCG, QUANTITATIVE, PREGNANCY    EKG None  Radiology CT Renal Stone Study  Result Date: 01/16/2021 CLINICAL DATA:  Initial evaluation for acute right flank pain. EXAM: CT ABDOMEN AND PELVIS WITHOUT CONTRAST TECHNIQUE: Multidetector CT imaging of the abdomen and pelvis was performed following the standard protocol without IV contrast. COMPARISON:  None. FINDINGS: Lower chest: Visualized lung bases are clear. 1.8 cm rounded soft tissue lesion partially visualized within the inferior right breast (series 2, image 1). Hepatobiliary:  Diffuse hypoattenuation within the hepatic parenchyma consistent with steatosis. Liver demonstrates no other acute or significant finding. Gallbladder within normal limits. No biliary dilatation. Pancreas: Unremarkable. No pancreatic ductal dilatation or surrounding inflammatory changes. Spleen: Normal in size without focal abnormality. Adrenals/Urinary Tract: Adrenal glands within normal limits. Kidneys equal in size without nephrolithiasis or hydronephrosis. No radiopaque calculi seen along the course of either renal collecting system. No hydroureter. No visible focal renal mass on this noncontrast examination. Partially distended bladder within normal limits. No layering stones within the bladder lumen. Stomach/Bowel: Stomach within normal limits. No evidence for bowel obstruction. Appendix within normal limits. No acute inflammatory changes seen about the bowels. Vascular/Lymphatic: Intra-abdominal aorta of normal caliber. No adenopathy. Reproductive: Uterus within normal limits.  No adnexal mass. Other: Small to moderate volume free fluid within the pelvis, likely physiologic. No free air. Musculoskeletal: No acute osseous finding. No discrete or worrisome osseous lesions. IMPRESSION: 1. No CT evidence for nephrolithiasis or obstructive uropathy. 2. Small to moderate volume free fluid within the pelvis, likely physiologic. 3. 1.8 cm rounded soft tissue lesion within the inferior right breast, partially visualized. Further evaluation with dedicated breast imaging recommended. This could be performed on a nonemergent outpatient basis. 4. Hepatic  steatosis. Electronically Signed   By: Rise Mu M.D.   On: 01/16/2021 02:46    Procedures Procedures   Medications Ordered in ED Medications  lisinopril-hydrochlorothiazide (ZESTORETIC) 10-12.5 MG per tablet 1 tablet (has no administration in time range)  ketorolac (TORADOL) 30 MG/ML injection 30 mg (30 mg Intravenous Given 01/16/21 0859)   acetaminophen (TYLENOL) tablet 650 mg (650 mg Oral Given 01/16/21 0857)  lactated ringers bolus 1,000 mL (1,000 mLs Intravenous New Bag/Given 01/16/21 0904)  ondansetron (ZOFRAN) injection 4 mg (4 mg Intravenous Given 01/16/21 0859)  cefTRIAXone (ROCEPHIN) 2 g in sodium chloride 0.9 % 100 mL IVPB (2 g Intravenous New Bag/Given 01/16/21 0858)    ED Course  I have reviewed the triage vital signs and the nursing notes.  Pertinent labs & imaging results that were available during my care of the patient were reviewed by me and considered in my medical decision making (see chart for details).  Clinical Course as of 01/16/21 1023  Tue Jan 16, 2021  0850 CBC shows a leukocytosis.  Metabolic panel is unremarkable.  No signs of dehydration.  Urinalysis does suggest a urinary tract infection [JK]  0850 CT scan does not show ureterolithiasis or any other acute abnormality.  Incidental breast lesion noted.  Patient states she is aware this and is post to have routine screening [JK]    Clinical Course User Index [JK] Linwood Dibbles, MD   MDM Rules/Calculators/A&P                           Patient presented to the ED for evaluation of flank pain and dysuria.  He work-up notable for urinalysis suggestive of urinary tract infection.  Patient also had leukocytosis.  She did have a low-grade fever here in the ED.  Patient noted to be mildly tachycardic but no hypotension.  Doubt  evolving sepsis.  CT scan did not show any acute abnormality.  No signs of infected ureteral stone.  Presentation consistent with pyelonephritis.  Patient was started on IV antibiotics and and I emetics.  She is tolerating oral fluids.  Feel that outpatient management is reasonable.  Patient also noted to be hypertensive in the ED.  Patient has not been taking her blood pressure medications for several months.  She does not have any chest pain acute neurologic symptoms or findings to suggest hypertensive emergency.  Patient was given a  dose of her oral blood pressure medications.  Will give her prescription to have her start taking those medications again.  Stressed the importance of compliance with her blood pressure medication and close outpatient follow-up. Final Clinical Impression(s) / ED Diagnoses Final diagnoses:  Pyelonephritis  Mass of breast, unspecified laterality  Hypertension, unspecified type    Rx / DC Orders ED Discharge Orders          Ordered    lisinopril-hydrochlorothiazide (ZESTORETIC) 10-12.5 MG tablet        01/16/21 1021    cephALEXin (KEFLEX) 500 MG capsule  3 times daily        01/16/21 1021    ondansetron (ZOFRAN) 4 MG tablet  Every 8 hours PRN        01/16/21 1021             Linwood Dibbles, MD 01/16/21 1023

## 2021-01-18 ENCOUNTER — Encounter (HOSPITAL_BASED_OUTPATIENT_CLINIC_OR_DEPARTMENT_OTHER): Payer: Self-pay | Admitting: Obstetrics and Gynecology

## 2021-01-18 ENCOUNTER — Other Ambulatory Visit: Payer: Self-pay

## 2021-01-18 ENCOUNTER — Emergency Department (HOSPITAL_BASED_OUTPATIENT_CLINIC_OR_DEPARTMENT_OTHER)
Admission: EM | Admit: 2021-01-18 | Discharge: 2021-01-18 | Disposition: A | Discharge status: Home / Self Care | Payer: Medicaid Other | Attending: Emergency Medicine | Admitting: Emergency Medicine

## 2021-01-18 DIAGNOSIS — R112 Nausea with vomiting, unspecified: Secondary | ICD-10-CM | POA: Insufficient documentation

## 2021-01-18 DIAGNOSIS — Z20822 Contact with and (suspected) exposure to covid-19: Secondary | ICD-10-CM | POA: Insufficient documentation

## 2021-01-18 DIAGNOSIS — R109 Unspecified abdominal pain: Secondary | ICD-10-CM | POA: Diagnosis not present

## 2021-01-18 DIAGNOSIS — Z87891 Personal history of nicotine dependence: Secondary | ICD-10-CM | POA: Diagnosis not present

## 2021-01-18 DIAGNOSIS — R000 Tachycardia, unspecified: Secondary | ICD-10-CM | POA: Diagnosis not present

## 2021-01-18 DIAGNOSIS — I10 Essential (primary) hypertension: Secondary | ICD-10-CM | POA: Insufficient documentation

## 2021-01-18 DIAGNOSIS — Z794 Long term (current) use of insulin: Secondary | ICD-10-CM | POA: Insufficient documentation

## 2021-01-18 DIAGNOSIS — Z79899 Other long term (current) drug therapy: Secondary | ICD-10-CM | POA: Insufficient documentation

## 2021-01-18 LAB — URINALYSIS, ROUTINE W REFLEX MICROSCOPIC
Bilirubin Urine: NEGATIVE
Glucose, UA: NEGATIVE mg/dL
Hgb urine dipstick: NEGATIVE
Ketones, ur: 15 mg/dL — AB
Nitrite: NEGATIVE
Protein, ur: 100 mg/dL — AB
Specific Gravity, Urine: 1.025 (ref 1.005–1.030)
pH: 5.5 (ref 5.0–8.0)

## 2021-01-18 LAB — PREGNANCY, URINE: Preg Test, Ur: NEGATIVE

## 2021-01-18 LAB — CBC WITH DIFFERENTIAL/PLATELET
Abs Immature Granulocytes: 0.09 10*3/uL — ABNORMAL HIGH (ref 0.00–0.07)
Basophils Absolute: 0 10*3/uL (ref 0.0–0.1)
Basophils Relative: 0 %
Eosinophils Absolute: 0.1 10*3/uL (ref 0.0–0.5)
Eosinophils Relative: 1 %
HCT: 39 % (ref 36.0–46.0)
Hemoglobin: 12.6 g/dL (ref 12.0–15.0)
Immature Granulocytes: 1 %
Lymphocytes Relative: 6 %
Lymphs Abs: 0.9 10*3/uL (ref 0.7–4.0)
MCH: 30.5 pg (ref 26.0–34.0)
MCHC: 32.3 g/dL (ref 30.0–36.0)
MCV: 94.4 fL (ref 80.0–100.0)
Monocytes Absolute: 1.2 10*3/uL — ABNORMAL HIGH (ref 0.1–1.0)
Monocytes Relative: 8 %
Neutro Abs: 12.2 10*3/uL — ABNORMAL HIGH (ref 1.7–7.7)
Neutrophils Relative %: 84 %
Platelets: 199 10*3/uL (ref 150–400)
RBC: 4.13 MIL/uL (ref 3.87–5.11)
RDW: 12.3 % (ref 11.5–15.5)
WBC: 14.5 10*3/uL — ABNORMAL HIGH (ref 4.0–10.5)
nRBC: 0 % (ref 0.0–0.2)

## 2021-01-18 LAB — COMPREHENSIVE METABOLIC PANEL
ALT: 15 U/L (ref 0–44)
AST: 22 U/L (ref 15–41)
Albumin: 4.1 g/dL (ref 3.5–5.0)
Alkaline Phosphatase: 64 U/L (ref 38–126)
Anion gap: 12 (ref 5–15)
BUN: 8 mg/dL (ref 6–20)
CO2: 26 mmol/L (ref 22–32)
Calcium: 9.8 mg/dL (ref 8.9–10.3)
Chloride: 95 mmol/L — ABNORMAL LOW (ref 98–111)
Creatinine, Ser: 0.92 mg/dL (ref 0.44–1.00)
GFR, Estimated: >60 mL/min (ref >60)
Glucose, Bld: 99 mg/dL (ref 70–99)
Potassium: 4.1 mmol/L (ref 3.5–5.1)
Sodium: 133 mmol/L — ABNORMAL LOW (ref 135–145)
Total Bilirubin: 0.7 mg/dL (ref 0.3–1.2)
Total Protein: 7.5 g/dL (ref 6.5–8.1)

## 2021-01-18 LAB — LIPASE, BLOOD: Lipase: <10 U/L — ABNORMAL LOW (ref 11–51)

## 2021-01-18 LAB — RESP PANEL BY RT-PCR (FLU A&B, COVID) ARPGX2
Influenza A by PCR: NEGATIVE
Influenza B by PCR: NEGATIVE
SARS Coronavirus 2 by RT PCR: NEGATIVE

## 2021-01-18 MED ORDER — ONDANSETRON HCL 4 MG/2ML IJ SOLN
4.0000 mg | Freq: Once | INTRAMUSCULAR | Status: AC
  Administered 2021-01-18: 4 mg via INTRAVENOUS
  Filled 2021-01-18: qty 2

## 2021-01-18 MED ORDER — LACTATED RINGERS IV BOLUS
1000.0000 mL | Freq: Once | INTRAVENOUS | Status: AC
  Administered 2021-01-18: 1000 mL via INTRAVENOUS

## 2021-01-18 MED ORDER — SUCRALFATE 1 G PO TABS: 1.0000 g | ORAL_TABLET | Freq: Three times a day (TID) | ORAL | 0 refills | Status: DC

## 2021-01-18 MED ORDER — KETOROLAC TROMETHAMINE 15 MG/ML IJ SOLN
15.0000 mg | Freq: Once | INTRAMUSCULAR | Status: AC
  Administered 2021-01-18: 15 mg via INTRAVENOUS
  Filled 2021-01-18: qty 1

## 2021-01-18 MED ORDER — HYDROCODONE-ACETAMINOPHEN 5-325 MG PO TABS: 2.0000 | ORAL_TABLET | ORAL | 0 refills | Status: DC | PRN

## 2021-01-18 MED ORDER — MORPHINE SULFATE (PF) 4 MG/ML IV SOLN
4.0000 mg | Freq: Once | INTRAVENOUS | Status: AC
  Administered 2021-01-18: 4 mg via INTRAVENOUS
  Filled 2021-01-18: qty 1

## 2021-01-18 NOTE — ED Notes (Signed)
Patient states only wants ice chips at this time  Provide such for PO challenge

## 2021-01-18 NOTE — ED Triage Notes (Signed)
Patient reports to the ER for flank pain, states she was recently diagnosed with Pyelonephritis

## 2021-01-18 NOTE — ED Provider Notes (Signed)
[facility] EMERGENCY DEPT Provider Note   CSN: [id number] Arrival date & time: 01/18/21  1438     History Chief Complaint  Patient presents with   Flank Pain    Misty Walsh is a 30 y.o. female.  This is a 30 y.o. female with significant medical history as below, including anxiety/depression, gerd, hld, obesity who presents to the ED with complaint of abdominal pain, n/v, unable to take oral medications last 24 hrs. Pt was recently diagnosed with pyelonephritis at ER. She was started on oral abx, motrin/tylenol, and zofran. Unable to take her abx last 24 hrs 2/2 worsening n/v, unrelieved with zofran. Last time she tried taking zofran was yesterday morning, none since then. Stopped taking motrin/tylenol. She has had reduced oral intake last 24 hrs, pain worsened since this morning. No fevers or chills, no cp or dib, no change to her urination. No diarrhea  The history is provided by the patient. No language interpreter was used.  Flank Pain Associated symptoms include abdominal pain. Pertinent negatives include no chest pain, no headaches and no shortness of breath.      Past Medical History:  Diagnosis Date   Anxiety    Back pain    Chlamydia    Depression med made her navel itching and  made her sleepy so she quit taking them   Edema, lower extremity    Elevated cholesterol    GERD (gastroesophageal reflux disease)    History of cesarean section, classical 12/18/2012   2014    Human papilloma virus    Hypertension    Joint pain    Moderate dysplasia of cervix    Numbness    right arm to hand   Ovarian cyst    Prediabetes     Patient Active Problem List   Diagnosis Date Noted   Symptomatic mammary hypertrophy 12/07/2019   Back pain 12/07/2019   Neck pain 12/07/2019   Morbid obesity (HCC) 11/09/2019   Prediabetes 11/04/2019   Elevated cholesterol 11/04/2019   Vitamin D deficiency 11/04/2019   Hemorrhagic ovarian cyst 06/21/2017   Moderate  dysplasia of cervix (CIN II) 02/03/2015   Cellulitis 01/06/2013   History of cesarean section, classical 12/18/2012   Allergy 10/29/2012    Past Surgical History:  Procedure Laterality Date   BREAST BIOPSY Left 2012   benign fibroadeoma   BREAST SURGERY     CESAREAN SECTION N/A 12/13/2012   Procedure: PRIMARY CESAREAN SECTION;  Surgeon: Kathreen Cosier, MD;  Location: [facility];  Service: Obstetrics;  Laterality: N/A;   LEEP       OB History     Gravida  1   Para  1   Term      Preterm  1   AB      Living  1      SAB      IAB      Ectopic      Multiple      Live Births              Family History  Problem Relation Age of Onset   Diabetes Mother    Heart disease Mother    Hypertension Mother    Depression Mother    Stroke Mother    Arthritis Mother    Stroke Father    High blood pressure Father    High Cholesterol Father    Depression Father    Anxiety disorder Father    Obesity Father  Asthma Sister    Kidney disease Brother        genetic condition   Diabetes Maternal Aunt    Hypertension Maternal Aunt    Asthma Maternal Aunt    Epilepsy Maternal Aunt    Hypertension Maternal Grandmother    Dementia Maternal Grandmother    Heart disease Maternal Grandmother    Diabetes Paternal Grandmother     Social History   Tobacco Use   Smoking status: Former    Packs/day: 0.25    Years: 5.00    Pack years: 1.25    Types: Cigarettes    Quit date: 12/20/2019    Years since quitting: 1.0   Smokeless tobacco: Never  Vaping Use   Vaping Use: Never used  Substance Use Topics   Alcohol use: Yes    Comment: occ   Drug use: No    Home Medications Prior to Admission medications   Medication Sig Start Date End Date Taking? Authorizing Provider  HYDROcodone-acetaminophen (NORCO/VICODIN) 5-325 MG tablet Take 2 tablets by mouth every 4 (four) hours as needed. 01/18/21  Yes Tanda Rockers A, DO  sucralfate (CARAFATE) 1 g tablet Take 1 tablet (1 g  total) by mouth 4 (four) times daily -  with meals and at bedtime for 7 days. 01/18/21 01/25/21 Yes Sloan Leiter, DO  traZODone (DESYREL) 50 MG tablet Take 1 tablet (50 mg total) by mouth at bedtime. Must follow-up for further refills. Call [phone]. Patient not taking: Reported on 01/16/2021 07/26/20   Dohmeier, Porfirio Mylar, MD  acetaminophen (TYLENOL) 325 MG tablet Take 650 mg by mouth every 6 (six) hours as needed for mild pain, fever or headache.    [provider]  cephALEXin (KEFLEX) 500 MG capsule Take 1 capsule (500 mg total) by mouth 3 (three) times daily for 7 days. 01/16/21 01/23/21  Linwood Dibbles, MD  ibuprofen (ADVIL) 200 MG tablet Take 600 mg by mouth every 6 (six) hours as needed for fever, headache or mild pain.    [provider]  Insulin Pen Needle 31G X 6 MM MISC Use daily with Victoza 02/22/20   Corinna Capra A, DO  lisinopril-hydrochlorothiazide (ZESTORETIC) 10-12.5 MG tablet TAKE 1 TABLET BY MOUTH ONCE DAILY FOR 30 DAYS 01/16/21   Linwood Dibbles, MD  ondansetron (ZOFRAN) 4 MG tablet Take 1 tablet (4 mg total) by mouth every 8 (eight) hours as needed for nausea or vomiting. 01/16/21   Linwood Dibbles, MD  sertraline (ZOLOFT) 50 MG tablet Take 0.5 tablets (25 mg total) by mouth daily. Patient not taking: Reported on 01/16/2021 06/05/20   Langston Reusing, MD    Allergies    Macrobid [nitrofurantoin macrocrystal]  Review of Systems   Review of Systems  Constitutional:  Negative for activity change and fever.  HENT:  Negative for facial swelling and trouble swallowing.   Eyes:  Negative for discharge and redness.  Respiratory:  Negative for cough and shortness of breath.   Cardiovascular:  Negative for chest pain and palpitations.  Gastrointestinal:  Positive for abdominal pain, nausea and vomiting.  Genitourinary:  Positive for flank pain. Negative for dysuria.  Musculoskeletal:  Negative for back pain and gait problem.  Skin:  Negative for pallor and rash.   Neurological:  Negative for syncope and headaches.   Physical Exam Updated Vital Signs BP (!) 116/56 (BP Location: Left Arm)   Pulse 99   Temp 99.2 F (37.3 C)   Resp 18   Ht 5\' 4"  (1.626 m)   Wt )  137 kg   LMP 01/09/2021 Comment: negative HCG quantitative 01/15/21  SpO2 99%   BMI 51.84 kg/m   Physical Exam Vitals and nursing note reviewed.  Constitutional:      General: She is not in acute distress.    Appearance: Normal appearance. She is obese.  HENT:     Head: Normocephalic and atraumatic.     Right Ear: External ear normal.     Left Ear: External ear normal.     Nose: Nose normal.     Mouth/Throat:     Mouth: Mucous membranes are moist.  Eyes:     General: No scleral icterus.       Right eye: No discharge.        Left eye: No discharge.  Cardiovascular:     Rate and Rhythm: Regular rhythm. Tachycardia present.     Pulses: Normal pulses.     Heart sounds: Normal heart sounds.  Pulmonary:     Effort: Pulmonary effort is normal. No respiratory distress.     Breath sounds: Normal breath sounds.  Abdominal:     General: Abdomen is flat.     Palpations: Abdomen is soft.     Tenderness: There is generalized abdominal tenderness.     Comments: Not peritoneal  Musculoskeletal:        General: Normal range of motion.     Cervical back: Normal range of motion.     Right lower leg: No edema.     Left lower leg: No edema.  Skin:    General: Skin is warm and dry.     Capillary Refill: Capillary refill takes less than 2 seconds.  Neurological:     Mental Status: She is alert.  Psychiatric:        Mood and Affect: Mood normal.        Behavior: Behavior normal.    ED Results / Procedures / Treatments   Labs (all labs ordered are listed, but only abnormal results are displayed) Labs Reviewed  URINALYSIS, ROUTINE W REFLEX MICROSCOPIC - Abnormal; Notable for the following components:      Result Value   APPearance HAZY (*)    Ketones, ur 15 (*)    Protein, ur  100 (*)    Leukocytes,Ua SMALL (*)    All other components within normal limits  CBC WITH DIFFERENTIAL/PLATELET - Abnormal; Notable for the following components:   WBC 14.5 (*)    Neutro Abs 12.2 (*)    Monocytes Absolute 1.2 (*)    Abs Immature Granulocytes 0.09 (*)    All other components within normal limits  COMPREHENSIVE METABOLIC PANEL - Abnormal; Notable for the following components:   Sodium 133 (*)    Chloride 95 (*)    All other components within normal limits  LIPASE, BLOOD - Abnormal; Notable for the following components:   Lipase <10 (*)    All other components within normal limits  RESP PANEL BY RT-PCR (FLU A&B, COVID) ARPGX2  PREGNANCY, URINE    EKG None  Radiology No results found.  Procedures Procedures   Medications Ordered in ED Medications  lactated ringers bolus 1,000 mL (0 mLs Intravenous Stopped 01/18/21 1724)  morphine 4 MG/ML injection 4 mg (4 mg Intravenous Given 01/18/21 1605)  ondansetron (ZOFRAN) injection 4 mg (4 mg Intravenous Given 01/18/21 1604)  ketorolac (TORADOL) 15 MG/ML injection 15 mg (15 mg Intravenous Given 01/18/21 1604)    ED Course  I have reviewed the triage vital signs and the nursing  notes.  Pertinent labs & imaging results that were available during my care of the patient were reviewed by me and considered in my medical decision making (see chart for details).  Clinical Course as of 01/18/21 1807  Thu Jan 18, 2021  1715 WBC improved from 11/28, urine without acute infection. Possible mild dehydration, she was given IVF, she is able to tolerate PO challenge [SG]    Clinical Course User Index [SG] Sloan Leiter, DO   MDM Rules/Calculators/A&P                           CC: abd pain, emesis/nausea  This patient complains of above; this involves an extensive number of treatment options and is a complaint that carries with it a high risk of complications and morbidity. Vital signs were reviewed. Serious etiologies  considered.  Record review:   Previous records obtained and reviewed   Work up as above, notable for:  Labs & imaging results that were available during my care of the patient were reviewed by me and considered in my medical decision making.   Management: Pt unable to tolerate PO, will give IVF, analgesics and anti emetics. Will rpt labs. Reviewed CT from 11/28, if labs stable it would be reasonable to not repeat this imaging. If labs acutely worse and pain unrelieved with medication would likely rpt CT.  Reassessment:  Pt does report she is feeling better. She is able to tolerate PO. Favor etiology of symptoms today 2/2 ongoing pyelonephritis. Given oral analgesics prescription for home. Advised her to continue taking home keflex. Bland diet. Discussed rehydration. She is comfortable with plan to discharge home.   Advised her to complete her course of oral antibiotics which was prescribed at previous encounter.  The patient improved significantly and was discharged in stable condition. Detailed discussions were had with the patient regarding current findings, and need for close f/u with PCP or on call doctor. The patient has been instructed to return immediately if the symptoms worsen in any way for re-evaluation. Patient verbalized understanding and is in agreement with current care plan. All questions answered prior to discharge.           This chart was dictated using voice recognition software.  Despite best efforts to proofread,  errors can occur which can change the documentation meaning.  Final Clinical Impression(s) / ED Diagnoses Final diagnoses:  Abdominal pain, unspecified abdominal location  Nausea and vomiting, unspecified vomiting type    Rx / DC Orders ED Discharge Orders          Ordered    HYDROcodone-acetaminophen (NORCO/VICODIN) 5-325 MG tablet  Every 4 hours PRN        01/18/21 1805    sucralfate (CARAFATE) 1 g tablet  3 times daily with meals & bedtime         01/18/21 1805             Sloan Leiter, DO 01/18/21 1807

## 2021-02-08 DIAGNOSIS — E119 Type 2 diabetes mellitus without complications: Secondary | ICD-10-CM | POA: Insufficient documentation

## 2021-02-15 ENCOUNTER — Encounter (HOSPITAL_COMMUNITY): Payer: Self-pay | Admitting: Radiology

## 2021-03-16 ENCOUNTER — Other Ambulatory Visit (INDEPENDENT_AMBULATORY_CARE_PROVIDER_SITE_OTHER): Payer: Self-pay | Admitting: Bariatrics

## 2021-03-19 NOTE — Telephone Encounter (Signed)
LOV w/ Dr. U

## 2021-06-15 ENCOUNTER — Ambulatory Visit
Admission: RE | Admit: 2021-06-15 | Discharge: 2021-06-15 | Disposition: A | Discharge status: Home / Self Care | Payer: Medicaid Other | Source: Ambulatory Visit | Attending: Emergency Medicine | Admitting: Emergency Medicine

## 2021-06-15 VITALS — BP 143/87 | HR 90 | Temp 98.0°F | Resp 20

## 2021-06-15 DIAGNOSIS — J302 Other seasonal allergic rhinitis: Secondary | ICD-10-CM | POA: Diagnosis not present

## 2021-06-15 DIAGNOSIS — H6693 Otitis media, unspecified, bilateral: Secondary | ICD-10-CM | POA: Insufficient documentation

## 2021-06-15 DIAGNOSIS — J309 Allergic rhinitis, unspecified: Secondary | ICD-10-CM | POA: Diagnosis not present

## 2021-06-15 DIAGNOSIS — J029 Acute pharyngitis, unspecified: Secondary | ICD-10-CM | POA: Insufficient documentation

## 2021-06-15 LAB — POCT RAPID STREP A (OFFICE): Rapid Strep A Screen: NEGATIVE

## 2021-06-15 MED ORDER — FLUOCINOLONE ACETONIDE 0.01 % OT OIL: 5.0000 [drp] | TOPICAL_OIL | Freq: Two times a day (BID) | OTIC | 0 refills | Status: DC | PRN

## 2021-06-15 MED ORDER — IBUPROFEN 400 MG PO TABS: 400.0000 mg | ORAL_TABLET | Freq: Three times a day (TID) | ORAL | 0 refills | Status: DC | PRN

## 2021-06-15 MED ORDER — FEXOFENADINE HCL 180 MG PO TABS: 180.0000 mg | ORAL_TABLET | Freq: Every day | ORAL | 1 refills | Status: DC

## 2021-06-15 MED ORDER — CEFDINIR 300 MG PO CAPS
300.0000 mg | ORAL_CAPSULE | Freq: Two times a day (BID) | ORAL | 0 refills | Status: AC
Start: 2021-06-15 — End: 2021-06-25

## 2021-06-15 MED ORDER — PSEUDOEPHEDRINE HCL 30 MG PO TABS
60.0000 mg | ORAL_TABLET | Freq: Three times a day (TID) | ORAL | 0 refills | Status: AC | PRN
Start: 2021-06-15 — End: 2021-06-18

## 2021-06-15 MED ORDER — FLUTICASONE PROPIONATE 50 MCG/ACT NA SUSP: 1.0000 | Freq: Every day | NASAL | 1 refills | Status: DC

## 2021-06-15 NOTE — ED Triage Notes (Signed)
Pt c/o sore throat that began yesterday and congestion that began today. ?

## 2021-06-15 NOTE — Discharge Instructions (Signed)
Your strep test today is negative.  Streptococcal throat culture will be performed per our protocol.  The result of your throat culture will be posted to your MyChart once it is complete, this typically takes 3 to 5 days.   ? ?Your symptoms and my physical exam findings are concerning for exacerbation of your underlying allergies.  It is important that you begin your allergy regimen now and are consistent with taking allergy medications exactly as prescribed.  Allergy medications are preventative and therefore only work well when they are taken daily, not "as needed". ?  ?Please see the list below for recommended medications, dosages and frequencies to provide relief of your current symptoms:   ?  ?Advil, Motrin (ibuprofen): This is a good anti-inflammatory medication which not only addresses aches, pains but also significantly reduces soft tissue inflammation of the upper airways that causes sinus and nasal congestion as well as inflammation of the lower airways which makes you feel like your breathing is constricted or your cough feel tight.  I recommend that you take between 400 mg every 8 hours as needed.    ?  ?Allegra (fexofenadine): This is an excellent second-generation antihistamine that helps to reduce respiratory inflammatory response to environmental allergens.  This medication is not known to cause daytime sleepiness so it can be taken in the daytime.  If you find that it does make you sleepy, please feel free to take it at bedtime. ?  ?Flonase (fluticasone): This is a steroid nasal spray that you use once daily, 1 spray in each nare.  This medication does not work well if you decide to use it only used as you feel you need to, it works best used on a daily basis.  After 3 to 5 days of use, you will notice significant reduction of the inflammation and mucus production that is currently being caused by exposure to allergens, whether seasonal or environmental.  The most common side effect of this  medication is nosebleeds.  If you experience a nosebleed, please discontinue use for 1 week, then feel free to resume.  I have provided you with a prescription but you can also purchase this medication over-the-counter if your insurance will not cover it. ?  ?Derm otic (fluocinolone): This is a topical steroid oil that you can drop into your ears twice daily for the next few days to calm down your ear canal.  While it will not treat an ear infection, it can calm the inflammation that is interfering with your body's ability to fight possible infection.  You may find that it also resolves the "whooshing" sound in your ears. ?  ?Pseudoephedrine (Sudafed): This is a decongestant.  This medication has to be purchased from the pharmacist counter, I recommend taking 2 tablets, 60 mg, 2-3 times a day as needed to relieve runny nose and sinus drainage.  This medication is available over-the-counter however I have sent a prescription for your convenience. ?  ?If all of the above medications, despite your best efforts to dry up your upper respiratory tract and mucous membranes, you continue to have pain in your ears or the pain in your ears becomes worse, please feel free to begin the antibiotic that I have sent to your pharmacy. ? ?Cefdinir (Omnicef): Take 1 capsule twice daily for 10 days, you can take it with or without food.  This antibiotic can cause upset stomach, this will resolve once antibiotics are complete.  You are welcome to use a probiotic, eat yogurt,  take Imodium while taking this medication.  Please avoid other systemic medications such as Maalox, Pepto-Bismol or milk of magnesia as they can interfere with your body's ability to absorb the antibiotics., It is very important that you take antibiotics as prescribed.  If you skip doses or do not complete the full course of antibiotics, you put yourself at significant risk of recurrent infection which can often be worse than your initial infection.  If your strep  throat culture is positive, please know that this antibiotic treats strep throat as well so no further antibiotics will be needed if your throat culture is positive. ? ?Please follow-up within the next 7-10 days either with your primary care provider or urgent care if your symptoms do not resolve.  If you do not have a primary care provider, we will assist you in finding one. ?  ?Thank you for visiting urgent care today.  We appreciate the opportunity to participate in your care. ? ?

## 2021-06-15 NOTE — ED Provider Notes (Signed)
?[facility] ? ? ? ?CSN: [id number] ?Arrival date & time: 06/15/21  1412 ?  ? ?HISTORY  ? ?Chief Complaint  ?Patient presents with  ? Sore Throat  ?  Entered by patient  ? ?HPI ?Misty Walsh is a 31 y.o. female. Pt c/o sore throat that began yesterday and congestion that began today.  Rapid strep test today is negative.  Patient's blood pressure is elevated on arrival today.  Patient is a normal temperature, normal heart rate, normal respirations and normal oxygen saturation.  Patient states she recently switched from Allegra to Zyrtec but does not feel that it is working very well.  Patient states 2 weeks ago she had what she thought was a full-blown cold the last week felt much better then this week noticed that she had a very sore throat and began to have a runny nose on the way over here to get her throat checked out.  She denies fever, body ache, chills, nausea, vomiting, diarrhea, headache, known sick contacts. ? ?The history is provided by the patient.  ?Past Medical History:  ?Diagnosis Date  ? Anxiety   ? Back pain   ? Chlamydia   ? Depression med made her navel itching and  made her sleepy so she quit taking them  ? Edema, lower extremity   ? Elevated cholesterol   ? GERD (gastroesophageal reflux disease)   ? History of cesarean section, classical 12/18/2012  ? 2014   ? Human papilloma virus   ? Hypertension   ? Joint pain   ? Moderate dysplasia of cervix   ? Numbness   ? right arm to hand  ? Ovarian cyst   ? Prediabetes   ? ?Patient Active Problem List  ? Diagnosis Date Noted  ? Symptomatic mammary hypertrophy 12/07/2019  ? Back pain 12/07/2019  ? Neck pain 12/07/2019  ? Morbid obesity (HCC) 11/09/2019  ? Prediabetes 11/04/2019  ? Elevated cholesterol 11/04/2019  ? Vitamin D deficiency 11/04/2019  ? Hemorrhagic ovarian cyst 06/21/2017  ? 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 Cellulitis 01/06/2013  ? History of cesarean section, classical 12/18/2012  ? Allergy 10/29/2012   ? ?Past Surgical History:  ?Procedure Laterality Date  ? BREAST BIOPSY Left 2012  ? benign fibroadeoma  ? BREAST SURGERY    ? CESAREAN SECTION N/A 12/13/2012  ? Procedure: PRIMARY CESAREAN SECTION;  Surgeon: Kathreen CosierBernard A Marshall, MD;  Location: [facility];  Service: Obstetrics;  Laterality: N/A;  ? LEEP    ? ?OB History   ? ? Gravida  ?1  ? Para  ?1  ? Term  ?   ? Preterm  ?1  ? AB  ?   ? Living  ?1  ?  ? ? SAB  ?   ? IAB  ?   ? Ectopic  ?   ? Multiple  ?   ? Live Births  ?   ?   ?  ?  ? ?Home Medications   ? ?Prior to Admission medications   ?Medication Sig Start Date End Date Taking? Authorizing Provider  ?Insulin Pen Needle 31G X 6 MM MISC Use daily with Victoza 02/22/20   Corinna CapraBrown, Angel A, DO  ?lisinopril-hydrochlorothiazide (ZESTORETIC) 10-12.5 MG tablet TAKE 1 TABLET BY MOUTH ONCE DAILY FOR 30 DAYS 01/16/21   Linwood DibblesKnapp, Jon, MD  ? ?Family History ?Family History  ?Problem Relation Age of Onset  ? Diabetes Mother   ? Heart disease Mother   ? Hypertension Mother   ?  Depression Mother   ? Stroke Mother   ? Arthritis Mother   ? Stroke Father   ? High blood pressure Father   ? High Cholesterol Father   ? Depression Father   ? Anxiety disorder Father   ? Obesity Father   ? Asthma Sister   ? Kidney disease Brother   ?     genetic condition  ? Diabetes Maternal Aunt   ? Hypertension Maternal Aunt   ? Asthma Maternal Aunt   ? Epilepsy Maternal Aunt   ? Hypertension Maternal Grandmother   ? Dementia Maternal Grandmother   ? Heart disease Maternal Grandmother   ? Diabetes Paternal Grandmother   ? ?Social History ?Social History  ? ?Tobacco Use  ? Smoking status: Former  ?  Packs/day: 0.25  ?  Years: 5.00  ?  Pack years: 1.25  ?  Types: Cigarettes  ?  Quit date: 12/20/2019  ?  Years since quitting: 1.4  ? Smokeless tobacco: Never  ?Vaping Use  ? Vaping Use: Never used  ?Substance Use Topics  ? Alcohol use: Yes  ?  Comment: occ  ? Drug use: No  ? ?Allergies   ?Macrobid [nitrofurantoin macrocrystal] ? ?Review of Systems ?Review of  Systems ?Pertinent findings noted in history of present illness.  ? ?Physical Exam ?Triage Vital Signs ?ED Triage Vitals  ?Enc Vitals Group  ?   BP 12/15/20 0827 (!) 147/82  ?   Pulse Rate 12/15/20 0827 72  ?   Resp 12/15/20 0827 18  ?   Temp 12/15/20 0827 98.3 ?F (36.8 ?C)  ?   Temp Source 12/15/20 0827 Oral  ?   SpO2 12/15/20 0827 98 %  ?   Weight --   ?   Height --   ?   Head Circumference --   ?   Peak Flow --   ?   Pain Score 12/15/20 0826 5  ?   Pain Loc --   ?   Pain Edu? --   ?   Excl. in GC? --   ?No data found. ? ?Updated Vital Signs ?BP (!) 143/87 (BP Location: Right Arm)   Pulse 90   Temp 98 ?F (36.7 ?C) (Oral)   Resp 20   LMP 06/08/2021   SpO2 97%  ? ?Physical Exam ?Vitals and nursing note reviewed.  ?Constitutional:   ?   General: She is not in acute distress. ?   Appearance: Normal appearance. She is not ill-appearing.  ?HENT:  ?   Head: Normocephalic and atraumatic.  ?   Salivary Glands: Right salivary gland is not diffusely enlarged or tender. Left salivary gland is not diffusely enlarged or tender.  ?   Right Ear: Ear canal and external ear normal. No drainage. A middle ear effusion (murky) is present. There is no impacted cerumen. Tympanic membrane is bulging. Tympanic membrane is not injected or erythematous.  ?   Left Ear: Ear canal and external ear normal. No drainage. A middle ear effusion (Mildly purulent) is present. There is no impacted cerumen. Tympanic membrane is bulging. Tympanic membrane is not injected or erythematous.  ?   Ears:  ?   Comments: Bilateral EACs normal, both TMs bulging with clear fluid ?   Nose: Mucosal edema, congestion and rhinorrhea present. No nasal deformity, septal deviation, signs of injury or nasal tenderness. Rhinorrhea is clear.  ?   Right Nostril: Occlusion present. No foreign body, epistaxis or septal hematoma.  ?   Left Nostril: Occlusion  present. No foreign body, epistaxis or septal hematoma.  ?   Right Turbinates: Enlarged, swollen and pale.  ?    Left Turbinates: Enlarged, swollen and pale.  ?   Right Sinus: No maxillary sinus tenderness or frontal sinus tenderness.  ?   Left Sinus: No maxillary sinus tenderness or frontal sinus tenderness.  ?   Mouth/Throat:  ?   Lips: Pink. No lesions.  ?   Mouth: Mucous membranes are moist. No oral lesions.  ?   Dentition: Normal dentition.  ?   Tongue: No lesions.  ?   Palate: No mass.  ?   Pharynx: Uvula midline. Pharyngeal swelling, posterior oropharyngeal erythema and uvula swelling present. No oropharyngeal exudate.  ?   Tonsils: No tonsillar exudate. 0 on the right. 0 on the left.  ?   Comments: Postnasal drip ?Eyes:  ?   General: Lids are normal.     ?   Right eye: No discharge.     ?   Left eye: No discharge.  ?   Extraocular Movements: Extraocular movements intact.  ?   Conjunctiva/sclera: Conjunctivae normal.  ?   Right eye: Right conjunctiva is not injected.  ?   Left eye: Left conjunctiva is not injected.  ?Neck:  ?   Trachea: Trachea and phonation normal.  ?Cardiovascular:  ?   Rate and Rhythm: Normal rate and regular rhythm.  ?   Pulses: Normal pulses.  ?   Heart sounds: Normal heart sounds. No murmur heard. ?  No friction rub. No gallop.  ?Pulmonary:  ?   Effort: Pulmonary effort is normal. No tachypnea, bradypnea, accessory muscle usage, prolonged expiration, respiratory distress or retractions.  ?   Breath sounds: Normal breath sounds. No stridor, decreased air movement or transmitted upper airway sounds. No decreased breath sounds, wheezing, rhonchi or rales.  ?Chest:  ?   Chest wall: No tenderness.  ?Musculoskeletal:     ?   General: Normal range of motion.  ?   Cervical back: Normal range of motion and neck supple. Normal range of motion.  ?Lymphadenopathy:  ?   Cervical: Cervical adenopathy present.  ?   Right cervical: Superficial cervical adenopathy and posterior cervical adenopathy present.  ?   Left cervical: Superficial cervical adenopathy and posterior cervical adenopathy present.  ?Skin: ?    General: Skin is warm and dry.  ?   Findings: No erythema or rash.  ?Neurological:  ?   General: No focal deficit present.  ?   Mental Status: She is alert and oriented to person, place, and time.  ?Psychiatric:     ?   Mo

## 2021-06-18 ENCOUNTER — Telehealth (HOSPITAL_COMMUNITY): Payer: Self-pay

## 2021-06-18 LAB — CULTURE, GROUP A STREP (THRC)

## 2021-06-18 MED ORDER — AMOXICILLIN 500 MG PO CAPS
500.0000 mg | ORAL_CAPSULE | Freq: Two times a day (BID) | ORAL | 0 refills | Status: AC
Start: 2021-06-18 — End: 2021-06-28

## 2021-06-18 NOTE — Progress Notes (Signed)
Attempted to reach patient x 1, unable to leave VM. Medication sent to pharmacy on file.

## 2021-08-17 ENCOUNTER — Encounter (HOSPITAL_COMMUNITY): Payer: Self-pay

## 2021-08-17 ENCOUNTER — Ambulatory Visit (HOSPITAL_COMMUNITY)
Admission: RE | Admit: 2021-08-17 | Discharge: 2021-08-17 | Disposition: A | Discharge status: Home / Self Care | Payer: Medicaid Other | Source: Ambulatory Visit | Attending: Student | Admitting: Student

## 2021-08-17 VITALS — BP 157/98 | HR 82 | Temp 98.7°F | Resp 18

## 2021-08-17 DIAGNOSIS — N3001 Acute cystitis with hematuria: Secondary | ICD-10-CM | POA: Diagnosis present

## 2021-08-17 LAB — POCT URINALYSIS DIPSTICK, ED / UC
Glucose, UA: NEGATIVE mg/dL
Ketones, ur: NEGATIVE mg/dL
Nitrite: NEGATIVE
Protein, ur: 30 mg/dL — AB
Specific Gravity, Urine: >=1.03 (ref 1.005–1.030)
Urobilinogen, UA: 0.2 mg/dL (ref 0.0–1.0)
pH: 5 (ref 5.0–8.0)

## 2021-08-17 MED ORDER — CEPHALEXIN 500 MG PO CAPS: 500.0000 mg | ORAL_CAPSULE | Freq: Two times a day (BID) | ORAL | 0 refills | Status: AC

## 2021-08-17 NOTE — Discharge Instructions (Signed)
-  Keflex twice daily x7 days -Drink plenty of water -Can take cranberry supplement or Azo for additional relief.

## 2021-08-17 NOTE — ED Provider Notes (Signed)
[facility]    CSN: [id number] Arrival date & time: 08/17/21  1028      History   Chief Complaint Chief Complaint  Patient presents with   Generalized Body Aches    Pain and discomfort when urinating and a bit constipated - Entered by patient    HPI Misty Walsh is a 31 y.o. female presenting with dysuria for 2 days.  History pyelonephritis, UTI, chlamydia, prediabetes, obesity.  Describes frequency, lower abdominal pain, dysuria.  Denies flank pain, gross hematuria, vaginal discharge, vaginal irritation.  Has taken a cranberry supplement but no other medications for the symptoms.  HPI  Past Medical History:  Diagnosis Date   Anxiety    Back pain    Chlamydia    Depression med made her navel itching and  made her sleepy so she quit taking them   Edema, lower extremity    Elevated cholesterol    GERD (gastroesophageal reflux disease)    History of cesarean section, classical 12/18/2012   2014    Human papilloma virus    Hypertension    Joint pain    Moderate dysplasia of cervix    Numbness    right arm to hand   Ovarian cyst    Prediabetes     Patient Active Problem List   Diagnosis Date Noted   Symptomatic mammary hypertrophy 12/07/2019   Back pain 12/07/2019   Neck pain 12/07/2019   Morbid obesity (HCC) 11/09/2019   Prediabetes 11/04/2019   Elevated cholesterol 11/04/2019   Vitamin D deficiency 11/04/2019   Hemorrhagic ovarian cyst 06/21/2017   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   Cellulitis 01/06/2013   History of cesarean section, classical 12/18/2012   Allergy 10/29/2012    Past Surgical History:  Procedure Laterality Date   BREAST BIOPSY Left 2012   benign fibroadeoma   BREAST SURGERY     CESAREAN SECTION N/A 12/13/2012   Procedure: PRIMARY CESAREAN SECTION;  Surgeon: Kathreen Cosier, MD;  Location: [facility];  Service: Obstetrics;  Laterality: N/A;   LEEP      OB History     Gravida  1   Para  1   Term       Preterm  1   AB      Living  1      SAB      IAB      Ectopic      Multiple      Live Births               Home Medications    Prior to Admission medications   Medication Sig Start Date End Date Taking? Authorizing Provider  cephALEXin (KEFLEX) 500 MG capsule Take 1 capsule (500 mg total) by mouth 2 (two) times daily for 7 days. 08/17/21 08/24/21 Yes Rhys Martini, PA-C  acetaminophen (TYLENOL) 325 MG tablet Take 650 mg by mouth every 6 (six) hours as needed for mild pain, fever or headache.    [provider]  atorvastatin (LIPITOR) 80 MG tablet Take 80 mg by mouth daily. 03/16/21   [provider]  busPIRone (BUSPAR) 15 MG tablet Take 15 mg by mouth 2 (two) times daily. 06/12/21   [provider]  fexofenadine (ALLEGRA) 180 MG tablet Take 1 tablet (180 mg total) by mouth daily. 06/15/21 12/12/21  Theadora Rama Scales, PA-C  Fluocinolone Acetonide (DERMOTIC) 0.01 % OIL Place 5 drops in ear(s) 2 (two) times daily as needed (Itching in  ears). 06/15/21   Theadora Rama Scales, PA-C  fluticasone (FLONASE) 50 MCG/ACT nasal spray Place 1 spray into both nostrils daily. Begin by using 2 sprays in each nare daily for 3 to 5 days, then decrease to 1 spray in each nare daily. 06/15/21   Theadora Rama Scales, PA-C  ibuprofen (ADVIL) 400 MG tablet Take 1 tablet (400 mg total) by mouth every 8 (eight) hours as needed for up to 30 doses. 06/15/21   Theadora Rama Scales, PA-C  Insulin Pen Needle 31G X 6 MM MISC Use daily with Victoza 02/22/20   Corinna Capra A, DO  ondansetron (ZOFRAN) 4 MG tablet Take 1 tablet (4 mg total) by mouth every 8 (eight) hours as needed for nausea or vomiting. 01/16/21   Linwood Dibbles, MD  sertraline (ZOLOFT) 25 MG tablet Take 25 mg by mouth daily. 03/16/21   [provider]  VICTOZA 18 MG/3ML SOPN Inject into the skin. 06/13/21   [provider]    Family History Family History  Problem Relation Age of Onset    Diabetes Mother    Heart disease Mother    Hypertension Mother    Depression Mother    Stroke Mother    Arthritis Mother    Stroke Father    High blood pressure Father    High Cholesterol Father    Depression Father    Anxiety disorder Father    Obesity Father    Asthma Sister    Kidney disease Brother        genetic condition   Diabetes Maternal Aunt    Hypertension Maternal Aunt    Asthma Maternal Aunt    Epilepsy Maternal Aunt    Hypertension Maternal Grandmother    Dementia Maternal Grandmother    Heart disease Maternal Grandmother    Diabetes Paternal Grandmother     Social History Social History   Tobacco Use   Smoking status: Former    Packs/day: 0.25    Years: 5.00    Total pack years: 1.25    Types: Cigarettes    Quit date: 12/20/2019    Years since quitting: 1.6   Smokeless tobacco: Never  Vaping Use   Vaping Use: Never used  Substance Use Topics   Alcohol use: Yes    Comment: occ   Drug use: No     Allergies   Macrobid [nitrofurantoin macrocrystal]   Review of Systems Review of Systems  Constitutional:  Negative for appetite change, chills, diaphoresis and fever.  Respiratory:  Negative for shortness of breath.   Cardiovascular:  Negative for chest pain.  Gastrointestinal:  Negative for abdominal pain, blood in stool, constipation, diarrhea, nausea and vomiting.  Genitourinary:  Positive for dysuria and frequency. Negative for decreased urine volume, difficulty urinating, flank pain, genital sores, hematuria, urgency and vaginal discharge.  Musculoskeletal:  Negative for back pain.  Neurological:  Negative for dizziness, weakness and light-headedness.  All other systems reviewed and are negative.    Physical Exam Triage Vital Signs ED Triage Vitals  Enc Vitals Group     BP 08/17/21 1104 (!) 157/98     Pulse Rate 08/17/21 1104 82     Resp 08/17/21 1104 18     Temp 08/17/21 1104 98.7 F (37.1 C)     Temp Source 08/17/21 1104 Oral      SpO2 08/17/21 1104 93 %     Weight --      Height --      Head Circumference --  Peak Flow --      Pain Score 08/17/21 1144 0     Pain Loc --      Pain Edu? --      Excl. in GC? --    No data found.  Updated Vital Signs BP (!) 157/98 (BP Location: Left Arm)   Pulse 82   Temp 98.7 F (37.1 C) (Oral)   Resp 18   SpO2 93%   Visual Acuity Right Eye Distance:   Left Eye Distance:   Bilateral Distance:    Right Eye Near:   Left Eye Near:    Bilateral Near:     Physical Exam Vitals reviewed.  Constitutional:      General: She is not in acute distress.    Appearance: Normal appearance. She is not ill-appearing.  HENT:     Head: Normocephalic and atraumatic.     Mouth/Throat:     Mouth: Mucous membranes are moist.     Comments: Moist mucous membranes Eyes:     Extraocular Movements: Extraocular movements intact.     Pupils: Pupils are equal, round, and reactive to light.  Cardiovascular:     Rate and Rhythm: Normal rate and regular rhythm.     Heart sounds: Normal heart sounds.  Pulmonary:     Effort: Pulmonary effort is normal.     Breath sounds: Normal breath sounds. No wheezing, rhonchi or rales.  Abdominal:     General: Bowel sounds are normal. There is no distension.     Palpations: Abdomen is soft. There is no mass.     Tenderness: There is no abdominal tenderness. There is no right CVA tenderness, left CVA tenderness, guarding or rebound.  Skin:    General: Skin is warm.     Capillary Refill: Capillary refill takes less than 2 seconds.     Comments: Good skin turgor  Neurological:     General: No focal deficit present.     Mental Status: She is alert and oriented to person, place, and time.  Psychiatric:        Mood and Affect: Mood normal.        Behavior: Behavior normal.      UC Treatments / Results  Labs (all labs ordered are listed, but only abnormal results are displayed) Labs Reviewed  POCT URINALYSIS DIPSTICK, ED / UC - Abnormal; Notable  for the following components:      Result Value   Bilirubin Urine SMALL (*)    Hgb urine dipstick MODERATE (*)    Protein, ur 30 (*)    Leukocytes,Ua LARGE (*)    All other components within normal limits  URINE CULTURE    EKG   Radiology No results found.  Procedures Procedures (including critical care time)  Medications Ordered in UC Medications - No data to display  Initial Impression / Assessment and Plan / UC Course  I have reviewed the triage vital signs and the nursing notes.  Pertinent labs & imaging results that were available during my care of the patient were reviewed by me and considered in my medical decision making (see chart for details).     This patient is a very pleasant 31 y.o. year old female presenting with acute cystitis. Afebrile, nontachycardic, no reproducible abd pain or CVAT.  UA with moderate blood, large leuk. Has not taken Azo. Culture sent.  Denies vaginal symptoms.   Keflex sent. Good hydration.   ED return precautions discussed. Patient verbalizes understanding and agreement.  Final Clinical Impressions(s) / UC Diagnoses   Final diagnoses:  Acute cystitis with hematuria     Discharge Instructions      -Keflex twice daily x7 days -Drink plenty of water -Can take cranberry supplement or Azo for additional relief.    ED Prescriptions     Medication Sig Dispense Auth. Provider   cephALEXin (KEFLEX) 500 MG capsule Take 1 capsule (500 mg total) by mouth 2 (two) times daily for 7 days. 14 capsule Rhys Martini, PA-C      PDMP not reviewed this encounter.   Rhys Martini, PA-C 08/17/21 1214

## 2021-08-17 NOTE — ED Triage Notes (Signed)
Pt reports dysuria for several days. She c.o body aches and pain.

## 2021-08-18 LAB — URINE CULTURE: Culture: <10000 — AB

## 2021-09-26 ENCOUNTER — Encounter (INDEPENDENT_AMBULATORY_CARE_PROVIDER_SITE_OTHER): Payer: Self-pay

## 2021-12-20 ENCOUNTER — Institutional Professional Consult (permissible substitution): Payer: Medicaid Other | Admitting: Neurology

## 2021-12-20 ENCOUNTER — Encounter: Payer: Self-pay | Admitting: Neurology

## 2022-02-13 ENCOUNTER — Encounter (HOSPITAL_BASED_OUTPATIENT_CLINIC_OR_DEPARTMENT_OTHER): Payer: Self-pay

## 2022-02-13 ENCOUNTER — Emergency Department (HOSPITAL_BASED_OUTPATIENT_CLINIC_OR_DEPARTMENT_OTHER)
Admission: EM | Admit: 2022-02-13 | Discharge: 2022-02-14 | Disposition: A | Discharge status: Home / Self Care | Payer: Medicaid Other | Attending: Emergency Medicine | Admitting: Emergency Medicine

## 2022-02-13 ENCOUNTER — Emergency Department (HOSPITAL_BASED_OUTPATIENT_CLINIC_OR_DEPARTMENT_OTHER): Payer: Medicaid Other

## 2022-02-13 ENCOUNTER — Other Ambulatory Visit: Payer: Self-pay

## 2022-02-13 DIAGNOSIS — N7093 Salpingitis and oophoritis, unspecified: Secondary | ICD-10-CM

## 2022-02-13 DIAGNOSIS — N739 Female pelvic inflammatory disease, unspecified: Secondary | ICD-10-CM | POA: Insufficient documentation

## 2022-02-13 DIAGNOSIS — I10 Essential (primary) hypertension: Secondary | ICD-10-CM | POA: Diagnosis not present

## 2022-02-13 DIAGNOSIS — N7091 Salpingitis, unspecified: Secondary | ICD-10-CM | POA: Diagnosis not present

## 2022-02-13 DIAGNOSIS — Z20822 Contact with and (suspected) exposure to covid-19: Secondary | ICD-10-CM | POA: Insufficient documentation

## 2022-02-13 DIAGNOSIS — R109 Unspecified abdominal pain: Secondary | ICD-10-CM | POA: Insufficient documentation

## 2022-02-13 DIAGNOSIS — N730 Acute parametritis and pelvic cellulitis: Secondary | ICD-10-CM | POA: Diagnosis not present

## 2022-02-13 DIAGNOSIS — M791 Myalgia, unspecified site: Secondary | ICD-10-CM | POA: Diagnosis present

## 2022-02-13 DIAGNOSIS — E119 Type 2 diabetes mellitus without complications: Secondary | ICD-10-CM | POA: Insufficient documentation

## 2022-02-13 LAB — CBC
HCT: 38.3 % (ref 36.0–46.0)
Hemoglobin: 12.9 g/dL (ref 12.0–15.0)
MCH: 31.1 pg (ref 26.0–34.0)
MCHC: 33.7 g/dL (ref 30.0–36.0)
MCV: 92.3 fL (ref 80.0–100.0)
Platelets: 236 10*3/uL (ref 150–400)
RBC: 4.15 MIL/uL (ref 3.87–5.11)
RDW: 12.2 % (ref 11.5–15.5)
WBC: 10.6 10*3/uL — ABNORMAL HIGH (ref 4.0–10.5)
nRBC: 0 % (ref 0.0–0.2)

## 2022-02-13 LAB — URINALYSIS, ROUTINE W REFLEX MICROSCOPIC
Bilirubin Urine: NEGATIVE
Glucose, UA: NEGATIVE mg/dL
Ketones, ur: NEGATIVE mg/dL
Nitrite: NEGATIVE
Protein, ur: 30 mg/dL — AB
Specific Gravity, Urine: 1.035 — ABNORMAL HIGH (ref 1.005–1.030)
WBC, UA: >50 WBC/hpf — ABNORMAL HIGH (ref 0–5)
pH: 5.5 (ref 5.0–8.0)

## 2022-02-13 LAB — COMPREHENSIVE METABOLIC PANEL
ALT: 17 U/L (ref 0–44)
AST: 15 U/L (ref 15–41)
Albumin: 4.4 g/dL (ref 3.5–5.0)
Alkaline Phosphatase: 64 U/L (ref 38–126)
Anion gap: 11 (ref 5–15)
BUN: 8 mg/dL (ref 6–20)
CO2: 24 mmol/L (ref 22–32)
Calcium: 9.6 mg/dL (ref 8.9–10.3)
Chloride: 101 mmol/L (ref 98–111)
Creatinine, Ser: 0.89 mg/dL (ref 0.44–1.00)
GFR, Estimated: >60 mL/min (ref >60)
Glucose, Bld: 190 mg/dL — ABNORMAL HIGH (ref 70–99)
Potassium: 3.7 mmol/L (ref 3.5–5.1)
Sodium: 136 mmol/L (ref 135–145)
Total Bilirubin: 0.8 mg/dL (ref 0.3–1.2)
Total Protein: 7.5 g/dL (ref 6.5–8.1)

## 2022-02-13 LAB — RESP PANEL BY RT-PCR (RSV, FLU A&B, COVID)  RVPGX2
Influenza A by PCR: NEGATIVE
Influenza B by PCR: NEGATIVE
Resp Syncytial Virus by PCR: NEGATIVE
SARS Coronavirus 2 by RT PCR: NEGATIVE

## 2022-02-13 LAB — LIPASE, BLOOD: Lipase: 11 U/L (ref 11–51)

## 2022-02-13 LAB — PREGNANCY, URINE: Preg Test, Ur: NEGATIVE

## 2022-02-13 MED ORDER — ACETAMINOPHEN 500 MG PO TABS
1000.0000 mg | ORAL_TABLET | Freq: Once | ORAL | Status: AC
  Administered 2022-02-13: 1000 mg via ORAL
  Filled 2022-02-13: qty 2

## 2022-02-13 MED ORDER — HYDROCODONE-ACETAMINOPHEN 5-325 MG PO TABS: 1.0000 | ORAL_TABLET | ORAL | 0 refills | Status: DC | PRN

## 2022-02-13 MED ORDER — SODIUM CHLORIDE 0.9 % IV SOLN
1.0000 g | Freq: Once | INTRAVENOUS | Status: AC
  Administered 2022-02-13: 1 g via INTRAVENOUS
  Filled 2022-02-13: qty 10

## 2022-02-13 MED ORDER — IOHEXOL 350 MG/ML SOLN
100.0000 mL | Freq: Once | INTRAVENOUS | Status: AC | PRN
  Administered 2022-02-13: 100 mL via INTRAVENOUS

## 2022-02-13 MED ORDER — SODIUM CHLORIDE 0.9 % IV BOLUS
1000.0000 mL | Freq: Once | INTRAVENOUS | Status: AC
  Administered 2022-02-13: 1000 mL via INTRAVENOUS

## 2022-02-13 MED ORDER — IBUPROFEN 800 MG PO TABS: 800.0000 mg | ORAL_TABLET | Freq: Three times a day (TID) | ORAL | 0 refills | Status: DC

## 2022-02-13 MED ORDER — KETOROLAC TROMETHAMINE 30 MG/ML IJ SOLN
30.0000 mg | Freq: Once | INTRAMUSCULAR | Status: AC
  Administered 2022-02-13: 30 mg via INTRAVENOUS
  Filled 2022-02-13: qty 1

## 2022-02-13 MED ORDER — DOXYCYCLINE HYCLATE 100 MG PO CAPS: 100.0000 mg | ORAL_CAPSULE | Freq: Two times a day (BID) | ORAL | 0 refills | Status: DC

## 2022-02-13 MED ORDER — DOXYCYCLINE HYCLATE 100 MG PO TABS
100.0000 mg | ORAL_TABLET | Freq: Once | ORAL | Status: AC
  Administered 2022-02-14: 100 mg via ORAL
  Filled 2022-02-13: qty 1

## 2022-02-13 NOTE — Discharge Instructions (Addendum)
Sexual partners need to be checked for STIs.  No sex until cleared by ObGyn.

## 2022-02-13 NOTE — ED Notes (Signed)
Pt has since returned from ultrasound -- provider and ED tech [facility] now at bedside to perform pelvic exam

## 2022-02-13 NOTE — ED Notes (Addendum)
Late entry -- Pt OTF to ultrasound

## 2022-02-13 NOTE — ED Provider Notes (Signed)
[facility] EMERGENCY DEPT Provider Note   CSN: [id number] Arrival date & time: 02/13/22  1438     History  Chief Complaint  Patient presents with   Generalized Body Aches    Misty Walsh is a 31 y.o. female.  Pt is a 31 yo female with pmhx significant for htn, ovarian cyst, anxiety, depression, gerd, hld, and dm.  Pt said she has had body aches, back pain, and lower abd pain.  Pt said sx have been going on for a few days.  No fevers.       Home Medications Prior to Admission medications   Medication Sig Start Date End Date Taking? Authorizing Provider  doxycycline (VIBRAMYCIN) 100 MG capsule Take 1 capsule (100 mg total) by mouth 2 (two) times daily. 02/13/22  Yes Jacalyn Lefevre, MD  HYDROcodone-acetaminophen (NORCO/VICODIN) 5-325 MG tablet Take 1 tablet by mouth every 4 (four) hours as needed. 02/13/22  Yes Jacalyn Lefevre, MD  ibuprofen (ADVIL) 800 MG tablet Take 1 tablet (800 mg total) by mouth 3 (three) times daily. 02/13/22  Yes Jacalyn Lefevre, MD  acetaminophen (TYLENOL) 325 MG tablet Take 650 mg by mouth every 6 (six) hours as needed for mild pain, fever or headache.    [provider]  atorvastatin (LIPITOR) 80 MG tablet Take 80 mg by mouth daily. 03/16/21   [provider]  busPIRone (BUSPAR) 15 MG tablet Take 15 mg by mouth 2 (two) times daily. 06/12/21   [provider]  fexofenadine (ALLEGRA) 180 MG tablet Take 1 tablet (180 mg total) by mouth daily. 06/15/21 12/12/21  Theadora Rama Scales, PA-C  Fluocinolone Acetonide (DERMOTIC) 0.01 % OIL Place 5 drops in ear(s) 2 (two) times daily as needed (Itching in ears). 06/15/21   Theadora Rama Scales, PA-C  fluticasone (FLONASE) 50 MCG/ACT nasal spray Place 1 spray into both nostrils daily. Begin by using 2 sprays in each nare daily for 3 to 5 days, then decrease to 1 spray in each nare daily. 06/15/21   Theadora Rama Scales, PA-C  ibuprofen (ADVIL) 400 MG tablet Take 1 tablet  (400 mg total) by mouth every 8 (eight) hours as needed for up to 30 doses. 06/15/21   Theadora Rama Scales, PA-C  Insulin Pen Needle 31G X 6 MM MISC Use daily with Victoza 02/22/20   Corinna Capra A, DO  ondansetron (ZOFRAN) 4 MG tablet Take 1 tablet (4 mg total) by mouth every 8 (eight) hours as needed for nausea or vomiting. 01/16/21   Linwood Dibbles, MD  sertraline (ZOLOFT) 25 MG tablet Take 25 mg by mouth daily. 03/16/21   [provider]  VICTOZA 18 MG/3ML SOPN Inject into the skin. 06/13/21   [provider]      Allergies    Macrobid [nitrofurantoin macrocrystal]    Review of Systems   Review of Systems  Gastrointestinal:  Positive for abdominal pain.  Musculoskeletal:  Positive for back pain.  All other systems reviewed and are negative.   Physical Exam Updated Vital Signs BP 117/79   Pulse 80   Temp 98.1 F (36.7 C) (Oral)   Resp 16   Ht  (1.626 m)   Wt (!) 137 kg   SpO2 100%   BMI 51.84 kg/m  Physical Exam Vitals and nursing note reviewed. Exam conducted with a chaperone present.  Constitutional:      Appearance: Normal appearance. She is obese.  HENT:     Head: Normocephalic and atraumatic.     Right  Ear: External ear normal.     Left Ear: External ear normal.     Nose: Nose normal.     Mouth/Throat:     Mouth: Mucous membranes are moist.     Pharynx: Oropharynx is clear.  Eyes:     Extraocular Movements: Extraocular movements intact.     Conjunctiva/sclera: Conjunctivae normal.     Pupils: Pupils are equal, round, and reactive to light.  Cardiovascular:     Rate and Rhythm: Normal rate and regular rhythm.     Pulses: Normal pulses.     Heart sounds: Normal heart sounds.  Pulmonary:     Effort: Pulmonary effort is normal.     Breath sounds: Normal breath sounds.  Abdominal:     General: Abdomen is flat. Bowel sounds are normal.     Palpations: Abdomen is soft.     Tenderness: There is abdominal tenderness in the suprapubic area.   Genitourinary:    Exam position: Lithotomy position.     Cervix: Discharge present.     Uterus: Normal.      Adnexa:        Right: Tenderness present.        Left: Tenderness present.   Musculoskeletal:        General: Normal range of motion.       Arms:     Cervical back: Normal range of motion and neck supple.  Skin:    General: Skin is warm.     Capillary Refill: Capillary refill takes less than 2 seconds.  Neurological:     General: No focal deficit present.     Mental Status: She is alert and oriented to person, place, and time.  Psychiatric:        Mood and Affect: Mood normal.        Behavior: Behavior normal.     ED Results / Procedures / Treatments   Labs (all labs ordered are listed, but only abnormal results are displayed) Labs Reviewed  COMPREHENSIVE METABOLIC PANEL - Abnormal; Notable for the following components:      Result Value   Glucose, Bld 190 (*)    All other components within normal limits  CBC - Abnormal; Notable for the following components:   WBC 10.6 (*)    All other components within normal limits  URINALYSIS, ROUTINE W REFLEX MICROSCOPIC - Abnormal; Notable for the following components:   APPearance HAZY (*)    Specific Gravity, Urine 1.035 (*)    Hgb urine dipstick TRACE (*)    Protein, ur 30 (*)    Leukocytes,Ua LARGE (*)    WBC, UA >50 (*)    Bacteria, UA RARE (*)    All other components within normal limits  RESP PANEL BY RT-PCR (RSV, FLU A&B, COVID)  RVPGX2  WET PREP, GENITAL  LIPASE, BLOOD  PREGNANCY, URINE  HIV ANTIBODY (ROUTINE TESTING W REFLEX)  GC/CHLAMYDIA PROBE AMP ([place]) NOT AT [facility]    EKG None  Radiology US PELVIC COMPLETE WITH TRANSVAGINAL  Result Date: 02/13/2022 CLINICAL DATA:  Pelvic pain EXAM: TRANSABDOMINAL AND TRANSVAGINAL ULTRASOUND OF PELVIS TECHNIQUE: Both transabdominal and transvaginal ultrasound examinations of the pelvis were performed. Transabdominal technique was performed for global imaging  of the pelvis including uterus, ovaries, adnexal regions, and pelvic cul-de-sac. It was necessary to proceed with endovaginal exam following the transabdominal exam to visualize the endometrium and ovaries. COMPARISON:  CT 8:38 p.m. FINDINGS: Uterus Measurements: 8.8 x 4.0 x 5.1 cm = volume: 94 mL. The  uterus is anteverted. The cervix is unremarkable. A small amount of minimally complex fluid is seen within the endometrial cavity measuring up to 3-4 mm in thickness, possibly related to the patient's phase of menstruation. No intrauterine masses are seen. Endometrium Thickness: 12 mm. Right ovary Measurements: 4.9 x 3.7 x 4.0 cm = volume: 39 mL. The right ovary is seen post lateral to the uterus and demonstrates multiple follicles including a dominant follicle measuring up to 26 mm. There is preserved color flow vascularity of the right ovary. There is thickening of the adjacent soft tissues within the right adnexa as well as a complex fluid-filled varicoid structure containing echogenic debris in keeping with a hematosalpinx or pyosalpinx. Small amount of free intraperitoneal fluid is also noted within the cul-de-sac and adjacent to the dilated right fallopian tube. Left ovary Not visualized.  No adnexal mass. Other findings As noted above, small free fluid is seen within the pelvis surrounding the thickened, fluid-filled right fallopian tube. IMPRESSION: 1. Findings in keeping with a hematosalpinx or pyosalpinx involving the right Fallopian tube. Small amount of free intraperitoneal fluid is seen within the pelvis and adjacent to the dilated right Fallopian tube. 2. Nonvisualization of the left ovary. No adnexal mass. 3. Small amount of minimally complex fluid within the endometrial cavity, possibly related to the patient's phase of menstruation. Electronically Signed   By: Helyn Numbers M.D.   On: 02/13/2022 23:23   CT ABDOMEN PELVIS W CONTRAST  Result Date: 02/13/2022 CLINICAL DATA:  Abdominal pain. EXAM:  CT ABDOMEN AND PELVIS WITH CONTRAST TECHNIQUE: Multidetector CT imaging of the abdomen and pelvis was performed using the standard protocol following bolus administration of intravenous contrast. RADIATION DOSE REDUCTION: This exam was performed according to the departmental dose-optimization program which includes automated exposure control, adjustment of the mA and/or kV according to patient size and/or use of iterative reconstruction technique. CONTRAST:  OMNIPAQUE IOHEXOL 350 MG/ML SOLN COMPARISON:  CT abdomen pelvis dated 01/16/2021. FINDINGS: Lower chest: The visualized lung bases are clear. No intra-abdominal free air.  Small free fluid within the pelvis. Hepatobiliary: Fatty liver. No biliary dilatation. The gallbladder is unremarkable. Pancreas: Unremarkable. No pancreatic ductal dilatation or surrounding inflammatory changes. Spleen: Normal in size without focal abnormality. Adrenals/Urinary Tract: The adrenal glands are unremarkable. There is no hydronephrosis on either side. Focal area of scarring in the mid to lower pole of the left kidney. The visualized ureters and urinary bladder appear unremarkable. Stomach/Bowel: There is no bowel obstruction or active inflammation. The appendix is normal. Vascular/Lymphatic: The abdominal aorta and IVC are unremarkable. No portal venous gas. There is no adenopathy. Reproductive: The uterus is anteverted and grossly unremarkable. Dilated fluid-filled tubular structure in the region of the right adnexa most consistent with hydrosalpinx. A 2.5 cm faint rounded hypodense structure in the right hemipelvis (coronal 44/5) may represent an ovarian cyst or follicle. An abscess is not excluded. Clinical correlation and further evaluation with pelvic ultrasound recommended. Other: Several nodular densities in the right breast measure up to 1.5 cm may represent intramammary lymph nodes. Correlation with clinical exam and mammographic studies, as clinically gated,  recommended. Musculoskeletal: No acute or significant osseous findings. IMPRESSION: 1. The constellation of findings may represent pelvic inflammatory disease. Correlation with clinical exam and further evaluation with pelvic ultrasound recommended. 2. No bowel obstruction. Normal appendix. 3. Fatty liver. 4. Several nodular densities in the right breast measure up to 1.5 cm may represent intramammary lymph nodes. Correlation with clinical exam and mammographic studies,  as clinically gated, recommended. Electronically Signed   By: Elgie Collard M.D.   On: 02/13/2022 20:46    Procedures Procedures    Medications Ordered in ED Medications  doxycycline (VIBRA-TABS) tablet 100 mg (has no administration in time range)  sodium chloride 0.9 % bolus 1,000 mL (0 mLs Intravenous Stopped 02/13/22 2159)  cefTRIAXone (ROCEPHIN) 1 g in sodium chloride 0.9 % 100 mL IVPB (0 g Intravenous Stopped 02/13/22 2159)  ketorolac (TORADOL) 30 MG/ML injection 30 mg (30 mg Intravenous Given 02/13/22 2058)  iohexol (OMNIPAQUE) 350 MG/ML injection 100 mL (100 mLs Intravenous Contrast Given 02/13/22 2028)  acetaminophen (TYLENOL) tablet 1,000 mg (1,000 mg Oral Given 02/13/22 2320)    ED Course/ Medical Decision Making/ A&P                           Medical Decision Making Amount and/or Complexity of Data Reviewed Labs: ordered. Radiology: ordered.  Risk OTC drugs. Prescription drug management.  This patient presents to the ED for concern of abd pain, this involves an extensive number of treatment options, and is a complaint that carries with it a high risk of complications and morbidity.  The differential diagnosis includes covid/flu/rsv, pna, uti, pyelo, pregnancy   Co morbidities that complicate the patient evaluation  htn, ovarian cyst, anxiety, depression, gerd, hld, and dm   Additional history obtained:  Additional history obtained from epic chart review    Lab Tests:  I Ordered, and personally  interpreted labs.  The pertinent results include:  cbc nl, covid/flu/rsv neg; cmp neg other than glucose elevated at 190; UA + UTI; preg neg   Imaging Studies ordered:  I ordered imaging studies including ct abd/pelvis; pelvic US  I independently visualized and interpreted imaging which showed  CT abd/pelvis: 1. The constellation of findings may represent pelvic inflammatory  disease. Correlation with clinical exam and further evaluation with  pelvic ultrasound recommended.  2. No bowel obstruction. Normal appendix.  3. Fatty liver.  4. Several nodular densities in the right breast measure up to 1.5  cm may represent intramammary lymph nodes. Correlation with clinical  exam and mammographic studies, as clinically gated, recommended.  Pelvic US: 1. Findings in keeping with a hematosalpinx or pyosalpinx involving  the right Fallopian tube. Small amount of free intraperitoneal fluid  is seen within the pelvis and adjacent to the dilated right  Fallopian tube.  2. Nonvisualization of the left ovary. No adnexal mass.  3. Small amount of minimally complex fluid within the endometrial  cavity, possibly related to the patient's phase of menstruation.   I agree with the radiologist interpretation   Cardiac Monitoring:  The patient was maintained on a cardiac monitor.  I personally viewed and interpreted the cardiac monitored which showed an underlying rhythm of: nsr   Medicines ordered and prescription drug management:  I ordered medication including rocephin and toradol  for pain  Reevaluation of the patient after these medicines showed that the patient improved I have reviewed the patients home medicines and have made adjustments as needed   Test Considered:  Ct/US   Critical Interventions:  abx   Consultations Obtained:  I requested consultation with the obgyn (Dr. Shawnie Pons),  and discussed lab and imaging findings as well as pertinent plan - she recommends: iv rocephin and  2 week doxy; f/u in office   Problem List / ED Course:  Abd pain:  pt did not reveal that she's had vaginal d/c  until after her CT.  She is sexually active.  Pt has a large amount of yellow d/c.  Vaginal d/c is likely contaminating urine.  Pt does have a pyosalpinx on US.  Pt d/w obgyn who recommend abx and f/u in the office.  Pt knows to return if worse.   Reevaluation:  After the interventions noted above, I reevaluated the patient and found that they have :improved   Social Determinants of Health:  Lives at home   Dispostion:  After consideration of the diagnostic results and the patients response to treatment, I feel that the patent would benefit from discharge with outpatient f/u.          Final Clinical Impression(s) / ED Diagnoses Final diagnoses:  Pyosalpinx  PID (acute pelvic inflammatory disease)    Rx / DC Orders ED Discharge Orders          Ordered    doxycycline (VIBRAMYCIN) 100 MG capsule  2 times daily        02/13/22 2339    HYDROcodone-acetaminophen (NORCO/VICODIN) 5-325 MG tablet  Every 4 hours PRN        02/13/22 2339    ibuprofen (ADVIL) 800 MG tablet  3 times daily        02/13/22 2339              Jacalyn LefevreHaviland, Delaine Canter, MD 02/13/22 2341

## 2022-02-13 NOTE — ED Triage Notes (Signed)
Patient here POV from Home.  Endorses Body Aches and RLQ ABD Pain that began approximately a few days ago.  Some nausea at Time. No Emesis or Diarrhea. No Cough or Congestion.   NAD Noted during Triage, A&Ox4. GCS 15. Ambulatory.

## 2022-02-13 NOTE — ED Notes (Signed)
Late entry -- pt OTF to CT dept via stretcher

## 2022-02-13 NOTE — ED Notes (Addendum)
Pt awake and alert lying in bed; GCS 15.  RR even and unlabored on RA with symmetrical rise and fall of chest.  Pt reports new onset HA with persistent generalized body aches with umbilical region abd pain and RLQ abd pain; pt also c/o nausea which she states is triggered by pain

## 2022-02-13 NOTE — ED Notes (Signed)
ED Provider at bedside. 

## 2022-02-13 NOTE — Progress Notes (Signed)
Patient ID: Misty Walsh, female   DOB: 1991-02-08, 31 y.o.   MRN: [id number]   OB/GYN Telephone Consult  02/13/2022   [name] is a 31 y.o. G1P0101 who is currently not pregnant presenting to [facility].   I was called for a consult regarding the care of this patient by the Physician (MD/DO) caring for the patient.   The provider had the following clinical question: Pyosalpinx treatment  The provider presented the following relevant clinical information: Mildly elevated WBC, no N/V., purulent discharge.  I performed a chart review on the patient and reviewed available documentation.  BP 117/79   Pulse 80   Temp 98.1 F (36.7 C) (Oral)   Resp 16   Ht 5\' 4"  (1.626 m)   Wt (!) 137 kg   SpO2 100%   BMI 51.84 kg/m   Exam- performed by consulting provider   Recommendations:  -rocephin 1gm IV -Doxycycline 100 mg bid x 14 days -f/u in office -Return with persistent N/V, uncontrollable pain, persistent fever -Recommended MD/APP provide the patient with a referral to the [facility] (any office) for follow up in  2 weeks.   Thank you for this consult and if additional recommendations are needed please call [phone] for the OB/GYN attending on service at [facility].   I spent approximately 10 minutes directly consulting with the provider and verbally discussing this case. Additionally 10 minutes minutes was spent performing chart review and documentation.    [facility], MD

## 2022-02-14 LAB — WET PREP, GENITAL
Clue Cells Wet Prep HPF POC: NONE SEEN
Sperm: NONE SEEN
Trich, Wet Prep: NONE SEEN
WBC, Wet Prep HPF POC: >=10 — AB (ref <10)
Yeast Wet Prep HPF POC: NONE SEEN

## 2022-02-14 LAB — HIV ANTIBODY (ROUTINE TESTING W REFLEX): HIV Screen 4th Generation wRfx: NONREACTIVE

## 2022-02-14 NOTE — ED Notes (Addendum)
Late entry -- Pt agreeable with d/c plan as discussed by provider- this nurse has verbally reinforced d/c instructions and provided pt with written copy.  Pt acknowledges verbal understanding and denies any addl questions, concerns, needs - ambulatory at d/c independently with steady gait

## 2022-02-15 LAB — GC/CHLAMYDIA PROBE AMP (~~LOC~~) NOT AT ARMC
Chlamydia: NEGATIVE
Comment: NEGATIVE
Comment: NORMAL
Neisseria Gonorrhea: NEGATIVE

## 2022-03-04 ENCOUNTER — Encounter: Payer: Medicaid Other | Admitting: Obstetrics & Gynecology

## 2022-03-13 ENCOUNTER — Institutional Professional Consult (permissible substitution): Payer: Medicaid Other | Admitting: Neurology

## 2022-03-20 ENCOUNTER — Other Ambulatory Visit (HOSPITAL_BASED_OUTPATIENT_CLINIC_OR_DEPARTMENT_OTHER): Payer: Self-pay

## 2022-03-20 ENCOUNTER — Ambulatory Visit (INDEPENDENT_AMBULATORY_CARE_PROVIDER_SITE_OTHER): Payer: Medicaid Other | Admitting: Obstetrics and Gynecology

## 2022-03-20 ENCOUNTER — Encounter: Payer: Self-pay | Admitting: Obstetrics and Gynecology

## 2022-03-20 VITALS — BP 160/112 | HR 91 | Ht 63.5 in | Wt 297.9 lb

## 2022-03-20 DIAGNOSIS — N9489 Other specified conditions associated with female genital organs and menstrual cycle: Secondary | ICD-10-CM | POA: Diagnosis not present

## 2022-03-20 DIAGNOSIS — R109 Unspecified abdominal pain: Secondary | ICD-10-CM

## 2022-03-20 DIAGNOSIS — G4700 Insomnia, unspecified: Secondary | ICD-10-CM

## 2022-03-20 MED ORDER — POLYETHYLENE GLYCOL 3350 17 G PO PACK: 17.0000 g | PACK | Freq: Two times a day (BID) | ORAL | 0 refills | Status: DC

## 2022-03-20 NOTE — Progress Notes (Unsigned)
02/13/22: ?right hemato vs pyosalpinx  Obstetrics and Gynecology New Patient Evaluation  Appointment Date: 03/20/2022  OBGYN Clinic: [facility]   Primary Care Provider: [place]  Chief Complaint: ED follow up   History of Present Illness: Misty Walsh is a 32 y.o. G1P0101 (Patient's last menstrual period was 02/28/2022 (exact date).), seen for the above chief complaint. Her past medical history is significant for BMI 50s, HTN, DM2, h/o classical c-section, h/o LEEP   Patient presented to the ED on 12/27 for generalized aches, chills; she states that flu ran through the house during the holidays.  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   Cellulitis 01/06/2013   History of cesarean section, classical 12/18/2012   Allergy 10/29/2012    {Common ambulatory SmartLinks:19316}  Past Medical History:  Past Medical History:  Diagnosis Date   Anxiety    Back pain    Chlamydia    Depression med made her navel itching and  made her sleepy so she quit taking them   Edema, lower extremity    Elevated cholesterol    Fibrocystic breast changes of both breasts    GERD (gastroesophageal reflux disease)    History of cesarean section, classical 12/18/2012   2014    Human papilloma virus    Hypertension    Joint pain    Moderate dysplasia of cervix    Numbness    right arm to hand   Ovarian cyst    Prediabetes     Past Surgical History:  Past  Surgical History:  Procedure Laterality Date   BREAST BIOPSY Left 2012   benign fibroadeoma   BREAST SURGERY     CESAREAN SECTION N/A 12/13/2012   Procedure: PRIMARY CESAREAN SECTION;  Surgeon: Frederico Hamman, MD;  Location: [facility];  Service: Obstetrics;  Laterality: N/A;   LEEP      Past Obstetrical History:  OB History  Gravida Para Term Preterm AB Living  1 1   1   1   SAB IAB Ectopic Multiple Live Births          1    # Outcome Date GA Lbr Len/2nd Weight Sex Delivery Anes PTL Lv  1 Preterm 12/13/12 [redacted]w[redacted]d / 00:59 1 lb 10.8 oz (0.76 kg) M CS-LTranv Spinal  LIV    SVD x ***, Cesarean section x ***  Past Gynecological History: As per HPI. Menarche age *** Periods: *** History of Pap Smear(s): Yes.   Last pap 01/2021, which was negative  She is currently using abstinence for contraception.   Social History:  Social History   Socioeconomic History   Marital status: Single    Spouse name: Not on file   Number of children: 1   Years of education: Not on file   Highest education level: 11th grade  Occupational History   Occupation: work at home    Comment: at home  Tobacco Use   Smoking status: Former    Packs/day: 0.25  Years: 5.00    Total pack years: 1.25    Types: Cigarettes    Quit date: 12/20/2019    Years since quitting: 2.2   Smokeless tobacco: Never  Vaping Use   Vaping Use: Never used  Substance and Sexual Activity   Alcohol use: Not Currently    Comment: occ   Drug use: No   Sexual activity: Not Currently    Birth control/protection: Condom  Other Topics Concern   Not on file  Social History Narrative   Lives with her child   Caffeine- green tea, Poweraid, grape juice   Social Determinants of Health   Financial Resource Strain: Not on file  Food Insecurity: No Food Insecurity (03/20/2022)   Hunger Vital Sign    Worried About Running Out of Food in the Last Year: Never true    Ran Out of Food in the Last Year: Never true  Transportation  Needs: Unmet Transportation Needs (03/20/2022)   PRAPARE - Hydrologist (Medical): Yes    Lack of Transportation (Non-Medical): Yes  Physical Activity: Not on file  Stress: Not on file  Social Connections: Not on file  Intimate Partner Violence: Not on file    Family History:  Family History  Problem Relation Age of Onset   Diabetes Mother    Heart disease Mother    Hypertension Mother    Depression Mother    Stroke Mother    Arthritis Mother    Stroke Father    High blood pressure Father    High Cholesterol Father    Depression Father    Anxiety disorder Father    Obesity Father    Asthma Sister    Kidney disease Brother        genetic condition   Diabetes Maternal Aunt    Hypertension Maternal Aunt    Asthma Maternal Aunt    Epilepsy Maternal Aunt    Hypertension Maternal Grandmother    Dementia Maternal Grandmother    Heart disease Maternal Grandmother    Diabetes Paternal Grandmother    She *** any female cancers, bleeding or blood clotting disorders.   Health Maintenance:  Mammogram(s): {YES PY:099833} Date: *** Colonoscopy: {YES AS:505397} Date: *** Flu shot UTD:  {YES/NO/NOT APPLICABLE:20182}  Medications Misty Walsh had no medications administered during this visit. Current Outpatient Medications  Medication Sig Dispense Refill   acetaminophen (TYLENOL) 325 MG tablet Take 650 mg by mouth every 6 (six) hours as needed for mild pain, fever or headache.     atorvastatin (LIPITOR) 80 MG tablet Take 80 mg by mouth daily.     busPIRone (BUSPAR) 15 MG tablet Take 15 mg by mouth 2 (two) times daily.     cetirizine (ZYRTEC) 10 MG tablet Take 10 mg by mouth daily.     fluticasone (FLONASE) 50 MCG/ACT nasal spray Place 1 spray into both nostrils daily. Begin by using 2 sprays in each nare daily for 3 to 5 days, then decrease to 1 spray in each nare daily. 32 mL 1   hydrochlorothiazide (HYDRODIURIL) 50 MG tablet Take by mouth.      ibuprofen (ADVIL) 400 MG tablet Take 1 tablet (400 mg total) by mouth every 8 (eight) hours as needed for up to 30 doses. (Patient taking differently: Take 200 mg by mouth every 8 (eight) hours as needed.) 30 tablet 0   polyethylene glycol (MIRALAX / GLYCOLAX) 17 g packet Take 17 g by mouth 2 (two) times daily. 14 each  0   sertraline (ZOLOFT) 25 MG tablet Take 25 mg by mouth daily.     doxycycline (VIBRAMYCIN) 100 MG capsule Take 1 capsule (100 mg total) by mouth 2 (two) times daily. (Patient not taking: Reported on 03/20/2022) 14 capsule 0   Fluocinolone Acetonide (DERMOTIC) 0.01 % OIL Place 5 drops in ear(s) 2 (two) times daily as needed (Itching in ears). (Patient not taking: Reported on 03/20/2022) 20 mL 0   ibuprofen (ADVIL) 800 MG tablet Take 1 tablet (800 mg total) by mouth 3 (three) times daily. (Patient not taking: Reported on 03/20/2022) 21 tablet 0   Insulin Pen Needle 31G X 6 MM MISC Use daily with Victoza (Patient not taking: Reported on 03/20/2022) 50 each 0   ondansetron (ZOFRAN) 4 MG tablet Take 1 tablet (4 mg total) by mouth every 8 (eight) hours as needed for nausea or vomiting. (Patient not taking: Reported on 03/20/2022) 12 tablet 0   VICTOZA 18 MG/3ML SOPN Inject into the skin. (Patient not taking: Reported on 03/20/2022)     No current facility-administered medications for this visit.    Allergies Macrobid [nitrofurantoin macrocrystal]   Physical Exam:  BP (!) 160/112   Pulse 91   Ht 5' 3.5" (1.613 m)   Wt 297 lb 14.4 oz (135.1 kg)   LMP 02/28/2022 (Exact Date) Comment: lasted 7 days; moderate-light bleeding typically  BMI 51.94 kg/m  Body mass index is 51.94 kg/m. Weight last year: *** General appearance: Well nourished, well developed female in no acute distress.  Neck:  Supple, normal appearance, and no thyromegaly  Cardiovascular: normal s1 and s2.  No murmurs, rubs or gallops. Respiratory:  Clear to auscultation bilateral. Normal respiratory effort Abdomen:  positive bowel sounds and no masses, hernias; diffusely non tender to palpation, non distended Breasts: {pe breast exam:315056::"breasts appear normal, no suspicious masses, no skin or nipple changes or axillary nodes"}. Neuro/Psych:  Normal mood and affect.  Skin:  Warm and dry.  Lymphatic:  No inguinal lymphadenopathy.   Pelvic exam: {ACTION; IS/IS JSH:70263785} limited by body habitus EGBUS: within normal limits Vagina: within normal limits and with {no/min/mod:60509} blood or discharge in the vault Cervix: normal appearing cervix without tenderness, discharge or lesions. IUD strings *** Uterus:  {Desc; uterus-size & shape:16618} and non tender Adnexa:  {exam; adnexa:12223} Rectovaginal: ***  Laboratory: ***  Radiology: ***  Assessment: ***  Plan: *** 1. Abdominal pain, unspecified abdominal location *** - Urine Culture - US PELVIC COMPLETE WITH TRANSVAGINAL; Future - Ambulatory referral to Gastroenterology  2. Adnexal mass ***  Orders Placed This Encounter  Procedures   Urine Culture   US PELVIC COMPLETE WITH TRANSVAGINAL   Ambulatory referral to Gastroenterology    RTC ***  Durene Romans MD Attending [facility] for [facility])

## 2022-03-23 LAB — URINE CULTURE

## 2022-03-25 MED ORDER — SULFAMETHOXAZOLE-TRIMETHOPRIM 800-160 MG PO TABS: 1.0000 | ORAL_TABLET | Freq: Two times a day (BID) | ORAL | 0 refills | Status: AC

## 2022-04-09 ENCOUNTER — Ambulatory Visit: Payer: Medicaid Other | Admitting: Neurology

## 2022-04-09 ENCOUNTER — Encounter: Payer: Self-pay | Admitting: Neurology

## 2022-04-09 ENCOUNTER — Encounter: Payer: Self-pay | Admitting: Gastroenterology

## 2022-04-09 VITALS — BP 144/95 | HR 72 | Ht 64.0 in | Wt 306.0 lb

## 2022-04-09 DIAGNOSIS — R519 Headache, unspecified: Secondary | ICD-10-CM | POA: Diagnosis not present

## 2022-04-09 DIAGNOSIS — E669 Obesity, unspecified: Secondary | ICD-10-CM | POA: Diagnosis not present

## 2022-04-09 DIAGNOSIS — R0683 Snoring: Secondary | ICD-10-CM | POA: Diagnosis not present

## 2022-04-09 DIAGNOSIS — F99 Mental disorder, not otherwise specified: Secondary | ICD-10-CM

## 2022-04-09 DIAGNOSIS — E662 Morbid (severe) obesity with alveolar hypoventilation: Secondary | ICD-10-CM | POA: Insufficient documentation

## 2022-04-09 DIAGNOSIS — G44019 Episodic cluster headache, not intractable: Secondary | ICD-10-CM | POA: Insufficient documentation

## 2022-04-09 DIAGNOSIS — F419 Anxiety disorder, unspecified: Secondary | ICD-10-CM

## 2022-04-09 DIAGNOSIS — F32A Depression, unspecified: Secondary | ICD-10-CM | POA: Insufficient documentation

## 2022-04-09 DIAGNOSIS — F5105 Insomnia due to other mental disorder: Secondary | ICD-10-CM | POA: Insufficient documentation

## 2022-04-09 MED ORDER — ALPRAZOLAM 0.5 MG PO TABS: 0.5000 mg | ORAL_TABLET | Freq: Every evening | ORAL | 0 refills | Status: DC | PRN

## 2022-04-09 MED ORDER — FLUTICASONE PROPIONATE 50 MCG/ACT NA SUSP: 1.0000 | Freq: Every day | NASAL | 1 refills | Status: DC

## 2022-04-09 NOTE — Progress Notes (Signed)
SLEEP MEDICINE CLINIC    Provider:  Larey Seat, MD  Primary Care Physician:  Jorge Ny, PA-C [address] [place] [address]     Referring Provider:  [facility]   Jearld Lesch, DO           Chief Complaint according to patient   Patient presents with:     New Patient (Initial Visit)     pt alone, rm 10. presents today due to her sleep struggles. the patient states that she will do everything she can and around 8:30/9 pm wind down to get ready for bed. she states most nights its 1-3 am before she is able to fall asleep. she wakes up around 7 am. she has never had a SS and does snore in sleep. She states that on average she goes to bed about  8.30 - but is asleep no earlier than 10/11 pm and may wake up 2-3 am and may be up 1-2 hrs and then she will fall back asleep and sleep til 6:30 am. Was seen as a slp eval 2021 and didn't complete SS. snores -Complains of still feeling extremely tired during the day       HISTORY OF PRESENT ILLNESS:  Misty Walsh is a 32 year old African American female patient seen here upon a revisit -  she has seen dr Leta Baptist in 2021, had consult and cervical spine MRI with him, not EMG nor NCV.   her last consultation was in 2021- from [employer], DO at the weight loss clinic., less than 3 years ago and she is therefor a Revisit -  She didn't follow up for sleep studies.   RV  on 04/09/2022    Chief concern according to patient : Snoring, Insomnia, delayed sleep onset. Help in weight loss.    Gillie Manners   has a past medical history of Anxiety, Back pain, Chlamydia, Depression (med made her navel itching and  made her sleepy so she quit taking them), Edema, lower extremity, Elevated cholesterol, Fibrocystic breast changes of both breasts, GERD (gastroesophageal reflux disease), History of cesarean section, classical (12/18/2012), Human papilloma virus, Hypertension, Joint pain, Moderate dysplasia of cervix,  Numbness, Ovarian cyst, and Prediabetes. Had Covid 19 -10-16-2019 after having had only one shot vaccine. She was treated Iv MAB, 10-20-2019.  Was having a second new covid infection 01-2020. None since.        Family medical /sleep history: maternal female cousine sleep paralysis and OSA.    Social history:  Patient is staying home to take care of her son, a special needs child , son is 73.72 years old.  Needs help dressing,  can feed himself with puree-  he has a feeding pump She does not have another job.  She lives in a household with son.  Tobacco use; quit 12-2019.   ETOH use -; socially, Caffeine intake in form of Coffee( 1-2 a day) Soda( recently quit /) Tea ( all day) or energy drinks.   Sleep habits are as follows:  The patient's dinner time is still  between 6-8 PM. The patient goes to bed at 8.30 PM and reads or prays- mind is still racing- eliminated screen time- - continues to feel anxious and it keeps her from sleeping, often until the morning  hours,   Son has nocturnal seizures- wakes for one bathroom break.   The preferred sleep position is prone or supine  with the support  of 3-4 pillows. Dreams are reportedly rare. 7-8  AM is the usual rise time. The patient wakes up with an alarm.  She reports not feeling refreshed or restored in AM, with symptoms such as dry mouth , morning headaches , and residual fatigue. Naps were taken frequently, and she reports an irresistible urge to sleep once her sone is home form  school, but she can't nap while he is at school.  Most naps lasting 30 minutes now.  and are less refreshing than nocturnal sleep.   Primary Neurologist was Dr Leta Baptist ,  09-14-2019: notes : 32 year old female here for evaluation of numbness and pain. Patient reports bilateral upper extremity numbness, muscle pain, weakness, fluctuating and intermittent since 2020.  She also has some numbness and tingling in her left greater than right leg.  No triggering or aggravating  factors.  No accidents injuries or traumas.      Review of Systems: Out of a complete 14 system review, the patient complains of only the following symptoms, and all other reviewed systems are negative.:  Fatigue, sleepiness , snoring, fragmented sleep, Insomnia - delayed sleep phase.  Morning and sleep headaches, possible  hypoventilation .  Anxiety- hypervigilance, OHV   Waking up with headaches and headaches are waking her up as well. Nocturia 1 time.   Not a dreamer.   Trazodone helps fall asleep not stay asleep.   Body ache and fatigue from COVID have improved.   BMI 52.    How likely are you to doze in the following situations: 0 = not likely, 1 = slight chance, 2 = moderate chance, 3 = high chance   Sitting and Reading? 1 Watching Television? 2 Sitting inactive in a public place (theater or meeting)?1-2 As a passenger in a car for an hour without a break?0 Lying down in the afternoon when circumstances permit? 3 Sitting and talking to someone?1 Sitting quietly after lunch without alcohol?3 In a car, while stopped for a few minutes in traffic?0   Total = 8 / 24 points , was 11 in 2021.   FSS endorsed at  42 up from 40/ 63 in 2021.  Marland Kitchen   Social History   Socioeconomic History   Marital status: Single    Spouse name: Not on file   Number of children: 1   Years of education: Not on file   Highest education level: 11th grade  Occupational History   Occupation: work at home    Comment: at home  Tobacco Use   Smoking status: Former    Packs/day: 0.25    Years: 5.00    Total pack years: 1.25    Types: Cigarettes    Quit date: 12/20/2019    Years since quitting: 2.3   Smokeless tobacco: Never  Vaping Use   Vaping Use: Never used  Substance and Sexual Activity   Alcohol use: Not Currently    Comment: occ   Drug use: No   Sexual activity: Not Currently    Birth control/protection: Condom  Other Topics Concern   Not on file  Social History Narrative   Lives  with her child   Caffeine- green tea, Poweraid, grape juice   Social Determinants of Health   Financial Resource Strain: Not on file  Food Insecurity: No Food Insecurity (03/20/2022)   Hunger Vital Sign    Worried About Running Out of Food in the Last Year: Never true    [place] in the Last Year: Never true  Transportation Needs: Unmet Transportation Needs (03/20/2022)   PRAPARE - Hydrologist (Medical): Yes    Lack of Transportation (Non-Medical): Yes  Physical Activity: Not on file  Stress: Not on file  Social Connections: Not on file    Family History  Problem Relation Age of Onset   Diabetes Mother    Heart disease Mother    Hypertension Mother    Depression Mother    Stroke Mother    Arthritis Mother    Stroke Father    High blood pressure Father    High Cholesterol Father    Depression Father    Anxiety disorder Father    Obesity Father    Asthma Sister    Kidney disease Brother        genetic condition   Diabetes Maternal Aunt    Hypertension Maternal Aunt    Asthma Maternal Aunt    Epilepsy Maternal Aunt    Hypertension Maternal Grandmother    Dementia Maternal Grandmother    Heart disease Maternal Grandmother    Diabetes Paternal Grandmother     Past Medical History:  Diagnosis Date   Anxiety    Back pain    Chlamydia    Depression med made her navel itching and  made her sleepy so she quit taking them   Edema, lower extremity    Elevated cholesterol    Fibrocystic breast changes of both breasts    GERD (gastroesophageal reflux disease)    History of cesarean section, classical 12/18/2012   2014    Human papilloma virus    Hypertension    Joint pain    Moderate dysplasia of cervix    Numbness    right arm to hand   Ovarian cyst    Prediabetes     Past Surgical History:  Procedure Laterality Date   BREAST BIOPSY Left 2012   benign fibroadeoma   BREAST SURGERY     CESAREAN SECTION N/A 12/13/2012    Procedure: PRIMARY CESAREAN SECTION;  Surgeon: Frederico Hamman, MD;  Location: [facility];  Service: Obstetrics;  Laterality: N/A;   LEEP       Current Outpatient Medications on File Prior to Visit  Medication Sig Dispense Refill   acetaminophen (TYLENOL) 325 MG tablet Take 650 mg by mouth every 6 (six) hours as needed for mild pain, fever or headache.     atorvastatin (LIPITOR) 80 MG tablet Take 80 mg by mouth daily.     busPIRone (BUSPAR) 15 MG tablet Take 15 mg by mouth 2 (two) times daily.     cetirizine (ZYRTEC) 10 MG tablet Take 10 mg by mouth daily.     fluticasone (FLONASE) 50 MCG/ACT nasal spray Place 1 spray into both nostrils daily. Begin by using 2 sprays in each nare daily for 3 to 5 days, then decrease to 1 spray in each nare daily. 32 mL 1   hydrochlorothiazide (HYDRODIURIL) 50 MG tablet Take 50 mg by mouth daily.     polyethylene glycol (MIRALAX / GLYCOLAX) 17 g packet Take 17 g by mouth 2 (two) times daily. (Patient taking differently: Take 17 g by mouth daily as needed for moderate constipation.) 14 each 0   sertraline (ZOLOFT) 25 MG tablet Take 25 mg by mouth daily.     No current facility-administered medications on file prior to visit.    Allergies  Allergen Reactions   Macrobid [Nitrofurantoin Macrocrystal] Itching    Caused the patient to feel hot.  Physical exam:  Today's Vitals   04/09/22 0949  BP: (!) 144/95  Pulse: 72  Weight: (!) 306 lb (138.8 kg)  Height: 5' 4"$  (1.626 m)   Body mass index is 52.52 kg/m.   Wt Readings from Last 3 Encounters:  04/09/22 (!) 306 lb (138.8 kg)  03/20/22 297 lb 14.4 oz (135.1 kg)  02/13/22 (!) 302 lb 0.5 oz (137 kg)     Ht Readings from Last 3 Encounters:  04/09/22 5' 4"$  (1.626 m)  03/20/22 5' 3.5" (1.613 m)  02/13/22 5' 4"$  (1.626 m)      General: The patient is awake, alert and appears not in acute distress. The patient is well groomed. Head: Normocephalic, atraumatic. Neck is supple. Mallampati 3,  neck  circumference:19 inches . Nasal airflow patent.   Retrognathia is seen.  Dental status: intact , TMJ click bilaterally, popping.  Cardiovascular:  Regular rate and cardiac rhythm by pulse,  without distended neck veins. Respiratory: Lungs are clear to auscultation.  Skin:  With evidence of ankle edema.. Trunk: The patient's posture is erect.   Neurologic exam : The patient is awake and alert, oriented to place and time.   Memory subjective described as intact.  Attention span & concentration ability appears normal.  Speech is fluent,  without  dysarthria, dysphonia or aphasia.  Mood and affect are appropriate.   Cranial nerves: no loss of smell or taste reported  Pupils are equal and briskly reactive to light. Funduscopic exam deferred.   Extraocular movements in vertical and horizontal planes were intact and without nystagmus. No Diplopia. Visual fields by finger perimetry are intact.  Hearing was intact to soft voice and finger rubbing. Facial sensation intact to fine touch.  Facial motor strength is symmetric and tongue is  midline.  Neck ROM : rotation, tilt and flexion extension were normal for age and shoulder shrug was symmetrical.    Motor exam:  Symmetric bulk, tone and ROM.   Normal tone without cog wheeling, symmetrically reduced  grip strength .  Sensory:  Fine touch and vibration were normal. Hands go numb - carpal tunnel ?  Proprioception tested in the upper extremities was normal.  Coordination: Rapid alternating movements in the fingers/hands were of normal speed.  The Finger-to-nose maneuver was intact without evidence of ataxia, dysmetria or tremor.  Gait and station: Patient could rise unassisted from a seated position, walked without assistive device.  Stance is of  wider base .  Toe and heel walk were deferred.  Deep tendon reflexes: in the  upper and lower extremities are symmetric and intact.  Babinski response was deferred .       After spending a total  time of 30 minutes face to face and additional time for physical and neurologic examination, review of laboratory studies,  personal review of imaging studies, reports and results of other testing and review of referral information / records as far as provided in visit, I have established the following assessments:  1) OSA and OHVMrs. Bobier has definitely risk factors for the presence of obstructive sleep apnea:  this is related to her metabolic syndrome.  she is not hypertensive today but she does have a history of documented prediabetes, and her BMI is 52-53 kg/m .  in order to reduce her BMI her weight loss specialists have recommended a sleep study also to help her improve her overall sleep duration.  She knows that she is snoring but she has never woken up choking or feeling short  of air.  Even when she contracted Covid she did not have necessarily shortness of breath.   2) Morning headaches and headaches waking her out of sleep, pounding, severe neck pain and nauseating.  3) Insomnia. 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  He was born at [redacted] weeks gestation.  She is highly anxious and hypervigilant.  She is hypervigilant at night also because he developed nocturnal seizures.  It is a concern about her son that really does not let her drift into a deep sleep.    Even a sleep aid such as trazodone and buspar would not necessarily be a good cure or onset for this part of her condition.  It helps her to fall asleep, not to stay asleep. she failed melatonin- trazodone 25 mg po was helping but was exchanged for buspar-     My Plan is to proceed with:  1) HST or PSG to screen for hypoventilation, hypoxemia and apnea- Sleep headches, and snoring, rule out OHV and OSA. 2) depression and anxiety at the origin for her insomnia,  delayed sleep phase.  PCP is following this.    I would like to thank PA Jorge Ny  for allowing me to meet  again with this pleasant patient. [facility].    I plan to follow up either personally or through our NP within 3-6 months.      Electronically signed by: Larey Seat, MD 04/09/2022 10:26 AM  [facility] and [facility] Board certified by The [employer] of Sleep Medicine and Diplomate of the [employer] of Sleep Medicine. Board certified In Neurology through the [place], Fellow of the [employer] of Neurology. Medical Director of [employer].

## 2022-04-09 NOTE — Patient Instructions (Addendum)
Dear Mr. Misty Walsh, I ordered you to 2 tablets of Xanax 0.5 mg in order to assure that you can get some sleep during your sleep study.  I like for you to bring your BuSpar medication with you to the sleep lab and we are trying to make the diagnosis of sleep apnea within the first 2 hours of the sleep study in order to have time to treat you in the second half of the night.  Your arrival time should be 8 PM at the latest 9 PM.  The sleep study I ordered is a split night polysomnography.  If your insurance does not permit a split-night, we will have to settle for either a baseline polysomnography or a home sleep test.  These tests are for diagnosis of sleep apnea but do not offer time to treat, meaning that any CPAP trial etc. would be an extra step.    Screening for Sleep Apnea  Sleep apnea is a condition in which breathing pauses or becomes shallow during sleep. Sleep apnea screening is a test to determine if you are at risk for sleep apnea. The test includes a series of questions. It will only takes a few minutes. Your health care provider may ask you to have this test in preparation for surgery or as part of a physical exam. What are the symptoms of sleep apnea? Common symptoms of sleep apnea include: Snoring. Waking up often at night. Daytime sleepiness. Pauses in breathing. Choking or gasping during sleep. Irritability. Forgetfulness. Trouble thinking clearly. Depression. Personality changes. Most people with sleep apnea do not know that they have it. What are the advantages of sleep apnea screening? Getting screened for sleep apnea can help: Ensure your safety. It is important for your health care providers to know whether or not you have sleep apnea, especially if you are having surgery or have other long-term (chronic) health conditions. Improve your health and allow you to get a better night's rest. Restful sleep can help you: Have more energy. Lose weight. Improve high blood  pressure. Improve diabetes management. Prevent stroke. Prevent car accidents. What happens during the screening? Screening usually includes being asked a list of questions about your sleep quality. Some questions you may be asked include: Do you snore? Is your sleep restless? Do you have daytime sleepiness? Has a partner or spouse told you that you stop breathing during sleep? Have you had trouble concentrating or memory loss? What is your age? What is your neck circumference? To measure your neck, keep your back straight and gently wrap the tape measure around your neck. Put the tape measure at the middle of your neck, between your chin and collarbone. What is your sex assigned at birth? Do you have or are you being treated for high blood pressure? If your screening test is positive, you are at risk for the condition. Further testing may be needed to confirm a diagnosis of sleep apnea. Where to find more information You can find screening tools online or at your health care clinic. For more information about sleep apnea screening and healthy sleep, visit these websites: Centers for Disease Control and Prevention: http://www.wolf.info/ American Sleep Apnea Association: www.sleepapnea.org Contact a health care provider if: You think that you may have sleep apnea. Summary Sleep apnea screening can help determine if you are at risk for sleep apnea. It is important for your health care providers to know whether or not you have sleep apnea, especially if you are having surgery or have other chronic  health conditions. You may be asked to take a screening test for sleep apnea in preparation for surgery or as part of a physical exam. This information is not intended to replace advice given to you by your health care provider. Make sure you discuss any questions you have with your health care provider. Document Revised: 01/14/2020 Document Reviewed: 01/14/2020 Elsevier Patient Education  [place]. Obesity-Hypoventilation Syndrome Obesity-hypoventilation syndrome (OHS) is a condition in which a person cannot efficiently move air in and out of the lungs (ventilate). This condition causes a buildup of carbon dioxidelevels in the blood and a drop in oxygen levels. OHS can increase the risk for: Cor pulmonale, or right-sided heart failure. Left-sided heart failure. Pulmonary hypertension, or high blood pressure in the arteries in the lungs. Too many red blood cells in the body. Disability or death. What are the causes? This condition may be caused by: Being obese with a BMI (body mass index) greater than or equal to 30 kg/m2. Having too much fat around the abdomen, chest, and neck. The brain being unable to properly manage the high carbon dioxide and low oxygen levels. Hormones made by fat cells. These hormones may interfere with breathing. Sleep apnea. This is when breathing stops, pauses, or is shallow during sleep. What are the signs or symptoms? Symptoms of this condition include: Feeling sleepy during the day. Headaches. These may be worse in the morning. Shortness of breath. Snoring, choking, gasping, or trouble breathing during sleep. Poor concentration or poor memory. Mood changes or feeling irritable. Depression. How is this diagnosed? This condition may be diagnosed by: BMI measurement. Blood tests to measure blood levels of serum bicarbonate, carbon dioxide, and oxygen. Pulse oximetry to measure the amount of oxygen in your blood. This uses a small device that is placed on your finger, earlobe, or toe. Polysomnogram, or sleep study, to check your breathing patterns and levels of oxygen and carbon dioxide while you sleep. You may also have other tests, including: A chest X-ray to rule out other breathing problems. Lung tests, or pulmonary function tests, to rule out other breathing problems. Electrocardiogram (ECG) or echocardiogram to check for signs of heart  failure. How is this treated? This condition may be treated with: A device such as a continuous positive airway pressure (CPAP) machine or a bi-level positive airway pressure (BIPAP) machine. These devices deliver pressure and sometimes oxygen to make breathing easier. A mask may be placed over your nose or mouth. Oxygen if your blood oxygen levels are low. A weight-loss program. Bariatric, or weight-loss, surgery. Tracheostomy. A tube is placed in the windpipe through the neck to help with breathing. Follow these instructions at home:  Medicines Take over-the-counter and prescription medicines only as told by your health care provider. Ask your health care provider what medicines are safe for you. You may be told to avoid medicines such as sedatives and narcotics. These can affect breathing and make OHS worse. Sleeping habits If you are prescribed a CPAP or a BIPAP machine, make sure you understand how to use it. Use your CPAP or BIPAP machine only as told by your health care provider. Try to get at least 8 hours of sleep every night. Eating and drinking  Eat foods that are high in fiber, such as beans, whole grains, and fresh fruits and vegetables. Limit foods that are high in fat and processed sugars, such as fried or sweet foods. Drink enough fluid to keep your urine pale yellow. Do not drink  alcohol if: Your health care provider tells you not to drink. You are pregnant, may be pregnant, or are planning to become pregnant. General instructions Follow a diet and exercise plan that helps you reach and keep a healthy weight as told by your health care provider. Exercise regularly as told by your health care provider. Do not use any products that contain nicotine or tobacco. These products include cigarettes, chewing tobacco, and vaping devices, such as e-cigarettes. If you need help quitting, ask your health care provider. Keep all follow-up visits. This is important. Contact a health  care provider if: You develop new or worsening shortness of breath. You are having trouble waking up or staying awake. You are confused. You develop a cough. You have a fever. Get help right away if: You have chest pain. You have fast or irregular heartbeats. You are dizzy or you faint. You have any symptoms of a stroke. "BE FAST" is an easy way to remember the main warning signs of a stroke: B - Balance. Signs are dizziness, sudden trouble walking, or loss of balance. E - Eyes. Signs are trouble seeing or a sudden change in vision. F - Face. Signs are sudden weakness or numbness of the face, or the face or eyelid drooping on one side. A - Arms. Signs are weakness or numbness in an arm. This happens suddenly and usually on one side of the body. S - Speech. Signs are sudden trouble speaking, slurred speech, or trouble understanding what people say. T - Time. Time to call emergency services. Write down what time symptoms started. You have other signs of a stroke, such as: A sudden, severe headache with no known cause. Nausea or vomiting. Seizure. These symptoms may be an emergency. Get help right away. Call 911. Do not wait to see if the symptoms will go away. Do not drive yourself to the hospital. Summary Obesity-hypoventilation syndrome (OHS) causes a buildup of carbon dioxidelevels in the blood and a drop in oxygen levels. OHS can increase the risk for heart failure, pulmonary hypertension, disability, and death. Follow your diet and exercise plan as told by your health care provider. Get help right away if you have any symptoms of a stroke. This information is not intended to replace advice given to you by your health care provider. Make sure you discuss any questions you have with your health care provider. Document Revised: 09/12/2020 Document Reviewed: 09/12/2020 Elsevier Patient Education  [place].

## 2022-04-26 ENCOUNTER — Telehealth: Payer: Self-pay | Admitting: Neurology

## 2022-04-26 NOTE — Telephone Encounter (Signed)
[facility] [employer] pending uploaded notes on the portal  

## 2022-04-29 NOTE — Telephone Encounter (Signed)
Checked status on the portal it is still pending.  

## 2022-05-07 ENCOUNTER — Ambulatory Visit
Admission: RE | Admit: 2022-05-07 | Discharge: 2022-05-07 | Disposition: A | Discharge status: Home / Self Care | Payer: Medicaid Other | Source: Ambulatory Visit | Attending: Obstetrics and Gynecology | Admitting: Obstetrics and Gynecology

## 2022-05-07 DIAGNOSIS — R109 Unspecified abdominal pain: Secondary | ICD-10-CM | POA: Diagnosis not present

## 2022-05-14 ENCOUNTER — Ambulatory Visit: Payer: Medicaid Other | Admitting: Gastroenterology

## 2022-05-16 NOTE — Telephone Encounter (Signed)
05/15/22 patient getting new ins early May, will call back  05/01/22 [facility] auth: [id number] (Exp. 04/26/22 to 07/29/22) EE

## 2022-05-18 ENCOUNTER — Encounter: Payer: Self-pay | Admitting: Obstetrics and Gynecology

## 2022-06-05 ENCOUNTER — Ambulatory Visit (INDEPENDENT_AMBULATORY_CARE_PROVIDER_SITE_OTHER): Payer: Medicaid Other | Admitting: Obstetrics and Gynecology

## 2022-06-05 ENCOUNTER — Other Ambulatory Visit: Payer: Self-pay

## 2022-06-05 ENCOUNTER — Encounter: Payer: Self-pay | Admitting: Obstetrics and Gynecology

## 2022-06-05 VITALS — BP 154/133 | HR 98 | Ht 64.0 in | Wt 299.3 lb

## 2022-06-05 DIAGNOSIS — N83201 Unspecified ovarian cyst, right side: Secondary | ICD-10-CM

## 2022-06-05 DIAGNOSIS — N7011 Chronic salpingitis: Secondary | ICD-10-CM

## 2022-06-05 DIAGNOSIS — Z87440 Personal history of urinary (tract) infections: Secondary | ICD-10-CM

## 2022-06-05 NOTE — Progress Notes (Signed)
Obstetrics and Gynecology New Patient Evaluation  Appointment Date: 06/05/2022  OBGYN Clinic: [facility]   Primary Care Provider: Quita Skye [facility])  Referring Provider: Quita Skye, PA-C  Chief Complaint: ultrasound follow up   History of Present Illness: Misty Walsh is a 32 y.o. G1P0101 (Patient's last menstrual period was 05/16/2022 (exact date).), seen for the above chief complaint. Her past medical history is significant for BMI 50s, DM2, HTN, h/o classical c-section and h/o LEEP, h/o STDs  Patient presented to the ED on 12/27 for generalized aches, chills; she states that flu ran through the house during the holidays.  She also endorsed low belly abdominal pain going on for over a year so a CT and u/s was ordered. CT showed dilated fluid filled tubular structure in the right adnexa c/w a hydro. U/s showed findings c/w hemato vs pyosalpinx. She had a CT renal stone study in November 2022 with negative findings, and she states s/s were ongoing at that time.    GYN Dr. Shawnie Pons consulted and the patient received Rocephin and was discharged to home on PO doxycycline, which she confirms she took and finished.   I saw her for follow up on 1/31 and I did not feel that her abdominal pain was what I was seeing on imaging and I recommended a repeat early cycle u/s in 2-3 months and to get a UCx. I also placed a GI referral.   Interval History: UCx 25-50k e.coli and she states she took the abx. Patient no show'ed to GI visit. She is also getting set up for a sleep study. Ultrasound shows stable right sided hydrosalpinx and new 4cm complex RO lesion, likely a hemorrhagic cyst, small and non specific free fluid and still normal uterus; LO not seen  Patient states that she has right sided abdominal discomfort and right low back pain and can be worse with her periods.   Review of Systems: Pertinent items noted in HPI and remainder of comprehensive ROS  otherwise negative.   Patient Active Problem List   Diagnosis Date Noted   Cyst of right ovary 06/05/2022   Episodic cluster headache, not intractable 04/09/2022   Hypoventilation associated with obesity syndrome 04/09/2022   Sleep-related headache 04/09/2022   Loud snoring 04/09/2022   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/2022   Anxiety and depression 04/09/2022   Adnexal mass 03/20/2022   Symptomatic mammary hypertrophy 12/07/2019   Back pain 12/07/2019   Neck pain 12/07/2019   Super obesity 11/09/2019   Prediabetes 11/04/2019   Elevated cholesterol 11/04/2019   Vitamin D deficiency 11/04/2019   Hemorrhagic ovarian cyst 06/21/2017   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   Cellulitis 01/06/2013   History of cesarean section, classical 12/18/2012   Allergy 10/29/2012    Past Medical History:  Past Medical History:  Diagnosis Date   Anxiety    Back pain    Chlamydia    Depression med made her navel itching and  made her sleepy so she quit taking them   Edema, lower extremity    Elevated cholesterol    Fibrocystic breast changes of both breasts    GERD (gastroesophageal reflux disease)    History of cesarean section, classical 12/18/2012   2014    Human papilloma virus    Hypertension    Joint pain    Moderate dysplasia of cervix    Numbness    right arm to hand   Ovarian cyst  Prediabetes     Past Surgical History:  Past Surgical History:  Procedure Laterality Date   BREAST BIOPSY Left 2012   benign fibroadeoma   BREAST SURGERY     CESAREAN SECTION N/A 12/13/2012   Procedure: PRIMARY CESAREAN SECTION;  Surgeon: Kathreen Cosier, MD;  Location: [facility];  Service: Obstetrics;  Laterality: N/A;   LEEP      Past Obstetrical History:  OB History  Gravida Para Term Preterm AB Living  SAB IAB Ectopic Multiple Live Births          1    # Outcome Date GA Lbr Len/2nd Weight Sex Delivery Anes PTL Lv  1 Preterm 12/13/12 [redacted]w[redacted]d /  00:59 1 lb 10.8 oz (0.76 kg) M CS-LTranv Spinal  LIV    Past Gynecological History: As per HPI. Periods: qmonth, regular, approximately one week and not heavy or painful  History of Pap Smear(s): Yes.   Last pap 01/2021, which was negative  She is currently using abstinence for contraception.   Social History:  Social History   Socioeconomic History   Marital status: Single    Spouse name: Not on file   Number of children: 1   Years of education: Not on file   Highest education level: 11th grade  Occupational History   Occupation: work at home    Comment: at home  Tobacco Use   Smoking status: Former    Packs/day: 0.25    Years: 5.00    Additional pack years: 0.00    Total pack years: 1.25    Types: Cigarettes    Quit date: 12/20/2019    Years since quitting: 2.4   Smokeless tobacco: Never  Vaping Use   Vaping Use: Never used  Substance and Sexual Activity   Alcohol use: Not Currently    Comment: occ   Drug use: No   Sexual activity: Not Currently    Birth control/protection: Condom  Other Topics Concern   Not on file  Social History Narrative   Lives with her child   Caffeine- green tea, Poweraid, grape juice   Social Determinants of Health   Financial Resource Strain: Not on file  Food Insecurity: No Food Insecurity (03/20/2022)   Hunger Vital Sign    Worried About Running Out of Food in the Last Year: Never true    Ran Out of Food in the Last Year: Never true  Transportation Needs: Unmet Transportation Needs (03/20/2022)   PRAPARE - Administrator, Civil Service (Medical): Yes    Lack of Transportation (Non-Medical): Yes  Physical Activity: Not on file  Stress: Not on file  Social Connections: Not on file  Intimate Partner Violence: Not on file    Family History:  Family History  Problem Relation Age of Onset   Diabetes Mother    Heart disease Mother    Hypertension Mother    Depression Mother    Stroke Mother    Arthritis Mother     Stroke Father    High blood pressure Father    High Cholesterol Father    Depression Father    Anxiety disorder Father    Obesity Father    Asthma Sister    Kidney disease Brother        genetic condition   Diabetes Maternal Aunt    Hypertension Maternal Aunt    Asthma Maternal Aunt    Epilepsy Maternal Aunt    Hypertension Maternal  Grandmother    Dementia Maternal Grandmother    Heart disease Maternal Grandmother    Diabetes Paternal Grandmother     Medications Misty Walsh had no medications administered during this visit. Current Outpatient Medications  Medication Sig Dispense Refill   acetaminophen (TYLENOL) 325 MG tablet Take 650 mg by mouth every 6 (six) hours as needed for mild pain, fever or headache.     atorvastatin (LIPITOR) 80 MG tablet Take 80 mg by mouth daily.     busPIRone (BUSPAR) 15 MG tablet Take 15 mg by mouth 2 (two) times daily.     cetirizine (ZYRTEC) 10 MG tablet Take 10 mg by mouth daily.     fluticasone (FLONASE) 50 MCG/ACT nasal spray Place 1 spray into both nostrils daily. Begin by using 2 sprays in each nare daily for 3 to 5 days, then decrease to 1 spray in each nare daily. 32 mL 1   hydrochlorothiazide (HYDRODIURIL) 50 MG tablet Take 50 mg by mouth daily.     polyethylene glycol (MIRALAX / GLYCOLAX) 17 g packet Take 17 g by mouth 2 (two) times daily. (Patient taking differently: Take 17 g by mouth daily as needed for moderate constipation.) 14 each 0   sertraline (ZOLOFT) 25 MG tablet Take 25 mg by mouth daily.     ALPRAZolam (XANAX) 0.5 MG tablet Take 1 tablet (0.5 mg total) by mouth at bedtime as needed for anxiety. (Patient not taking: Reported on 06/05/2022) 2 tablet 0   No current facility-administered medications for this visit.    Allergies Macrobid [nitrofurantoin macrocrystal]   Physical Exam:  BP (!) 154/133   Pulse 98   Ht  (1.626 m)   Wt 299 lb 4.8 oz (135.8 kg)   LMP 05/16/2022 (Exact Date) Comment: lasted until 05/21/22   BMI 51.37 kg/m  Body mass index is 51.37 kg/m. General appearance: Well nourished, well developed female in no acute distress.  Respiratory: Normal respiratory effort Abdomen: obese, soft, nttp, nd Neuro/Psych:  Normal mood and affect.  Skin:  Warm and dry.   Laboratory: none  Radiology:  Narrative & Impression  CLINICAL DATA:  Follow-up possible RIGHT hydrosalpinx, prior abnormal ultrasound, LMP 04/19/2022   EXAM: TRANSABDOMINAL AND TRANSVAGINAL ULTRASOUND OF PELVIS   TECHNIQUE: Both transabdominal and transvaginal ultrasound examinations of the pelvis were performed. Transabdominal technique was performed for global imaging of the pelvis including uterus, ovaries, adnexal regions, and pelvic cul-de-sac. It was necessary to proceed with endovaginal exam following the transabdominal exam to visualize the endometrium, ovaries, and adnexa.   COMPARISON:  02/13/2022   FINDINGS: Uterus   Measurements: 8.1 x 4.3 x 5.8 cm = volume: 105 mL. Anteverted. Normal morphology without mass   Endometrium   Thickness: 10 mm.  No endometrial fluid or mass   Right ovary   Measurements: 7.0 x 5.0 x 5.8 cm = volume: 106 mL. Dominant simple benign physiologic follicle 3.0 cm diameter; no follow-up imaging recommended. Additional complex lesion within RIGHT ovary 3.9 x 3.9 x 4.8 cm, containing hyperechoic and hypoechoic scattered foci and absent internal blood flow on color Doppler imaging. Finding likely represents a hemorrhagic cyst. Additional small resolving corpus luteum.   Left ovary   Not visualized, likely obscured by bowel   Other findings   Free fluid. Tubular appearing fluid collection in RIGHT adnexa adjacent to RIGHT ovary consistent with hydrosalpinx. No additional masses.   IMPRESSION: Small amount of nonspecific free pelvic fluid and RIGHT hydrosalpinx.   Unremarkable uterus and endometrial  complex with nonvisualization of LEFT ovary.   New complicated  lesion within RIGHT ovary 4.8 cm diameter, heterogeneous, avascular, favor hemorrhagic cyst; Short-interval follow up ultrasound in 6-12 weeks is recommended, preferably during the week following the patient's normal menses.     Electronically Signed   By: Ulyses Southward M.D.   On: 05/07/2022 10:27   Assessment: patient stable  Plan:  1. Hydrosalpinx I told her that the likely right sided hydro looks stable and I recommend a f/u early cycle u/s in about 2 months to follow up the likely hemorrhagic ovarian cyst as her. I told her that any surgery to remove the tube would be difficult due to her size and because there is likely scar tissue. I also told her that given her co-morbidities she would be at risk for need for open procedure, injury to surrounding structures, infection, bleeding.    I told her that she needs to get better optimized for surgery before it can be considered. I told her that we will get a an A1c to see how her DM2 is controlled and because her BP is elevated. Also, getting her sleep study done.   She states that she does not want anymore children and wants a BTL. R/B/A d/w her and she desires this. She will need to sign BTL papers at some point. I told her would do a bilateral salpingectomy  2. Cyst of right ovary See above.  - US PELVIC COMPLETE WITH TRANSVAGINAL; Future  3. History of UTI - Hemoglobin A1c - Urine Culture  Orders Placed This Encounter  Procedures   Urine Culture   US PELVIC COMPLETE WITH TRANSVAGINAL   Hemoglobin A1c    RTC after her ultrasound.   No follow-ups on file.  Future Appointments  Date Time Provider Department Center  08/14/2022  1:30 PM [facility] [place]    Cornelia Copa MD Attending Center for [facility])

## 2022-06-06 LAB — HEMOGLOBIN A1C
Est. average glucose Bld gHb Est-mCnc: 137 mg/dL
Hgb A1c MFr Bld: 6.4 % — ABNORMAL HIGH (ref 4.8–5.6)

## 2022-06-07 LAB — URINE CULTURE

## 2022-08-14 ENCOUNTER — Ambulatory Visit (HOSPITAL_COMMUNITY)
Admission: RE | Admit: 2022-08-14 | Discharge: 2022-08-14 | Disposition: A | Discharge status: Home / Self Care | Payer: Commercial Managed Care - HMO | Source: Ambulatory Visit | Attending: Obstetrics and Gynecology | Admitting: Obstetrics and Gynecology

## 2022-08-14 DIAGNOSIS — N7011 Chronic salpingitis: Secondary | ICD-10-CM | POA: Diagnosis present

## 2022-08-14 DIAGNOSIS — N83201 Unspecified ovarian cyst, right side: Secondary | ICD-10-CM

## 2022-08-15 ENCOUNTER — Ambulatory Visit: Payer: Commercial Managed Care - HMO | Admitting: Obstetrics and Gynecology

## 2022-08-15 ENCOUNTER — Other Ambulatory Visit: Payer: Self-pay

## 2022-08-15 ENCOUNTER — Encounter: Payer: Self-pay | Admitting: Obstetrics and Gynecology

## 2022-08-15 VITALS — BP 151/106 | HR 75 | Ht 64.0 in | Wt 304.0 lb

## 2022-08-15 DIAGNOSIS — N9489 Other specified conditions associated with female genital organs and menstrual cycle: Secondary | ICD-10-CM

## 2022-08-15 DIAGNOSIS — I10 Essential (primary) hypertension: Secondary | ICD-10-CM | POA: Diagnosis not present

## 2022-08-15 NOTE — Progress Notes (Signed)
Obstetrics and Gynecology Visit Return Patient Evaluation  Appointment Date: 08/15/2022  Primary Care Provider: Quita Walsh  OBGYN Clinic: [facility]   Chief Complaint: ultrasound follow up  History of Present Illness: Misty Walsh is a 32 y.o. G1P0101 (Patient's last menstrual period was 07/31/2022.), seen for the above chief complaint. Her past medical history is significant for BMI 50s, DM2, HTN, h/o classical c-section and h/o LEEP, h/o STDs   Patient presented to the ED on 12/27 for generalized aches, chills; she states that flu ran through the house during the holidays.  She also endorsed low belly abdominal pain going on for over a year so a CT and u/s was ordered. CT showed dilated fluid filled tubular structure in the right adnexa c/w a hydro. U/s showed findings c/w hemato vs pyosalpinx. She had a CT renal stone study in November 2022 with negative findings, and she states s/s were ongoing at that time.    GYN Dr. Shawnie Walsh consulted and the patient received Rocephin and was discharged to home on PO doxycycline, which she confirms she took and finished.    I saw her for follow up on 1/31 and I did not feel that her abdominal pain was what I was seeing on imaging and I recommended a repeat early cycle u/s in 2-3 months and to get a UCx which came back positive and she states she took the abx. . I also placed a GI referral (patient no sho'wed).    I saw her for u/s follow up on 4/17 and repeat u/s showed stable right sided hydrosalpinx and new 4cm complex RO lesion, likely a hemorrhagic cyst, small and non specific free fluid and still normal uterus; LO not seen   Pap: negative 01/2021  STD swabs: negative 01/2022  Interval History: patient states she was asymptomatic until her u/s yesterday and has had some pelvic pain since then.   Review of Systems: as noted in the History of Present Illness.  Patient Active Problem List   Diagnosis  Date Noted   Cyst of right ovary 06/05/2022   Episodic cluster headache, not intractable 04/09/2022   Hypoventilation associated with obesity syndrome (HCC) 04/09/2022   Sleep-related headache 04/09/2022   Loud snoring 04/09/2022   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/2022   Anxiety and depression 04/09/2022   Adnexal mass 03/20/2022   Symptomatic mammary hypertrophy 12/07/2019   Back pain 12/07/2019   Neck pain 12/07/2019   Super obesity 11/09/2019   Prediabetes 11/04/2019   Elevated cholesterol 11/04/2019   Vitamin D deficiency 11/04/2019   Hemorrhagic ovarian cyst 06/21/2017   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 Current Outpatient Medications  Medication Sig Dispense Refill   acetaminophen (TYLENOL) 325 MG tablet Take 650 mg by mouth every 6 (six) hours as needed for mild pain, fever or headache.     atorvastatin (LIPITOR) 80 MG tablet Take 80 mg by mouth daily.     busPIRone (BUSPAR) 15 MG tablet Take 15 mg by mouth 2 (two) times daily.     cetirizine (ZYRTEC) 10 MG tablet Take 10 mg by mouth daily.     fluticasone (FLONASE) 50 MCG/ACT nasal spray Place 1 spray into both nostrils daily. Begin by using 2 sprays in each nare daily for 3 to 5 days, then  decrease to 1 spray in each nare daily. 32 mL 1   hydrochlorothiazide (HYDRODIURIL) 50 MG tablet Take 50 mg by mouth daily.     sertraline (ZOLOFT) 25 MG tablet Take 25 mg by mouth daily.     ALPRAZolam (XANAX) 0.5 MG tablet Take 1 tablet (0.5 mg total) by mouth at bedtime as needed for anxiety. (Patient not taking: Reported on 06/05/2022) 2 tablet 0   polyethylene glycol (MIRALAX / GLYCOLAX) 17 g packet Take 17 g by mouth 2 (two) times daily. (Patient not taking: Reported on 08/15/2022) 14 each 0   No current facility-administered  medications for this visit.    Allergies: is allergic to macrobid [nitrofurantoin macrocrystal].  Physical Exam:  BP (!) 151/106   Pulse 75   Ht 5\' 4"  (1.626 m)   Wt (!) 304 lb (137.9 kg)   LMP 07/31/2022   BMI 52.18 kg/m  Body mass index is 52.18 kg/m. General appearance: Well nourished, well developed female in no acute distress.  Neuro/Psych:  Normal mood and affect.    Imaging:  U/s still not read yet but u/s imaging looks stable with RO with simple appearing multiple cysts and right hydro stable in size to slightly larger.    Assessment: patient stable  Plan:  1. Adnexal mass I d/w her that given the u/s characteristics of that tube, that surgery may be prudent but given her co-morbidities surgery may cause more harm than potentially help. Pt states she is still getting her sleep study set up and needs to see her PCP. I will refer to cardiology to help with her HTN management and check a CA125 and RTC in 23m to see Misty Walsh. I told her that Misty Walsh will evaluate her and if she believes that surgery is prudent that a Robotic approach with her may be safer for the patient given her size, h/o c-section, STDs and potential for scar tissue  - CA 125 - US Transvaginal Non-OB; Future  2. Hypertension, unspecified type - Ambulatory referral to Cardiology   Return in about 3 months (around 11/15/2022) for in person.  Future Appointments  Date Time Provider Department Center  10/15/2022 11:00 AM [facility] 1 [facility] [place]    Misty Copa MD Attending [facility])

## 2022-08-16 LAB — CA 125: Cancer Antigen (CA) 125: 6.2 U/mL (ref 0.0–38.1)

## 2022-08-21 NOTE — Telephone Encounter (Signed)
Patient called wanting to schedule her SS and has her new insurance.  I started new case with [employer] for [employer]. It is pending for Split.

## 2022-08-30 ENCOUNTER — Encounter (HOSPITAL_BASED_OUTPATIENT_CLINIC_OR_DEPARTMENT_OTHER): Payer: Self-pay

## 2022-09-05 DIAGNOSIS — Z0289 Encounter for other administrative examinations: Secondary | ICD-10-CM

## 2022-09-05 NOTE — Telephone Encounter (Signed)
Called [employer] they stated it is still pending.

## 2022-09-16 NOTE — Telephone Encounter (Signed)
[employer] [name] [id number] (exp. 09/09/22 to 12/08/22)

## 2022-09-24 ENCOUNTER — Telehealth: Payer: Self-pay | Admitting: Obstetrics and Gynecology

## 2022-09-24 NOTE — Telephone Encounter (Signed)
Spoke to patient about her appointment.

## 2022-10-09 ENCOUNTER — Ambulatory Visit (INDEPENDENT_AMBULATORY_CARE_PROVIDER_SITE_OTHER): Payer: Commercial Managed Care - HMO | Admitting: Internal Medicine

## 2022-10-15 ENCOUNTER — Ambulatory Visit (HOSPITAL_COMMUNITY): Payer: Commercial Managed Care - HMO

## 2022-10-23 ENCOUNTER — Ambulatory Visit (INDEPENDENT_AMBULATORY_CARE_PROVIDER_SITE_OTHER): Payer: Commercial Managed Care - HMO | Admitting: Internal Medicine

## 2022-10-29 ENCOUNTER — Ambulatory Visit: Payer: Commercial Managed Care - HMO | Admitting: Obstetrics and Gynecology

## 2022-11-13 ENCOUNTER — Encounter (HOSPITAL_BASED_OUTPATIENT_CLINIC_OR_DEPARTMENT_OTHER): Payer: Self-pay | Admitting: Cardiology

## 2022-11-13 ENCOUNTER — Ambulatory Visit (HOSPITAL_BASED_OUTPATIENT_CLINIC_OR_DEPARTMENT_OTHER): Payer: Managed Care, Other (non HMO) | Admitting: Cardiology

## 2022-11-13 VITALS — BP 136/88 | HR 86 | Ht 64.0 in | Wt 307.0 lb

## 2022-11-13 DIAGNOSIS — E7800 Pure hypercholesterolemia, unspecified: Secondary | ICD-10-CM | POA: Diagnosis not present

## 2022-11-13 DIAGNOSIS — E119 Type 2 diabetes mellitus without complications: Secondary | ICD-10-CM

## 2022-11-13 DIAGNOSIS — E8881 Metabolic syndrome: Secondary | ICD-10-CM

## 2022-11-13 DIAGNOSIS — I10 Essential (primary) hypertension: Secondary | ICD-10-CM | POA: Diagnosis not present

## 2022-11-13 DIAGNOSIS — Z7189 Other specified counseling: Secondary | ICD-10-CM | POA: Diagnosis not present

## 2022-11-13 MED ORDER — CHLORTHALIDONE 25 MG PO TABS: 25.0000 mg | ORAL_TABLET | Freq: Every day | ORAL | 3 refills | Status: DC

## 2022-11-13 NOTE — Patient Instructions (Signed)
Medication Instructions:  Your physician has recommended you make the following change in your medication:   Stop: hydrochlorothiazide   Start: chlorthalidone 25mg  daily  *If you need a refill on your cardiac medications before your next appointment, please call your pharmacy*   Lab Work: Labs next week at [facility]   Follow-Up: At [facility], you and your health needs are our priority.  As part of our continuing mission to provide you with exceptional heart care, we have created designated Provider Care Teams.  These Care Teams include your primary Cardiologist (physician) and Advanced Practice Providers (APPs -  Physician Assistants and Nurse Practitioners) who all work together to provide you with the care you need, when you need it.  We recommend signing up for the patient portal called "MyChart".  Sign up information is provided on this After Visit Summary.  MyChart is used to connect with patients for Virtual Visits (Telemedicine).  Patients are able to view lab/test results, encounter notes, upcoming appointments, etc.  Non-urgent messages can be sent to your provider as well.   To learn more about what you can do with MyChart, go to [email or web address].    Your next appointment:   3 months with Dr. Cristal Deer

## 2022-11-13 NOTE — Progress Notes (Signed)
Cardiology Office Note:  .    Date:  11/13/2022  ID:  Misty Walsh, DOB 1990/10/06, MRN [id number] PCP: Quita Skye, PA-C  [facility] Providers Cardiologist:  Jodelle Red, MD     History of Present Illness: Misty Walsh is a 32 y.o. female with a hx of hypertension, hyperlipidemia, diabetes type 2, here for the evaluation of hypertension at the request of Dr. [place] Bing. She was seen by Dr. Vergie Living 08/15/2022 and her blood pressure was 151/106 on a regimen of HCTZ 50 mg. She was in the process of setting up a sleep study. She was referred to cardiology for assistance with hypertension management.   Cardiovascular risk factors: Prior clinical ASCVD: None. Comorbid conditions: Hypertension - Has struggled with elevated blood pressure and obesity since she was about 30-75 years old. She was started on medication at age 33. Previously tried lisinopril and is currently only on HCTZ. This was increased to 50 mg about 3 months ago. Occasionally she experiences a dizziness in her eyes, which seems to have correlated with increasing her HCTZ to the 50 mg dose.  In the office her blood pressure is 136/88. She has a history of preeclampsia. She is not planning any future pregnancies. Hyperlipidemia - Diagnosed about 3-4 years ago, started on atorvastatin 80 mg around that time. Diabetes - Diagnosed January 2023, prediabetic for about 8 years prior to that. Was taking Victoza which caused nausea and feeling like she was hypoglycemic; was discontinued earlier this year.  Metabolic syndrome/Obesity:  Highest adult weight is her current weight. Today she is 307 lbs in the office. Her weight has ranged from 289 to 310 lbs for the last 4 years. Currently she is scheduled to establish care with [facility] next [facility]. Chronic inflammatory conditions:  None. Tobacco use history:  Former smoker, she quit smoking about 3 years ago.  Family history:  Strokes, heart attacks. Her brother is 65 yo and has kidney disease, heart failure. Prior pertinent testing and/or incidental findings:  None Exercise level:  No formal exercise. She complains of becoming fatigued easily, more so in the past year. Current diet:  Enjoys pasta, fruits, vegetables, grapes, peaches, pineapples. Prefers cabbage, kale, green beans, asparagus. May have 1-2 glasses of wine on the weekends.  She denies any palpitations, chest pain, shortness of breath, peripheral edema, headaches, syncope, orthopnea, or PND.  ROS:  Please see the history of present illness. ROS otherwise negative except as noted.  (+) Fatigue, exercise intolerance (+) Occasional dizziness in eyes  Studies Reviewed: Marland Kitchen    EKG Interpretation Date/Time:  Wednesday November 13 2022 16:06:23 EDT Ventricular Rate:  87 PR Interval:  132 QRS Duration:  80 QT Interval:  354 QTC Calculation: 425 R Axis:   6  Text Interpretation: Normal sinus rhythm Minimal voltage criteria for LVH, may be normal variant Confirmed by Jodelle Red [phone]) on 11/13/2022 4:17:06 PM    Physical Exam:    VS:  BP 136/88   Pulse 86   Ht 5\' 4"  (1.626 m)   Wt (!) 307 lb (139.3 kg)   SpO2 96%   BMI 52.70 kg/m    Wt Readings from Last 3 Encounters:  11/13/22 (!) 307 lb (139.3 kg)  08/15/22 (!) 304 lb (137.9 kg)  06/05/22 299 lb 4.8 oz (135.8 kg)    GEN: Well nourished, well developed in no acute distress HEENT: Normal, moist mucous membranes NECK: No JVD CARDIAC: regular rhythm,  normal S1 and S2, no rubs or gallops. No murmur. VASCULAR: Radial and DP pulses 2+ bilaterally. No carotid bruits RESPIRATORY:  Clear to auscultation without rales, wheezing or rhonchi  ABDOMEN: Soft, non-tender, non-distended MUSCULOSKELETAL:  Ambulates independently SKIN: Warm and dry, no edema NEUROLOGIC:  Alert and oriented x 3. No focal neuro deficits noted. PSYCHIATRIC:  Normal affect   ASSESSMENT AND PLAN: .    CV risk:  consistent with metabolic syndrome -history of preeclampsia with preterm birth -hypertension -hypercholesterolemia -morbid obesity, BMI 52.7 -type II diabetes  Hypercholesterolemia -continue atorvastatin -no plans for pregnancy  Hypertension -Stop HCTZ, switch to 25 mg chlorthalidone. -suspect BP will improve with weight loss, but if still elevated on follow up, add second med (amlodipine vs ARB)  Morbid obesity Type II diabetes -was on victoza before, had nausea. Given her CV risk, comorbidities, I think mounjaro would be a good choice for her if tolerated -she has a visit with [facility] next week to re-establish, will follow along with their recommendations -she is at her peak weight, 307 lbs, BMI 52.7  Dispo: Follow-up in 3 months, or sooner as needed.  I,Mathew Stumpf,acting as a Neurosurgeon for [employer], MD.,have documented all relevant documentation on the behalf of Jodelle Red, MD,as directed by  Jodelle Red, MD while in the presence of Jodelle Red, MD.  I, Jodelle Red, MD, have reviewed all documentation for this visit. The documentation on 11/13/22 for the exam, diagnosis, procedures, and orders are all accurate and complete.   Signed, Jodelle Red, MD

## 2022-11-20 ENCOUNTER — Ambulatory Visit (INDEPENDENT_AMBULATORY_CARE_PROVIDER_SITE_OTHER): Payer: Managed Care, Other (non HMO) | Admitting: Internal Medicine

## 2022-11-20 ENCOUNTER — Encounter (INDEPENDENT_AMBULATORY_CARE_PROVIDER_SITE_OTHER): Payer: Self-pay | Admitting: Internal Medicine

## 2022-11-20 VITALS — BP 138/86 | HR 76 | Temp 97.8°F | Ht 64.0 in | Wt 305.0 lb

## 2022-11-20 DIAGNOSIS — Z6841 Body Mass Index (BMI) 40.0 and over, adult: Secondary | ICD-10-CM

## 2022-11-20 DIAGNOSIS — E559 Vitamin D deficiency, unspecified: Secondary | ICD-10-CM

## 2022-11-20 DIAGNOSIS — R7303 Prediabetes: Secondary | ICD-10-CM

## 2022-11-20 DIAGNOSIS — Z1331 Encounter for screening for depression: Secondary | ICD-10-CM | POA: Diagnosis not present

## 2022-11-20 DIAGNOSIS — R29818 Other symptoms and signs involving the nervous system: Secondary | ICD-10-CM | POA: Insufficient documentation

## 2022-11-20 DIAGNOSIS — R5383 Other fatigue: Secondary | ICD-10-CM

## 2022-11-20 DIAGNOSIS — R0602 Shortness of breath: Secondary | ICD-10-CM | POA: Diagnosis not present

## 2022-11-20 DIAGNOSIS — I10 Essential (primary) hypertension: Secondary | ICD-10-CM

## 2022-11-20 DIAGNOSIS — Z723 Lack of physical exercise: Secondary | ICD-10-CM

## 2022-11-20 DIAGNOSIS — E66813 Obesity, class 3: Secondary | ICD-10-CM

## 2022-11-20 DIAGNOSIS — E7800 Pure hypercholesterolemia, unspecified: Secondary | ICD-10-CM

## 2022-11-20 NOTE — Assessment & Plan Note (Signed)
Contiributors: Fam hx, low volume of PA, chronic skipping, very hungry in the evening, problems with portion.

## 2022-11-20 NOTE — Assessment & Plan Note (Signed)
Patient counseled on health effects of sedentary lifestyle.  She will be provided with exercise counseling at the next office visit

## 2022-11-20 NOTE — Assessment & Plan Note (Signed)
Blood pressure is above target.  She is currently on chlorthalidone 25 mg once a week.  We are checking renal parameters today.  She will continue current regimen.  Losing 10% of body weight may improve condition.  Will discuss monitoring at the next office visit as she may need treatment intensification to reduce cardiovascular risk.

## 2022-11-20 NOTE — Assessment & Plan Note (Signed)
Her Epworth is 7 but she has symptoms of disordered sleep breathing.  She has been referred for sleep study by her primary care team which I think is appropriate.

## 2022-11-20 NOTE — Assessment & Plan Note (Signed)
LDL is not at goal. Elevated LDL may be secondary to nutrition, genetics and spillover effect from excess adiposity. Recommended LDL goal is <70 to reduce the risk of fatty streaks and the progression to obstructive ASCVD in the future.   Her 10 year risk is: The ASCVD Risk score (Arnett DK, et al., 2019) failed to calculate for the following reasons:   The 2019 ASCVD risk score is only valid for ages 58 to 28  Lab Results  Component Value Date   CHOL 252 (H) 05/25/2020   HDL 58 05/25/2020   LDLCALC 175 (H) 05/25/2020   TRIG 109 05/25/2020    Continue weight loss therapy, losing 10% or more of body weight may improve condition. Also advised to reduce saturated fats in diet to less than 10% of daily calories.  We are checking fasting lipid profile today.  After this is updated we will again reassess cardiovascular risk by other methods.

## 2022-11-20 NOTE — Progress Notes (Addendum)
This note is pending transcription. Please see paper chart for other note components.  BP 138/86   Pulse 76   Temp 97.8 F (36.6 C)   Ht 5\' 4"  (1.626 m)   Wt (!) 305 lb (138.3 kg)   LMP 10/20/2022   SpO2 96%   BMI 52.35 kg/m    Other fatigue -     Vitamin B12 -     CBC with Differential/Platelet  SOB (shortness of breath) on exertion  Depression screen  Prediabetes Assessment & Plan: Most recent A1c is  Lab Results  Component Value Date   HGBA1C 6.4 (H) 06/05/2022   HGBA1C 5.1 09/16/2012    Patient aware of disease state and risk of progression. This may contribute to abnormal cravings, fatigue and diabetic complications without having diabetes.   We are checking hemoglobin A1c, fasting blood glucose and insulin levels today.  We have discussed treatment options which include: losing 7 to 10% of body weight, increasing physical activity to a goal of 150 minutes a week at moderate intensity.  Advised to maintain a diet low on simple and processed carbohydrates.  She may also be a candidate for pharmacoprophylaxis with metformin or incretin mimetic.    Orders: -     Comprehensive metabolic panel -     Hemoglobin A1c -     Insulin, random  Vitamin D deficiency Assessment & Plan: By history.  Currently not on supplementation.  Because of excess adiposity she is at risk for vitamin D deficiency we will check vitamin D levels today.  Orders: -     VITAMIN D 25 Hydroxy (Vit-D Deficiency, Fractures)  Elevated cholesterol Assessment & Plan: LDL is not at goal. Elevated LDL may be secondary to nutrition, genetics and spillover effect from excess adiposity. Recommended LDL goal is <70 to reduce the risk of fatty streaks and the progression to obstructive ASCVD in the future.   Her 10 year risk is: The ASCVD Risk score (Arnett DK, et al., 2019) failed to calculate for the following reasons:   The 2019 ASCVD risk score is only valid for ages 31 to 37  Lab Results   Component Value Date   CHOL 252 (H) 05/25/2020   HDL 58 05/25/2020   LDLCALC 175 (H) 05/25/2020   TRIG 109 05/25/2020    Continue weight loss therapy, losing 10% or more of body weight may improve condition. Also advised to reduce saturated fats in diet to less than 10% of daily calories.  We are checking fasting lipid profile today.  After this is updated we will again reassess cardiovascular risk by other methods.     Orders: -     Lipid Panel With LDL/HDL Ratio  Class 3 severe obesity with serious comorbidity and body mass index (BMI) of 50.0 to 59.9 in adult, unspecified obesity type [facility]) Assessment & Plan: Contiributors: Fam hx, low volume of PA, chronic skipping, very hungry in the evening, problems with portion.   Orders: -     TSH  Physically inactive Assessment & Plan: Patient counseled on health effects of sedentary lifestyle.  She will be provided with exercise counseling at the next office visit   Suspected sleep apnea Assessment & Plan: Her Epworth is 7 but she has symptoms of disordered sleep breathing.  She has been referred for sleep study by her primary care team which I think is appropriate.   Primary hypertension Assessment & Plan: Blood pressure is above target.  She is currently on chlorthalidone 25 mg  once a week.  We are checking renal parameters today.  She will continue current regimen.  Losing 10% of body weight may improve condition.  Will discuss monitoring at the next office visit as she may need treatment intensification to reduce cardiovascular risk.   I have spent 60 minutes in the care of the patient today including: preparing to see patient (e.g. review and interpretation of tests, old notes ), obtaining and/or reviewing separately obtained history, performing a medically appropriate examination or evaluation, counseling and educating the patient, ordering medications, test or procedures, documenting clinical information in the electronic or  other health care record, and independently interpreting results and communicating results to the patient, family, or caregiver

## 2022-11-20 NOTE — Assessment & Plan Note (Signed)
By history.  Currently not on supplementation.  Because of excess adiposity she is at risk for vitamin D deficiency we will check vitamin D levels today.

## 2022-11-20 NOTE — Assessment & Plan Note (Signed)
Most recent A1c is  Lab Results  Component Value Date   HGBA1C 6.4 (H) 06/05/2022   HGBA1C 5.1 09/16/2012    Patient aware of disease state and risk of progression. This may contribute to abnormal cravings, fatigue and diabetic complications without having diabetes.   We are checking hemoglobin A1c, fasting blood glucose and insulin levels today.  We have discussed treatment options which include: losing 7 to 10% of body weight, increasing physical activity to a goal of 150 minutes a week at moderate intensity.  Advised to maintain a diet low on simple and processed carbohydrates.  She may also be a candidate for pharmacoprophylaxis with metformin or incretin mimetic.

## 2022-11-21 LAB — LIPID PANEL WITH LDL/HDL RATIO
Cholesterol, Total: 199 mg/dL (ref 100–199)
HDL: 52 mg/dL (ref >39)
LDL Chol Calc (NIH): 128 mg/dL — ABNORMAL HIGH (ref 0–99)
LDL/HDL Ratio: 2.5 {ratio} (ref 0.0–3.2)
Triglycerides: 103 mg/dL (ref 0–149)
VLDL Cholesterol Cal: 19 mg/dL (ref 5–40)

## 2022-11-21 LAB — COMPREHENSIVE METABOLIC PANEL
ALT: 26 [IU]/L (ref 0–32)
AST: 20 [IU]/L (ref 0–40)
Albumin: 4.3 g/dL (ref 3.9–4.9)
Alkaline Phosphatase: 100 [IU]/L (ref 44–121)
BUN/Creatinine Ratio: 13 (ref 9–23)
BUN: 12 mg/dL (ref 6–20)
Bilirubin Total: 0.4 mg/dL (ref 0.0–1.2)
CO2: 21 mmol/L (ref 20–29)
Calcium: 9.1 mg/dL (ref 8.7–10.2)
Chloride: 103 mmol/L (ref 96–106)
Creatinine, Ser: 0.92 mg/dL (ref 0.57–1.00)
Globulin, Total: 2.7 g/dL (ref 1.5–4.5)
Glucose: 123 mg/dL — ABNORMAL HIGH (ref 70–99)
Potassium: 4.4 mmol/L (ref 3.5–5.2)
Sodium: 139 mmol/L (ref 134–144)
Total Protein: 7 g/dL (ref 6.0–8.5)
eGFR: 84 mL/min/{1.73_m2} (ref >59)

## 2022-11-21 LAB — CBC WITH DIFFERENTIAL/PLATELET
Basophils Absolute: 0.1 10*3/uL (ref 0.0–0.2)
Basos: 1 %
EOS (ABSOLUTE): 0.1 10*3/uL (ref 0.0–0.4)
Eos: 2 %
Hematocrit: 41.8 % (ref 34.0–46.6)
Hemoglobin: 13.5 g/dL (ref 11.1–15.9)
Immature Grans (Abs): 0 10*3/uL (ref 0.0–0.1)
Immature Granulocytes: 0 %
Lymphocytes Absolute: 1.8 10*3/uL (ref 0.7–3.1)
Lymphs: 35 %
MCH: 30.8 pg (ref 26.6–33.0)
MCHC: 32.3 g/dL (ref 31.5–35.7)
MCV: 95 fL (ref 79–97)
Monocytes Absolute: 0.5 10*3/uL (ref 0.1–0.9)
Monocytes: 9 %
Neutrophils Absolute: 2.6 10*3/uL (ref 1.4–7.0)
Neutrophils: 53 %
Platelets: 233 10*3/uL (ref 150–450)
RBC: 4.38 x10E6/uL (ref 3.77–5.28)
RDW: 12.3 % (ref 11.7–15.4)
WBC: 5.1 10*3/uL (ref 3.4–10.8)

## 2022-11-21 LAB — TSH: TSH: 2.85 u[IU]/mL (ref 0.450–4.500)

## 2022-11-21 LAB — HEMOGLOBIN A1C
Est. average glucose Bld gHb Est-mCnc: 140 mg/dL
Hgb A1c MFr Bld: 6.5 % — ABNORMAL HIGH (ref 4.8–5.6)

## 2022-11-21 LAB — VITAMIN B12: Vitamin B-12: 347 pg/mL (ref 232–1245)

## 2022-11-21 LAB — VITAMIN D 25 HYDROXY (VIT D DEFICIENCY, FRACTURES): Vit D, 25-Hydroxy: 17.9 ng/mL — ABNORMAL LOW (ref 30.0–100.0)

## 2022-11-21 LAB — INSULIN, RANDOM: INSULIN: 41.9 u[IU]/mL — ABNORMAL HIGH (ref 2.6–24.9)

## 2022-11-21 NOTE — Progress Notes (Signed)
Chief Complaint:   OBESITY Misty Walsh (MR# [id number]) is a 32 y.o. female who presents for evaluation and treatment of obesity and related comorbidities. Current BMI is Body mass index is 52.35 kg/m. Misty Walsh has been struggling with her weight for many years and has been unsuccessful in either losing weight, maintaining weight loss, or reaching her healthy weight goal.  Misty Walsh is currently in the action stage of change and ready to dedicate time achieving and maintaining a healthier weight. Misty Walsh is interested in becoming our patient and working on intensive lifestyle modifications including (but not limited to) diet and exercise for weight loss.  Misty Walsh's habits were reviewed today and are as follows: Her family eats meals together, she thinks her family will eat healthier with her, her desired weight loss is 105 lbs, she has been heavy most of her life, her heaviest weight ever was 310 pounds, she has significant food cravings issues, she skips meals frequently, she is frequently drinking liquids with calories, she frequently makes poor food choices, she frequently eats larger portions than normal, and she struggles with emotional eating.  Depression Screen Misty Walsh's Food and Mood (modified PHQ-9) score was 14.  Subjective:   1. Other fatigue Misty Walsh admits to daytime somnolence and admits to waking up still tired. Patient has a history of symptoms of daytime fatigue, morning fatigue, and morning headache. Misty Walsh generally gets 5 hours of sleep per night, and states that she has nightime awakenings. Snoring is present. Apneic episodes are not present. Epworth Sleepiness Score is 7.   2. SOB (shortness of breath) on exertion Misty Walsh notes increasing shortness of breath with exercising and seems to be worsening over time with weight gain. She notes getting out of breath sooner with activity than she used to. This has not gotten worse recently. Misty Walsh denies shortness of breath at rest  or orthopnea.  3. Prediabetes Patient aware of disease state and risk of progression. This may contribute to abnormal cravings, fatigue and diabetic complications without having diabetes.   Most recent A1c is  Lab Results  Component Value Date   HGBA1C 6.4 (H) 06/05/2022   HGBA1C 5.1 09/16/2012   4. Vitamin D deficiency By history.  Currently not on supplementation.  5. Elevated cholesterol LDL is not at goal. Elevated LDL may be secondary to nutrition, genetics and spillover effect from excess adiposity. Recommended LDL goal is <70 to reduce the risk of fatty streaks and the progression to obstructive ASCVD in the future.   Her 10 year risk is: The ASCVD Risk score (Arnett DK, et al., 2019) failed to calculate for the following reasons: The 2019 ASCVD risk score is only valid for ages 59 to 80.  Lab Results  Component Value Date   CHOL 252 (H) 05/25/2020   HDL 58 05/25/2020   LDLCALC 175 (H) 05/25/2020   TRIG 109 05/25/2020   6. Physically inactive Patient counseled on health effects of sedentary lifestyle.  7. Suspected sleep apnea Her Epworth is 7 but she has symptoms of disordered sleep breathing.  8. Primary hypertension Blood pressure is above target.  She is currently on chlorthalidone 25 mg once a week.  Assessment/Plan:   1. Other fatigue Misty Walsh does feel that her weight is causing her energy to be lower than it should be. Fatigue may be related to obesity, depression or many other causes. Labs will be ordered, and in the meanwhile, Misty Walsh will focus on self care including making healthy food choices, increasing physical  activity and focusing on stress reduction.  - Vitamin B12 - CBC with Differential/Platelet  2. SOB (shortness of breath) on exertion Misty Walsh does feel that she gets out of breath more easily that she used to when she exercises. Misty Walsh's shortness of breath appears to be obesity related and exercise induced. She has agreed to work on weight loss and  gradually increase exercise to treat her exercise induced shortness of breath. Will continue to monitor closely.  3. Prediabetes We are checking hemoglobin A1c, fasting blood glucose and insulin levels today.  We have discussed treatment options which include: losing 7 to 10% of body weight, increasing physical activity to a goal of 150 minutes a week at moderate intensity.  Advised to maintain a diet low on simple and processed carbohydrates.  She may also be a candidate for pharmacoprophylaxis with metformin or incretin mimetic.   - Comprehensive metabolic panel - Hemoglobin A1c - Insulin, random  4. Vitamin D deficiency Because of excess adiposity she is at risk for vitamin D deficiency we will check vitamin D levels today.  - VITAMIN D 25 Hydroxy (Vit-D Deficiency, Fractures)  5. Elevated cholesterol Continue weight loss therapy, losing 10% or more of body weight may improve condition. Also advised to reduce saturated fats in diet to less than 10% of daily calories.  We are checking fasting lipid profile today.  After this is updated we will again reassess cardiovascular risk by other methods.  - Lipid Panel With LDL/HDL Ratio  6. Physically inactive She will be provided with exercise counseling at the next office visit.  7. Suspected sleep apnea She has been referred for sleep study by her primary care team which I think is appropriate.  8. Primary hypertension We are checking renal parameters today.  She will continue current regimen.  Losing 10% of body weight may improve condition.  Will discuss monitoring at the next office visit as she may need treatment intensification to reduce cardiovascular risk.  9. Depression screen Misty Walsh had a positive depression screening. Depression is commonly associated with obesity and often results in emotional eating behaviors. We will monitor this closely and work on CBT to help improve the non-hunger eating patterns. Referral to Psychology may  be required if no improvement is seen as she continues in our clinic.  10. Class 3 severe obesity with serious comorbidity and body mass index (BMI) of 50.0 to 59.9 in adult, unspecified obesity type (HCC) Contiributors: Fam hx, low volume of PA, chronic skipping, very hungry in the evening, problems with portion. Labs will be obtained today.  - TSH  Misty Walsh is currently in the action stage of change and her goal is to continue with weight loss efforts. I recommend Misty Walsh begin the structured treatment plan as follows:  She has agreed to the Category 3 Plan.  Exercise goals: All adults should avoid inactivity. Some physical activity is better than none, and adults who participate in any amount of physical activity gain some health benefits.   Behavioral modification strategies: increasing lean protein intake, decreasing simple carbohydrates, increasing vegetables, increasing water intake, decreasing liquid calories, increasing high fiber foods, no skipping meals, meal planning and cooking strategies, better snacking choices, and planning for success.  She was informed of the importance of frequent follow-up visits to maximize her success with intensive lifestyle modifications for her multiple health conditions. She was informed we would discuss her lab results at her next visit unless there is a critical issue that needs to be addressed sooner. Misty Walsh  agreed to keep her next visit at the agreed upon time to discuss these results.  Objective:   Blood pressure 138/86, pulse 76, temperature 97.8 F (36.6 C), height 5\' 4"  (1.626 m), weight (!) 305 lb (138.3 kg), last menstrual period 10/20/2022, SpO2 96%. Body mass index is 52.35 kg/m.  EKG: Normal sinus rhythm, rate 87 BPM.  Indirect Calorimeter completed today shows a VO2 of 305 and a REE of 2102.  Her calculated basal metabolic rate is 7829 thus her basal metabolic rate is worse than expected.  General: Cooperative, alert, well developed, in  no acute distress. HEENT: Conjunctivae and lids unremarkable. Cardiovascular: Regular rhythm.  Lungs: Normal work of breathing. Neurologic: No focal deficits.   Lab Results  Component Value Date   CREATININE 0.92 11/20/2022   BUN 12 11/20/2022   NA 139 11/20/2022   K 4.4 11/20/2022   CL 103 11/20/2022   CO2 21 11/20/2022   Lab Results  Component Value Date   ALT 26 11/20/2022   AST 20 11/20/2022   ALKPHOS 100 11/20/2022   BILITOT 0.4 11/20/2022   Lab Results  Component Value Date   HGBA1C 6.5 (H) 11/20/2022   HGBA1C 6.4 (H) 06/05/2022   HGBA1C 5.6 05/25/2020   HGBA1C 6.3 (H) 11/03/2019   HGBA1C 5.1 09/16/2012   Lab Results  Component Value Date   INSULIN 41.9 (H) 11/20/2022   INSULIN 41.0 (H) 05/25/2020   INSULIN 29.5 (H) 11/03/2019   Lab Results  Component Value Date   TSH 2.850 11/20/2022   Lab Results  Component Value Date   CHOL 199 11/20/2022   HDL 52 11/20/2022   LDLCALC 128 (H) 11/20/2022   TRIG 103 11/20/2022   Lab Results  Component Value Date   WBC 5.1 11/20/2022   HGB 13.5 11/20/2022   HCT 41.8 11/20/2022   MCV 95 11/20/2022   PLT 233 11/20/2022   No results found for: "IRON", "TIBC", "FERRITIN"  Attestation Statements:   Reviewed by clinician on day of visit: allergies, medications, problem list, medical history, surgical history, family history, social history, and previous encounter notes.  I have spent 60 minutes in the care of the patient today including: preparing to see patient (e.g. review and interpretation of tests, old notes ), obtaining and/or reviewing separately obtained history, performing a medically appropriate examination or evaluation, counseling and educating the patient, ordering medications, test or procedures, documenting clinical information in the electronic or other health care record, and independently interpreting results and communicating results to the patient, family, or caregiver.  I, Burt Knack, am acting  as transcriptionist for Worthy Rancher, MD.  I have reviewed the above documentation for accuracy and completeness, and I agree with the above. -

## 2022-11-28 ENCOUNTER — Other Ambulatory Visit: Payer: Self-pay | Admitting: Neurology

## 2022-12-02 ENCOUNTER — Ambulatory Visit (INDEPENDENT_AMBULATORY_CARE_PROVIDER_SITE_OTHER): Payer: Commercial Managed Care - HMO | Admitting: Neurology

## 2022-12-02 ENCOUNTER — Other Ambulatory Visit: Payer: Self-pay | Admitting: Neurology

## 2022-12-02 ENCOUNTER — Telehealth: Payer: Self-pay | Admitting: Neurology

## 2022-12-02 DIAGNOSIS — E662 Morbid (severe) obesity with alveolar hypoventilation: Secondary | ICD-10-CM

## 2022-12-02 DIAGNOSIS — R519 Headache, unspecified: Secondary | ICD-10-CM

## 2022-12-02 DIAGNOSIS — R0683 Snoring: Secondary | ICD-10-CM | POA: Diagnosis not present

## 2022-12-02 DIAGNOSIS — F5105 Insomnia due to other mental disorder: Secondary | ICD-10-CM

## 2022-12-02 DIAGNOSIS — E669 Obesity, unspecified: Secondary | ICD-10-CM

## 2022-12-02 DIAGNOSIS — G44019 Episodic cluster headache, not intractable: Secondary | ICD-10-CM

## 2022-12-02 MED ORDER — ALPRAZOLAM 0.5 MG PO TABS: 0.5000 mg | ORAL_TABLET | Freq: Every evening | ORAL | 0 refills | Status: DC | PRN

## 2022-12-02 NOTE — Telephone Encounter (Signed)
Order for the medication has been resent to Dr Vickey Huger for the patient

## 2022-12-02 NOTE — Telephone Encounter (Signed)
Patient just called and stated she needed to have some sort of medicine for tonight's sleep study. She stated she contacted her pharmacy about it but they stated it has expired and would need a new rx sent in.

## 2022-12-04 ENCOUNTER — Encounter (HOSPITAL_COMMUNITY): Payer: Self-pay

## 2022-12-04 ENCOUNTER — Emergency Department (HOSPITAL_COMMUNITY)
Admission: EM | Admit: 2022-12-04 | Discharge: 2022-12-04 | Disposition: A | Discharge status: Home / Self Care | Payer: Commercial Managed Care - HMO | Attending: Emergency Medicine | Admitting: Emergency Medicine

## 2022-12-04 ENCOUNTER — Emergency Department (HOSPITAL_COMMUNITY): Payer: Commercial Managed Care - HMO

## 2022-12-04 ENCOUNTER — Other Ambulatory Visit: Payer: Self-pay

## 2022-12-04 ENCOUNTER — Ambulatory Visit (INDEPENDENT_AMBULATORY_CARE_PROVIDER_SITE_OTHER): Payer: Commercial Managed Care - HMO | Admitting: Internal Medicine

## 2022-12-04 DIAGNOSIS — R110 Nausea: Secondary | ICD-10-CM | POA: Diagnosis not present

## 2022-12-04 DIAGNOSIS — I10 Essential (primary) hypertension: Secondary | ICD-10-CM | POA: Insufficient documentation

## 2022-12-04 DIAGNOSIS — R1013 Epigastric pain: Secondary | ICD-10-CM | POA: Insufficient documentation

## 2022-12-04 LAB — COMPREHENSIVE METABOLIC PANEL
ALT: 32 U/L (ref 0–44)
AST: 33 U/L (ref 15–41)
Albumin: 3.8 g/dL (ref 3.5–5.0)
Alkaline Phosphatase: 76 U/L (ref 38–126)
Anion gap: 10 (ref 5–15)
BUN: 8 mg/dL (ref 6–20)
CO2: 20 mmol/L — ABNORMAL LOW (ref 22–32)
Calcium: 8.9 mg/dL (ref 8.9–10.3)
Chloride: 104 mmol/L (ref 98–111)
Creatinine, Ser: 0.91 mg/dL (ref 0.44–1.00)
GFR, Estimated: >60 mL/min (ref >60)
Glucose, Bld: 178 mg/dL — ABNORMAL HIGH (ref 70–99)
Potassium: 4.1 mmol/L (ref 3.5–5.1)
Sodium: 134 mmol/L — ABNORMAL LOW (ref 135–145)
Total Bilirubin: 0.9 mg/dL (ref 0.3–1.2)
Total Protein: 7 g/dL (ref 6.5–8.1)

## 2022-12-04 LAB — CBC WITH DIFFERENTIAL/PLATELET
Abs Immature Granulocytes: 0.02 10*3/uL (ref 0.00–0.07)
Basophils Absolute: 0 10*3/uL (ref 0.0–0.1)
Basophils Relative: 1 %
Eosinophils Absolute: 0.1 10*3/uL (ref 0.0–0.5)
Eosinophils Relative: 1 %
HCT: 39.9 % (ref 36.0–46.0)
Hemoglobin: 13.6 g/dL (ref 12.0–15.0)
Immature Granulocytes: 0 %
Lymphocytes Relative: 28 %
Lymphs Abs: 1.8 10*3/uL (ref 0.7–4.0)
MCH: 31.9 pg (ref 26.0–34.0)
MCHC: 34.1 g/dL (ref 30.0–36.0)
MCV: 93.4 fL (ref 80.0–100.0)
Monocytes Absolute: 0.5 10*3/uL (ref 0.1–1.0)
Monocytes Relative: 8 %
Neutro Abs: 3.9 10*3/uL (ref 1.7–7.7)
Neutrophils Relative %: 62 %
Platelets: 246 10*3/uL (ref 150–400)
RBC: 4.27 MIL/uL (ref 3.87–5.11)
RDW: 12.1 % (ref 11.5–15.5)
WBC: 6.4 10*3/uL (ref 4.0–10.5)
nRBC: 0 % (ref 0.0–0.2)

## 2022-12-04 LAB — HCG, SERUM, QUALITATIVE: Preg, Serum: NEGATIVE

## 2022-12-04 LAB — LIPASE, BLOOD: Lipase: 25 U/L (ref 11–51)

## 2022-12-04 MED ORDER — ACETAMINOPHEN 500 MG PO TABS
1000.0000 mg | ORAL_TABLET | Freq: Once | ORAL | Status: AC
  Administered 2022-12-04: 1000 mg via ORAL
  Filled 2022-12-04: qty 2

## 2022-12-04 NOTE — Discharge Instructions (Signed)
You were seen in the emergency department for your abdominal pain.  Your workup showed no abnormalities with your liver or kidney and no signs of gallstones.  It is likely related to your acid reflux.  You should continue to take your Protonix daily and you can take over-the-counter Pepcid or Maalox as needed for additional pain.  You can follow-up with your primary doctor in the next few days to have your symptoms rechecked.  You should return to the emergency department for significantly worsening pain, fevers, repetitive vomiting or any other new or concerning symptoms.

## 2022-12-04 NOTE — ED Provider Notes (Signed)
[place] EMERGENCY DEPARTMENT AT [facility] Provider Note   CSN: [id number] Arrival date & time: 12/04/22  1324     History  Chief Complaint  Patient presents with   Abdominal Pain    Misty Walsh is a 32 y.o. female.  Patient is a 32 year old female with a past medical history of hypertension, prediabetes and GERD presenting to the emergency department with epigastric pain.  Patient states that she had a biscuit [place] biscuit for breakfast this morning while eating breakfast started to have some nausea and could not finish the whole breakfast.  She states that she went to the nail salon to get her nails done and while sitting in the chair she developed severe epigastric pain radiating into her right upper quadrant.  She states she had worsening nausea but did not vomit.  She denies any fevers.  She denies any diarrhea or constipation, dysuria or hematuria.  She reports since she has been in the ER the pain has started to ease off and now feels more of a pressure.  The history is provided by the patient.  Abdominal Pain      Home Medications Prior to Admission medications   Medication Sig Start Date End Date Taking? Authorizing Provider  ALLERGY RELIEF 180 MG tablet Take by mouth. 06/10/22   [provider]  ALPRAZolam Prudy Feeler) 0.5 MG tablet Take 1 tablet (0.5 mg total) by mouth at bedtime as needed for anxiety (for sleep lab). 12/02/22   Dohmeier, Porfirio Mylar, MD  atorvastatin (LIPITOR) 80 MG tablet Take 80 mg by mouth daily. 03/16/21   [provider]  busPIRone (BUSPAR) 15 MG tablet Take 15 mg by mouth 2 (two) times daily. 06/12/21   [provider]  chlorthalidone (HYGROTON) 25 MG tablet Take 1 tablet (25 mg total) by mouth daily. 11/13/22 02/11/23  Jodelle Red, MD  fluticasone (FLONASE) 50 MCG/ACT nasal spray Place 1 spray into both nostrils daily. Begin by using 2 sprays in each nare daily for 3 to 5 days, then decrease to 1 spray  in each nare daily. 04/09/22   Dohmeier, Porfirio Mylar, MD  pantoprazole (PROTONIX) 20 MG tablet Take by mouth. 04/19/22 04/19/23  [provider]  sertraline (ZOLOFT) 25 MG tablet Take 25 mg by mouth daily. 03/16/21   [provider]      Allergies    Macrobid [nitrofurantoin macrocrystal]    Review of Systems   Review of Systems  Gastrointestinal:  Positive for abdominal pain.    Physical Exam Updated Vital Signs BP (!) 135/91 (BP Location: Right Arm)   Pulse 95   Temp 98.5 F (36.9 C) (Oral)   Resp 20   Ht 5\' 4"  (1.626 m)   Wt (!) 140 kg   LMP 10/20/2022   SpO2 99%   BMI 52.98 kg/m  Physical Exam Vitals and nursing note reviewed.  Constitutional:      General: She is not in acute distress.    Appearance: She is well-developed. She is obese.  HENT:     Head: Normocephalic and atraumatic.     Mouth/Throat:     Mouth: Mucous membranes are moist.  Eyes:     Extraocular Movements: Extraocular movements intact.  Cardiovascular:     Rate and Rhythm: Normal rate and regular rhythm.     Heart sounds: Normal heart sounds.  Pulmonary:     Effort: Pulmonary effort is normal.  Abdominal:     General: Abdomen is flat.     Palpations:  Abdomen is soft.     Tenderness: There is abdominal tenderness in the epigastric area. There is no guarding or rebound.  Skin:    General: Skin is warm and dry.  Neurological:     General: No focal deficit present.     Mental Status: She is alert and oriented to person, place, and time.  Psychiatric:        Mood and Affect: Mood normal.        Behavior: Behavior normal.     ED Results / Procedures / Treatments   Labs (all labs ordered are listed, but only abnormal results are displayed) Labs Reviewed  COMPREHENSIVE METABOLIC PANEL - Abnormal; Notable for the following components:      Result Value   Sodium 134 (*)    CO2 20 (*)    Glucose, Bld 178 (*)    All other components within normal limits  CBC WITH DIFFERENTIAL/PLATELET   LIPASE, BLOOD  HCG, SERUM, QUALITATIVE    EKG EKG Interpretation Date/Time:  Wednesday December 04 2022 13:33:39 EDT Ventricular Rate:  97 PR Interval:  152 QRS Duration:  87 QT Interval:  336 QTC Calculation: 427 R Axis:   40  Text Interpretation: Sinus rhythm Borderline T abnormalities, diffuse leads No significant change since last tracing Confirmed by Elayne Snare ([id number]) on 12/04/2022 2:15:03 PM  Radiology No results found.  Procedures Procedures    Medications Ordered in ED Medications - No data to display  ED Course/ Medical Decision Making/ A&P Clinical Course as of 12/04/22 1534  Wed Dec 04, 2022  1533 Patient signed out to Dr. Adela Lank pending [place] read with plan for likely discharge home. [VK]    Clinical Course User Index [VK] Rexford Maus, DO                                 Medical Decision Making This patient presents to the ED with chief complaint(s) of epigastric pain with pertinent past medical history of HTN, GERD, pre-diabetes which further complicates the presenting complaint. The complaint involves an extensive differential diagnosis and also carries with it a high risk of complications and morbidity.    The differential diagnosis includes pancreatitis, hepatitis, cholelithiasis, cholecystitis, gastritis, GERD  Additional history obtained: Additional history obtained from N/A Records reviewed Primary Care Documents and outpatient cardiology records  ED Course and Reassessment: On patient's arrival she is hemodynamically stable in no acute distress with improving pain.  Pains mostly in the epigastrium radiating to her right upper quadrant.  Due to the pain with eating I suspect likely GERD or possible cholelithiasis.  The patient will have labs and right upper quadrant ultrasound performed.  She declines any pain control at this time and will be closely reassessed.  Independent labs interpretation:  The following labs were independently  interpreted: within normal range  Independent visualization of imaging: - Pending    Amount and/or Complexity of Data Reviewed Labs: ordered. Radiology: ordered.          Final Clinical Impression(s) / ED Diagnoses Final diagnoses:  Epigastric pain    Rx / DC Orders ED Discharge Orders     None         Rexford Maus, DO 12/04/22 1535

## 2022-12-04 NOTE — ED Triage Notes (Signed)
BIBM from nail salon s/p sudden onset epigastric pain. Pain worse with movement. Hx GERD but reports this feels different.Hx HTN on meds.

## 2022-12-04 NOTE — ED Provider Notes (Signed)
Received patient in turnover from Dr. Theresia Lo.  Please see their note for further details of Hx, PE.  Briefly patient is a 32 y.o. female with a Abdominal Pain .  Patient is awaiting an ultrasound to assess for acute cholecystitis.  This is resulted and is negative.  Will discharge the patient home.  PCP follow-up.Adela Lank, Jesusita Oka, DO 12/04/22 [phone]

## 2022-12-07 NOTE — Procedures (Signed)
[facility] POLYSOMNOGRAPHY  INTERPRETATION REPORT   STUDY DATE:  12/02/2022     PATIENT NAME:  Misty Walsh         DATE OF BIRTH:  [birth year]  PATIENT ID:  [id number]    TYPE OF STUDY:  SPLIT  READING PHYSICIAN: Melvyn Novas, MD REFERRED BY:  SCORING TECHNICIAN: Domingo Cocking, RPSGT   HISTORY:  Misty Walsh is 32 year-old Female patient of Dr Marjory Lies and Corinna Capra, DO, referred in 2021 and didn't follow up to have her sleep study done. She is now again seen on 04-09-2022 (  [facility]) and this time for INSOMNIA-  and has a chief complaint about limited time in sleep, complains of still feeling extremely tired during the day, chronic sleep initiation Insomnia, snoring, super obesity - suspected related breathing disorder. Misty Walsh  has a past medical history of Anxiety, Back pain, Depression (med made her navel itching and  made her sleepy so she quit taking them), Edema, lower extremity, Elevated cholesterol, Fibrocystic breast changes of both breasts, GERD (gastroesophageal reflux disease), History of cesarean section, classical (12/18/2012), HPV, , Chlamydia. Hypertension, Joint pain, Moderate dysplasia of cervix, Numbness, Ovarian cyst, and Prediabetes. Had Covid 19 on 10-16-2019 after having had only one shot of vaccine. She was treated Iv MAB, 10-20-2019.  Was having a second new covid infection by 01-2020.  Misty Walsh has definitely risk factors for the presence of obstructive sleep apnea: related to her metabolic syndrome. She is not hypertensive today, but she does have a history of documented prediabetes, and her BMI is 52-53 kg/m .   In order to reduce her BMI, her weight loss specialists have recommended a sleep study to screen for apnea and hopefully help her improve her overall sleep duration.  She knows that she is snoring, but she has never woken up choking or feeling short of air.  Even when she contracted Covid she did  not have necessarily shortness of breath.     2) Morning headaches and headaches waking her out of sleep, pounding, associated with severe neck pain and nausea.  3) Insomnia. 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  He was born at [redacted] weeks gestation. She is highly anxious and hypervigilant.    ADDITIONAL INFORMATION:  The Epworth Sleepiness Scale endorsed at 8 /24 points (scores above or equal to 10 are suggestive of hypersomnolence). FSS was endorsed at  42 /63 points.  Height: 64 in Weight: 306 lbs (BMI 52) Neck Size: 19 " MEDICATIONS: The patient was pr4escribed Xanax for this night at the sleep lab.  no note that she brought it or took it. Tylenol, Lipitor, Buspar, Zyrtec, Flonase, Hydrodiuril, Miralax, Zoloft TECHNICAL DESCRIPTION: A registered sleep technologist ( RPSGT)  was in attendance for the duration of the recording.  Data collection, scoring, video monitoring, and reporting were performed in compliance with the AASM Manual for the Scoring of Sleep and Associated Events; (Hypopnea is scored based on the criteria listed in Section VIII D. 1b in the AASM Manual V2.6 using a 4% oxygen desaturation rule or Hypopnea is scored based on the criteria listed in Section VIII D. 1a in the AASM Manual V2.6 using 3% oxygen desaturation and /or arousal rule).   SLEEP CONTINUITY AND SLEEP ARCHITECTURE:  Lights-out was at 21:49: and lights-on at  05:01:, with  7.2 minutes of recording time . Total sleep time ( TST) was 213.5 minutes( 3.5 hours) with a decreased sleep efficiency at 49.4%.   Sleep latency was increased at 36.5 minutes.  REM sleep latency was normal at 76.0 minutes. Of the total sleep time, the percentage of stage N1 sleep was 3.5%, stage N2 sleep was 71%, stage N3 sleep was 20.6%, and REM  sleep was 4.9%.  There were 2 Stage R periods observed on this study night, 9 awakenings (i.e. transitions to Stage W from any sleep stage), and 37 total stage transitions. Wake after sleep onset (WASO) time accounted for 182 minutes (!).   BODY POSITION:  TST was divided  between the following sleep positions: supine 32 minutes (15%), non-supine 182 minutes (85%); right 00 minutes (0%), left 139 minutes (65%), and prone 42 minutes (20%).  Total supine REM sleep time was 00 minutes (0% of total REM sleep).  RESPIRATORY MONITORING: Based on AASM criteria (using a 3% oxygen desaturation and /or arousal rule for scoring hypopneas), there were 10 apneas (4 obstructive; 6 central; 0 mixed), and 19 hypopneas.  Apnea index was 2.8/h.  Hypopnea index was 5.3/h.  The AHI, or  apnea-hypopnea index, was 8.1/h overall (3.8 supine, 51 non-supine; 51.4 REM, 0.0 supine REM).  There were 0 respiratory effort-related arousals (RERAs).   OXIMETRY: Oxyhemoglobin Saturation Nadir during sleep was at  85% from a mean of 94%.  Of the Total sleep time (TST  hypoxemia (<89%) was present for  a total of 7.3 minutes, or 3.4% of total sleep time.  LIMB MOVEMENTS: There were 0 periodic limb movements of sleep (0.0/hr), of which 0 (0.0/hr) were associated with an arousal. AROUSAL: There were 29 arousals in total, for an arousal index of 8 arousals/hour.  Of these, 11 were identified as respiratory-related arousals (3 /h), 0 were PLM-related arousals (0 /h), and 20 were non-specific arousals (6 /h). EEG:  PSG EEG was of normal amplitude and frequency, with symmetric manifestation of sleep stages. EKG: The electrocardiogram documented NSR.  The average heart rate during sleep was 79 bpm.  The heart rate during sleep varied between a minimum of 68 and  a maximum of  98 bpm. AUDIO and VIDEO: no abnormal sleep activity, neither vocalization nor motor activity was captured.  IMPRESSION: 1) Sleep disordered breathing was found - at  a mild degree with an AHI under 10/h .  Complex pattern with more central than obstructive apneas and several hypopneas -all without clinically significant hypoxia. REM sleep dependent hypopneas overall.  This was not a cause of arousal and treatment is therefor optional, I will offer auto- CPAP .    2) Periodic limb movements were not seen 3) Sleep efficiency was poor - Total sleep time was reduced at 213.5 minutes.  Sleep efficiency was decreased at 49.4%.  Insomnia currently treated by PCP with Buspar. 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   RECOMMENDATIONS:  Referral to CBT, treatment of anxiety , implement sleep routines.  Intervention for apnea is optional - while many central events in this very mild apnea are not likely to benefit from CPAP, CPAP may help to reduce the sleep related headaches and REM hypopnea.  CPAP will not treat insomnia. Weight loss remains the apnea treatment of choice, either by surgical means, or  by medication.     Melvyn Novas, MD            General Information  Name: Keidra, Doose BMI: 78.29 Physician: Melvyn Novas, MD  ID: [id number] Height: 64.0 in Technician: Domingo Cocking, RPSGT  Sex: Female Weight: 306.0 lb Record: [id number]  Age: 45 [[birth year]] Date: 12/02/2022    Medical & Medication History    MARYBETH ZUMBRUNNEN is a 32 year old African American female patient seen here for her sleep struggles. the patient states that she will do everything she can and around 8:30/9 pm wind down to get ready for bed. she states most nights its 1-3 am before she is able to fall asleep. she wakes up around 7 am. she has never had a SS and does snore in sleep. She states that on average she goes to bed about 8.30 - but is asleep no earlier than 10/11 pm and may wake up 2-3 am and may be up 1-2 hrs and then she will fall back  asleep and sleep til 6:30 am. -Complains of still feeling extremely tired during the day. She has seen dr Marjory Lies in 2021, had consult and cervical spine MRI with him, not EMG nor NCV. Referral from [facility], DO at the weight loss clinic., less than 3 years ago and she is therefor a [facility] - She didn't follow up for sleep studies.  Tylenol, Lipitor, Buspar, Zyrtec, Flonase, Hydrodiuril, Miralax, Zoloft   Sleep Disorder      Comments   Patient arrived for a SPLIT (at 10 AHI) polysomnogram. Procedure explained and all questions answered. Patient shown CPAP at 5cm with a small Solo nasal cushion, a small/wide AirFit F40 full face mask, and a small Eson 2 nasal mask in preparation for SPLIT night protocol. Patient preferred the S/W AirFit F40 FFM. Standard paste setup without complications. Patient slept left, right, prone, and supine. Occasional mild snoring was heard. Mild respiratory events observed. No obvious cardiac arrhythmias observed. No significant PLMS observed. One restroom visit.    Lights out: 09:49:27 PM Lights on: 05:01:52 AM   Time Total Supine Side Prone Upright  Recording (TRT) 7h 12.14m 1h 11.7m 5h 12.45m 0h 48.48m 0h 0.52m  Sleep (TST) 3h 33.79m 0h 32.23m 2h 19.92m 0h 42.84m 0h 0.27m   Latency N1 N2 N3 REM Onset Per. Slp. Eff.  Actual 2h 2.33m 0h 36.18m 0h 49.36m 1h 16.88m 0h 36.23m 0h 36.83m 49.36%   Stg Dur Wake N1 N2 N3 REM  Total 182.5 7.5 151.5 44.0 10.5  Supine 21.0 1.5 9.0 21.5 0.0  Side 155.0 3.5 103.0 22.5 10.5  Prone 6.5 2.5 39.5 0.0 0.0  Upright 0.0 0.0 0.0 0.0 0.0   Stg % Wake N1 N2 N3 REM  Total 46.1 3.5 71.0 20.6 4.9  Supine 5.3 0.7 4.2 10.1 0.0  Side 39.1 1.6 48.2 10.5 4.9  Prone 1.6 1.2 18.5 0.0 0.0  Upright 0.0 0.0 0.0 0.0 0.0     Apnea Summary Sub Supine Side Prone Upright  Total 10 Total 10 0 10 0 0    REM 3 0 3 0 0    NREM 7 0 7 0 0  Obs 4 REM 2 0 2 0 0    NREM 2 0 2 0 0  Mix 0 REM 0 0 0 0 0    NREM 0 0 0 0 0  Cen 6 REM 1 0 1 0 0    NREM 5 0 5 0 0    Rera  Summary Sub Supine Side Prone Upright  Total 0 Total 0 0 0 0 0    REM 0 0 0 0 0    NREM 0 0 0 0 0   Hypopnea Summary Sub Supine Side Prone Upright  Total 19 Total 19 2 16 1  0    REM 6 0 6 0 0    NREM 13 2 10 1  0   4% Hypopnea Summary Sub Supine Side Prone Upright  Total (4%) 14 Total 14 2 12  0 0    REM 5 0 5 0 0    NREM 9 2 7  0 0     AHI Total Obs Mix Cen  8.15 at 3 % Apnea 2.81 1.12 0.00 1.69   Hypopnea 5.34 -- -- --  6.74 Hypopnea (4%) 3.93 -- -- --    Total Supine Side Prone Upright  Position AHI 8.15 3.75 11.18 1.43 0.00  REM AHI 51.43   NREM AHI 5.91   Position RDI 8.15 3.75 11.18 1.43 0.00  REM RDI 51.43   NREM RDI 5.91    4% Hypopnea Total Supine Side Prone Upright  Position AHI (4%) 6.74 3.75 9.46 0.00 0.00  REM AHI (4%) 45.71   NREM AHI (4%) 4.73   Position RDI (4%) 6.74 3.75 9.46 0.00 0.00  REM RDI (4%) 45.71   NREM RDI (4%) 4.73    Desaturation Information Threshold: 2% <100% <90% <80% <70% <60% <50% <40%  Supine 29.0 4.0 0.0 0.0 0.0 0.0 0.0  Side 177.0 15.0 0.0 0.0 0.0 0.0 0.0  Prone 18.0 2.0 0.0 0.0 0.0 0.0 0.0  Upright 0.0 0.0 0.0 0.0 0.0 0.0 0.0  Total 224.0 21.0 0.0 0.0 0.0 0.0 0.0  Index 33.9 3.2 0.0 0.0 0.0 0.0 0.0   Threshold: 3% <100% <90% <80% <70% <60% <50% <40%  Supine 21.0 4.0 0.0 0.0 0.0 0.0 0.0  Side 115.0 15.0 0.0 0.0 0.0 0.0 0.0  Prone 5.0 2.0 0.0 0.0 0.0 0.0 0.0  Upright 0.0 0.0 0.0 0.0 0.0 0.0 0.0  Total 141.0 21.0 0.0 0.0 0.0 0.0 0.0  Index 21.4 3.2 0.0 0.0 0.0 0.0 0.0   Threshold: 4% <100% <90% <80% <70% <60% <50% <40%  Supine 16.0 4.0 0.0 0.0 0.0 0.0 0.0  Side 82.0 15.0 0.0 0.0 0.0 0.0 0.0  Prone 3.0 2.0 0.0 0.0 0.0 0.0 0.0  Upright 0.0 0.0 0.0 0.0 0.0 0.0 0.0  Total 101.0 21.0 0.0 0.0 0.0 0.0 0.0  Index 15.3 3.2 0.0 0.0 0.0 0.0 0.0   Threshold: 4% <100% <90% <80% <70% <60% <50% <40%  Supine 16 4 0 0 0 0 0  Side 82 15 0 0 0 0 0  Prone 3 2 0 0 0 0 0  Upright 0 0 0 0 0 0 0  Total 101 21 0 0 0 0 0    Awakening/Arousal Information # of Awakenings 9  Wake after sleep onset 182.34m  Wake after persistent sleep 182.32m   Arousal Assoc. Arousals Index  Apneas 5 1.4  Hypopneas 6 1.7  Leg Movements 2 0.6  Snore 0 0.0  PTT Arousals 0 0.0  Spontaneous 20 5.6  Total 33 9.3  Leg Movement Information PLMS LMs Index  Total LMs during PLMS 0 0.0  LMs w/ Microarousals 0 0.0   LM LMs Index  w/ Microarousal 2 0.6  w/ Awakening 0 0.0  w/ Resp Event 0 0.0  Spontaneous 0 0.0  Total 2 0.6     Desaturation  threshold setting: 4% Minimum desaturation setting: 10 seconds SaO2 nadir: 54% The longest event was a 33 sec obstructive Hypopnea with a minimum SaO2 of 92%. The lowest SaO2 was 85% associated with a 17 sec obstructive Hypopnea. EKG Rates EKG Avg Max Min  Awake 81 105 70  Asleep 79 98 68

## 2022-12-11 NOTE — Telephone Encounter (Signed)
Misty Walsh, RMA  [name], [name]; [name], [name] [birth year] New orders been placed for this Patient

## 2022-12-25 ENCOUNTER — Other Ambulatory Visit: Payer: Self-pay | Admitting: Neurology

## 2022-12-25 DIAGNOSIS — E662 Morbid (severe) obesity with alveolar hypoventilation: Secondary | ICD-10-CM

## 2022-12-25 DIAGNOSIS — R519 Headache, unspecified: Secondary | ICD-10-CM

## 2022-12-25 DIAGNOSIS — E669 Obesity, unspecified: Secondary | ICD-10-CM

## 2022-12-25 DIAGNOSIS — F5105 Insomnia due to other mental disorder: Secondary | ICD-10-CM

## 2022-12-25 DIAGNOSIS — G4730 Sleep apnea, unspecified: Secondary | ICD-10-CM

## 2022-12-26 ENCOUNTER — Other Ambulatory Visit (INDEPENDENT_AMBULATORY_CARE_PROVIDER_SITE_OTHER): Payer: Self-pay | Admitting: Internal Medicine

## 2022-12-26 ENCOUNTER — Ambulatory Visit (INDEPENDENT_AMBULATORY_CARE_PROVIDER_SITE_OTHER): Payer: Managed Care, Other (non HMO) | Admitting: Internal Medicine

## 2022-12-26 ENCOUNTER — Encounter (INDEPENDENT_AMBULATORY_CARE_PROVIDER_SITE_OTHER): Payer: Self-pay | Admitting: Internal Medicine

## 2022-12-26 VITALS — BP 164/99 | HR 86 | Temp 98.1°F | Ht 64.0 in | Wt 302.0 lb

## 2022-12-26 DIAGNOSIS — E1169 Type 2 diabetes mellitus with other specified complication: Secondary | ICD-10-CM

## 2022-12-26 DIAGNOSIS — E7800 Pure hypercholesterolemia, unspecified: Secondary | ICD-10-CM

## 2022-12-26 DIAGNOSIS — I10 Essential (primary) hypertension: Secondary | ICD-10-CM

## 2022-12-26 DIAGNOSIS — E662 Morbid (severe) obesity with alveolar hypoventilation: Secondary | ICD-10-CM

## 2022-12-26 DIAGNOSIS — K760 Fatty (change of) liver, not elsewhere classified: Secondary | ICD-10-CM

## 2022-12-26 DIAGNOSIS — G4730 Sleep apnea, unspecified: Secondary | ICD-10-CM | POA: Diagnosis not present

## 2022-12-26 DIAGNOSIS — E119 Type 2 diabetes mellitus without complications: Secondary | ICD-10-CM | POA: Insufficient documentation

## 2022-12-26 DIAGNOSIS — Z6841 Body Mass Index (BMI) 40.0 and over, adult: Secondary | ICD-10-CM

## 2022-12-26 DIAGNOSIS — E669 Obesity, unspecified: Secondary | ICD-10-CM

## 2022-12-26 DIAGNOSIS — Z7985 Long-term (current) use of injectable non-insulin antidiabetic drugs: Secondary | ICD-10-CM

## 2022-12-26 MED ORDER — TIRZEPATIDE 2.5 MG/0.5ML ~~LOC~~ SOAJ
2.5000 mg | SUBCUTANEOUS | 0 refills | Status: DC
Start: 2022-12-26 — End: 2023-12-01

## 2022-12-26 NOTE — Progress Notes (Signed)
Office: [phone]  /  Fax: [phone]  WEIGHT SUMMARY AND BIOMETRICS  Vitals Temp: 98.1 F (36.7 C) BP: (!) 164/99 Pulse Rate: 86 SpO2: 96 %   Anthropometric Measurements Height: 5\' 4"  (1.626 m) Weight: (!) 302 lb (137 kg) BMI (Calculated): 51.81 Weight at Last Visit: 305 lb Weight Lost Since Last Visit: 3 lb Weight Gained Since Last Visit: 0 lb Starting Weight: 305 lb Total Weight Loss (lbs): 0 lb (0 kg) Peak Weight: 310 lb   Body Composition  Body Fat %: 51.2 % Fat Mass (lbs): 154.8 lbs Muscle Mass (lbs): 140.2 lbs Total Body Water (lbs): 102.6 lbs Visceral Fat Rating : 17    RMR: 2102  Today's Visit #: 2  Starting Date: 11/20/22   HPI  Chief Complaint: OBESITY  Misty Walsh is here to discuss her progress with her obesity treatment plan. She is on the the Category 3 Plan and states she is following her eating plan approximately 50% of the time. She states she is exercising 15-30 minutes 3 times per week.  Interval History:   Discussed the use of AI scribe software for clinical note transcription with the patient, who gave verbal consent to proceed.  History of Present Illness   The patient, with a history of uncontrolled hypertension, hypercholesterolemia, and suspected sleep apnea, presents for a follow-up visit for weight management. Since the last visit, the patient has lost three pounds. The patient reports having a home blood pressure machine and notes that recent readings have been around 130/88-90. However, during this visit, the blood pressure was significantly elevated. The patient denies any recent changes to her blood pressure medications and reports a recent visit to a hypertension clinic where a new medication was prescribed.  The patient acknowledges being an anxious individual and admits to struggling with stress eating. She is currently on Buspirone and Zoloft for anxiety management. The patient reports making dietary changes, including  increased consumption of fruits and vegetables and reduced intake of sodas. However, she expresses difficulty with eating breakfast due to feelings of nausea and lack of appetite in the morning.  The patient has a history of prediabetes and elevated cholesterol, and suspects a deficiency in vitamin D due to feelings of muscle fatigue. She reports a previous diagnosis of controlled diabetes and was on Victoza for prediabetes and weight loss. However, recent blood work indicates an A1C of 6.5, suggesting the patient has now progressed to diabetes.  The patient also reports having a sleep study done recently due to suspected sleep apnea. The results indicated a mild degree of sleep apnea with more central than obstructive apneas. The patient is awaiting approval for a CPAP machine.  The patient has been engaging in physical activity, including using a treadmill and a squat machine, for about 15-30 minutes three times per week. However, she reports experiencing aching in her arms and shoulders.     TOrexigenic Control: Reports problems with appetite and hunger signals.  Denies problems with satiety and satiation.  Denies problems with eating patterns and portion control.  Reports abnormal cravings. Denies feeling deprived or restricted.   Barriers identified: strong hunger signals and impaired satiety / inhibitory control, low volume of physical activity at present , and sleep apnea.   Pharmacotherapy for weight loss: She is currently taking no anti-obesity medication.    ASSESSMENT AND PLAN  TREATMENT PLAN FOR OBESITY:  Recommended Dietary Goals  Misty Walsh is currently in the action stage of change. As such, her goal is to continue  weight management plan. She has agreed to: continue current plan  Behavioral Intervention  We discussed the following Behavioral Modification Strategies today: continue to work on maintaining a reduced calorie state, getting the recommended amount of protein,  incorporating whole foods, making healthy choices, staying well hydrated and practicing mindfulness when eating..  Additional resources provided today: None  Recommended Physical Activity Goals  Misty Walsh has been advised to work up to 150 minutes of moderate intensity aerobic activity a week and strengthening exercises 2-3 times per week for cardiovascular health, weight loss maintenance and preservation of muscle mass.   She has agreed to :  Think about enjoyable ways to increase daily physical activity and overcoming barriers to exercise, Increase physical activity in their day and reduce sedentary time (increase NEAT)., and continue to gradually increase the amount and intensity of exercise   Pharmacotherapy We discussed various medication options to help Misty Walsh with her weight loss efforts and we both agreed to : start anti-obesity medication.  In addition to reduced calorie nutrition plan (RCNP), behavioral strategies and physical activity, Misty Walsh would benefit from pharmacotherapy to assist with hunger signals, satiety and cravings. This will reduce obesity-related health risks by inducing weight loss, and help reduce food consumption and adherence to [facility]) . It may also improve QOL by improving self-confidence and reduce the  setbacks associated with metabolic adaptations.  This patient has progressed from prediabetes to diabetes she also has multiple high risk comorbid conditions that increase her risk for cardiovascular disease including uncontrolled hypertension, sleep apnea, hypercholesterolemia and MASLD.  It appears that her insurance does not cover Ozempic or Mounjaro, both drugs effective in weight management and may also benefit hepatic steatosis and reduce her cardiovascular risk unlike what is available on her formulary.  After discussion of treatment options, mechanisms of action, benefits, side effects, contraindications and shared decision making she is agreeable to starting  Mounjaro 2.5 mg once a week with the primary indication of type 2 diabetes. Patient also made aware that medication is indicated for long-term management of obesity and the risk of weight regain following discontinuation of treatment and hence the importance of adhering to medical weight loss plan.  We demonstrated use of device and patient using teach back method was able to demonstrate proper technique.  ASSOCIATED CONDITIONS ADDRESSED TODAY  Assessment and Plan    Obesity   She has lost three pounds since her last visit, demonstrating progress in weight management. Obesity has been identified as a contributing factor to her hypertension, hypercholesterolemia, diabetes, and sleep apnea. We discussed the critical role of weight loss in improving these conditions and emphasized the importance of a balanced diet and regular physical activity. We introduced Mounjaro, a once-weekly injection, as a new option for weight management and diabetes control, explaining that a 15% weight loss could significantly improve sleep apnea and other comorbidities. We will prescribe Mounjaro 2.5 mg once weekly and send the prescription to [facility]. A follow-up in three weeks is scheduled to monitor progress and side effects.  Diabetes   Her A1c has slightly increased from 6.4 in April to 6.5, indicating diabetes.  Her fasting blood sugar was 123.  We discussed managing blood sugar through diet, exercise, and medication, specifically introducing Mounjaro for diabetes management. This medication not only aids in weight loss and appetite suppression but also reduces cardiovascular risk and improves blood sugar control. We recommended an annual eye exam, urine test for protein, and foot exam for neuropathy to monitor for diabetes-related  complications. We will prescribe Mounjaro for diabetes management and encourage a follow-up with her PCP for ongoing diabetes management.  Hypertension   She exhibits uncontrolled  hypertension with labile patterns, with home readings ranging from 130-138/88-90 mmHg and an office reading of 162/97 mmHg, despite being on Cozaar. We discussed the need for better control and the potential requirement for multiple medications, explaining that each medication could lower blood pressure by 7-10 points. We advised home monitoring of blood pressure twice daily for 48 hours and contacting the hypertension clinic for medication adjustment if readings remain high.  Patient also has a short provide arm which makes measuring blood pressure somewhat difficult with a thigh cuff.  We therefore were performing blood pressure measurements at the wrist which may affect readings.  Hypercholesterolemia   She is currently on atorvastatin for hypercholesterolemia. We discussed the importance of continuing this medication and monitoring cholesterol levels, advising her to continue atorvastatin as prescribed.  Sleep Apnea   She has mild sleep apnea with 10 events per hour, more central than obstructive apneas, and has been referred for CPAP. We discussed how weight loss could improve sleep apnea and explained that a 15% weight loss could improve or resolve symptoms. We encouraged her to follow up with the vendor for the CPAP machine and to pursue weight loss to improve sleep apnea symptoms.  General Health Maintenance   We discussed the importance of a healthy diet, regular physical activity, and routine health screenings, emphasizing vaccinations and regular check-ups. We encouraged a balanced diet with more vegetables and fruits, recommended gradual progression of her physical activity  MASLD Detected on CT from 2023 of abdomen and pelvis.  Her liver enzymes and platelet count are within normal limits.  Fibrosis 4 Score = .78    We discussed the importance of maintaining a diet low in saturated fats and simple carbs.  Losing 15% of body weight may improve condition.  She is also to avoid alcohol.  She  also benefits from GLP-1 therapy.  Follow-up   A follow-up in three weeks is scheduled to monitor the progress and side effects of Mounjaro. We will recheck her blood pressure before she leaves the office and encouraged her to contact the hypertension clinic if her blood pressure remains high.        PHYSICAL EXAM:  Blood pressure (!) 164/99, pulse 86, temperature 98.1 F (36.7 C), height 5\' 4"  (1.626 m), weight (!) 302 lb (137 kg), SpO2 96%. Body mass index is 51.84 kg/m.  General: She is overweight, cooperative, alert, well developed, and in no acute distress. PSYCH: Has normal mood, affect and thought process.   HEENT: EOMI, sclerae are anicteric. Lungs: Normal breathing effort, no conversational dyspnea. Extremities: No edema.  Neurologic: No gross sensory or motor deficits. No tremors or fasciculations noted.    DIAGNOSTIC DATA REVIEWED:  BMET    Component Value Date/Time   NA 134 (L) 12/04/2022 1357   NA 139 11/20/2022 0930   K 4.1 12/04/2022 1357   CL 104 12/04/2022 1357   CO2 20 (L) 12/04/2022 1357   GLUCOSE 178 (H) 12/04/2022 1357   BUN 8 12/04/2022 1357   BUN 12 11/20/2022 0930   CREATININE 0.91 12/04/2022 1357   CALCIUM 8.9 12/04/2022 1357   GFRNONAA >60 12/04/2022 1357   GFRAA 127 11/03/2019 1152   Lab Results  Component Value Date   HGBA1C 6.5 (H) 11/20/2022   HGBA1C 5.1 09/16/2012   Lab Results  Component Value Date  INSULIN 41.9 (H) 11/20/2022   INSULIN 29.5 (H) 11/03/2019   Lab Results  Component Value Date   TSH 2.850 11/20/2022   CBC    Component Value Date/Time   WBC 6.4 12/04/2022 1357   RBC 4.27 12/04/2022 1357   HGB 13.6 12/04/2022 1357   HGB 13.5 11/20/2022 0930   HCT 39.9 12/04/2022 1357   HCT 41.8 11/20/2022 0930   PLT 246 12/04/2022 1357   PLT 233 11/20/2022 0930   MCV 93.4 12/04/2022 1357   MCV 95 11/20/2022 0930   MCH 31.9 12/04/2022 1357   MCHC 34.1 12/04/2022 1357   RDW 12.1 12/04/2022 1357   RDW 12.3 11/20/2022 0930    Iron Studies No results found for: "IRON", "TIBC", "FERRITIN", "IRONPCTSAT" Lipid Panel     Component Value Date/Time   CHOL 199 11/20/2022 0930   TRIG 103 11/20/2022 0930   HDL 52 11/20/2022 0930   LDLCALC 128 (H) 11/20/2022 0930   Hepatic Function Panel     Component Value Date/Time   PROT 7.0 12/04/2022 1357   PROT 7.0 11/20/2022 0930   ALBUMIN 3.8 12/04/2022 1357   ALBUMIN 4.3 11/20/2022 0930   AST 33 12/04/2022 1357   ALT 32 12/04/2022 1357   ALKPHOS 76 12/04/2022 1357   BILITOT 0.9 12/04/2022 1357   BILITOT 0.4 11/20/2022 0930      Component Value Date/Time   TSH 2.850 11/20/2022 0930   Nutritional Lab Results  Component Value Date   VD25OH 17.9 (L) 11/20/2022   VD25OH 17.0 (L) 05/25/2020   VD25OH 16.1 (L) 11/03/2019     Return in about 3 weeks (around 01/16/2023) for For Weight Mangement with Dr. Rikki Spearing - 40 minutes - complex, 20 minutes if 40 not available.Marland Kitchen She was informed of the importance of frequent follow up visits to maximize her success with intensive lifestyle modifications for her multiple health conditions.   ATTESTASTION STATEMENTS:  Reviewed by clinician on day of visit: allergies, medications, problem list, medical history, surgical history, family history, social history, and previous encounter notes.   I have spent 60 minutes in the care of the patient today including: preparing to see patient (e.g. review and interpretation of tests, old notes ), obtaining and/or reviewing separately obtained history, performing a medically appropriate examination or evaluation, counseling and educating the patient, ordering medications, test or procedures, documenting clinical information in the electronic or other health care record, and independently interpreting results and communicating results to the patient, family, or caregiver   Worthy Rancher, MD

## 2022-12-30 ENCOUNTER — Telehealth (INDEPENDENT_AMBULATORY_CARE_PROVIDER_SITE_OTHER): Payer: Self-pay | Admitting: Internal Medicine

## 2022-12-30 NOTE — Telephone Encounter (Signed)
I called the patient, she stated her next visit is not until 3 weeks. Per patient, she stated at her last OV you all discussed the possibility of her insurance giving some push back but you would write a letter or try to get it pushed through for her. She has been on Victoza before, but that did not work well for her. She also has no issue giving herself a daily injection.

## 2022-12-30 NOTE — Telephone Encounter (Signed)
Request created in Cover my meds for Prior Auth of Mounjaro.  Awaiting questions.

## 2022-12-30 NOTE — Telephone Encounter (Signed)
Rosann Auerbach called stating a PA needs to be completed for the [facility] 2.5mg . Phone number is [phone] or vist CoverMy Meds. Thanks

## 2022-12-31 ENCOUNTER — Telehealth (INDEPENDENT_AMBULATORY_CARE_PROVIDER_SITE_OTHER): Payer: Self-pay

## 2022-12-31 NOTE — Telephone Encounter (Signed)
Insurance denied Misty Walsh - needs to try Byetta or Trulicity (both may need a PA).  Pt's next visit is 12/5.

## 2022-12-31 NOTE — Telephone Encounter (Signed)
Dear Lurline Idol [facility], I have reviewed the request to cover Mounjaro 2.5 mg/0.5 PEN INJCTR. The information submitted  did not meet the criteria necessary to approve this medication. Based on the information provided, I am unable to approve coverage for this medication because: There is nothing to support that the individual has contraindication, or is intolerant to one of the  following covered alternatives: A. Byetta/Bydureon [may require prior authorization]; and B. Trulicity  [may require prior authorization]. There is no indication that your patient has met both of the following: A) Individual will continue  maximally tolerated metformin therapy, if not contraindicated, intolerant, or otherwise not a  candidate; and B) Documentation of one of the following: 1. Unable to achieve goal HbA1C despite  metformin or metformin-containing regimen (meglitinides, sulfonylureas, or thiazolidinediones) at  greater than or equal to 1,500 mg per day; 2. Intolerance to metformin 1,500 mg per day despite  appropriate dose titration duration (for example, period of 8-12 weeks); 3. Contraindication to  metformin per FDA label (for example, acute/chronic metabolic acidosis, severe renal dysfunction);  4. Not a candidate for metformin (for example, hepatic impairment, moderate renal dysfunction,  [employer] on behalf of [address], [place]. unstable heart failure, individual is using an agent for a non-diabetic FDA-approved indication); 5.  Initial metformin combination therapy is clinically appropriate for elevated HbA1C (for example,  greater than 1.5% above goal); or 6. Initial metformin combination therapy is clinically appropriate  in an individual with co-morbid conditions (such as ASCVD, heart failure, or CKD). Greggory Keen is considered medically necessary when all of the following are met: 1. Documented  diagnosis of Type 2 diabetes mellitus; 2. Both of the  following: A. Documentation of one the  following: i. Unable to achieve goal HbA1C despite metformin or metformin-containing regimen  (meglitinides, sulfonylureas, or thiazolidinediones) at greater than or equal to 1,500 mg per day; ii.  Intolerance to metformin 1,500 mg per day despite appropriate dose titration duration (for example,  period of 8-12 weeks); iii. Contraindication to metformin per FDA label (for example, acute/chronic  metabolic acidosis, severe renal dysfunction); iv. Not a candidate for metformin (for example,  hepatic impairment, moderate renal dysfunction, unstable heart failure, individual is using an agent  for a non-diabetic FDA-approved indication); v. Initial metformin combination therapy is clinically  appropriate for elevated HbA1C (for example, HbA1C greater than 1.5% above goal); or vi. Initial  metformin combination therapy is clinically appropriate in an individual with co-morbid conditions  (such as ASCVD, heart failure, or CKD); and B. Individual will continue maximally tolerated  metformin therapy, if not contraindicated, intolerant, or otherwise not a candidate; and 3. The  individual has documented contraindication, or is intolerant to one of the following: A.  Byetta/Bydureon [may require prior authorization]; or B. Trulicity [may require prior authorization].  Rosann Auerbach does not cover Mounjaro for any other indication because it is only approved for type 2  diabetes mellitus

## 2022-12-31 NOTE — Telephone Encounter (Signed)
Prior auth submitted for [employer].  Awaiting determination.

## 2023-01-07 ENCOUNTER — Encounter (INDEPENDENT_AMBULATORY_CARE_PROVIDER_SITE_OTHER): Payer: Self-pay | Admitting: *Deleted

## 2023-01-23 ENCOUNTER — Ambulatory Visit (INDEPENDENT_AMBULATORY_CARE_PROVIDER_SITE_OTHER): Payer: Managed Care, Other (non HMO) | Admitting: Internal Medicine

## 2023-03-03 ENCOUNTER — Ambulatory Visit (HOSPITAL_BASED_OUTPATIENT_CLINIC_OR_DEPARTMENT_OTHER): Payer: Managed Care, Other (non HMO) | Admitting: Cardiology

## 2023-03-04 ENCOUNTER — Ambulatory Visit (INDEPENDENT_AMBULATORY_CARE_PROVIDER_SITE_OTHER): Payer: Self-pay | Admitting: Internal Medicine

## 2023-04-29 ENCOUNTER — Encounter (INDEPENDENT_AMBULATORY_CARE_PROVIDER_SITE_OTHER): Payer: Self-pay

## 2023-05-02 NOTE — Progress Notes (Signed)
 Subjective Patient ID: Misty Walsh is a 33 y.o. female.  Chief Complaint  Patient presents with  . Flu Symptoms    Patient complaining of fever, chills, body aches, runny nose since Monday. Patient has taken Tylenol , cold and flu medication, zinc, teas with no relief. Last dose of Tylenol  cold and flu at 4pm.   . Urinary Problem    Patient complaining of pain and pressure while urinating for one week.     The following information was reviewed by members of the visit team:  Tobacco  Allergies  Meds  Problems  Med Hx  Surg Hx  OB Status   Fam Hx      Patient is a 33 year old female presenting today for 2 complaints.  First, she has been having suprapubic pressure and dysuria at the end of voiding for about the past week.  Second, over the past few days, she has developed new onset of general malaise, fever, chills, congestion, rhinorrhea, minimal cough.  She has tried over-the-counter medications for help with symptoms with no improvement.  Reports recent sick contacts.  Flu Symptoms Associated symptoms include chills, congestion, a fever and myalgias. Pertinent negatives include no abdominal pain, chest pain, coughing, headaches, nausea, sore throat or vomiting.    Review of Systems  Constitutional:  Positive for chills and fever.  HENT:  Positive for congestion and rhinorrhea. Negative for ear pain, postnasal drip, sinus pressure, sinus pain and sore throat.   Respiratory:  Negative for cough and shortness of breath.   Cardiovascular:  Negative for chest pain.  Gastrointestinal:  Negative for abdominal pain, nausea and vomiting.  Genitourinary:  Positive for dysuria, frequency and urgency. Negative for flank pain, hematuria, menstrual problem, pelvic pain, vaginal bleeding and vaginal discharge.  Musculoskeletal:  Positive for myalgias. Negative for back pain.  Neurological:  Negative for headaches.    Objective Vitals:   05/02/23 1805  BP: (!) 168/92  Pulse: 100   Resp: 20  Temp: 99.9 F (37.7 C)  TempSrc: Tympanic  SpO2: 100%  Weight: 136 kg (300 lb)  Height: 1.613 m (5' 3.5)    Physical Exam Vitals and nursing note reviewed.  Constitutional:      General: She is in acute distress (appears uncomfortable, complaining of myalgias).     Appearance: She is obese. She is not ill-appearing or toxic-appearing.  HENT:     Right Ear: External ear normal.     Left Ear: External ear normal.     Nose: Congestion present. No rhinorrhea.  Eyes:     Conjunctiva/sclera: Conjunctivae normal.  Cardiovascular:     Rate and Rhythm: Normal rate and regular rhythm.     Heart sounds: Normal heart sounds.  Pulmonary:     Effort: Pulmonary effort is normal. No respiratory distress.     Breath sounds: Normal breath sounds.  Abdominal:     General: There is no distension.     Palpations: Abdomen is soft.     Tenderness: There is no abdominal tenderness. There is no right CVA tenderness, left CVA tenderness or guarding.  Musculoskeletal:     Cervical back: No rigidity.     Right lower leg: No edema.     Left lower leg: No edema.  Skin:    General: Skin is warm and dry.  Neurological:     Mental Status: She is alert.     GCS: GCS eye subscore is 4. GCS verbal subscore is 5. GCS motor subscore is 6.  Psychiatric:  Mood and Affect: Mood normal.        Behavior: Behavior normal.    Lab Results (last 24 hours)     Procedure Component Value Ref Range Date/Time   POC HCG Qualitative, Urine [[id number]] Collected: 05/02/23 1835   Lab Status: Final result Specimen: Urine from Clean Catch Updated: 05/02/23 1836    HCG, Urine, POC Negative Negative     Internal Control Acceptable    Kit/Device Lot # [id number]    Kit/Device Expiration Date 10/18/2024   POC Urinalysis Auto without Microscopic [[id number]]  (Abnormal) Collected: 05/02/23 1832   Lab Status: Final result Specimen: Urine from Clean Catch Updated: 05/02/23 1832    Color, Urine Yellow Yellow      Clarity, Urine Cloudy* Clear     Glucose, Urine Negative Negative mg/dL     Bilirubin, Urine Negative Negative     Ketones, Urine Negative Negative mg/dL     Specific Gravity, Urine 1.020 1.010, 1.015, 1.020, 1.025     Blood, Urine Negative Negative     pH, Urine 7.0 5.0, 5.5, 6.0, 6.5, 7.0, 7.5, 8.0     Protein, Urine Negative Negative mg/dL     Urobilinogen, Urine 0.2 <2.0 mg/dL     Nitrite, Urine Positive* Negative     Leukocyte Esterase, Urine Negative Negative     Kit/Device Lot # [id number]    Kit/Device Expiration Date 10/19/2023   Urine Culture [[id number]] Collected: 05/02/23 1832   Lab Status: In process Specimen: Urine from Clean Catch Updated: 05/02/23 1832   POC SARS-COV-2 SYMPTOMATIC (IDNOW) [[id number]]  (Abnormal) Collected: 05/02/23 1828   Lab Status: Final result Specimen: Swab from Nasal Cavity Updated: 05/02/23 1828    SARS-COV-2 IDNOW Positive* Negative     Comment: [id number]      SARS IDNOW Information A rapid, molecular diagnostic test on the IDNOW.  Negative results should be treated as presumptive and, if inconsistent with clinical symptoms should be confirmed with an alternative molecular assay.    Comment: 02/12/2025     POC Influenza A&B NAT (IDNOW) [[id number]] Collected: 05/02/23 1828   Lab Status: Final result Specimen: Swab from Nasal Cavity Updated: 05/02/23 1828    Influenza A RNA Negative Negative     Comment: [id number]      Influenza B RNA Negative Negative     Comment: 11/25/2024          Assessment/Plan Diagnoses and all orders for this visit:  Cystitis without hematuria -     POC Urinalysis Auto without Microscopic -     Urine Culture -     POC HCG Qualitative, Urine -     sulfamethoxazole -trimethoprim  (BACTRIM  DS) 800-160 mg per tablet; Take 1 tablet by mouth 2 (two) times a day for 5 days. -     ondansetron  (ZOFRAN -ODT) 4 mg disintegrating tablet; Dissolve 1 tablet (4 mg total) on tongue every 8 (eight) hours as needed for nausea or vomiting. -      ondansetron  (ZOFRAN ) injection 4 mg  COVID-19 -     POC SARS-COV-2 SYMPTOMATIC (IDNOW) -     POC Influenza A&B NAT (IDNOW) -     nirmatrelvir-ritonavir (Paxlovid) 300 mg (150 mg x 2)-100 mg dose pack; Take 3 tablets by mouth 2 (two) times a day for 5 days.  Elevated blood pressure reading  Other orders -     atorvastatin (LIPITOR) 80 mg tablet; Take 1 tablet by mouth daily. -     chlorthalidone  (HYGROTON ) 25 mg tablet; Take 25 mg by  mouth daily.   This is most consistent with Covid as well as UTI.  She has both URI symptoms as well as urinary symptoms.  Did consider causes such as pyelonephritis given urinary symptoms followed by fever, chills, general malaise however Covid symptoms presented at the same time as the B symptoms did so given that there is no CVA tenderness and a normal abdominal exam, I have a low index of suspicion for pyelonephritis. Due to patient's diagnosis of COVID-19, and reviewing current [facility] eligibility guidelines, patient meets criteria for antiviral medication use. I screened their current medications for interactions using the [employer]'s COVID-19 drug interactions checker (https://www.covid19-druginteractions.org/checker) and minor interactions were found, will have patient discontinue atorvastatin and hydrocodone  while she takes Paxlovid.  I also reviewed relevant lab work, if available, to assess renal function, but none available.  Using all available above information, and using shared decision making regarding pros/cons, we will start antiviral medications today.  She is stable for discharge home.  Was complaining of some nausea at time of discharge so IM Zofran  was given and p.o. Zofran  was sent to pharmacy on file in addition to antibiotics for UTI and Paxlovid for COVID.  Follow-up as needed for any worsening.  Home Care  Electronically signed: Ozell Fairy Veta DEVONNA 05/02/2023  6:37 PM  *Some images could not be shown.

## 2023-05-07 NOTE — Progress Notes (Signed)
 Subjective Patient ID: Misty Walsh is a 33 y.o. female.  Chief Complaint  Patient presents with  . Urinary Problem    Patient was seen on 3/14 and diagnosed with with the Covid and UTI. Patient stated that since taking the Bactrim  she is experiencing more pressure and abdominal pain. No new fevers. Still taking medications as prescribed.     The following information was reviewed by members of the visit team:  Tobacco  Allergies  Meds  Problems  Med Hx  Surg Hx  OB Status   Fam Hx  Soc Hx     33 yo female presents with c/o lower abd pain starting about 2-3 days ago  Patient evaluated here 3/14 for both urinary urgency and hesitancy as well as cough, congestion and other upper respiratory symptoms. Found with UTI as well as acute COVID. Treated with bactrim  which was proven to be sensitive as per urine culture as well as paxlovid for COVID.   Reports urinary symptoms seemed to improve within first couple of days, though she went on to develop lower abd pain to both right and left lower abd, most recently accompanied by onset of vag d/c yesterday. She has experienced constipation, but since taking the medications has been having regular BM's. Denies fever, chills, back and flank pain as well as N/V. Sexually active with 1 partner with inconsistent condom use.     Review of Systems  Objective Physical Exam Vitals reviewed. Exam conducted with a chaperone present.  Constitutional:      Appearance: Normal appearance. She is obese.  HENT:     Head: Normocephalic and atraumatic.     Right Ear: External ear normal.     Left Ear: External ear normal.     Nose: Nose normal.     Mouth/Throat:     Mouth: Mucous membranes are moist.     Pharynx: Oropharynx is clear.  Eyes:     Extraocular Movements: Extraocular movements intact.  Cardiovascular:     Rate and Rhythm: Normal rate and regular rhythm.     Pulses: Normal pulses.     Heart sounds: Normal heart sounds.   Pulmonary:     Effort: Pulmonary effort is normal.     Breath sounds: Normal breath sounds.  Abdominal:     Palpations: Abdomen is soft.     Tenderness: There is abdominal tenderness (slight, both RLQ and LLQ). There is no right CVA tenderness, left CVA tenderness, guarding or rebound.  Genitourinary:    Labia:        Right: No rash or lesion.        Left: No rash or lesion.      Vagina: Vaginal discharge (greenish, yellow colored, moderate amount) present.     Cervix: No cervical motion tenderness.     Uterus: Not tender.      Adnexa:        Right: No tenderness.         Left: No tenderness.    Musculoskeletal:        General: Normal range of motion.     Cervical back: Normal range of motion and neck supple.  Lymphadenopathy:     Cervical: No cervical adenopathy.  Skin:    General: Skin is warm and dry.  Neurological:     General: No focal deficit present.     Mental Status: She is alert and oriented to person, place, and time.     Assessment/Plan See below  Misty Walsh  is a 33 y.o. female is here for onset of  bilateral lower abd pain after recent treatment for UTI as well as COVID with bactrim  and paxlovid respectively.   Abd soft with minimal bilateral lower abd TTP, no evidence of acute intra-abd surgical pathology, such as appendicitis. No CVA TTP nor low back pain. Urinary symptoms seemed to have improved and culture was sensitive to bactrim . Repeat UA today with no leuks nor nitrites. Overall feel persistent/untreated UTI, pyelonephritis and kidney stones are unlikely causing her lower abd pain.   Pelvic exam remarkable for profuse vag discharge, suggestive of trichomonas and/or BV. Consider other STD's as well. Neg CMT, no evidence of PID. No adnexal TTP, doubt acute surgical GYN pathology such as TOA.   Considered diverticulitis, though patient having some pain on the right side as well and has no prior hx of known diverticulitis/diverticulosis. Also consider  constipation may be contributing to her lower abd pain as reports having trouble with constipation over prior days.  Will plan to treat vaginitis empirically with flagyl  and then treat other STDs if indicated by results. Advised patient to drink plenty of water and complete course of bactrim . Recommend close f/u with PCP.   Patient will return for new or worsening symptoms. I discussed the findings today, diagnosis/differential diagnosis, plan and red flags that require return for reevaluation with PCP, Urgent care or EMERGENCY. Patient was agreeable to outlined plan and questions were answered, felt stable for discharge.      1. Lower abdominal pain  Chlamydia / Gonococcus (GC), NAAT   Bacterial Vaginosis (BV), Candida, and Trichomonas via NAA (Swab)   POC Urinalysis Auto without Microscopic    2. Vaginal discharge        All pertinent previous records reviewed prior to and during visit.  Urgent Care Disposition:  Home Care   Requested Prescriptions   Signed Prescriptions Disp Refills  . metroNIDAZOLE  (FLAGYL ) 500 mg tablet 14 tablet 0    Sig: Take 1 tablet (500 mg total) by mouth 2 (two) times a day for 7 days.    Orders Placed This Encounter  Procedures  . Chlamydia / Gonococcus (GC), NAAT    Release to patient::   Immediate  . Bacterial Vaginosis (BV), Candida, and Trichomonas via NAA (Swab)    Release to patient::   Immediate  . POC Urinalysis Auto without Microscopic            Electronically signed: Jon Earnie Flank, NP 05/07/2023  11:57 AM

## 2023-11-28 DIAGNOSIS — E860 Dehydration: Secondary | ICD-10-CM | POA: Diagnosis not present

## 2023-11-28 DIAGNOSIS — N898 Other specified noninflammatory disorders of vagina: Secondary | ICD-10-CM | POA: Diagnosis not present

## 2023-11-28 DIAGNOSIS — N3001 Acute cystitis with hematuria: Secondary | ICD-10-CM | POA: Diagnosis not present

## 2023-11-28 DIAGNOSIS — R112 Nausea with vomiting, unspecified: Secondary | ICD-10-CM | POA: Diagnosis not present

## 2023-11-28 NOTE — Progress Notes (Signed)
 Subjective: Patient ID:  Misty Walsh is a 33 y.o. female  Chief Complaint  Patient presents with  . Abdominal Pain    Sternal to uterus pain feels like grabbing and stabbing. Pressure when urinating and discharge, also unsure if still haas covid. Tested positive last Sunday. Nausea. Had extra long period, unknown if pregnant. No b/c.    The following information was reviewed by members of the visit team:  Tobacco  Allergies  Meds  Problems  Med Hx  Surg Hx  OB Status   Fam Hx    33 year old female presents for evaluation of symptoms ongoing a few days.  She does report testing positive for covid 5 days ago and these symptoms have improved.  Today she complains of low abdominal cramping, pain with urination, yellow vaginal discharge and nausea with vomiting.  She has had difficulty eating and continues to drink fluids.  She also reports increased pain with trying to hold her urine which is similar to the symptoms she has had with UTI in the past.  She denies any new sexual partners as well. She does not appear to be in distress during this exam.  Abdominal Pain Associated symptoms include dysuria, nausea and vomiting. Pertinent negatives include no diarrhea, fever or headaches.     Review of Systems  Constitutional:  Negative for chills and fever.  Respiratory:  Negative for shortness of breath.   Cardiovascular:  Negative for chest pain.  Gastrointestinal:  Positive for abdominal pain, nausea and vomiting. Negative for diarrhea.  Genitourinary:  Positive for dysuria and vaginal discharge. Negative for pelvic pain.  Neurological:  Negative for dizziness and headaches.      Objective  Vitals:   11/28/23 1716  BP: (!) 151/110  Pulse: 95  Resp: 18  Temp: 97.1 F (36.2 C)  TempSrc: Tympanic  SpO2: 99%  Weight: 136 kg (300 lb)  Height: 1.6 m (5' 3)    Patient's last menstrual period was 11/17/2023 (exact date).   Behavioral Health Screening  Patient Health  Questionnaire-2 Score: 0 (11/28/2023  5:16 PM)      Patient's Depression screening is Negative   Depression Plan: Normal/Negative Screening  Physical Exam Vitals and nursing note reviewed.  Constitutional:      Appearance: Normal appearance. She is obese.  Cardiovascular:     Rate and Rhythm: Normal rate and regular rhythm.     Heart sounds: Normal heart sounds.  Pulmonary:     Effort: Pulmonary effort is normal.     Breath sounds: Normal breath sounds.  Abdominal:     General: Bowel sounds are normal.     Palpations: Abdomen is soft.     Tenderness: There is no abdominal tenderness. There is no right CVA tenderness or left CVA tenderness.  Skin:    General: Skin is warm and dry.     Capillary Refill: Capillary refill takes 2 to 3 seconds.  Neurological:     General: No focal deficit present.     Mental Status: She is alert and oriented to person, place, and time.  Psychiatric:        Mood and Affect: Mood normal.        Behavior: Behavior normal.      Recent Results (from the past 24 hours)  POC Urinalysis Auto without Microscopic   Collection Time: 11/28/23  5:35 PM  Result Value Ref Range   Color, Urine Yellow Yellow   Clarity, Urine Clear Clear   Glucose, Urine Negative  Negative mg/dL   Bilirubin, Urine Small (A) Negative   Ketones, Urine Negative Negative mg/dL   Specific Gravity, Urine >=1.030 (A) 1.010, 1.015, 1.020, 1.025   Blood, Urine Moderate (A) Negative   pH, Urine 5.5 5.0, 5.5, 6.0, 6.5, 7.0, 7.5, 8.0   Protein, Urine >300 (A) Negative mg/dL   Urobilinogen, Urine 0.2 <2.0 mg/dL   Nitrite, Urine Positive (A) Negative   Leukocyte Esterase, Urine Large (A) Negative   Kit/Device Lot # [id number]    Kit/Device Expiration Date 08/17/2024   POC HCG Qualitative, Urine   Collection Time: 11/28/23  5:37 PM  Result Value Ref Range   HCG, Urine, POC Negative Negative   Internal Control Acceptable    Specific Gravity, Urine >=1.030 (A) 1.010, 1.015, 1.020, 1.025    Kit/Device Lot # [id number]    Kit/Device Expiration Date 10/18/2024   Chlamydia / Gonococcus (GC), NAAT   Collection Time: 11/28/23  5:54 PM  Result Value Ref Range   Chlamydia (CT) Negative Negative   Gonorrhea (GC) Negative Negative     Radiologist interpretation:   No orders to display     Assessment/Plan   Misty Walsh was seen today for abdominal pain.  Diagnoses and all orders for this visit:  Nausea and vomiting, unspecified vomiting type -     POC HCG Qualitative, Urine -     POC Urinalysis Auto without Microscopic -     ondansetron  (ZOFRAN -ODT) disintegrating tablet 4 mg -     ondansetron  (ZOFRAN -ODT) 4 mg disintegrating tablet; Dissolve 1 tablet (4 mg total) on tongue every 8 (eight) hours as needed for nausea or vomiting.  Vaginal discharge -     Bacterial Vaginosis (BV), Candida, and Trichomonas via NAA (Swab) -     Chlamydia / Gonococcus (GC), NAAT  Acute cystitis with hematuria -     Urine Culture -     cefdinir  (OMNICEF ) 300 mg capsule; Take 1 capsule (300 mg total) by mouth 2 (two) times a day for 7 days.  Mild dehydration    Patient has been instructed on RX/ OTC medications, dosages, side effects, and possible interactions as associated with each diagnosis in my impression and plan above.  2.   Patient education (verbal/handout) given on diagnosis, pathophysiology, treatment of diagnosis, side effects of medication use for treatment, restrictions while taking medication.  Supportive       Care measures as directed on AVS.  Red Flags associated with diagnosis/es were reviewed and patient instructed on action plan if red flags develop.  3.   Urgent Care Disposition:  Follow up with PCP       They have been instructed that if symptoms worse that should go to Urgent Care, go to the nearest ED, or activate EMS.  4.   Patient agreed with plan and voiced understanding.  NO barriers to adherence perceived by myself.  Electronically signed: Rosaline Jama Collet FNP   Dju 11/29/2023 12:41 PM

## 2023-12-01 ENCOUNTER — Other Ambulatory Visit: Payer: Self-pay

## 2023-12-01 ENCOUNTER — Encounter (HOSPITAL_COMMUNITY): Payer: Self-pay

## 2023-12-01 ENCOUNTER — Emergency Department (HOSPITAL_COMMUNITY): Payer: MEDICAID

## 2023-12-01 ENCOUNTER — Inpatient Hospital Stay (HOSPITAL_COMMUNITY)
Admission: EM | Admit: 2023-12-01 | Discharge: 2023-12-23 | DRG: 853 | Disposition: A | Discharge status: Home / Self Care | Payer: MEDICAID | Attending: Obstetrics & Gynecology | Admitting: Obstetrics & Gynecology

## 2023-12-01 DIAGNOSIS — Z86711 Personal history of pulmonary embolism: Secondary | ICD-10-CM | POA: Diagnosis not present

## 2023-12-01 DIAGNOSIS — A419 Sepsis, unspecified organism: Secondary | ICD-10-CM

## 2023-12-01 DIAGNOSIS — Z7984 Long term (current) use of oral hypoglycemic drugs: Secondary | ICD-10-CM | POA: Diagnosis not present

## 2023-12-01 DIAGNOSIS — E119 Type 2 diabetes mellitus without complications: Secondary | ICD-10-CM

## 2023-12-01 DIAGNOSIS — F418 Other specified anxiety disorders: Secondary | ICD-10-CM | POA: Diagnosis not present

## 2023-12-01 DIAGNOSIS — N10 Acute tubulo-interstitial nephritis: Secondary | ICD-10-CM | POA: Diagnosis not present

## 2023-12-01 DIAGNOSIS — Z9889 Other specified postprocedural states: Secondary | ICD-10-CM | POA: Diagnosis not present

## 2023-12-01 DIAGNOSIS — R109 Unspecified abdominal pain: Secondary | ICD-10-CM | POA: Diagnosis not present

## 2023-12-01 DIAGNOSIS — Z7985 Long-term (current) use of injectable non-insulin antidiabetic drugs: Secondary | ICD-10-CM

## 2023-12-01 DIAGNOSIS — K219 Gastro-esophageal reflux disease without esophagitis: Secondary | ICD-10-CM | POA: Diagnosis present

## 2023-12-01 DIAGNOSIS — I2690 Septic pulmonary embolism without acute cor pulmonale: Secondary | ICD-10-CM | POA: Diagnosis not present

## 2023-12-01 DIAGNOSIS — E876 Hypokalemia: Secondary | ICD-10-CM | POA: Diagnosis present

## 2023-12-01 DIAGNOSIS — R6510 Systemic inflammatory response syndrome (SIRS) of non-infectious origin without acute organ dysfunction: Secondary | ICD-10-CM

## 2023-12-01 DIAGNOSIS — I2699 Other pulmonary embolism without acute cor pulmonale: Secondary | ICD-10-CM | POA: Diagnosis not present

## 2023-12-01 DIAGNOSIS — R197 Diarrhea, unspecified: Secondary | ICD-10-CM

## 2023-12-01 DIAGNOSIS — B9620 Unspecified Escherichia coli [E. coli] as the cause of diseases classified elsewhere: Secondary | ICD-10-CM | POA: Diagnosis not present

## 2023-12-01 DIAGNOSIS — R110 Nausea: Principal | ICD-10-CM

## 2023-12-01 DIAGNOSIS — A4150 Gram-negative sepsis, unspecified: Principal | ICD-10-CM | POA: Diagnosis present

## 2023-12-01 DIAGNOSIS — Z87440 Personal history of urinary (tract) infections: Secondary | ICD-10-CM

## 2023-12-01 DIAGNOSIS — Z7901 Long term (current) use of anticoagulants: Secondary | ICD-10-CM

## 2023-12-01 DIAGNOSIS — E66813 Obesity, class 3: Secondary | ICD-10-CM | POA: Diagnosis present

## 2023-12-01 DIAGNOSIS — Z6841 Body Mass Index (BMI) 40.0 and over, adult: Secondary | ICD-10-CM | POA: Diagnosis not present

## 2023-12-01 DIAGNOSIS — I82403 Acute embolism and thrombosis of unspecified deep veins of lower extremity, bilateral: Secondary | ICD-10-CM | POA: Diagnosis present

## 2023-12-01 DIAGNOSIS — Z794 Long term (current) use of insulin: Secondary | ICD-10-CM

## 2023-12-01 DIAGNOSIS — Z8249 Family history of ischemic heart disease and other diseases of the circulatory system: Secondary | ICD-10-CM | POA: Diagnosis not present

## 2023-12-01 DIAGNOSIS — Z87891 Personal history of nicotine dependence: Secondary | ICD-10-CM

## 2023-12-01 DIAGNOSIS — N390 Urinary tract infection, site not specified: Secondary | ICD-10-CM

## 2023-12-01 DIAGNOSIS — N136 Pyonephrosis: Secondary | ICD-10-CM | POA: Diagnosis present

## 2023-12-01 DIAGNOSIS — Z79899 Other long term (current) drug therapy: Secondary | ICD-10-CM | POA: Diagnosis not present

## 2023-12-01 DIAGNOSIS — K5900 Constipation, unspecified: Secondary | ICD-10-CM | POA: Diagnosis present

## 2023-12-01 DIAGNOSIS — Z833 Family history of diabetes mellitus: Secondary | ICD-10-CM

## 2023-12-01 DIAGNOSIS — N739 Female pelvic inflammatory disease, unspecified: Secondary | ICD-10-CM | POA: Diagnosis not present

## 2023-12-01 DIAGNOSIS — J9601 Acute respiratory failure with hypoxia: Secondary | ICD-10-CM | POA: Diagnosis present

## 2023-12-01 DIAGNOSIS — E1165 Type 2 diabetes mellitus with hyperglycemia: Secondary | ICD-10-CM | POA: Diagnosis present

## 2023-12-01 DIAGNOSIS — N6310 Unspecified lump in the right breast, unspecified quadrant: Secondary | ICD-10-CM | POA: Diagnosis present

## 2023-12-01 DIAGNOSIS — R112 Nausea with vomiting, unspecified: Secondary | ICD-10-CM | POA: Diagnosis not present

## 2023-12-01 DIAGNOSIS — N179 Acute kidney failure, unspecified: Secondary | ICD-10-CM | POA: Diagnosis not present

## 2023-12-01 DIAGNOSIS — N736 Female pelvic peritoneal adhesions (postinfective): Secondary | ICD-10-CM | POA: Diagnosis present

## 2023-12-01 DIAGNOSIS — I10 Essential (primary) hypertension: Secondary | ICD-10-CM | POA: Diagnosis not present

## 2023-12-01 DIAGNOSIS — N7093 Salpingitis and oophoritis, unspecified: Secondary | ICD-10-CM

## 2023-12-01 DIAGNOSIS — N83201 Unspecified ovarian cyst, right side: Secondary | ICD-10-CM | POA: Diagnosis present

## 2023-12-01 DIAGNOSIS — Z87410 Personal history of cervical dysplasia: Secondary | ICD-10-CM

## 2023-12-01 DIAGNOSIS — N7003 Acute salpingitis and oophoritis: Secondary | ICD-10-CM | POA: Diagnosis not present

## 2023-12-01 DIAGNOSIS — K760 Fatty (change of) liver, not elsewhere classified: Secondary | ICD-10-CM | POA: Diagnosis present

## 2023-12-01 DIAGNOSIS — R000 Tachycardia, unspecified: Secondary | ICD-10-CM | POA: Diagnosis not present

## 2023-12-01 DIAGNOSIS — R300 Dysuria: Secondary | ICD-10-CM

## 2023-12-01 HISTORY — DX: Type 2 diabetes mellitus without complications: E11.9

## 2023-12-01 LAB — RESP PANEL BY RT-PCR (RSV, FLU A&B, COVID)  RVPGX2
Influenza A by PCR: NEGATIVE
Influenza B by PCR: NEGATIVE
Resp Syncytial Virus by PCR: NEGATIVE
SARS Coronavirus 2 by RT PCR: NEGATIVE

## 2023-12-01 LAB — COMPREHENSIVE METABOLIC PANEL WITH GFR
ALT: 29 U/L (ref 0–44)
AST: 26 U/L (ref 15–41)
Albumin: 3.8 g/dL (ref 3.5–5.0)
Alkaline Phosphatase: 93 U/L (ref 38–126)
Anion gap: 16 — ABNORMAL HIGH (ref 5–15)
BUN: 9 mg/dL (ref 6–20)
CO2: 22 mmol/L (ref 22–32)
Calcium: 9.7 mg/dL (ref 8.9–10.3)
Chloride: 93 mmol/L — ABNORMAL LOW (ref 98–111)
Creatinine, Ser: 1.17 mg/dL — ABNORMAL HIGH (ref 0.44–1.00)
GFR, Estimated: >60 mL/min (ref >60)
Glucose, Bld: 201 mg/dL — ABNORMAL HIGH (ref 70–99)
Potassium: 3.2 mmol/L — ABNORMAL LOW (ref 3.5–5.1)
Sodium: 131 mmol/L — ABNORMAL LOW (ref 135–145)
Total Bilirubin: 0.8 mg/dL (ref 0.0–1.2)
Total Protein: 8.1 g/dL (ref 6.5–8.1)

## 2023-12-01 LAB — CBC WITH DIFFERENTIAL/PLATELET
Abs Immature Granulocytes: 0.36 K/uL — ABNORMAL HIGH (ref 0.00–0.07)
Basophils Absolute: 0.1 K/uL (ref 0.0–0.1)
Basophils Relative: 0 %
Eosinophils Absolute: 0 K/uL (ref 0.0–0.5)
Eosinophils Relative: 0 %
HCT: 39.5 % (ref 36.0–46.0)
Hemoglobin: 12.7 g/dL (ref 12.0–15.0)
Immature Granulocytes: 2 %
Lymphocytes Relative: 3 %
Lymphs Abs: 0.7 K/uL (ref 0.7–4.0)
MCH: 30.2 pg (ref 26.0–34.0)
MCHC: 32.2 g/dL (ref 30.0–36.0)
MCV: 93.8 fL (ref 80.0–100.0)
Monocytes Absolute: 1.4 K/uL — ABNORMAL HIGH (ref 0.1–1.0)
Monocytes Relative: 7 %
Neutro Abs: 19.2 K/uL — ABNORMAL HIGH (ref 1.7–7.7)
Neutrophils Relative %: 88 %
Platelets: 262 K/uL (ref 150–400)
RBC: 4.21 MIL/uL (ref 3.87–5.11)
RDW: 11.9 % (ref 11.5–15.5)
Smear Review: NORMAL
WBC: 21.7 K/uL — ABNORMAL HIGH (ref 4.0–10.5)
nRBC: 0 % (ref 0.0–0.2)

## 2023-12-01 LAB — URINALYSIS, W/ REFLEX TO CULTURE (INFECTION SUSPECTED)
Bacteria, UA: NONE SEEN
Bilirubin Urine: NEGATIVE
Glucose, UA: NEGATIVE mg/dL
Hgb urine dipstick: NEGATIVE
Ketones, ur: 5 mg/dL — AB
Leukocytes,Ua: NEGATIVE
Nitrite: NEGATIVE
Protein, ur: NEGATIVE mg/dL
Specific Gravity, Urine: 1.028 (ref 1.005–1.030)
pH: 5 (ref 5.0–8.0)

## 2023-12-01 LAB — GLUCOSE, CAPILLARY: Glucose-Capillary: 158 mg/dL — ABNORMAL HIGH (ref 70–99)

## 2023-12-01 LAB — PROTIME-INR
INR: 1.3 — ABNORMAL HIGH (ref 0.8–1.2)
Prothrombin Time: 16.5 s — ABNORMAL HIGH (ref 11.4–15.2)

## 2023-12-01 LAB — HCG, SERUM, QUALITATIVE: Preg, Serum: NEGATIVE

## 2023-12-01 LAB — CK: Total CK: 162 U/L (ref 38–234)

## 2023-12-01 LAB — I-STAT CG4 LACTIC ACID, ED: Lactic Acid, Venous: 1.8 mmol/L (ref 0.5–1.9)

## 2023-12-01 LAB — CBG MONITORING, ED: Glucose-Capillary: 147 mg/dL — ABNORMAL HIGH (ref 70–99)

## 2023-12-01 MED ORDER — PIPERACILLIN-TAZOBACTAM 3.375 G IVPB 30 MIN
3.3750 g | Freq: Once | INTRAVENOUS | Status: AC
  Administered 2023-12-01: 3.375 g via INTRAVENOUS
  Filled 2023-12-01: qty 50

## 2023-12-01 MED ORDER — FENTANYL CITRATE (PF) 50 MCG/ML IJ SOSY
50.0000 ug | PREFILLED_SYRINGE | Freq: Once | INTRAMUSCULAR | Status: AC
  Administered 2023-12-01: 50 ug via INTRAVENOUS
  Filled 2023-12-01: qty 1

## 2023-12-01 MED ORDER — ONDANSETRON HCL 4 MG/2ML IJ SOLN
4.0000 mg | Freq: Four times a day (QID) | INTRAMUSCULAR | Status: AC | PRN
  Administered 2023-12-01 – 2023-12-02 (×2): 4 mg via INTRAVENOUS
  Filled 2023-12-01 (×2): qty 2

## 2023-12-01 MED ORDER — HYDROMORPHONE HCL 1 MG/ML IJ SOLN
0.5000 mg | INTRAMUSCULAR | Status: DC | PRN
  Administered 2023-12-01 – 2023-12-03 (×6): 0.5 mg via INTRAVENOUS
  Filled 2023-12-01 (×6): qty 0.5

## 2023-12-01 MED ORDER — ENOXAPARIN SODIUM 80 MG/0.8ML IJ SOSY: 65.0000 mg | PREFILLED_SYRINGE | INTRAMUSCULAR | Status: DC

## 2023-12-01 MED ORDER — ACETAMINOPHEN 325 MG PO TABS
650.0000 mg | ORAL_TABLET | Freq: Once | ORAL | Status: DC | PRN
  Filled 2023-12-01: qty 2

## 2023-12-01 MED ORDER — ACETAMINOPHEN 10 MG/ML IV SOLN
1000.0000 mg | Freq: Once | INTRAVENOUS | Status: AC
  Administered 2023-12-01: 1000 mg via INTRAVENOUS
  Filled 2023-12-01: qty 100

## 2023-12-01 MED ORDER — KETOROLAC TROMETHAMINE 30 MG/ML IJ SOLN
30.0000 mg | Freq: Once | INTRAMUSCULAR | Status: AC
  Administered 2023-12-01: 30 mg via INTRAVENOUS
  Filled 2023-12-01: qty 1

## 2023-12-01 MED ORDER — POTASSIUM CHLORIDE IN NACL 20-0.9 MEQ/L-% IV SOLN
INTRAVENOUS | Status: AC
  Filled 2023-12-01 (×3): qty 1000

## 2023-12-01 MED ORDER — KETOROLAC TROMETHAMINE 15 MG/ML IJ SOLN
15.0000 mg | Freq: Four times a day (QID) | INTRAMUSCULAR | Status: DC | PRN
  Administered 2023-12-01 – 2023-12-02 (×4): 15 mg via INTRAVENOUS
  Filled 2023-12-01 (×4): qty 1

## 2023-12-01 MED ORDER — SODIUM CHLORIDE 0.9 % IV SOLN
2.0000 g | INTRAVENOUS | Status: AC
  Administered 2023-12-01 – 2023-12-05 (×5): 2 g via INTRAVENOUS
  Filled 2023-12-01 (×5): qty 20

## 2023-12-01 MED ORDER — SODIUM CHLORIDE 0.9 % IV BOLUS
1000.0000 mL | Freq: Once | INTRAVENOUS | Status: AC
  Administered 2023-12-01: 1000 mL via INTRAVENOUS

## 2023-12-01 MED ORDER — POTASSIUM CHLORIDE 10 MEQ/100ML IV SOLN
10.0000 meq | INTRAVENOUS | Status: AC
  Administered 2023-12-01: 10 meq via INTRAVENOUS
  Filled 2023-12-01 (×4): qty 100

## 2023-12-01 MED ORDER — INSULIN ASPART 100 UNIT/ML IJ SOLN
0.0000 [IU] | Freq: Three times a day (TID) | INTRAMUSCULAR | Status: DC
  Administered 2023-12-01: 3 [IU] via SUBCUTANEOUS
  Administered 2023-12-02 – 2023-12-05 (×12): 2 [IU] via SUBCUTANEOUS
  Administered 2023-12-06 (×3): 3 [IU] via SUBCUTANEOUS
  Administered 2023-12-07 (×3): 2 [IU] via SUBCUTANEOUS
  Administered 2023-12-08 (×3): 3 [IU] via SUBCUTANEOUS
  Administered 2023-12-09: 5 [IU] via SUBCUTANEOUS
  Filled 2023-12-01: qty 0.15

## 2023-12-01 MED ORDER — IOHEXOL 300 MG/ML  SOLN
100.0000 mL | Freq: Once | INTRAMUSCULAR | Status: AC | PRN
  Administered 2023-12-01: 100 mL via INTRAVENOUS

## 2023-12-01 MED ORDER — ACETAMINOPHEN 325 MG PO TABS
650.0000 mg | ORAL_TABLET | Freq: Four times a day (QID) | ORAL | Status: DC | PRN
  Filled 2023-12-01: qty 2

## 2023-12-01 MED ORDER — ONDANSETRON HCL 4 MG/2ML IJ SOLN
4.0000 mg | Freq: Once | INTRAMUSCULAR | Status: AC
  Administered 2023-12-01: 4 mg via INTRAVENOUS
  Filled 2023-12-01: qty 2

## 2023-12-01 MED ORDER — SODIUM CHLORIDE 0.9 % IV SOLN: Freq: Once | INTRAVENOUS | Status: AC

## 2023-12-01 NOTE — H&P (Signed)
 History and Physical    Patient: Misty Walsh FMW:[id number] DOB: [birth year] DOA: 12/01/2023 DOS: the patient was seen and examined on 12/01/2023 PCP: Buck Search, PA-C  Patient coming from: Home  Chief Complaint:  Chief Complaint  Patient presents with   Nausea   HPI: ARDYTHE KLUTE is a 33 y.o. female with PMH of morbid obesity, NIDDM-2 and HTN brought to ED by EMS due to abdominal pain, nausea and dry heaving.  Patient reports pain across lower abdomen for about a week.  She was seen at urgent care on 10/10 with abdominal pain, dysuria and vaginal discharge.  At that time, urinalysis was concerning for UTI.  Urine culture, chlamydia and gonorrhea tests ordered and she was discharged on p.o. cefdinir  that she started but could not continue taking due to worsening nausea and dry heaving.  Urine culture at [facility] with pansensitive E. coli.  Patient new right flank pain in addition to lower abdominal pain.  She describes the pain as cramping and stabbing.  Rates pain severity 15 on a scale of 10 but improved to 8 after she received pain medication in ED.  She also reports fever to 101 at home.  Her blood glucose has been running from 250-300.  She says she has diet-controlled diabetes and no longer takes Victoza .  She also reports bowel accident every time she tries to urinate.  She reports increased frequency, urgency and dysuria.   Quit smoking about 4 years ago.  Denies drinking alcohol or recreational drug use.  In ED, febrile to 103.  HR ranges from 100s to 120s.  RR in the 20s.  Na 132.  K3.2.  Cr 1.17.  Glucose 201.  WBC 21.7.  Lactic acid 1.8.  UA without significant finding.  CXR suggests atelectasis in left lower lung base.  CT abdomen and pelvis with new cystic lesion measuring 7.7 cm, increased right ovarian cyst measuring 6.1 cm and possible right hydrosalpinx.  Patient has received 2 L of NS bolus, IV Zosyn, Toradol , Tylenol  and fentanyl .  Guynn consulted and recommended  nonemergent MRI to evaluate the cystic lesions.  Admission requested for urosepsis.   Review of Systems: As mentioned in the history of present illness. All other systems reviewed and are negative. Past Medical History:  Diagnosis Date   Anxiety    Back pain    Chlamydia    Depression med made her navel itching and  made her sleepy so she quit taking them   Diabetes mellitus without complication (HCC)    Edema, lower extremity    Elevated cholesterol    Fibrocystic breast changes of both breasts    GERD (gastroesophageal reflux disease)    History of cesarean section, classical 12/18/2012   2014    Human papilloma virus    Hypertension    Insomnia    Joint pain    Moderate dysplasia of cervix    Numbness    right arm to hand   Ovarian cyst    Prediabetes    Vitamin D  deficiency    Past Surgical History:  Procedure Laterality Date   BREAST BIOPSY Left 2012   benign fibroadeoma   BREAST SURGERY     CESAREAN SECTION N/A 12/13/2012   Procedure: PRIMARY CESAREAN SECTION;  Surgeon: Aida DELENA Na, MD;  Location: [facility];  Service: Obstetrics;  Laterality: N/A;   LEEP     Social History:  reports that she quit smoking about 3 years ago. Her smoking use included cigarettes. She  started smoking about 8 years ago. She has a 1.3 pack-year smoking history. She has never used smokeless tobacco. She reports that she does not currently use alcohol. She reports that she does not use drugs.  Allergies  Allergen Reactions   Macrobid  [Nitrofurantoin  Macrocrystal] Itching    Caused the patient to feel hot.    Family History  Problem Relation Age of Onset   Diabetes Mother    Heart disease Mother    Hypertension Mother    Depression Mother    Stroke Mother    Arthritis Mother    Anxiety disorder Mother    Stroke Father    High blood pressure Father    High Cholesterol Father    Depression Father    Anxiety disorder Father    Obesity Father    Bipolar disorder Father     Asthma Sister    Kidney disease Brother        genetic condition   Hypertension Maternal Grandmother    Dementia Maternal Grandmother    Heart disease Maternal Grandmother    Diabetes Paternal Grandmother    Diabetes Maternal Aunt    Hypertension Maternal Aunt    Asthma Maternal Aunt    Epilepsy Maternal Aunt     Prior to Admission medications   Medication Sig Start Date End Date Taking? Authorizing Provider  ALLERGY RELIEF 180 MG tablet Take by mouth. 06/10/22   [provider]  atorvastatin (LIPITOR) 80 MG tablet Take 80 mg by mouth daily. 03/16/21   [provider]  busPIRone  (BUSPAR ) 15 MG tablet Take 15 mg by mouth 2 (two) times daily. 06/12/21   [provider]  chlorthalidone  (HYGROTON ) 25 MG tablet Take 1 tablet (25 mg total) by mouth daily. 11/13/22 02/11/23  Lonni Slain, MD  fluticasone  (FLONASE ) 50 MCG/ACT nasal spray Place 1 spray into both nostrils daily. Begin by using 2 sprays in each nare daily for 3 to 5 days, then decrease to 1 spray in each nare daily. 04/09/22   Dohmeier, Dedra, MD  sertraline  (ZOLOFT ) 25 MG tablet Take 25 mg by mouth daily. 03/16/21   [provider]  tirzepatide  (MOUNJARO ) 2.5 MG/0.5ML Pen Inject 2.5 mg into the skin once a week. 12/26/22   Francyne Romano, MD    Physical Exam: Vitals:   12/01/23 1435 12/01/23 1500 12/01/23 1538 12/01/23 1747  BP: (!) 144/79 (!) 151/83 (!) 140/77 108/80  Pulse: (!) 115 (!) 106 (!) 104 (!) 111  Resp:  (!) 23 (!) 24 (!) 22  Temp:   99 F (37.2 C) (!) 101 F (38.3 C)  TempSrc:   Oral Oral  SpO2: 99% 98% 97% 100%  Weight:      Height:       GENERAL: No apparent distress.  Nontoxic. HEENT: MMM.  Vision and hearing grossly intact.  NECK: Supple.  No apparent JVD.  RESP:  No IWOB.  Fair aeration bilaterally. CVS: Tachycardic to 110s.  Heart sounds normal.  ABD/GI/GU: BS+. Abd soft.  Tenderness across lower abdomen.  No CVA tenderness. MSK/EXT:   No apparent  deformity. Moves extremities. No edema.  SKIN: no apparent skin lesion or wound NEURO: Awake and alert. Oriented appropriately.  No apparent focal neuro deficit. PSYCH: Calm. Normal affect.  Data Reviewed: See HPI  Assessment and Plan: Sepsis due to urinary tract infection: Patient with fever, dysuria, frequency, urgency, lower abdominal pain and right flank pain.  Her UA was positive on 10/10.  Urine culture at the same time  showed pansensitive E. Coli.  Now febrile with tachycardia, tachypnea and leukocytosis.  Received IV fluid and Zosyn in ED. CT with cystic lesions as above. - Will continue antibiotic with IV ceftriaxone  -Analgesics, antiemetics and IV fluid - Follow-up blood cultures - MRI pelvis in the morning - Reengage gynecology based on MRI result  NIDDM-2 with hyperglycemia: A1c 6.5% in 11/2022.  Not on meds Recent Labs  Lab 12/01/23 1926  GLUCAP 147*  - SSI-moderate starting tonight  Hypokalemia/hyponatremia: Likely due to dehydration and chlorthalidone  - Hold thiazide diuretics - IV KCl 10 mEq x 4 - IV NS with KCl - Check magnesium   Leukocytosis/bandemia: Likely due to sepsis - Antibiotics as above - Recheck in the  New low-density pelvic cystic lesion Increased complex right ovarian cystic lesion Increased right hydrosalpinx - MRI pelvis in the morning - Reengage gynecology after MRI result.  Dr. Gloris Kearns consulted by EDP  Morbid obesity: BMI 51.49. - Encourage lifestyle change to lose weight    Advance Care Planning:   Code Status: Full Code-discussed with patient  Consults: Gynecology  Family Communication: None at bedside  Severity of Illness: The appropriate patient status for this patient is INPATIENT. Inpatient status is judged to be reasonable and necessary in order to provide the required intensity of service to ensure the patient's safety. The patient's presenting symptoms, physical exam findings, and initial radiographic and laboratory  data in the context of their chronic comorbidities is felt to place them at high risk for further clinical deterioration. Furthermore, it is not anticipated that the patient will be medically stable for discharge from the hospital within 2 midnights of admission.   * I certify that at the point of admission it is my clinical judgment that the patient will require inpatient hospital care spanning beyond 2 midnights from the point of admission due to high intensity of service, high risk for further deterioration and high frequency of surveillance required.*  Author: Mignon ONEIDA Bump, MD 12/01/2023 7:35 PM  For on call review [email or web address].

## 2023-12-01 NOTE — ED Provider Notes (Signed)
 33 yo female here with fever, nausea, vomiting and diarrhea Plan for admission for failing PO intake Pending CT abdomen and UA, diarrhea stool studies ordered Given IV zofran  and 2L NS as well WBC 21.7  Physical Exam  BP 108/80 (BP Location: Left Arm)   Pulse (!) 111   Temp (!) 101 F (38.3 C) (Oral)   Resp (!) 22   Ht 5' 4 (1.626 m)   Wt 136.1 kg   LMP 11/18/2023 (Exact Date)   SpO2 100%   BMI 51.49 kg/m   Physical Exam  Procedures  Procedures  ED Course / MDM   Clinical Course as of 12/01/23 1857  Mon Dec 01, 2023  1828 Messaged OBGYN to discuss CT image findings [MT]  1839 I spoke to Dr Herchel from [facility] who reviewed his images and felt the patient would be stable for admission and IV antibiotics did not require an emergency MRI at this time, but could pursue outpatient imaging for these cystic/pelvic masses.   [MT]    Clinical Course User Index [MT] Inri Sobieski, Donnice PARAS, MD   Medical Decision Making Amount and/or Complexity of Data Reviewed Labs: ordered. Radiology: ordered.  Risk Prescription drug management. Decision regarding hospitalization.   CBC is reviewed.  There are complex cyst noted around the ovaries, including dilation of the right fallopian tube, which is chronic.  Patient reports that her primary complaint has been dysuria, which has been very painful.  I reviewed her external records from [facility] and she did have a positive urine culture with 100,000 colony units of pansensitive E. coli.  She also had a negative GC chlamydia test at that time.  7 pm pt is admitted to the hospitalist w/ stable BP, improved abdominal pain     Cottie Donnice PARAS, MD 12/01/23 1857

## 2023-12-01 NOTE — ED Provider Notes (Signed)
 [place] EMERGENCY DEPARTMENT AT [facility] Provider Note   CSN: [id number] Arrival date & time: 12/01/23  1231     Patient presents with: Nausea   Misty Walsh is a 33 y.o. female.   HPI    Presents with nausea vomiting and concern for UTI.  Patient states that she followed up at urgent care approximate 3 days ago because of some dysuria.  Was called and told that she had a positive culture.  She had been discharged from the urgent care 3 days ago and was started on oral antibiotic but she was unable to tolerate this because of ongoing nausea and vomiting.  Patient states that she had nausea vomiting and diarrhea.  Unable to tolerate p.o.  Denies any previous abdominal surgeries.  Endorses some dysuria.  No vaginal discharge.  No hematochezia or hematemesis.  Patient states her whole body hurts currently.  No chest pain or shortness of breath.  No prior chest pain or hemoptysis.  Denies any history of DVT or PE.   Previous medical history reviewed : Patient was seen in urgent care on October 10.  Patient was sent home on Omnicef .   Prior to Admission medications   Medication Sig Start Date End Date Taking? Authorizing Provider  ALLERGY RELIEF 180 MG tablet Take by mouth. 06/10/22   [provider]  atorvastatin (LIPITOR) 80 MG tablet Take 80 mg by mouth daily. 03/16/21   [provider]  busPIRone  (BUSPAR ) 15 MG tablet Take 15 mg by mouth 2 (two) times daily. 06/12/21   [provider]  chlorthalidone  (HYGROTON ) 25 MG tablet Take 1 tablet (25 mg total) by mouth daily. 11/13/22 02/11/23  Lonni Slain, MD  fluticasone  (FLONASE ) 50 MCG/ACT nasal spray Place 1 spray into both nostrils daily. Begin by using 2 sprays in each nare daily for 3 to 5 days, then decrease to 1 spray in each nare daily. 04/09/22   Dohmeier, Dedra, MD  sertraline  (ZOLOFT ) 25 MG tablet Take 25 mg by mouth daily. 03/16/21   [provider]  tirzepatide   (MOUNJARO ) 2.5 MG/0.5ML Pen Inject 2.5 mg into the skin once a week. 12/26/22   Francyne Romano, MD    Allergies: Macrobid  [nitrofurantoin  macrocrystal]    Review of Systems  Constitutional:  Negative for chills and fever.  HENT:  Negative for ear pain and sore throat.   Eyes:  Negative for pain and visual disturbance.  Respiratory:  Negative for cough and shortness of breath.   Cardiovascular:  Negative for chest pain and palpitations.  Gastrointestinal:  Negative for abdominal pain and vomiting.  Genitourinary:  Negative for dysuria and hematuria.  Musculoskeletal:  Negative for arthralgias and back pain.  Skin:  Negative for color change and rash.  Neurological:  Negative for seizures and syncope.  All other systems reviewed and are negative.   Updated Vital Signs BP (!) 140/77 (BP Location: Left Arm)   Pulse (!) 104   Temp 99 F (37.2 C) (Oral)   Resp (!) 24   Ht 5' 4 (1.626 m)   Wt 136.1 kg   LMP 11/18/2023 (Exact Date)   SpO2 97%   BMI 51.49 kg/m   Physical Exam Vitals and nursing note reviewed.  Constitutional:      General: She is not in acute distress.    Appearance: She is well-developed.  HENT:     Head: Normocephalic and atraumatic.  Eyes:     Conjunctiva/sclera: Conjunctivae normal.  Cardiovascular:  Rate and Rhythm: Normal rate and regular rhythm.     Heart sounds: No murmur heard. Pulmonary:     Effort: Pulmonary effort is normal. No respiratory distress.     Breath sounds: Normal breath sounds.  Abdominal:     Palpations: Abdomen is soft.     Tenderness: There is no abdominal tenderness.  Musculoskeletal:        General: No swelling.     Cervical back: Neck supple.  Skin:    General: Skin is warm and dry.     Capillary Refill: Capillary refill takes less than 2 seconds.  Neurological:     Mental Status: She is alert.  Psychiatric:        Mood and Affect: Mood normal.     (all labs ordered are listed, but only abnormal results are  displayed) Labs Reviewed  COMPREHENSIVE METABOLIC PANEL WITH GFR - Abnormal; Notable for the following components:      Result Value   Sodium 131 (*)    Potassium 3.2 (*)    Chloride 93 (*)    Glucose, Bld 201 (*)    Creatinine, Ser 1.17 (*)    Anion gap 16 (*)    All other components within normal limits  CBC WITH DIFFERENTIAL/PLATELET - Abnormal; Notable for the following components:   WBC 21.7 (*)    Neutro Abs 19.2 (*)    Monocytes Absolute 1.4 (*)    Abs Immature Granulocytes 0.36 (*)    All other components within normal limits  PROTIME-INR - Abnormal; Notable for the following components:   Prothrombin Time 16.5 (*)    INR 1.3 (*)    All other components within normal limits  RESP PANEL BY RT-PCR (RSV, FLU A&B, COVID)  RVPGX2  CULTURE, BLOOD (ROUTINE X 2)  CULTURE, BLOOD (ROUTINE X 2)  GASTROINTESTINAL PANEL BY PCR, STOOL (REPLACES STOOL CULTURE)  HCG, SERUM, QUALITATIVE  CK  URINALYSIS, W/ REFLEX TO CULTURE (INFECTION SUSPECTED)  I-STAT CG4 LACTIC ACID, ED    EKG: EKG Interpretation Date/Time:  Monday December 01 2023 13:01:11 EDT Ventricular Rate:  124 PR Interval:  131 QRS Duration:  88 QT Interval:  301 QTC Calculation: 433 R Axis:   33  Text Interpretation: Sinus tachycardia Abnormal T, consider ischemia, diffuse leads Confirmed by Simon Rea [phone]) on 12/01/2023 1:03:34 PM  Radiology: [facility] Chest Portable 1 View Result Date: 12/01/2023 CLINICAL DATA:  Shortness of breath, sepsis EXAM: PORTABLE CHEST 1 VIEW COMPARISON:  09/19/2016 FINDINGS: Low lung volumes are present, causing crowding of the pulmonary vasculature. Bandlike opacity just above the left hemidiaphragm probably representing atelectasis in the left lower lobe. Hazy density along the right hemithorax likely mostly attributable to soft tissues of the chest wall and the underlying low lung volumes, no discrete airspace opacity identified on the right. No blunting the costophrenic angles. No significant  bony findings. IMPRESSION: 1. Low lung volumes are present, causing crowding of the pulmonary vasculature. 2. Bandlike opacity just above the left hemidiaphragm probably representing atelectasis in the left lower lobe. Electronically Signed   By: Ryan Salvage M.D.   On: 12/01/2023 13:44     Procedures   Medications Ordered in the ED  sodium chloride  0.9 % bolus 1,000 mL (0 mLs Intravenous Stopped 12/01/23 1440)  sodium chloride  0.9 % bolus 1,000 mL (0 mLs Intravenous Stopped 12/01/23 1440)  piperacillin-tazobactam (ZOSYN) IVPB 3.375 g (0 g Intravenous Stopped 12/01/23 1420)  acetaminophen  (OFIRMEV ) IV 1,000 mg (0 mg Intravenous Stopped 12/01/23 1420)  ondansetron  (ZOFRAN ) injection 4 mg (4 mg Intravenous Given 12/01/23 1536)  0.9 %  sodium chloride  infusion ( Intravenous New Bag/Given 12/01/23 1535)  iohexol  (OMNIPAQUE ) 300 MG/ML solution 100 mL (100 mLs Intravenous Contrast Given 12/01/23 1621)                                    Medical Decision Making Amount and/or Complexity of Data Reviewed Labs: ordered. Radiology: ordered.  Risk OTC drugs. Prescription drug management.     HPI:    Presents with nausea vomiting and concern for UTI.  Patient states that she followed up at urgent care approximate 3 days ago because of some dysuria.  Was called and told that she had a positive culture.  She had been discharged from the urgent care 3 days ago and was started on oral antibiotic but she was unable to tolerate this because of ongoing nausea and vomiting.  Patient states that she had nausea vomiting and diarrhea.  Unable to tolerate p.o.  Denies any previous abdominal surgeries.  Endorses some dysuria.  No vaginal discharge.  No hematochezia or hematemesis.  Patient states her whole body hurts currently.  No chest pain or shortness of breath.  No prior chest pain or hemoptysis.  Denies any history of DVT or PE.   Previous medical history reviewed : Patient was seen in urgent  care on October 10.  Patient was sent home on Omnicef .   MDM:   Upon exam, patient tachycardic.  Febrile.  Endorsing nausea vomiting and diarrhea.  No chest pain or shortness of breath.  No risk factors for DVT or PE.  Patient temperature of 103 F.  Concern for possible sepsis.  Obtain lactic acid.  Negative.  Does show leukocytosis and left shift.  Patient was started on 2 L of normal saline followed by maintenance fluid.  Heart rate improved from 125 132 approxione 100.  O2 saturation remained upper 90s on room air.  Patient received IV Tylenol  in setting of patient's nausea and vomiting and subsequently temperature went from 103 to 99 F.   Patient had multiple episodes of diarrhea here in the ED.  Did send this off for culture.   Given concern for possible UTI source or intra-abdominal source, did start patient on Zosyn empirically when I first saw the patient based off of presentation.  This is also supported by patient recently having UTI at urgent care where she is not able to tolerate p.o.  CT scan as well.  Will rule out and cannot acute surgical pathology.     Interventions: 2 L NS, mivf, zosyn  EKG Interpreted by Me: sinus tachycardia    Cardiac Tele Interpreted by Me: no arrythmia    I have independently interpreted the CXR  and CT  images and agree with the radiologist finding   Social Determinant of Health:    Disposition and Follow Up: Will be admitted     CRITICAL CARE Performed by: Lavonia LOISE Pat   Total critical care time: 45 minutes  Critical care time was exclusive of separately billable procedures and treating other patients.  Critical care was necessary to treat or prevent imminent or life-threatening deterioration.  Critical care was time spent personally by me on the following activities: development of treatment plan with patient and/or surrogate as well as nursing, discussions with consultants, evaluation of patient's response to treatment,  examination of patient, obtaining history from  patient or surrogate, ordering and performing treatments and interventions, ordering and review of laboratory studies, ordering and review of radiographic studies, pulse oximetry and re-evaluation of patient's condition.     Final diagnoses:  Nausea without vomiting  Diarrhea of presumed infectious origin  Dysuria  SIRS (systemic inflammatory response syndrome) [facility])    ED Discharge Orders     None          Simon Lavonia SAILOR, MD 12/01/23 1656

## 2023-12-01 NOTE — ED Triage Notes (Addendum)
 Patient BIB [facility] from home. Abdominal pain for 5 days, nausea and vomiting. 3 days ago prescribed antibiotic for UTI but trouble keeping the medication down. Said her urine culture came back positive today. Had covid 1 week ago.

## 2023-12-02 ENCOUNTER — Inpatient Hospital Stay (HOSPITAL_COMMUNITY): Payer: MEDICAID

## 2023-12-02 ENCOUNTER — Encounter (HOSPITAL_COMMUNITY): Payer: Self-pay | Admitting: Student

## 2023-12-02 DIAGNOSIS — E66813 Obesity, class 3: Secondary | ICD-10-CM | POA: Insufficient documentation

## 2023-12-02 DIAGNOSIS — N10 Acute tubulo-interstitial nephritis: Secondary | ICD-10-CM | POA: Insufficient documentation

## 2023-12-02 DIAGNOSIS — N7093 Salpingitis and oophoritis, unspecified: Secondary | ICD-10-CM

## 2023-12-02 DIAGNOSIS — E119 Type 2 diabetes mellitus without complications: Secondary | ICD-10-CM

## 2023-12-02 DIAGNOSIS — Z6841 Body Mass Index (BMI) 40.0 and over, adult: Secondary | ICD-10-CM

## 2023-12-02 LAB — HIV ANTIBODY (ROUTINE TESTING W REFLEX): HIV Screen 4th Generation wRfx: NONREACTIVE

## 2023-12-02 LAB — GLUCOSE, CAPILLARY
Glucose-Capillary: 130 mg/dL — ABNORMAL HIGH (ref 70–99)
Glucose-Capillary: 149 mg/dL — ABNORMAL HIGH (ref 70–99)
Glucose-Capillary: 150 mg/dL — ABNORMAL HIGH (ref 70–99)
Glucose-Capillary: 138 mg/dL — ABNORMAL HIGH (ref 70–99)

## 2023-12-02 LAB — C DIFFICILE QUICK SCREEN W PCR REFLEX
C Diff antigen: NEGATIVE
C Diff interpretation: NOT DETECTED
C Diff toxin: NEGATIVE

## 2023-12-02 LAB — MAGNESIUM: Magnesium: 2.2 mg/dL (ref 1.7–2.4)

## 2023-12-02 LAB — HEMOGLOBIN A1C
Hgb A1c MFr Bld: 6.3 % — ABNORMAL HIGH (ref 4.8–5.6)
Mean Plasma Glucose: 134.11 mg/dL

## 2023-12-02 MED ORDER — ORAL CARE MOUTH RINSE: 15.0000 mL | OROMUCOSAL | Status: DC | PRN

## 2023-12-02 MED ORDER — ENOXAPARIN SODIUM 60 MG/0.6ML IJ SOSY
60.0000 mg | PREFILLED_SYRINGE | INTRAMUSCULAR | Status: AC
  Administered 2023-12-02 – 2023-12-06 (×5): 60 mg via SUBCUTANEOUS
  Filled 2023-12-02 (×6): qty 0.6

## 2023-12-02 MED ORDER — PROCHLORPERAZINE EDISYLATE 10 MG/2ML IJ SOLN
5.0000 mg | Freq: Once | INTRAMUSCULAR | Status: AC | PRN
  Administered 2023-12-02: 5 mg via INTRAVENOUS
  Filled 2023-12-02: qty 2

## 2023-12-02 MED ORDER — PROCHLORPERAZINE EDISYLATE 10 MG/2ML IJ SOLN
10.0000 mg | Freq: Four times a day (QID) | INTRAMUSCULAR | Status: DC | PRN
  Administered 2023-12-02 – 2023-12-07 (×15): 10 mg via INTRAVENOUS
  Filled 2023-12-02 (×17): qty 2

## 2023-12-02 MED ORDER — SODIUM CHLORIDE 0.9 % IV SOLN
100.0000 mg | Freq: Two times a day (BID) | INTRAVENOUS | Status: DC
  Administered 2023-12-02 – 2023-12-13 (×22): 100 mg via INTRAVENOUS
  Filled 2023-12-02 (×25): qty 100

## 2023-12-02 MED ORDER — ACETAMINOPHEN 325 MG PO TABS
650.0000 mg | ORAL_TABLET | Freq: Four times a day (QID) | ORAL | Status: AC | PRN
  Administered 2023-12-03 – 2023-12-04 (×2): 650 mg via ORAL
  Filled 2023-12-02 (×2): qty 2

## 2023-12-02 MED ORDER — LACTATED RINGERS IV BOLUS
1000.0000 mL | Freq: Once | INTRAVENOUS | Status: AC
  Administered 2023-12-02: 1000 mL via INTRAVENOUS

## 2023-12-02 MED ORDER — ACETAMINOPHEN 10 MG/ML IV SOLN
1000.0000 mg | Freq: Once | INTRAVENOUS | Status: AC
  Administered 2023-12-02: 1000 mg via INTRAVENOUS
  Filled 2023-12-02: qty 100

## 2023-12-02 MED ORDER — ACETAMINOPHEN 650 MG RE SUPP: 650.0000 mg | Freq: Four times a day (QID) | RECTAL | Status: AC | PRN

## 2023-12-02 MED ORDER — GADOBUTROL 1 MMOL/ML IV SOLN
10.0000 mL | Freq: Once | INTRAVENOUS | Status: AC | PRN
Start: 2023-12-02 — End: 2023-12-02
  Administered 2023-12-02: 10 mL via INTRAVENOUS

## 2023-12-02 MED ORDER — LORAZEPAM 0.5 MG PO TABS
0.5000 mg | ORAL_TABLET | Freq: Four times a day (QID) | ORAL | Status: DC | PRN
  Administered 2023-12-02: 0.5 mg via ORAL
  Filled 2023-12-02: qty 1

## 2023-12-02 MED ORDER — HYDRALAZINE HCL 20 MG/ML IJ SOLN
5.0000 mg | Freq: Four times a day (QID) | INTRAMUSCULAR | Status: DC | PRN
  Administered 2023-12-03 – 2023-12-04 (×3): 5 mg via INTRAVENOUS
  Filled 2023-12-02 (×3): qty 1

## 2023-12-02 MED ORDER — METRONIDAZOLE 500 MG/100ML IV SOLN
500.0000 mg | Freq: Two times a day (BID) | INTRAVENOUS | Status: DC
  Administered 2023-12-02 – 2023-12-12 (×21): 500 mg via INTRAVENOUS
  Filled 2023-12-02 (×21): qty 100

## 2023-12-02 MED ORDER — ONDANSETRON HCL 4 MG/2ML IJ SOLN
4.0000 mg | Freq: Four times a day (QID) | INTRAMUSCULAR | Status: DC | PRN
  Administered 2023-12-02 – 2023-12-08 (×17): 4 mg via INTRAVENOUS
  Filled 2023-12-02 (×17): qty 2

## 2023-12-02 NOTE — Hospital Course (Addendum)
 33 year old woman PMH including diabetes, presented with abdominal pain nausea and dry heaving.  Recently seen for abdominal pain at urgent care, dysuria, vaginal discharge.  Chlamydia and gonorrhea negative, urine culture grew out E. coli, treated with cefdinir  which patient could not tolerate.  Reported right flank pain in the ED.  Admitted for sepsis secondary to pyelonephritis and for further evaluation of abnormalities in the pelvis  Consultants None   Procedures/Events 10/13 admit for sepsis, pyelo

## 2023-12-02 NOTE — Progress Notes (Signed)
  Progress Note   Patient: Misty Walsh FMW:[id number] DOB: [birth year] DOA: 12/01/2023     1 DOS: the patient was seen and examined on 12/02/2023   Brief hospital course: 33 year old woman PMH including diabetes, presented with abdominal pain nausea and dry heaving.  Recently seen for abdominal pain at urgent care, dysuria, vaginal discharge.  Chlamydia and gonorrhea negative, urine culture grew out E. coli, treated with cefdinir  which patient could not tolerate.  Reported right flank pain in the ED.  Admitted for sepsis secondary to pyelonephritis and for further evaluation of abnormalities in the pelvis  Consultants None   Procedures/Events 10/13 admit for sepsis, pyelo   Assessment and Plan: Sepsis Suspected tubo-ovarian abscess Acute pyelonephritis Nausea, diarrhea Urine cultures 10/10 prior to admission with E. Coli Will treat with empiric antibiotics and follow culture data, multiple episodes of fever, blood cultures pending.  Exam with marked right CVA tenderness. Antiemetics.  C. difficile screen negative.  GI pathogen panel pending.  Supportive care for diarrhea. Continue empiric ceftriaxone , discussed with Dr. Alger for GYN, she will see in consultation tomorrow, conservative management planned, no surgical intervention.  Recommended adding doxycycline  and metronidazole .  Diabetes mellitus type 2 CBG stable.  Sliding scale insulin .  Class 3 obesity Body mass index is 51.49 kg/m.         Subjective:  Feels about the same Marked right CVA pain Nausea, diarrhea multiple episodes History of previous UTIs  Physical Exam: Vitals:   12/02/23 0850 12/02/23 1230 12/02/23 1332 12/02/23 1544  BP: 139/84 (!) 146/81 (!) 153/85 138/73  Pulse: (!) 116 (!) 110 (!) 118 (!) 127  Resp:  (!) 24 20 (!) 32  Temp:  (!) 100.6 F (38.1 C) 99.9 F (37.7 C) (!) 101.9 F (38.8 C)  TempSrc:  Oral Oral Oral  SpO2:  98% 98%   Weight:      Height:       Physical  Exam Vitals reviewed.  Constitutional:      General: She is not in acute distress.    Appearance: She is not ill-appearing or toxic-appearing.  Cardiovascular:     Rate and Rhythm: Normal rate and regular rhythm.     Heart sounds: No murmur heard. Pulmonary:     Effort: Pulmonary effort is normal. No respiratory distress.     Breath sounds: No wheezing, rhonchi or rales.  Abdominal:     Palpations: Abdomen is soft.     Tenderness: There is right CVA tenderness (marked tenderness).  Neurological:     Mental Status: She is alert.  Psychiatric:        Mood and Affect: Mood normal.        Behavior: Behavior normal.     Data Reviewed: CBG stable CMP was relatively unremarkable Lactic acid within normal limits  WBC elevated at 21.7 HgbA1c 6.3 HIV nonreactive C. difficile screen negative Gastrointestinal panel by PCR pending blood cultures pending  Family Communication:   Disposition: Status is: Inpatient Remains inpatient appropriate because: sepsis     Time spent: 35 minutes  Author: Toribio Door, MD 12/02/2023 5:39 PM  For on call review [email or web address].

## 2023-12-02 NOTE — Progress Notes (Signed)
   12/02/23 0007  Vitals  Temp (!) 103.2 F (39.6 C)  Temp Source Oral  BP (!) 140/82  MAP (mmHg) 98  BP Location Left Arm  BP Method Automatic  Patient Position (if appropriate) Lying  Pulse Rate (!) 119  Pulse Rate Source Monitor  Resp 18  MEWS COLOR  MEWS Score Color Red  Oxygen Therapy  SpO2 99 %  O2 Device Room Air  MEWS Score  MEWS Temp 2  MEWS Systolic 0  MEWS Pulse 2  MEWS RR 0  MEWS LOC 0  MEWS Score 4  Provider Notification  Provider Name/Title J. Blondie NP  Date Provider Notified 12/02/23  Time Provider Notified 0008  Method of Notification Page  Notification Reason Change in status  Provider response See new orders  Date of Provider Response 12/02/23  Time of Provider Response 0010

## 2023-12-02 NOTE — Plan of Care (Signed)
  Problem: Skin Integrity: Goal: Risk for impaired skin integrity will decrease Outcome: Progressing   Problem: Education: Goal: Knowledge of General Education information will improve Description: Including pain rating scale, medication(s)/side effects and non-pharmacologic comfort measures Outcome: Progressing   Problem: Clinical Measurements: Goal: Diagnostic test results will improve Outcome: Progressing   Problem: Activity: Goal: Risk for activity intolerance will decrease Outcome: Progressing   Problem: Pain Managment: Goal: General experience of comfort will improve and/or be controlled Outcome: Progressing

## 2023-12-02 NOTE — Progress Notes (Signed)
 Patient became very nauseated and hot after contrast. Limited study due to the patient refusing to continue. Sent back upstairs.

## 2023-12-03 ENCOUNTER — Inpatient Hospital Stay (HOSPITAL_COMMUNITY): Payer: MEDICAID

## 2023-12-03 DIAGNOSIS — A4150 Gram-negative sepsis, unspecified: Principal | ICD-10-CM

## 2023-12-03 DIAGNOSIS — N390 Urinary tract infection, site not specified: Secondary | ICD-10-CM

## 2023-12-03 LAB — GASTROINTESTINAL PANEL BY PCR, STOOL (REPLACES STOOL CULTURE)

## 2023-12-03 LAB — CBC
HCT: 35.2 % — ABNORMAL LOW (ref 36.0–46.0)
Hemoglobin: 10.4 g/dL — ABNORMAL LOW (ref 12.0–15.0)
MCH: 29.6 pg (ref 26.0–34.0)
MCHC: 29.5 g/dL — ABNORMAL LOW (ref 30.0–36.0)
MCV: 100.3 fL — ABNORMAL HIGH (ref 80.0–100.0)
Platelets: 238 K/uL (ref 150–400)
RBC: 3.51 MIL/uL — ABNORMAL LOW (ref 3.87–5.11)
RDW: 12.7 % (ref 11.5–15.5)
WBC: 20.2 K/uL — ABNORMAL HIGH (ref 4.0–10.5)
nRBC: 0 % (ref 0.0–0.2)

## 2023-12-03 LAB — BASIC METABOLIC PANEL WITH GFR
Anion gap: 15 (ref 5–15)
BUN: 7 mg/dL (ref 6–20)
CO2: 20 mmol/L — ABNORMAL LOW (ref 22–32)
Calcium: 9 mg/dL (ref 8.9–10.3)
Chloride: 101 mmol/L (ref 98–111)
Creatinine, Ser: 0.96 mg/dL (ref 0.44–1.00)
GFR, Estimated: >60 mL/min (ref >60)
Glucose, Bld: 146 mg/dL — ABNORMAL HIGH (ref 70–99)
Potassium: 3.7 mmol/L (ref 3.5–5.1)
Sodium: 136 mmol/L (ref 135–145)

## 2023-12-03 LAB — GLUCOSE, CAPILLARY
Glucose-Capillary: 121 mg/dL — ABNORMAL HIGH (ref 70–99)
Glucose-Capillary: 133 mg/dL — ABNORMAL HIGH (ref 70–99)
Glucose-Capillary: 128 mg/dL — ABNORMAL HIGH (ref 70–99)
Glucose-Capillary: 129 mg/dL — ABNORMAL HIGH (ref 70–99)

## 2023-12-03 LAB — CA 125: Cancer Antigen (CA) 125: 11.7 U/mL (ref 0.0–38.1)

## 2023-12-03 MED ORDER — HYDROMORPHONE HCL 1 MG/ML IJ SOLN
0.5000 mg | Freq: Three times a day (TID) | INTRAMUSCULAR | Status: DC | PRN
  Administered 2023-12-03 – 2023-12-04 (×4): 0.5 mg via INTRAVENOUS
  Filled 2023-12-03 (×4): qty 0.5

## 2023-12-03 MED ORDER — TRAMADOL HCL 50 MG PO TABS
50.0000 mg | ORAL_TABLET | Freq: Four times a day (QID) | ORAL | Status: DC | PRN
  Administered 2023-12-03 – 2023-12-04 (×4): 50 mg via ORAL
  Filled 2023-12-03 (×5): qty 1

## 2023-12-03 MED ORDER — PANTOPRAZOLE SODIUM 40 MG PO TBEC
40.0000 mg | DELAYED_RELEASE_TABLET | Freq: Every day | ORAL | Status: DC
Start: 2023-12-03 — End: 2023-12-17
  Administered 2023-12-03 – 2023-12-15 (×11): 40 mg via ORAL
  Filled 2023-12-03 (×12): qty 1

## 2023-12-03 MED ORDER — CHLORTHALIDONE 25 MG PO TABS
25.0000 mg | ORAL_TABLET | Freq: Every day | ORAL | Status: DC
  Administered 2023-12-03 – 2023-12-23 (×17): 25 mg via ORAL
  Filled 2023-12-03 (×23): qty 1

## 2023-12-03 MED ORDER — ALUM & MAG HYDROXIDE-SIMETH 200-200-20 MG/5ML PO SUSP
30.0000 mL | ORAL | Status: DC | PRN
  Administered 2023-12-03: 30 mL via ORAL
  Filled 2023-12-03: qty 30

## 2023-12-03 MED ORDER — ATORVASTATIN CALCIUM 80 MG PO TABS
80.0000 mg | ORAL_TABLET | Freq: Every day | ORAL | Status: DC
  Administered 2023-12-03 – 2023-12-23 (×16): 80 mg via ORAL
  Filled 2023-12-03: qty 2
  Filled 2023-12-03 (×3): qty 1
  Filled 2023-12-03: qty 2
  Filled 2023-12-03 (×17): qty 1

## 2023-12-03 NOTE — Progress Notes (Signed)
 PROGRESS NOTE    Misty Walsh  FMW:[id number] DOB: 11-30-1990 DOA: 12/01/2023 PCP: Buck Search, PA-C   Brief Narrative:  33 year old woman PMH including diabetes, presented with abdominal pain nausea and dry heaving -recently evaluated at urgent care for similar episode including dysuria and vaginal discharge.  Patient was treated for E. coli UTI noted on cultures at that time with cefdinir  but patient was unable to tolerate this due to worsening nausea and presented to our facility for further recommendations.  Hospitalist called for admission, gynecology called in consult.  Assessment & Plan:   Principal Problem:   Sepsis due to gram-negative UTI (HCC) Active Problems:   Type 2 diabetes mellitus without complication, without long-term current use of insulin  (HCC)   Tubo-ovarian abscess   Acute pyelonephritis   BMI 50.0-59.9, adult (HCC)   Obesity, class 3 (HCC)  Sepsis - multifactorial, POA Secondary to suspected tubo-ovarian abscess given recent UTI, POA Acute pyelonephritis, POA - Urine cultures 10/10 prior to admission with E. Coli - notably febrile, tachycardic, with leukocytosis meeting criteria for sepsis at intake. - Patient continues on ceftriaxone , doxycycline , Flagyl  for broad-spectrum coverage pending cultures - Gynecology consulted 10/14 - initially recommending intervention via drain with interventional radiology, unfortunately patient has no appropriate window for drain placement per further discussion with IR.  Gynecology to further evaluate this afternoon - appreciate insight/recs. - Given large size of presumed abscess would imaging conservative therapy would be insufficient given ongoing pain, fevers, and worsening leukocytosis. - Further discussion with OB/GYN, will transfer patient over to main campus in hopes that they will be able to see her more quickly  for further evaluation and treatment.  Nausea, diarrhea, resolved - Antiemetics ongoing - well  controlled at this time - PO intake remains quite poor - encouraged to advance diet as tolerated. - Cdiff negative  Diabetes mellitus type 2, well controlled - CBG stable.  Sliding scale insulin /hypoglycemic protocol ongoing - A1C 6.3   Class 3 obesity - Body mass index is 51.49 kg/m.  DVT prophylaxis: Lovenox Code Status:   Code Status: Full Code Family Communication: None present  Status is: Inpatient  Dispo: The patient is from: Home              Anticipated d/c is to: Home              Anticipated d/c date is: To be determined              Patient currently not medically stable for discharge  Consultants:  OB/GYN, interventional radiology  Procedures:  Pending further evaluation  Antimicrobials:  Ceftriaxone , doxycycline , Flagyl   Subjective: Overnight patient continues to have abdominal pain and worsening headache not improved with current regimen, will transition from Toradol  to tramadol  and follow clinically.  Objective: Vitals:   12/03/23 0000 12/03/23 0247 12/03/23 0400 12/03/23 0459  BP:  (!) 157/65  (!) 173/94  Pulse:  (!) 110  100  Resp: (!) 34 14 19   Temp:  (!) 101.4 F (38.6 C)  100.3 F (37.9 C)  TempSrc:  Oral  Oral  SpO2:  96%  94%  Weight:      Height:        Intake/Output Summary (Last 24 hours) at 12/03/2023 0719 Last data filed at 12/03/2023 9372 Gross per 24 hour  Intake 2030.95 ml  Output 30 ml  Net 2000.95 ml   Filed Weights   12/01/23 1243  Weight: 136.1 kg    Examination:  General:  Pleasantly  resting in bed, No acute distress. HEENT:  Normocephalic atraumatic.  Sclerae nonicteric, noninjected.  Extraocular movements intact bilaterally. Neck:  Without mass or deformity.  Trachea is midline. Lungs:  Clear to auscultate bilaterally without rhonchi, wheeze, or rales. Heart:  Regular rate and rhythm.  Without murmurs, rubs, or gallops. Abdomen: Obese, markedly tender  suprapubically to the umbilicus without rebound or  guarding.. Extremities: Without cyanosis, clubbing, edema, or obvious deformity. Skin:  Warm and dry, no erythema.  Data Reviewed: I have personally reviewed following labs and imaging studies  CBC: Recent Labs  Lab 12/01/23 1312 12/03/23 0357  WBC 21.7* 20.2*  NEUTROABS 19.2*  --   HGB 12.7 10.4*  HCT 39.5 35.2*  MCV 93.8 100.3*  PLT 262 238   Basic Metabolic Panel: Recent Labs  Lab 12/01/23 1312 12/02/23 0003 12/03/23 0357  NA 131*  --  136  K 3.2*  --  3.7  CL 93*  --  101  CO2 22  --  20*  GLUCOSE 201*  --  146*  BUN 9  --  7  CREATININE 1.17*  --  0.96  CALCIUM  9.7  --  9.0  MG  --  2.2  --    GFR: Estimated Creatinine Clearance: 113.8 mL/min (by C-G formula based on SCr of 0.96 mg/dL). Liver Function Tests: Recent Labs  Lab 12/01/23 1312  AST 26  ALT 29  ALKPHOS 93  BILITOT 0.8  PROT 8.1  ALBUMIN 3.8   No results for input(s): LIPASE, AMYLASE in the last 168 hours. No results for input(s): AMMONIA in the last 168 hours. Coagulation Profile: Recent Labs  Lab 12/01/23 1312  INR 1.3*   Cardiac Enzymes: Recent Labs  Lab 12/01/23 1312  CKTOTAL 162   BNP (last 3 results) No results for input(s): PROBNP in the last 8760 hours. HbA1C: Recent Labs    12/02/23 0003  HGBA1C 6.3*   CBG: Recent Labs  Lab 12/02/23 0735 12/02/23 1127 12/02/23 1654 12/02/23 2046 12/03/23 0717  GLUCAP 130* 149* 150* 138* 121*   Lipid Profile: No results for input(s): CHOL, HDL, LDLCALC, TRIG, CHOLHDL, LDLDIRECT in the last 72 hours. Thyroid Function Tests: No results for input(s): TSH, T4TOTAL, FREET4, T3FREE, THYROIDAB in the last 72 hours. Anemia Panel: No results for input(s): VITAMINB12, FOLATE, FERRITIN, TIBC, IRON, RETICCTPCT in the last 72 hours. Sepsis Labs: Recent Labs  Lab 12/01/23 1333  LATICACIDVEN 1.8    Recent Results (from the past 240 hours)  Culture, blood (Routine x 2)     Status: None  (Preliminary result)   Collection Time: 12/01/23  1:12 PM   Specimen: BLOOD LEFT HAND  Result Value Ref Range Status   Specimen Description   Final    BLOOD LEFT HAND Performed at [facility], [address], [place], [place] [address]    Special Requests   Final    BOTTLES DRAWN AEROBIC AND ANAEROBIC Blood Culture adequate volume Performed at [facility], [address] [place], [place] [address]    Culture   Final    NO GROWTH 2 DAYS Performed at [facility], [address], [place], [place] [address]    Report Status PENDING  Incomplete  Culture, blood (Routine x 2)     Status: None (Preliminary result)   Collection Time: 12/01/23  1:12 PM   Specimen: BLOOD  Result Value Ref Range Status   Specimen Description   Final    BLOOD RIGHT ANTECUBITAL Performed  at [facility], [address] [place], [place] [address]    Special Requests   Final    BOTTLES DRAWN AEROBIC AND ANAEROBIC Blood Culture adequate volume Performed at [facility], [address] [place], [place] [address]    Culture   Final    NO GROWTH 2 DAYS Performed at [facility], [address], [place], [place] [address]    Report Status PENDING  Incomplete  Resp panel by RT-PCR (RSV, Flu A&B, Covid) Anterior Nasal Swab     Status: None   Collection Time: 12/01/23  1:12 PM   Specimen: Anterior Nasal Swab  Result Value Ref Range Status   SARS Coronavirus 2 by RT PCR NEGATIVE NEGATIVE Final    Comment: (NOTE) SARS-CoV-2 target nucleic acids are NOT DETECTED.  The SARS-CoV-2 RNA is generally detectable in upper respiratory specimens during the acute phase of infection. The lowest concentration of SARS-CoV-2 viral copies this assay can detect is 138 copies/mL. A negative result does not preclude SARS-Cov-2 infection and should not be used as the sole basis for treatment or other patient management decisions. A negative result may  occur with  improper specimen collection/handling, submission of specimen other than nasopharyngeal swab, presence of viral mutation(s) within the areas targeted by this assay, and inadequate number of viral copies(<138 copies/mL). A negative result must be combined with clinical observations, patient history, and epidemiological information. The expected result is Negative.  Fact Sheet for Patients:  BloggerCourse.com  Fact Sheet for Healthcare Providers:  SeriousBroker.it  This test is no t yet approved or cleared by the [place]  FDA and  has been authorized for detection and/or diagnosis of SARS-CoV-2 by FDA under an Emergency Use Authorization (EUA). This EUA will remain  in effect (meaning this test can be used) for the duration of the COVID-19 declaration under Section 564(b)(1) of the Act, 21 U.S.C.section 360bbb-3(b)(1), unless the authorization is terminated  or revoked sooner.       Influenza A by PCR NEGATIVE NEGATIVE Final   Influenza B by PCR NEGATIVE NEGATIVE Final    Comment: (NOTE) The Xpert Xpress SARS-CoV-2/FLU/RSV plus assay is intended as an aid in the diagnosis of influenza from Nasopharyngeal swab specimens and should not be used as a sole basis for treatment. Nasal washings and aspirates are unacceptable for Xpert Xpress SARS-CoV-2/FLU/RSV testing.  Fact Sheet for Patients: BloggerCourse.com  Fact Sheet for Healthcare Providers: SeriousBroker.it  This test is not yet approved or cleared by the [place]  FDA and has been authorized for detection and/or diagnosis of SARS-CoV-2 by FDA under an Emergency Use Authorization (EUA). This EUA will remain in effect (meaning this test can be used) for the duration of the COVID-19 declaration under Section 564(b)(1) of the Act, 21 U.S.C. section 360bbb-3(b)(1), unless the authorization is terminated  or revoked.     Resp Syncytial Virus by PCR NEGATIVE NEGATIVE Final    Comment: (NOTE) Fact Sheet for Patients: BloggerCourse.com  Fact Sheet for Healthcare Providers: SeriousBroker.it  This test is not yet approved or cleared by the [place]  FDA and has been authorized for detection and/or diagnosis of SARS-CoV-2 by FDA under an Emergency Use Authorization (EUA). This EUA will remain in effect (meaning this test can be used) for the duration of the COVID-19 declaration under Section 564(b)(1) of the Act, 21 U.S.C. section 360bbb-3(b)(1), unless the authorization is terminated or revoked.  Performed at [facility], [address], [place], [place] [address]  Gastrointestinal Panel by PCR , Stool     Status: None   Collection Time: 12/02/23  9:00 AM   Specimen: Stool  Result Value Ref Range Status   Campylobacter species NOT DETECTED NOT DETECTED Final   Plesimonas shigelloides NOT DETECTED NOT DETECTED Final   Salmonella species NOT DETECTED NOT DETECTED Final   Yersinia enterocolitica NOT DETECTED NOT DETECTED Final   Vibrio species NOT DETECTED NOT DETECTED Final   Vibrio cholerae NOT DETECTED NOT DETECTED Final   Enteroaggregative E coli (EAEC) NOT DETECTED NOT DETECTED Final   Enteropathogenic E coli (EPEC) NOT DETECTED NOT DETECTED Final   Enterotoxigenic E coli (ETEC) NOT DETECTED NOT DETECTED Final   Shiga like toxin producing E coli (STEC) NOT DETECTED NOT DETECTED Final   Shigella/Enteroinvasive E coli (EIEC) NOT DETECTED NOT DETECTED Final   Cryptosporidium NOT DETECTED NOT DETECTED Final   Cyclospora cayetanensis NOT DETECTED NOT DETECTED Final   Entamoeba histolytica NOT DETECTED NOT DETECTED Final   Giardia lamblia NOT DETECTED NOT DETECTED Final   Adenovirus F40/41 NOT DETECTED NOT DETECTED Final   Astrovirus NOT DETECTED NOT DETECTED Final   Norovirus GI/GII NOT DETECTED NOT DETECTED  Final   Rotavirus A NOT DETECTED NOT DETECTED Final   Sapovirus (I, II, IV, and V) NOT DETECTED NOT DETECTED Final    Comment: Performed at [facility], [address] [place], [place] [address]  C Difficile Quick Screen w PCR reflex     Status: None   Collection Time: 12/02/23 11:12 AM   Specimen: STOOL  Result Value Ref Range Status   C Diff antigen NEGATIVE NEGATIVE Final   C Diff toxin NEGATIVE NEGATIVE Final   C Diff interpretation No C. difficile detected.  Final    Comment: Performed at [facility], [address], [place], [place] [address]         Radiology Studies: MR PELVIS W WO CONTRAST Result Date: 12/02/2023 CLINICAL DATA:  Pelvic pain, nausea, and vomiting. Pelvic mass on recent CT. EXAM: MRI PELVIS WITHOUT AND WITH CONTRAST TECHNIQUE: Multiplanar multisequence MR imaging of the pelvis was performed both before and after administration of intravenous contrast. CONTRAST:  10mL GADAVIST GADOBUTROL 1 MMOL/ML IV SOLN COMPARISON:  CT on 12/01/2023 FINDINGS: Lower Urinary Tract: Bladder is displaced anteriorly by large cystic mass in central pelvis, however, no intrinsic bladder abnormality identified. Bowel: Unremarkable pelvic bowel loops. Vascular/Lymphatic: Unremarkable. No pathologically enlarged pelvic lymph nodes identified. Reproductive: -- Uterus: Measures 11.2 x 3.5 by 5.5 cm. Displaced anteriorly and to the left by large cystic mass in central pelvis. No otherwise unremarkable in appearance. -- Right ovary: No normal ovary visualized. A complex cystic lesion is seen which is centered in the right adnexa, displacing the rectum and uterus to the left. This lesion contains multiple internal cystic components with T1 hypointense fluid and peripheral enhancement, some of which appear tubular in shape. An internal air-fluid level is also noted. There is surrounding edema and tiny amount of free fluid. This measures 22.4 by 8.0 by 9.8 cm. This is  highly suspicious for a large tubo-ovarian abscess, with cystic ovarian neoplasm and endometriosis considered less likely. -- Left ovary:  No normal ovary visualized. Other: Tiny amount of free fluid seen in intraperitoneal and presacral spaces. Musculoskeletal:  Unremarkable. IMPRESSION: 22.4 cm complex cystic mass centered in right adnexa. Features are highly suspicious for a large tubo-ovarian abscess, with cystic ovarian neoplasm and endometriosis considered less likely. Electronically Signed   By: Norleen  DELENA Kil M.D.   On: 12/02/2023 16:13   CT ABDOMEN PELVIS W CONTRAST Result Date: 12/01/2023 EXAM: CT ABDOMEN AND PELVIS WITH CONTRAST 12/01/2023 04:32:27 PM TECHNIQUE: CT of the abdomen and pelvis was performed with the administration of 100 mL of iohexol  (OMNIPAQUE ) 300 MG/ML solution. Multiplanar reformatted images are provided for review. Automated exposure control, iterative reconstruction, and/or weight-based adjustment of the mA/kV was utilized to reduce the radiation dose to as low as reasonably achievable. COMPARISON: Comparison with 02/13/2022 and ultrasound 08/14/2022. CLINICAL HISTORY: N/V. Eval for pyelonephritis vs nephrolithiasis. Triage Notes: Patient BIB [facility] from home. Abdominal pain for 5 days, nausea and vomiting. 3 days ago prescribed antibiotic for UTI but trouble keeping the medication down. Said her urine culture came back positive today. Had covid 1 week ago. FINDINGS: LOWER CHEST: No acute abnormality. LIVER: Hepatic steatosis. GALLBLADDER AND BILE DUCTS: Gallbladder is unremarkable. No biliary ductal dilatation. SPLEEN: No acute abnormality. PANCREAS: No acute abnormality. ADRENAL GLANDS: No acute abnormality. KIDNEYS, URETERS AND BLADDER: No stones in the kidneys or ureters. No hydronephrosis. No perinephric or periureteral stranding. Urinary bladder is unremarkable. GI AND BOWEL: Stomach demonstrates no acute abnormality. There is no bowel obstruction. Normal appendix. PERITONEUM  AND RETROPERITONEUM: No ascites. No free air. VASCULATURE: Aorta is normal in caliber. LYMPH NODES: No lymphadenopathy. REPRODUCTIVE ORGANS: Right hydrosalpinx has increased compared to 02/13/2022. Comparison with ultrasound 08/14/2022 is limited due to differences in technique. Complex right ovarian cystic lesion has increased from the prior CT and measures 6.1 x 5.2 cm. There is a new low-density cystic lesion measuring 7.2 x 5.3 x 7.7 cm. The lesion is superior to the uterus and bladder. The origin is unclear. This may arise from the complex right paraovarian cyst or the left ovary. BONES AND SOFT TISSUES: No acute osseous abnormality. No focal soft tissue abnormality. IMPRESSION: 1. New low-density cystic lesion measuring 7.7 cm, superior to the uterus and bladder, with unclear origin; may arise from the complex right paraovarian cyst or the left ovary. 2. Complex right ovarian cystic lesion has increased from the prior CT and measures 6.1 cm. 3. Right hydrosalpinx has increased compared to prior CT. 4. Recommend gynecologic consultation and/or MRI w/ IV contrast for further characterization . Electronically signed by: Norman Gatlin MD 12/01/2023 05:20 PM EDT RP Workstation: [id number]   DG Chest Portable 1 View Result Date: 12/01/2023 CLINICAL DATA:  Shortness of breath, sepsis EXAM: PORTABLE CHEST 1 VIEW COMPARISON:  09/19/2016 FINDINGS: Low lung volumes are present, causing crowding of the pulmonary vasculature. Bandlike opacity just above the left hemidiaphragm probably representing atelectasis in the left lower lobe. Hazy density along the right hemithorax likely mostly attributable to soft tissues of the chest wall and the underlying low lung volumes, no discrete airspace opacity identified on the right. No blunting the costophrenic angles. No significant bony findings. IMPRESSION: 1. Low lung volumes are present, causing crowding of the pulmonary vasculature. 2. Bandlike opacity just above the left  hemidiaphragm probably representing atelectasis in the left lower lobe. Electronically Signed   By: Ryan Salvage M.D.   On: 12/01/2023 13:44        Scheduled Meds:  enoxaparin (LOVENOX) injection  60 mg Subcutaneous Q24H   insulin  aspart  0-15 Units Subcutaneous TID WC   Continuous Infusions:  cefTRIAXone  (ROCEPHIN )  IV Stopped (12/02/23 2109)   doxycycline  (VIBRAMYCIN ) IV 125 mL/hr at 12/03/23 0627   metronidazole  100 mL/hr at 12/03/23 0627     LOS: 2 days   Time spent:   Misty JAYSON Montclair, DO [facility]  If 7PM-7AM, please contact night-coverage [email or web address]  12/03/2023, 7:19 AM

## 2023-12-03 NOTE — Progress Notes (Signed)
   12/03/23 0932  TOC Brief Assessment  Insurance and Status Lapsed  Patient has primary care physician Yes  Home environment has been reviewed Resides in an apartment with child(ren)  Prior level of function: Independent with ADLs at baseline  Prior/Current Home Services No current home services  Social Drivers of Health Review SDOH reviewed no interventions necessary  Readmission risk has been reviewed Yes  Transition of care needs no transition of care needs at this time

## 2023-12-03 NOTE — Plan of Care (Signed)
  Problem: Skin Integrity: Goal: Risk for impaired skin integrity will decrease Outcome: Progressing   Problem: Education: Goal: Knowledge of General Education information will improve Description: Including pain rating scale, medication(s)/side effects and non-pharmacologic comfort measures Outcome: Progressing   Problem: Activity: Goal: Risk for activity intolerance will decrease Outcome: Progressing   Problem: Elimination: Goal: Will not experience complications related to bowel motility Outcome: Progressing Goal: Will not experience complications related to urinary retention Outcome: Progressing   Problem: Coping: Goal: Ability to adjust to condition or change in health will improve Outcome: Not Progressing   Problem: Fluid Volume: Goal: Ability to maintain a balanced intake and output will improve Outcome: Not Progressing

## 2023-12-04 LAB — BASIC METABOLIC PANEL WITH GFR
Anion gap: 15 (ref 5–15)
BUN: 7 mg/dL (ref 6–20)
CO2: 23 mmol/L (ref 22–32)
Calcium: 9.4 mg/dL (ref 8.9–10.3)
Chloride: 100 mmol/L (ref 98–111)
Creatinine, Ser: 1 mg/dL (ref 0.44–1.00)
GFR, Estimated: >60 mL/min (ref >60)
Glucose, Bld: 136 mg/dL — ABNORMAL HIGH (ref 70–99)
Potassium: 3.2 mmol/L — ABNORMAL LOW (ref 3.5–5.1)
Sodium: 138 mmol/L (ref 135–145)

## 2023-12-04 LAB — CBC
HCT: 31.2 % — ABNORMAL LOW (ref 36.0–46.0)
Hemoglobin: 10 g/dL — ABNORMAL LOW (ref 12.0–15.0)
MCH: 30.7 pg (ref 26.0–34.0)
MCHC: 32.1 g/dL (ref 30.0–36.0)
MCV: 95.7 fL (ref 80.0–100.0)
Platelets: 261 K/uL (ref 150–400)
RBC: 3.26 MIL/uL — ABNORMAL LOW (ref 3.87–5.11)
RDW: 12.8 % (ref 11.5–15.5)
WBC: 21.1 K/uL — ABNORMAL HIGH (ref 4.0–10.5)
nRBC: 0 % (ref 0.0–0.2)

## 2023-12-04 LAB — GLUCOSE, CAPILLARY
Glucose-Capillary: 124 mg/dL — ABNORMAL HIGH (ref 70–99)
Glucose-Capillary: 139 mg/dL — ABNORMAL HIGH (ref 70–99)
Glucose-Capillary: 126 mg/dL — ABNORMAL HIGH (ref 70–99)

## 2023-12-04 MED ORDER — ACETAMINOPHEN 325 MG PO TABS
ORAL_TABLET | ORAL | Status: AC
  Filled 2023-12-04: qty 2

## 2023-12-04 MED ORDER — OXYCODONE HCL 5 MG PO TABS
5.0000 mg | ORAL_TABLET | ORAL | Status: DC | PRN
  Administered 2023-12-04 – 2023-12-07 (×2): 5 mg via ORAL
  Administered 2023-12-09 – 2023-12-13 (×5): 10 mg via ORAL
  Administered 2023-12-13: 5 mg via ORAL
  Administered 2023-12-13 – 2023-12-16 (×4): 10 mg via ORAL
  Administered 2023-12-17: 5 mg via ORAL
  Administered 2023-12-18 – 2023-12-20 (×5): 10 mg via ORAL
  Administered 2023-12-21: 5 mg via ORAL
  Administered 2023-12-21 – 2023-12-23 (×4): 10 mg via ORAL
  Filled 2023-12-04: qty 2
  Filled 2023-12-04: qty 1
  Filled 2023-12-04 (×3): qty 2
  Filled 2023-12-04: qty 1
  Filled 2023-12-04 (×5): qty 2
  Filled 2023-12-04 (×2): qty 1
  Filled 2023-12-04 (×4): qty 2
  Filled 2023-12-04 (×2): qty 1
  Filled 2023-12-04: qty 2
  Filled 2023-12-04: qty 1
  Filled 2023-12-04 (×7): qty 2

## 2023-12-04 NOTE — Progress Notes (Signed)
 Pt has orders to transfer to [facility] that came through on previous shift to have her needs addressed more appropriately. NS called unit along with [facility] to notify of pt's transfer on this shift. Unit expressed that they were not aware of a pt transferring to that facility and there was no one able to take report for pt during this shift. Pt was notified of update and expressed that she had no knowledge of her needing to be transferred. Will pass on to oncoming shift. No acute distress noted.

## 2023-12-04 NOTE — Progress Notes (Signed)
 Pt is alert and oriented with stable vital signs and was transferred to [facility] at 5:15pm by [facility] transporter.

## 2023-12-04 NOTE — Progress Notes (Signed)
 PROGRESS NOTE    Misty Walsh  FMW:[id number] DOB: 1991-02-07 DOA: 12/01/2023 PCP: Buck Search, PA-C   Brief Narrative:  33 year old woman PMH including diabetes, presented with abdominal pain nausea and dry heaving -recently evaluated at urgent care for similar episode including dysuria and vaginal discharge.  Patient was treated for E. coli UTI noted on cultures at that time with cefdinir  but patient was unable to tolerate this due to worsening nausea and presented to our facility for further recommendations.  Hospitalist called for admission, gynecology called in consult.  Assessment & Plan:   Principal Problem:   Sepsis due to gram-negative UTI [facility]) Active Problems:   Type 2 diabetes mellitus without complication, without long-term current use of insulin  (HCC)   Tubo-ovarian abscess   Acute pyelonephritis   BMI 50.0-59.9, adult (HCC)   Obesity, class 3 (HCC)  Sepsis - multifactorial, POA Secondary to suspected tubo-ovarian abscess given recent UTI, POA Acute pyelonephritis, POA - Urine cultures 10/10 prior to admission with E. Coli - notably febrile, tachycardic, with leukocytosis meeting criteria for sepsis at intake. - Patient continues on ceftriaxone , doxycycline , Flagyl  for broad-spectrum coverage pending cultures - Gynecology consulted 10/14 - initially recommending intervention via drain with interventional radiology, unfortunately patient has no appropriate window for drain placement per further discussion with IR.  Gynecology to further evaluate this afternoon - appreciate insight/recs. - Given large size of presumed abscess would imaging conservative therapy would be insufficient given ongoing pain, fevers, and worsening leukocytosis. - Further discussion with OB/GYN, will transfer patient over to main campus in hopes that they will be able to see her more quickly  for further evaluation and treatment.  Intractable abdominal pain, flank pain  - Secondary to above,  initiate oral oxycodone  with ongoing IV Dilaudid   Nausea, diarrhea, resolved - Antiemetics ongoing - well controlled at this time - PO intake remains quite poor - encouraged to advance diet as tolerated. - Cdiff negative  Diabetes mellitus type 2, moderately well controlled - CBG stable.  Sliding scale insulin /hypoglycemic protocol ongoing - A1C 6.3   Class 3 obesity - Body mass index is 51.49 kg/m.  DVT prophylaxis: Lovenox Code Status:   Code Status: Full Code Family Communication: None present  Status is: Inpatient  Dispo: The patient is from: Home              Anticipated d/c is to: Home              Anticipated d/c date is: To be determined              Patient currently not medically stable for discharge  Consultants:  OB/GYN, interventional radiology  Procedures:  Pending further evaluation  Antimicrobials:  Ceftriaxone , doxycycline , Flagyl   Subjective: Overnight patient continues to have abdominal pain, fevers, and occasional nausea but notes her headache is improved.  She denies any vomiting diarrhea constipation or chest pain.  Objective: Vitals:   12/04/23 0116 12/04/23 0544 12/04/23 0859 12/04/23 1154  BP: (!) 146/73 (!) 154/86 (!) 183/78 (!) 151/77  Pulse: (!) 110 (!) 103 (!) 107 (!) 106  Resp: 20 18 16    Temp: (!) 102.2 F (39 C) 100.2 F (37.9 C) 99.6 F (37.6 C)   TempSrc: Oral Oral Oral   SpO2: 100% 96% 100%   Weight:      Height:       No intake or output data in the 24 hours ending 12/04/23 1227  Filed Weights   12/01/23 1243  Weight: 136.1  kg    Examination:  General:  Pleasantly resting in bed, No acute distress. HEENT:  Normocephalic atraumatic.  Sclerae nonicteric, noninjected.  Extraocular movements intact bilaterally. Neck:  Without mass or deformity.  Trachea is midline. Lungs:  Clear to auscultate bilaterally without rhonchi, wheeze, or rales. Heart:  Regular rate and rhythm.  Without murmurs, rubs, or gallops. Abdomen:  Obese, markedly tender supra-pubically to the umbilicus without rebound or guarding. Extremities: Without cyanosis, clubbing, edema, or obvious deformity. Skin:  Warm and dry, no erythema.  Data Reviewed: I have personally reviewed following labs and imaging studies  CBC: Recent Labs  Lab 12/01/23 1312 12/03/23 0357 12/04/23 0351  WBC 21.7* 20.2* 21.1*  NEUTROABS 19.2*  --   --   HGB 12.7 10.4* 10.0*  HCT 39.5 35.2* 31.2*  MCV 93.8 100.3* 95.7  PLT 262 238 261   Basic Metabolic Panel: Recent Labs  Lab 12/01/23 1312 12/02/23 0003 12/03/23 0357 12/04/23 0351  NA 131*  --  136 138  K 3.2*  --  3.7 3.2*  CL 93*  --  101 100  CO2 22  --  20* 23  GLUCOSE 201*  --  146* 136*  BUN 9  --  7 7  CREATININE 1.17*  --  0.96 1.00  CALCIUM  9.7  --  9.0 9.4  MG  --  2.2  --   --    GFR: Estimated Creatinine Clearance: 109.2 mL/min (by C-G formula based on SCr of 1 mg/dL).  Liver Function Tests: Recent Labs  Lab 12/01/23 1312  AST 26  ALT 29  ALKPHOS 93  BILITOT 0.8  PROT 8.1  ALBUMIN 3.8   Coagulation Profile: Recent Labs  Lab 12/01/23 1312  INR 1.3*   Cardiac Enzymes: Recent Labs  Lab 12/01/23 1312  CKTOTAL 162   HbA1C: Recent Labs    12/02/23 0003  HGBA1C 6.3*   CBG: Recent Labs  Lab 12/03/23 1150 12/03/23 1635 12/03/23 2030 12/04/23 0714 12/04/23 1107  GLUCAP 133* 128* 129* 124* 139*   Sepsis Labs: Recent Labs  Lab 12/01/23 1333  LATICACIDVEN 1.8    Recent Results (from the past 240 hours)  Culture, blood (Routine x 2)     Status: None (Preliminary result)   Collection Time: 12/01/23  1:12 PM   Specimen: BLOOD LEFT HAND  Result Value Ref Range Status   Specimen Description   Final    BLOOD LEFT HAND Performed at [facility], [address] [place], [place] [address]    Special Requests   Final    BOTTLES DRAWN AEROBIC AND ANAEROBIC Blood Culture adequate volume Performed at [facility], [address] [place], [place] [address]    Culture   Final    NO GROWTH 3 DAYS Performed at [facility], [address] [place], [place] [address]    Report Status PENDING  Incomplete  Culture, blood (Routine x 2)     Status: None (Preliminary result)   Collection Time: 12/01/23  1:12 PM   Specimen: BLOOD  Result Value Ref Range Status   Specimen Description   Final    BLOOD RIGHT ANTECUBITAL Performed at [facility], [address] [place], [place] [address]    Special Requests   Final    BOTTLES DRAWN AEROBIC AND ANAEROBIC Blood Culture adequate volume Performed at [facility], [address] [place], [place] [address]    Culture   Final  NO GROWTH 3 DAYS Performed at [facility], [address] [place], [place] [address]    Report Status PENDING  Incomplete  Resp panel by RT-PCR (RSV, Flu A&B, Covid) Anterior Nasal Swab     Status: None   Collection Time: 12/01/23  1:12 PM   Specimen: Anterior Nasal Swab  Result Value Ref Range Status   SARS Coronavirus 2 by RT PCR NEGATIVE NEGATIVE Final    Comment: (NOTE) SARS-CoV-2 target nucleic acids are NOT DETECTED.  The SARS-CoV-2 RNA is generally detectable in upper respiratory specimens during the acute phase of infection. The lowest concentration of SARS-CoV-2 viral copies this assay can detect is 138 copies/mL. A negative result does not preclude SARS-Cov-2 infection and should not be used as the sole basis for treatment or other patient management decisions. A negative result may occur with  improper specimen collection/handling, submission of specimen other than nasopharyngeal swab, presence of viral mutation(s) within the areas targeted by this assay, and inadequate number of viral copies(<138 copies/mL). A negative result must be combined with clinical observations, patient history, and epidemiological information. The expected result is Negative.  Fact Sheet for Patients:   BloggerCourse.com  Fact Sheet for Healthcare Providers:  SeriousBroker.it  This test is no t yet approved or cleared by the [place]  FDA and  has been authorized for detection and/or diagnosis of SARS-CoV-2 by FDA under an Emergency Use Authorization (EUA). This EUA will remain  in effect (meaning this test can be used) for the duration of the COVID-19 declaration under Section 564(b)(1) of the Act, 21 U.S.C.section 360bbb-3(b)(1), unless the authorization is terminated  or revoked sooner.       Influenza A by PCR NEGATIVE NEGATIVE Final   Influenza B by PCR NEGATIVE NEGATIVE Final    Comment: (NOTE) The Xpert Xpress SARS-CoV-2/FLU/RSV plus assay is intended as an aid in the diagnosis of influenza from Nasopharyngeal swab specimens and should not be used as a sole basis for treatment. Nasal washings and aspirates are unacceptable for Xpert Xpress SARS-CoV-2/FLU/RSV testing.  Fact Sheet for Patients: BloggerCourse.com  Fact Sheet for Healthcare Providers: SeriousBroker.it  This test is not yet approved or cleared by the [place]  FDA and has been authorized for detection and/or diagnosis of SARS-CoV-2 by FDA under an Emergency Use Authorization (EUA). This EUA will remain in effect (meaning this test can be used) for the duration of the COVID-19 declaration under Section 564(b)(1) of the Act, 21 U.S.C. section 360bbb-3(b)(1), unless the authorization is terminated or revoked.     Resp Syncytial Virus by PCR NEGATIVE NEGATIVE Final    Comment: (NOTE) Fact Sheet for Patients: BloggerCourse.com  Fact Sheet for Healthcare Providers: SeriousBroker.it  This test is not yet approved or cleared by the [place]  FDA and has been authorized for detection and/or diagnosis of SARS-CoV-2 by FDA under an Emergency Use  Authorization (EUA). This EUA will remain in effect (meaning this test can be used) for the duration of the COVID-19 declaration under Section 564(b)(1) of the Act, 21 U.S.C. section 360bbb-3(b)(1), unless the authorization is terminated or revoked.  Performed at [facility], [address] [place], [place] [address]   Gastrointestinal Panel by PCR , Stool     Status: None   Collection Time: 12/02/23  9:00 AM   Specimen: Stool  Result Value Ref Range Status   Campylobacter species NOT DETECTED NOT DETECTED Final   Plesimonas shigelloides NOT DETECTED NOT DETECTED Final   Salmonella species NOT  DETECTED NOT DETECTED Final   Yersinia enterocolitica NOT DETECTED NOT DETECTED Final   Vibrio species NOT DETECTED NOT DETECTED Final   Vibrio cholerae NOT DETECTED NOT DETECTED Final   Enteroaggregative E coli (EAEC) NOT DETECTED NOT DETECTED Final   Enteropathogenic E coli (EPEC) NOT DETECTED NOT DETECTED Final   Enterotoxigenic E coli (ETEC) NOT DETECTED NOT DETECTED Final   Shiga like toxin producing E coli (STEC) NOT DETECTED NOT DETECTED Final   Shigella/Enteroinvasive E coli (EIEC) NOT DETECTED NOT DETECTED Final   Cryptosporidium NOT DETECTED NOT DETECTED Final   Cyclospora cayetanensis NOT DETECTED NOT DETECTED Final   Entamoeba histolytica NOT DETECTED NOT DETECTED Final   Giardia lamblia NOT DETECTED NOT DETECTED Final   Adenovirus F40/41 NOT DETECTED NOT DETECTED Final   Astrovirus NOT DETECTED NOT DETECTED Final   Norovirus GI/GII NOT DETECTED NOT DETECTED Final   Rotavirus A NOT DETECTED NOT DETECTED Final   Sapovirus (I, II, IV, and V) NOT DETECTED NOT DETECTED Final    Comment: Performed at [facility], [address] [place], [place] [address]  C Difficile Quick Screen w PCR reflex     Status: None   Collection Time: 12/02/23 11:12 AM   Specimen: STOOL  Result Value Ref Range Status   C Diff antigen NEGATIVE NEGATIVE Final   C Diff toxin  NEGATIVE NEGATIVE Final   C Diff interpretation No C. difficile detected.  Final    Comment: Performed at [facility], [address] [place], [place] [address]         Radiology Studies: DG Chest Port 1 View Result Date: 12/03/2023 EXAM: 1 VIEW(S) XRAY OF THE CHEST 12/03/2023 08:34:00 AM COMPARISON: 12/01/2023 CLINICAL HISTORY: Dyspnea FINDINGS: LUNGS AND PLEURA: Poor inspiratory effort. No focal infiltrate is seen. Left basilar atelectasis has resolved. HEART AND MEDIASTINUM: No acute abnormality of the cardiac and mediastinal silhouettes. BONES AND SOFT TISSUES: No acute osseous abnormality. IMPRESSION: 1. Resolution of prior left basilar atelectasis. Electronically signed by: Oneil Devonshire MD 12/03/2023 11:45 AM EDT RP Workstation: [id number]   MR PELVIS W WO CONTRAST Result Date: 12/02/2023 CLINICAL DATA:  Pelvic pain, nausea, and vomiting. Pelvic mass on recent CT. EXAM: MRI PELVIS WITHOUT AND WITH CONTRAST TECHNIQUE: Multiplanar multisequence MR imaging of the pelvis was performed both before and after administration of intravenous contrast. CONTRAST:  10mL GADAVIST GADOBUTROL 1 MMOL/ML IV SOLN COMPARISON:  CT on 12/01/2023 FINDINGS: Lower Urinary Tract: Bladder is displaced anteriorly by large cystic mass in central pelvis, however, no intrinsic bladder abnormality identified. Bowel: Unremarkable pelvic bowel loops. Vascular/Lymphatic: Unremarkable. No pathologically enlarged pelvic lymph nodes identified. Reproductive: -- Uterus: Measures 11.2 x 3.5 by 5.5 cm. Displaced anteriorly and to the left by large cystic mass in central pelvis. No otherwise unremarkable in appearance. -- Right ovary: No normal ovary visualized. A complex cystic lesion is seen which is centered in the right adnexa, displacing the rectum and uterus to the left. This lesion contains multiple internal cystic components with T1 hypointense fluid and peripheral enhancement, some of which appear tubular  in shape. An internal air-fluid level is also noted. There is surrounding edema and tiny amount of free fluid. This measures 22.4 by 8.0 by 9.8 cm. This is highly suspicious for a large tubo-ovarian abscess, with cystic ovarian neoplasm and endometriosis considered less likely. -- Left ovary:  No normal ovary visualized. Other: Tiny amount of free fluid seen in intraperitoneal and presacral spaces. Musculoskeletal:  Unremarkable. IMPRESSION: 22.4 cm complex  cystic mass centered in right adnexa. Features are highly suspicious for a large tubo-ovarian abscess, with cystic ovarian neoplasm and endometriosis considered less likely. Electronically Signed   By: Norleen DELENA Kil M.D.   On: 12/02/2023 16:13        Scheduled Meds:  atorvastatin  80 mg Oral Daily   chlorthalidone   25 mg Oral Daily   enoxaparin (LOVENOX) injection  60 mg Subcutaneous Q24H   insulin  aspart  0-15 Units Subcutaneous TID WC   pantoprazole   40 mg Oral Daily   Continuous Infusions:  cefTRIAXone  (ROCEPHIN )  IV Stopped (12/03/23 2120)   doxycycline  (VIBRAMYCIN ) IV 100 mg (12/04/23 0539)   metronidazole  500 mg (12/04/23 0538)     LOS: 3 days   Time spent:  Elsie JAYSON [place], DO [facility]  If 7PM-7AM, please contact night-coverage [email or web address]  12/04/2023, 12:27 PM

## 2023-12-04 NOTE — Progress Notes (Signed)
 Patient has voided x3 today. Bladder scan 395 cc. Provider updated.

## 2023-12-05 DIAGNOSIS — N7093 Salpingitis and oophoritis, unspecified: Secondary | ICD-10-CM

## 2023-12-05 DIAGNOSIS — Z6841 Body Mass Index (BMI) 40.0 and over, adult: Secondary | ICD-10-CM

## 2023-12-05 DIAGNOSIS — E119 Type 2 diabetes mellitus without complications: Secondary | ICD-10-CM

## 2023-12-05 LAB — COMPREHENSIVE METABOLIC PANEL WITH GFR
ALT: 27 U/L (ref 0–44)
AST: 36 U/L (ref 15–41)
Albumin: 2.4 g/dL — ABNORMAL LOW (ref 3.5–5.0)
Alkaline Phosphatase: 75 U/L (ref 38–126)
Anion gap: 19 — ABNORMAL HIGH (ref 5–15)
BUN: 9 mg/dL (ref 6–20)
CO2: 19 mmol/L — ABNORMAL LOW (ref 22–32)
Calcium: 8.7 mg/dL — ABNORMAL LOW (ref 8.9–10.3)
Chloride: 97 mmol/L — ABNORMAL LOW (ref 98–111)
Creatinine, Ser: 1.3 mg/dL — ABNORMAL HIGH (ref 0.44–1.00)
GFR, Estimated: 55 mL/min — ABNORMAL LOW (ref >60)
Glucose, Bld: 147 mg/dL — ABNORMAL HIGH (ref 70–99)
Potassium: 2.8 mmol/L — ABNORMAL LOW (ref 3.5–5.1)
Sodium: 135 mmol/L (ref 135–145)
Total Bilirubin: 0.6 mg/dL (ref 0.0–1.2)
Total Protein: 7 g/dL (ref 6.5–8.1)

## 2023-12-05 LAB — CBC
HCT: 31.4 % — ABNORMAL LOW (ref 36.0–46.0)
Hemoglobin: 10.6 g/dL — ABNORMAL LOW (ref 12.0–15.0)
MCH: 31 pg (ref 26.0–34.0)
MCHC: 33.8 g/dL (ref 30.0–36.0)
MCV: 91.8 fL (ref 80.0–100.0)
Platelets: 306 K/uL (ref 150–400)
RBC: 3.42 MIL/uL — ABNORMAL LOW (ref 3.87–5.11)
RDW: 13.1 % (ref 11.5–15.5)
WBC: 21.9 K/uL — ABNORMAL HIGH (ref 4.0–10.5)
nRBC: 0 % (ref 0.0–0.2)

## 2023-12-05 LAB — PROTIME-INR
INR: 1.2 (ref 0.8–1.2)
Prothrombin Time: 16.3 s — ABNORMAL HIGH (ref 11.4–15.2)

## 2023-12-05 LAB — MAGNESIUM: Magnesium: 2 mg/dL (ref 1.7–2.4)

## 2023-12-05 LAB — GLUCOSE, CAPILLARY
Glucose-Capillary: 144 mg/dL — ABNORMAL HIGH (ref 70–99)
Glucose-Capillary: 141 mg/dL — ABNORMAL HIGH (ref 70–99)
Glucose-Capillary: 123 mg/dL — ABNORMAL HIGH (ref 70–99)

## 2023-12-05 MED ORDER — HYDROMORPHONE HCL 1 MG/ML IJ SOLN
0.5000 mg | INTRAMUSCULAR | Status: DC | PRN
  Administered 2023-12-05 – 2023-12-08 (×17): 0.5 mg via INTRAVENOUS
  Filled 2023-12-05 (×17): qty 0.5

## 2023-12-05 MED ORDER — POTASSIUM CHLORIDE 10 MEQ/100ML IV SOLN
10.0000 meq | INTRAVENOUS | Status: AC
  Administered 2023-12-05 (×4): 10 meq via INTRAVENOUS
  Filled 2023-12-05 (×4): qty 100

## 2023-12-05 MED ORDER — SODIUM CHLORIDE 0.9 % IV SOLN: INTRAVENOUS | Status: AC

## 2023-12-05 NOTE — Progress Notes (Signed)
 IR has been paged and consult placed. Will await their opinion on the pelvic mass regarding drainage.   Vina Solian, MD Attending Obstetrician & Gynecologist, [facility] for [facility], [facility]

## 2023-12-05 NOTE — Progress Notes (Signed)
   IR Note   Request made for IR consideration of TOA drain placement Was transferred from [facility] to [facility] yesterday  MRI 101/14: 22.4 cm complex cystic mass centered in right adnexa. Features are highly suspicious for a large tubo-ovarian abscess, with cystic ovarian neoplasm and endometriosis considered less likely.  Had been reviewed by Dr Luverne few days ago at [facility] too deep; too complex for IR drain  Re review requested today Dr Jenna has reviewed today. Agrees too deep; too complex.  No IR drain placement recommended  Dr Cleatus aware

## 2023-12-05 NOTE — Hospital Course (Signed)
 33 year old woman PMH including diabetes, presented with abdominal pain nausea and dry heaving, p/w sepsis and huge suspected tubo-ovarian abscess.  Transferred from [facility] to [facility].   Assessment and Plan:   Concern for sepsis - Fever, leukocytosis, noted large abscess given concern for sepsis.  Blood cultures, IV fluid bolus, empiric broad-spectrum antibiotics on board.  Monitoring leukocytosis and blood pressure closely.   Suspected large tubo-ovarian abscess - MRI noting 22.4 cm complex cystic mass centered in the right adnexa suspicious for abscess.  OB/Gyn and IR following closely.  Evaluation by IR stating masses too deep and too complex for simple IR drainage.  OB/Gyn reaching out to Gen surg for impending surgery today.  Leukocytosis show only minimal improvement, WBC 18.3.  Continues on broad-spectrum empiric antibiotics with ceftriaxone , Doxy, Flagyl .   Hypokalemia - Slightly worse this morning.  Patient not tolerating p.o. so we will order another IV KCl replenishment.  IV fluids changed to half NS with 20 of K.  Will recheck BMP and magnesium  in AM.   Acute kidney injury - Creatinine showing improvement this morning.  IV fluid hydration on board.  Urine output excellent.  Foley in place.  Some suspicion of possible obstruction given size and location of abscess above.  Continue to monitor renal function closely.  Will monitor urine output and recheck kidney function in AM.   Diabetes mellitus - A1c 6.3 suggesting excellent control at home.  Insulin  sliding scale ordered.  P.o. intake poor.   Class III obesity - BMI greater than 51.  Encourage lifestyle modifications after hospitalization.

## 2023-12-05 NOTE — Consult Note (Signed)
 OBSTETRICS AND GYNECOLOGY ATTENDING CONSULT NOTE  History of Present Illness: Misty Walsh is an 33 y.o. G10P0101 female who was admitted for tubo-ovarian abscess.  She was transferred from [facility] to here for Gyn consultation.  She reports pain and fevers started 2 weeks ago. She has associated nausea/vomiting. She was started on ceftriaxone /doxy/flagyl  by hospitalist service at [facility]. She was septic upon admission, suspected due to urosepsis. She had a pansensitive ECOLI UTI diagnosed at [facility] on 10/10 and was having difficulty taking antibiotics due to nausea/vomiting.  She describes pain as very strong and rates as a 20/10 on pain scale.  Pertinent OB/GYN History: Patient's last menstrual period was 11/18/2023 (exact date). OB History  Gravida Para Term Preterm AB Living  1 1 0 1 0 1  SAB IAB Ectopic Multiple Live Births  0 0 0 0 1    # Outcome Date GA Lbr Len/2nd Weight Sex Type Anes PTL Lv  1 Preterm 12/13/12 [redacted]w[redacted]d / 00:59 760 g M CS-LTranv Spinal  LIV     Name: LYNNIE, KOEHLER     Apgar1: 4  Apgar5: 6      Patient Active Problem List   Diagnosis Date Noted   Tubo-ovarian abscess 12/02/2023   Acute pyelonephritis 12/02/2023   BMI 50.0-59.9, adult (HCC) 12/02/2023   Obesity, class 3 (HCC) 12/02/2023   Sepsis due to gram-negative UTI (HCC) 12/01/2023   Metabolic dysfunction-associated steatotic liver disease (MASLD) 12/26/2022   Class 3 severe obesity with serious comorbidity and body mass index (BMI) of 50.0 to 59.9 in adult (HCC) 11/20/2022   Physically inactive 11/20/2022   Cyst of right ovary 06/05/2022   Episodic cluster headache, not intractable 04/09/2022   Hypoventilation associated with obesity syndrome (HCC) 04/09/2022   Sleep-related headache 04/09/2022   Loud snoring 04/09/2022   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/2022   Anxiety and depression 04/09/2022   Adnexal mass 03/20/2022   Type 2 diabetes mellitus without complication,  without long-term current use of insulin  (HCC) 02/08/2021   Symptomatic mammary hypertrophy 12/07/2019   Back pain 12/07/2019   Neck pain 12/07/2019   Prediabetes 11/04/2019   Hyperlipemia 11/04/2019   Vitamin D  deficiency 11/04/2019   Primary hypertension 03/22/2019   Hemorrhagic ovarian cyst 06/21/2017   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   Cellulitis 01/06/2013   History of cesarean section, classical 12/18/2012   Allergy 10/29/2012    Past Medical History:  Diagnosis Date   Anxiety    Back pain    Chlamydia    Depression med made her navel itching and  made her sleepy so she quit taking them   Diabetes mellitus without complication (HCC)    Edema, lower extremity    Elevated cholesterol    Fibrocystic breast changes of both breasts    GERD (gastroesophageal reflux disease)    History of cesarean section, classical 12/18/2012   2014    Human papilloma virus    Hypertension    Insomnia    Joint pain    Moderate dysplasia of cervix    Numbness    right arm to hand   Ovarian cyst    Prediabetes    Tubo-ovarian abscess 12/02/2023   Vitamin D  deficiency     Past Surgical History:  Procedure Laterality Date   BREAST BIOPSY Left 2012   benign fibroadeoma   BREAST SURGERY     CESAREAN SECTION N/A 12/13/2012   Procedure: PRIMARY CESAREAN SECTION;  Surgeon: Aida DELENA Na,  MD;  Location: [facility];  Service: Obstetrics;  Laterality: N/A;   LEEP      Family History  Problem Relation Age of Onset   Diabetes Mother    Heart disease Mother    Hypertension Mother    Depression Mother    Stroke Mother    Arthritis Mother    Anxiety disorder Mother    Stroke Father    High blood pressure Father    High Cholesterol Father    Depression Father    Anxiety disorder Father    Obesity Father    Bipolar disorder Father    Asthma Sister    Kidney disease Brother        genetic condition   Hypertension Maternal Grandmother    Dementia Maternal  Grandmother    Heart disease Maternal Grandmother    Diabetes Paternal Grandmother    Diabetes Maternal Aunt    Hypertension Maternal Aunt    Asthma Maternal Aunt    Epilepsy Maternal Aunt     Social History:  reports that she quit smoking about 3 years ago. Her smoking use included cigarettes. She started smoking about 8 years ago. She has a 1.3 pack-year smoking history. She has never used smokeless tobacco. She reports that she does not currently use alcohol. She reports that she does not use drugs.  Allergies:  Allergies  Allergen Reactions   Macrobid  [Nitrofurantoin  Macrocrystal] Itching    Caused the patient to feel hot.    Medications: I have reviewed the patient's current medications.  Review of Systems: Pertinent items are noted in HPI.  Focused Physical Examination: BP (!) 150/86 (BP Location: Left Wrist)   Pulse (!) 108   Temp 99.3 F (37.4 C) (Oral)   Resp 17   Ht 5' 4 (1.626 m)   Wt 136.1 kg   LMP 11/18/2023 (Exact Date)   SpO2 96%   BMI 51.49 kg/m  CONSTITUTIONAL: Well-developed, well-nourished female in no acute distress.  NEUROLOGIC: Alert and oriented to person, place, and time. Normal reflexes, muscle tone coordination. No cranial nerve deficit noted. PSYCHIATRIC: Normal mood and affect. Normal behavior. Normal judgment and thought content. CARDIOVASCULAR: Normal heart rate noted  RESPIRATORY: nonlabored breathing ABDOMEN: Soft, mildly tender throughout, no rebound/gurading PELVIC: deferred MUSCULOSKELETAL: Normal range of motion.    Labs and Imaging: Results for orders placed or performed during the hospital encounter of 12/01/23 (from the past 72 hours)  Glucose, capillary     Status: Abnormal   Collection Time: 12/02/23  7:35 AM  Result Value Ref Range   Glucose-Capillary 130 (H) 70 - 99 mg/dL    Comment: Glucose reference range applies only to samples taken after fasting for at least 8 hours.  Gastrointestinal Panel by PCR , Stool     Status:  None   Collection Time: 12/02/23  9:00 AM   Specimen: Stool  Result Value Ref Range   Campylobacter species NOT DETECTED NOT DETECTED   Plesimonas shigelloides NOT DETECTED NOT DETECTED   Salmonella species NOT DETECTED NOT DETECTED   Yersinia enterocolitica NOT DETECTED NOT DETECTED   Vibrio species NOT DETECTED NOT DETECTED   Vibrio cholerae NOT DETECTED NOT DETECTED   Enteroaggregative E coli (EAEC) NOT DETECTED NOT DETECTED   Enteropathogenic E coli (EPEC) NOT DETECTED NOT DETECTED   Enterotoxigenic E coli (ETEC) NOT DETECTED NOT DETECTED   Shiga like toxin producing E coli (STEC) NOT DETECTED NOT DETECTED   Shigella/Enteroinvasive E coli (EIEC) NOT DETECTED NOT DETECTED   Cryptosporidium  NOT DETECTED NOT DETECTED   Cyclospora cayetanensis NOT DETECTED NOT DETECTED   Entamoeba histolytica NOT DETECTED NOT DETECTED   Giardia lamblia NOT DETECTED NOT DETECTED   Adenovirus F40/41 NOT DETECTED NOT DETECTED   Astrovirus NOT DETECTED NOT DETECTED   Norovirus GI/GII NOT DETECTED NOT DETECTED   Rotavirus A NOT DETECTED NOT DETECTED   Sapovirus (I, II, IV, and V) NOT DETECTED NOT DETECTED    Comment: Performed at [facility], [address], [place], [place] [address]  C Difficile Quick Screen w PCR reflex     Status: None   Collection Time: 12/02/23 11:12 AM   Specimen: STOOL  Result Value Ref Range   C Diff antigen NEGATIVE NEGATIVE   C Diff toxin NEGATIVE NEGATIVE   C Diff interpretation No C. difficile detected.     Comment: Performed at [facility], [address] [place], [place] [address]  Glucose, capillary     Status: Abnormal   Collection Time: 12/02/23 11:27 AM  Result Value Ref Range   Glucose-Capillary 149 (H) 70 - 99 mg/dL    Comment: Glucose reference range applies only to samples taken after fasting for at least 8 hours.  Glucose, capillary     Status: Abnormal   Collection Time: 12/02/23  4:54 PM  Result Value Ref Range    Glucose-Capillary 150 (H) 70 - 99 mg/dL    Comment: Glucose reference range applies only to samples taken after fasting for at least 8 hours.  Glucose, capillary     Status: Abnormal   Collection Time: 12/02/23  8:46 PM  Result Value Ref Range   Glucose-Capillary 138 (H) 70 - 99 mg/dL    Comment: Glucose reference range applies only to samples taken after fasting for at least 8 hours.  CBC     Status: Abnormal   Collection Time: 12/03/23  3:57 AM  Result Value Ref Range   WBC 20.2 (H) 4.0 - 10.5 K/uL   RBC 3.51 (L) 3.87 - 5.11 MIL/uL   Hemoglobin 10.4 (L) 12.0 - 15.0 g/dL   HCT 64.7 (L) 63.9 - 53.9 %   MCV 100.3 (H) 80.0 - 100.0 fL   MCH 29.6 26.0 - 34.0 pg   MCHC 29.5 (L) 30.0 - 36.0 g/dL   RDW 87.2 88.4 - 84.4 %   Platelets 238 150 - 400 K/uL   nRBC 0.0 0.0 - 0.2 %    Comment: Performed at [facility], [address], [place], [place] [address]  Basic metabolic panel     Status: Abnormal   Collection Time: 12/03/23  3:57 AM  Result Value Ref Range   Sodium 136 135 - 145 mmol/L   Potassium 3.7 3.5 - 5.1 mmol/L   Chloride 101 98 - 111 mmol/L   CO2 20 (L) 22 - 32 mmol/L   Glucose, Bld 146 (H) 70 - 99 mg/dL    Comment: Glucose reference range applies only to samples taken after fasting for at least 8 hours.   BUN 7 6 - 20 mg/dL   Creatinine, Ser 9.03 0.44 - 1.00 mg/dL   Calcium  9.0 8.9 - 10.3 mg/dL   GFR, Estimated >39 >39 mL/min    Comment: (NOTE) Calculated using the CKD-EPI Creatinine Equation (2021)    Anion gap 15 5 - 15    Comment: Performed at [facility], [address], [place], [place] [address]  Glucose, capillary     Status: Abnormal   Collection Time:  12/03/23  7:17 AM  Result Value Ref Range   Glucose-Capillary 121 (H) 70 - 99 mg/dL    Comment: Glucose reference range applies only to samples taken after fasting for at least 8 hours.  Glucose, capillary     Status: Abnormal   Collection Time: 12/03/23 11:50 AM  Result  Value Ref Range   Glucose-Capillary 133 (H) 70 - 99 mg/dL    Comment: Glucose reference range applies only to samples taken after fasting for at least 8 hours.  Glucose, capillary     Status: Abnormal   Collection Time: 12/03/23  4:35 PM  Result Value Ref Range   Glucose-Capillary 128 (H) 70 - 99 mg/dL    Comment: Glucose reference range applies only to samples taken after fasting for at least 8 hours.  Glucose, capillary     Status: Abnormal   Collection Time: 12/03/23  8:30 PM  Result Value Ref Range   Glucose-Capillary 129 (H) 70 - 99 mg/dL    Comment: Glucose reference range applies only to samples taken after fasting for at least 8 hours.  CBC     Status: Abnormal   Collection Time: 12/04/23  3:51 AM  Result Value Ref Range   WBC 21.1 (H) 4.0 - 10.5 K/uL   RBC 3.26 (L) 3.87 - 5.11 MIL/uL   Hemoglobin 10.0 (L) 12.0 - 15.0 g/dL   HCT 68.7 (L) 63.9 - 53.9 %   MCV 95.7 80.0 - 100.0 fL   MCH 30.7 26.0 - 34.0 pg   MCHC 32.1 30.0 - 36.0 g/dL   RDW 87.1 88.4 - 84.4 %   Platelets 261 150 - 400 K/uL   nRBC 0.0 0.0 - 0.2 %    Comment: Performed at [facility], [address], [place], [place] [address]  Basic metabolic panel with GFR     Status: Abnormal   Collection Time: 12/04/23  3:51 AM  Result Value Ref Range   Sodium 138 135 - 145 mmol/L   Potassium 3.2 (L) 3.5 - 5.1 mmol/L   Chloride 100 98 - 111 mmol/L   CO2 23 22 - 32 mmol/L   Glucose, Bld 136 (H) 70 - 99 mg/dL    Comment: Glucose reference range applies only to samples taken after fasting for at least 8 hours.   BUN 7 6 - 20 mg/dL   Creatinine, Ser 8.99 0.44 - 1.00 mg/dL   Calcium  9.4 8.9 - 10.3 mg/dL   GFR, Estimated >39 >39 mL/min    Comment: (NOTE) Calculated using the CKD-EPI Creatinine Equation (2021)    Anion gap 15 5 - 15    Comment: Performed at [facility], [address], [place], [place] [address]  Glucose, capillary     Status: Abnormal   Collection Time: 12/04/23   7:14 AM  Result Value Ref Range   Glucose-Capillary 124 (H) 70 - 99 mg/dL    Comment: Glucose reference range applies only to samples taken after fasting for at least 8 hours.  Glucose, capillary     Status: Abnormal   Collection Time: 12/04/23 11:07 AM  Result Value Ref Range   Glucose-Capillary 139 (H) 70 - 99 mg/dL    Comment: Glucose reference range applies only to samples taken after fasting for at least 8 hours.  Glucose, capillary     Status: Abnormal   Collection Time: 12/04/23  4:03 PM  Result Value Ref Range   Glucose-Capillary 126 (H) 70 - 99 mg/dL  Comment: Glucose reference range applies only to samples taken after fasting for at least 8 hours.    DG Chest Port 1 View Result Date: 12/03/2023 EXAM: 1 VIEW(S) XRAY OF THE CHEST 12/03/2023 08:34:00 AM COMPARISON: 12/01/2023 CLINICAL HISTORY: Dyspnea FINDINGS: LUNGS AND PLEURA: Poor inspiratory effort. No focal infiltrate is seen. Left basilar atelectasis has resolved. HEART AND MEDIASTINUM: No acute abnormality of the cardiac and mediastinal silhouettes. BONES AND SOFT TISSUES: No acute osseous abnormality. IMPRESSION: 1. Resolution of prior left basilar atelectasis. Electronically signed by: Oneil Devonshire MD 12/03/2023 11:45 AM EDT RP Workstation: [id number]   MRI 12/02/23: IMPRESSION: 22.4 cm complex cystic mass centered in right adnexa. Features are highly suspicious for a large tubo-ovarian abscess, with cystic ovarian neoplasm and endometriosis considered less likely.   CT A/P 12/01/23: IMPRESSION: 1. New low-density cystic lesion measuring 7.7 cm, superior to the uterus and bladder, with unclear origin; may arise from the complex right paraovarian cyst or the left ovary. 2. Complex right ovarian cystic lesion has increased from the prior CT and measures 6.1 cm. 3. Right hydrosalpinx has increased compared to prior CT. 4. Recommend gynecologic consultation and/or MRI w/ IV contrast for further characterization  .  Assessment/Plan: Tubo-ovarian abscess: significantly enlarged TOA. Patient currently on ceftriaxone /flagyl /dox. Last fever was 10/16 at 0116. Recommend continue current antibiotics. Will re-consult IR this morning to discuss reconsideration of drainage given large size. An abscess of this size would be best managed with minimally invasive drainage with IR. Otherwise, may have to consider surgical management if not responding to medical therapy.  -- continue antibiotics -- discuss with IR -- patient NPO   Rollo ONEIDA Bring, MD, FACOG Obstetrician & Gynecologist, [facility] for [facility], [facility]

## 2023-12-05 NOTE — Progress Notes (Signed)
 Surgery has been contacted to discuss availability on Monday for possible intra-operative assistance for this patient who appears to have a large cystic mass in her pelvic, felt to be a TOA.  We will discuss on Monday with the surgeon that covers the service for his availability.  Burnard FORBES Banter

## 2023-12-05 NOTE — Progress Notes (Signed)
 Progress Note   Patient: Misty Walsh FMW:[id number] DOB: 05-16-1990 DOA: 12/01/2023  DOS: the patient was seen and examined on 12/05/2023   Brief hospital course:  33 year old woman PMH including diabetes, presented with abdominal pain nausea and dry heaving, p/w sepsis and huge suspected tubo-ovarian abscess.  Transferred from [facility] to [facility].  Assessment and Plan:  Concern for sepsis - Fever, leukocytosis, noted large abscess given concern for sepsis.  Blood cultures, IV fluid bolus, empiric broad-spectrum antibiotics on board.  Monitoring leukocytosis and blood pressure closely.  Suspected large tubo-ovarian abscess - MRI noting 22.4 cm complex cystic mass centered in the right adnexa suspicious for abscess.  OB/Gyn and IR following closely.  Evaluation by IR stating masses too deep and too complex for simple IR drainage.  It is likely that patient will need to undergo surgical evaluation.  Hypokalemia - Potassium 2.8 this morning.  IV fluid hydration on board.  Replenishment initiated.  Will recheck BMP and magnesium  in AM.  Acute kidney injury - Creatinine 1.3 this morning.  IV fluid hydration on board.  Will monitor urine output and recheck kidney function in AM.  Diabetes mellitus - A1c 6.3 suggesting excellent control at home.  Insulin  sliding scale ordered.  Class III obesity - BMI greater than 51.  Encourage lifestyle modifications after hospitalization.   Subjective: Patient resting this morning, in mild distress.  Noted diffuse abdominal pain, nausea.  Pain medications appear to be helping for now.  Reports a fever overnight.  Denies any shortness of breath, chest pain.  Physical Exam:  Vitals:   12/05/23 0141 12/05/23 0435 12/05/23 0820 12/05/23 1206  BP:  (!) 146/102 138/83 (!) 161/86  Pulse:  (!) 115 100 (!) 104  Resp:  19 16 17   Temp:  99.4 F (37.4 C) 98.8 F (37.1 C) 98 F (36.7 C)  TempSrc:  Oral Oral Oral  SpO2: 94% 94% 94% 96%  Weight:       Height:        GENERAL:  Alert but lethargic, obese, pleasant, mild acute distress  HEENT:  EOMI CARDIOVASCULAR:  RRR, no murmurs appreciated RESPIRATORY:  Clear to auscultation, no wheezing, rales, or rhonchi GASTROINTESTINAL:  Soft, diffusely mildly tender EXTREMITIES:  No LE edema bilaterally NEURO:  No new focal deficits appreciated SKIN:  No rashes noted PSYCH:  Appropriate mood and affect, anxious    Data Reviewed:  Imaging Studies: DG Chest Port 1 View Result Date: 12/03/2023 EXAM: 1 VIEW(S) XRAY OF THE CHEST 12/03/2023 08:34:00 AM COMPARISON: 12/01/2023 CLINICAL HISTORY: Dyspnea FINDINGS: LUNGS AND PLEURA: Poor inspiratory effort. No focal infiltrate is seen. Left basilar atelectasis has resolved. HEART AND MEDIASTINUM: No acute abnormality of the cardiac and mediastinal silhouettes. BONES AND SOFT TISSUES: No acute osseous abnormality. IMPRESSION: 1. Resolution of prior left basilar atelectasis. Electronically signed by: Oneil Devonshire MD 12/03/2023 11:45 AM EDT RP Workstation: [id number]   MR PELVIS W WO CONTRAST Result Date: 12/02/2023 CLINICAL DATA:  Pelvic pain, nausea, and vomiting. Pelvic mass on recent CT. EXAM: MRI PELVIS WITHOUT AND WITH CONTRAST TECHNIQUE: Multiplanar multisequence MR imaging of the pelvis was performed both before and after administration of intravenous contrast. CONTRAST:  10mL GADAVIST GADOBUTROL 1 MMOL/ML IV SOLN COMPARISON:  CT on 12/01/2023 FINDINGS: Lower Urinary Tract: Bladder is displaced anteriorly by large cystic mass in central pelvis, however, no intrinsic bladder abnormality identified. Bowel: Unremarkable pelvic bowel loops. Vascular/Lymphatic: Unremarkable. No pathologically enlarged pelvic lymph nodes identified. Reproductive: -- Uterus: Measures 11.2 x 3.5  by 5.5 cm. Displaced anteriorly and to the left by large cystic mass in central pelvis. No otherwise unremarkable in appearance. -- Right ovary: No normal ovary visualized. A complex  cystic lesion is seen which is centered in the right adnexa, displacing the rectum and uterus to the left. This lesion contains multiple internal cystic components with T1 hypointense fluid and peripheral enhancement, some of which appear tubular in shape. An internal air-fluid level is also noted. There is surrounding edema and tiny amount of free fluid. This measures 22.4 by 8.0 by 9.8 cm. This is highly suspicious for a large tubo-ovarian abscess, with cystic ovarian neoplasm and endometriosis considered less likely. -- Left ovary:  No normal ovary visualized. Other: Tiny amount of free fluid seen in intraperitoneal and presacral spaces. Musculoskeletal:  Unremarkable. IMPRESSION: 22.4 cm complex cystic mass centered in right adnexa. Features are highly suspicious for a large tubo-ovarian abscess, with cystic ovarian neoplasm and endometriosis considered less likely. Electronically Signed   By: Norleen DELENA Kil M.D.   On: 12/02/2023 16:13   CT ABDOMEN PELVIS W CONTRAST Result Date: 12/01/2023 EXAM: CT ABDOMEN AND PELVIS WITH CONTRAST 12/01/2023 04:32:27 PM TECHNIQUE: CT of the abdomen and pelvis was performed with the administration of 100 mL of iohexol  (OMNIPAQUE ) 300 MG/ML solution. Multiplanar reformatted images are provided for review. Automated exposure control, iterative reconstruction, and/or weight-based adjustment of the mA/kV was utilized to reduce the radiation dose to as low as reasonably achievable. COMPARISON: Comparison with 02/13/2022 and ultrasound 08/14/2022. CLINICAL HISTORY: N/V. Eval for pyelonephritis vs nephrolithiasis. Triage Notes: Patient BIB [facility] from home. Abdominal pain for 5 days, nausea and vomiting. 3 days ago prescribed antibiotic for UTI but trouble keeping the medication down. Said her urine culture came back positive today. Had covid 1 week ago. FINDINGS: LOWER CHEST: No acute abnormality. LIVER: Hepatic steatosis. GALLBLADDER AND BILE DUCTS: Gallbladder is unremarkable. No  biliary ductal dilatation. SPLEEN: No acute abnormality. PANCREAS: No acute abnormality. ADRENAL GLANDS: No acute abnormality. KIDNEYS, URETERS AND BLADDER: No stones in the kidneys or ureters. No hydronephrosis. No perinephric or periureteral stranding. Urinary bladder is unremarkable. GI AND BOWEL: Stomach demonstrates no acute abnormality. There is no bowel obstruction. Normal appendix. PERITONEUM AND RETROPERITONEUM: No ascites. No free air. VASCULATURE: Aorta is normal in caliber. LYMPH NODES: No lymphadenopathy. REPRODUCTIVE ORGANS: Right hydrosalpinx has increased compared to 02/13/2022. Comparison with ultrasound 08/14/2022 is limited due to differences in technique. Complex right ovarian cystic lesion has increased from the prior CT and measures 6.1 x 5.2 cm. There is a new low-density cystic lesion measuring 7.2 x 5.3 x 7.7 cm. The lesion is superior to the uterus and bladder. The origin is unclear. This may arise from the complex right paraovarian cyst or the left ovary. BONES AND SOFT TISSUES: No acute osseous abnormality. No focal soft tissue abnormality. IMPRESSION: 1. New low-density cystic lesion measuring 7.7 cm, superior to the uterus and bladder, with unclear origin; may arise from the complex right paraovarian cyst or the left ovary. 2. Complex right ovarian cystic lesion has increased from the prior CT and measures 6.1 cm. 3. Right hydrosalpinx has increased compared to prior CT. 4. Recommend gynecologic consultation and/or MRI w/ IV contrast for further characterization . Electronically signed by: Norman Gatlin MD 12/01/2023 05:20 PM EDT RP Workstation: [id number]   DG Chest Portable 1 View Result Date: 12/01/2023 CLINICAL DATA:  Shortness of breath, sepsis EXAM: PORTABLE CHEST 1 VIEW COMPARISON:  09/19/2016 FINDINGS: Low lung volumes are present, causing crowding  of the pulmonary vasculature. Bandlike opacity just above the left hemidiaphragm probably representing atelectasis in the left  lower lobe. Hazy density along the right hemithorax likely mostly attributable to soft tissues of the chest wall and the underlying low lung volumes, no discrete airspace opacity identified on the right. No blunting the costophrenic angles. No significant bony findings. IMPRESSION: 1. Low lung volumes are present, causing crowding of the pulmonary vasculature. 2. Bandlike opacity just above the left hemidiaphragm probably representing atelectasis in the left lower lobe. Electronically Signed   By: Ryan Salvage M.D.   On: 12/01/2023 13:44    There are no new results to review at this time.  Previous records (including but not limited to H&P, progress notes, nursing notes, TOC management) were reviewed in assessment of this patient.  Labs: CBC: Recent Labs  Lab 12/01/23 1312 12/03/23 0357 12/04/23 0351 12/05/23 0730  WBC 21.7* 20.2* 21.1* 21.9*  NEUTROABS 19.2*  --   --   --   HGB 12.7 10.4* 10.0* 10.6*  HCT 39.5 35.2* 31.2* 31.4*  MCV 93.8 100.3* 95.7 91.8  PLT 262 238 261 306   Basic Metabolic Panel: Recent Labs  Lab 12/01/23 1312 12/02/23 0003 12/03/23 0357 12/04/23 0351 12/05/23 0730  NA 131*  --  136 138 135  K 3.2*  --  3.7 3.2* 2.8*  CL 93*  --  101 100 97*  CO2 22  --  20* 23 19*  GLUCOSE 201*  --  146* 136* 147*  BUN 9  --  7 7 9   CREATININE 1.17*  --  0.96 1.00 1.30*  CALCIUM  9.7  --  9.0 9.4 8.7*  MG  --  2.2  --   --  2.0   Liver Function Tests: Recent Labs  Lab 12/01/23 1312 12/05/23 0730  AST 26 36  ALT 29 27  ALKPHOS 93 75  BILITOT 0.8 0.6  PROT 8.1 7.0  ALBUMIN 3.8 2.4*   CBG: Recent Labs  Lab 12/04/23 0714 12/04/23 1107 12/04/23 1603 12/05/23 0821 12/05/23 1214  GLUCAP 124* 139* 126* 144* 141*    Scheduled Meds:  atorvastatin  80 mg Oral Daily   chlorthalidone   25 mg Oral Daily   enoxaparin (LOVENOX) injection  60 mg Subcutaneous Q24H   insulin  aspart  0-15 Units Subcutaneous TID WC   pantoprazole   40 mg Oral Daily    Continuous Infusions:  sodium chloride  100 mL/hr at 12/05/23 0911   cefTRIAXone  (ROCEPHIN )  IV 2 g (12/04/23 2108)   doxycycline  (VIBRAMYCIN ) IV 100 mg (12/05/23 0617)   metronidazole  500 mg (12/05/23 0612)   PRN Meds:.alum & mag hydroxide-simeth, hydrALAZINE, HYDROmorphone  (DILAUDID ) injection, ondansetron  (ZOFRAN ) IV, mouth rinse, oxyCODONE , prochlorperazine   Family Communication: None at bedside  Disposition: Status is: Inpatient Remains inpatient appropriate because: Sepsis, tubo-ovarian abscess     Time spent: 50 minutes  Length of inpatient stay: 4 days  Author: Carliss LELON Canales, DO 12/05/2023 12:30 PM  For on call review [email or web address].

## 2023-12-05 NOTE — Plan of Care (Signed)

## 2023-12-05 NOTE — Progress Notes (Signed)
 I spoke with Misty Walsh regarding the ovarian mass, suspected to be TOA. I spoke with IR who feels like most likely etiology is TOA as well based on her imaging. They feel that the cyst is too complex, too loculated and too deep to drain.   I reviewed with Misty Walsh, our option at this point would be IV antibiotics versus surgery. Unfortunately, given the size of the mass, I do not think IV antibiotics alone will be sufficient and recommend surgery.   She reports she is done with child-bearing and would be fine if she needs to have a hysterectomy.   We discussed that ideally we will perform a TAH/USO but that this will depend on what we find when we enter her abdomen in terms of inflammation and scar tissue. We reviewed that we will continue the antibiotics during this time in hopes to cool down some of the inflammation. We also reviewed we are currently planning the surgery for Monday in order to have everything arranged and in order I.e. NPO, no recent blood thinners (Lovenox today at 10am). I adjusted her orders for this purpose for Monday.   Dr. Arlon will work on optimizing her as best as possible prior to her surgery. General surgery is aware of the patient and have reviewed her imaging. We will consult them intra-op in the event there are bowel adhesions.   Misty Walsh and I discussed the risks and benefits of surgery.  - Risks of surgery include but are not limited to:  Bleeding - Can bleed enough to need transfusion or need for additional surgeries I.e. conversation to open surgery. She reports she would accept a transfusion if needed.  Infection - The vagina can develop cuff infection or the incision can develop an infection especially if a TOA is present.  Injury to surrounding organs/tissues (i.e. bowel/bladder/ureters) - discussed how each of these is managed I.e. bladder injury requires catheter for 10-14 days after surgery, general surgery would repair bowel injury as indicated.   Need for additional procedures - Would be specific to a complication or injury Wound complications - I.e. would separation or infection.  Hospital re-admission - In the event of a delayed complication being recognized I.e. ureteral injury discussed VTE - Discussed risk of blood clots following delivery - We discussed it is possible that the scar tissue is so dense that we are not able to do a complete hysterectomy. We would plan to leave one ovary in, but it is also possible that scar tissue may make it impossible to see the other ovary to be able to preserve it. We discussed if both ovaries were removed and unavoidably so this would put her into menopause.  - All questions answered at this time, but we will continue to discuss over the next couple days any additional questions.   Vina Solian, MD Attending Obstetrician & Gynecologist, [facility] for [facility], [facility]

## 2023-12-06 DIAGNOSIS — E66813 Obesity, class 3: Secondary | ICD-10-CM

## 2023-12-06 DIAGNOSIS — N10 Acute tubulo-interstitial nephritis: Secondary | ICD-10-CM

## 2023-12-06 LAB — GLUCOSE, CAPILLARY
Glucose-Capillary: 165 mg/dL — ABNORMAL HIGH (ref 70–99)
Glucose-Capillary: 152 mg/dL — ABNORMAL HIGH (ref 70–99)
Glucose-Capillary: 172 mg/dL — ABNORMAL HIGH (ref 70–99)

## 2023-12-06 LAB — COMPREHENSIVE METABOLIC PANEL WITH GFR
ALT: 28 U/L (ref 0–44)
AST: 46 U/L — ABNORMAL HIGH (ref 15–41)
Albumin: 2.4 g/dL — ABNORMAL LOW (ref 3.5–5.0)
Alkaline Phosphatase: 84 U/L (ref 38–126)
Anion gap: 16 — ABNORMAL HIGH (ref 5–15)
BUN: 9 mg/dL (ref 6–20)
CO2: 23 mmol/L (ref 22–32)
Calcium: 8.7 mg/dL — ABNORMAL LOW (ref 8.9–10.3)
Chloride: 98 mmol/L (ref 98–111)
Creatinine, Ser: 1.3 mg/dL — ABNORMAL HIGH (ref 0.44–1.00)
GFR, Estimated: 55 mL/min — ABNORMAL LOW (ref >60)
Glucose, Bld: 152 mg/dL — ABNORMAL HIGH (ref 70–99)
Potassium: 3 mmol/L — ABNORMAL LOW (ref 3.5–5.1)
Sodium: 137 mmol/L (ref 135–145)
Total Bilirubin: 0.7 mg/dL (ref 0.0–1.2)
Total Protein: 7.1 g/dL (ref 6.5–8.1)

## 2023-12-06 LAB — CULTURE, BLOOD (ROUTINE X 2)
Culture: NO GROWTH
Culture: NO GROWTH
Special Requests: ADEQUATE
Special Requests: ADEQUATE

## 2023-12-06 LAB — CBC
HCT: 31.9 % — ABNORMAL LOW (ref 36.0–46.0)
Hemoglobin: 10.4 g/dL — ABNORMAL LOW (ref 12.0–15.0)
MCH: 30.4 pg (ref 26.0–34.0)
MCHC: 32.6 g/dL (ref 30.0–36.0)
MCV: 93.3 fL (ref 80.0–100.0)
Platelets: 343 K/uL (ref 150–400)
RBC: 3.42 MIL/uL — ABNORMAL LOW (ref 3.87–5.11)
RDW: 13.3 % (ref 11.5–15.5)
WBC: 20.6 K/uL — ABNORMAL HIGH (ref 4.0–10.5)
nRBC: 0 % (ref 0.0–0.2)

## 2023-12-06 LAB — MAGNESIUM: Magnesium: 2.3 mg/dL (ref 1.7–2.4)

## 2023-12-06 MED ORDER — SODIUM CHLORIDE 0.9 % IV SOLN
2.0000 g | INTRAVENOUS | Status: AC
  Administered 2023-12-06 – 2023-12-10 (×5): 2 g via INTRAVENOUS
  Filled 2023-12-06 (×5): qty 20

## 2023-12-06 MED ORDER — ZOLPIDEM TARTRATE 5 MG PO TABS
5.0000 mg | ORAL_TABLET | Freq: Every evening | ORAL | Status: DC | PRN
  Administered 2023-12-06 – 2023-12-14 (×2): 5 mg via ORAL
  Filled 2023-12-06 (×2): qty 1

## 2023-12-06 MED ORDER — POTASSIUM CHLORIDE 10 MEQ/100ML IV SOLN
10.0000 meq | INTRAVENOUS | Status: AC
  Administered 2023-12-06 (×3): 10 meq via INTRAVENOUS
  Filled 2023-12-06 (×3): qty 100

## 2023-12-06 MED ORDER — SODIUM CHLORIDE 0.9 % IV SOLN: INTRAVENOUS | Status: DC

## 2023-12-06 MED ORDER — IBUPROFEN 800 MG PO TABS
400.0000 mg | ORAL_TABLET | ORAL | Status: AC
  Administered 2023-12-06: 400 mg via ORAL
  Filled 2023-12-06: qty 1

## 2023-12-06 MED ORDER — LOPERAMIDE HCL 2 MG PO CAPS
2.0000 mg | ORAL_CAPSULE | ORAL | Status: AC | PRN
  Administered 2023-12-07 (×2): 2 mg via ORAL
  Filled 2023-12-06 (×2): qty 1

## 2023-12-06 MED ORDER — POTASSIUM CHLORIDE 20 MEQ PO PACK: 40.0000 meq | PACK | Freq: Two times a day (BID) | ORAL | Status: DC

## 2023-12-06 MED ADMIN — Guaifenesin Liquid 100 MG/5ML: 5 mL | ORAL | NDC 00121223215

## 2023-12-06 MED FILL — Guaifenesin Liquid 100 MG/5ML: 5.0000 mL | ORAL | Qty: 15 | Status: AC

## 2023-12-06 NOTE — Plan of Care (Signed)

## 2023-12-06 NOTE — Progress Notes (Signed)
 Progress Note   Patient: Misty Walsh FMW:[id number] DOB: 07-12-1990 DOA: 12/01/2023  DOS: the patient was seen and examined on 12/06/2023   Brief hospital course:  33 year old woman PMH including diabetes, presented with abdominal pain nausea and dry heaving, p/w sepsis and huge suspected tubo-ovarian abscess.  Transferred from [facility] to [facility].   Assessment and Plan:   Concern for sepsis - Fever, leukocytosis, noted large abscess given concern for sepsis.  Blood cultures, IV fluid bolus, empiric broad-spectrum antibiotics on board.  Monitoring leukocytosis and blood pressure closely.   Suspected large tubo-ovarian abscess - MRI noting 22.4 cm complex cystic mass centered in the right adnexa suspicious for abscess.  OB/Gyn and IR following closely.  Evaluation by IR stating masses too deep and too complex for simple IR drainage.  OB/Gyn reaching out to Gen surg for impending surgery Monday.  Leukocytosis show only minimal improvement, still greater than 20.  Continues on broad-spectrum empiric antibiotics with ceftriaxone , Doxy, Flagyl .  Hypokalemia - Improved from 2.8 to 3.0 this morning.  Patient not tolerating p.o. so we will order IV KCl replenishment.  IV fluid hydration on board.  Will recheck BMP and magnesium  in AM.   Acute kidney injury - Creatinine 1.3 again this morning.  IV fluid hydration on board.  Urine output unclear.  Will order Foley catheter to be placed.  Some suspicion of possible obstruction given size and location of abscess above.  Continue to monitor renal function closely.  Will monitor urine output and recheck kidney function in AM.   Diabetes mellitus - A1c 6.3 suggesting excellent control at home.  Insulin  sliding scale ordered.  P.o. intake poor.   Class III obesity - BMI greater than 51.  Encourage lifestyle modifications after hospitalization.   Subjective: Patient resting this morning, family at bedside.  States her abdominal pain is still  present but somewhat better controlled than yesterday.  Still having nausea with very poor p.o. intake.  Denies any fevers, shortness of breath, chest pain, vomiting.  Was able to answer family questions about her diagnosis and upcoming plan/disposition.  Physical Exam:  Vitals:   12/06/23 0522 12/06/23 0817 12/06/23 0830 12/06/23 1158  BP: (!) 146/88 (!) 154/64  (!) 140/86  Pulse: (!) 102 (!) 105  98  Resp: 17 17  16   Temp: 98.5 F (36.9 C)  98.4 F (36.9 C) 98.4 F (36.9 C)  TempSrc: Oral  Oral Oral  SpO2: 95% 95%  95%  Weight:      Height:        GENERAL:  Alert but lethargic, obese, pleasant, mild acute distress  HEENT:  EOMI CARDIOVASCULAR:  RRR, no murmurs appreciated RESPIRATORY:  Clear to auscultation, no wheezing, rales, or rhonchi GASTROINTESTINAL:  Soft, diffusely mildly tender EXTREMITIES:  No LE edema bilaterally NEURO:  No new focal deficits appreciated SKIN:  No rashes noted PSYCH:  Appropriate mood and affect, anxious   Data Reviewed:  Imaging Studies: DG Chest Port 1 View Result Date: 12/03/2023 EXAM: 1 VIEW(S) XRAY OF THE CHEST 12/03/2023 08:34:00 AM COMPARISON: 12/01/2023 CLINICAL HISTORY: Dyspnea FINDINGS: LUNGS AND PLEURA: Poor inspiratory effort. No focal infiltrate is seen. Left basilar atelectasis has resolved. HEART AND MEDIASTINUM: No acute abnormality of the cardiac and mediastinal silhouettes. BONES AND SOFT TISSUES: No acute osseous abnormality. IMPRESSION: 1. Resolution of prior left basilar atelectasis. Electronically signed by: Oneil Devonshire MD 12/03/2023 11:45 AM EDT RP Workstation: [id number]   MR PELVIS W WO CONTRAST Result Date: 12/02/2023 CLINICAL DATA:  Pelvic pain, nausea, and vomiting. Pelvic mass on recent CT. EXAM: MRI PELVIS WITHOUT AND WITH CONTRAST TECHNIQUE: Multiplanar multisequence MR imaging of the pelvis was performed both before and after administration of intravenous contrast. CONTRAST:  10mL GADAVIST GADOBUTROL 1 MMOL/ML IV  SOLN COMPARISON:  CT on 12/01/2023 FINDINGS: Lower Urinary Tract: Bladder is displaced anteriorly by large cystic mass in central pelvis, however, no intrinsic bladder abnormality identified. Bowel: Unremarkable pelvic bowel loops. Vascular/Lymphatic: Unremarkable. No pathologically enlarged pelvic lymph nodes identified. Reproductive: -- Uterus: Measures 11.2 x 3.5 by 5.5 cm. Displaced anteriorly and to the left by large cystic mass in central pelvis. No otherwise unremarkable in appearance. -- Right ovary: No normal ovary visualized. A complex cystic lesion is seen which is centered in the right adnexa, displacing the rectum and uterus to the left. This lesion contains multiple internal cystic components with T1 hypointense fluid and peripheral enhancement, some of which appear tubular in shape. An internal air-fluid level is also noted. There is surrounding edema and tiny amount of free fluid. This measures 22.4 by 8.0 by 9.8 cm. This is highly suspicious for a large tubo-ovarian abscess, with cystic ovarian neoplasm and endometriosis considered less likely. -- Left ovary:  No normal ovary visualized. Other: Tiny amount of free fluid seen in intraperitoneal and presacral spaces. Musculoskeletal:  Unremarkable. IMPRESSION: 22.4 cm complex cystic mass centered in right adnexa. Features are highly suspicious for a large tubo-ovarian abscess, with cystic ovarian neoplasm and endometriosis considered less likely. Electronically Signed   By: Norleen DELENA Kil M.D.   On: 12/02/2023 16:13   CT ABDOMEN PELVIS W CONTRAST Result Date: 12/01/2023 EXAM: CT ABDOMEN AND PELVIS WITH CONTRAST 12/01/2023 04:32:27 PM TECHNIQUE: CT of the abdomen and pelvis was performed with the administration of 100 mL of iohexol  (OMNIPAQUE ) 300 MG/ML solution. Multiplanar reformatted images are provided for review. Automated exposure control, iterative reconstruction, and/or weight-based adjustment of the mA/kV was utilized to reduce the radiation  dose to as low as reasonably achievable. COMPARISON: Comparison with 02/13/2022 and ultrasound 08/14/2022. CLINICAL HISTORY: N/V. Eval for pyelonephritis vs nephrolithiasis. Triage Notes: Patient BIB [facility] from home. Abdominal pain for 5 days, nausea and vomiting. 3 days ago prescribed antibiotic for UTI but trouble keeping the medication down. Said her urine culture came back positive today. Had covid 1 week ago. FINDINGS: LOWER CHEST: No acute abnormality. LIVER: Hepatic steatosis. GALLBLADDER AND BILE DUCTS: Gallbladder is unremarkable. No biliary ductal dilatation. SPLEEN: No acute abnormality. PANCREAS: No acute abnormality. ADRENAL GLANDS: No acute abnormality. KIDNEYS, URETERS AND BLADDER: No stones in the kidneys or ureters. No hydronephrosis. No perinephric or periureteral stranding. Urinary bladder is unremarkable. GI AND BOWEL: Stomach demonstrates no acute abnormality. There is no bowel obstruction. Normal appendix. PERITONEUM AND RETROPERITONEUM: No ascites. No free air. VASCULATURE: Aorta is normal in caliber. LYMPH NODES: No lymphadenopathy. REPRODUCTIVE ORGANS: Right hydrosalpinx has increased compared to 02/13/2022. Comparison with ultrasound 08/14/2022 is limited due to differences in technique. Complex right ovarian cystic lesion has increased from the prior CT and measures 6.1 x 5.2 cm. There is a new low-density cystic lesion measuring 7.2 x 5.3 x 7.7 cm. The lesion is superior to the uterus and bladder. The origin is unclear. This may arise from the complex right paraovarian cyst or the left ovary. BONES AND SOFT TISSUES: No acute osseous abnormality. No focal soft tissue abnormality. IMPRESSION: 1. New low-density cystic lesion measuring 7.7 cm, superior to the uterus and bladder, with unclear origin; may arise from the complex  right paraovarian cyst or the left ovary. 2. Complex right ovarian cystic lesion has increased from the prior CT and measures 6.1 cm. 3. Right hydrosalpinx has  increased compared to prior CT. 4. Recommend gynecologic consultation and/or MRI w/ IV contrast for further characterization . Electronically signed by: Norman Gatlin MD 12/01/2023 05:20 PM EDT RP Workstation: [id number]   DG Chest Portable 1 View Result Date: 12/01/2023 CLINICAL DATA:  Shortness of breath, sepsis EXAM: PORTABLE CHEST 1 VIEW COMPARISON:  09/19/2016 FINDINGS: Low lung volumes are present, causing crowding of the pulmonary vasculature. Bandlike opacity just above the left hemidiaphragm probably representing atelectasis in the left lower lobe. Hazy density along the right hemithorax likely mostly attributable to soft tissues of the chest wall and the underlying low lung volumes, no discrete airspace opacity identified on the right. No blunting the costophrenic angles. No significant bony findings. IMPRESSION: 1. Low lung volumes are present, causing crowding of the pulmonary vasculature. 2. Bandlike opacity just above the left hemidiaphragm probably representing atelectasis in the left lower lobe. Electronically Signed   By: Ryan Salvage M.D.   On: 12/01/2023 13:44    There are no new results to review at this time.  Previous records (including but not limited to H&P, progress notes, nursing notes, TOC management) were reviewed in assessment of this patient.  Labs: CBC: Recent Labs  Lab 12/01/23 1312 12/03/23 0357 12/04/23 0351 12/05/23 0730 12/06/23 0418  WBC 21.7* 20.2* 21.1* 21.9* 20.6*  NEUTROABS 19.2*  --   --   --   --   HGB 12.7 10.4* 10.0* 10.6* 10.4*  HCT 39.5 35.2* 31.2* 31.4* 31.9*  MCV 93.8 100.3* 95.7 91.8 93.3  PLT 262 238 261 306 343   Basic Metabolic Panel: Recent Labs  Lab 12/01/23 1312 12/02/23 0003 12/03/23 0357 12/04/23 0351 12/05/23 0730 12/06/23 0418  NA 131*  --  136 138 135 137  K 3.2*  --  3.7 3.2* 2.8* 3.0*  CL 93*  --  101 100 97* 98  CO2 22  --  20* 23 19* 23  GLUCOSE 201*  --  146* 136* 147* 152*  BUN 9  --  7 7 9 9   CREATININE  1.17*  --  0.96 1.00 1.30* 1.30*  CALCIUM  9.7  --  9.0 9.4 8.7* 8.7*  MG  --  2.2  --   --  2.0 2.3   Liver Function Tests: Recent Labs  Lab 12/01/23 1312 12/05/23 0730 12/06/23 0418  AST 26 36 46*  ALT 29 27 28   ALKPHOS 93 75 84  BILITOT 0.8 0.6 0.7  PROT 8.1 7.0 7.1  ALBUMIN 3.8 2.4* 2.4*   CBG: Recent Labs  Lab 12/05/23 0821 12/05/23 1214 12/05/23 1622 12/06/23 0817 12/06/23 1158  GLUCAP 144* 141* 123* 165* 152*    Scheduled Meds:  atorvastatin  80 mg Oral Daily   chlorthalidone   25 mg Oral Daily   enoxaparin (LOVENOX) injection  60 mg Subcutaneous Q24H   insulin  aspart  0-15 Units Subcutaneous TID WC   pantoprazole   40 mg Oral Daily   Continuous Infusions:  cefTRIAXone  (ROCEPHIN )  IV     doxycycline  (VIBRAMYCIN ) IV 100 mg (12/06/23 9385)   metronidazole  500 mg (12/06/23 0819)   potassium chloride      PRN Meds:.alum & mag hydroxide-simeth, hydrALAZINE, HYDROmorphone  (DILAUDID ) injection, ondansetron  (ZOFRAN ) IV, mouth rinse, oxyCODONE , prochlorperazine , zolpidem   Family Communication: At bedside  Disposition: Status is: Inpatient Remains inpatient appropriate because: Sepsis, abscess  Time spent: 35 minutes  Length of inpatient stay: 5 days  Author: Carliss LELON Canales, DO 12/06/2023 1:18 PM  For on call review [email or web address].

## 2023-12-06 NOTE — Progress Notes (Signed)
 Gynecology Progress Note  Admission Date: 12/01/2023 Current Date: 12/06/2023 10:40 AM  Misty Walsh is a 33 y.o. G1P0101 admitted for urosepsis and found to have 20 cm pelvic mass concerning for TOA that is not able to be drained via IR. She has been on IV ABX and has not had symptomatic improvement in her abdominal pain.    History complicated by: Patient Active Problem List   Diagnosis Date Noted   Tubo-ovarian abscess 12/02/2023   Acute pyelonephritis 12/02/2023   BMI 50.0-59.9, adult (HCC) 12/02/2023   Obesity, class 3 (HCC) 12/02/2023   Sepsis due to gram-negative UTI (HCC) 12/01/2023   Metabolic dysfunction-associated steatotic liver disease (MASLD) 12/26/2022   Class 3 severe obesity with serious comorbidity and body mass index (BMI) of 50.0 to 59.9 in adult (HCC) 11/20/2022   Physically inactive 11/20/2022   Cyst of right ovary 06/05/2022   Episodic cluster headache, not intractable 04/09/2022   Hypoventilation associated with obesity syndrome (HCC) 04/09/2022   Sleep-related headache 04/09/2022   Loud snoring 04/09/2022   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/2022   Anxiety and depression 04/09/2022   Adnexal mass 03/20/2022   Type 2 diabetes mellitus without complication, without long-term current use of insulin  (HCC) 02/08/2021   Symptomatic mammary hypertrophy 12/07/2019   Back pain 12/07/2019   Neck pain 12/07/2019   Prediabetes 11/04/2019   Hyperlipemia 11/04/2019   Vitamin D  deficiency 11/04/2019   Primary hypertension 03/22/2019   Hemorrhagic ovarian cyst 06/21/2017   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 Did not sleep well overnight.   Objective:  Patient Vitals for the past 24 hrs:  BP Temp Temp src Pulse Resp SpO2  12/06/23 0830 -- 98.4 F (36.9 C) Oral -- -- --  12/06/23 0817 (!) 154/64 -- -- (!) 105 17 95 %  12/06/23 0522 (!) 146/88 98.5 F (36.9 C) Oral (!) 102 17 95 %  12/06/23 0037 (!) 145/88 99.5 F (37.5 C) Axillary (!) 118 19 97 %  12/05/23 1932 (!) 145/95 98.3 F (36.8 C) Oral (!) 110 17 96 %  12/05/23 1527 (!) 154/107 98 F (36.7 C) Oral (!) 106 18 96 %  12/05/23 1206 (!) 161/86 98 F (36.7 C) Oral (!) 104 17 96 %    Physical exam: General appearance: alert, cooperative, appears stated age, and no distress Abdomen: soft, non-tender; bowel sounds normal; no masses,  no organomegaly GU: No gross VB Lungs: clear to auscultation bilaterally Heart: Regular rate Extremities: no lower extremity edema Skin: Intact Psych: appropriate Neurologic: Grossly normal  Medications Current Facility-Administered Medications  Medication Dose Route Frequency Provider Last Rate Last Admin   alum & mag hydroxide-simeth (MAALOX/MYLANTA) 200-200-20 MG/5ML suspension 30 mL  30 mL Oral Q4H PRN Jadine Toribio SQUIBB, MD   30 mL at 12/03/23 0321   atorvastatin (LIPITOR) tablet 80 mg  80 mg Oral Daily Lue Elsie BROCKS, MD   80 mg at 12/05/23 1002   cefTRIAXone  (ROCEPHIN ) 2 g in sodium chloride  0.9 % 100 mL IVPB  2 g Intravenous Q24H Calkins, Derek W, DO       chlorthalidone  (HYGROTON ) tablet 25 mg  25 mg Oral Daily  Lue Elsie BROCKS, MD   25 mg at 12/05/23 1002   doxycycline  (VIBRAMYCIN ) 100 mg in sodium chloride  0.9 % 250 mL IVPB  100 mg Intravenous Q12H Jadine Toribio SQUIBB, MD 125 mL/hr at 12/06/23 0614 100 mg at 12/06/23 0614   enoxaparin (LOVENOX) injection 60 mg  60 mg Subcutaneous Q24H Cleatus Moccasin, MD   60 mg at 12/06/23 1005   hydrALAZINE (APRESOLINE) injection 5 mg  5 mg Intravenous Q6H PRN Jadine Toribio SQUIBB, MD   5 mg at 12/04/23 9090   HYDROmorphone  (DILAUDID )  injection 0.5 mg  0.5 mg Intravenous Q4H PRN Abigail Rollo DASEN, MD   0.5 mg at 12/06/23 0945   insulin  aspart (novoLOG) injection 0-15 Units  0-15 Units Subcutaneous TID WC Kathrin Simmer T, MD   3 Units at 12/06/23 0840   metroNIDAZOLE  (FLAGYL ) IVPB 500 mg  500 mg Intravenous Q12H Jadine Toribio SQUIBB, MD 100 mL/hr at 12/06/23 0819 500 mg at 12/06/23 9180   ondansetron  (ZOFRAN ) injection 4 mg  4 mg Intravenous Q6H PRN Jadine Toribio SQUIBB, MD   4 mg at 12/06/23 9483   Oral care mouth rinse  15 mL Mouth Rinse PRN Jadine Toribio SQUIBB, MD       oxyCODONE  (Oxy IR/ROXICODONE ) immediate release tablet 5-10 mg  5-10 mg Oral Q4H PRN Lue Elsie BROCKS, MD   5 mg at 12/04/23 1431   pantoprazole  (PROTONIX ) EC tablet 40 mg  40 mg Oral Daily Lue Elsie BROCKS, MD   40 mg at 12/05/23 1002   potassium chloride  (KLOR-CON ) packet 40 mEq  40 mEq Oral BID Arlon Honey W, DO       prochlorperazine  (COMPAZINE ) injection 10 mg  10 mg Intravenous Q6H PRN Jadine Toribio SQUIBB, MD   10 mg at 12/06/23 0835      Labs  Recent Labs  Lab 12/04/23 0351 12/05/23 0730 12/06/23 0418  WBC 21.1* 21.9* 20.6*  HGB 10.0* 10.6* 10.4*  HCT 31.2* 31.4* 31.9*  PLT 261 306 343    Recent Labs  Lab 12/01/23 1312 12/03/23 0357 12/04/23 0351 12/05/23 0730 12/06/23 0418  NA 131*   < > 138 135 137  K 3.2*   < > 3.2* 2.8* 3.0*  CL 93*   < > 100 97* 98  CO2 22   < > 23 19* 23  BUN 9   < > 7 9 9   CREATININE 1.17*   < > 1.00 1.30* 1.30*  CALCIUM  9.7   < > 9.4 8.7* 8.7*  PROT 8.1  --   --  7.0 7.1  BILITOT 0.8  --   --  0.6 0.7  ALKPHOS 93  --   --  75 84  ALT 29  --   --  27 28  AST 26  --   --  36 46*  GLUCOSE 201*   < > 136* 147* 152*   < > = values in this interval not displayed.   Assessment & Plan:  *GYN: Surgery is planned for Monday. See my consent note from 10/17. She has no additional questions this morning.  *ID: No fever, WBC slight drop from yesterday. Continue ABX.  *Pain: Adequately controlled at this time  although still in pain.  *FEN/GI: Has antiemetics on board. Regular diet as tolerated. Has tolerated minimal PO intake. NPO after midnight Sunday. Hypokalemia will need repletion (getting Klor). Creatinine is stable.  *PPx: Continue Lovenox through tomorrow but will hold on day of surgery *Ambien  added for sleep *Dispo:  Pending recovery after surgery.   Code Status: Full Code  Total time taking care of the patient was 30 minutes, with greater than 50% of the time spent in face to face interaction with the patient.  Vina Solian, MD Attending Center for [facility])

## 2023-12-07 DIAGNOSIS — A419 Sepsis, unspecified organism: Secondary | ICD-10-CM

## 2023-12-07 DIAGNOSIS — N179 Acute kidney failure, unspecified: Secondary | ICD-10-CM

## 2023-12-07 LAB — BASIC METABOLIC PANEL WITH GFR
Anion gap: 13 (ref 5–15)
BUN: 7 mg/dL (ref 6–20)
CO2: 24 mmol/L (ref 22–32)
Calcium: 8.6 mg/dL — ABNORMAL LOW (ref 8.9–10.3)
Chloride: 99 mmol/L (ref 98–111)
Creatinine, Ser: 1.06 mg/dL — ABNORMAL HIGH (ref 0.44–1.00)
GFR, Estimated: >60 mL/min (ref >60)
Glucose, Bld: 144 mg/dL — ABNORMAL HIGH (ref 70–99)
Potassium: 3.2 mmol/L — ABNORMAL LOW (ref 3.5–5.1)
Sodium: 136 mmol/L (ref 135–145)

## 2023-12-07 LAB — COMPREHENSIVE METABOLIC PANEL WITH GFR
ALT: 24 U/L (ref 0–44)
AST: 37 U/L (ref 15–41)
Albumin: 2.1 g/dL — ABNORMAL LOW (ref 3.5–5.0)
Alkaline Phosphatase: 66 U/L (ref 38–126)
Anion gap: 13 (ref 5–15)
BUN: 7 mg/dL (ref 6–20)
CO2: 25 mmol/L (ref 22–32)
Calcium: 8.5 mg/dL — ABNORMAL LOW (ref 8.9–10.3)
Chloride: 99 mmol/L (ref 98–111)
Creatinine, Ser: 1.1 mg/dL — ABNORMAL HIGH (ref 0.44–1.00)
GFR, Estimated: >60 mL/min (ref >60)
Glucose, Bld: 138 mg/dL — ABNORMAL HIGH (ref 70–99)
Potassium: 2.9 mmol/L — ABNORMAL LOW (ref 3.5–5.1)
Sodium: 137 mmol/L (ref 135–145)
Total Bilirubin: 0.5 mg/dL (ref 0.0–1.2)
Total Protein: 6.4 g/dL — ABNORMAL LOW (ref 6.5–8.1)

## 2023-12-07 LAB — CBC
HCT: 29.8 % — ABNORMAL LOW (ref 36.0–46.0)
Hemoglobin: 9.8 g/dL — ABNORMAL LOW (ref 12.0–15.0)
MCH: 31 pg (ref 26.0–34.0)
MCHC: 32.9 g/dL (ref 30.0–36.0)
MCV: 94.3 fL (ref 80.0–100.0)
Platelets: 338 K/uL (ref 150–400)
RBC: 3.16 MIL/uL — ABNORMAL LOW (ref 3.87–5.11)
RDW: 13.1 % (ref 11.5–15.5)
WBC: 17.6 K/uL — ABNORMAL HIGH (ref 4.0–10.5)
nRBC: 0 % (ref 0.0–0.2)

## 2023-12-07 LAB — MAGNESIUM: Magnesium: 1.9 mg/dL (ref 1.7–2.4)

## 2023-12-07 LAB — GLUCOSE, CAPILLARY
Glucose-Capillary: 133 mg/dL — ABNORMAL HIGH (ref 70–99)
Glucose-Capillary: 145 mg/dL — ABNORMAL HIGH (ref 70–99)
Glucose-Capillary: 148 mg/dL — ABNORMAL HIGH (ref 70–99)
Glucose-Capillary: 121 mg/dL — ABNORMAL HIGH (ref 70–99)
Glucose-Capillary: 164 mg/dL — ABNORMAL HIGH (ref 70–99)

## 2023-12-07 LAB — CK: Total CK: 355 U/L — ABNORMAL HIGH (ref 38–234)

## 2023-12-07 MED ORDER — POTASSIUM CHLORIDE 10 MEQ/100ML IV SOLN
10.0000 meq | INTRAVENOUS | Status: AC
  Administered 2023-12-07 (×4): 10 meq via INTRAVENOUS
  Filled 2023-12-07 (×4): qty 100

## 2023-12-07 MED ORDER — POTASSIUM CHLORIDE IN NACL 20-0.45 MEQ/L-% IV SOLN
INTRAVENOUS | Status: AC
  Filled 2023-12-07 (×2): qty 1000

## 2023-12-07 MED ORDER — POTASSIUM CHLORIDE 20 MEQ PO PACK
20.0000 meq | PACK | Freq: Two times a day (BID) | ORAL | Status: DC
  Administered 2023-12-08 – 2023-12-10 (×3): 20 meq via ORAL
  Filled 2023-12-07 (×6): qty 1

## 2023-12-07 MED ORDER — ACETAMINOPHEN 325 MG PO TABS
650.0000 mg | ORAL_TABLET | Freq: Once | ORAL | Status: AC
  Administered 2023-12-07: 650 mg via ORAL
  Filled 2023-12-07: qty 2

## 2023-12-07 NOTE — Progress Notes (Signed)
 Progress Note   Patient: Misty Walsh FMW:[id number] DOB: 08-11-1990 DOA: 12/01/2023  DOS: the patient was seen and examined on 12/07/2023   Brief hospital course:  33 year old woman PMH including diabetes, presented with abdominal pain nausea and dry heaving, p/w sepsis and huge suspected tubo-ovarian abscess.  Transferred [facility] to [facility].   Assessment and Plan:   Concern for sepsis - Fever, leukocytosis, noted large abscess given concern for sepsis.  Blood cultures, IV fluid bolus, empiric broad-spectrum antibiotics on board.  Monitoring leukocytosis and blood pressure closely.   Suspected large tubo-ovarian abscess - MRI noting 22.4 cm complex cystic mass centered in the right adnexa suspicious for abscess.  OB/Gyn and IR following closely.  Evaluation by IR stating masses too deep and too complex for simple IR drainage.  OB/Gyn reaching out to Gen surg for impending surgery Monday.  Leukocytosis show only minimal improvement, still greater than 20.  Continues on broad-spectrum empiric antibiotics with ceftriaxone , Doxy, Flagyl .   Hypokalemia - Slightly worse this morning.  Patient not tolerating p.o. so we will order another IV KCl replenishment.  IV fluids changed to half NS with 20 of K.  Will recheck BMP and magnesium  in AM.   Acute kidney injury - Creatinine showing improvement this morning.  IV fluid hydration on board.  Urine output excellent.  Foley in placed.  Some suspicion of possible obstruction given size and location of abscess above.  Continue to monitor renal function closely.  Will monitor urine output and recheck kidney function in AM.   Diabetes mellitus - A1c 6.3 suggesting excellent control at home.  Insulin  sliding scale ordered.  P.o. intake poor.   Class III obesity - BMI greater than 51.  Encourage lifestyle modifications after hospitalization.   Subjective: Patient resting this morning.  Still having abdominal pain, nausea with very poor p.o.  intake. Denies any fevers, shortness of breath, chest pain, vomiting.   Physical Exam:  Vitals:   12/06/23 2356 12/07/23 0441 12/07/23 0844 12/07/23 1229  BP: (!) 146/84 (!) 144/81 139/82 (!) 142/85  Pulse: 95 86 89 90  Resp: 19 18 16 17   Temp: 98.3 F (36.8 C) 98 F (36.7 C) 98.9 F (37.2 C) 98.7 F (37.1 C)  TempSrc: Oral Oral Oral Oral  SpO2: 95% 93% 95% 96%  Weight:      Height:        GENERAL:  Alert but lethargic, obese, pleasant, mild acute distress  HEENT:  EOMI CARDIOVASCULAR:  RRR, no murmurs appreciated RESPIRATORY:  Clear to auscultation, no wheezing, rales, or rhonchi GASTROINTESTINAL:  Soft, diffusely mildly tender EXTREMITIES:  No LE edema bilaterally NEURO:  No new focal deficits appreciated SKIN:  No rashes noted PSYCH:  Appropriate mood and affect, anxious   Data Reviewed:  Imaging Studies: DG Chest Port 1 View Result Date: 12/03/2023 EXAM: 1 VIEW(S) XRAY OF THE CHEST 12/03/2023 08:34:00 AM COMPARISON: 12/01/2023 CLINICAL HISTORY: Dyspnea FINDINGS: LUNGS AND PLEURA: Poor inspiratory effort. No focal infiltrate is seen. Left basilar atelectasis has resolved. HEART AND MEDIASTINUM: No acute abnormality of the cardiac and mediastinal silhouettes. BONES AND SOFT TISSUES: No acute osseous abnormality. IMPRESSION: 1. Resolution of prior left basilar atelectasis. Electronically signed by: Oneil Devonshire MD 12/03/2023 11:45 AM EDT RP Workstation: [id number]   MR PELVIS W WO CONTRAST Result Date: 12/02/2023 CLINICAL DATA:  Pelvic pain, nausea, and vomiting. Pelvic mass on recent CT. EXAM: MRI PELVIS WITHOUT AND WITH CONTRAST TECHNIQUE: Multiplanar multisequence MR imaging of the pelvis was  performed both before and after administration of intravenous contrast. CONTRAST:  10mL GADAVIST GADOBUTROL 1 MMOL/ML IV SOLN COMPARISON:  CT on 12/01/2023 FINDINGS: Lower Urinary Tract: Bladder is displaced anteriorly by large cystic mass in central pelvis, however, no intrinsic  bladder abnormality identified. Bowel: Unremarkable pelvic bowel loops. Vascular/Lymphatic: Unremarkable. No pathologically enlarged pelvic lymph nodes identified. Reproductive: -- Uterus: Measures 11.2 x 3.5 by 5.5 cm. Displaced anteriorly and to the left by large cystic mass in central pelvis. No otherwise unremarkable in appearance. -- Right ovary: No normal ovary visualized. A complex cystic lesion is seen which is centered in the right adnexa, displacing the rectum and uterus to the left. This lesion contains multiple internal cystic components with T1 hypointense fluid and peripheral enhancement, some of which appear tubular in shape. An internal air-fluid level is also noted. There is surrounding edema and tiny amount of free fluid. This measures 22.4 by 8.0 by 9.8 cm. This is highly suspicious for a large tubo-ovarian abscess, with cystic ovarian neoplasm and endometriosis considered less likely. -- Left ovary:  No normal ovary visualized. Other: Tiny amount of free fluid seen in intraperitoneal and presacral spaces. Musculoskeletal:  Unremarkable. IMPRESSION: 22.4 cm complex cystic mass centered in right adnexa. Features are highly suspicious for a large tubo-ovarian abscess, with cystic ovarian neoplasm and endometriosis considered less likely. Electronically Signed   By: Norleen DELENA Kil M.D.   On: 12/02/2023 16:13   CT ABDOMEN PELVIS W CONTRAST Result Date: 12/01/2023 EXAM: CT ABDOMEN AND PELVIS WITH CONTRAST 12/01/2023 04:32:27 PM TECHNIQUE: CT of the abdomen and pelvis was performed with the administration of 100 mL of iohexol  (OMNIPAQUE ) 300 MG/ML solution. Multiplanar reformatted images are provided for review. Automated exposure control, iterative reconstruction, and/or weight-based adjustment of the mA/kV was utilized to reduce the radiation dose to as low as reasonably achievable. COMPARISON: Comparison with 02/13/2022 and ultrasound 08/14/2022. CLINICAL HISTORY: N/V. Eval for pyelonephritis vs  nephrolithiasis. Triage Notes: Patient BIB [facility] from home. Abdominal pain for 5 days, nausea and vomiting. 3 days ago prescribed antibiotic for UTI but trouble keeping the medication down. Said her urine culture came back positive today. Had covid 1 week ago. FINDINGS: LOWER CHEST: No acute abnormality. LIVER: Hepatic steatosis. GALLBLADDER AND BILE DUCTS: Gallbladder is unremarkable. No biliary ductal dilatation. SPLEEN: No acute abnormality. PANCREAS: No acute abnormality. ADRENAL GLANDS: No acute abnormality. KIDNEYS, URETERS AND BLADDER: No stones in the kidneys or ureters. No hydronephrosis. No perinephric or periureteral stranding. Urinary bladder is unremarkable. GI AND BOWEL: Stomach demonstrates no acute abnormality. There is no bowel obstruction. Normal appendix. PERITONEUM AND RETROPERITONEUM: No ascites. No free air. VASCULATURE: Aorta is normal in caliber. LYMPH NODES: No lymphadenopathy. REPRODUCTIVE ORGANS: Right hydrosalpinx has increased compared to 02/13/2022. Comparison with ultrasound 08/14/2022 is limited due to differences in technique. Complex right ovarian cystic lesion has increased from the prior CT and measures 6.1 x 5.2 cm. There is a new low-density cystic lesion measuring 7.2 x 5.3 x 7.7 cm. The lesion is superior to the uterus and bladder. The origin is unclear. This may arise from the complex right paraovarian cyst or the left ovary. BONES AND SOFT TISSUES: No acute osseous abnormality. No focal soft tissue abnormality. IMPRESSION: 1. New low-density cystic lesion measuring 7.7 cm, superior to the uterus and bladder, with unclear origin; may arise from the complex right paraovarian cyst or the left ovary. 2. Complex right ovarian cystic lesion has increased from the prior CT and measures 6.1 cm. 3. Right hydrosalpinx  has increased compared to prior CT. 4. Recommend gynecologic consultation and/or MRI w/ IV contrast for further characterization . Electronically signed by: Norman Gatlin MD 12/01/2023 05:20 PM EDT RP Workstation: [id number]   DG Chest Portable 1 View Result Date: 12/01/2023 CLINICAL DATA:  Shortness of breath, sepsis EXAM: PORTABLE CHEST 1 VIEW COMPARISON:  09/19/2016 FINDINGS: Low lung volumes are present, causing crowding of the pulmonary vasculature. Bandlike opacity just above the left hemidiaphragm probably representing atelectasis in the left lower lobe. Hazy density along the right hemithorax likely mostly attributable to soft tissues of the chest wall and the underlying low lung volumes, no discrete airspace opacity identified on the right. No blunting the costophrenic angles. No significant bony findings. IMPRESSION: 1. Low lung volumes are present, causing crowding of the pulmonary vasculature. 2. Bandlike opacity just above the left hemidiaphragm probably representing atelectasis in the left lower lobe. Electronically Signed   By: Ryan Salvage M.D.   On: 12/01/2023 13:44    There are no new results to review at this time.  Previous records (including but not limited to H&P, progress notes, nursing notes, TOC management) were reviewed in assessment of this patient.  Labs: CBC: Recent Labs  Lab 12/01/23 1312 12/03/23 0357 12/04/23 0351 12/05/23 0730 12/06/23 0418 12/07/23 0426  WBC 21.7* 20.2* 21.1* 21.9* 20.6* 17.6*  NEUTROABS 19.2*  --   --   --   --   --   HGB 12.7 10.4* 10.0* 10.6* 10.4* 9.8*  HCT 39.5 35.2* 31.2* 31.4* 31.9* 29.8*  MCV 93.8 100.3* 95.7 91.8 93.3 94.3  PLT 262 238 261 306 343 338   Basic Metabolic Panel: Recent Labs  Lab 12/02/23 0003 12/03/23 0357 12/04/23 0351 12/05/23 0730 12/06/23 0418 12/07/23 0426  NA  --  136 138 135 137 137  K  --  3.7 3.2* 2.8* 3.0* 2.9*  CL  --  101 100 97* 98 99  CO2  --  20* 23 19* 23 25  GLUCOSE  --  146* 136* 147* 152* 138*  BUN  --  7 7 9 9 7   CREATININE  --  0.96 1.00 1.30* 1.30* 1.10*  CALCIUM   --  9.0 9.4 8.7* 8.7* 8.5*  MG 2.2  --   --  2.0 2.3 1.9   Liver  Function Tests: Recent Labs  Lab 12/01/23 1312 12/05/23 0730 12/06/23 0418 12/07/23 0426  AST 26 36 46* 37  ALT 29 27 28 24   ALKPHOS 93 75 84 66  BILITOT 0.8 0.6 0.7 0.5  PROT 8.1 7.0 7.1 6.4*  ALBUMIN 3.8 2.4* 2.4* 2.1*   CBG: Recent Labs  Lab 12/06/23 0817 12/06/23 1158 12/06/23 1726 12/07/23 0156 12/07/23 0518  GLUCAP 165* 152* 172* 133* 145*    Scheduled Meds:  atorvastatin  80 mg Oral Daily   chlorthalidone   25 mg Oral Daily   insulin  aspart  0-15 Units Subcutaneous TID WC   pantoprazole   40 mg Oral Daily   potassium chloride   20 mEq Oral BID   Continuous Infusions:  0.45 % NaCl with KCl 20 mEq / L     cefTRIAXone  (ROCEPHIN )  IV 2 g (12/06/23 2009)   doxycycline  (VIBRAMYCIN ) IV 100 mg (12/07/23 0555)   metronidazole  500 mg (12/07/23 0832)   PRN Meds:.alum & mag hydroxide-simeth, hydrALAZINE, HYDROmorphone  (DILAUDID ) injection, ondansetron  (ZOFRAN ) IV, mouth rinse, oxyCODONE , prochlorperazine , zolpidem   Family Communication: None at bedside  Disposition: Status is: Inpatient Remains inpatient appropriate because: Sepsis     Time spent:  37 minutes  Length of inpatient stay: 6 days  Author: Carliss LELON Canales, DO 12/07/2023 12:34 PM  For on call review [email or web address].

## 2023-12-07 NOTE — Plan of Care (Signed)

## 2023-12-07 NOTE — Progress Notes (Signed)
 Gynecology Progress Note  Admission Date: 12/01/2023 Current Date: 12/07/2023 9:50 AM  Misty Walsh is a 33 y.o. G1P0101 admitted for urosepsis and found to have 20 cm pelvic mass concerning for TOA that is not able to be drained via IR. She has been on IV ABX and has not had symptomatic improvement in her abdominal pain.    History complicated by: Patient Active Problem List   Diagnosis Date Noted   Tubo-ovarian abscess 12/02/2023   Acute pyelonephritis 12/02/2023   BMI 50.0-59.9, adult (HCC) 12/02/2023   Obesity, class 3 (HCC) 12/02/2023   Sepsis due to gram-negative UTI (HCC) 12/01/2023   Metabolic dysfunction-associated steatotic liver disease (MASLD) 12/26/2022   Class 3 severe obesity with serious comorbidity and body mass index (BMI) of 50.0 to 59.9 in adult (HCC) 11/20/2022   Physically inactive 11/20/2022   Cyst of right ovary 06/05/2022   Episodic cluster headache, not intractable 04/09/2022   Hypoventilation associated with obesity syndrome (HCC) 04/09/2022   Sleep-related headache 04/09/2022   Loud snoring 04/09/2022   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/2022   Anxiety and depression 04/09/2022   Adnexal mass 03/20/2022   Type 2 diabetes mellitus without complication, without long-term current use of insulin  (HCC) 02/08/2021   Symptomatic mammary hypertrophy 12/07/2019   Back pain 12/07/2019   Neck pain 12/07/2019   Prediabetes 11/04/2019   Hyperlipemia 11/04/2019   Vitamin D  deficiency 11/04/2019   Primary hypertension 03/22/2019   Hemorrhagic ovarian cyst 06/21/2017   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 Poor appetite. Has nausea but primarily spitting.   Objective:  Patient Vitals for the past 24 hrs:  BP Temp Temp src Pulse Resp SpO2  12/07/23 0441 (!) 144/81 98 F (36.7 C) Oral 86 18 93 %  12/06/23 2356 (!) 146/84 98.3 F (36.8 C) Oral 95 19 95 %  12/06/23 2230 -- 99.8 F (37.7 C) Oral -- -- --  12/06/23 2004 (!) 145/87 100.1 F (37.8 C) Oral (!) 111 -- 95 %  12/06/23 1638 (!) 140/89 99.7 F (37.6 C) Oral (!) 103 17 95 %  12/06/23 1158 (!) 140/86 98.4 F (36.9 C) Oral 98 16 95 %    Physical exam: General appearance: cooperative, appears stated age, fatigued, and no distress Abdomen: soft, non-tender; bowel sounds normal; no masses,  no organomegaly GU: No gross VB Lungs: clear to auscultation bilaterally Heart: Regular rate Extremities: no lower extremity edema Skin: Intact Psych: appropriate Neurologic: Grossly normal  Medications Current Facility-Administered Medications  Medication Dose Route Frequency Provider Last Rate Last Admin   0.9 %  sodium chloride  infusion   Intravenous Continuous Arlon Carliss ORN, DO 50 mL/hr at 12/07/23 0834 Rate Change at 12/07/23 0834   alum & mag hydroxide-simeth (MAALOX/MYLANTA) 200-200-20 MG/5ML suspension 30 mL  30 mL Oral Q4H PRN Jadine Toribio SQUIBB, MD   30 mL at 12/03/23 0321   atorvastatin (LIPITOR) tablet 80 mg  80 mg Oral Daily Lue Elsie BROCKS, MD   80 mg at 12/05/23 1002   cefTRIAXone  (ROCEPHIN ) 2 g in sodium chloride  0.9 % 100 mL IVPB  2 g Intravenous Q24H Arlon Carliss W, DO 200 mL/hr at  12/06/23 2009 2 g at 12/06/23 2009   chlorthalidone  (HYGROTON ) tablet 25 mg  25 mg Oral Daily Lue Elsie BROCKS, MD   25 mg at 12/05/23 1002   doxycycline  (VIBRAMYCIN ) 100 mg in sodium chloride  0.9 % 250 mL IVPB  100 mg Intravenous Q12H Jadine Toribio SQUIBB, MD 125 mL/hr at 12/07/23 0555 100 mg at 12/07/23 0555   enoxaparin (LOVENOX) injection 60 mg  60 mg Subcutaneous Q24H Cleatus Moccasin, MD   60 mg at 12/06/23 1005    hydrALAZINE (APRESOLINE) injection 5 mg  5 mg Intravenous Q6H PRN Jadine Toribio SQUIBB, MD   5 mg at 12/04/23 9090   HYDROmorphone  (DILAUDID ) injection 0.5 mg  0.5 mg Intravenous Q4H PRN Abigail Rollo DASEN, MD   0.5 mg at 12/07/23 0836   insulin  aspart (novoLOG) injection 0-15 Units  0-15 Units Subcutaneous TID WC Kathrin Simmer T, MD   2 Units at 12/07/23 0839   metroNIDAZOLE  (FLAGYL ) IVPB 500 mg  500 mg Intravenous Q12H Jadine Toribio SQUIBB, MD 100 mL/hr at 12/07/23 0832 500 mg at 12/07/23 9167   ondansetron  (ZOFRAN ) injection 4 mg  4 mg Intravenous Q6H PRN Jadine Toribio SQUIBB, MD   4 mg at 12/07/23 9165   Oral care mouth rinse  15 mL Mouth Rinse PRN Jadine Toribio SQUIBB, MD       oxyCODONE  (Oxy IR/ROXICODONE ) immediate release tablet 5-10 mg  5-10 mg Oral Q4H PRN Lue Elsie BROCKS, MD   5 mg at 12/04/23 1431   pantoprazole  (PROTONIX ) EC tablet 40 mg  40 mg Oral Daily Lue Elsie BROCKS, MD   40 mg at 12/06/23 1804   potassium chloride  (KLOR-CON ) packet 20 mEq  20 mEq Oral BID Arlon Honey W, DO       potassium chloride  10 mEq in 100 mL IVPB  10 mEq Intravenous Q1 Hr x 4 Arlon Honey ORN, DO 100 mL/hr at 12/07/23 0930 10 mEq at 12/07/23 0930   prochlorperazine  (COMPAZINE ) injection 10 mg  10 mg Intravenous Q6H PRN Jadine Toribio SQUIBB, MD   10 mg at 12/06/23 2125   zolpidem  (AMBIEN ) tablet 5 mg  5 mg Oral QHS PRN Cleatus Moccasin, MD   5 mg at 12/06/23 2123      Labs  Recent Labs  Lab 12/05/23 0730 12/06/23 0418 12/07/23 0426  WBC 21.9* 20.6* 17.6*  HGB 10.6* 10.4* 9.8*  HCT 31.4* 31.9* 29.8*  PLT 306 343 338    Recent Labs  Lab 12/05/23 0730 12/06/23 0418 12/07/23 0426  NA 135 137 137  K 2.8* 3.0* 2.9*  CL 97* 98 99  CO2 19* 23 25  BUN 9 9 7   CREATININE 1.30* 1.30* 1.10*  CALCIUM  8.7* 8.7* 8.5*  PROT 7.0 7.1 6.4*  BILITOT 0.6 0.7 0.5  ALKPHOS 75 84 66  ALT 27 28 24   AST 36 46* 37  GLUCOSE 147* 152* 138*   Assessment & Plan:  *GYN: Surgery is planned for Monday at 130 -  confirmed with OR. See my consent note from 10/17. She has no additional questions this morning. Preop orders placed.  *ID: No fever, WBC dropped again from yesterday. Continue ABX. *Pain: Adequately controlled at this time although still in pain.  *FEN/GI: Has antiemetics on board. Regular diet as tolerated. Has tolerated minimal PO intake. NPO after midnight Sunday. Hypokalemia being repleted (getting Klor). Creatinine is stable.  *PPx: Hold Lovenox after today's dose *Still poor sleep despite ambien .  *Dispo: Pending recovery after surgery.   Code Status:  Full Code  Total time taking care of the patient was 30 minutes, with greater than 50% of the time spent in face to face interaction with the patient.  Vina Solian, MD Attending Center for [facility])

## 2023-12-07 NOTE — Progress Notes (Signed)
   12/07/23 1050  Spiritual Encounters  Type of Visit Initial  Care provided to: Patient  Conversation partners present during encounter Nurse  Referral source Patient request;Nurse (RN/NT/LPN)  Reason for visit Surgical  OnCall Visit Yes  Spiritual Framework  Presenting Themes Meaning/purpose/sources of inspiration;Impactful experiences and emotions;Values and beliefs;Caregiving needs;Courage hope and growth  Patient Stress Factors Exhausted;Health changes   Patient has surgery scheduled tomorrow and requested a chaplain because she was sad about the surgery and possibly scared.   Went in and offered prayer and let patient know if she needed the care team please reach back out.

## 2023-12-08 ENCOUNTER — Inpatient Hospital Stay (HOSPITAL_COMMUNITY): Payer: Self-pay | Admitting: Anesthesiology

## 2023-12-08 ENCOUNTER — Encounter (HOSPITAL_COMMUNITY): Payer: Self-pay | Admitting: Student

## 2023-12-08 ENCOUNTER — Encounter (HOSPITAL_COMMUNITY)
Admission: EM | Disposition: A | Discharge status: Home / Self Care | Payer: Self-pay | Source: Home / Self Care | Attending: Obstetrics and Gynecology

## 2023-12-08 DIAGNOSIS — Z87891 Personal history of nicotine dependence: Secondary | ICD-10-CM

## 2023-12-08 DIAGNOSIS — R109 Unspecified abdominal pain: Secondary | ICD-10-CM

## 2023-12-08 DIAGNOSIS — I10 Essential (primary) hypertension: Secondary | ICD-10-CM

## 2023-12-08 DIAGNOSIS — E876 Hypokalemia: Secondary | ICD-10-CM

## 2023-12-08 DIAGNOSIS — F418 Other specified anxiety disorders: Secondary | ICD-10-CM

## 2023-12-08 HISTORY — PX: ABDOMINAL HYSTERECTOMY: SHX81

## 2023-12-08 LAB — GLUCOSE, CAPILLARY
Glucose-Capillary: 164 mg/dL — ABNORMAL HIGH (ref 70–99)
Glucose-Capillary: 191 mg/dL — ABNORMAL HIGH (ref 70–99)
Glucose-Capillary: 128 mg/dL — ABNORMAL HIGH (ref 70–99)
Glucose-Capillary: 169 mg/dL — ABNORMAL HIGH (ref 70–99)
Glucose-Capillary: 198 mg/dL — ABNORMAL HIGH (ref 70–99)
Glucose-Capillary: 207 mg/dL — ABNORMAL HIGH (ref 70–99)

## 2023-12-08 LAB — PREPARE RBC (CROSSMATCH)

## 2023-12-08 LAB — CBC
HCT: 31.4 % — ABNORMAL LOW (ref 36.0–46.0)
Hemoglobin: 10.2 g/dL — ABNORMAL LOW (ref 12.0–15.0)
MCH: 30.2 pg (ref 26.0–34.0)
MCHC: 32.5 g/dL (ref 30.0–36.0)
MCV: 92.9 fL (ref 80.0–100.0)
Platelets: 254 K/uL (ref 150–400)
RBC: 3.38 MIL/uL — ABNORMAL LOW (ref 3.87–5.11)
RDW: 12.9 % (ref 11.5–15.5)
WBC: 18.3 K/uL — ABNORMAL HIGH (ref 4.0–10.5)
nRBC: 0 % (ref 0.0–0.2)

## 2023-12-08 LAB — POCT PREGNANCY, URINE: Preg Test, Ur: NEGATIVE

## 2023-12-08 LAB — COMPREHENSIVE METABOLIC PANEL WITH GFR
ALT: 22 U/L (ref 0–44)
AST: 32 U/L (ref 15–41)
Albumin: 2.1 g/dL — ABNORMAL LOW (ref 3.5–5.0)
Alkaline Phosphatase: 67 U/L (ref 38–126)
Anion gap: 13 (ref 5–15)
BUN: 6 mg/dL (ref 6–20)
CO2: 27 mmol/L (ref 22–32)
Calcium: 8.8 mg/dL — ABNORMAL LOW (ref 8.9–10.3)
Chloride: 96 mmol/L — ABNORMAL LOW (ref 98–111)
Creatinine, Ser: 1.14 mg/dL — ABNORMAL HIGH (ref 0.44–1.00)
GFR, Estimated: >60 mL/min (ref >60)
Glucose, Bld: 156 mg/dL — ABNORMAL HIGH (ref 70–99)
Potassium: 3.1 mmol/L — ABNORMAL LOW (ref 3.5–5.1)
Sodium: 136 mmol/L (ref 135–145)
Total Bilirubin: 0.6 mg/dL (ref 0.0–1.2)
Total Protein: 6.9 g/dL (ref 6.5–8.1)

## 2023-12-08 LAB — PROTIME-INR
INR: 1.2 (ref 0.8–1.2)
Prothrombin Time: 16.3 s — ABNORMAL HIGH (ref 11.4–15.2)

## 2023-12-08 LAB — MAGNESIUM: Magnesium: 1.9 mg/dL (ref 1.7–2.4)

## 2023-12-08 SURGERY — HYSTERECTOMY, ABDOMINAL
Anesthesia: General

## 2023-12-08 MED ORDER — OXYCODONE HCL 5 MG PO TABS: 5.0000 mg | ORAL_TABLET | Freq: Once | ORAL | Status: DC | PRN

## 2023-12-08 MED ORDER — PROPOFOL 10 MG/ML IV BOLUS
INTRAVENOUS | Status: AC
  Filled 2023-12-08: qty 20

## 2023-12-08 MED ORDER — FENTANYL CITRATE (PF) 100 MCG/2ML IJ SOLN
INTRAMUSCULAR | Status: AC
  Filled 2023-12-08: qty 2

## 2023-12-08 MED ORDER — ROCURONIUM BROMIDE 10 MG/ML (PF) SYRINGE
PREFILLED_SYRINGE | INTRAVENOUS | Status: AC
  Filled 2023-12-08: qty 10

## 2023-12-08 MED ORDER — KETAMINE HCL 50 MG/5ML IJ SOSY
PREFILLED_SYRINGE | INTRAMUSCULAR | Status: AC
  Filled 2023-12-08: qty 5

## 2023-12-08 MED ORDER — NALOXONE HCL 0.4 MG/ML IJ SOLN: 0.4000 mg | INTRAMUSCULAR | Status: DC | PRN

## 2023-12-08 MED ORDER — ACETAMINOPHEN 500 MG PO TABS
1000.0000 mg | ORAL_TABLET | ORAL | Status: AC
  Administered 2023-12-08: 1000 mg via ORAL
  Filled 2023-12-08: qty 2

## 2023-12-08 MED ORDER — SODIUM CHLORIDE 0.9 % IV SOLN
2.0000 g | INTRAVENOUS | Status: AC
  Administered 2023-12-08: 2 g via INTRAVENOUS
  Filled 2023-12-08 (×2): qty 2

## 2023-12-08 MED ORDER — AMISULPRIDE (ANTIEMETIC) 5 MG/2ML IV SOLN
10.0000 mg | Freq: Once | INTRAVENOUS | Status: DC | PRN
Start: 2023-12-08 — End: 2023-12-08

## 2023-12-08 MED ORDER — ONDANSETRON HCL 4 MG/2ML IJ SOLN
INTRAMUSCULAR | Status: DC | PRN
  Administered 2023-12-08: 4 mg via INTRAVENOUS

## 2023-12-08 MED ORDER — HYDROMORPHONE HCL 1 MG/ML IJ SOLN
INTRAMUSCULAR | Status: AC
  Filled 2023-12-08: qty 0.5

## 2023-12-08 MED ORDER — GLYCOPYRROLATE PF 0.2 MG/ML IJ SOSY
PREFILLED_SYRINGE | INTRAMUSCULAR | Status: AC
  Filled 2023-12-08: qty 1

## 2023-12-08 MED ORDER — POTASSIUM CHLORIDE IN NACL 20-0.45 MEQ/L-% IV SOLN
INTRAVENOUS | Status: AC
  Filled 2023-12-08 (×2): qty 1000

## 2023-12-08 MED ORDER — SUCCINYLCHOLINE CHLORIDE 200 MG/10ML IV SOSY
PREFILLED_SYRINGE | INTRAVENOUS | Status: DC | PRN
  Administered 2023-12-08: 140 mg via INTRAVENOUS

## 2023-12-08 MED ORDER — CHLORHEXIDINE GLUCONATE 0.12 % MT SOLN
15.0000 mL | Freq: Once | OROMUCOSAL | Status: DC
  Filled 2023-12-08: qty 15

## 2023-12-08 MED ORDER — DEXAMETHASONE SOD PHOSPHATE PF 10 MG/ML IJ SOLN
INTRAMUSCULAR | Status: DC | PRN
Start: 2023-12-08 — End: 2023-12-08
  Administered 2023-12-08: 4 mg via INTRAVENOUS

## 2023-12-08 MED ORDER — SOD CITRATE-CITRIC ACID 500-334 MG/5ML PO SOLN
30.0000 mL | ORAL | Status: DC
  Filled 2023-12-08: qty 30

## 2023-12-08 MED ORDER — OXYCODONE HCL 5 MG/5ML PO SOLN: 5.0000 mg | Freq: Once | ORAL | Status: DC | PRN

## 2023-12-08 MED ORDER — LIDOCAINE 2% (20 MG/ML) 5 ML SYRINGE
INTRAMUSCULAR | Status: DC | PRN
  Administered 2023-12-08: 100 mg via INTRAVENOUS

## 2023-12-08 MED ORDER — MIDAZOLAM HCL 2 MG/2ML IJ SOLN
INTRAMUSCULAR | Status: AC
  Filled 2023-12-08: qty 2

## 2023-12-08 MED ORDER — SCOPOLAMINE 1 MG/3DAYS TD PT72
1.0000 | MEDICATED_PATCH | TRANSDERMAL | Status: DC
  Administered 2023-12-08: 1 mg via TRANSDERMAL
  Filled 2023-12-08: qty 1

## 2023-12-08 MED ORDER — POTASSIUM CHLORIDE 10 MEQ/100ML IV SOLN
10.0000 meq | INTRAVENOUS | Status: AC
  Administered 2023-12-08 (×3): 10 meq via INTRAVENOUS
  Filled 2023-12-08 (×3): qty 100

## 2023-12-08 MED ORDER — KETAMINE HCL 10 MG/ML IJ SOLN
INTRAMUSCULAR | Status: DC | PRN
Start: 2023-12-08 — End: 2023-12-08
  Administered 2023-12-08: 30 mg via INTRAVENOUS
  Administered 2023-12-08: 20 mg via INTRAVENOUS

## 2023-12-08 MED ORDER — HYDROMORPHONE HCL 1 MG/ML IJ SOLN
INTRAMUSCULAR | Status: DC | PRN
  Administered 2023-12-08 (×2): .5 mg via INTRAVENOUS

## 2023-12-08 MED ORDER — HYDROMORPHONE 1 MG/ML IV SOLN
INTRAVENOUS | Status: DC
  Administered 2023-12-08: 1.8 mg via INTRAVENOUS
  Administered 2023-12-08: 30 mg via INTRAVENOUS
  Administered 2023-12-09: 1.8 mg via INTRAVENOUS
  Administered 2023-12-09: 0.9 mg via INTRAVENOUS
  Filled 2023-12-08: qty 30

## 2023-12-08 MED ORDER — PROPOFOL 10 MG/ML IV BOLUS
INTRAVENOUS | Status: DC | PRN
  Administered 2023-12-08: 200 mg via INTRAVENOUS

## 2023-12-08 MED ORDER — MIDAZOLAM HCL (PF) 2 MG/2ML IJ SOLN
INTRAMUSCULAR | Status: DC | PRN
  Administered 2023-12-08: 2 mg via INTRAVENOUS

## 2023-12-08 MED ORDER — BUPIVACAINE LIPOSOME 1.3 % IJ SUSP
INTRAMUSCULAR | Status: AC
  Filled 2023-12-08: qty 20

## 2023-12-08 MED ORDER — HYDROMORPHONE HCL 1 MG/ML IJ SOLN: 0.2500 mg | INTRAMUSCULAR | Status: DC | PRN

## 2023-12-08 MED ORDER — DIPHENHYDRAMINE HCL 50 MG/ML IJ SOLN: 12.5000 mg | Freq: Four times a day (QID) | INTRAMUSCULAR | Status: DC | PRN

## 2023-12-08 MED ORDER — BUPIVACAINE HCL (PF) 0.25 % IJ SOLN
INTRAMUSCULAR | Status: AC
  Filled 2023-12-08: qty 30

## 2023-12-08 MED ORDER — PHENYLEPHRINE 80 MCG/ML (10ML) SYRINGE FOR IV PUSH (FOR BLOOD PRESSURE SUPPORT)
PREFILLED_SYRINGE | INTRAVENOUS | Status: DC | PRN
  Administered 2023-12-08: 80 ug via INTRAVENOUS

## 2023-12-08 MED ORDER — POVIDONE-IODINE 10 % EX SWAB
2.0000 | Freq: Once | CUTANEOUS | Status: AC
  Administered 2023-12-08: 2 via TOPICAL

## 2023-12-08 MED ORDER — FENTANYL CITRATE (PF) 250 MCG/5ML IJ SOLN
INTRAMUSCULAR | Status: DC | PRN
  Administered 2023-12-08 (×3): 50 ug via INTRAVENOUS
  Administered 2023-12-08: 100 ug via INTRAVENOUS
  Administered 2023-12-08: 50 ug via INTRAVENOUS

## 2023-12-08 MED ORDER — PHENYLEPHRINE HCL-NACL 20-0.9 MG/250ML-% IV SOLN
INTRAVENOUS | Status: DC | PRN
  Administered 2023-12-08: 50 ug/min via INTRAVENOUS

## 2023-12-08 MED ORDER — SUGAMMADEX SODIUM 200 MG/2ML IV SOLN
INTRAVENOUS | Status: DC | PRN
Start: 2023-12-08 — End: 2023-12-08
  Administered 2023-12-08: 550 mg via INTRAVENOUS

## 2023-12-08 MED ORDER — ROCURONIUM BROMIDE 10 MG/ML (PF) SYRINGE
PREFILLED_SYRINGE | INTRAVENOUS | Status: DC | PRN
Start: 2023-12-08 — End: 2023-12-08
  Administered 2023-12-08: 10 mg via INTRAVENOUS
  Administered 2023-12-08: 100 mg via INTRAVENOUS

## 2023-12-08 MED ORDER — SODIUM CHLORIDE 0.9% FLUSH: 9.0000 mL | INTRAVENOUS | Status: DC | PRN

## 2023-12-08 MED ORDER — LOPERAMIDE HCL 2 MG PO CAPS
2.0000 mg | ORAL_CAPSULE | ORAL | Status: DC | PRN
  Administered 2023-12-08: 2 mg via ORAL
  Filled 2023-12-08: qty 1

## 2023-12-08 MED ORDER — POTASSIUM CHLORIDE 10 MEQ/100ML IV SOLN
10.0000 meq | INTRAVENOUS | Status: AC
  Administered 2023-12-08 (×2): 10 meq via INTRAVENOUS
  Filled 2023-12-08 (×2): qty 100

## 2023-12-08 MED ORDER — BUPIVACAINE LIPOSOME 1.3 % IJ SUSP
INTRAMUSCULAR | Status: DC | PRN
  Administered 2023-12-08: 50 mL via SURGICAL_CAVITY

## 2023-12-08 MED ORDER — GLYCOPYRROLATE 0.2 MG/ML IJ SOLN
INTRAMUSCULAR | Status: DC | PRN
  Administered 2023-12-08: .2 mg via INTRAVENOUS

## 2023-12-08 MED ORDER — LACTATED RINGERS IV SOLN: INTRAVENOUS | Status: DC

## 2023-12-08 MED ORDER — DIPHENHYDRAMINE HCL 12.5 MG/5ML PO ELIX: 12.5000 mg | ORAL_SOLUTION | Freq: Four times a day (QID) | ORAL | Status: DC | PRN

## 2023-12-08 MED ORDER — ORAL CARE MOUTH RINSE: 15.0000 mL | Freq: Once | OROMUCOSAL | Status: DC

## 2023-12-08 MED ORDER — DEXMEDETOMIDINE HCL IN NACL 80 MCG/20ML IV SOLN
INTRAVENOUS | Status: DC | PRN
  Administered 2023-12-08: 12 ug via INTRAVENOUS
  Administered 2023-12-08: 8 ug via INTRAVENOUS

## 2023-12-08 SURGICAL SUPPLY — 47 items
APPLICATOR COTTON TIP 6 STRL (MISCELLANEOUS) ×1 IMPLANT
BAG COUNTER SPONGE SURGICOUNT (BAG) ×1 IMPLANT
BENZOIN TINCTURE PRP APPL 2/3 (GAUZE/BANDAGES/DRESSINGS) ×1 IMPLANT
CANISTER SUCTION 3000ML PPV (SUCTIONS) ×1 IMPLANT
COVER MAYO STAND STRL (DRAPES) ×1 IMPLANT
DRAIN 7X20 FLAT PERF LF SIL ST (DRAIN) IMPLANT
DRAPE CESAREAN BIRTH W POUCH (DRAPES) ×1 IMPLANT
DRAPE WARM FLUID 44X44 (DRAPES) IMPLANT
DRSG COVADERM 4X6 (GAUZE/BANDAGES/DRESSINGS) IMPLANT
DRSG OPSITE POSTOP 4X10 (GAUZE/BANDAGES/DRESSINGS) ×1 IMPLANT
DRSG OPSITE POSTOP 4X8 (GAUZE/BANDAGES/DRESSINGS) IMPLANT
DURAPREP 26ML APPLICATOR (WOUND CARE) ×1 IMPLANT
EVACUATOR SILICONE 100CC (DRAIN) IMPLANT
GAUZE 4X4 16PLY ~~LOC~~+RFID DBL (SPONGE) ×1 IMPLANT
GLOVE BIOGEL M 6.5 STRL (GLOVE) ×2 IMPLANT
GLOVE ECLIPSE 6.5 STRL STRAW (GLOVE) ×1 IMPLANT
GLOVE SURG UNDER POLY LF SZ7 (GLOVE) ×3 IMPLANT
GOWN STRL REUS W/ TWL LRG LVL3 (GOWN DISPOSABLE) ×3 IMPLANT
HANDLE SUCTION POOLE (INSTRUMENTS) IMPLANT
HIBICLENS CHG 4% 4OZ BTL (MISCELLANEOUS) ×1 IMPLANT
KIT TURNOVER KIT B (KITS) ×1 IMPLANT
PACK ABDOMINAL GYN (CUSTOM PROCEDURE TRAY) ×1 IMPLANT
PAD ARMBOARD POSITIONER FOAM (MISCELLANEOUS) ×1 IMPLANT
PAD OB MATERNITY 11 LF (PERSONAL CARE ITEMS) ×1 IMPLANT
PENCIL SMOKE EVACUATOR (MISCELLANEOUS) ×1 IMPLANT
POWDER SURGICEL 3.0 GRAM (HEMOSTASIS) IMPLANT
RETRACTOR TRAXI PANNICULUS (MISCELLANEOUS) IMPLANT
SOLN 0.9% NACL POUR BTL 1000ML (IV SOLUTION) ×1 IMPLANT
SPECIMEN JAR MEDIUM (MISCELLANEOUS) ×1 IMPLANT
SPIKE FLUID TRANSFER (MISCELLANEOUS) IMPLANT
SPONGE T-LAP 18X18 ~~LOC~~+RFID (SPONGE) ×1 IMPLANT
STRIP CLOSURE SKIN 1/2X4 (GAUZE/BANDAGES/DRESSINGS) ×1 IMPLANT
SUT PDS AB 1 TP1 96 (SUTURE) IMPLANT
SUT SILK 3 0 SH 30 (SUTURE) IMPLANT
SUT VIC AB 0 CT1 18XCR BRD8 (SUTURE) ×2 IMPLANT
SUT VIC AB 0 CT1 27XBRD ANBCTR (SUTURE) ×2 IMPLANT
SUT VIC AB 0 CT1 36 (SUTURE) ×1 IMPLANT
SUT VIC AB 2-0 CT1 TAPERPNT 27 (SUTURE) IMPLANT
SUT VIC AB 2-0 CTX 36 (SUTURE) IMPLANT
SUT VIC AB 2-0 SH 27XBRD (SUTURE) IMPLANT
SUT VIC AB 3-0 SH 27X BRD (SUTURE) IMPLANT
SUT VIC AB 3-0 SH 27XBRD (SUTURE) IMPLANT
SUT VIC AB 4-0 KS 27 (SUTURE) ×1 IMPLANT
SUT VICRYL 0 TIES 12 18 (SUTURE) ×1 IMPLANT
SYR BULB IRRIG 60ML STRL (SYRINGE) IMPLANT
TOWEL GREEN STERILE FF (TOWEL DISPOSABLE) ×2 IMPLANT
TRAY FOLEY W/BAG SLVR 14FR (SET/KITS/TRAYS/PACK) ×1 IMPLANT

## 2023-12-08 NOTE — Op Note (Signed)
 Preoperative diagnosis:  Tubo-ovarian abscess                                          Postoperative diagnosis:  same as above and Pelvic adhesions  Procedure:  ExLap, lysis of adhesions, removal of left cyst wall  Surgeon:  Dr. Delon Prude and Dr. Vina Solian  Anesthesia:  General endotracheal and local  Intraoperative findings: Significant pelvic adhesions noted specifically small bowel to anterior abdomen and sigmoid colon to posterior uterus.  Bilateral adnexal adhesions to pelvic side walls and colon with difficulty identifying fallopian tubes and ovaries.  EBL: 250cc IVF: 2000cc UOP: 250cc  Description of operation:    Patient was taken to the operating room and placed in the supine position where she underwent general endotracheal anesthesia.  She was then prepped and draped in the usual sterile fashion.  Foley catheter was already in place.  A midline vertical skin incision was made approximately 1 fingerbreadth above the pubis and extended to the umbilicus.  Incision was carried down sharply to the fascia which was scored in the midline and extended.  The rectus muscle was separated bluntly.  The peritoneal cavity was entered sharply.  With entry, immediate rupture of large cyst noted with serosanguinous fluid.  Blunt dissection was used to break up some of the immediate adhesions for better visualization.  The [facility] retractor was then placed to improve visualization.  The upper abdomen was packed with wet laps and limited due to adhesions of the bowel to the anterior abdominal wall. Continued exploration was performed using blunt dissection an additional large posterior area of loculation was noted and drained.  Copious irrigation was completed.  Attention, was turned back to the left adnexa, a ruptured cyst wall was noted; however, due to the adhesions, the cyst wall could not be traced to any normal appearing fallopian tube or ovary.  The cyst wall was suture  ligated and hemostasis noted.  With each new area of loculation identified- copious suction/irrigation was completed.  Bilateral round ligaments were noted; however due to the density of the adhesions both anterior and laterally, there was difficulty with further dissection anteriorly.  Attention, was turned back to the adnexal/uterine/bowel adhesions posteriorly since based on imaging the areas of loculation were all posterior.  Further blunt dissection was completed near right adnexa.  Due to the density of the adhesions, a small uterine tear was encountered and repaired with 0-vicryl in a running fashion.  With continued exploration and separation of adhesions, a small area of bowel was noted to be denuded near the posterior cul-de-sac.  General surgery was notified- see separate operative note. Once majority of large loculations were separated, irrigation was again completed.  Hemostasis was noted.  JP drain placed in the usual fashion within the left and right posterior gutter.     Retractors were removed.  Loop PDS was used in a running fashion for mass closure of fascia and peritoneum.  The subcutaneous layer was closed using 3-0 vicryl in a running fashion.  The skin was closed with staples.   50cc Exparel /Lidocaine were injected within the subcutaneous area.   The patient was awakened from anesthesia taken to the recovery room in good stable condition. All sponge instrument and needle counts were correct x 3.  Experienced assistants were required to be given the standard of surgical care given the omplexity of the  case.  The assistants were needed for exposure, dissection, suctioning, retraction, instrument exchange, and for overall help during the procedure.   Lauryl Seyer, DO Attending Obstetrician & Gynecologist, [facility] for [employer], [facility]

## 2023-12-08 NOTE — Anesthesia Preprocedure Evaluation (Addendum)
 Anesthesia Evaluation  Patient identified by MRN, date of birth, ID band Patient awake    Reviewed: Allergy & Precautions, NPO status , Patient's Chart, lab work & pertinent test results  History of Anesthesia Complications Negative for: history of anesthetic complications  Airway Mallampati: III  TM Distance: >3 FB Neck ROM: Full  Mouth opening: Limited Mouth Opening  Dental  (+) Dental Advisory Given   Pulmonary neg shortness of breath, sleep apnea (non-compliant with CPAP) , neg COPD, neg recent URI, former smoker Obesity hypoventilation syndrome   Pulmonary exam normal breath sounds clear to auscultation       Cardiovascular hypertension,  Rhythm:Regular Rate:Normal  HLD   Neuro/Psych  Headaches, neg Seizures PSYCHIATRIC DISORDERS Anxiety Depression       GI/Hepatic Neg liver ROS,GERD  ,,  Endo/Other  diabetes, Type 2  Class 4 obesity  Renal/GU ARFRenal disease     Musculoskeletal   Abdominal  (+) + obese  Peds  Hematology  (+) Blood dyscrasia, anemia Lab Results      Component                Value               Date                      WBC                      18.3 (H)            12/08/2023                HGB                      10.2 (L)            12/08/2023                HCT                      31.4 (L)            12/08/2023                MCV                      92.9                12/08/2023                PLT                      254                 12/08/2023              Anesthesia Other Findings 33 y.o. G1P0101 admitted for urosepsis and found to have 20 cm pelvic mass concerning for TOA that is not able to be drained via IR. She has been on IV ABX and has not had symptomatic improvement in her abdominal pain.    Reproductive/Obstetrics                              Anesthesia Physical Anesthesia Plan  ASA: 3  Anesthesia Plan: General   Post-op Pain Management:  Tylenol  PO (pre-op)*, Dilaudid  IV and Ketamine IV*   Induction: Intravenous and  Rapid sequence  PONV Risk Score and Plan: 3 and Ondansetron , Dexamethasone , Midazolam and Treatment may vary due to age or medical condition  Airway Management Planned: Oral ETT and Video Laryngoscope Planned  Additional Equipment:   Intra-op Plan:   Post-operative Plan: Extubation in OR  Informed Consent: I have reviewed the patients History and Physical, chart, labs and discussed the procedure including the risks, benefits and alternatives for the proposed anesthesia with the patient or authorized representative who has indicated his/her understanding and acceptance.     Dental advisory given  Plan Discussed with: CRNA and Anesthesiologist  Anesthesia Plan Comments: (Risks of general anesthesia discussed including, but not limited to, sore throat, hoarse voice, chipped/damaged teeth, injury to vocal cords, nausea and vomiting, allergic reactions, lung infection, heart attack, stroke, and death. All questions answered. )         Anesthesia Quick Evaluation

## 2023-12-08 NOTE — Progress Notes (Signed)
 Progress Note   Patient: Misty Walsh FMW:[id number] DOB: 1991/01/09 DOA: 12/01/2023  DOS: the patient was seen and examined on 12/08/2023   Brief hospital course:  33 year old woman PMH including diabetes, presented with abdominal pain nausea and dry heaving, p/w sepsis and huge suspected tubo-ovarian abscess.  Transferred from [facility] to [facility].   Assessment and Plan:   Concern for sepsis - Fever, leukocytosis, noted large abscess given concern for sepsis.  Blood cultures, IV fluid bolus, empiric broad-spectrum antibiotics on board.  Monitoring leukocytosis and blood pressure closely.   Suspected large tubo-ovarian abscess - MRI noting 22.4 cm complex cystic mass centered in the right adnexa suspicious for abscess.  OB/Gyn and IR following closely.  Evaluation by IR stating masses too deep and too complex for simple IR drainage.  OB/Gyn reaching out to Gen surg for impending surgery today.  Leukocytosis show only minimal improvement, WBC 18.3.  Continues on broad-spectrum empiric antibiotics with ceftriaxone , Doxy, Flagyl .   Hypokalemia - Slightly worse this morning.  Patient not tolerating p.o. so we will order another IV KCl replenishment.  IV fluids changed to half NS with 20 of K.  Will recheck BMP and magnesium  in AM.   Acute kidney injury - Creatinine showing improvement this morning.  IV fluid hydration on board.  Urine output excellent.  Foley in place.  Some suspicion of possible obstruction given size and location of abscess above.  Continue to monitor renal function closely.  Will monitor urine output and recheck kidney function in AM.   Diabetes mellitus - A1c 6.3 suggesting excellent control at home.  Insulin  sliding scale ordered.  P.o. intake poor.   Class III obesity - BMI greater than 51.  Encourage lifestyle modifications after hospitalization.   Subjective: Patient resting comfortably this morning.  Had multiple episodes of emesis.  Still having mild  abdominal pain but otherwise under control.  Fever 100.6 noted.  Denies any shortness of breath, chest pain, diarrhea.  Anxious for impending surgery.  Physical Exam:  Vitals:   12/07/23 2156 12/08/23 0000 12/08/23 0329 12/08/23 0822  BP:  (!) 141/91 (!) 144/78 (!) 149/79  Pulse:  100 (!) 106 (!) 101  Resp:  18 18 20   Temp: 99.6 F (37.6 C)  98 F (36.7 C) 99.1 F (37.3 C)  TempSrc: Oral  Oral Oral  SpO2:  96%  94%  Weight:      Height:        GENERAL:  obese, pleasant, mild acute distress, malaise HEENT:  EOMI CARDIOVASCULAR:  RRR, no murmurs appreciated RESPIRATORY:  Clear to auscultation, no wheezing, rales, or rhonchi GASTROINTESTINAL:  Soft, diffusely mildly tender EXTREMITIES:  No LE edema bilaterally NEURO:  No new focal deficits appreciated SKIN:  No rashes noted PSYCH:  Appropriate mood and affect, anxious   Data Reviewed:  Imaging Studies: DG Chest Port 1 View Result Date: 12/03/2023 EXAM: 1 VIEW(S) XRAY OF THE CHEST 12/03/2023 08:34:00 AM COMPARISON: 12/01/2023 CLINICAL HISTORY: Dyspnea FINDINGS: LUNGS AND PLEURA: Poor inspiratory effort. No focal infiltrate is seen. Left basilar atelectasis has resolved. HEART AND MEDIASTINUM: No acute abnormality of the cardiac and mediastinal silhouettes. BONES AND SOFT TISSUES: No acute osseous abnormality. IMPRESSION: 1. Resolution of prior left basilar atelectasis. Electronically signed by: Oneil Devonshire MD 12/03/2023 11:45 AM EDT RP Workstation: [id number]   MR PELVIS W WO CONTRAST Result Date: 12/02/2023 CLINICAL DATA:  Pelvic pain, nausea, and vomiting. Pelvic mass on recent CT. EXAM: MRI PELVIS WITHOUT AND WITH CONTRAST TECHNIQUE:  Multiplanar multisequence MR imaging of the pelvis was performed both before and after administration of intravenous contrast. CONTRAST:  10mL GADAVIST GADOBUTROL 1 MMOL/ML IV SOLN COMPARISON:  CT on 12/01/2023 FINDINGS: Lower Urinary Tract: Bladder is displaced anteriorly by large cystic mass in  central pelvis, however, no intrinsic bladder abnormality identified. Bowel: Unremarkable pelvic bowel loops. Vascular/Lymphatic: Unremarkable. No pathologically enlarged pelvic lymph nodes identified. Reproductive: -- Uterus: Measures 11.2 x 3.5 by 5.5 cm. Displaced anteriorly and to the left by large cystic mass in central pelvis. No otherwise unremarkable in appearance. -- Right ovary: No normal ovary visualized. A complex cystic lesion is seen which is centered in the right adnexa, displacing the rectum and uterus to the left. This lesion contains multiple internal cystic components with T1 hypointense fluid and peripheral enhancement, some of which appear tubular in shape. An internal air-fluid level is also noted. There is surrounding edema and tiny amount of free fluid. This measures 22.4 by 8.0 by 9.8 cm. This is highly suspicious for a large tubo-ovarian abscess, with cystic ovarian neoplasm and endometriosis considered less likely. -- Left ovary:  No normal ovary visualized. Other: Tiny amount of free fluid seen in intraperitoneal and presacral spaces. Musculoskeletal:  Unremarkable. IMPRESSION: 22.4 cm complex cystic mass centered in right adnexa. Features are highly suspicious for a large tubo-ovarian abscess, with cystic ovarian neoplasm and endometriosis considered less likely. Electronically Signed   By: Norleen DELENA Kil M.D.   On: 12/02/2023 16:13   CT ABDOMEN PELVIS W CONTRAST Result Date: 12/01/2023 EXAM: CT ABDOMEN AND PELVIS WITH CONTRAST 12/01/2023 04:32:27 PM TECHNIQUE: CT of the abdomen and pelvis was performed with the administration of 100 mL of iohexol  (OMNIPAQUE ) 300 MG/ML solution. Multiplanar reformatted images are provided for review. Automated exposure control, iterative reconstruction, and/or weight-based adjustment of the mA/kV was utilized to reduce the radiation dose to as low as reasonably achievable. COMPARISON: Comparison with 02/13/2022 and ultrasound 08/14/2022. CLINICAL  HISTORY: N/V. Eval for pyelonephritis vs nephrolithiasis. Triage Notes: Patient BIB [facility] from home. Abdominal pain for 5 days, nausea and vomiting. 3 days ago prescribed antibiotic for UTI but trouble keeping the medication down. Said her urine culture came back positive today. Had covid 1 week ago. FINDINGS: LOWER CHEST: No acute abnormality. LIVER: Hepatic steatosis. GALLBLADDER AND BILE DUCTS: Gallbladder is unremarkable. No biliary ductal dilatation. SPLEEN: No acute abnormality. PANCREAS: No acute abnormality. ADRENAL GLANDS: No acute abnormality. KIDNEYS, URETERS AND BLADDER: No stones in the kidneys or ureters. No hydronephrosis. No perinephric or periureteral stranding. Urinary bladder is unremarkable. GI AND BOWEL: Stomach demonstrates no acute abnormality. There is no bowel obstruction. Normal appendix. PERITONEUM AND RETROPERITONEUM: No ascites. No free air. VASCULATURE: Aorta is normal in caliber. LYMPH NODES: No lymphadenopathy. REPRODUCTIVE ORGANS: Right hydrosalpinx has increased compared to 02/13/2022. Comparison with ultrasound 08/14/2022 is limited due to differences in technique. Complex right ovarian cystic lesion has increased from the prior CT and measures 6.1 x 5.2 cm. There is a new low-density cystic lesion measuring 7.2 x 5.3 x 7.7 cm. The lesion is superior to the uterus and bladder. The origin is unclear. This may arise from the complex right paraovarian cyst or the left ovary. BONES AND SOFT TISSUES: No acute osseous abnormality. No focal soft tissue abnormality. IMPRESSION: 1. New low-density cystic lesion measuring 7.7 cm, superior to the uterus and bladder, with unclear origin; may arise from the complex right paraovarian cyst or the left ovary. 2. Complex right ovarian cystic lesion has increased from the prior  CT and measures 6.1 cm. 3. Right hydrosalpinx has increased compared to prior CT. 4. Recommend gynecologic consultation and/or MRI w/ IV contrast for further characterization  . Electronically signed by: Norman Gatlin MD 12/01/2023 05:20 PM EDT RP Workstation: [id number]   DG Chest Portable 1 View Result Date: 12/01/2023 CLINICAL DATA:  Shortness of breath, sepsis EXAM: PORTABLE CHEST 1 VIEW COMPARISON:  09/19/2016 FINDINGS: Low lung volumes are present, causing crowding of the pulmonary vasculature. Bandlike opacity just above the left hemidiaphragm probably representing atelectasis in the left lower lobe. Hazy density along the right hemithorax likely mostly attributable to soft tissues of the chest wall and the underlying low lung volumes, no discrete airspace opacity identified on the right. No blunting the costophrenic angles. No significant bony findings. IMPRESSION: 1. Low lung volumes are present, causing crowding of the pulmonary vasculature. 2. Bandlike opacity just above the left hemidiaphragm probably representing atelectasis in the left lower lobe. Electronically Signed   By: Ryan Salvage M.D.   On: 12/01/2023 13:44    There are no new results to review at this time.  Previous records (including but not limited to H&P, progress notes, nursing notes, TOC management) were reviewed in assessment of this patient.  Labs: CBC: Recent Labs  Lab 12/01/23 1312 12/03/23 0357 12/04/23 0351 12/05/23 0730 12/06/23 0418 12/07/23 0426 12/08/23 0436  WBC 21.7*   < > 21.1* 21.9* 20.6* 17.6* 18.3*  NEUTROABS 19.2*  --   --   --   --   --   --   HGB 12.7   < > 10.0* 10.6* 10.4* 9.8* 10.2*  HCT 39.5   < > 31.2* 31.4* 31.9* 29.8* 31.4*  MCV 93.8   < > 95.7 91.8 93.3 94.3 92.9  PLT 262   < > 261 306 343 338 254   < > = values in this interval not displayed.   Basic Metabolic Panel: Recent Labs  Lab 12/02/23 0003 12/03/23 0357 12/05/23 0730 12/06/23 0418 12/07/23 0426 12/07/23 1356 12/08/23 0436  NA  --    < > 135 137 137 136 136  K  --    < > 2.8* 3.0* 2.9* 3.2* 3.1*  CL  --    < > 97* 98 99 99 96*  CO2  --    < > 19* 23 25 24 27   GLUCOSE  --    < >  147* 152* 138* 144* 156*  BUN  --    < > 9 9 7 7 6   CREATININE  --    < > 1.30* 1.30* 1.10* 1.06* 1.14*  CALCIUM   --    < > 8.7* 8.7* 8.5* 8.6* 8.8*  MG 2.2  --  2.0 2.3 1.9  --  1.9   < > = values in this interval not displayed.   Liver Function Tests: Recent Labs  Lab 12/01/23 1312 12/05/23 0730 12/06/23 0418 12/07/23 0426 12/08/23 0436  AST 26 36 46* 37 32  ALT 29 27 28 24 22   ALKPHOS 93 75 84 66 67  BILITOT 0.8 0.6 0.7 0.5 0.6  PROT 8.1 7.0 7.1 6.4* 6.9  ALBUMIN 3.8 2.4* 2.4* 2.1* 2.1*   CBG: Recent Labs  Lab 12/07/23 0518 12/07/23 1224 12/07/23 1721 12/07/23 2155 12/08/23 0859  GLUCAP 145* 148* 121* 164* 164*    Scheduled Meds:  acetaminophen   1,000 mg Oral On Call to OR   atorvastatin  80 mg Oral Daily   chlorthalidone   25 mg Oral Daily  insulin  aspart  0-15 Units Subcutaneous TID WC   pantoprazole   40 mg Oral Daily   potassium chloride   20 mEq Oral BID   scopolamine   1 patch Transdermal Q72H   sodium citrate -citric acid   30 mL Oral On Call to OR   Continuous Infusions:  0.45 % NaCl with KCl 20 mEq / L 50 mL/hr at 12/07/23 1257   cefOXitin     cefTRIAXone  (ROCEPHIN )  IV 2 g (12/07/23 2015)   doxycycline  (VIBRAMYCIN ) IV 100 mg (12/08/23 0555)   metronidazole  500 mg (12/08/23 0820)   potassium chloride  10 mEq (12/08/23 1137)   PRN Meds:.alum & mag hydroxide-simeth, hydrALAZINE, HYDROmorphone  (DILAUDID ) injection, loperamide, ondansetron  (ZOFRAN ) IV, mouth rinse, oxyCODONE , prochlorperazine , zolpidem   Family Communication: None at bedside  Disposition: Status is: Inpatient Remains inpatient appropriate because: Sepsis     Time spent: 40 minutes  Length of inpatient stay: 7 days  Author: Carliss LELON Canales, DO 12/08/2023 11:44 AM  For on call review [email or web address].

## 2023-12-08 NOTE — Transfer of Care (Signed)
 Immediate Anesthesia Transfer of Care Note  Patient: Misty Walsh  Procedure(s) Performed: HYSTERECTOMY, ABDOMINAL  Patient Location: PACU  Anesthesia Type:General  Level of Consciousness: drowsy  Airway & Oxygen Therapy: Patient Spontanous Breathing and Patient connected to face mask oxygen  Post-op Assessment: Report given to RN and Post -op Vital signs reviewed and stable  Post vital signs: Reviewed and stable  Last Vitals:  Vitals Value Taken Time  BP 142/69 12/08/23 16:45  Temp    Pulse 107 12/08/23 16:48  Resp 35 12/08/23 16:48  SpO2 95 % 12/08/23 16:48  Vitals shown include unfiled device data.  Last Pain:  Vitals:   12/08/23 1251  TempSrc:   PainSc: 6       Patients Stated Pain Goal: 2 (12/06/23 2004)  Complications: No notable events documented.

## 2023-12-08 NOTE — Plan of Care (Signed)
  Problem: Education: Goal: Ability to describe self-care measures that may prevent or decrease complications (Diabetes Survival Skills Education) will improve 12/08/2023 0707 by Monaca Wadas, RN Outcome: Progressing 12/08/2023 0706 by Marca Morse, RN Outcome: Progressing Goal: Individualized Educational Video(s) 12/08/2023 0707 by Marca Morse, RN Outcome: Progressing 12/08/2023 0706 by Marca Morse, RN Outcome: Progressing   Problem: Education: Goal: Individualized Educational Video(s) 12/08/2023 0707 by Marca Morse, RN Outcome: Progressing 12/08/2023 0706 by Marca Morse, RN Outcome: Progressing   Problem: Coping: Goal: Ability to adjust to condition or change in health will improve Outcome: Progressing   Problem: Fluid Volume: Goal: Ability to maintain a balanced intake and output will improve 12/08/2023 0707 by Marca Morse, RN Outcome: Progressing 12/08/2023 0706 by Marca Morse, RN Outcome: Progressing   Problem: Fluid Volume: Goal: Ability to maintain a balanced intake and output will improve 12/08/2023 0707 by Marca Morse, RN Outcome: Progressing 12/08/2023 0706 by Marca Morse, RN Outcome: Progressing   Problem: Health Behavior/Discharge Planning: Goal: Ability to identify and utilize available resources and services will improve 12/08/2023 0707 by Marca Morse, RN Outcome: Progressing 12/08/2023 0706 by Marca Morse, RN Outcome: Progressing Goal: Ability to manage health-related needs will improve Outcome: Progressing

## 2023-12-08 NOTE — Progress Notes (Signed)
 IV team to bedside for PIV placement. Original request was for an 18g.  Vessel occupancy would have been greater than 45% therefore a 20g 1.88in placed at 34% occupancy using US .

## 2023-12-08 NOTE — Anesthesia Procedure Notes (Signed)
 Procedure Name: Intubation Date/Time: 12/08/2023 2:17 PM  Performed by: Mannie Krystal LABOR, CRNAPre-anesthesia Checklist: Patient identified, Emergency Drugs available, Suction available and Patient being monitored Patient Re-evaluated:Patient Re-evaluated prior to induction Oxygen Delivery Method: Circle system utilized Preoxygenation: Pre-oxygenation with 100% oxygen Induction Type: IV induction Ventilation: Mask ventilation without difficulty Laryngoscope Size: McGrath and 3 Grade View: Grade I Tube type: Oral Tube size: 7.5 mm Number of attempts: 1 Airway Equipment and Method: Stylet, Oral airway and Video-laryngoscopy Placement Confirmation: ETT inserted through vocal cords under direct vision, positive ETCO2 and breath sounds checked- equal and bilateral Secured at: 23 cm Tube secured with: Tape Dental Injury: Teeth and Oropharynx as per pre-operative assessment  Difficulty Due To: Difficulty was anticipated, Difficult Airway- due to large tongue and Difficult Airway- due to limited oral opening Future Recommendations: Recommend- induction with short-acting agent, and alternative techniques readily available

## 2023-12-08 NOTE — Progress Notes (Signed)
 [facility] OB/GYN Attending Progress Note  ADMISSION DIAGNOSIS:   Principal Problem:   Sepsis due to gram-negative UTI [facility]) Active Problems:   Type 2 diabetes mellitus without complication, without long-term current use of insulin  (HCC)   Tubo-ovarian abscess   Acute pyelonephritis   BMI 50.0-59.9, adult (HCC)   Obesity, class 3 (HCC)   Subjective  Patient resting comfortably in bed.  She notes diffuse discomfort.  Some nausea, no vomiting.  Currently NPO.  Foley cath in place.  No subjective fevers or chills.   Objective  VITALS:  height is 5' 4 (1.626 m) and weight is 136.1 kg. Her oral temperature is 99.1 F (37.3 C). Her blood pressure is 149/79 (abnormal) and her pulse is 101 (abnormal). Her respiration is 20 and oxygen saturation is 94%.   EXAMINATION: CONSTITUTIONAL: No acute distress SKIN: Skin is warm and dry.. Not diaphoretic. No erythema. No pallor. NEUROLOGIC: Alert and oriented to person, place, and time. PSYCHIATRIC: Normal mood and affect. Normal behavior. Normal judgment and thought content. CARDIOVASCULAR: Normal heart rate noted RESPIRATORY: Effort and breath sounds normal, no problems with respiration noted.  CTAB MUSCULOSKELETAL: Normal range of motion. No edema and no pain with movement ABDOMEN: obese, soft, diffusely tender, no rebound or guarding.     Laboratory Reports: Results for orders placed or performed during the hospital encounter of 12/01/23 (from the past 72 hours)  Glucose, capillary     Status: Abnormal   Collection Time: 12/05/23 12:14 PM  Result Value Ref Range   Glucose-Capillary 141 (H) 70 - 99 mg/dL    Comment: Glucose reference range applies only to samples taken after fasting for at least 8 hours.  Glucose, capillary     Status: Abnormal   Collection Time: 12/05/23  4:22 PM  Result Value Ref Range   Glucose-Capillary 123 (H) 70 - 99 mg/dL    Comment: Glucose reference range applies only to samples  taken after fasting for at least 8 hours.  CBC     Status: Abnormal   Collection Time: 12/06/23  4:18 AM  Result Value Ref Range   WBC 20.6 (H) 4.0 - 10.5 K/uL   RBC 3.42 (L) 3.87 - 5.11 MIL/uL   Hemoglobin 10.4 (L) 12.0 - 15.0 g/dL   HCT 68.0 (L) 63.9 - 53.9 %   MCV 93.3 80.0 - 100.0 fL   MCH 30.4 26.0 - 34.0 pg   MCHC 32.6 30.0 - 36.0 g/dL   RDW 86.6 88.4 - 84.4 %   Platelets 343 150 - 400 K/uL   nRBC 0.0 0.0 - 0.2 %    Comment: Performed at [facility], [address] [place], [place] [address]  Comprehensive metabolic panel with GFR     Status: Abnormal   Collection Time: 12/06/23  4:18 AM  Result Value Ref Range   Sodium 137 135 - 145 mmol/L   Potassium 3.0 (L) 3.5 - 5.1 mmol/L   Chloride 98 98 - 111 mmol/L   CO2 23 22 - 32 mmol/L   Glucose, Bld 152 (H) 70 - 99 mg/dL    Comment: Glucose reference range applies only to samples taken after fasting for at least 8 hours.   BUN 9 6 - 20 mg/dL   Creatinine, Ser 8.69 (H) 0.44 - 1.00 mg/dL   Calcium  8.7 (L) 8.9 - 10.3 mg/dL   Total Protein 7.1 6.5 - 8.1 g/dL   Albumin 2.4 (L)  3.5 - 5.0 g/dL   AST 46 (H) 15 - 41 U/L   ALT 28 0 - 44 U/L   Alkaline Phosphatase 84 38 - 126 U/L   Total Bilirubin 0.7 0.0 - 1.2 mg/dL   GFR, Estimated 55 (L) >60 mL/min    Comment: (NOTE) Calculated using the CKD-EPI Creatinine Equation (2021)    Anion gap 16 (H) 5 - 15    Comment: Performed at [facility], [address] [place], [place] [address]  Magnesium      Status: None   Collection Time: 12/06/23  4:18 AM  Result Value Ref Range   Magnesium  2.3 1.7 - 2.4 mg/dL    Comment: Performed at [facility], [address], [place], [place] [address]  Glucose, capillary     Status: Abnormal   Collection Time: 12/06/23  8:17 AM  Result Value Ref Range   Glucose-Capillary 165 (H) 70 - 99 mg/dL    Comment: Glucose reference range applies only to samples taken after fasting for at least 8 hours.  Glucose, capillary     Status:  Abnormal   Collection Time: 12/06/23 11:58 AM  Result Value Ref Range   Glucose-Capillary 152 (H) 70 - 99 mg/dL    Comment: Glucose reference range applies only to samples taken after fasting for at least 8 hours.  Glucose, capillary     Status: Abnormal   Collection Time: 12/06/23  5:26 PM  Result Value Ref Range   Glucose-Capillary 172 (H) 70 - 99 mg/dL    Comment: Glucose reference range applies only to samples taken after fasting for at least 8 hours.  Glucose, capillary     Status: Abnormal   Collection Time: 12/07/23  1:56 AM  Result Value Ref Range   Glucose-Capillary 133 (H) 70 - 99 mg/dL    Comment: Glucose reference range applies only to samples taken after fasting for at least 8 hours.  CBC     Status: Abnormal   Collection Time: 12/07/23  4:26 AM  Result Value Ref Range   WBC 17.6 (H) 4.0 - 10.5 K/uL   RBC 3.16 (L) 3.87 - 5.11 MIL/uL   Hemoglobin 9.8 (L) 12.0 - 15.0 g/dL   HCT 70.1 (L) 63.9 - 53.9 %   MCV 94.3 80.0 - 100.0 fL   MCH 31.0 26.0 - 34.0 pg   MCHC 32.9 30.0 - 36.0 g/dL   RDW 86.8 88.4 - 84.4 %   Platelets 338 150 - 400 K/uL   nRBC 0.0 0.0 - 0.2 %    Comment: Performed at [facility], [address], [place], [place] [address]  CK     Status: Abnormal   Collection Time: 12/07/23  4:26 AM  Result Value Ref Range   Total CK 355 (H) 38 - 234 U/L    Comment: Performed at [facility], [address] [place], [place] [address]  Magnesium      Status: None   Collection Time: 12/07/23  4:26 AM  Result Value Ref Range   Magnesium  1.9 1.7 - 2.4 mg/dL    Comment: Performed at [facility], [address], [place], [place] [address]  Comprehensive metabolic panel with GFR     Status: Abnormal   Collection Time: 12/07/23  4:26 AM  Result Value Ref Range   Sodium 137 135 - 145 mmol/L   Potassium 2.9 (L) 3.5 - 5.1 mmol/L   Chloride 99 98 - 111 mmol/L  CO2 25 22 - 32 mmol/L   Glucose, Bld 138 (H) 70 - 99 mg/dL    Comment: Glucose reference  range applies only to samples taken after fasting for at least 8 hours.   BUN 7 6 - 20 mg/dL   Creatinine, Ser 8.89 (H) 0.44 - 1.00 mg/dL   Calcium  8.5 (L) 8.9 - 10.3 mg/dL   Total Protein 6.4 (L) 6.5 - 8.1 g/dL   Albumin 2.1 (L) 3.5 - 5.0 g/dL   AST 37 15 - 41 U/L   ALT 24 0 - 44 U/L   Alkaline Phosphatase 66 38 - 126 U/L   Total Bilirubin 0.5 0.0 - 1.2 mg/dL   GFR, Estimated >39 >39 mL/min    Comment: (NOTE) Calculated using the CKD-EPI Creatinine Equation (2021)    Anion gap 13 5 - 15    Comment: Performed at [facility], [address], [place], [place] [address]  Glucose, capillary     Status: Abnormal   Collection Time: 12/07/23  5:18 AM  Result Value Ref Range   Glucose-Capillary 145 (H) 70 - 99 mg/dL    Comment: Glucose reference range applies only to samples taken after fasting for at least 8 hours.  Glucose, capillary     Status: Abnormal   Collection Time: 12/07/23 12:24 PM  Result Value Ref Range   Glucose-Capillary 148 (H) 70 - 99 mg/dL    Comment: Glucose reference range applies only to samples taken after fasting for at least 8 hours.  Basic metabolic panel     Status: Abnormal   Collection Time: 12/07/23  1:56 PM  Result Value Ref Range   Sodium 136 135 - 145 mmol/L   Potassium 3.2 (L) 3.5 - 5.1 mmol/L   Chloride 99 98 - 111 mmol/L   CO2 24 22 - 32 mmol/L   Glucose, Bld 144 (H) 70 - 99 mg/dL    Comment: Glucose reference range applies only to samples taken after fasting for at least 8 hours.   BUN 7 6 - 20 mg/dL   Creatinine, Ser 8.93 (H) 0.44 - 1.00 mg/dL   Calcium  8.6 (L) 8.9 - 10.3 mg/dL   GFR, Estimated >39 >39 mL/min    Comment: (NOTE) Calculated using the CKD-EPI Creatinine Equation (2021)    Anion gap 13 5 - 15    Comment: Performed at [facility], [address] [place], [place] [address]  Glucose, capillary     Status: Abnormal   Collection Time: 12/07/23  5:21 PM  Result Value Ref Range   Glucose-Capillary 121 (H) 70 - 99 mg/dL     Comment: Glucose reference range applies only to samples taken after fasting for at least 8 hours.  Type and screen [facility]     Status: None   Collection Time: 12/07/23  9:27 PM  Result Value Ref Range   ABO/RH(D) O POS    Antibody Screen NEG    Sample Expiration      12/10/2023,2359 Performed at [facility], [address], [place], [place] [address]   Glucose, capillary     Status: Abnormal   Collection Time: 12/07/23  9:55 PM  Result Value Ref Range   Glucose-Capillary 164 (H) 70 - 99 mg/dL    Comment: Glucose reference range applies only to samples taken after fasting for at least 8 hours.  CBC     Status: Abnormal   Collection Time: 12/08/23  4:36 AM  Result Value  Ref Range   WBC 18.3 (H) 4.0 - 10.5 K/uL   RBC 3.38 (L) 3.87 - 5.11 MIL/uL   Hemoglobin 10.2 (L) 12.0 - 15.0 g/dL   HCT 68.5 (L) 63.9 - 53.9 %   MCV 92.9 80.0 - 100.0 fL   MCH 30.2 26.0 - 34.0 pg   MCHC 32.5 30.0 - 36.0 g/dL   RDW 87.0 88.4 - 84.4 %   Platelets 254 150 - 400 K/uL   nRBC 0.0 0.0 - 0.2 %    Comment: Performed at [facility], [address], [place], [place] [address]  Comprehensive metabolic panel with GFR     Status: Abnormal   Collection Time: 12/08/23  4:36 AM  Result Value Ref Range   Sodium 136 135 - 145 mmol/L   Potassium 3.1 (L) 3.5 - 5.1 mmol/L   Chloride 96 (L) 98 - 111 mmol/L   CO2 27 22 - 32 mmol/L   Glucose, Bld 156 (H) 70 - 99 mg/dL    Comment: Glucose reference range applies only to samples taken after fasting for at least 8 hours.   BUN 6 6 - 20 mg/dL   Creatinine, Ser 8.85 (H) 0.44 - 1.00 mg/dL   Calcium  8.8 (L) 8.9 - 10.3 mg/dL   Total Protein 6.9 6.5 - 8.1 g/dL   Albumin 2.1 (L) 3.5 - 5.0 g/dL   AST 32 15 - 41 U/L   ALT 22 0 - 44 U/L   Alkaline Phosphatase 67 38 - 126 U/L   Total Bilirubin 0.6 0.0 - 1.2 mg/dL   GFR, Estimated >39 >39 mL/min    Comment: (NOTE) Calculated using the CKD-EPI Creatinine Equation (2021)    Anion gap 13  5 - 15    Comment: Performed at [facility], [address], [place], [place] [address]  Protime-INR     Status: Abnormal   Collection Time: 12/08/23  4:36 AM  Result Value Ref Range   Prothrombin Time 16.3 (H) 11.4 - 15.2 seconds   INR 1.2 0.8 - 1.2    Comment: (NOTE) INR goal varies based on device and disease states. Performed at [facility], [address] [place], [place] [address]   Magnesium      Status: None   Collection Time: 12/08/23  4:36 AM  Result Value Ref Range   Magnesium  1.9 1.7 - 2.4 mg/dL    Comment: Performed at [facility], [address] [place], [place] [address]  Glucose, capillary     Status: Abnormal   Collection Time: 12/08/23  8:59 AM  Result Value Ref Range   Glucose-Capillary 164 (H) 70 - 99 mg/dL    Comment: Glucose reference range applies only to samples taken after fasting for at least 8 hours.     ASSESSMENT Sepsis due to large TOA Morbid obesity Chronic HTN Type 2 DM  PLAN: -GYN: Due to large TOA and inability to complete IR drainage- concern for sub-optimal treatment, which is evidence by unchanged leukocytosis - Scheduled for surgery later today- reviewed plan for ExLap with possibility of TAH, USO.  Reviewed risk and benefit including but not limited to risk of bleeding, infection, injury to surrounding organs requiring further surgical intervention.  Please see prior note from Dr. Cleatus (10/17).  General surgery has also been notified.  Inform consent has been completed  -Continue IV antibiotics- currently on Rocephin , Doxy/Flagyl  -FEN- currently NPO, IV K ordered -IM on board- following FEN, HTN and DM management  DVT Prophylaxis:  Lovenox on hold due to upcoming surgery Code Status: Full  Seydina Holliman, DO Attending Obstetrician & Gynecologist, [facility] for [employer], [facility]

## 2023-12-08 NOTE — Op Note (Signed)
 Preoperative diagnosis: Tubo-ovarian abscess/pelvic abscess   postoperative diagnosis: Same  Procedure: Intraoperative consultation/exploratory laparotomy for evaluation of rectosigmoid junction during laparotomy for drainage of tubo-ovarian abscess  Surgeon: Debby Shipper, MD  Anesthesia: General  Indications for procedure: I was consulted intraoperatively for evaluation of the rectosigmoid junction during exporter laparotomy for tubo-ovarian abscess.  The patient had a complex tubo-ovarian abscess who was admitted over the weekend for IV antibiotics.  She is brought to the OR today for drainage since she was not an IR candidate and was not improving on medical management.  Description of procedure: I was consulted intraoperatively for evaluation.  The patient was already asleep within retractor in place in the pelvis.  Upon evaluation I was asked to evaluate the sigmoid colon.  She had multiple abscesses that were drained by gynecology.  The rectosigmoid junction was visualized.  The rectosigmoid junction appeared intact with no evidence of serosal or mucosal injury.  There is significant severe inflammation throughout.  The neighboring small bowel appeared grossly normal but she also had significant lesions from inflammation.  There is no signs of any succus or leakage of stool.  I evaluated the rectosigmoid junction.  I did place 2 sutures of 3-0 Vicryl to help bring some of the loose fatty tissue over the colon.  There is no mucosal violation that I could see.  Saline was placed in the pelvis and there is no evidence of leakage or air bubbles from this area whatsoever.  This point in time I saw no convincing evidence of injury at the rectosigmoid junction.  I saw no other injury or bowel injury otherwise with a laparotomy performed.  The attending surgeon and assistant completed the case.  Please see their operative note for any additional details.

## 2023-12-08 NOTE — Plan of Care (Signed)
  Problem: Fluid Volume: Goal: Ability to maintain a balanced intake and output will improve Outcome: Progressing   Problem: Education: Goal: Ability to describe self-care measures that may prevent or decrease complications (Diabetes Survival Skills Education) will improve Outcome: Progressing   Problem: Education: Goal: Individualized Educational Video(s) Outcome: Progressing   Problem: Health Behavior/Discharge Planning: Goal: Ability to identify and utilize available resources and services will improve Outcome: Progressing   Problem: Health Behavior/Discharge Planning: Goal: Ability to manage health-related needs will improve Outcome: Progressing

## 2023-12-09 ENCOUNTER — Inpatient Hospital Stay (HOSPITAL_COMMUNITY): Payer: MEDICAID

## 2023-12-09 ENCOUNTER — Encounter (HOSPITAL_COMMUNITY): Payer: Self-pay | Admitting: Obstetrics & Gynecology

## 2023-12-09 DIAGNOSIS — N179 Acute kidney failure, unspecified: Secondary | ICD-10-CM

## 2023-12-09 DIAGNOSIS — Z9889 Other specified postprocedural states: Secondary | ICD-10-CM

## 2023-12-09 DIAGNOSIS — J9601 Acute respiratory failure with hypoxia: Secondary | ICD-10-CM

## 2023-12-09 DIAGNOSIS — A419 Sepsis, unspecified organism: Secondary | ICD-10-CM

## 2023-12-09 LAB — CBC
HCT: 31.6 % — ABNORMAL LOW (ref 36.0–46.0)
Hemoglobin: 10.4 g/dL — ABNORMAL LOW (ref 12.0–15.0)
MCH: 30.5 pg (ref 26.0–34.0)
MCHC: 32.9 g/dL (ref 30.0–36.0)
MCV: 92.7 fL (ref 80.0–100.0)
Platelets: 404 K/uL — ABNORMAL HIGH (ref 150–400)
RBC: 3.41 MIL/uL — ABNORMAL LOW (ref 3.87–5.11)
RDW: 12.8 % (ref 11.5–15.5)
WBC: 32.3 K/uL — ABNORMAL HIGH (ref 4.0–10.5)
nRBC: 0 % (ref 0.0–0.2)

## 2023-12-09 LAB — COMPREHENSIVE METABOLIC PANEL WITH GFR
ALT: 20 U/L (ref 0–44)
AST: 33 U/L (ref 15–41)
Albumin: 1.9 g/dL — ABNORMAL LOW (ref 3.5–5.0)
Alkaline Phosphatase: 58 U/L (ref 38–126)
Anion gap: 11 (ref 5–15)
BUN: 10 mg/dL (ref 6–20)
CO2: 23 mmol/L (ref 22–32)
Calcium: 8.2 mg/dL — ABNORMAL LOW (ref 8.9–10.3)
Chloride: 99 mmol/L (ref 98–111)
Creatinine, Ser: 1.15 mg/dL — ABNORMAL HIGH (ref 0.44–1.00)
GFR, Estimated: >60 mL/min (ref >60)
Glucose, Bld: 231 mg/dL — ABNORMAL HIGH (ref 70–99)
Potassium: 3.9 mmol/L (ref 3.5–5.1)
Sodium: 133 mmol/L — ABNORMAL LOW (ref 135–145)
Total Bilirubin: 0.6 mg/dL (ref 0.0–1.2)
Total Protein: 6.4 g/dL — ABNORMAL LOW (ref 6.5–8.1)

## 2023-12-09 LAB — MAGNESIUM: Magnesium: 1.7 mg/dL (ref 1.7–2.4)

## 2023-12-09 LAB — GLUCOSE, CAPILLARY
Glucose-Capillary: 203 mg/dL — ABNORMAL HIGH (ref 70–99)
Glucose-Capillary: 208 mg/dL — ABNORMAL HIGH (ref 70–99)
Glucose-Capillary: 164 mg/dL — ABNORMAL HIGH (ref 70–99)

## 2023-12-09 MED ORDER — INSULIN GLARGINE-YFGN 100 UNIT/ML ~~LOC~~ SOLN
6.0000 [IU] | Freq: Every day | SUBCUTANEOUS | Status: DC
  Administered 2023-12-09 – 2023-12-19 (×11): 6 [IU] via SUBCUTANEOUS
  Filled 2023-12-09 (×12): qty 0.06

## 2023-12-09 MED ORDER — GABAPENTIN 300 MG PO CAPS
300.0000 mg | ORAL_CAPSULE | Freq: Three times a day (TID) | ORAL | Status: DC
Start: 2023-12-09 — End: 2023-12-23
  Administered 2023-12-09 – 2023-12-23 (×34): 300 mg via ORAL
  Filled 2023-12-09 (×39): qty 1

## 2023-12-09 MED ORDER — DIPHENHYDRAMINE HCL 50 MG/ML IJ SOLN
50.0000 mg | Freq: Three times a day (TID) | INTRAMUSCULAR | Status: DC | PRN
  Administered 2023-12-09 – 2023-12-18 (×2): 50 mg via INTRAVENOUS
  Filled 2023-12-09 (×2): qty 1

## 2023-12-09 MED ORDER — METOCLOPRAMIDE HCL 5 MG/ML IJ SOLN
5.0000 mg | Freq: Three times a day (TID) | INTRAMUSCULAR | Status: DC
  Administered 2023-12-09 – 2023-12-11 (×6): 5 mg via INTRAVENOUS
  Filled 2023-12-09 (×6): qty 2

## 2023-12-09 MED ORDER — HEPARIN BOLUS VIA INFUSION
5500.0000 [IU] | Freq: Once | INTRAVENOUS | Status: AC
  Administered 2023-12-09: 5500 [IU] via INTRAVENOUS
  Filled 2023-12-09: qty 5500

## 2023-12-09 MED ORDER — KETOROLAC TROMETHAMINE 30 MG/ML IJ SOLN
15.0000 mg | Freq: Three times a day (TID) | INTRAMUSCULAR | Status: AC
Start: 2023-12-09 — End: 2023-12-10
  Administered 2023-12-09 – 2023-12-10 (×3): 15 mg via INTRAVENOUS
  Filled 2023-12-09 (×3): qty 1

## 2023-12-09 MED ORDER — SODIUM CHLORIDE 0.9 % IV SOLN
12.5000 mg | Freq: Four times a day (QID) | INTRAVENOUS | Status: DC | PRN
  Administered 2023-12-09 – 2023-12-12 (×5): 12.5 mg via INTRAVENOUS
  Filled 2023-12-09: qty 0.5
  Filled 2023-12-09 (×2): qty 12.5
  Filled 2023-12-09 (×2): qty 0.5
  Filled 2023-12-09: qty 12.5
  Filled 2023-12-09 (×2): qty 0.5

## 2023-12-09 MED ORDER — ACETAMINOPHEN 325 MG PO TABS
650.0000 mg | ORAL_TABLET | Freq: Four times a day (QID) | ORAL | Status: DC
Start: 2023-12-09 — End: 2023-12-20
  Administered 2023-12-09 – 2023-12-20 (×24): 650 mg via ORAL
  Filled 2023-12-09 (×34): qty 2

## 2023-12-09 MED ORDER — ENOXAPARIN SODIUM 80 MG/0.8ML IJ SOSY
70.0000 mg | PREFILLED_SYRINGE | Freq: Every day | INTRAMUSCULAR | Status: DC
  Administered 2023-12-09: 70 mg via SUBCUTANEOUS
  Filled 2023-12-09: qty 0.8

## 2023-12-09 MED ORDER — INSULIN GLARGINE 100 UNITS/ML SOLOSTAR PEN: 6.0000 [IU] | PEN_INJECTOR | SUBCUTANEOUS | Status: DC

## 2023-12-09 MED ORDER — INSULIN ASPART 100 UNIT/ML IJ SOLN
0.0000 [IU] | Freq: Three times a day (TID) | INTRAMUSCULAR | Status: DC
  Administered 2023-12-09: 17 [IU] via SUBCUTANEOUS
  Administered 2023-12-10 – 2023-12-11 (×2): 4 [IU] via SUBCUTANEOUS
  Administered 2023-12-12: 7 [IU] via SUBCUTANEOUS
  Administered 2023-12-12 – 2023-12-13 (×3): 4 [IU] via SUBCUTANEOUS
  Administered 2023-12-13 – 2023-12-14 (×3): 3 [IU] via SUBCUTANEOUS
  Administered 2023-12-14 (×2): 4 [IU] via SUBCUTANEOUS
  Administered 2023-12-15: 11 [IU] via SUBCUTANEOUS
  Administered 2023-12-15: 7 [IU] via SUBCUTANEOUS

## 2023-12-09 MED ORDER — HEPARIN (PORCINE) 25000 UT/250ML-% IV SOLN
10.0000 [IU]/kg/h | INTRAVENOUS | Status: DC
Start: 2023-12-09 — End: 2023-12-09

## 2023-12-09 MED ORDER — FUROSEMIDE 10 MG/ML IJ SOLN
40.0000 mg | Freq: Every day | INTRAMUSCULAR | Status: DC
  Administered 2023-12-09 – 2023-12-10 (×2): 40 mg via INTRAVENOUS
  Filled 2023-12-09 (×2): qty 4

## 2023-12-09 MED ORDER — HEPARIN (PORCINE) 25000 UT/250ML-% IV SOLN
2000.0000 [IU]/h | INTRAVENOUS | Status: AC
  Administered 2023-12-09 – 2023-12-10 (×2): 1500 [IU]/h via INTRAVENOUS
  Administered 2023-12-11: 2000 [IU]/h via INTRAVENOUS
  Filled 2023-12-09 (×4): qty 250

## 2023-12-09 MED ORDER — INSULIN ASPART 100 UNIT/ML IJ SOLN: 0.0000 [IU] | Freq: Every day | INTRAMUSCULAR | Status: DC

## 2023-12-09 MED ORDER — IOHEXOL 350 MG/ML SOLN
75.0000 mL | Freq: Once | INTRAVENOUS | Status: AC | PRN
  Administered 2023-12-09: 75 mL via INTRAVENOUS

## 2023-12-09 MED ORDER — INSULIN GLARGINE 100 UNITS/ML SOLOSTAR PEN
6.0000 [IU] | PEN_INJECTOR | SUBCUTANEOUS | Status: DC
  Filled 2023-12-09: qty 3

## 2023-12-09 NOTE — Plan of Care (Signed)
  Problem: Education: Goal: Ability to describe self-care measures that may prevent or decrease complications (Diabetes Survival Skills Education) will improve Outcome: Progressing Goal: Individualized Educational Video(s) Outcome: Progressing   Problem: Coping: Goal: Ability to adjust to condition or change in health will improve Outcome: Progressing   Problem: Fluid Volume: Goal: Ability to maintain a balanced intake and output will improve Outcome: Progressing   Problem: Health Behavior/Discharge Planning: Goal: Ability to identify and utilize available resources and services will improve Outcome: Progressing Goal: Ability to manage health-related needs will improve Outcome: Progressing   Problem: Metabolic: Goal: Ability to maintain appropriate glucose levels will improve Outcome: Progressing   Problem: Nutritional: Goal: Maintenance of adequate nutrition will improve Outcome: Progressing Goal: Progress toward achieving an optimal weight will improve Outcome: Progressing   Problem: Education: Goal: Knowledge of General Education information will improve Description: Including pain rating scale, medication(s)/side effects and non-pharmacologic comfort measures Outcome: Progressing

## 2023-12-09 NOTE — Progress Notes (Addendum)
 Progress Note   Patient: Misty Walsh FMW:[id number] DOB: 14-Oct-1990 DOA: 12/01/2023  DOS: the patient was seen and examined on 12/09/2023   Brief hospital course:  33 year old woman PMH including diabetes, presented with abdominal pain nausea and dry heaving, p/w sepsis and huge suspected tubo-ovarian abscess.  Transferred from [facility] to [facility].   Assessment and Plan:  ADDENDUM:   Acute pulmonary embolism -CTA positive for acute PE in RUL/RML and LLL segments.  No strain.  Will initiate hep drip, pharmacy consult.  Ob/gyn and surg in the loop.  Monitor O2 closely.  Etiology unclear, but likely exacerbated by obesity, infection, surgery.     Concern for sepsis - Fever, leukocytosis, noted large abscess given concern for sepsis.  Blood cultures, IV fluid bolus, empiric broad-spectrum antibiotics on board.  Monitoring leukocytosis and blood pressure closely.   Large tubo-ovarian abscess - MRI noting 22.4 cm complex cystic mass centered in the right adnexa suspicious for abscess.  OB/Gyn following closely.  POD #1 ExLap, lysis of adhesions, removal of left cyst wall.  Leukocytosis profound (WBC 32.3), as to be expected postop.  Continues on broad-spectrum empiric antibiotics with ceftriaxone , Doxy, Flagyl .  Pain control per gyn/surg.    Possible acute hypoxic respiratory failure - Patient noted to be tachypneic, dyspneic postop.  Patient stating it was worse last night but improved this morning.  Multifactorial etiology including postop dyspnea from extubation, volume overload due to aggressive volume resuscitation, increased pain, or even PE.  CT angio ordered and pending.  Will empirically initiate IV Lasix 40 mg daily.  Monitor urine output response.  Monitor respiratory rate and O2 sat closely.   Hypokalemia - Improved this morning.  P.o. potassium supplementation on twice daily.  Will leave on for now given initiation of diuretics mentioned above.  IV fluids half NS with 20 of K.   Will recheck BMP and magnesium  in AM.   Acute kidney injury - Creatinine showing improvement this morning.  IV fluid hydration on board.  Urine output excellent.  Foley in place.  Will monitor urine output and recheck kidney function in AM.   Diabetes mellitus - Still not well-controlled.  Likely exacerbated by infection and surgery.  A1c 6.3 suggesting excellent control at home.  P.o. intake poor as well.  Will initiated insulin  glargine 6 units daily.  Increase sliding scale to high dose.  Monitor glucose closely.  Class III obesity - BMI greater than 51.  Encourage lifestyle modifications after hospitalization.  Physical debilitation muscle weakness - Exacerbated by above.  PT/OT on board.  To get patient moving today.  Subjective: Patient resting comfortably this morning.  Stated that she was more short of breath last night but feels a bit better this morning.  Pain well-controlled.  States she was able to tolerate drinking her potassium, first for her.  Still a bit nauseous overall.  A bit rough but doing well all things considered.  Denies any fever, chest pain, purulent sputum.  Physical Exam:  Vitals:   12/09/23 0646 12/09/23 0759 12/09/23 0840 12/09/23 1016  BP:  (!) 160/99    Pulse:  (!) 108    Resp:   (!) 36 (!) 32  Temp:  99.3 F (37.4 C)    TempSrc:  Oral    SpO2: 100%   97%  Weight:      Height:        GENERAL:  obese, pleasant, mild acute distress, malaise HEENT:  EOMI with nasal cannula CARDIOVASCULAR:  RRR,  no murmurs appreciated RESPIRATORY: Mildly tachypneic, poor air movement bilaterally GASTROINTESTINAL:  Soft, diffusely mildly tender EXTREMITIES:  No LE edema bilaterally NEURO:  No new focal deficits appreciated SKIN:  No rashes noted PSYCH:  Appropriate mood and affect, pleasant   Data Reviewed:  Imaging Studies: DG Chest Port 1 View Result Date: 12/03/2023 EXAM: 1 VIEW(S) XRAY OF THE CHEST 12/03/2023 08:34:00 AM COMPARISON: 12/01/2023 CLINICAL  HISTORY: Dyspnea FINDINGS: LUNGS AND PLEURA: Poor inspiratory effort. No focal infiltrate is seen. Left basilar atelectasis has resolved. HEART AND MEDIASTINUM: No acute abnormality of the cardiac and mediastinal silhouettes. BONES AND SOFT TISSUES: No acute osseous abnormality. IMPRESSION: 1. Resolution of prior left basilar atelectasis. Electronically signed by: Oneil Devonshire MD 12/03/2023 11:45 AM EDT RP Workstation: [id number]   MR PELVIS W WO CONTRAST Result Date: 12/02/2023 CLINICAL DATA:  Pelvic pain, nausea, and vomiting. Pelvic mass on recent CT. EXAM: MRI PELVIS WITHOUT AND WITH CONTRAST TECHNIQUE: Multiplanar multisequence MR imaging of the pelvis was performed both before and after administration of intravenous contrast. CONTRAST:  10mL GADAVIST GADOBUTROL 1 MMOL/ML IV SOLN COMPARISON:  CT on 12/01/2023 FINDINGS: Lower Urinary Tract: Bladder is displaced anteriorly by large cystic mass in central pelvis, however, no intrinsic bladder abnormality identified. Bowel: Unremarkable pelvic bowel loops. Vascular/Lymphatic: Unremarkable. No pathologically enlarged pelvic lymph nodes identified. Reproductive: -- Uterus: Measures 11.2 x 3.5 by 5.5 cm. Displaced anteriorly and to the left by large cystic mass in central pelvis. No otherwise unremarkable in appearance. -- Right ovary: No normal ovary visualized. A complex cystic lesion is seen which is centered in the right adnexa, displacing the rectum and uterus to the left. This lesion contains multiple internal cystic components with T1 hypointense fluid and peripheral enhancement, some of which appear tubular in shape. An internal air-fluid level is also noted. There is surrounding edema and tiny amount of free fluid. This measures 22.4 by 8.0 by 9.8 cm. This is highly suspicious for a large tubo-ovarian abscess, with cystic ovarian neoplasm and endometriosis considered less likely. -- Left ovary:  No normal ovary visualized. Other: Tiny amount of free fluid  seen in intraperitoneal and presacral spaces. Musculoskeletal:  Unremarkable. IMPRESSION: 22.4 cm complex cystic mass centered in right adnexa. Features are highly suspicious for a large tubo-ovarian abscess, with cystic ovarian neoplasm and endometriosis considered less likely. Electronically Signed   By: Norleen DELENA Kil M.D.   On: 12/02/2023 16:13   CT ABDOMEN PELVIS W CONTRAST Result Date: 12/01/2023 EXAM: CT ABDOMEN AND PELVIS WITH CONTRAST 12/01/2023 04:32:27 PM TECHNIQUE: CT of the abdomen and pelvis was performed with the administration of 100 mL of iohexol  (OMNIPAQUE ) 300 MG/ML solution. Multiplanar reformatted images are provided for review. Automated exposure control, iterative reconstruction, and/or weight-based adjustment of the mA/kV was utilized to reduce the radiation dose to as low as reasonably achievable. COMPARISON: Comparison with 02/13/2022 and ultrasound 08/14/2022. CLINICAL HISTORY: N/V. Eval for pyelonephritis vs nephrolithiasis. Triage Notes: Patient BIB [facility] from home. Abdominal pain for 5 days, nausea and vomiting. 3 days ago prescribed antibiotic for UTI but trouble keeping the medication down. Said her urine culture came back positive today. Had covid 1 week ago. FINDINGS: LOWER CHEST: No acute abnormality. LIVER: Hepatic steatosis. GALLBLADDER AND BILE DUCTS: Gallbladder is unremarkable. No biliary ductal dilatation. SPLEEN: No acute abnormality. PANCREAS: No acute abnormality. ADRENAL GLANDS: No acute abnormality. KIDNEYS, URETERS AND BLADDER: No stones in the kidneys or ureters. No hydronephrosis. No perinephric or periureteral stranding. Urinary bladder is unremarkable. GI AND BOWEL: Stomach  demonstrates no acute abnormality. There is no bowel obstruction. Normal appendix. PERITONEUM AND RETROPERITONEUM: No ascites. No free air. VASCULATURE: Aorta is normal in caliber. LYMPH NODES: No lymphadenopathy. REPRODUCTIVE ORGANS: Right hydrosalpinx has increased compared to 02/13/2022.  Comparison with ultrasound 08/14/2022 is limited due to differences in technique. Complex right ovarian cystic lesion has increased from the prior CT and measures 6.1 x 5.2 cm. There is a new low-density cystic lesion measuring 7.2 x 5.3 x 7.7 cm. The lesion is superior to the uterus and bladder. The origin is unclear. This may arise from the complex right paraovarian cyst or the left ovary. BONES AND SOFT TISSUES: No acute osseous abnormality. No focal soft tissue abnormality. IMPRESSION: 1. New low-density cystic lesion measuring 7.7 cm, superior to the uterus and bladder, with unclear origin; may arise from the complex right paraovarian cyst or the left ovary. 2. Complex right ovarian cystic lesion has increased from the prior CT and measures 6.1 cm. 3. Right hydrosalpinx has increased compared to prior CT. 4. Recommend gynecologic consultation and/or MRI w/ IV contrast for further characterization . Electronically signed by: Norman Gatlin MD 12/01/2023 05:20 PM EDT RP Workstation: [id number]   DG Chest Portable 1 View Result Date: 12/01/2023 CLINICAL DATA:  Shortness of breath, sepsis EXAM: PORTABLE CHEST 1 VIEW COMPARISON:  09/19/2016 FINDINGS: Low lung volumes are present, causing crowding of the pulmonary vasculature. Bandlike opacity just above the left hemidiaphragm probably representing atelectasis in the left lower lobe. Hazy density along the right hemithorax likely mostly attributable to soft tissues of the chest wall and the underlying low lung volumes, no discrete airspace opacity identified on the right. No blunting the costophrenic angles. No significant bony findings. IMPRESSION: 1. Low lung volumes are present, causing crowding of the pulmonary vasculature. 2. Bandlike opacity just above the left hemidiaphragm probably representing atelectasis in the left lower lobe. Electronically Signed   By: Ryan Salvage M.D.   On: 12/01/2023 13:44    Results are pending, will review when  available.  Previous records (including but not limited to H&P, progress notes, nursing notes, TOC management) were reviewed in assessment of this patient.  Labs: CBC: Recent Labs  Lab 12/05/23 0730 12/06/23 0418 12/07/23 0426 12/08/23 0436 12/09/23 0444  WBC 21.9* 20.6* 17.6* 18.3* 32.3*  HGB 10.6* 10.4* 9.8* 10.2* 10.4*  HCT 31.4* 31.9* 29.8* 31.4* 31.6*  MCV 91.8 93.3 94.3 92.9 92.7  PLT 306 343 338 254 404*   Basic Metabolic Panel: Recent Labs  Lab 12/05/23 0730 12/06/23 0418 12/07/23 0426 12/07/23 1356 12/08/23 0436 12/09/23 0444  NA 135 137 137 136 136 133*  K 2.8* 3.0* 2.9* 3.2* 3.1* 3.9  CL 97* 98 99 99 96* 99  CO2 19* 23 25 24 27 23   GLUCOSE 147* 152* 138* 144* 156* 231*  BUN 9 9 7 7 6 10   CREATININE 1.30* 1.30* 1.10* 1.06* 1.14* 1.15*  CALCIUM  8.7* 8.7* 8.5* 8.6* 8.8* 8.2*  MG 2.0 2.3 1.9  --  1.9 1.7   Liver Function Tests: Recent Labs  Lab 12/05/23 0730 12/06/23 0418 12/07/23 0426 12/08/23 0436 12/09/23 0444  AST 36 46* 37 32 33  ALT 27 28 24 22 20   ALKPHOS 75 84 66 67 58  BILITOT 0.6 0.7 0.5 0.6 0.6  PROT 7.0 7.1 6.4* 6.9 6.4*  ALBUMIN 2.4* 2.4* 2.1* 2.1* 1.9*   CBG: Recent Labs  Lab 12/08/23 1446 12/08/23 1644 12/08/23 1800 12/08/23 2142 12/09/23 0837  GLUCAP 128* 169* 198* 207* 203*  Scheduled Meds:  atorvastatin  80 mg Oral Daily   chlorthalidone   25 mg Oral Daily   furosemide  40 mg Intravenous Daily   HYDROmorphone    Intravenous Q4H   insulin  aspart  0-20 Units Subcutaneous TID WC   insulin  aspart  0-5 Units Subcutaneous QHS   insulin  glargine-yfgn  6 Units Subcutaneous Daily   metoCLOPramide  (REGLAN ) injection  5 mg Intravenous Q8H   pantoprazole   40 mg Oral Daily   potassium chloride   20 mEq Oral BID   Continuous Infusions:  0.45 % NaCl with KCl 20 mEq / L 50 mL/hr at 12/08/23 1821   cefTRIAXone  (ROCEPHIN )  IV Stopped (12/08/23 2100)   doxycycline  (VIBRAMYCIN ) IV 100 mg (12/09/23 0645)   metronidazole  500 mg  (12/09/23 0755)   promethazine  (PHENERGAN ) injection (IM or IVPB)     PRN Meds:.alum & mag hydroxide-simeth, diphenhydrAMINE  **OR** diphenhydrAMINE , hydrALAZINE, loperamide, naloxone  **AND** sodium chloride  flush, mouth rinse, oxyCODONE , promethazine  (PHENERGAN ) injection (IM or IVPB), zolpidem   Family Communication: None at bedside  Disposition: Status is: Inpatient Remains inpatient appropriate because: Sepsis, abscess     Time spent: 51 minutes  Length of inpatient stay: 8 days  Author: Carliss LELON Canales, DO 12/09/2023 11:50 AM  For on call review [email or web address].

## 2023-12-09 NOTE — Anesthesia Postprocedure Evaluation (Signed)
 Anesthesia Post Note  Patient: Misty Walsh  Procedure(s) Performed: HYSTERECTOMY, ABDOMINAL     Patient location during evaluation: PACU Anesthesia Type: General Level of consciousness: awake and alert Pain management: pain level controlled Vital Signs Assessment: post-procedure vital signs reviewed and stable Respiratory status: spontaneous breathing, nonlabored ventilation and respiratory function stable Cardiovascular status: blood pressure returned to baseline and stable Postop Assessment: no apparent nausea or vomiting Anesthetic complications: no   No notable events documented.               Benuel Ly

## 2023-12-09 NOTE — Progress Notes (Signed)
 PHARMACY - ANTICOAGULATION CONSULT NOTE  Pharmacy Consult for Heparin Indication: pulmonary embolus  Allergies  Allergen Reactions   Macrobid  [Nitrofurantoin  Macrocrystal] Itching    Caused the patient to feel hot.    Patient Measurements: Height: 5' 4 (162.6 cm) Weight: 136.1 kg (300 lb) IBW/kg (Calculated) : 54.7 HEPARIN DW (KG): 88.7  Vital Signs: Temp: 99.3 F (37.4 C) (10/21 0759) Temp Source: Oral (10/21 0759) BP: 160/99 (10/21 0759) Pulse Rate: 108 (10/21 0759)  Labs: Recent Labs    12/07/23 0426 12/07/23 1356 12/08/23 0436 12/09/23 0444  HGB 9.8*  --  10.2* 10.4*  HCT 29.8*  --  31.4* 31.6*  PLT 338  --  254 404*  LABPROT  --   --  16.3*  --   INR  --   --  1.2  --   CREATININE 1.10* 1.06* 1.14* 1.15*  CKTOTAL 355*  --   --   --     Estimated Creatinine Clearance: 95 mL/min (A) (by C-G formula based on SCr of 1.15 mg/dL (H)).   Medical History: Past Medical History:  Diagnosis Date   Anxiety    Back pain    Chlamydia    Depression med made her navel itching and  made her sleepy so she quit taking them   Diabetes mellitus without complication (HCC)    Edema, lower extremity    Elevated cholesterol    Fibrocystic breast changes of both breasts    GERD (gastroesophageal reflux disease)    History of cesarean section, classical 12/18/2012   2014    Human papilloma virus    Hypertension    Insomnia    Joint pain    Moderate dysplasia of cervix    Numbness    right arm to hand   Ovarian cyst    Prediabetes    Tubo-ovarian abscess 12/02/2023   Vitamin D  deficiency     Assessment: 33yo F presented with sepsis 2/2 TOA. Initially treated with prophylactic lovenox, but anticoagulation held x24h prior to procedure on 10/20. Now patient noting dyspnea and CT chest reveals evidence of pulmonary embolism. Pharmacy consulted to transition from prophylactic lovenox to treatment dose heparin for treatment of new PE  Hgb 10.4 - remains stable post-op,  plt 404  Goal of Therapy:  Heparin level 0.3-0.7 units/ml Monitor platelets by anticoagulation protocol: Yes   Plan:  Give 5500 units bolus x 1 Start heparin infusion at 1500 units/hr Check anti-Xa level in 8 hours and daily while on heparin Continue to monitor H&H and platelets  Bethany Cumming 12/09/2023,2:16 PM

## 2023-12-09 NOTE — Progress Notes (Signed)
 Patient ambulated into hallway and to restroom. Foley d/c at this time. Patient sitting in recliner.

## 2023-12-09 NOTE — Progress Notes (Signed)
 [facility] Progress Note  1 Day Post-Op  Subjective: CC:  Reports pain as tolerable - worse when she tries to get OOB, stood EOB this AM and her cc was leg pain and heaviness. This morning she is drowsy but does arouse to answer questions appropriately. Denies flatus/BM. Mild nausea last night, worse with sprite. Foley in place.  Objective: Vital signs in last 24 hours: Temp:  [97.6 F (36.4 C)-99.3 F (37.4 C)] 99.3 F (37.4 C) (10/21 0759) Pulse Rate:  [101-144] 108 (10/21 0759) Resp:  [17-38] 36 (10/21 0625) BP: (128-161)/(65-99) 160/99 (10/21 0759) SpO2:  [85 %-100 %] 100 % (10/21 0646) Weight:  [136.1 kg] 136.1 kg (10/20 1246) Last BM Date : 12/06/23  Intake/Output from previous day: 10/20 0701 - 10/21 0700 In: 4417.3 [I.V.:2612.4; IV Piggyback:1779.9] Out: 3670 [Urine:3230; Drains:190; Blood:250] Intake/Output this shift: No intake/output data recorded.  PE: Gen:  Alert, NAD, pleasant and cooperative Card:  sinus tachycardia Pulm:  Normal effort on [place] Abd: Soft, appropriately tender, honeycomb in place, JP drains are serosanguinous.  Skin: warm and dry, no rashes  Psych: A&Ox3   Lab Results:  Recent Labs    12/08/23 0436 12/09/23 0444  WBC 18.3* 32.3*  HGB 10.2* 10.4*  HCT 31.4* 31.6*  PLT 254 404*   BMET Recent Labs    12/08/23 0436 12/09/23 0444  NA 136 133*  K 3.1* 3.9  CL 96* 99  CO2 27 23  GLUCOSE 156* 231*  BUN 6 10  CREATININE 1.14* 1.15*  CALCIUM  8.8* 8.2*   PT/INR Recent Labs    12/08/23 0436  LABPROT 16.3*  INR 1.2   CMP     Component Value Date/Time   NA 133 (L) 12/09/2023 0444   NA 139 11/20/2022 0930   K 3.9 12/09/2023 0444   CL 99 12/09/2023 0444   CO2 23 12/09/2023 0444   GLUCOSE 231 (H) 12/09/2023 0444   BUN 10 12/09/2023 0444   BUN 12 11/20/2022 0930   CREATININE 1.15 (H) 12/09/2023 0444   CALCIUM  8.2 (L) 12/09/2023 0444   PROT 6.4 (L) 12/09/2023 0444   PROT 7.0 11/20/2022 0930   ALBUMIN 1.9 (L)  12/09/2023 0444   ALBUMIN 4.3 11/20/2022 0930   AST 33 12/09/2023 0444   ALT 20 12/09/2023 0444   ALKPHOS 58 12/09/2023 0444   BILITOT 0.6 12/09/2023 0444   BILITOT 0.4 11/20/2022 0930   GFRNONAA >60 12/09/2023 0444   GFRAA 127 11/03/2019 1152   Lipase     Component Value Date/Time   LIPASE 25 12/04/2022 1357       Studies/Results: No results found.  Anti-infectives: Anti-infectives (From admission, onward)    Start     Dose/Rate Route Frequency Ordered Stop   12/08/23 0845  cefOXitin (MEFOXIN) 2 g in sodium chloride  0.9 % 100 mL IVPB        2 g 200 mL/hr over 30 Minutes Intravenous On call to O.R. 12/08/23 0758 12/08/23 1424   12/06/23 2000  cefTRIAXone  (ROCEPHIN ) 2 g in sodium chloride  0.9 % 100 mL IVPB        2 g 200 mL/hr over 30 Minutes Intravenous Every 24 hours 12/06/23 0723 12/11/23 1959   12/02/23 1800  doxycycline  (VIBRAMYCIN ) 100 mg in sodium chloride  0.9 % 250 mL IVPB        100 mg 125 mL/hr over 120 Minutes Intravenous Every 12 hours 12/02/23 1736     12/02/23 1800  metroNIDAZOLE  (FLAGYL ) IVPB 500 mg  500 mg 100 mL/hr over 60 Minutes Intravenous Every 12 hours 12/02/23 1736     12/01/23 2000  cefTRIAXone  (ROCEPHIN ) 2 g in sodium chloride  0.9 % 100 mL IVPB        2 g 200 mL/hr over 30 Minutes Intravenous Every 24 hours 12/01/23 1920 12/05/23 2052   12/01/23 1300  piperacillin-tazobactam (ZOSYN) IVPB 3.375 g        3.375 g 100 mL/hr over 30 Minutes Intravenous  Once 12/01/23 1259 12/01/23 1420       Assessment/Plan  POD#1 ex lap,  LOA, removal of left cyst wall for TOA 10/20 Dr. Ozan; intra-op general surgery consult to evaluate the rectosigmoid junction without evidence of colon injury. - afebrile, sinus tachycardia, HTN - continue drains, no current evidence of colonic injury - consider scheduled non-narcotic pain meds in addition to PCA (Tylenol , advil , robaxin) - diet advancement per primary team.  - [facility] will follow     LOS: 8 days   I  reviewed nursing notes, hospitalist notes, last 24 h vitals and pain scores, last 48 h intake and output, last 24 h labs and trends, and last 24 h imaging results.  This care required straight-forward level of medical decision making.   Almarie Pringle, PA-C [facility] Please see Amion for pager number during day hours 7:00am-4:30pm

## 2023-12-09 NOTE — Progress Notes (Signed)
 [facility] Practice OB/GYN Attending Progress Note  ADMISSION DIAGNOSIS:   Principal Problem:   Sepsis due to gram-negative UTI [facility]) Active Problems:   Type 2 diabetes mellitus without complication, without long-term current use of insulin  (HCC)   Tubo-ovarian abscess   Acute pyelonephritis   BMI 50.0-59.9, adult (HCC)   Obesity, class 3 (HCC)   Subjective   Notes some nausea, no vomiting.  Tolerating some sips/liquids.  Dangled at bedside with some difficulty.  No flatus, no BM.  Foley in place.  No fever or chills.  Notes difficulty with deep respirations.   Objective  VITALS:  height is 5' 4 (1.626 m) and weight is 136.1 kg. Her oral temperature is 99.3 F (37.4 C). Her blood pressure is 160/99 (abnormal) and her pulse is 108 (abnormal). Her respiration is 32 (abnormal) and oxygen saturation is 97%.    EXAMINATION: CONSTITUTIONAL: no acute distress, appears uncomfortable SKIN: Skin is warm and dry, slightly diaphoretic.  NEUROLOGIC: Alert and oriented to person, place, and time.  PSYCHIATRIC: Normal mood and affect. Normal behavior. Normal judgment and thought content. CARDIOVASCULAR: Normal heart rate noted RESPIRATORY: CTAB, some difficulty with respiratory effort specifically deep respirations MUSCULOSKELETAL: limited mobility- difficulty with sitting up in bed. ABDOMEN: obese, soft, appropriately tender, BS quiet Midline vertical incision with honeycomb in place- clean and dry JP drains with serosanguinous fluid Ext: 2+ edema bilaterally.  No evidence of DVT on exam Laboratory Reports: Results for orders placed or performed during the hospital encounter of 12/01/23 (from the past 72 hours)  Glucose, capillary     Status: Abnormal   Collection Time: 12/06/23 11:58 AM  Result Value Ref Range   Glucose-Capillary 152 (H) 70 - 99 mg/dL    Comment: Glucose reference range applies only to samples taken after fasting for at least 8 hours.  Glucose,  capillary     Status: Abnormal   Collection Time: 12/06/23  5:26 PM  Result Value Ref Range   Glucose-Capillary 172 (H) 70 - 99 mg/dL    Comment: Glucose reference range applies only to samples taken after fasting for at least 8 hours.  Glucose, capillary     Status: Abnormal   Collection Time: 12/07/23  1:56 AM  Result Value Ref Range   Glucose-Capillary 133 (H) 70 - 99 mg/dL    Comment: Glucose reference range applies only to samples taken after fasting for at least 8 hours.  CBC     Status: Abnormal   Collection Time: 12/07/23  4:26 AM  Result Value Ref Range   WBC 17.6 (H) 4.0 - 10.5 K/uL   RBC 3.16 (L) 3.87 - 5.11 MIL/uL   Hemoglobin 9.8 (L) 12.0 - 15.0 g/dL   HCT 70.1 (L) 63.9 - 53.9 %   MCV 94.3 80.0 - 100.0 fL   MCH 31.0 26.0 - 34.0 pg   MCHC 32.9 30.0 - 36.0 g/dL   RDW 86.8 88.4 - 84.4 %   Platelets 338 150 - 400 K/uL   nRBC 0.0 0.0 - 0.2 %    Comment: Performed at [facility], [address] [place], [place] [address]  CK     Status: Abnormal   Collection Time: 12/07/23  4:26 AM  Result Value Ref Range   Total CK 355 (H) 38 - 234 U/L    Comment: Performed at [facility], [address], [place], [place] [address]  Magnesium   Status: None   Collection Time: 12/07/23  4:26 AM  Result Value Ref Range   Magnesium  1.9 1.7 - 2.4 mg/dL    Comment: Performed at [facility], [address] [place], [place] [address]  Comprehensive metabolic panel with GFR     Status: Abnormal   Collection Time: 12/07/23  4:26 AM  Result Value Ref Range   Sodium 137 135 - 145 mmol/L   Potassium 2.9 (L) 3.5 - 5.1 mmol/L   Chloride 99 98 - 111 mmol/L   CO2 25 22 - 32 mmol/L   Glucose, Bld 138 (H) 70 - 99 mg/dL    Comment: Glucose reference range applies only to samples taken after fasting for at least 8 hours.   BUN 7 6 - 20 mg/dL   Creatinine, Ser 8.89 (H) 0.44 - 1.00 mg/dL   Calcium  8.5 (L) 8.9 - 10.3 mg/dL   Total Protein 6.4 (L) 6.5 - 8.1 g/dL   Albumin 2.1  (L) 3.5 - 5.0 g/dL   AST 37 15 - 41 U/L   ALT 24 0 - 44 U/L   Alkaline Phosphatase 66 38 - 126 U/L   Total Bilirubin 0.5 0.0 - 1.2 mg/dL   GFR, Estimated >39 >39 mL/min    Comment: (NOTE) Calculated using the CKD-EPI Creatinine Equation (2021)    Anion gap 13 5 - 15    Comment: Performed at [facility], [address] [place], [place] [address]  Glucose, capillary     Status: Abnormal   Collection Time: 12/07/23  5:18 AM  Result Value Ref Range   Glucose-Capillary 145 (H) 70 - 99 mg/dL    Comment: Glucose reference range applies only to samples taken after fasting for at least 8 hours.  Glucose, capillary     Status: Abnormal   Collection Time: 12/07/23 12:24 PM  Result Value Ref Range   Glucose-Capillary 148 (H) 70 - 99 mg/dL    Comment: Glucose reference range applies only to samples taken after fasting for at least 8 hours.  Basic metabolic panel     Status: Abnormal   Collection Time: 12/07/23  1:56 PM  Result Value Ref Range   Sodium 136 135 - 145 mmol/L   Potassium 3.2 (L) 3.5 - 5.1 mmol/L   Chloride 99 98 - 111 mmol/L   CO2 24 22 - 32 mmol/L   Glucose, Bld 144 (H) 70 - 99 mg/dL    Comment: Glucose reference range applies only to samples taken after fasting for at least 8 hours.   BUN 7 6 - 20 mg/dL   Creatinine, Ser 8.93 (H) 0.44 - 1.00 mg/dL   Calcium  8.6 (L) 8.9 - 10.3 mg/dL   GFR, Estimated >39 >39 mL/min    Comment: (NOTE) Calculated using the CKD-EPI Creatinine Equation (2021)    Anion gap 13 5 - 15    Comment: Performed at [facility], [address], [place], [place] [address]  Glucose, capillary     Status: Abnormal   Collection Time: 12/07/23  5:21 PM  Result Value Ref Range   Glucose-Capillary 121 (H) 70 - 99 mg/dL    Comment: Glucose reference range applies only to samples taken after fasting for at least 8 hours.  Type and screen [facility]     Status: None (Preliminary result)   Collection Time: 12/07/23  9:27 PM   Result Value Ref Range   ABO/RH(D) O POS    Antibody Screen NEG  Sample Expiration      12/10/2023,2359 Performed at [facility], [address] [place], [place] [address]    Unit Number [id number]    Blood Component Type RED CELLS,LR    Unit division 00    Status of Unit ALLOCATED    Transfusion Status OK TO TRANSFUSE    Crossmatch Result Compatible    Unit Number [id number]    Blood Component Type RED CELLS,LR    Unit division 00    Status of Unit ALLOCATED    Transfusion Status OK TO TRANSFUSE    Crossmatch Result Compatible   Glucose, capillary     Status: Abnormal   Collection Time: 12/07/23  9:55 PM  Result Value Ref Range   Glucose-Capillary 164 (H) 70 - 99 mg/dL    Comment: Glucose reference range applies only to samples taken after fasting for at least 8 hours.  CBC     Status: Abnormal   Collection Time: 12/08/23  4:36 AM  Result Value Ref Range   WBC 18.3 (H) 4.0 - 10.5 K/uL   RBC 3.38 (L) 3.87 - 5.11 MIL/uL   Hemoglobin 10.2 (L) 12.0 - 15.0 g/dL   HCT 68.5 (L) 63.9 - 53.9 %   MCV 92.9 80.0 - 100.0 fL   MCH 30.2 26.0 - 34.0 pg   MCHC 32.5 30.0 - 36.0 g/dL   RDW 87.0 88.4 - 84.4 %   Platelets 254 150 - 400 K/uL   nRBC 0.0 0.0 - 0.2 %    Comment: Performed at [facility], [address], [place], [place] [address]  Comprehensive metabolic panel with GFR     Status: Abnormal   Collection Time: 12/08/23  4:36 AM  Result Value Ref Range   Sodium 136 135 - 145 mmol/L   Potassium 3.1 (L) 3.5 - 5.1 mmol/L   Chloride 96 (L) 98 - 111 mmol/L   CO2 27 22 - 32 mmol/L   Glucose, Bld 156 (H) 70 - 99 mg/dL    Comment: Glucose reference range applies only to samples taken after fasting for at least 8 hours.   BUN 6 6 - 20 mg/dL   Creatinine, Ser 8.85 (H) 0.44 - 1.00 mg/dL   Calcium  8.8 (L) 8.9 - 10.3 mg/dL   Total Protein 6.9 6.5 - 8.1 g/dL   Albumin 2.1 (L) 3.5 - 5.0 g/dL   AST 32 15 - 41 U/L   ALT 22 0 - 44 U/L   Alkaline Phosphatase 67 38 -  126 U/L   Total Bilirubin 0.6 0.0 - 1.2 mg/dL   GFR, Estimated >39 >39 mL/min    Comment: (NOTE) Calculated using the CKD-EPI Creatinine Equation (2021)    Anion gap 13 5 - 15    Comment: Performed at [facility], [address] [place], [place] [address]  Protime-INR     Status: Abnormal   Collection Time: 12/08/23  4:36 AM  Result Value Ref Range   Prothrombin Time 16.3 (H) 11.4 - 15.2 seconds   INR 1.2 0.8 - 1.2    Comment: (NOTE) INR goal varies based on device and disease states. Performed at [facility], [address], [place], [place] [address]   Magnesium      Status: None   Collection Time: 12/08/23  4:36 AM  Result Value Ref Range   Magnesium  1.9 1.7 - 2.4 mg/dL    Comment: Performed at [facility], [address], [place], [place]  [address]  Glucose, capillary     Status: Abnormal   Collection Time: 12/08/23  8:59 AM  Result Value Ref Range   Glucose-Capillary 164 (H) 70 - 99 mg/dL    Comment: Glucose reference range applies only to samples taken after fasting for at least 8 hours.  Glucose, capillary     Status: Abnormal   Collection Time: 12/08/23 12:17 PM  Result Value Ref Range   Glucose-Capillary 191 (H) 70 - 99 mg/dL    Comment: Glucose reference range applies only to samples taken after fasting for at least 8 hours.  Pregnancy, urine POC     Status: None   Collection Time: 12/08/23  2:01 PM  Result Value Ref Range   Preg Test, Ur NEGATIVE NEGATIVE    Comment:        THE SENSITIVITY OF THIS METHODOLOGY IS >20 mIU/mL.   Prepare RBC (crossmatch) INTRAOP ONLY     Status: None   Collection Time: 12/08/23  2:30 PM  Result Value Ref Range   Order Confirmation      ORDER PROCESSED BY BLOOD BANK Performed at [facility], [address] [place], [place] [address]   Glucose, capillary     Status: Abnormal   Collection Time: 12/08/23  2:46 PM  Result Value Ref Range   Glucose-Capillary 128 (H) 70 - 99 mg/dL    Comment: Glucose  reference range applies only to samples taken after fasting for at least 8 hours.  Glucose, capillary     Status: Abnormal   Collection Time: 12/08/23  4:44 PM  Result Value Ref Range   Glucose-Capillary 169 (H) 70 - 99 mg/dL    Comment: Glucose reference range applies only to samples taken after fasting for at least 8 hours.  Glucose, capillary     Status: Abnormal   Collection Time: 12/08/23  6:00 PM  Result Value Ref Range   Glucose-Capillary 198 (H) 70 - 99 mg/dL    Comment: Glucose reference range applies only to samples taken after fasting for at least 8 hours.  Glucose, capillary     Status: Abnormal   Collection Time: 12/08/23  9:42 PM  Result Value Ref Range   Glucose-Capillary 207 (H) 70 - 99 mg/dL    Comment: Glucose reference range applies only to samples taken after fasting for at least 8 hours.  Magnesium      Status: None   Collection Time: 12/09/23  4:44 AM  Result Value Ref Range   Magnesium  1.7 1.7 - 2.4 mg/dL    Comment: Performed at [facility], [address] [place], [place] [address]  CBC     Status: Abnormal   Collection Time: 12/09/23  4:44 AM  Result Value Ref Range   WBC 32.3 (H) 4.0 - 10.5 K/uL   RBC 3.41 (L) 3.87 - 5.11 MIL/uL   Hemoglobin 10.4 (L) 12.0 - 15.0 g/dL   HCT 68.3 (L) 63.9 - 53.9 %   MCV 92.7 80.0 - 100.0 fL   MCH 30.5 26.0 - 34.0 pg   MCHC 32.9 30.0 - 36.0 g/dL   RDW 87.1 88.4 - 84.4 %   Platelets 404 (H) 150 - 400 K/uL   nRBC 0.0 0.0 - 0.2 %    Comment: Performed at [facility], [address], [place], [place] [address]  Comprehensive metabolic panel with GFR     Status: Abnormal   Collection Time: 12/09/23  4:44 AM  Result Value Ref Range  Sodium 133 (L) 135 - 145 mmol/L   Potassium 3.9 3.5 - 5.1 mmol/L   Chloride 99 98 - 111 mmol/L   CO2 23 22 - 32 mmol/L   Glucose, Bld 231 (H) 70 - 99 mg/dL    Comment: Glucose reference range applies only to samples taken after fasting for at least 8 hours.   BUN 10 6 - 20 mg/dL    Creatinine, Ser 8.84 (H) 0.44 - 1.00 mg/dL   Calcium  8.2 (L) 8.9 - 10.3 mg/dL   Total Protein 6.4 (L) 6.5 - 8.1 g/dL   Albumin 1.9 (L) 3.5 - 5.0 g/dL   AST 33 15 - 41 U/L   ALT 20 0 - 44 U/L   Alkaline Phosphatase 58 38 - 126 U/L   Total Bilirubin 0.6 0.0 - 1.2 mg/dL   GFR, Estimated >39 >39 mL/min    Comment: (NOTE) Calculated using the CKD-EPI Creatinine Equation (2021)    Anion gap 11 5 - 15    Comment: Performed at [facility], [address] [place], [place] [address]  Glucose, capillary     Status: Abnormal   Collection Time: 12/09/23  8:37 AM  Result Value Ref Range   Glucose-Capillary 203 (H) 70 - 99 mg/dL    Comment: Glucose reference range applies only to samples taken after fasting for at least 8 hours.    I personally reviewed Labs and Imaging Studies under Results section.    ASSESSMENT/PLAN: S/p Exlap/washout, POD#1 due to large TOA complicated by- T2DM, Obesity, HTN  1) Pain/Postop -plan to transition of PCA later this afternoon/evening -IV toradol  x 24hrs then ibuprofen  -tylenol  and gabapentin scheduled -adequate UOP, foley to be discontinued -advance diet as tolerated  2) Resp/Elevated RR -plan for CT to r/o PE -may likely be due to body habitus, recent surgery and immobility- as above work on ambulation -will continue to closely monitor  3) ID -currently on IV antibiotics- Rocephin /Doxy/Flagyl  -cultures collected -anticipated leukocytosis noted, will continue to follow  4) Heme -CBC stable, continue daily labs  5) Co-morbidities -appreciated hospitalist management including management of DM and anti-hypertensive -Lasix also ordered  DVT Prophylaxis:  encourage ambulation and lovenox Code Status: Full  Disposition Plan: Care as outlined above, continue inpatient management    LOS: 8 days   Darwyn Ponzo, DO Attending Obstetrician & Gynecologist, [facility] for [employer], [facility]

## 2023-12-10 ENCOUNTER — Inpatient Hospital Stay (HOSPITAL_COMMUNITY): Payer: MEDICAID

## 2023-12-10 ENCOUNTER — Other Ambulatory Visit (HOSPITAL_COMMUNITY): Payer: Self-pay

## 2023-12-10 DIAGNOSIS — Z86711 Personal history of pulmonary embolism: Secondary | ICD-10-CM

## 2023-12-10 LAB — HEPARIN LEVEL (UNFRACTIONATED)
Heparin Unfractionated: 0.18 [IU]/mL — ABNORMAL LOW (ref 0.30–0.70)
Heparin Unfractionated: 0.24 [IU]/mL — ABNORMAL LOW (ref 0.30–0.70)
Heparin Unfractionated: 0.49 [IU]/mL (ref 0.30–0.70)

## 2023-12-10 LAB — GLUCOSE, CAPILLARY
Glucose-Capillary: 161 mg/dL — ABNORMAL HIGH (ref 70–99)
Glucose-Capillary: 197 mg/dL — ABNORMAL HIGH (ref 70–99)
Glucose-Capillary: 167 mg/dL — ABNORMAL HIGH (ref 70–99)

## 2023-12-10 LAB — CBC
HCT: 27.2 % — ABNORMAL LOW (ref 36.0–46.0)
Hemoglobin: 8.7 g/dL — ABNORMAL LOW (ref 12.0–15.0)
MCH: 30.4 pg (ref 26.0–34.0)
MCHC: 32 g/dL (ref 30.0–36.0)
MCV: 95.1 fL (ref 80.0–100.0)
Platelets: 348 K/uL (ref 150–400)
RBC: 2.86 MIL/uL — ABNORMAL LOW (ref 3.87–5.11)
RDW: 13.2 % (ref 11.5–15.5)
WBC: 22.1 K/uL — ABNORMAL HIGH (ref 4.0–10.5)
nRBC: 0 % (ref 0.0–0.2)

## 2023-12-10 LAB — COMPREHENSIVE METABOLIC PANEL WITH GFR
ALT: 23 U/L (ref 0–44)
AST: 48 U/L — ABNORMAL HIGH (ref 15–41)
Albumin: 2.1 g/dL — ABNORMAL LOW (ref 3.5–5.0)
Alkaline Phosphatase: 55 U/L (ref 38–126)
Anion gap: 10 (ref 5–15)
BUN: 16 mg/dL (ref 6–20)
CO2: 24 mmol/L (ref 22–32)
Calcium: 8 mg/dL — ABNORMAL LOW (ref 8.9–10.3)
Chloride: 99 mmol/L (ref 98–111)
Creatinine, Ser: 1.2 mg/dL — ABNORMAL HIGH (ref 0.44–1.00)
GFR, Estimated: >60 mL/min (ref >60)
Glucose, Bld: 156 mg/dL — ABNORMAL HIGH (ref 70–99)
Potassium: 3.4 mmol/L — ABNORMAL LOW (ref 3.5–5.1)
Sodium: 133 mmol/L — ABNORMAL LOW (ref 135–145)
Total Bilirubin: 0.6 mg/dL (ref 0.0–1.2)
Total Protein: 6.3 g/dL — ABNORMAL LOW (ref 6.5–8.1)

## 2023-12-10 LAB — SURGICAL PATHOLOGY

## 2023-12-10 LAB — MAGNESIUM: Magnesium: 1.8 mg/dL (ref 1.7–2.4)

## 2023-12-10 LAB — PHOSPHORUS: Phosphorus: 2.7 mg/dL (ref 2.5–4.6)

## 2023-12-10 MED ORDER — MENTHOL 3 MG MT LOZG
1.0000 | LOZENGE | OROMUCOSAL | Status: DC | PRN
  Administered 2023-12-10: 3 mg via ORAL
  Filled 2023-12-10 (×2): qty 9

## 2023-12-10 MED ORDER — ONDANSETRON HCL 4 MG/2ML IJ SOLN
4.0000 mg | Freq: Three times a day (TID) | INTRAMUSCULAR | Status: DC | PRN
  Administered 2023-12-10 – 2023-12-11 (×2): 4 mg via INTRAVENOUS
  Filled 2023-12-10 (×2): qty 2

## 2023-12-10 MED ORDER — HYDROMORPHONE HCL 1 MG/ML IJ SOLN
1.0000 mg | INTRAMUSCULAR | Status: DC | PRN
  Administered 2023-12-10 – 2023-12-11 (×4): 1 mg via INTRAVENOUS
  Filled 2023-12-10 (×5): qty 1

## 2023-12-10 MED ORDER — HYDROMORPHONE HCL 1 MG/ML IJ SOLN
1.0000 mg | Freq: Once | INTRAMUSCULAR | Status: AC
  Administered 2023-12-10: 1 mg via INTRAVENOUS
  Filled 2023-12-10: qty 1

## 2023-12-10 MED ORDER — IBUPROFEN 600 MG PO TABS: 600.0000 mg | ORAL_TABLET | Freq: Four times a day (QID) | ORAL | Status: DC

## 2023-12-10 MED ORDER — FAMOTIDINE IN NACL 20-0.9 MG/50ML-% IV SOLN
20.0000 mg | Freq: Two times a day (BID) | INTRAVENOUS | Status: DC
  Administered 2023-12-10 – 2023-12-12 (×6): 20 mg via INTRAVENOUS
  Filled 2023-12-10 (×6): qty 50

## 2023-12-10 MED ORDER — HEPARIN BOLUS VIA INFUSION
1350.0000 [IU] | Freq: Once | INTRAVENOUS | Status: AC
  Administered 2023-12-10: 1350 [IU] via INTRAVENOUS
  Filled 2023-12-10: qty 1350

## 2023-12-10 MED ORDER — HEPARIN BOLUS VIA INFUSION
2650.0000 [IU] | Freq: Once | INTRAVENOUS | Status: AC
  Administered 2023-12-10: 2650 [IU] via INTRAVENOUS
  Filled 2023-12-10: qty 2650

## 2023-12-10 NOTE — Progress Notes (Signed)
 [facility] Practice OB/GYN Attending Progress Note  ADMISSION DIAGNOSIS:   Principal Problem:   Sepsis (HCC) Active Problems:   Type 2 diabetes mellitus without complication, without long-term current use of insulin  (HCC)   Tubo-ovarian abscess   BMI 50.0-59.9, adult (HCC)   Obesity, class 3 (HCC)   AKI (acute kidney injury)   Acute hypoxic respiratory failure (HCC)   S/P exploratory laparotomy   Subjective  Patient notes some pain this a.m. and having some difficulty with oral pain medication.  She notes considerable nausea and only tolerating some clears.  Patient voiding without difficulty patient has been ambulating with assistance from RN and PT. patient had BM today though she denies passing flatus.  Denies fever or chills.  Reports no acute complaints this a.m.   Objective  VITALS:  height is 5' 4 (1.626 m) and weight is 136.1 kg. Her oral temperature is 98.6 F (37 C). Her blood pressure is 139/70 and her pulse is 101 (abnormal). Her respiration is 19 and oxygen saturation is 95%.    EXAMINATION: CONSTITUTIONAL: no acute distress.   SKIN: Skin is warm and dry. Not diaphoretic. No erythema. No pallor. NEUROLOGIC: Alert and oriented to person, place, and time. PSYCHIATRIC: Flat affect, normal behavior CARDIOVASCULAR: Normal heart rate noted RESPIRATORY: Effort and breath sounds normal, respirations effort improved since yesterday.  On 2L [place] MUSCULOSKELETAL: Normal range of motion. No edema and no pain with movement ABDOMEN: obese, appropriately tender, no rebound or guarding, BS quiet, but improved from yesterday Incision: Clean/dry/intact with staples and honeycomb.  JP drain x 2 with minimal serosanguinous fluid EXT: 2+ edema, reporting diffuse tenderness to palpation.  No erythema noted  Laboratory Reports: Results for orders placed or performed during the hospital encounter of 12/01/23 (from the past 72 hours)  Glucose, capillary     Status:  Abnormal   Collection Time: 12/07/23 12:24 PM  Result Value Ref Range   Glucose-Capillary 148 (H) 70 - 99 mg/dL    Comment: Glucose reference range applies only to samples taken after fasting for at least 8 hours.  Basic metabolic panel     Status: Abnormal   Collection Time: 12/07/23  1:56 PM  Result Value Ref Range   Sodium 136 135 - 145 mmol/L   Potassium 3.2 (L) 3.5 - 5.1 mmol/L   Chloride 99 98 - 111 mmol/L   CO2 24 22 - 32 mmol/L   Glucose, Bld 144 (H) 70 - 99 mg/dL    Comment: Glucose reference range applies only to samples taken after fasting for at least 8 hours.   BUN 7 6 - 20 mg/dL   Creatinine, Ser 8.93 (H) 0.44 - 1.00 mg/dL   Calcium  8.6 (L) 8.9 - 10.3 mg/dL   GFR, Estimated >39 >39 mL/min    Comment: (NOTE) Calculated using the CKD-EPI Creatinine Equation (2021)    Anion gap 13 5 - 15    Comment: Performed at [facility], [address] [place], [place] [address]  Glucose, capillary     Status: Abnormal   Collection Time: 12/07/23  5:21 PM  Result Value Ref Range   Glucose-Capillary 121 (H) 70 - 99 mg/dL    Comment: Glucose reference range applies only to samples taken after fasting for at least 8 hours.  Type and screen [facility]     Status: None (Preliminary result)   Collection Time: 12/07/23  9:27 PM  Result  Value Ref Range   ABO/RH(D) O POS    Antibody Screen NEG    Sample Expiration      12/10/2023,2359 Performed at [facility], [address], [place], [place] [address]    Unit Number [id number]    Blood Component Type RED CELLS,LR    Unit division 00    Status of Unit ALLOCATED    Transfusion Status OK TO TRANSFUSE    Crossmatch Result Compatible    Unit Number [id number]    Blood Component Type RED CELLS,LR    Unit division 00    Status of Unit ALLOCATED    Transfusion Status OK TO TRANSFUSE    Crossmatch Result Compatible   Glucose, capillary     Status: Abnormal   Collection Time: 12/07/23  9:55 PM   Result Value Ref Range   Glucose-Capillary 164 (H) 70 - 99 mg/dL    Comment: Glucose reference range applies only to samples taken after fasting for at least 8 hours.  CBC     Status: Abnormal   Collection Time: 12/08/23  4:36 AM  Result Value Ref Range   WBC 18.3 (H) 4.0 - 10.5 K/uL   RBC 3.38 (L) 3.87 - 5.11 MIL/uL   Hemoglobin 10.2 (L) 12.0 - 15.0 g/dL   HCT 68.5 (L) 63.9 - 53.9 %   MCV 92.9 80.0 - 100.0 fL   MCH 30.2 26.0 - 34.0 pg   MCHC 32.5 30.0 - 36.0 g/dL   RDW 87.0 88.4 - 84.4 %   Platelets 254 150 - 400 K/uL   nRBC 0.0 0.0 - 0.2 %    Comment: Performed at [facility], [address] [place], [place] [address]  Comprehensive metabolic panel with GFR     Status: Abnormal   Collection Time: 12/08/23  4:36 AM  Result Value Ref Range   Sodium 136 135 - 145 mmol/L   Potassium 3.1 (L) 3.5 - 5.1 mmol/L   Chloride 96 (L) 98 - 111 mmol/L   CO2 27 22 - 32 mmol/L   Glucose, Bld 156 (H) 70 - 99 mg/dL    Comment: Glucose reference range applies only to samples taken after fasting for at least 8 hours.   BUN 6 6 - 20 mg/dL   Creatinine, Ser 8.85 (H) 0.44 - 1.00 mg/dL   Calcium  8.8 (L) 8.9 - 10.3 mg/dL   Total Protein 6.9 6.5 - 8.1 g/dL   Albumin 2.1 (L) 3.5 - 5.0 g/dL   AST 32 15 - 41 U/L   ALT 22 0 - 44 U/L   Alkaline Phosphatase 67 38 - 126 U/L   Total Bilirubin 0.6 0.0 - 1.2 mg/dL   GFR, Estimated >39 >39 mL/min    Comment: (NOTE) Calculated using the CKD-EPI Creatinine Equation (2021)    Anion gap 13 5 - 15    Comment: Performed at [facility], [address] [place], [place] [address]  Protime-INR     Status: Abnormal   Collection Time: 12/08/23  4:36 AM  Result Value Ref Range   Prothrombin Time 16.3 (H) 11.4 - 15.2 seconds   INR 1.2 0.8 - 1.2    Comment: (NOTE) INR goal varies based on device and disease states. Performed at [facility], [address], [place], [place] [address]   Magnesium      Status: None   Collection Time: 12/08/23   4:36 AM  Result Value Ref Range   Magnesium  1.9 1.7 -  2.4 mg/dL    Comment: Performed at [facility], [address], [place], [place] [address]  Glucose, capillary     Status: Abnormal   Collection Time: 12/08/23  8:59 AM  Result Value Ref Range   Glucose-Capillary 164 (H) 70 - 99 mg/dL    Comment: Glucose reference range applies only to samples taken after fasting for at least 8 hours.  Glucose, capillary     Status: Abnormal   Collection Time: 12/08/23 12:17 PM  Result Value Ref Range   Glucose-Capillary 191 (H) 70 - 99 mg/dL    Comment: Glucose reference range applies only to samples taken after fasting for at least 8 hours.  Pregnancy, urine POC     Status: None   Collection Time: 12/08/23  2:01 PM  Result Value Ref Range   Preg Test, Ur NEGATIVE NEGATIVE    Comment:        THE SENSITIVITY OF THIS METHODOLOGY IS >20 mIU/mL.   Prepare RBC (crossmatch) INTRAOP ONLY     Status: None   Collection Time: 12/08/23  2:30 PM  Result Value Ref Range   Order Confirmation      ORDER PROCESSED BY BLOOD BANK Performed at [facility], [address] [place], [place] [address]   Glucose, capillary     Status: Abnormal   Collection Time: 12/08/23  2:46 PM  Result Value Ref Range   Glucose-Capillary 128 (H) 70 - 99 mg/dL    Comment: Glucose reference range applies only to samples taken after fasting for at least 8 hours.  Surgical pathology     Status: None   Collection Time: 12/08/23  4:23 PM  Result Value Ref Range   SURGICAL PATHOLOGY      SURGICAL PATHOLOGY CASE: [id number] PATIENT: Misty Walsh Surgical Pathology Report     Clinical History: abdominal pain (cm)     FINAL MICROSCOPIC DIAGNOSIS:  A. CYST WALL, EXCISION:      Benign cyst wall with cyst lining denudation.      Negative for malignancy.   GROSS DESCRIPTION:  Specimen is received fresh and consists of a 4.5 x 3.4 x 1.0 cm disrupted portion of red-purple, dusky possible membranous  tissue. Representative sections are submitted in 1 cassette.  (KL 12/09/2023)  Final Diagnosis performed by Pepper Dutton, MD.   Electronically signed 12/10/2023 Technical and / or Professional components performed at [facility]. [facility], [address] [place], [place] [address].  Immunohistochemistry Technical component (if applicable) was performed at [facility]. [address], [place], [place] [address].   IMMUNOHISTOCHEMISTRY DISCLAIMER (if applicable): Some of these immunohistochemical stains may have be en developed and the performance characteristics determine by [facility]. Some may not have been cleared or approved by the U.S. Food and Drug Administration. The FDA has determined that such clearance or approval is not necessary. This test is used for clinical purposes. It should not be regarded as investigational or for research. This laboratory is certified under the Clinical Laboratory Improvement Amendments of 1988 (CLIA-88) as qualified to perform high complexity clinical laboratory testing.  The controls stained appropriately.   IHC stains are performed on formalin fixed, paraffin embedded tissue using a 3,3diaminobenzidine (DAB) chromogen and Leica Bond Autostainer System. The staining intensity of the nucleus is score manually and is reported as the percentage of tumor cell nuclei demonstrating specific nuclear staining. The specimens are fixed in 10% Neutral Formalin for at least 6 hours and  up to 72hrs. These tests are validated on decalcified t issue. Results should be interpreted with caution given the possibility of false negative results on decalcified specimens. Antibody Clones are as follows ER-clone 51F, PR-clone 16, Ki67- clone MM1. Some of these immunohistochemical stains may have been developed and the performance characteristics determined by [facility].   Glucose, capillary     Status: Abnormal    Collection Time: 12/08/23  4:44 PM  Result Value Ref Range   Glucose-Capillary 169 (H) 70 - 99 mg/dL    Comment: Glucose reference range applies only to samples taken after fasting for at least 8 hours.  Glucose, capillary     Status: Abnormal   Collection Time: 12/08/23  6:00 PM  Result Value Ref Range   Glucose-Capillary 198 (H) 70 - 99 mg/dL    Comment: Glucose reference range applies only to samples taken after fasting for at least 8 hours.  Glucose, capillary     Status: Abnormal   Collection Time: 12/08/23  9:42 PM  Result Value Ref Range   Glucose-Capillary 207 (H) 70 - 99 mg/dL    Comment: Glucose reference range applies only to samples taken after fasting for at least 8 hours.  Magnesium      Status: None   Collection Time: 12/09/23  4:44 AM  Result Value Ref Range   Magnesium  1.7 1.7 - 2.4 mg/dL    Comment: Performed at [facility], [address] [place], [place] [address]  CBC     Status: Abnormal   Collection Time: 12/09/23  4:44 AM  Result Value Ref Range   WBC 32.3 (H) 4.0 - 10.5 K/uL   RBC 3.41 (L) 3.87 - 5.11 MIL/uL   Hemoglobin 10.4 (L) 12.0 - 15.0 g/dL   HCT 68.3 (L) 63.9 - 53.9 %   MCV 92.7 80.0 - 100.0 fL   MCH 30.5 26.0 - 34.0 pg   MCHC 32.9 30.0 - 36.0 g/dL   RDW 87.1 88.4 - 84.4 %   Platelets 404 (H) 150 - 400 K/uL   nRBC 0.0 0.0 - 0.2 %    Comment: Performed at [facility], [address] [place], [place] [address]  Comprehensive metabolic panel with GFR     Status: Abnormal   Collection Time: 12/09/23  4:44 AM  Result Value Ref Range   Sodium 133 (L) 135 - 145 mmol/L   Potassium 3.9 3.5 - 5.1 mmol/L   Chloride 99 98 - 111 mmol/L   CO2 23 22 - 32 mmol/L   Glucose, Bld 231 (H) 70 - 99 mg/dL    Comment: Glucose reference range applies only to samples taken after fasting for at least 8 hours.   BUN 10 6 - 20 mg/dL   Creatinine, Ser 8.84 (H) 0.44 - 1.00 mg/dL   Calcium  8.2 (L) 8.9 - 10.3 mg/dL   Total Protein 6.4 (L) 6.5 - 8.1 g/dL    Albumin 1.9 (L) 3.5 - 5.0 g/dL   AST 33 15 - 41 U/L   ALT 20 0 - 44 U/L   Alkaline Phosphatase 58 38 - 126 U/L   Total Bilirubin 0.6 0.0 - 1.2 mg/dL   GFR, Estimated >39 >39 mL/min    Comment: (NOTE) Calculated using the CKD-EPI Creatinine Equation (2021)    Anion gap 11 5 - 15    Comment: Performed at [facility], [address], [place], [place] [address]  Glucose, capillary     Status: Abnormal  Collection Time: 12/09/23  8:37 AM  Result Value Ref Range   Glucose-Capillary 203 (H) 70 - 99 mg/dL    Comment: Glucose reference range applies only to samples taken after fasting for at least 8 hours.  Aerobic Culture w Gram Stain (superficial specimen)     Status: None (Preliminary result)   Collection Time: 12/09/23 12:31 PM   Specimen: Abdomen  Result Value Ref Range   Specimen Description ABDOMEN    Special Requests NONE    Gram Stain NO WBC SEEN NO ORGANISMS SEEN     Culture      NO GROWTH < 24 HOURS Performed at [facility], [address] [place], [place] [address]    Report Status PENDING   Glucose, capillary     Status: Abnormal   Collection Time: 12/09/23  3:14 PM  Result Value Ref Range   Glucose-Capillary 208 (H) 70 - 99 mg/dL    Comment: Glucose reference range applies only to samples taken after fasting for at least 8 hours.  Glucose, capillary     Status: Abnormal   Collection Time: 12/09/23 10:09 PM  Result Value Ref Range   Glucose-Capillary 164 (H) 70 - 99 mg/dL    Comment: Glucose reference range applies only to samples taken after fasting for at least 8 hours.  Heparin level (unfractionated)     Status: None   Collection Time: 12/09/23 11:42 PM  Result Value Ref Range   Heparin Unfractionated 0.49 0.30 - 0.70 IU/mL    Comment: (NOTE) The clinical reportable range upper limit is being lowered to >1.10 to align with the FDA approved guidance for the current laboratory assay.  If heparin results are below expected values, and patient  dosage has  been confirmed, suggest follow up testing of antithrombin III levels. Performed at [facility], [address] [place], [place] [address]   CBC     Status: Abnormal   Collection Time: 12/10/23  4:30 AM  Result Value Ref Range   WBC 22.1 (H) 4.0 - 10.5 K/uL   RBC 2.86 (L) 3.87 - 5.11 MIL/uL   Hemoglobin 8.7 (L) 12.0 - 15.0 g/dL   HCT 72.7 (L) 63.9 - 53.9 %   MCV 95.1 80.0 - 100.0 fL   MCH 30.4 26.0 - 34.0 pg   MCHC 32.0 30.0 - 36.0 g/dL   RDW 86.7 88.4 - 84.4 %   Platelets 348 150 - 400 K/uL   nRBC 0.0 0.0 - 0.2 %    Comment: Performed at [facility], [address], [place], [place] [address]  Comprehensive metabolic panel with GFR     Status: Abnormal   Collection Time: 12/10/23  4:30 AM  Result Value Ref Range   Sodium 133 (L) 135 - 145 mmol/L   Potassium 3.4 (L) 3.5 - 5.1 mmol/L   Chloride 99 98 - 111 mmol/L   CO2 24 22 - 32 mmol/L   Glucose, Bld 156 (H) 70 - 99 mg/dL    Comment: Glucose reference range applies only to samples taken after fasting for at least 8 hours.   BUN 16 6 - 20 mg/dL   Creatinine, Ser 8.79 (H) 0.44 - 1.00 mg/dL   Calcium  8.0 (L) 8.9 - 10.3 mg/dL   Total Protein 6.3 (L) 6.5 - 8.1 g/dL   Albumin 2.1 (L) 3.5 - 5.0 g/dL   AST 48 (H) 15 - 41 U/L   ALT 23 0 - 44 U/L   Alkaline  Phosphatase 55 38 - 126 U/L   Total Bilirubin 0.6 0.0 - 1.2 mg/dL   GFR, Estimated >39 >39 mL/min    Comment: (NOTE) Calculated using the CKD-EPI Creatinine Equation (2021)    Anion gap 10 5 - 15    Comment: Performed at [facility], [address] [place], [place] [address]  Magnesium      Status: None   Collection Time: 12/10/23  4:30 AM  Result Value Ref Range   Magnesium  1.8 1.7 - 2.4 mg/dL    Comment: Performed at [facility], [address], [place], [place] [address]  Phosphorus     Status: None   Collection Time: 12/10/23  4:30 AM  Result Value Ref Range   Phosphorus 2.7 2.5 - 4.6 mg/dL    Comment: Performed at [facility], [address] [place], [place] [address]  Heparin level (unfractionated)     Status: Abnormal   Collection Time: 12/10/23  6:41 AM  Result Value Ref Range   Heparin Unfractionated 0.24 (L) 0.30 - 0.70 IU/mL    Comment: (NOTE) The clinical reportable range upper limit is being lowered to >1.10 to align with the FDA approved guidance for the current laboratory assay.  If heparin results are below expected values, and patient dosage has  been confirmed, suggest follow up testing of antithrombin III levels. Performed at [facility], [address] [place], [place] [address]   Glucose, capillary     Status: Abnormal   Collection Time: 12/10/23  8:38 AM  Result Value Ref Range   Glucose-Capillary 161 (H) 70 - 99 mg/dL    Comment: Glucose reference range applies only to samples taken after fasting for at least 8 hours.   Radiology Reports: CT Angio Chest Pulmonary Embolism (PE) W or WO Contrast Addendum Date: 12/09/2023 ADDENDUM REPORT: 12/09/2023 15:33 ADDENDUM: Critical Value/emergent results were called by telephone at the time of interpretation on 12/09/2023 at 3:06 p.m. to provider DELON PRUDE , who verbally acknowledged these results. Electronically Signed   By: Toribio Agreste M.D.   On: 12/09/2023 15:33   Result Date: 12/09/2023 CLINICAL DATA:  Dyspnea with recent surgery. Possible pulmonary embolism. EXAM: CT ANGIOGRAPHY CHEST WITH CONTRAST TECHNIQUE: Multidetector CT imaging of the chest was performed using the standard protocol during bolus administration of intravenous contrast. Multiplanar CT image reconstructions and MIPs were obtained to evaluate the vascular anatomy. RADIATION DOSE REDUCTION: This exam was performed according to the departmental dose-optimization program which includes automated exposure control, adjustment of the mA and/or kV according to patient size and/or use of iterative reconstruction technique. CONTRAST:  75mL OMNIPAQUE  IOHEXOL  350 MG/ML  SOLN COMPARISON:  None Available. FINDINGS: Cardiovascular: Heart is normal size. Thoracic aorta is normal in caliber. Pulmonary arterial system is adequately opacified and demonstrates emboli over a subsegmental left lower lobar artery. There are also emboli over the proximal right upper/middle lobe arteries. RV/LV ratio is 3.8/4.0 = 0.95. Remaining vascular structures are unremarkable. Mediastinum/Nodes: No mediastinal or hilar adenopathy. Remaining mediastinal structures are normal. Lungs/Pleura: Lungs are adequately inflated with bibasilar linear atelectasis. Mild linear atelectasis over the right upper lobe. No effusion. Airways are normal. Upper Abdomen: No acute findings over the visualized upper abdomen. Musculoskeletal: No focal bony abnormality. Oval 2.5 cm mass with a few coarse calcifications over the medial right breast. Review of the MIP images confirms the above findings. IMPRESSION: 1. Evidence of pulmonary emboli over the proximal right upper/middle lobe arteries and subsegmental  left lower lobar artery. No evidence of right heart strain. 2. Bibasilar and right upper lobe linear atelectasis. 3. 2.5 cm oval mass with a few coarse calcifications over the medial right breast. Recommend diagnostic mammographic correlation. Paging ordering physician. Electronically Signed: By: Toribio Agreste M.D. On: 12/09/2023 13:39    I personally reviewed Labs and Imaging Studies under Results section.    ASSESSMENT/PLAN: 1) Pain -due to limited po intake- will add back additional IV pain medication as well as additional IV anti-emetics - Adding ibuprofen ; however will review with IM due to current creatinine level -pt on scheduled tylenol , gabapentin and oxycodone  prn  2) Acute PE/Heme -currently on heparin drip - Appreciate pharmacy and IM team recs -Hgb drop noted, likely due from heparin.  Minimal output from JP drains, no concern of active bleed  3) FEN --since pt has had BM, do not think  vomiting is GI, but rather due to copious mucus and patient positioning.  Adding lozenges as well.  IS at bedside -zofran  prn added -advance diet as tolerated and encouraged pt to try solid foods -hypokalemia- appreciate management per IM team -currently on Lasix 40mg  daily -adequate UOP  4) ID -pt has been afebrile x 48hrs -leukocytosis improving -JP drains in place with minimal fluid -for now continue IV antibiotics until pt is better able to tolerate po, will then switch to oral antibiotics  5) Co-morbidities including HTN, T2DM -management per IM team  Felecia Stanfill, DO Attending Obstetrician & Gynecologist, Faculty Practice Center for [facility], [facility]

## 2023-12-10 NOTE — Evaluation (Signed)
 Physical Therapy Evaluation Patient Details Name: Misty Walsh MRN: [id number] DOB: 1990-03-21 Today's Date: 12/10/2023  History of Present Illness  Pt is 33 year old presented to [facility] on  12/01/23 for nausea and abd pain. Found to have tubo-ovarian abscess. Transferred to [facility] on 12/04/23. Pt underwent exploratory lap, lysis of adhesions and removal of lt cyst wall on 12/08/23. PMH - DM, morbid obesity, HTN  Clinical Impression  Pt admitted with above diagnosis and presents to PT with functional limitations due to deficits listed below (See PT problem list). Pt needs skilled PT to maximize independence and safety. Pt limited with mobility due to pain and nausea. When asked says she lives alone although chart has documented she lives with children. When asked if anyone could help her out at home she said she didn't know and needs to work on it. Expect as pain and nausea improve her mobility will also improve. May need walker for home initially but should progress away from that on her own as pain improves. Doubt she will need follow up. Needs to continue to amb multiple times a day while here. Pt voiced concern about swollen feet. Encouraged pt to perform ankle pumps as well as mobilize/ambulate frequently for this.          If plan is discharge home, recommend the following: Assistance with cooking/housework;Assist for transportation   Can travel by private vehicle        Equipment Recommendations Rolling walker (2 wheels)  Recommendations for Other Services       Functional Status Assessment Patient has had a recent decline in their functional status and demonstrates the ability to make significant improvements in function in a reasonable and predictable amount of time.     Precautions / Restrictions Precautions Precautions: Other (comment) Precaution/Restrictions Comments: abd drains Restrictions Weight Bearing Restrictions Per Provider Order: No      Mobility  Bed  Mobility Overal bed mobility: Needs Assistance Bed Mobility: Sit to Sidelying         Sit to sidelying: Min assist General bed mobility comments: Assist to bring feet up into bed    Transfers Overall transfer level: Needs assistance Equipment used: Rolling walker (2 wheels) Transfers: Sit to/from Stand Sit to Stand: Contact guard assist           General transfer comment: Cues for hand placemetn    Ambulation/Gait Ambulation/Gait assistance: Supervision Gait Distance (Feet): 70 Feet Assistive device: Rolling walker (2 wheels) Gait Pattern/deviations: Step-through pattern, Decreased step length - right, Decreased step length - left, Decreased stride length, Wide base of support Gait velocity: decr Gait velocity interpretation: <1.31 ft/sec, indicative of household ambulator   General Gait Details: Distance limited by pt nausea  Stairs            Wheelchair Mobility     Tilt Bed    Modified Rankin (Stroke Patients Only)       Balance Overall balance assessment: No apparent balance deficits (not formally assessed)                                           Pertinent Vitals/Pain Pain Assessment Pain Assessment: Faces Faces Pain Scale: Hurts little more Pain Location: back, feet, abdomen Pain Descriptors / Indicators: Grimacing, Aching, Operative site guarding Pain Intervention(s): Limited activity within patient's tolerance, Monitored during session, Repositioned    Home Living Family/patient expects to  be discharged to:: Private residence Living Arrangements: Alone   Type of Home: Apartment Home Access: Level entry       Home Layout: One level Home Equipment: None      Prior Function Prior Level of Function : Independent/Modified Independent                     Extremity/Trunk Assessment   Upper Extremity Assessment Upper Extremity Assessment: Overall WFL for tasks assessed    Lower Extremity Assessment Lower  Extremity Assessment: Generalized weakness       Communication   Communication Communication: No apparent difficulties (soft spoken)    Cognition Arousal: Alert Behavior During Therapy: Flat affect   PT - Cognitive impairments: No apparent impairments                         Following commands: Intact       Cueing Cueing Techniques: Verbal cues     General Comments      Exercises General Exercises - Lower Extremity Ankle Circles/Pumps: AROM, Both, 15 reps, Supine   Assessment/Plan    PT Assessment Patient needs continued PT services  PT Problem List Decreased strength;Decreased activity tolerance;Decreased mobility;Obesity;Pain       PT Treatment Interventions DME instruction;Gait training;Functional mobility training;Therapeutic exercise;Therapeutic activities;Patient/family education    PT Goals (Current goals can be found in the Care Plan section)  Acute Rehab PT Goals Patient Stated Goal: get better PT Goal Formulation: With patient Time For Goal Achievement: 12/17/23 Potential to Achieve Goals: Good    Frequency Min 3X/week     Co-evaluation               AM-PAC PT 6 Clicks Mobility  Outcome Measure Help needed turning from your back to your side while in a flat bed without using bedrails?: A Little Help needed moving from lying on your back to sitting on the side of a flat bed without using bedrails?: A Little Help needed moving to and from a bed to a chair (including a wheelchair)?: A Little Help needed standing up from a chair using your arms (e.g., wheelchair or bedside chair)?: A Little Help needed to walk in hospital room?: A Little Help needed climbing 3-5 steps with a railing? : A Little 6 Click Score: 18    End of Session   Activity Tolerance: Other (comment) (Limited by nausea) Patient left: in bed;with call bell/phone within reach Nurse Communication: Mobility status PT Visit Diagnosis: Muscle weakness (generalized)  (M62.81);Difficulty in walking, not elsewhere classified (R26.2);Pain Pain - part of body: Ankle and joints of foot (abdomen and back)    Time: 9081-9060 PT Time Calculation (min) (ACUTE ONLY): 21 min   Charges:   PT Evaluation $PT Eval Low Complexity: 1 Low   PT General Charges $$ ACUTE PT VISIT: 1 Visit         [facility] PT Acute Rehabilitation Services Office [phone]   Rodgers ORN [facility] 12/10/2023, 9:57 AM

## 2023-12-10 NOTE — Progress Notes (Addendum)
 PHARMACY - ANTICOAGULATION CONSULT NOTE  Pharmacy Consult for Heparin Indication: pulmonary embolus  Allergies  Allergen Reactions   Macrobid  [Nitrofurantoin  Macrocrystal] Itching    Caused the patient to feel hot.    Patient Measurements: Height: 5' 4 (162.6 cm) Weight: 136.1 kg (300 lb) IBW/kg (Calculated) : 54.7 HEPARIN DW (KG): 88.7  Vital Signs: Temp: 98.8 F (37.1 C) (10/21 2345) Temp Source: Oral (10/21 2345) BP: 155/86 (10/22 0524) Pulse Rate: 98 (10/22 0524)  Labs: Recent Labs    12/08/23 0436 12/09/23 0444 12/09/23 2342 12/10/23 0430 12/10/23 0641  HGB 10.2* 10.4*  --  8.7*  --   HCT 31.4* 31.6*  --  27.2*  --   PLT 254 404*  --  348  --   LABPROT 16.3*  --   --   --   --   INR 1.2  --   --   --   --   HEPARINUNFRC  --   --  0.49  --  0.24*  CREATININE 1.14* 1.15*  --  1.20*  --     Estimated Creatinine Clearance: 91 mL/min (A) (by C-G formula based on SCr of 1.2 mg/dL (H)).   Medical History: Past Medical History:  Diagnosis Date   Anxiety    Back pain    Chlamydia    Depression med made her navel itching and  made her sleepy so she quit taking them   Diabetes mellitus without complication (HCC)    Edema, lower extremity    Elevated cholesterol    Fibrocystic breast changes of both breasts    GERD (gastroesophageal reflux disease)    History of cesarean section, classical 12/18/2012   2014    Human papilloma virus    Hypertension    Insomnia    Joint pain    Moderate dysplasia of cervix    Numbness    right arm to hand   Ovarian cyst    Prediabetes    Tubo-ovarian abscess 12/02/2023   Vitamin D  deficiency     Assessment: 33yo F presented with sepsis 2/2 TOA. Initially treated with prophylactic lovenox, but anticoagulation held x24h prior to procedure on 10/20. Now patient noting dyspnea and CT chest reveals evidence of pulmonary embolism. Pharmacy consulted to transition from prophylactic lovenox to treatment dose heparin for  treatment of new PE  Heparin level 0.24 (subtherapeutic) after therapeutic level X1 overnight. Hgb dropped from 10.4 to 8.7, plt stable at 348, no bleeding noted per RN.   Goal of Therapy:  Heparin level 0.3-0.7 units/ml Monitor platelets by anticoagulation protocol: Yes   Plan:  Give 1350 units bolus x1 Increase infusion rate to 1,700 units/h Check anti-Xa level in 8 hours and daily while on heparin Monitor H&H, platelets and ability to transition to oral treatment options  Safiatou Islam 12/10/2023,7:24 AM

## 2023-12-10 NOTE — Progress Notes (Signed)
 PHARMACY - ANTICOAGULATION CONSULT NOTE  Pharmacy Consult for Heparin Indication: pulmonary embolus  Allergies  Allergen Reactions   Macrobid  [Nitrofurantoin  Macrocrystal] Itching    Caused the patient to feel hot.    Patient Measurements: Height: 5' 4 (162.6 cm) Weight: 136.1 kg (300 lb) IBW/kg (Calculated) : 54.7 HEPARIN DW (KG): 88.7  Vital Signs: Temp: 98.1 F (36.7 C) (10/22 1236) Temp Source: Oral (10/22 1236) BP: 151/92 (10/22 1618) Pulse Rate: 103 (10/22 1618)  Labs: Recent Labs    12/08/23 0436 12/09/23 0444 12/09/23 2342 12/10/23 0430 12/10/23 0641 12/10/23 1524  HGB 10.2* 10.4*  --  8.7*  --   --   HCT 31.4* 31.6*  --  27.2*  --   --   PLT 254 404*  --  348  --   --   LABPROT 16.3*  --   --   --   --   --   INR 1.2  --   --   --   --   --   HEPARINUNFRC  --   --  0.49  --  0.24* 0.18*  CREATININE 1.14* 1.15*  --  1.20*  --   --     Estimated Creatinine Clearance: 91 mL/min (A) (by C-G formula based on SCr of 1.2 mg/dL (H)).   Medical History: Past Medical History:  Diagnosis Date   Anxiety    Back pain    Chlamydia    Depression med made her navel itching and  made her sleepy so she quit taking them   Diabetes mellitus without complication (HCC)    Edema, lower extremity    Elevated cholesterol    Fibrocystic breast changes of both breasts    GERD (gastroesophageal reflux disease)    History of cesarean section, classical 12/18/2012   2014    Human papilloma virus    Hypertension    Insomnia    Joint pain    Moderate dysplasia of cervix    Numbness    right arm to hand   Ovarian cyst    Prediabetes    Tubo-ovarian abscess 12/02/2023   Vitamin D  deficiency     Assessment: 33yo F presented with sepsis 2/2 TOA. Initially treated with prophylactic lovenox, but anticoagulation held x24h prior to procedure on 10/20. Now patient noting dyspnea and CT chest reveals evidence of pulmonary embolism. Pharmacy consulted to transition from  prophylactic lovenox to treatment dose heparin for treatment of new PE  Heparin level 0.24 (subtherapeutic) after therapeutic level X1 overnight. Hgb dropped from 10.4 to 8.7, plt stable at 348, no bleeding noted per RN.   Goal of Therapy:  Heparin level 0.3-0.7 units/ml Monitor platelets by anticoagulation protocol: Yes   Plan:  Give 1350 units bolus x1 Increase infusion rate to 1,700 units/h Check anti-Xa level in 8 hours and daily while on heparin Monitor H&H, platelets and ability to transition to oral treatment options  Jayvier Burgher 12/10/2023,4:28 PM  ---------------------------------------------------------------------------------------------------------------  8h heparin level 0.18 (subtherapeutic). Patient with significant nausea and vomiting and unable to transition to PO options at this time.   Plan:  Give 2650 units bolus x1 Increase infusion rate to 2000 units/h Check anti-Xa level in 8 hours and daily while on heparin Monitor H&H, platelets and ability to transition to oral treatment options

## 2023-12-10 NOTE — Progress Notes (Signed)
 Progress Note   Patient: Misty Walsh FMW:[id number] DOB: 03-02-1990 DOA: 12/01/2023     9 DOS: the patient was seen and examined on 12/10/2023   Assessment and Plan:  #Acute PE #Acute hypoxic respiratory failure  - Noted to be tachypneic with worsening dyspnea.  Found to have acute PE. - CTA chest (12/10/2023) showed PE over the proximal right upper/middle lobe arteries and subsegmental left lower lobar artery with no evidence of right heart strain. - Etiology unclear, likely multifactorial given decreased mobility, obesity, acute illness - On heparin drip per pharmacy - SpO2 95% on 2 L nasal cannula, wean as tolerated - Plan to transition to treatment dose Eliquis today if patient is without N/V, as OB/GYN indicated no plans for any further surgical intervention - Bilateral LE duplex ordered  #Sepsis secondary to large tubo-ovarian abscess - Presented with fever, leukocytosis, noted to have large tubo-ovarian abscess - Blood cultures (10/13) negative - On Rocephin , doxycycline , and Flagyl  for TOA as below  # Large tubo-ovarian abscess status post ex lap w/ washout - POD 2 - OBGYN following - Analgesia -Tylenol , gabapentin, PRN oxycodone  and Dilaudid . Recommend avoiding NSAIDs given rising creatinine - As needed antiemetics - Surgical pathology pending - Continue Rocephin , doxycycline , and Flagyl  - Encouraged OOB - Incentive spirometry  # AKI - Baseline Cr ~ 0.9. Rose to Cr 1.3 during hospitalization, improved with IVFs. Now worsened again to 1.20. Etiology likely multifactorial - initially decreased PO intake and sepsis, now post IV contrast exposure and Lasix  - Received Lasix today. Will hold off on daily Lasix and reassess tomorrow. - Avoiding NSAIDs - Patient reports her legs are more swollen than normal, therefore will also hold off on giving additional free IVFs for now and reassess tomorrow - Strict I/Os  # Right breast mass - Incidental finding of 2.5 cm oval  mass with few coarse calcifications over the medial right breast was noted on CTA chest - Recommend follow-up with PCP or OB/GYN outpatient diagnostic mammography  #T2DM - Hgb A1c 6.3 (12/02/2023), well-controlled at home - Received dexamethasone  intraoperatively and had postop hyperglycemia - Started on glargine 6 units daily on 10/21 - SSI  # Hypokalemia - K3.4 - On KCl 20 mEq twice daily  # HLD - Continue Lipitor 80 mg daily  #Hypertension - Continue chlorthalidone  25 mg daily  #Debility - PT/OT following  #Class III / Morbid Obesity (BMI Body mass index is 51.49 kg/m. kg/m) - Obesity is clinically significant and contributes to T2DM and HTN.  - Discussed lifestyle modification including dietary changes, regular physical activity, and weight reduction strategies.       Subjective: Patient seen at bedside this morning.  Patient was somnolent sleepy after receiving IV Dilaudid  a few minutes prior.  Reports continued decreased p.o. intake, states she keeps forgetting to order food.  RN reported patient was complaining of nausea and vomiting due to increased secretions. Patient rates her pain at 10/10 prior to dilaudid  dose, but hasn't felt effect of dilaudid  yet. States she's getting up with PT and RN. Had a BM earlier today. Denies any chest pain, worsening SOB, fevers, chills.   Physical Exam: Vitals:   12/09/23 2016 12/09/23 2345 12/10/23 0200 12/10/23 0524  BP:  (!) 145/78  (!) 155/86  Pulse:  96  98  Resp:  (!) 30  (!) 30  Temp:  98.8 F (37.1 C)    TempSrc:  Oral    SpO2: 95% 94% 96% 95%  Weight:  Height:       Physical Exam Constitutional:      General: She is not in acute distress.    Appearance: She is obese. She is not ill-appearing.     Interventions: Nasal cannula in place.  HENT:     Mouth/Throat:     Mouth: Mucous membranes are moist.  Cardiovascular:     Rate and Rhythm: Normal rate and regular rhythm.     Heart sounds: Normal heart sounds.  No murmur heard. Pulmonary:     Effort: Pulmonary effort is normal. No respiratory distress.     Comments: Appears to be currently comfortable on 2L [place]. Unable to evaluate posterior lung fields Abdominal:     General: There is no distension.     Palpations: Abdomen is soft.     Tenderness: There is abdominal tenderness (Appropriately TTP lower abdomen). There is no guarding.  Genitourinary:    Comments: JP drains x 2 in place, minimal serosanguineous fluid Musculoskeletal:     Right lower leg: No edema.     Left lower leg: No edema.  Skin:    General: Skin is warm and dry.     Capillary Refill: Capillary refill takes less than 2 seconds.  Neurological:     Mental Status: She is alert and oriented to person, place, and time. Mental status is at baseline.     Family Communication: None  Disposition: Status is: Inpatient Remains inpatient appropriate because: Requiring IV antibiotics for TOA, persistent nausea and vomiting, AKI, and acute PE  Planned Discharge Destination: Home    DVT Prophylaxis: Heparin drip   Time spent: 50 minutes  Author: Duffy Larch, MD 12/10/2023 7:35 AM  For on call review [email or web address].

## 2023-12-10 NOTE — Progress Notes (Signed)
 [facility] Progress Note  2 Days Post-Op  Subjective: CC:  Feels overall stable. C/o RLE>LLE pain. Tolerating small amts PO intake, mostly liquids. +flatus. Reports having a BM, unable to describe.   Objective: Vital signs in last 24 hours: Temp:  [97.5 F (36.4 C)-98.8 F (37.1 C)] 98.1 F (36.7 C) (10/22 1236) Pulse Rate:  [96-105] 102 (10/22 1236) Resp:  [19-32] 20 (10/22 1236) BP: (137-155)/(66-87) 154/87 (10/22 1236) SpO2:  [94 %-98 %] 98 % (10/22 1236) Last BM Date : 12/06/23  Intake/Output from previous day: 10/21 0701 - 10/22 0700 In: 1245.4 [P.O.:240; I.V.:255.3; IV Piggyback:750] Out: 670 [Urine:600; Drains:70] Intake/Output this shift: Total I/O In: -  Out: 400 [Urine:400]  PE: Gen:  Alert, NAD, cooperative,  Card:  sinus tachycardia Pulm:  Normal effort on [place] Abd: Soft, appropriately tender, honeycomb in place, JP drains are serosanguinous.  Skin: warm and dry, no rashes  Psych: A&Ox3   Lab Results:  Recent Labs    12/09/23 0444 12/10/23 0430  WBC 32.3* 22.1*  HGB 10.4* 8.7*  HCT 31.6* 27.2*  PLT 404* 348   BMET Recent Labs    12/09/23 0444 12/10/23 0430  NA 133* 133*  K 3.9 3.4*  CL 99 99  CO2 23 24  GLUCOSE 231* 156*  BUN 10 16  CREATININE 1.15* 1.20*  CALCIUM  8.2* 8.0*   PT/INR Recent Labs    12/08/23 0436  LABPROT 16.3*  INR 1.2   CMP     Component Value Date/Time   NA 133 (L) 12/10/2023 0430   NA 139 11/20/2022 0930   K 3.4 (L) 12/10/2023 0430   CL 99 12/10/2023 0430   CO2 24 12/10/2023 0430   GLUCOSE 156 (H) 12/10/2023 0430   BUN 16 12/10/2023 0430   BUN 12 11/20/2022 0930   CREATININE 1.20 (H) 12/10/2023 0430   CALCIUM  8.0 (L) 12/10/2023 0430   PROT 6.3 (L) 12/10/2023 0430   PROT 7.0 11/20/2022 0930   ALBUMIN 2.1 (L) 12/10/2023 0430   ALBUMIN 4.3 11/20/2022 0930   AST 48 (H) 12/10/2023 0430   ALT 23 12/10/2023 0430   ALKPHOS 55 12/10/2023 0430   BILITOT 0.6 12/10/2023 0430   BILITOT 0.4  11/20/2022 0930   GFRNONAA >60 12/10/2023 0430   GFRAA 127 11/03/2019 1152   Lipase     Component Value Date/Time   LIPASE 25 12/04/2022 1357       Studies/Results: CT Angio Chest Pulmonary Embolism (PE) W or WO Contrast Addendum Date: 12/09/2023 ADDENDUM REPORT: 12/09/2023 15:33 ADDENDUM: Critical Value/emergent results were called by telephone at the time of interpretation on 12/09/2023 at 3:06 p.m. to provider DELON PRUDE , who verbally acknowledged these results. Electronically Signed   By: Toribio Agreste M.D.   On: 12/09/2023 15:33   Result Date: 12/09/2023 CLINICAL DATA:  Dyspnea with recent surgery. Possible pulmonary embolism. EXAM: CT ANGIOGRAPHY CHEST WITH CONTRAST TECHNIQUE: Multidetector CT imaging of the chest was performed using the standard protocol during bolus administration of intravenous contrast. Multiplanar CT image reconstructions and MIPs were obtained to evaluate the vascular anatomy. RADIATION DOSE REDUCTION: This exam was performed according to the departmental dose-optimization program which includes automated exposure control, adjustment of the mA and/or kV according to patient size and/or use of iterative reconstruction technique. CONTRAST:  75mL OMNIPAQUE  IOHEXOL  350 MG/ML SOLN COMPARISON:  None Available. FINDINGS: Cardiovascular: Heart is normal size. Thoracic aorta is normal in caliber. Pulmonary arterial system is adequately opacified and demonstrates emboli over a subsegmental  left lower lobar artery. There are also emboli over the proximal right upper/middle lobe arteries. RV/LV ratio is 3.8/4.0 = 0.95. Remaining vascular structures are unremarkable. Mediastinum/Nodes: No mediastinal or hilar adenopathy. Remaining mediastinal structures are normal. Lungs/Pleura: Lungs are adequately inflated with bibasilar linear atelectasis. Mild linear atelectasis over the right upper lobe. No effusion. Airways are normal. Upper Abdomen: No acute findings over the visualized  upper abdomen. Musculoskeletal: No focal bony abnormality. Oval 2.5 cm mass with a few coarse calcifications over the medial right breast. Review of the MIP images confirms the above findings. IMPRESSION: 1. Evidence of pulmonary emboli over the proximal right upper/middle lobe arteries and subsegmental left lower lobar artery. No evidence of right heart strain. 2. Bibasilar and right upper lobe linear atelectasis. 3. 2.5 cm oval mass with a few coarse calcifications over the medial right breast. Recommend diagnostic mammographic correlation. Paging ordering physician. Electronically Signed: By: Toribio Agreste M.D. On: 12/09/2023 13:39    Anti-infectives: Anti-infectives (From admission, onward)    Start     Dose/Rate Route Frequency Ordered Stop   12/08/23 0845  cefOXitin (MEFOXIN) 2 g in sodium chloride  0.9 % 100 mL IVPB        2 g 200 mL/hr over 30 Minutes Intravenous On call to O.R. 12/08/23 0758 12/08/23 1424   12/06/23 2000  cefTRIAXone  (ROCEPHIN ) 2 g in sodium chloride  0.9 % 100 mL IVPB        2 g 200 mL/hr over 30 Minutes Intravenous Every 24 hours 12/06/23 0723 12/11/23 1959   12/02/23 1800  doxycycline  (VIBRAMYCIN ) 100 mg in sodium chloride  0.9 % 250 mL IVPB        100 mg 125 mL/hr over 120 Minutes Intravenous Every 12 hours 12/02/23 1736     12/02/23 1800  metroNIDAZOLE  (FLAGYL ) IVPB 500 mg        500 mg 100 mL/hr over 60 Minutes Intravenous Every 12 hours 12/02/23 1736     12/01/23 2000  cefTRIAXone  (ROCEPHIN ) 2 g in sodium chloride  0.9 % 100 mL IVPB        2 g 200 mL/hr over 30 Minutes Intravenous Every 24 hours 12/01/23 1920 12/05/23 2052   12/01/23 1300  piperacillin-tazobactam (ZOSYN) IVPB 3.375 g        3.375 g 100 mL/hr over 30 Minutes Intravenous  Once 12/01/23 1259 12/01/23 1420       Assessment/Plan  POD#2 ex lap,  LOA, removal of left cyst wall for TOA 10/20 Dr. Ozan; intra-op general surgery consult to evaluate the rectosigmoid junction without evidence of colon  injury. - afebrile, sinus tachycardia, HTN - continue drains, no current evidence of colonic injury - Reg diet at present - hep gtt for PE, DVT study pending - CCS will follow     LOS: 9 days   I reviewed nursing notes, hospitalist notes, last 24 h vitals and pain scores, last 48 h intake and output, last 24 h labs and trends, and last 24 h imaging results.  This care required straight-forward level of medical decision making.   Almarie Pringle, PA-C [facility] Please see Amion for pager number during day hours 7:00am-4:30pm

## 2023-12-10 NOTE — TOC Initial Note (Signed)
 Transition of Care [facility]) - Initial/Assessment Note    Patient Details  Name: Misty Walsh MRN: [id number] Date of Birth: 1990/07/21  Transition of Care [facility]) CM/SW Contact:    Charlene Julian Daring, RN Phone Number:[phone] 12/10/2023, 3:30 PM  Clinical Narrative:                   POD#2 ex lap,  LOA, removal of left cyst wall for TOA 10/20 Dr. Ozan; intra-op general surgery consult to evaluate the rectosigmoid junction without evidence of colon injury    CM met with patient in room and verified demographics.  Patient lives with her child that has special needs she shares with CM. 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 Her son has Cap C and is in wheelchair per patient. Currently father of the child caring for him and will be able to provide transportation for patient or her father she shared after discharge.  Patient has PCP with [facility]. Patient had referral for rolling walker and CM called [employer] Jermaine with referral and he had his company deliver to patient's room to take home. Patient has no insurance and referral was sent to Mckinley Mosses with Financial Counseling  Match provided through [facility] [employer] to cover discharge medications except narcotics. Patient is aware and provider and TOC will provide dc meds to patient's room prior to discharge.     Expected Discharge Plan and Services Dc home with rolling walker/2 - 5 inch wheels  MATCH- provided by CM and TOC [facility] notified  Prior Living Arrangements/Services Lives in apartment with child   Activities of Daily Living   ADL Screening (condition at time of admission) Independently performs ADLs?: Yes (appropriate for developmental age) Is the patient deaf or have difficulty hearing?: No Does the patient have difficulty seeing, even when wearing glasses/contacts?: No Does the patient have difficulty concentrating, remembering, or  making decisions?: No   Admission diagnosis:  Diarrhea of presumed infectious origin [R19.7] Dysuria [R30.0] SIRS (systemic inflammatory response syndrome) (HCC) [R65.10] Nausea without vomiting [R11.0] Sepsis due to gram-negative UTI (HCC) [A41.50, N39.0] Patient Active Problem List   Diagnosis Date Noted   Sepsis (HCC) 12/09/2023   AKI (acute kidney injury) 12/09/2023   Acute hypoxic respiratory failure (HCC) 12/09/2023   S/P exploratory laparotomy 12/09/2023   Tubo-ovarian abscess 12/02/2023   Acute pyelonephritis 12/02/2023   BMI 50.0-59.9, adult (HCC) 12/02/2023   Obesity, class 3 (HCC) 12/02/2023   Sepsis due to gram-negative UTI (HCC) 12/01/2023   Metabolic dysfunction-associated steatotic liver disease (MASLD) 12/26/2022   Class 3 severe obesity with serious comorbidity and body mass index (BMI) of 50.0 to 59.9 in adult (HCC) 11/20/2022   Physically inactive 11/20/2022   Cyst of right ovary 06/05/2022   Episodic cluster headache, not intractable 04/09/2022   Hypoventilation associated with obesity syndrome (HCC) 04/09/2022   Sleep-related headache 04/09/2022   Loud snoring 04/09/2022   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/2022   Anxiety and depression 04/09/2022   Adnexal mass 03/20/2022   Type 2 diabetes mellitus without complication, without long-term current use of insulin  (HCC) 02/08/2021   Symptomatic mammary hypertrophy 12/07/2019   Back pain 12/07/2019   Neck pain 12/07/2019   Prediabetes 11/04/2019   Hyperlipemia 11/04/2019   Vitamin D  deficiency 11/04/2019   Primary hypertension 03/22/2019   Hemorrhagic ovarian cyst 06/21/2017   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   Cellulitis  01/06/2013   History of cesarean section, classical 12/18/2012   Allergy 10/29/2012   PCP:  Buck Search, PA-C Pharmacy:   [id number] - [place], [place] - [address] AT [facility] OF [address] [place]  [place] [address] Phone: [phone] Fax: [phone]  [id number] [place], [place] - [address] AT [facility] [address] & [facility] [address] [place] [address] Phone: [phone] Fax: [phone]  [id number] - [place], [place] - [address] AT [facility] [address] [place] [address] Phone: [phone] Fax: [phone]     Social Drivers of Health (SDOH) Social History: SDOH Screenings   Food Insecurity: No Food Insecurity (12/01/2023)  Housing: Low Risk  (12/01/2023)  Transportation Needs: No Transportation Needs (12/01/2023)  Utilities: Not At Risk (12/01/2023)  Depression (PHQ2-9): Low Risk  (08/15/2022)  Financial Resource Strain: Not on File (06/07/2021)   Received from [facility]  Physical Activity: Not on File (06/07/2021)   Received from [facility]  Social Connections: Not on File (11/04/2022)   Received from [facility]  Stress: Not on File (06/07/2021)   Received from [facility]  Tobacco Use: Medium Risk (12/08/2023)   SDOH Interventions: Food Insecurity Interventions: Intervention Not Indicated, Inpatient TOC Housing Interventions: Intervention Not Indicated, Inpatient TOC Transportation Interventions: Intervention Not Indicated, Inpatient TOC Utilities Interventions: Intervention Not Indicated, Inpatient TOC   Readmission Risk Interventions     No data to display

## 2023-12-10 NOTE — Progress Notes (Signed)
 PHARMACY - ANTICOAGULATION CONSULT NOTE  Pharmacy Consult for Heparin Indication: pulmonary embolus  Allergies  Allergen Reactions   Macrobid  [Nitrofurantoin  Macrocrystal] Itching    Caused the patient to feel hot.    Patient Measurements: Height: 5' 4 (162.6 cm) Weight: 136.1 kg (300 lb) IBW/kg (Calculated) : 54.7 HEPARIN DW (KG): 88.7  Vital Signs: Temp: 98.8 F (37.1 C) (10/21 2345) Temp Source: Oral (10/21 2345) BP: 145/78 (10/21 2345) Pulse Rate: 96 (10/21 2345)  Labs: Recent Labs    12/07/23 1356 12/08/23 0436 12/09/23 0444 12/09/23 2342  HGB  --  10.2* 10.4*  --   HCT  --  31.4* 31.6*  --   PLT  --  254 404*  --   LABPROT  --  16.3*  --   --   INR  --  1.2  --   --   HEPARINUNFRC  --   --   --  0.49  CREATININE 1.06* 1.14* 1.15*  --     Estimated Creatinine Clearance: 95 mL/min (A) (by C-G formula based on SCr of 1.15 mg/dL (H)).   Medical History: Past Medical History:  Diagnosis Date   Anxiety    Back pain    Chlamydia    Depression med made her navel itching and  made her sleepy so she quit taking them   Diabetes mellitus without complication (HCC)    Edema, lower extremity    Elevated cholesterol    Fibrocystic breast changes of both breasts    GERD (gastroesophageal reflux disease)    History of cesarean section, classical 12/18/2012   2014    Human papilloma virus    Hypertension    Insomnia    Joint pain    Moderate dysplasia of cervix    Numbness    right arm to hand   Ovarian cyst    Prediabetes    Tubo-ovarian abscess 12/02/2023   Vitamin D  deficiency      Assessment: 33yo F presented with sepsis 2/2 TOA. Initially treated with prophylactic lovenox, but anticoagulation held x24h prior to procedure on 10/20. Now patient noting dyspnea and CT chest reveals evidence of pulmonary embolism. Pharmacy consulted to transition from prophylactic lovenox to treatment dose heparin for treatment of new PE.  UFH level taken ~7 hours from  start of bolus = 0.49  Goal of Therapy:  Heparin level 0.3-0.7 units/ml Monitor platelets by anticoagulation protocol: Yes   Plan:  Continue heparin infusion at 1500 units/hr Check UFH level and CBC daily while on heparin Continue to monitor H&H and platelets  Rosina DEL Kaysin Brock 12/10/2023,4:27 AM

## 2023-12-11 ENCOUNTER — Ambulatory Visit: Payer: Self-pay | Admitting: Obstetrics & Gynecology

## 2023-12-11 ENCOUNTER — Other Ambulatory Visit: Payer: Self-pay | Admitting: Obstetrics and Gynecology

## 2023-12-11 DIAGNOSIS — I2690 Septic pulmonary embolism without acute cor pulmonale: Secondary | ICD-10-CM

## 2023-12-11 DIAGNOSIS — I824Y3 Acute embolism and thrombosis of unspecified deep veins of proximal lower extremity, bilateral: Secondary | ICD-10-CM

## 2023-12-11 LAB — BPAM RBC
Blood Product Expiration Date: 202511122359
Blood Product Expiration Date: 202511152359
Unit Type and Rh: 5100
Unit Type and Rh: 5100

## 2023-12-11 LAB — COMPREHENSIVE METABOLIC PANEL WITH GFR
ALT: 25 U/L (ref 0–44)
AST: 47 U/L — ABNORMAL HIGH (ref 15–41)
Albumin: 2.2 g/dL — ABNORMAL LOW (ref 3.5–5.0)
Alkaline Phosphatase: 49 U/L (ref 38–126)
Anion gap: 14 (ref 5–15)
BUN: 12 mg/dL (ref 6–20)
CO2: 26 mmol/L (ref 22–32)
Calcium: 8.4 mg/dL — ABNORMAL LOW (ref 8.9–10.3)
Chloride: 96 mmol/L — ABNORMAL LOW (ref 98–111)
Creatinine, Ser: 0.99 mg/dL (ref 0.44–1.00)
GFR, Estimated: >60 mL/min (ref >60)
Glucose, Bld: 154 mg/dL — ABNORMAL HIGH (ref 70–99)
Potassium: 3.4 mmol/L — ABNORMAL LOW (ref 3.5–5.1)
Sodium: 136 mmol/L (ref 135–145)
Total Bilirubin: 0.5 mg/dL (ref 0.0–1.2)
Total Protein: 6.7 g/dL (ref 6.5–8.1)

## 2023-12-11 LAB — IRON AND TIBC
Iron: 67 ug/dL (ref 28–170)
Saturation Ratios: 32 % — ABNORMAL HIGH (ref 10.4–31.8)
TIBC: 207 ug/dL — ABNORMAL LOW (ref 250–450)
UIBC: 140 ug/dL

## 2023-12-11 LAB — TYPE AND SCREEN
ABO/RH(D): O POS
Antibody Screen: NEGATIVE
Unit division: 0
Unit division: 0

## 2023-12-11 LAB — AEROBIC CULTURE W GRAM STAIN (SUPERFICIAL SPECIMEN)
Culture: NO GROWTH
Gram Stain: NONE SEEN

## 2023-12-11 LAB — CBC
HCT: 27.1 % — ABNORMAL LOW (ref 36.0–46.0)
Hemoglobin: 8.7 g/dL — ABNORMAL LOW (ref 12.0–15.0)
MCH: 30.5 pg (ref 26.0–34.0)
MCHC: 32.1 g/dL (ref 30.0–36.0)
MCV: 95.1 fL (ref 80.0–100.0)
Platelets: 366 K/uL (ref 150–400)
RBC: 2.85 MIL/uL — ABNORMAL LOW (ref 3.87–5.11)
RDW: 13.2 % (ref 11.5–15.5)
WBC: 20.2 K/uL — ABNORMAL HIGH (ref 4.0–10.5)
nRBC: 0.1 % (ref 0.0–0.2)

## 2023-12-11 LAB — RETICULOCYTES
Immature Retic Fract: 34.5 % — ABNORMAL HIGH (ref 2.3–15.9)
RBC.: 2.84 MIL/uL — ABNORMAL LOW (ref 3.87–5.11)
Retic Count, Absolute: 71.3 K/uL (ref 19.0–186.0)
Retic Ct Pct: 2.5 % (ref 0.4–3.1)

## 2023-12-11 LAB — HEPARIN LEVEL (UNFRACTIONATED)
Heparin Unfractionated: 0.39 [IU]/mL (ref 0.30–0.70)
Heparin Unfractionated: 0.33 [IU]/mL (ref 0.30–0.70)

## 2023-12-11 LAB — GLUCOSE, CAPILLARY
Glucose-Capillary: 161 mg/dL — ABNORMAL HIGH (ref 70–99)
Glucose-Capillary: 188 mg/dL — ABNORMAL HIGH (ref 70–99)
Glucose-Capillary: 145 mg/dL — ABNORMAL HIGH (ref 70–99)

## 2023-12-11 LAB — FOLATE: Folate: 8.8 ng/mL (ref >5.9)

## 2023-12-11 LAB — VITAMIN B12: Vitamin B-12: 544 pg/mL (ref 180–914)

## 2023-12-11 LAB — FERRITIN: Ferritin: 688 ng/mL — ABNORMAL HIGH (ref 11–307)

## 2023-12-11 MED ORDER — APIXABAN 5 MG PO TABS
5.0000 mg | ORAL_TABLET | Freq: Two times a day (BID) | ORAL | Status: DC
Start: 2023-12-18 — End: 2023-12-15

## 2023-12-11 MED ORDER — POTASSIUM CHLORIDE CRYS ER 20 MEQ PO TBCR
40.0000 meq | EXTENDED_RELEASE_TABLET | Freq: Once | ORAL | Status: DC
  Filled 2023-12-11: qty 2

## 2023-12-11 MED ORDER — APIXABAN 5 MG PO TABS
10.0000 mg | ORAL_TABLET | Freq: Two times a day (BID) | ORAL | Status: DC
  Administered 2023-12-11 – 2023-12-14 (×8): 10 mg via ORAL
  Filled 2023-12-11 (×9): qty 2

## 2023-12-11 MED ORDER — ONDANSETRON 4 MG PO TBDP: 4.0000 mg | ORAL_TABLET | Freq: Four times a day (QID) | ORAL | Status: DC | PRN

## 2023-12-11 MED ORDER — METOCLOPRAMIDE HCL 10 MG PO TABS
5.0000 mg | ORAL_TABLET | Freq: Three times a day (TID) | ORAL | Status: DC
  Administered 2023-12-11 – 2023-12-23 (×29): 5 mg via ORAL
  Filled 2023-12-11 (×33): qty 1

## 2023-12-11 MED ORDER — SODIUM CHLORIDE 0.9 % IV SOLN
INTRAVENOUS | Status: AC | PRN
  Administered 2023-12-12: 250 mL via INTRAVENOUS

## 2023-12-11 NOTE — TOC Progression Note (Signed)
 Transition of Care [facility]) - Progression Note    Patient Details  Name: Misty Walsh MRN: [id number] Date of Birth: [birth year]  Transition [facility] Care [facility]) CM/SW Contact  Charlene Julian Daring, RN Phone Number:[phone] 12/11/2023, 4:39 PM  Clinical Narrative:     CM received a message from the PT today that patient would benefit from a bariatric rolling walker. [employer] delivered a rolling walker yesterday to room  regular size based on order and recommendations yesterday. CM called [employer] and requested a bariatric rolling walker. Jermaine shared he would switch it out .  Social Drivers of Health (SDOH) Interventions SDOH Screenings   Food Insecurity: No Food Insecurity (12/01/2023)  Housing: Low Risk  (12/01/2023)  Transportation Needs: No Transportation Needs (12/01/2023)  Utilities: Not At Risk (12/01/2023)  Depression (PHQ2-9): Low Risk  (08/15/2022)  Financial Resource Strain: Not on File (06/07/2021)   Received from [facility]  Physical Activity: Not on File (06/07/2021)   Received from [facility]  Social Connections: Not on File (11/04/2022)   [facility]  Stress: Not on File (06/07/2021)   Received from [facility]  Tobacco Use: Medium Risk (12/08/2023)    Readmission Risk Interventions     No data to display

## 2023-12-11 NOTE — Progress Notes (Signed)
 Physical Therapy Treatment Patient Details Name: Misty Walsh MRN: [id number] DOB: [birth year] Today's Date: 12/11/2023   History of Present Illness Pt is 33 year old presented to [facility] on  12/01/23 for nausea and abd pain. Found to have tubo-ovarian abscess. Transferred to [facility] on 12/04/23. Pt underwent exploratory lap, lysis of adhesions and removal of lt cyst wall on 12/08/23. Imaging 10/22 revealed acute PE. Heparin initiated. 10/22 US  revealed BLE DVT. PMH - DM, morbid obesity, HTN    PT Comments  Pt in bathroom with nursing on arrival for toileting and bathing. Pt reporting fatigue but agreeable to participation in PT. She required min assist transfers, and supervision amb 91' with RW. Pt in recliner at end of session. PT to continue effort to maximize mobility during hospitalization.     If plan is discharge home, recommend the following: Assistance with cooking/housework;Assist for transportation   Can travel by private vehicle        Equipment Recommendations  Rolling walker (2 wheels) (bariatric)    Recommendations for Other Services       Precautions / Restrictions Precautions Precautions: Other (comment) Recall of Precautions/Restrictions: Intact Precaution/Restrictions Comments: abd drains     Mobility  Bed Mobility               General bed mobility comments: Pt in bathroom with nursing on arrival.    Transfers Overall transfer level: Needs assistance Equipment used: Rolling walker (2 wheels) Transfers: Sit to/from Stand Sit to Stand: Min assist           General transfer comment: increased time, cues for hand placement    Ambulation/Gait Ambulation/Gait assistance: Supervision Gait Distance (Feet): 65 Feet Assistive device: Rolling walker (2 wheels) Gait Pattern/deviations: Step-through pattern, Decreased step length - right, Decreased step length - left, Decreased stride length, Wide base of support Gait velocity: decreased Gait velocity  interpretation: <1.31 ft/sec, indicative of household ambulator   General Gait Details: distance limited by fatigue. Pt just finished washing up in bathroom with nursing prior to PT arrival.   Stairs             Wheelchair Mobility     Tilt Bed    Modified Rankin (Stroke Patients Only)       Balance Overall balance assessment: No apparent balance deficits (not formally assessed)                                          Communication Communication Communication: No apparent difficulties  Cognition Arousal: Alert Behavior During Therapy: Flat affect   PT - Cognitive impairments: No apparent impairments                         Following commands: Intact      Cueing Cueing Techniques: Verbal cues  Exercises      General Comments General comments (skin integrity, edema, etc.): Pt placed on RA just prior to amb to bathroom with RN. SpO2 91-93% during mobility on RA. 96% resting on RA. Max HR 110.      Pertinent Vitals/Pain Pain Assessment Pain Assessment: Faces Faces Pain Scale: Hurts even more Pain Location: abdomen Pain Descriptors / Indicators: Grimacing, Aching, Operative site guarding Pain Intervention(s): Limited activity within patient's tolerance, Monitored during session, Repositioned, RN gave pain meds during session    Home Living  Prior Function            PT Goals (current goals can now be found in the care plan section) Acute Rehab PT Goals Patient Stated Goal: home Progress towards PT goals: Progressing toward goals    Frequency    Min 3X/week      PT Plan      Co-evaluation              AM-PAC PT 6 Clicks Mobility   Outcome Measure  Help needed turning from your back to your side while in a flat bed without using bedrails?: A Little Help needed moving from lying on your back to sitting on the side of a flat bed without using bedrails?: A Little Help  needed moving to and from a bed to a chair (including a wheelchair)?: A Little Help needed standing up from a chair using your arms (e.g., wheelchair or bedside chair)?: A Little Help needed to walk in hospital room?: A Little Help needed climbing 3-5 steps with a railing? : A Little 6 Click Score: 18    End of Session   Activity Tolerance: Patient tolerated treatment well Patient left: in chair;with call bell/phone within reach Nurse Communication: Mobility status PT Visit Diagnosis: Muscle weakness (generalized) (M62.81);Difficulty in walking, not elsewhere classified (R26.2);Pain     Time: 8859-8788 PT Time Calculation (min) (ACUTE ONLY): 31 min  Charges:    $Gait Training: 23-37 mins PT General Charges $$ ACUTE PT VISIT: 1 Visit                     Misty MATSU., PT  Office # [phone]    Erven Misty Shaker 12/11/2023, 12:41 PM

## 2023-12-11 NOTE — Progress Notes (Signed)
 PHARMACY - ANTICOAGULATION CONSULT NOTE  Pharmacy Consult for heparin Indication: pulmonary embolus and DVT  Allergies  Allergen Reactions   Macrobid  [Nitrofurantoin  Macrocrystal] Itching    Caused the patient to feel hot.    Patient Measurements: Height: 5' 4 (162.6 cm) Weight: 136.1 kg (300 lb) IBW/kg (Calculated) : 54.7 HEPARIN DW (KG): 88.7  Vital Signs: Temp: 99.3 F (37.4 C) (10/22 1951) Temp Source: Oral (10/22 1951) BP: 159/95 (10/23 0428) Pulse Rate: 94 (10/23 0428)  Labs: Recent Labs    12/09/23 0444 12/09/23 2342 12/10/23 0430 12/10/23 0641 12/10/23 1524 12/11/23 0057 12/11/23 0443  HGB 10.4*  --  8.7*  --   --  8.7*  --   HCT 31.6*  --  27.2*  --   --  27.1*  --   PLT 404*  --  348  --   --  366  --   HEPARINUNFRC  --    < >  --    < > 0.18* 0.39 0.33  CREATININE 1.15*  --  1.20*  --   --  0.99  --    < > = values in this interval not displayed.    Estimated Creatinine Clearance: 110.3 mL/min (by C-G formula based on SCr of 0.99 mg/dL).   Medical History: Past Medical History:  Diagnosis Date   Anxiety    Back pain    Chlamydia    Depression med made her navel itching and  made her sleepy so she quit taking them   Diabetes mellitus without complication (HCC)    Edema, lower extremity    Elevated cholesterol    Fibrocystic breast changes of both breasts    GERD (gastroesophageal reflux disease)    History of cesarean section, classical 12/18/2012   2014    Human papilloma virus    Hypertension    Insomnia    Joint pain    Moderate dysplasia of cervix    Numbness    right arm to hand   Ovarian cyst    Prediabetes    Tubo-ovarian abscess 12/02/2023   Vitamin D  deficiency     Medications:  Scheduled:   acetaminophen   650 mg Oral Q6H   atorvastatin  80 mg Oral Daily   chlorthalidone   25 mg Oral Daily   gabapentin  300 mg Oral TID   insulin  aspart  0-20 Units Subcutaneous TID WC   insulin  aspart  0-5 Units Subcutaneous QHS    insulin  glargine-yfgn  6 Units Subcutaneous Daily   metoCLOPramide  (REGLAN ) injection  5 mg Intravenous Q8H   pantoprazole   40 mg Oral Daily   potassium chloride   40 mEq Oral Once   Infusions:   doxycycline  (VIBRAMYCIN ) IV 100 mg (12/11/23 0525)   famotidine  (PEPCID ) IV 20 mg (12/10/23 2259)   heparin 2,000 Units/hr (12/11/23 0517)   metronidazole  500 mg (12/10/23 2144)   promethazine  (PHENERGAN ) injection (IM or IVPB) 12.5 mg (12/10/23 1249)    Assessment: 33yo F presented with sepsis 2/2 TOA. Initially treated with prophylactic lovenox, but anticoagulation held x24h prior to procedure on 10/20. Patient noted dyspnea and CT chest revealed evidence of pulmonary embolism. Pharmacy consulted for treatment dose heparin for treatment of new PE and RLE/LLE DVTs. Per MD notes, no N/V noted this morning and may try to transition to PO anticoagulation if continues to have no N/V. Confirmed with RN patient is not having N/V at this time.  Repeat heparin level continues to remain therapeutic at 0.33 this AM, approximately 4  hours after previous heparin level. Platelets and Hgb/Hct remain stable at 366 k/uL and 8.7 g/dL/27.1%, respectively.    Goal of Therapy:  Heparin level 0.3-0.7 units/ml Monitor platelets by anticoagulation protocol: Yes   Plan:  Heparin level drawn a little earlier than 6-8 hours after previous level but given is still therapeutic and planning to transition to Eliquis today, will hold off on repeating level now. However, if patient does not transition to Eliquis today, will plan to obtain repeat heparin level sooner (6-8 hours after last level) to ensure patient is staying therapeutic. Continue heparin drip at 2000 units/hr. When switching from heparin drip to Eliquis, will plan to start first Eliquis dose at time when heparin drip stopped. Continue to monitor H&H and platelets  [place] Zahari Fazzino 12/11/2023,7:48 AM

## 2023-12-11 NOTE — Plan of Care (Signed)

## 2023-12-11 NOTE — Progress Notes (Signed)
 [facility] Progress Note  3 Days Post-Op  Interval: Reports bowel movements, no N/V.  Objective: Vital signs in last 24 hours: Temp:  [98.1 F (36.7 C)-99.3 F (37.4 C)] 99.3 F (37.4 C) (10/22 1951) Pulse Rate:  [94-103] 94 (10/23 0428) Resp:  [19-22] 20 (10/23 0428) BP: (139-159)/(70-95) 159/95 (10/23 0428) SpO2:  [92 %-99 %] 97 % (10/22 2355) Last BM Date : 12/10/23  Intake/Output from previous day: 10/22 0701 - 10/23 0700 In: 1781.7 [P.O.:360; I.V.:416.9; IV Piggyback:1004.9] Out: 885 [Urine:850; Drains:35] Intake/Output this shift: No intake/output data recorded.  PE: Gen:  Alert, NAD, cooperative,  Card:  sinus tachycardia Pulm:  Normal effort on Boaz Abd: Soft, appropriately tender, honeycomb in place, JP drains are serosanguinous.  Skin: warm and dry, no rashes  Psych: A&Ox3   Lab Results:  Recent Labs    12/10/23 0430 12/11/23 0057  WBC 22.1* 20.2*  HGB 8.7* 8.7*  HCT 27.2* 27.1*  PLT 348 366   BMET Recent Labs    12/10/23 0430 12/11/23 0057  NA 133* 136  K 3.4* 3.4*  CL 99 96*  CO2 24 26  GLUCOSE 156* 154*  BUN 16 12  CREATININE 1.20* 0.99  CALCIUM  8.0* 8.4*   PT/INR No results for input(s): LABPROT, INR in the last 72 hours.  CMP     Component Value Date/Time   NA 136 12/11/2023 0057   NA 139 11/20/2022 0930   K 3.4 (L) 12/11/2023 0057   CL 96 (L) 12/11/2023 0057   CO2 26 12/11/2023 0057   GLUCOSE 154 (H) 12/11/2023 0057   BUN 12 12/11/2023 0057   BUN 12 11/20/2022 0930   CREATININE 0.99 12/11/2023 0057   CALCIUM  8.4 (L) 12/11/2023 0057   PROT 6.7 12/11/2023 0057   PROT 7.0 11/20/2022 0930   ALBUMIN 2.2 (L) 12/11/2023 0057   ALBUMIN 4.3 11/20/2022 0930   AST 47 (H) 12/11/2023 0057   ALT 25 12/11/2023 0057   ALKPHOS 49 12/11/2023 0057   BILITOT 0.5 12/11/2023 0057   BILITOT 0.4 11/20/2022 0930   GFRNONAA >60 12/11/2023 0057   GFRAA 127 11/03/2019 1152   Lipase     Component Value Date/Time   LIPASE 25  12/04/2022 1357       Studies/Results: VAS US  LOWER EXTREMITY VENOUS (DVT) Result Date: 12/10/2023  Lower Venous DVT Study Patient Name:  Misty Walsh  Date of Exam:   12/10/2023 Medical Rec #: [id number]         Accession #:    [id number] Date of Birth: 20-Apr-1990         Patient Gender: F Patient Age:   33 years Exam Location:  [facility] Procedure:      VAS US  LOWER EXTREMITY VENOUS (DVT) Referring Phys: DUFFY AL-SULTANI --------------------------------------------------------------------------------  Indications: Pulmonary embolism, Pain, and Post-op.  Limitations: Body habitus, poor ultrasound/tissue interface and bandages. Comparison Study: No prior exam. Performing Technologist: Edilia Elden Appl  Examination Guidelines: A complete evaluation includes B-mode imaging, spectral Doppler, color Doppler, and power Doppler as needed of all accessible portions of each vessel. Bilateral testing is considered an integral part of a complete examination. Limited examinations for reoccurring indications may be performed as noted. The reflux portion of the exam is performed with the patient in reverse Trendelenburg.  +---------+---------------+---------+-----------+----------+--------------+ RIGHT    CompressibilityPhasicitySpontaneityPropertiesThrombus Aging +---------+---------------+---------+-----------+----------+--------------+ CFV      Partial        Yes      Yes                                 +---------+---------------+---------+-----------+----------+--------------+  SFJ      Full           Yes      Yes                                 +---------+---------------+---------+-----------+----------+--------------+ FV Prox  Partial        Yes      Yes                                 +---------+---------------+---------+-----------+----------+--------------+ FV Mid   Full                                                         +---------+---------------+---------+-----------+----------+--------------+ FV DistalFull                                                        +---------+---------------+---------+-----------+----------+--------------+ PFV      Partial        Yes      Yes                                 +---------+---------------+---------+-----------+----------+--------------+ POP      Full           Yes      Yes                                 +---------+---------------+---------+-----------+----------+--------------+ PTV      Full                                                        +---------+---------------+---------+-----------+----------+--------------+ PERO     Partial        Yes      Yes                                 +---------+---------------+---------+-----------+----------+--------------+ EIV                     Yes      Yes                                 +---------+---------------+---------+-----------+----------+--------------+ Deep vein thrombosis noted in the right common femoral vein at the saphenofemoral junction, proximal femoral, profunda and in one of the paired peroneal veins. No thrombus noted in the great saphenous.  +---------+---------------+---------+-----------+----------+--------------+ LEFT     CompressibilityPhasicitySpontaneityPropertiesThrombus Aging +---------+---------------+---------+-----------+----------+--------------+ CFV      Full           Yes      Yes                                 +---------+---------------+---------+-----------+----------+--------------+  SFJ      Full           Yes      Yes                                 +---------+---------------+---------+-----------+----------+--------------+ FV Prox  Full                                                        +---------+---------------+---------+-----------+----------+--------------+ FV Mid   Full                                                         +---------+---------------+---------+-----------+----------+--------------+ FV DistalFull                                                        +---------+---------------+---------+-----------+----------+--------------+ PFV      Full                                                        +---------+---------------+---------+-----------+----------+--------------+ POP      Full           Yes      Yes                                 +---------+---------------+---------+-----------+----------+--------------+ PTV      Partial        No       No                                  +---------+---------------+---------+-----------+----------+--------------+ PERO     Partial        No       No                                  +---------+---------------+---------+-----------+----------+--------------+ Deep vein thrombosis noted in one of the paired posterior tibial veins and one of the paired peroneal veins.    Summary: RIGHT: - Findings consistent with acute deep vein thrombosis involving the right common femoral vein, SF junction, right femoral vein, right proximal profunda vein, and right peroneal veins.  - No cystic structure found in the popliteal fossa.  LEFT: - Findings consistent with acute deep vein thrombosis involving the left posterior tibial veins, and left peroneal veins.  - No cystic structure found in the popliteal fossa.  *See table(s) above for measurements and observations. Electronically signed by Gaile New MD on 12/10/2023 at 7:28:04 PM.    Final    CT Angio Chest Pulmonary Embolism (PE) W or WO Contrast Addendum Date: 12/09/2023 ADDENDUM REPORT:  12/09/2023 15:33 ADDENDUM: Critical Value/emergent results were called by telephone at the time of interpretation on 12/09/2023 at 3:06 p.m. to provider DELON PRUDE , who verbally acknowledged these results. Electronically Signed   By: Toribio Agreste M.D.   On: 12/09/2023 15:33   Result Date: 12/09/2023 CLINICAL  DATA:  Dyspnea with recent surgery. Possible pulmonary embolism. EXAM: CT ANGIOGRAPHY CHEST WITH CONTRAST TECHNIQUE: Multidetector CT imaging of the chest was performed using the standard protocol during bolus administration of intravenous contrast. Multiplanar CT image reconstructions and MIPs were obtained to evaluate the vascular anatomy. RADIATION DOSE REDUCTION: This exam was performed according to the departmental dose-optimization program which includes automated exposure control, adjustment of the mA and/or kV according to patient size and/or use of iterative reconstruction technique. CONTRAST:  75mL OMNIPAQUE  IOHEXOL  350 MG/ML SOLN COMPARISON:  None Available. FINDINGS: Cardiovascular: Heart is normal size. Thoracic aorta is normal in caliber. Pulmonary arterial system is adequately opacified and demonstrates emboli over a subsegmental left lower lobar artery. There are also emboli over the proximal right upper/middle lobe arteries. RV/LV ratio is 3.8/4.0 = 0.95. Remaining vascular structures are unremarkable. Mediastinum/Nodes: No mediastinal or hilar adenopathy. Remaining mediastinal structures are normal. Lungs/Pleura: Lungs are adequately inflated with bibasilar linear atelectasis. Mild linear atelectasis over the right upper lobe. No effusion. Airways are normal. Upper Abdomen: No acute findings over the visualized upper abdomen. Musculoskeletal: No focal bony abnormality. Oval 2.5 cm mass with a few coarse calcifications over the medial right breast. Review of the MIP images confirms the above findings. IMPRESSION: 1. Evidence of pulmonary emboli over the proximal right upper/middle lobe arteries and subsegmental left lower lobar artery. No evidence of right heart strain. 2. Bibasilar and right upper lobe linear atelectasis. 3. 2.5 cm oval mass with a few coarse calcifications over the medial right breast. Recommend diagnostic mammographic correlation. Paging ordering physician. Electronically Signed:  By: Toribio Agreste M.D. On: 12/09/2023 13:39    Anti-infectives: Anti-infectives (From admission, onward)    Start     Dose/Rate Route Frequency Ordered Stop   12/08/23 0845  cefOXitin (MEFOXIN) 2 g in sodium chloride  0.9 % 100 mL IVPB        2 g 200 mL/hr over 30 Minutes Intravenous On call to O.R. 12/08/23 0758 12/08/23 1424   12/06/23 2000  cefTRIAXone  (ROCEPHIN ) 2 g in sodium chloride  0.9 % 100 mL IVPB        2 g 200 mL/hr over 30 Minutes Intravenous Every 24 hours 12/06/23 0723 12/10/23 2142   12/02/23 1800  doxycycline  (VIBRAMYCIN ) 100 mg in sodium chloride  0.9 % 250 mL IVPB        100 mg 125 mL/hr over 120 Minutes Intravenous Every 12 hours 12/02/23 1736     12/02/23 1800  metroNIDAZOLE  (FLAGYL ) IVPB 500 mg        500 mg 100 mL/hr over 60 Minutes Intravenous Every 12 hours 12/02/23 1736     12/01/23 2000  cefTRIAXone  (ROCEPHIN ) 2 g in sodium chloride  0.9 % 100 mL IVPB        2 g 200 mL/hr over 30 Minutes Intravenous Every 24 hours 12/01/23 1920 12/05/23 2052   12/01/23 1300  piperacillin-tazobactam (ZOSYN) IVPB 3.375 g        3.375 g 100 mL/hr over 30 Minutes Intravenous  Once 12/01/23 1259 12/01/23 1420       Assessment/Plan  POD#3 ex lap,  LOA, removal of left cyst wall for TOA 10/20 Dr. Ozan; intra-op general surgery consult to  evaluate the rectosigmoid junction without evidence of colon injury. - afebrile, sinus tachycardia improving, HTN - continue drains, no current evidence of colonic injury - Reg diet at present - hep gtt for PE, DVT study positive for RLE and LLE DVTs - CCS will follow    LOS: 10 days   I reviewed nursing notes, hospitalist notes, last 24 h vitals and pain scores, last 48 h intake and output, last 24 h labs and trends, and last 24 h imaging results.  This care required straight-forward level of medical decision making.   Orie Silversmith, MD [facility]

## 2023-12-11 NOTE — Progress Notes (Signed)
 Gynecology Progress Note  Admission Date: 12/01/2023 Current Date: 12/11/2023 2:10 PM  Misty Walsh is a 33 y.o. G1P0101 POD#3 after exploratory laparotomy for pelvic abscess.  History complicated by: Patient Active Problem List   Diagnosis Date Noted   Sepsis (HCC) 12/09/2023   AKI (acute kidney injury) 12/09/2023   Acute hypoxic respiratory failure (HCC) 12/09/2023   S/P exploratory laparotomy 12/09/2023   Tubo-ovarian abscess 12/02/2023   Acute pyelonephritis 12/02/2023   BMI 50.0-59.9, adult (HCC) 12/02/2023   Obesity, class 3 (HCC) 12/02/2023   Sepsis due to gram-negative UTI (HCC) 12/01/2023   Metabolic dysfunction-associated steatotic liver disease (MASLD) 12/26/2022   Class 3 severe obesity with serious comorbidity and body mass index (BMI) of 50.0 to 59.9 in adult (HCC) 11/20/2022   Physically inactive 11/20/2022   Cyst of right ovary 06/05/2022   Episodic cluster headache, not intractable 04/09/2022   Hypoventilation associated with obesity syndrome (HCC) 04/09/2022   Sleep-related headache 04/09/2022   Loud snoring 04/09/2022   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/2022   Anxiety and depression 04/09/2022   Adnexal mass 03/20/2022   Type 2 diabetes mellitus without complication, without long-term current use of insulin  (HCC) 02/08/2021   Symptomatic mammary hypertrophy 12/07/2019   Back pain 12/07/2019   Neck pain 12/07/2019   Prediabetes 11/04/2019   Hyperlipemia 11/04/2019   Vitamin D  deficiency 11/04/2019   Primary hypertension 03/22/2019   Hemorrhagic ovarian cyst 06/21/2017   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  She is doing well.  Pt is passing gas and has had at least 2 bowel moments.  Her pain is well controlled currently.  Internal medicine was present and was discussing her DVT and PE.  She has been converted to eliquis at this time.  Objective:   Vitals:   12/11/23 0832 12/11/23 0835 12/11/23 1146 12/11/23 1231  BP: (!) 146/84     Pulse: 92     Resp: (!) 22     Temp: 98.4 F (36.9 C)     TempSrc: Oral     SpO2: 97% 95% 92% 95%  Weight:      Height:        Temp:  [98.4 F (36.9 C)-99.3 F (37.4 C)] 98.4 F (36.9 C) (10/23 0832) Pulse Rate:  [92-103] 92 (10/23 0832) Resp:  [19-22] 22 (10/23 0832) BP: (146-159)/(84-95) 146/84 (10/23 0832) SpO2:  [92 %-99 %] 95 % (10/23 1231) I/O last 3 completed shifts: In: 3027.1 [P.O.:600; I.V.:672.2; IV Piggyback:1754.9] Out: 1515 [Urine:1450; Drains:65] Total I/O In: -  Out: 850 [Urine:850]  Intake/Output Summary (Last 24 hours) at 12/11/2023 1410 Last data filed at 12/11/2023 1248 Gross per 24 hour  Intake 1781.71 ml  Output 1335 ml  Net 446.71 ml     Current Vital Signs 24h Vital Sign Ranges  T 98.4 F (36.9 C) Temp  Avg: 98.9 F (37.2 C)  Min: 98.4 F (36.9 C)  Max: 99.3 F (37.4 C)  BP (!) 146/84 BP  Min: 146/84  Max: 159/95  HR 92 Pulse  Avg: 96  Min: 92  Max: 103  RR (!) 22 Resp  Avg: 21  Min: 19  Max: 22  SaO2 95 %  Room Air SpO2  Avg: 95.9 %  Min: 92 %  Max: 99 %       24 Hour I/O Current Shift I/O  Time Ins Outs 10/22 0701 - 10/23 0700 In: 1781.7 [P.O.:360; I.V.:416.9] Out: 885 [Urine:850; Drains:35] 10/23 0701 - 10/23 1900 In: -  Out: 850 [Urine:850]   Patient Vitals for the past 12 hrs:  BP Temp Temp src Pulse Resp SpO2  12/11/23 1231 -- -- -- -- -- 95 %  12/11/23 1146 -- -- -- -- -- 92 %  12/11/23 0835 -- -- -- -- -- 95 %  12/11/23 0832 (!) 146/84 98.4 F (36.9 C) Oral 92 (!) 22 97 %  12/11/23 0428 (!) 159/95 -- -- 94 20 --     Patient Vitals for the past 24 hrs:  BP Temp Temp src Pulse Resp  SpO2  12/11/23 1231 -- -- -- -- -- 95 %  12/11/23 1146 -- -- -- -- -- 92 %  12/11/23 0835 -- -- -- -- -- 95 %  12/11/23 0832 (!) 146/84 98.4 F (36.9 C) Oral 92 (!) 22 97 %  12/11/23 0428 (!) 159/95 -- -- 94 20 --  12/10/23 2355 (!) 158/86 -- -- 96 (!) 22 97 %  12/10/23 2111 -- -- -- -- -- 96 %  12/10/23 1951 (!) 152/90 99.3 F (37.4 C) Oral 95 (!) 22 92 %  12/10/23 1800 -- -- -- -- -- 95 %  12/10/23 1700 -- -- -- -- -- 97 %  12/10/23 1618 (!) 151/92 -- -- (!) 103 19 98 %  12/10/23 1600 -- -- -- -- -- 99 %  12/10/23 1500 -- -- -- -- -- 98 %    Physical exam: General appearance: alert, cooperative, appears stated age, no distress, and morbidly obese Abdomen: soft, non-tender; bowel sounds normal; no masses,  no organomegaly GU: No gross VB Lungs: clear to auscultation bilaterally Heart: regular rate and rhythm Extremities: no lower extremity edema Skin: midline incision appears appropriate, JP drain x 2 was noted with serosanguinous fluid in both Psych: appropriate Neurologic: Grossly normal  Medications Current Facility-Administered Medications  Medication Dose Route Frequency Provider Last Rate Last Admin   0.9 %  sodium chloride  infusion   Intravenous PRN Zina Jerilynn LABOR, MD 10 mL/hr at 12/11/23 0841 New Bag at 12/11/23 0841   acetaminophen  (TYLENOL ) tablet 650 mg  650 mg Oral Q6H Ozan, Jennifer, DO   650 mg at 12/11/23 1249   alum & mag hydroxide-simeth (MAALOX/MYLANTA) 200-200-20 MG/5ML suspension 30 mL  30 mL Oral Q4H PRN Jadine Toribio SQUIBB, MD   30 mL at 12/03/23 0321   apixaban (ELIQUIS) tablet 10 mg  10 mg Oral BID Al-Sultani, Anmar, MD   10 mg at 12/11/23 1251   Followed by   NOREEN ON 12/18/2023] apixaban (ELIQUIS) tablet 5 mg  5 mg Oral BID Al-Sultani, Anmar, MD       atorvastatin (LIPITOR) tablet 80 mg  80 mg Oral Daily Lue Elsie BROCKS, MD   80 mg at 12/11/23 1027   chlorthalidone  (HYGROTON ) tablet 25 mg  25 mg Oral Daily Lue Elsie BROCKS, MD   25 mg at  12/11/23 1027   diphenhydrAMINE  (BENADRYL ) injection 50 mg  50 mg Intravenous Q8H PRN Ozan, Jennifer, DO   50 mg at 12/09/23 1652   doxycycline  (VIBRAMYCIN ) 100 mg in sodium chloride  0.9 % 250 mL IVPB  100 mg Intravenous Q12H Jadine Toribio SQUIBB, MD 125 mL/hr at 12/11/23 0525 100 mg at  12/11/23 0525   famotidine  (PEPCID ) IVPB 20 mg premix  20 mg Intravenous BID Ozan, Jennifer, DO 100 mL/hr at 12/11/23 1030 20 mg at 12/11/23 1030   gabapentin (NEURONTIN) capsule 300 mg  300 mg Oral TID Ozan, Jennifer, DO   300 mg at 12/11/23 1026   hydrALAZINE (APRESOLINE) injection 5 mg  5 mg Intravenous Q6H PRN Jadine Toribio SQUIBB, MD   5 mg at 12/04/23 9090   insulin  aspart (novoLOG) injection 0-20 Units  0-20 Units Subcutaneous TID WC Arlon Carliss ORN, DO   4 Units at 12/10/23 1259   insulin  aspart (novoLOG) injection 0-5 Units  0-5 Units Subcutaneous QHS Arlon Carliss W, DO       insulin  glargine-yfgn [facility]) injection 6 Units  6 Units Subcutaneous Daily Arlon Carliss ORN, DO   6 Units at 12/11/23 1027   loperamide (IMODIUM) capsule 2 mg  2 mg Oral PRN Arlon Carliss ORN, DO   2 mg at 12/08/23 9048   menthol  (CEPACOL) lozenge 3 mg  1 lozenge Oral PRN Ozan, Jennifer, DO   3 mg at 12/10/23 2053   metoCLOPramide  (REGLAN ) tablet 5 mg  5 mg Oral Q8H Zina Jerilynn LABOR, MD   5 mg at 12/11/23 1336   metroNIDAZOLE  (FLAGYL ) IVPB 500 mg  500 mg Intravenous Q12H Jadine Toribio SQUIBB, MD 100 mL/hr at 12/11/23 0841 500 mg at 12/11/23 0841   ondansetron  (ZOFRAN -ODT) disintegrating tablet 4 mg  4 mg Oral Q6H PRN Zina Jerilynn LABOR, MD       Oral care mouth rinse  15 mL Mouth Rinse PRN Jadine Toribio SQUIBB, MD       oxyCODONE  (Oxy IR/ROXICODONE ) immediate release tablet 5-10 mg  5-10 mg Oral Q4H PRN Lue Elsie BROCKS, MD   10 mg at 12/11/23 1336   pantoprazole  (PROTONIX ) EC tablet 40 mg  40 mg Oral Daily Lue Elsie BROCKS, MD   40 mg at 12/11/23 1026   potassium chloride  SA (KLOR-CON  M) CR tablet 40 mEq  40 mEq Oral Once  Al-Sultani, Anmar, MD       promethazine  (PHENERGAN ) 12.5 mg in sodium chloride  0.9 % 50 mL IVPB  12.5 mg Intravenous Q6H PRN Arlon Carliss ORN, DO 150 mL/hr at 12/10/23 1249 12.5 mg at 12/10/23 1249   zolpidem  (AMBIEN ) tablet 5 mg  5 mg Oral QHS PRN Cleatus Moccasin, MD   5 mg at 12/06/23 2123      Labs  Recent Labs  Lab 12/09/23 0444 12/10/23 0430 12/11/23 0057  WBC 32.3* 22.1* 20.2*  HGB 10.4* 8.7* 8.7*  HCT 31.6* 27.2* 27.1*  PLT 404* 348 366    Recent Labs  Lab 12/09/23 0444 12/10/23 0430 12/11/23 0057  NA 133* 133* 136  K 3.9 3.4* 3.4*  CL 99 99 96*  CO2 23 24 26   BUN 10 16 12   CREATININE 1.15* 1.20* 0.99  CALCIUM  8.2* 8.0* 8.4*  PROT 6.4* 6.3* 6.7  BILITOT 0.6 0.6 0.5  ALKPHOS 58 55 49  ALT 20 23 25   AST 33 48* 47*  GLUCOSE 231* 156* 154*    Radiology N/a  Assessment & Plan:  POD 3 s/p exploratory laparotomy and evacuation of abdominal fluid collections. *GYN: continue abx, doxycycline  and flagyl  *Pain: well controlled *FEN/GI: continue regular diet, discontinued phenergan  and converted reglan  and zofran  to oral meds *PPx: pt on eliquis, medicine recommended 3 months of eliquis Ambulatory referral for heme/onc sent for follow up Abdominal binder ordered for support Continue physical therapy to maintain mobility *  Dispo: continue inpatient recovery, follow WBC trend for improvement   Code Status: Full Code  Jerilynn Buddle, MD Attending [facility] for [facility])

## 2023-12-11 NOTE — Progress Notes (Signed)
 Progress Note   Patient: Misty Walsh FMW:[id number] DOB: [birth year] DOA: 12/01/2023     10 DOS: the patient was seen and examined on 12/11/2023   Assessment and Plan:  #Acute PE #Acute bilateral DVTs - Noted to be tachypneic with worsening dyspnea.  Found to have acute PE. - CTA chest (12/10/2023) showed PE over the proximal right upper/middle lobe arteries and subsegmental left lower lobar artery with no evidence of right heart strain. - Etiology unclear, likely multifactorial given significant immobility, obesity, acute illness - Bilateral LE duplex showed acute bilateral DVTs with proximal involvement on the right - Vascular surgery consulted due to proximal right DVT - spoke to Dr. Magda and Dr. Pearline who indicated no surgical intervention at this time,only  anticoagulation - Heparin drip discontinued on 10/23 - Transitioned to treatment dose Eliquis 10 mg BID for 7 days then 5 mg BID - Recommend at least 3 months of anticoagulation for likely provoked DVT given significantly reduced mobility. May benefit from referral to outpatient hematology for further delineation of appropriate length of anticoagulation  #Acute hypoxic respiratory failure - improving - Noted to be tachypneic with worsening dyspnea and was ultimately found to have acute PE, possibly complicated further by OHS - Continues to feel short of breath, although improved - SpO2 98% on 2L [place], wean as tolerated  #Sepsis secondary to large tubo-ovarian abscess - Presented with fever, leukocytosis, noted to have large tubo-ovarian abscess - Blood cultures (10/13) negative - Afebrile, leukocytosis improving - On Rocephin , doxycycline , and Flagyl  for TOA as below  # Large tubo-ovarian abscess status post ex lap w/ washout - POD 3 - OBGYN following - Analgesia -Tylenol , gabapentin, PRN oxycodone  and Dilaudid . Recommend avoiding NSAIDs given AKI - As needed antiemetics - Surgical pathology showed benign cyst wall  with cyst lining dilatation.  Negative for malignancy - Continue Rocephin , doxycycline , and Flagyl  - Encouraged OOB - Incentive spirometry  # AKI - Etiology likely multifactorial - initially decreased PO intake and sepsis, now post IV contrast exposure and Lasix  - Baseline Cr ~ 0.9.  - Improved to 0.99 today from 1.20 yesterday.  - Avoiding NSAIDs - Strict I/Os  # Bilateral lower extremity swelling - Patient reports her legs are more swollen than normal, initially thought due to fluid overload and was receiving Lasix, without much improvement - However, now found to have acute bilateral DVTs, likely the main driver of her BLE swelling - Will continue to monitor and hold off additional Lasix at this time  # Right breast mass - Incidental finding of 2.5 cm oval mass with few coarse calcifications over the medial right breast was noted on CTA chest - Recommend follow-up with PCP or OB/GYN outpatient diagnostic mammography  #T2DM - Hgb A1c 6.3 (12/02/2023), well-controlled at home - Received dexamethasone  intraoperatively and had postop hyperglycemia - Continue glargine 6 units daily (Started on 10/21) - SSI  # Hypokalemia - K3.4 - On KCl 20 mEq packets twice daily - noted to have refused last two doses. Discontinued packets - Ordered KCl 40 mEq pills   # HLD - Continue Lipitor 80 mg daily  #Hypertension - Continue chlorthalidone  25 mg daily  #Debility - PT/OT following  #Class III / Morbid Obesity (BMI Body mass index is 51.49 kg/m. kg/m) - Obesity is clinically significant and contributes to T2DM and HTN.  - Discussed lifestyle modification including dietary changes, regular physical activity, and weight reduction strategies.       Subjective: Patient seen at bedside this  morning. Patient reports getting up and moving with PT. Discussed need for significant lifestyle changes and increased mobility as that is likely playing a part in the development of VTEs. She denies  N/V today but reports poor appetite and PO intake. Pain more tolerable. Denies any chest pain, worsening SOB, fevers, chills.   Physical Exam: Vitals:   12/10/23 1951 12/10/23 2111 12/10/23 2355 12/11/23 0428  BP: (!) 152/90  (!) 158/86 (!) 159/95  Pulse: 95  96 94  Resp: (!) 22  (!) 22 20  Temp: 99.3 F (37.4 C)     TempSrc: Oral     SpO2: 92% 96% 97%   Weight:      Height:       Physical Exam Constitutional:      General: She is not in acute distress.    Appearance: She is obese. She is not ill-appearing.     Interventions: Nasal cannula in place.  HENT:     Mouth/Throat:     Mouth: Mucous membranes are moist.  Cardiovascular:     Rate and Rhythm: Normal rate and regular rhythm.     Heart sounds: Normal heart sounds. No murmur heard. Pulmonary:     Effort: Pulmonary effort is normal. No respiratory distress.     Comments: Appears to be currently comfortable on 2L [place]. Unable to evaluate posterior lung fields Abdominal:     General: There is no distension.     Palpations: Abdomen is soft.     Tenderness: There is abdominal tenderness (Appropriately TTP lower abdomen). There is no guarding.  Genitourinary:    Comments: JP drains x 2 in place, minimal serosanguineous fluid Musculoskeletal:     Right lower leg: No edema.     Left lower leg: No edema.  Skin:    General: Skin is warm and dry.     Capillary Refill: Capillary refill takes less than 2 seconds.  Neurological:     Mental Status: She is alert and oriented to person, place, and time. Mental status is at baseline.     Family Communication: None  Disposition: Status is: Inpatient Remains inpatient appropriate because: Requiring IV antibiotics for TOA, persistent nausea and vomiting, AKI, and acute PE  Planned Discharge Destination: Home    DVT Prophylaxis: Heparin drip   Time spent: 50 minutes  Author: Duffy Larch, MD 12/11/2023 7:13 AM  For on call review [email or web address].

## 2023-12-11 NOTE — Progress Notes (Signed)
Orders placed for hematology referral.

## 2023-12-11 NOTE — Progress Notes (Signed)
 PHARMACY - ANTICOAGULATION CONSULT NOTE  Pharmacy Consult for apixaban Indication: pulmonary embolus and DVT  Allergies  Allergen Reactions   Macrobid  [Nitrofurantoin  Macrocrystal] Itching    Caused the patient to feel hot.    Patient Measurements: Height: 5' 4 (162.6 cm) Weight: 136.1 kg (300 lb) IBW/kg (Calculated) : 54.7 HEPARIN DW (KG): 88.7  Vital Signs: Temp: 98.4 F (36.9 C) (10/23 0832) Temp Source: Oral (10/23 0832) BP: 146/84 (10/23 0832) Pulse Rate: 92 (10/23 0832)  Labs: Recent Labs    12/09/23 0444 12/09/23 2342 12/10/23 0430 12/10/23 0641 12/10/23 1524 12/11/23 0057 12/11/23 0443  HGB 10.4*  --  8.7*  --   --  8.7*  --   HCT 31.6*  --  27.2*  --   --  27.1*  --   PLT 404*  --  348  --   --  366  --   HEPARINUNFRC  --    < >  --    < > 0.18* 0.39 0.33  CREATININE 1.15*  --  1.20*  --   --  0.99  --    < > = values in this interval not displayed.    Estimated Creatinine Clearance: 110.3 mL/min (by C-G formula based on SCr of 0.99 mg/dL).   Medical History: Past Medical History:  Diagnosis Date   Anxiety    Back pain    Chlamydia    Depression med made her navel itching and  made her sleepy so she quit taking them   Diabetes mellitus without complication (HCC)    Edema, lower extremity    Elevated cholesterol    Fibrocystic breast changes of both breasts    GERD (gastroesophageal reflux disease)    History of cesarean section, classical 12/18/2012   2014    Human papilloma virus    Hypertension    Insomnia    Joint pain    Moderate dysplasia of cervix    Numbness    right arm to hand   Ovarian cyst    Prediabetes    Tubo-ovarian abscess 12/02/2023   Vitamin D  deficiency     Medications:  Scheduled:   acetaminophen   650 mg Oral Q6H   apixaban  10 mg Oral BID   Followed by   NOREEN ON 12/18/2023] apixaban  5 mg Oral BID   atorvastatin  80 mg Oral Daily   chlorthalidone   25 mg Oral Daily   gabapentin  300 mg Oral TID   insulin   aspart  0-20 Units Subcutaneous TID WC   insulin  aspart  0-5 Units Subcutaneous QHS   insulin  glargine-yfgn  6 Units Subcutaneous Daily   metoCLOPramide  (REGLAN ) injection  5 mg Intravenous Q8H   pantoprazole   40 mg Oral Daily   potassium chloride   40 mEq Oral Once   Infusions:   sodium chloride  10 mL/hr at 12/11/23 0841   doxycycline  (VIBRAMYCIN ) IV 100 mg (12/11/23 0525)   famotidine  (PEPCID ) IV 20 mg (12/11/23 1030)   heparin 2,000 Units/hr (12/11/23 0517)   metronidazole  500 mg (12/11/23 0841)   promethazine  (PHENERGAN ) injection (IM or IVPB) 12.5 mg (12/10/23 1249)    Assessment: 33yo F presented with sepsis 2/2 TOA. Initially treated with prophylactic lovenox, but anticoagulation held x24h prior to procedure on 10/20. Patient noted dyspnea and CT chest revealed evidence of pulmonary embolism. Patient has been on heparin drip for new PE and RLE/LLE DVTs. RN states patient with no N/V this AM and able to tolerate PO medications this AM.  Pharmacy consulted for transition from heparin drip to Eliquis for treatment of DVTs/PE. Patient renal function good (CrCl 110 ml/min). Platelets (366) and Hgb/Hct (8.7/27.1) stable.   Goal of Therapy:  Monitor platelets by anticoagulation protocol: Yes   Plan:  Will plan to stop heparin drip 12/11/23 at 1300 and give first dose of Eliquis at that time. Plan to start Eliquis 10mg  bid x7 days and then transition to 5mg  bid thereafter. Likely duration of therapy is 3 months per MD note based on provoked DVT/PE.   Misty Walsh 12/11/2023,12:07 PM

## 2023-12-11 NOTE — Progress Notes (Signed)
 PHARMACY - ANTICOAGULATION CONSULT NOTE  Pharmacy Consult for Heparin Indication: pulmonary embolus  Allergies  Allergen Reactions   Macrobid  [Nitrofurantoin  Macrocrystal] Itching    Caused the patient to feel hot.    Patient Measurements: Height: 5' 4 (162.6 cm) Weight: 136.1 kg (300 lb) IBW/kg (Calculated) : 54.7 HEPARIN DW (KG): 88.7  Vital Signs: Temp: 99.3 F (37.4 C) (10/22 1951) Temp Source: Oral (10/22 1951) BP: 158/86 (10/22 2355) Pulse Rate: 96 (10/22 2355)  Labs: Recent Labs    12/08/23 0436 12/09/23 0444 12/09/23 2342 12/10/23 0430 12/10/23 0641 12/10/23 1524 12/11/23 0057  HGB 10.2* 10.4*  --  8.7*  --   --  8.7*  HCT 31.4* 31.6*  --  27.2*  --   --  27.1*  PLT 254 404*  --  348  --   --  366  LABPROT 16.3*  --   --   --   --   --   --   INR 1.2  --   --   --   --   --   --   HEPARINUNFRC  --   --    < >  --  0.24* 0.18* 0.39  CREATININE 1.14* 1.15*  --  1.20*  --   --   --    < > = values in this interval not displayed.    Estimated Creatinine Clearance: 91 mL/min (A) (by C-G formula based on SCr of 1.2 mg/dL (H)).   Medical History: Past Medical History:  Diagnosis Date   Anxiety    Back pain    Chlamydia    Depression med made her navel itching and  made her sleepy so she quit taking them   Diabetes mellitus without complication (HCC)    Edema, lower extremity    Elevated cholesterol    Fibrocystic breast changes of both breasts    GERD (gastroesophageal reflux disease)    History of cesarean section, classical 12/18/2012   2014    Human papilloma virus    Hypertension    Insomnia    Joint pain    Moderate dysplasia of cervix    Numbness    right arm to hand   Ovarian cyst    Prediabetes    Tubo-ovarian abscess 12/02/2023   Vitamin D  deficiency     Assessment: 33yo F presented with sepsis 2/2 TOA. Initially treated with prophylactic lovenox, but anticoagulation held x24h prior to procedure on 10/20. Now patient noting dyspnea  and CT chest reveals evidence of pulmonary embolism. Pharmacy consulted to transition from prophylactic lovenox to treatment dose heparin for treatment of new PE.  Previous two heparin levels were subtherapeutic, boluses were given and continuous infusion increased.   Heparin level 0.39 (therapeutic). Hemoglobin stable at 8.7, platelets stable at 366.   Goal of Therapy:  Heparin level 0.3-0.7 units/ml Monitor platelets by anticoagulation protocol: Yes   Plan:  Continue heparin infusion at 2000 units/hr Check UFH level and CBC daily while on heparin - levels to be collected 10/23 @ 0500 with morning labs Monitor H&H, platelets and ability to transition to oral treatment options   Misty Walsh 12/11/2023,1:23 AM

## 2023-12-12 LAB — COMPREHENSIVE METABOLIC PANEL WITH GFR
ALT: 23 U/L (ref 0–44)
AST: 42 U/L — ABNORMAL HIGH (ref 15–41)
Albumin: 2.2 g/dL — ABNORMAL LOW (ref 3.5–5.0)
Alkaline Phosphatase: 46 U/L (ref 38–126)
Anion gap: 12 (ref 5–15)
BUN: 9 mg/dL (ref 6–20)
CO2: 25 mmol/L (ref 22–32)
Calcium: 8.6 mg/dL — ABNORMAL LOW (ref 8.9–10.3)
Chloride: 96 mmol/L — ABNORMAL LOW (ref 98–111)
Creatinine, Ser: 1.04 mg/dL — ABNORMAL HIGH (ref 0.44–1.00)
GFR, Estimated: >60 mL/min (ref >60)
Glucose, Bld: 165 mg/dL — ABNORMAL HIGH (ref 70–99)
Potassium: 3 mmol/L — ABNORMAL LOW (ref 3.5–5.1)
Sodium: 133 mmol/L — ABNORMAL LOW (ref 135–145)
Total Bilirubin: 0.5 mg/dL (ref 0.0–1.2)
Total Protein: 6.4 g/dL — ABNORMAL LOW (ref 6.5–8.1)

## 2023-12-12 LAB — CBC
HCT: 26.5 % — ABNORMAL LOW (ref 36.0–46.0)
Hemoglobin: 8.6 g/dL — ABNORMAL LOW (ref 12.0–15.0)
MCH: 30.8 pg (ref 26.0–34.0)
MCHC: 32.5 g/dL (ref 30.0–36.0)
MCV: 95 fL (ref 80.0–100.0)
Platelets: 394 K/uL (ref 150–400)
RBC: 2.79 MIL/uL — ABNORMAL LOW (ref 3.87–5.11)
RDW: 13 % (ref 11.5–15.5)
WBC: 15.4 K/uL — ABNORMAL HIGH (ref 4.0–10.5)
nRBC: 0.1 % (ref 0.0–0.2)

## 2023-12-12 LAB — GLUCOSE, CAPILLARY
Glucose-Capillary: 154 mg/dL — ABNORMAL HIGH (ref 70–99)
Glucose-Capillary: 154 mg/dL — ABNORMAL HIGH (ref 70–99)
Glucose-Capillary: 179 mg/dL — ABNORMAL HIGH (ref 70–99)
Glucose-Capillary: 192 mg/dL — ABNORMAL HIGH (ref 70–99)
Glucose-Capillary: 234 mg/dL — ABNORMAL HIGH (ref 70–99)

## 2023-12-12 MED ORDER — ONDANSETRON HCL 4 MG PO TABS
4.0000 mg | ORAL_TABLET | Freq: Four times a day (QID) | ORAL | Status: DC | PRN
  Administered 2023-12-12 – 2023-12-14 (×5): 4 mg via ORAL
  Filled 2023-12-12 (×5): qty 1

## 2023-12-12 MED ORDER — LOSARTAN POTASSIUM 25 MG PO TABS
25.0000 mg | ORAL_TABLET | Freq: Every day | ORAL | Status: DC
  Administered 2023-12-12: 25 mg via ORAL
  Filled 2023-12-12: qty 1

## 2023-12-12 MED ORDER — POTASSIUM CHLORIDE 10 MEQ/100ML IV SOLN
10.0000 meq | INTRAVENOUS | Status: AC
  Administered 2023-12-12 (×4): 10 meq via INTRAVENOUS
  Filled 2023-12-12 (×4): qty 100

## 2023-12-12 MED ORDER — SCOPOLAMINE 1 MG/3DAYS TD PT72
1.0000 | MEDICATED_PATCH | TRANSDERMAL | Status: DC
  Administered 2023-12-12 – 2023-12-22 (×4): 1 mg via TRANSDERMAL
  Filled 2023-12-12 (×4): qty 1

## 2023-12-12 NOTE — Progress Notes (Signed)
 Progress Note   Patient: Misty Walsh FMW:[id number] DOB: January 01, 1991 DOA: 12/01/2023     11 DOS: the patient was seen and examined on 12/12/2023   Assessment and Plan:  #Acute PE #Acute bilateral DVTs - Noted to be tachypneic with worsening dyspnea.  Found to have acute PE. - CTA chest (12/10/2023) showed PE over the proximal right upper/middle lobe arteries and subsegmental left lower lobar artery with no evidence of right heart strain. - Etiology unclear, likely multifactorial given significant immobility, obesity, acute illness - Bilateral LE duplex showed acute bilateral DVTs with proximal involvement on the right - Vascular surgery consulted due to proximal right DVT - spoke to Dr. Magda and Dr. Pearline who indicated no surgical intervention at this time,only  anticoagulation - Heparin drip discontinued on 10/23 - Transitioned on 10/23 to treatment dose Eliquis 10 mg BID for 7 days  then 5 mg BID - Recommend at least 3 months of anticoagulation for likely provoked DVT given significantly reduced mobility. May benefit from referral to outpatient hematology for further delineation of appropriate length of anticoagulation  #Acute hypoxic respiratory failure - improving - Noted to be tachypneic with worsening dyspnea and was ultimately found to have acute PE, possibly complicated further by OHS - Continues to feel short of breath, although improved - SpO2 98% on 2L [place], wean as tolerated  #Sepsis secondary to large tubo-ovarian abscess - Presented with fever, leukocytosis, noted to have large tubo-ovarian abscess - Blood cultures (10/13) negative - Afebrile, leukocytosis improving - Completed 5 days of rocephin  - On doxycycline  and Flagyl  for TOA as below  # Large tubo-ovarian abscess status post ex lap w/ washout - POD 4 - OBGYN following - Analgesia -Tylenol , gabapentin, PRN oxycodone  and Dilaudid . Recommend avoiding NSAIDs given AKI - As needed antiemetics - Surgical  pathology showed benign cyst wall with cyst lining dilatation.  Negative for malignancy - Completed 5 days of Rocephin  - Continue doxycycline  and Flagyl  for total 14 days post-op - Encouraged OOB - Incentive spirometry  # AKI - improving - Etiology likely multifactorial - initially decreased PO intake and sepsis, now post IV contrast exposure and Lasix  - Baseline Cr ~ 0.9.  - Stable at 1.04 today  - Avoiding NSAIDs - Strict I/Os  # Hypokalemia - K down to 3.0 - Per MAR, patient has been unable to tolerate PO potassium supplements on 10/22 and 10/23 - IV KCl 10 mEq x 4 ordered  #Hypertension - SBP in the 150s - Continue chlorthalidone  25 mg daily - Start Losartan 25 mg daily  # Bilateral lower extremity swelling - Patient reports her legs are more swollen than normal, initially thought due to fluid overload and was receiving Lasix, without much improvement - However, now found to have acute bilateral DVTs, likely the main driver of her BLE swelling - Will continue to monitor and hold off additional Lasix at this time  # Right breast mass - Incidental finding of 2.5 cm oval mass with few coarse calcifications over the medial right breast was noted on CTA chest - Recommend follow-up with PCP or OB/GYN outpatient diagnostic mammography  #T2DM - Hgb A1c 6.3 (12/02/2023), well-controlled at home - Received dexamethasone  intraoperatively and had postop hyperglycemia - Continue glargine 6 units daily (Started on 10/21) - SSI  # HLD - Continue Lipitor 80 mg daily  #Debility - PT/OT following  #Class III / Morbid Obesity (BMI Body mass index is 51.49 kg/m. kg/m) - Obesity is clinically significant and contributes to  T2DM and HTN.  - Discussed lifestyle modification including dietary changes, regular physical activity, and weight reduction strategies.       Subjective: Patient seen at bedside this morning.  Reports persistent nausea.  States she thinks scopolamine  patch  would help.  Has limited her appetite.  Pain relatively controlled.  Was sitting at the side of the bed.  Reports having bowel movements.  Physical Exam: Vitals:   12/11/23 2030 12/11/23 2314 12/12/23 0408 12/12/23 0800  BP:  (!) 149/71 (!) 153/80 (!) 154/103  Pulse:  (!) 103 93 90  Resp:  18 18 14   Temp:    98.3 F (36.8 C)  TempSrc:    Oral  SpO2: 97%  98% 98%  Weight:      Height:       Physical Exam Constitutional:      General: She is not in acute distress.    Appearance: She is obese. She is not ill-appearing.     Interventions: Nasal cannula in place.  Cardiovascular:     Rate and Rhythm: Normal rate and regular rhythm.     Heart sounds: Normal heart sounds. No murmur heard. Pulmonary:     Effort: Pulmonary effort is normal. No respiratory distress.     Comments: Appears to be currently comfortable on 2L [place]. Unable to evaluate posterior lung fields Abdominal:     General: There is no distension.     Palpations: Abdomen is soft.     Tenderness: There is abdominal tenderness (Appropriately TTP lower abdomen). There is no guarding.  Genitourinary:    Comments: JP drains x 2 in place, minimal serosanguineous fluid Musculoskeletal:     Right lower leg: No edema.     Left lower leg: No edema.  Skin:    General: Skin is dry.  Neurological:     Mental Status: She is alert and oriented to person, place, and time. Mental status is at baseline.     Family Communication: None  Disposition: Status is: Inpatient Remains inpatient appropriate because: Requiring IV antibiotics for TOA, persistent nausea and vomiting, AKI, and acute PE  Planned Discharge Destination: Home    DVT Prophylaxis: apixaban (ELIQUIS) tablet 10 mg  apixaban (ELIQUIS) tablet 5 mg   Time spent: 37 minutes  Author: Cristen Murcia Al-Sultani, MD 12/12/2023 8:12 AM  For on call review [email or web address].

## 2023-12-12 NOTE — Progress Notes (Addendum)
 [facility] Progress Note  4 Days Post-Op  Interval: WBC downtrending to 15.4 from 20.2. Patient complaining of reflux and overall just not feeling well. Afebrile. Down to 1L [place]. States to be 10/10 pain but clinically does not appear to be in significant pain.  Objective: Vital signs in last 24 hours: Temp:  [98.3 F (36.8 C)-98.7 F (37.1 C)] 98.3 F (36.8 C) (10/24 0800) Pulse Rate:  [90-103] 90 (10/24 0800) Resp:  [14-18] 14 (10/24 0800) BP: (149-157)/(71-103) 154/103 (10/24 0800) SpO2:  [92 %-99 %] 99 % (10/24 0831) Last BM Date : 12/10/23  Intake/Output from previous day: 10/23 0701 - 10/24 0700 In: 1330.4 [P.O.:480; I.V.:25.7; IV Piggyback:824.7] Out: 1470 [Urine:1450; Drains:20] Intake/Output this shift: No intake/output data recorded.  PE: Gen:  Alert, NAD, cooperative,  Card:  RRR Pulm:  Normal effort on 1L [place] Abd: Soft, appropriately tender, honeycomb in place, JP drains are serosanguinous.  Skin: warm and dry, no rashes  Psych: A&Ox3   Lab Results:  Recent Labs    12/11/23 0057 12/12/23 0437  WBC 20.2* 15.4*  HGB 8.7* 8.6*  HCT 27.1* 26.5*  PLT 366 394   BMET Recent Labs    12/11/23 0057 12/12/23 0437  NA 136 133*  K 3.4* 3.0*  CL 96* 96*  CO2 26 25  GLUCOSE 154* 165*  BUN 12 9  CREATININE 0.99 1.04*  CALCIUM  8.4* 8.6*   PT/INR No results for input(s): LABPROT, INR in the last 72 hours.  CMP     Component Value Date/Time   NA 133 (L) 12/12/2023 0437   NA 139 11/20/2022 0930   K 3.0 (L) 12/12/2023 0437   CL 96 (L) 12/12/2023 0437   CO2 25 12/12/2023 0437   GLUCOSE 165 (H) 12/12/2023 0437   BUN 9 12/12/2023 0437   BUN 12 11/20/2022 0930   CREATININE 1.04 (H) 12/12/2023 0437   CALCIUM  8.6 (L) 12/12/2023 0437   PROT 6.4 (L) 12/12/2023 0437   PROT 7.0 11/20/2022 0930   ALBUMIN 2.2 (L) 12/12/2023 0437   ALBUMIN 4.3 11/20/2022 0930   AST 42 (H) 12/12/2023 0437   ALT 23 12/12/2023 0437   ALKPHOS 46 12/12/2023 0437    BILITOT 0.5 12/12/2023 0437   BILITOT 0.4 11/20/2022 0930   GFRNONAA >60 12/12/2023 0437   GFRAA 127 11/03/2019 1152   Lipase     Component Value Date/Time   LIPASE 25 12/04/2022 1357       Studies/Results: VAS US  LOWER EXTREMITY VENOUS (DVT) Result Date: 12/10/2023  Lower Venous DVT Study Patient Name:  HELANE BRICENO  Date of Exam:   12/10/2023 Medical Rec #: [id number]         Accession #:    [id number] Date of Birth: 1990/11/26         Patient Gender: F Patient Age:   33 years Exam Location:  [facility] Procedure:      VAS US  LOWER EXTREMITY VENOUS (DVT) Referring Phys: DUFFY AL-SULTANI --------------------------------------------------------------------------------  Indications: Pulmonary embolism, Pain, and Post-op.  Limitations: Body habitus, poor ultrasound/tissue interface and bandages. Comparison Study: No prior exam. Performing Technologist: Edilia Elden Appl  Examination Guidelines: A complete evaluation includes B-mode imaging, spectral Doppler, color Doppler, and power Doppler as needed of all accessible portions of each vessel. Bilateral testing is considered an integral part of a complete examination. Limited examinations for reoccurring indications may be performed as noted. The reflux portion of the exam is performed with the patient in reverse Trendelenburg.  +---------+---------------+---------+-----------+----------+--------------+  RIGHT    CompressibilityPhasicitySpontaneityPropertiesThrombus Aging +---------+---------------+---------+-----------+----------+--------------+ CFV      Partial        Yes      Yes                                 +---------+---------------+---------+-----------+----------+--------------+ SFJ      Full           Yes      Yes                                 +---------+---------------+---------+-----------+----------+--------------+ FV Prox  Partial        Yes      Yes                                  +---------+---------------+---------+-----------+----------+--------------+ FV Mid   Full                                                        +---------+---------------+---------+-----------+----------+--------------+ FV DistalFull                                                        +---------+---------------+---------+-----------+----------+--------------+ PFV      Partial        Yes      Yes                                 +---------+---------------+---------+-----------+----------+--------------+ POP      Full           Yes      Yes                                 +---------+---------------+---------+-----------+----------+--------------+ PTV      Full                                                        +---------+---------------+---------+-----------+----------+--------------+ PERO     Partial        Yes      Yes                                 +---------+---------------+---------+-----------+----------+--------------+ EIV                     Yes      Yes                                 +---------+---------------+---------+-----------+----------+--------------+ Deep vein thrombosis noted in the right common femoral vein at the saphenofemoral junction, proximal femoral, profunda and in one of the paired  peroneal veins. No thrombus noted in the great saphenous.  +---------+---------------+---------+-----------+----------+--------------+ LEFT     CompressibilityPhasicitySpontaneityPropertiesThrombus Aging +---------+---------------+---------+-----------+----------+--------------+ CFV      Full           Yes      Yes                                 +---------+---------------+---------+-----------+----------+--------------+ SFJ      Full           Yes      Yes                                 +---------+---------------+---------+-----------+----------+--------------+ FV Prox  Full                                                         +---------+---------------+---------+-----------+----------+--------------+ FV Mid   Full                                                        +---------+---------------+---------+-----------+----------+--------------+ FV DistalFull                                                        +---------+---------------+---------+-----------+----------+--------------+ PFV      Full                                                        +---------+---------------+---------+-----------+----------+--------------+ POP      Full           Yes      Yes                                 +---------+---------------+---------+-----------+----------+--------------+ PTV      Partial        No       No                                  +---------+---------------+---------+-----------+----------+--------------+ PERO     Partial        No       No                                  +---------+---------------+---------+-----------+----------+--------------+ Deep vein thrombosis noted in one of the paired posterior tibial veins and one of the paired peroneal veins.    Summary: RIGHT: - Findings consistent with acute deep vein thrombosis involving the right common femoral vein, SF junction, right femoral vein, right proximal profunda vein, and right peroneal veins.  - No cystic structure  found in the popliteal fossa.  LEFT: - Findings consistent with acute deep vein thrombosis involving the left posterior tibial veins, and left peroneal veins.  - No cystic structure found in the popliteal fossa.  *See table(s) above for measurements and observations. Electronically signed by Gaile New MD on 12/10/2023 at 7:28:04 PM.    Final     Anti-infectives: Anti-infectives (From admission, onward)    Start     Dose/Rate Route Frequency Ordered Stop   12/08/23 0845  cefOXitin (MEFOXIN) 2 g in sodium chloride  0.9 % 100 mL IVPB        2 g 200 mL/hr over 30 Minutes Intravenous On call to O.R. 12/08/23  0758 12/08/23 1424   12/06/23 2000  cefTRIAXone  (ROCEPHIN ) 2 g in sodium chloride  0.9 % 100 mL IVPB        2 g 200 mL/hr over 30 Minutes Intravenous Every 24 hours 12/06/23 0723 12/10/23 2142   12/02/23 1800  doxycycline  (VIBRAMYCIN ) 100 mg in sodium chloride  0.9 % 250 mL IVPB        100 mg 125 mL/hr over 120 Minutes Intravenous Every 12 hours 12/02/23 1736     12/02/23 1800  metroNIDAZOLE  (FLAGYL ) IVPB 500 mg        500 mg 100 mL/hr over 60 Minutes Intravenous Every 12 hours 12/02/23 1736     12/01/23 2000  cefTRIAXone  (ROCEPHIN ) 2 g in sodium chloride  0.9 % 100 mL IVPB        2 g 200 mL/hr over 30 Minutes Intravenous Every 24 hours 12/01/23 1920 12/05/23 2052   12/01/23 1300  piperacillin-tazobactam (ZOSYN) IVPB 3.375 g        3.375 g 100 mL/hr over 30 Minutes Intravenous  Once 12/01/23 1259 12/01/23 1420       Assessment/Plan  POD#4 ex lap,  LOA, removal of left cyst wall for TOA 10/20 Dr. Ozan; intra-op general surgery consult to evaluate the rectosigmoid junction without evidence of colon injury. PE with BLE DVTs - afebrile, HTN, intermittent sinus tachycardia - drains per primary, no current evidence of colonic injury - Reg diet at present - Eliquis - CCS will sign off at this time. Please call if any questions or concerns and we are happy to reevaluate.    LOS: 11 days   I reviewed nursing notes, hospitalist notes, last 24 h vitals and pain scores, last 48 h intake and output, last 24 h labs and trends, and last 24 h imaging results.  This care required straight-forward level of medical decision making.   Orie Silversmith, MD [facility]

## 2023-12-12 NOTE — Progress Notes (Signed)
 Physical Therapy Treatment Patient Details Name: Misty Walsh MRN: [id number] DOB: 09-10-1990 Today's Date: 12/12/2023   History of Present Illness Pt is 33 year old presented to [facility] on  12/01/23 for nausea and abd pain. Found to have tubo-ovarian abscess. Transferred to [facility] on 12/04/23. Pt underwent exploratory lap, lysis of adhesions and removal of lt cyst wall on 12/08/23. Imaging 10/22 revealed acute PE. Heparin initiated. 10/22 US  revealed BLE DVT. PMH - DM, morbid obesity, HTN    PT Comments  Pt in bathroom with nursing on arrival. Amb bathroom to recliner to take meds then amb in hallway. Supervision transfers, and supervision amb 150' with RW. Mobilized on RA with SpO2 91%. Max HR 116. Pt in recliner at end of session.     If plan is discharge home, recommend the following: Assistance with cooking/housework;Assist for transportation   Can travel by private vehicle        Equipment Recommendations  Rolling walker (2 wheels) (bariatric)    Recommendations for Other Services       Precautions / Restrictions Precautions Precautions: Other (comment) Recall of Precautions/Restrictions: Intact Precaution/Restrictions Comments: abd drains, watch sats     Mobility  Bed Mobility               General bed mobility comments: Pt in bathroom with nursing on arrival.    Transfers Overall transfer level: Needs assistance Equipment used: Rolling walker (2 wheels) Transfers: Sit to/from Stand Sit to Stand: Supervision           General transfer comment: increased time, cues for hand placement    Ambulation/Gait Ambulation/Gait assistance: Supervision Gait Distance (Feet): 150 Feet Assistive device: Rolling walker (2 wheels) Gait Pattern/deviations: Step-through pattern, Decreased stride length, Wide base of support Gait velocity: decreased Gait velocity interpretation: <1.31 ft/sec, indicative of household ambulator   General Gait Details: Standing rest break  after 75'. Steady gait with RW. SpO2 91% on RA. Max HR 116. 1/4 DOE noted. Educated on pursed lip breathing.   Stairs             Wheelchair Mobility     Tilt Bed    Modified Rankin (Stroke Patients Only)       Balance Overall balance assessment: No apparent balance deficits (not formally assessed)                                          Communication Communication Communication: No apparent difficulties  Cognition Arousal: Alert Behavior During Therapy: Flat affect   PT - Cognitive impairments: No apparent impairments                         Following commands: Intact      Cueing Cueing Techniques: Verbal cues  Exercises      General Comments General comments (skin integrity, edema, etc.): Pt on RA maintaining SpO2 in 90s.      Pertinent Vitals/Pain Pain Assessment Pain Assessment: Faces Faces Pain Scale: Hurts even more Pain Location: abdomen Pain Descriptors / Indicators: Grimacing, Aching, Operative site guarding Pain Intervention(s): Monitored during session, Repositioned, RN gave pain meds during session    Home Living                          Prior Function  PT Goals (current goals can now be found in the care plan section) Acute Rehab PT Goals Patient Stated Goal: home Progress towards PT goals: Progressing toward goals    Frequency    Min 3X/week      PT Plan      Co-evaluation              AM-PAC PT 6 Clicks Mobility   Outcome Measure  Help needed turning from your back to your side while in a flat bed without using bedrails?: A Little Help needed moving from lying on your back to sitting on the side of a flat bed without using bedrails?: A Little Help needed moving to and from a bed to a chair (including a wheelchair)?: A Little Help needed standing up from a chair using your arms (e.g., wheelchair or bedside chair)?: A Little Help needed to walk in hospital room?: A  Little Help needed climbing 3-5 steps with a railing? : A Little 6 Click Score: 18    End of Session   Activity Tolerance: Patient tolerated treatment well Patient left: in chair;with call bell/phone within reach Nurse Communication: Mobility status PT Visit Diagnosis: Muscle weakness (generalized) (M62.81);Difficulty in walking, not elsewhere classified (R26.2);Pain     Time: 9040-8975 PT Time Calculation (min) (ACUTE ONLY): 25 min  Charges:    $Gait Training: 23-37 mins PT General Charges $$ ACUTE PT VISIT: 1 Visit                     Sari MATSU., PT  Office # [phone]    Erven Sari Shaker 12/12/2023, 11:12 AM

## 2023-12-12 NOTE — Progress Notes (Signed)
 Gynecology Progress Note  Admission Date: 12/01/2023 Current Date: 12/12/2023 4:15 PM  Misty Walsh is a 33 y.o. G1P0101 POD# 4 after exploratory laparotomy for  pelvic abscess.  History complicated by: Patient Active Problem List   Diagnosis Date Noted   Sepsis (HCC) 12/09/2023   AKI (acute kidney injury) 12/09/2023   Acute hypoxic respiratory failure (HCC) 12/09/2023   S/P exploratory laparotomy 12/09/2023   Tubo-ovarian abscess 12/02/2023   Acute pyelonephritis 12/02/2023   BMI 50.0-59.9, adult (HCC) 12/02/2023   Obesity, class 3 (HCC) 12/02/2023   Sepsis due to gram-negative UTI (HCC) 12/01/2023   Metabolic dysfunction-associated steatotic liver disease (MASLD) 12/26/2022   Class 3 severe obesity with serious comorbidity and body mass index (BMI) of 50.0 to 59.9 in adult (HCC) 11/20/2022   Physically inactive 11/20/2022   Cyst of right ovary 06/05/2022   Episodic cluster headache, not intractable 04/09/2022   Hypoventilation associated with obesity syndrome (HCC) 04/09/2022   Sleep-related headache 04/09/2022   Loud snoring 04/09/2022   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/2022   Anxiety and depression 04/09/2022   Adnexal mass 03/20/2022   Type 2 diabetes mellitus without complication, without long-term current use of insulin  (HCC) 02/08/2021   Symptomatic mammary hypertrophy 12/07/2019   Back pain 12/07/2019   Neck pain 12/07/2019   Prediabetes 11/04/2019   Hyperlipemia 11/04/2019   Vitamin D  deficiency 11/04/2019   Primary hypertension 03/22/2019   Hemorrhagic ovarian cyst 06/21/2017   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  She has become more mobile as physical therapy was in to see her yesterday and today.  Pt denies fever/chills. She notes positive bowel movements and mild nausea.  Pt is concerned about going home due to decreased mobility and lack of help.  She also has an 30 year old son with CP that she is usually the caregiver.  Pt is doing well with eliquis.  Objective:   Vitals:   12/12/23 0408 12/12/23 0800 12/12/23 0831 12/12/23 1139  BP: (!) 153/80 (!) 154/103  (!) 157/89  Pulse: 93 90  92  Resp: 18 14  15   Temp:  98.3 F (36.8 C)  98.2 F (36.8 C)  TempSrc:  Oral  Oral  SpO2: 98% 98% 99% 96%  Weight:      Height:        Temp:  [98.2 F (36.8 C)-98.7 F (37.1 C)] 98.2 F (36.8 C) (10/24 1139) Pulse Rate:  [90-103] 92 (10/24 1139) Resp:  [14-18] 15 (10/24 1139) BP: (149-157)/(71-103) 157/89 (10/24 1139) SpO2:  [96 %-99 %] 96 % (10/24 1139) I/O last 3 completed shifts: In: 2607.3 [P.O.:840; I.V.:442.6; IV Piggyback:1324.7] Out: 1505 [Urine:1450; Drains:55] Total I/O In: 0  Out: 700 [Urine:700]  Intake/Output Summary (Last 24 hours) at 12/12/2023 1615 Last data filed at 12/12/2023 0955 Gross per 24 hour  Intake 1330.4 ml  Output 1320 ml  Net 10.4 ml     Current Vital Signs 24h Vital Sign Ranges  T 98.2 F (36.8 C) Temp  Avg: 98.4 F (36.9 C)  Min: 98.2 F (36.8 C)  Max: 98.7 F (37.1 C)  BP (!) 157/89 BP  Min: 149/71  Max: 157/89  HR 92  Pulse  Avg: 94.6  Min: 90  Max: 103  RR 15 Resp  Avg: 16.6  Min: 14  Max: 18  SaO2 96 % Room Air SpO2  Avg: 97.6 %  Min: 96 %  Max: 99 %       24 Hour I/O Current Shift I/O  Time Ins Outs 10/23 0701 - 10/24 0700 In: 1330.4 [P.O.:480; I.V.:25.7] Out: 1470 [Urine:1450; Drains:20] 10/24 0701 - 10/24 1900 In: 0  Out: 700 [Urine:700]   Patient Vitals for the past 12 hrs:  BP Temp Temp src Pulse Resp SpO2  12/12/23 1139 (!) 157/89 98.2 F (36.8 C) Oral 92 15 96 %  12/12/23 0831 -- -- -- -- -- 99 %  12/12/23 0800 (!) 154/103 98.3  F (36.8 C) Oral 90 14 98 %     Patient Vitals for the past 24 hrs:  BP Temp Temp src Pulse Resp SpO2  12/12/23 1139 (!) 157/89 98.2 F (36.8 C) Oral 92 15 96 %  12/12/23 0831 -- -- -- -- -- 99 %  12/12/23 0800 (!) 154/103 98.3 F (36.8 C) Oral 90 14 98 %  12/12/23 0408 (!) 153/80 -- -- 93 18 98 %  12/11/23 2314 (!) 149/71 -- -- (!) 103 18 --  12/11/23 2030 -- -- -- -- -- 97 %  12/11/23 2008 (!) 157/76 98.7 F (37.1 C) Oral 95 18 --    Physical exam: General appearance: alert, cooperative, appears stated age, no distress, and morbidly obese Abdomen: soft, non-tender; bowel sounds normal; no masses,  no organomegaly and JP drains x 2 noted with scant serosanguinous fluid GU: No gross VB Lungs: clear to auscultation bilaterally Heart: regular rate and rhythm Extremities: bilateral calf and ankle edema Skin: WNL Psych: appropriate Neurologic: Grossly normal  Medications Current Facility-Administered Medications  Medication Dose Route Frequency Provider Last Rate Last Admin   acetaminophen  (TYLENOL ) tablet 650 mg  650 mg Oral Q6H Ozan, Jennifer, DO   650 mg at 12/12/23 1313   alum & mag hydroxide-simeth (MAALOX/MYLANTA) 200-200-20 MG/5ML suspension 30 mL  30 mL Oral Q4H PRN Jadine Toribio SQUIBB, MD   30 mL at 12/03/23 0321   apixaban (ELIQUIS) tablet 10 mg  10 mg Oral BID Al-Sultani, Anmar, MD   10 mg at 12/12/23 1002   Followed by   NOREEN ON 12/18/2023] apixaban (ELIQUIS) tablet 5 mg  5 mg Oral BID Al-Sultani, Anmar, MD       atorvastatin (LIPITOR) tablet 80 mg  80 mg Oral Daily Lue Elsie BROCKS, MD   80 mg at 12/12/23 1002   chlorthalidone  (HYGROTON ) tablet 25 mg  25 mg Oral Daily Lue Elsie BROCKS, MD   25 mg at 12/12/23 1002   diphenhydrAMINE  (BENADRYL ) injection 50 mg  50 mg Intravenous Q8H PRN Ozan, Jennifer, DO   50 mg at 12/09/23 1652   doxycycline  (VIBRAMYCIN ) 100 mg in sodium chloride  0.9 % 250 mL IVPB  100 mg Intravenous Q12H Jadine Toribio SQUIBB, MD 125 mL/hr at  12/12/23 0608 100 mg at 12/12/23 9391   famotidine  (PEPCID ) IVPB 20 mg premix  20 mg Intravenous BID Ozan, Jennifer, DO 100 mL/hr at 12/12/23 0958 20 mg at 12/12/23 0958   gabapentin (NEURONTIN) capsule 300 mg  300 mg Oral TID Ozan, Jennifer, DO   300 mg at 12/12/23 1510   hydrALAZINE (APRESOLINE) injection 5 mg  5 mg Intravenous Q6H PRN Jadine Toribio SQUIBB, MD   5 mg at 12/04/23 9090   insulin  aspart (novoLOG) injection  0-20 Units  0-20 Units Subcutaneous TID WC Arlon Carliss ORN, DO   4 Units at 12/12/23 1410   insulin  aspart (novoLOG) injection 0-5 Units  0-5 Units Subcutaneous QHS Arlon Carliss W, DO       insulin  glargine-yfgn [facility]) injection 6 Units  6 Units Subcutaneous Daily Arlon Carliss ORN, DO   6 Units at 12/12/23 1011   loperamide (IMODIUM) capsule 2 mg  2 mg Oral PRN Arlon Carliss ORN, DO   2 mg at 12/08/23 9048   losartan (COZAAR) tablet 25 mg  25 mg Oral Daily Al-Sultani, Anmar, MD   25 mg at 12/12/23 1002   menthol  (CEPACOL) lozenge 3 mg  1 lozenge Oral PRN Ozan, Jennifer, DO   3 mg at 12/10/23 2053   metoCLOPramide  (REGLAN ) tablet 5 mg  5 mg Oral Q8H Zina Jerilynn LABOR, MD   5 mg at 12/12/23 1510   metroNIDAZOLE  (FLAGYL ) IVPB 500 mg  500 mg Intravenous Q12H Jadine Toribio SQUIBB, MD 100 mL/hr at 12/12/23 0839 500 mg at 12/12/23 9160   ondansetron  (ZOFRAN ) tablet 4 mg  4 mg Oral Q6H PRN Al-Sultani, Anmar, MD   4 mg at 12/12/23 1313   Oral care mouth rinse  15 mL Mouth Rinse PRN Jadine Toribio SQUIBB, MD       oxyCODONE  (Oxy IR/ROXICODONE ) immediate release tablet 5-10 mg  5-10 mg Oral Q4H PRN Lue Elsie BROCKS, MD   10 mg at 12/11/23 1336   pantoprazole  (PROTONIX ) EC tablet 40 mg  40 mg Oral Daily Lue Elsie BROCKS, MD   40 mg at 12/12/23 1002   promethazine  (PHENERGAN ) 12.5 mg in sodium chloride  0.9 % 50 mL IVPB  12.5 mg Intravenous Q6H PRN Arlon Carliss ORN, DO 150 mL/hr at 12/10/23 1249 12.5 mg at 12/10/23 1249   scopolamine  (TRANSDERM-SCOP) 1 MG/3DAYS 1 mg  1 patch Transdermal  Q72H Zina Jerilynn LABOR, MD       zolpidem  (AMBIEN ) tablet 5 mg  5 mg Oral QHS PRN Cleatus Moccasin, MD   5 mg at 12/06/23 2123      Labs  Recent Labs  Lab 12/10/23 0430 12/11/23 0057 12/12/23 0437  WBC 22.1* 20.2* 15.4*  HGB 8.7* 8.7* 8.6*  HCT 27.2* 27.1* 26.5*  PLT 348 366 394    Recent Labs  Lab 12/10/23 0430 12/11/23 0057 12/12/23 0437  NA 133* 136 133*  K 3.4* 3.4* 3.0*  CL 99 96* 96*  CO2 24 26 25   BUN 16 12 9   CREATININE 1.20* 0.99 1.04*  CALCIUM  8.0* 8.4* 8.6*  PROT 6.3* 6.7 6.4*  BILITOT 0.6 0.5 0.5  ALKPHOS 55 49 46  ALT 23 25 23   AST 48* 47* 42*  GLUCOSE 156* 154* 165*    Radiology Non current  Assessment & Plan:  POD 4 s/p exploratory laparotomy and evacuation of abdominal fluid collections *GYN: continue doxycycline  and flagyl  * Social work consult ordered to aid in discharge planning and financial assistance for meds *Pain: well controlled  *FEN/GI: continue regular diet Will discuss potassium replacement with patient *PPx: continue eliquis *Dispo: start planning for discharge with assistance, WBC is improving Message sent to hematology to help set up outpatient care   Jerilynn Zina, MD Attending Center for [facility])

## 2023-12-13 ENCOUNTER — Other Ambulatory Visit (HOSPITAL_COMMUNITY): Payer: Self-pay

## 2023-12-13 LAB — BASIC METABOLIC PANEL WITH GFR
Anion gap: 11 (ref 5–15)
BUN: 8 mg/dL (ref 6–20)
CO2: 25 mmol/L (ref 22–32)
Calcium: 9 mg/dL (ref 8.9–10.3)
Chloride: 99 mmol/L (ref 98–111)
Creatinine, Ser: 1 mg/dL (ref 0.44–1.00)
GFR, Estimated: >60 mL/min (ref >60)
Glucose, Bld: 168 mg/dL — ABNORMAL HIGH (ref 70–99)
Potassium: 3.7 mmol/L (ref 3.5–5.1)
Sodium: 135 mmol/L (ref 135–145)

## 2023-12-13 LAB — COMPREHENSIVE METABOLIC PANEL WITH GFR
ALT: 24 U/L (ref 0–44)
AST: 41 U/L (ref 15–41)
Albumin: 2.3 g/dL — ABNORMAL LOW (ref 3.5–5.0)
Alkaline Phosphatase: 48 U/L (ref 38–126)
Anion gap: 13 (ref 5–15)
BUN: 7 mg/dL (ref 6–20)
CO2: 24 mmol/L (ref 22–32)
Calcium: 8.7 mg/dL — ABNORMAL LOW (ref 8.9–10.3)
Chloride: 97 mmol/L — ABNORMAL LOW (ref 98–111)
Creatinine, Ser: 1 mg/dL (ref 0.44–1.00)
GFR, Estimated: >60 mL/min (ref >60)
Glucose, Bld: 162 mg/dL — ABNORMAL HIGH (ref 70–99)
Potassium: 3.3 mmol/L — ABNORMAL LOW (ref 3.5–5.1)
Sodium: 134 mmol/L — ABNORMAL LOW (ref 135–145)
Total Bilirubin: 0.5 mg/dL (ref 0.0–1.2)
Total Protein: 6.3 g/dL — ABNORMAL LOW (ref 6.5–8.1)

## 2023-12-13 LAB — CBC
HCT: 27.7 % — ABNORMAL LOW (ref 36.0–46.0)
Hemoglobin: 8.9 g/dL — ABNORMAL LOW (ref 12.0–15.0)
MCH: 30.7 pg (ref 26.0–34.0)
MCHC: 32.1 g/dL (ref 30.0–36.0)
MCV: 95.5 fL (ref 80.0–100.0)
Platelets: 404 K/uL — ABNORMAL HIGH (ref 150–400)
RBC: 2.9 MIL/uL — ABNORMAL LOW (ref 3.87–5.11)
RDW: 13.1 % (ref 11.5–15.5)
WBC: 15.3 K/uL — ABNORMAL HIGH (ref 4.0–10.5)
nRBC: 0 % (ref 0.0–0.2)

## 2023-12-13 LAB — GLUCOSE, CAPILLARY
Glucose-Capillary: 151 mg/dL — ABNORMAL HIGH (ref 70–99)
Glucose-Capillary: 138 mg/dL — ABNORMAL HIGH (ref 70–99)
Glucose-Capillary: 206 mg/dL — ABNORMAL HIGH (ref 70–99)
Glucose-Capillary: 123 mg/dL — ABNORMAL HIGH (ref 70–99)
Glucose-Capillary: 176 mg/dL — ABNORMAL HIGH (ref 70–99)

## 2023-12-13 MED ORDER — OXYCODONE HCL 5 MG PO TABS
5.0000 mg | ORAL_TABLET | Freq: Four times a day (QID) | ORAL | 0 refills | Status: AC | PRN
  Filled 2023-12-13: qty 28, 4d supply, fill #0

## 2023-12-13 MED ORDER — GLYCOPYRROLATE 1 MG PO TABS
1.0000 mg | ORAL_TABLET | Freq: Three times a day (TID) | ORAL | Status: DC
  Administered 2023-12-13 – 2023-12-15 (×8): 1 mg via ORAL
  Filled 2023-12-13 (×8): qty 1

## 2023-12-13 MED ORDER — DOXYCYCLINE HYCLATE 100 MG PO TABS
100.0000 mg | ORAL_TABLET | Freq: Two times a day (BID) | ORAL | 0 refills | Status: DC
  Filled 2023-12-13: qty 28, 14d supply, fill #0

## 2023-12-13 MED ORDER — APIXABAN 5 MG PO TABS
ORAL_TABLET | ORAL | 0 refills | Status: DC
  Filled 2023-12-13: qty 74, 30d supply, fill #0

## 2023-12-13 MED ORDER — PROMETHAZINE HCL 25 MG PO TABS
25.0000 mg | ORAL_TABLET | Freq: Four times a day (QID) | ORAL | 0 refills | Status: DC | PRN
  Filled 2023-12-13: qty 30, 8d supply, fill #0

## 2023-12-13 MED ORDER — DOXYCYCLINE HYCLATE 100 MG PO TABS
100.0000 mg | ORAL_TABLET | Freq: Two times a day (BID) | ORAL | Status: DC
  Administered 2023-12-13 – 2023-12-16 (×5): 100 mg via ORAL
  Filled 2023-12-13 (×5): qty 1

## 2023-12-13 MED ORDER — LOSARTAN POTASSIUM 25 MG PO TABS
50.0000 mg | ORAL_TABLET | Freq: Every day | ORAL | Status: DC
  Administered 2023-12-13 – 2023-12-23 (×9): 50 mg via ORAL
  Filled 2023-12-13 (×9): qty 2

## 2023-12-13 MED ORDER — GLYCOPYRROLATE 1 MG PO TABS: 1.0000 mg | ORAL_TABLET | Freq: Three times a day (TID) | ORAL | Status: DC

## 2023-12-13 MED ORDER — AMLODIPINE BESYLATE 5 MG PO TABS
5.0000 mg | ORAL_TABLET | Freq: Every day | ORAL | Status: DC
  Administered 2023-12-13 – 2023-12-23 (×9): 5 mg via ORAL
  Filled 2023-12-13 (×9): qty 1

## 2023-12-13 MED ORDER — METRONIDAZOLE 500 MG PO TABS
500.0000 mg | ORAL_TABLET | Freq: Two times a day (BID) | ORAL | Status: DC
  Administered 2023-12-13 – 2023-12-16 (×6): 500 mg via ORAL
  Filled 2023-12-13 (×6): qty 1

## 2023-12-13 MED ORDER — METRONIDAZOLE 500 MG PO TABS
500.0000 mg | ORAL_TABLET | Freq: Two times a day (BID) | ORAL | 0 refills | Status: DC
  Filled 2023-12-13: qty 28, 14d supply, fill #0

## 2023-12-13 MED ORDER — SENNOSIDES-DOCUSATE SODIUM 8.6-50 MG PO TABS: 1.0000 | ORAL_TABLET | Freq: Every day | ORAL | Status: DC

## 2023-12-13 MED ORDER — ATORVASTATIN CALCIUM 80 MG PO TABS
80.0000 mg | ORAL_TABLET | Freq: Every day | ORAL | 0 refills | Status: AC
  Filled 2023-12-13: qty 30, 30d supply, fill #0

## 2023-12-13 MED ORDER — ACETAMINOPHEN 325 MG PO TABS
650.0000 mg | ORAL_TABLET | Freq: Four times a day (QID) | ORAL | 0 refills | Status: DC
  Filled 2023-12-13: qty 30, 4d supply, fill #0

## 2023-12-13 MED ORDER — POLYETHYLENE GLYCOL 3350 17 G PO PACK: 17.0000 g | PACK | Freq: Every day | ORAL | Status: DC

## 2023-12-13 MED ORDER — PROMETHAZINE HCL 25 MG PO TABS
25.0000 mg | ORAL_TABLET | Freq: Four times a day (QID) | ORAL | Status: DC | PRN
  Administered 2023-12-14 – 2023-12-23 (×16): 25 mg via ORAL
  Filled 2023-12-13 (×16): qty 1

## 2023-12-13 MED ORDER — POTASSIUM CHLORIDE 20 MEQ PO PACK
40.0000 meq | PACK | ORAL | Status: AC
  Administered 2023-12-13: 40 meq via ORAL
  Filled 2023-12-13 (×2): qty 2

## 2023-12-13 NOTE — Progress Notes (Signed)
 Progress Note   Patient: Misty Walsh FMW:[id number] DOB: 1990-11-08 DOA: 12/01/2023     12 DOS: the patient was seen and examined on 12/13/2023   Assessment and Plan:  #Acute PE #Acute bilateral DVTs - Noted to be tachypneic with worsening dyspnea.  Found to have acute PE. - CTA chest (12/10/2023) showed PE over the proximal right upper/middle lobe arteries and subsegmental left lower lobar artery with no evidence of right heart strain. - Etiology unclear, likely multifactorial given significant immobility, obesity, acute illness - Bilateral LE duplex showed acute bilateral DVTs with proximal involvement on the right - Vascular surgery consulted due to proximal right DVT - spoke to Dr. Magda and Dr. Pearline who indicated no surgical intervention at this time, only anticoagulation - Heparin drip discontinued on 10/23 - Transitioned on 10/23 to treatment dose Eliquis 10 mg BID for 7 days  then 5 mg BID - Recommend at least 3 months of anticoagulation for likely provoked DVT given significantly reduced mobility. May benefit from referral to outpatient hematology for further delineation of appropriate length of anticoagulation  #Acute hypoxic respiratory failure -resolved - Noted to be tachypneic with worsening dyspnea and was ultimately found to have acute PE, possibly complicated further by OHS - SpO2 94 -96 % on room air  #Sepsis secondary to large tubo-ovarian abscess - Presented with fever, leukocytosis, noted to have large tubo-ovarian abscess - Blood cultures (10/13) negative - Afebrile, leukocytosis improving - Completed 5 days of rocephin  - On doxycycline  and Flagyl  for TOA as below  # Large tubo-ovarian abscess status post ex lap w/ washout - POD 5 - OBGYN following - Analgesia - scheduled Tylenol  and gabapentin, PRN oxycodone  5-10 mg - As needed antiemetics - Surgical pathology showed benign cyst wall with cyst lining dilatation.  Negative for malignancy - Completed 5  days of Rocephin  - Continue doxycycline  and Flagyl  for total 14 days post-op - Encouraged OOB - Incentive spirometry  # Persistent nausea and vomiting # Excessive oral secretions - Reports persistent nausea and vomiting as well as excessive oral secretions, worsened overnight, unable to keep some meds down including her potassium.   - Already on scheduled Reglan  5 mg q8h, scopolamine  patch, Robinul  1 mg TID,  Protonix  40 mg daily, Pepcid  20 mg BID, PRN Zofran , PRN Phenergan  - Will obtain EKG to check QTc duration - If no QTc prolongation, would trial scheduled Zofran  or scheduled Phenergan  in favor of scheduled Reglan . Could also try SoluMedrol.   # Constipation - Reports no bowel movements for 2 days - Started Miralax  daily and Peri-colace nightly  # AKI - resolved - Etiology likely multifactorial - initially decreased PO intake and sepsis, now post IV contrast exposure and Lasix  - Baseline Cr ~ 0.9.  - Stable at 1.0 today  - Avoiding NSAIDs - Strict I/Os  # Hypokalemia - K down to 3.3 - Had been previously unable to tolerate PO potassium pills and had burning with IV KCl. - PO KCl 40 mEq packets x 2 ordered  #Hypertension - SBP in the 150s - Continue chlorthalidone  25 mg daily - Increased Losartan to 50 mg daily  # Bilateral lower extremity swelling - Patient reports her legs are more swollen than normal, initially thought due to fluid overload and was receiving Lasix, without much improvement - However, now found to have acute bilateral DVTs, likely the main driver of her BLE swelling - Will continue to monitor and hold off additional Lasix at this time  # Right  breast mass - Incidental finding of 2.5 cm oval mass with few coarse calcifications over the medial right breast was noted on CTA chest - Recommend follow-up with PCP or OB/GYN outpatient diagnostic mammography  #T2DM - Hgb A1c 6.3 (12/02/2023), well-controlled at home - Received dexamethasone  intraoperatively  and had postop hyperglycemia - Continue glargine 6 units daily (Started on 10/21) - SSI  # HLD - Continue Lipitor 80 mg daily  #Debility - PT/OT following  #Class III / Morbid Obesity (BMI Body mass index is 51.49 kg/m. kg/m) - Obesity is clinically significant and contributes to T2DM and HTN.  - Discussed lifestyle modification including dietary changes, regular physical activity, and weight reduction strategies.       Subjective: Patient seen at bedside this morning.  Reports persistent nausea, vomiting, and increased oral secretions overnight, unable to tolerate some of her meds including potassium supplementation.  Pain is reasonably controlled.  No bowel movements in 2 days.  Denies any fevers, chills, chest pain, shortness of breath.  Physical Exam: Vitals:   12/12/23 1945 12/12/23 1946 12/12/23 2326 12/13/23 0448  BP: (!) 147/93  (!) 144/71 (!) 150/85  Pulse: 97  (!) 106 93  Resp: 18  18 18   Temp: 97.8 F (36.6 C)  99.8 F (37.7 C) 98.8 F (37.1 C)  TempSrc: Oral  Oral Oral  SpO2: 96% 94% 95% 94%  Weight:      Height:       Physical Exam Constitutional:      General: She is not in acute distress.    Appearance: She is obese. She is not ill-appearing.     Interventions: Nasal cannula in place.  Cardiovascular:     Rate and Rhythm: Normal rate and regular rhythm.     Heart sounds: Normal heart sounds. No murmur heard. Pulmonary:     Effort: Pulmonary effort is normal. No respiratory distress.     Comments: Appears to be currently comfortable on 2L [place]. Unable to evaluate posterior lung fields Abdominal:     General: There is no distension.     Palpations: Abdomen is soft.     Tenderness: There is abdominal tenderness (TTP to mid and lower abdomen). There is no guarding.  Genitourinary:    Comments: JP drains x 2 in place, minimal serosanguineous fluid Musculoskeletal:     Right lower leg: No edema.     Left lower leg: No edema.  Skin:    General: Skin is  dry.  Neurological:     Mental Status: She is alert and oriented to person, place, and time. Mental status is at baseline.     Family Communication: None  Disposition: Status is: Inpatient Remains inpatient appropriate because: Requiring IV antibiotics for TOA, persistent nausea and vomiting, and acute PE/DVT  Planned Discharge Destination: Home    DVT Prophylaxis: apixaban (ELIQUIS) tablet 10 mg  apixaban (ELIQUIS) tablet 5 mg   Time spent: 40 minutes  Author: Renetta Suman Al-Sultani, MD 12/13/2023 7:14 AM  For on call review [email or web address].

## 2023-12-13 NOTE — Progress Notes (Signed)
 Gynecology Progress Note  Admission Date: 12/01/2023 Current Date: 12/13/2023 12:18 PM  Misty Walsh is a 33 y.o. G1P0101 POD 5 after exploratory laparotomy for pelvic abscess.  History complicated by: Patient Active Problem List   Diagnosis Date Noted   Sepsis (HCC) 12/09/2023   AKI (acute kidney injury) 12/09/2023   Acute hypoxic respiratory failure (HCC) 12/09/2023   S/P exploratory laparotomy 12/09/2023   Tubo-ovarian abscess 12/02/2023   Acute pyelonephritis 12/02/2023   BMI 50.0-59.9, adult (HCC) 12/02/2023   Obesity, class 3 (HCC) 12/02/2023   Sepsis due to gram-negative UTI (HCC) 12/01/2023   Metabolic dysfunction-associated steatotic liver disease (MASLD) 12/26/2022   Class 3 severe obesity with serious comorbidity and body mass index (BMI) of 50.0 to 59.9 in adult (HCC) 11/20/2022   Physically inactive 11/20/2022   Cyst of right ovary 06/05/2022   Episodic cluster headache, not intractable 04/09/2022   Hypoventilation associated with obesity syndrome (HCC) 04/09/2022   Sleep-related headache 04/09/2022   Loud snoring 04/09/2022   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/2022   Anxiety and depression 04/09/2022   Adnexal mass 03/20/2022   Type 2 diabetes mellitus without complication, without long-term current use of insulin  (HCC) 02/08/2021   Symptomatic mammary hypertrophy 12/07/2019   Back pain 12/07/2019   Neck pain 12/07/2019   Prediabetes 11/04/2019   Hyperlipemia 11/04/2019   Vitamin D  deficiency 11/04/2019   Primary hypertension 03/22/2019   Hemorrhagic ovarian cyst 06/21/2017   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  Still having some issues with nausea and spitting.  Tolerating some regular diet.  Pt is starting to ask when can she go home.  No fever or chills at this time.  Objective:   Vitals:   12/12/23 2326 12/13/23 0448 12/13/23 0916 12/13/23 1107  BP: (!) 144/71 (!) 150/85 (!) 152/88   Pulse: (!) 106 93 97   Resp: 18 18 20    Temp: 99.8 F (37.7 C) 98.8 F (37.1 C) 98.3 F (36.8 C) 98.2 F (36.8 C)  TempSrc: Oral Oral Oral Oral  SpO2: 95% 94% 97%   Weight:      Height:        Temp:  [97.8 F (36.6 C)-99.8 F (37.7 C)] 98.2 F (36.8 C) (10/25 1107) Pulse Rate:  [93-106] 97 (10/25 0916) Resp:  [17-20] 20 (10/25 0916) BP: (144-155)/(71-93) 152/88 (10/25 0916) SpO2:  [94 %-97 %] 97 % (10/25 0916) I/O last 3 completed shifts: In: 1330.4 [P.O.:480; I.V.:25.7; IV Piggyback:824.7] Out: 1940 [Urine:1900; Drains:40] Total I/O In: -  Out: 415 [Urine:400; Drains:15]  Intake/Output Summary (Last 24 hours) at 12/13/2023 1218 Last data filed at 12/13/2023 1120 Gross per 24 hour  Intake --  Output 1035 ml  Net -1035 ml     Current Vital Signs 24h Vital Sign Ranges  T 98.2 F (36.8 C) Temp  Avg: 98.5 F (36.9 C)  Min: 97.8 F (36.6 C)  Max: 99.8 F (37.7 C)  BP (!) 152/88 (Jillian, RN notified) BP  Min: 144/71  Max: 155/93  HR 97 Pulse  Avg: 99  Min: 93  Max: 106  RR 20 Resp  Avg: 18.2  Min: 17  Max:  20  SaO2 97 % Room Air SpO2  Avg: 95.3 %  Min: 94 %  Max: 97 %       24 Hour I/O Current Shift I/O  Time Ins Outs 10/24 0701 - 10/25 0700 In: 0  Out: 1320 [Urine:1300; Drains:20] 10/25 0701 - 10/25 1900 In: -  Out: 415 [Urine:400; Drains:15]   Patient Vitals for the past 12 hrs:  BP Temp Temp src Pulse Resp SpO2  12/13/23 1107 -- 98.2 F (36.8 C) Oral -- -- --  12/13/23 0916 (!) 152/88 98.3 F (36.8 C) Oral 97 20 97 %  12/13/23 0448 (!) 150/85 98.8 F (37.1 C) Oral 93 18 94 %     Patient Vitals for the past 24 hrs:  BP Temp Temp src Pulse  Resp SpO2  12/13/23 1107 -- 98.2 F (36.8 C) Oral -- -- --  12/13/23 0916 (!) 152/88 98.3 F (36.8 C) Oral 97 20 97 %  12/13/23 0448 (!) 150/85 98.8 F (37.1 C) Oral 93 18 94 %  12/12/23 2326 (!) 144/71 99.8 F (37.7 C) Oral (!) 106 18 95 %  12/12/23 1946 -- -- -- -- -- 94 %  12/12/23 1945 (!) 147/93 97.8 F (36.6 C) Oral 97 18 96 %  12/12/23 1635 (!) 155/93 98.3 F (36.8 C) Oral (!) 102 17 96 %    Physical exam: General appearance: alert, cooperative, appears stated age, no distress, and morbidly obese Abdomen: soft appropriately tender, nondistended, soft bowel sounds GU: No gross VB Lungs: clear to auscultation bilaterally Heart: regular rate and rhythm Extremities: diffuse moderate lower extremity edema, no new tenderness Skin: WNL Psych: appropriate Neurologic: Grossly normal  Medications Current Facility-Administered Medications  Medication Dose Route Frequency Provider Last Rate Last Admin   acetaminophen  (TYLENOL ) tablet 650 mg  650 mg Oral Q6H Ozan, Jennifer, DO   650 mg at 12/13/23 0817   alum & mag hydroxide-simeth (MAALOX/MYLANTA) 200-200-20 MG/5ML suspension 30 mL  30 mL Oral Q4H PRN Jadine Toribio SQUIBB, MD   30 mL at 12/03/23 0321   apixaban (ELIQUIS) tablet 10 mg  10 mg Oral BID Al-Sultani, Anmar, MD   10 mg at 12/13/23 1043   Followed by   NOREEN ON 12/18/2023] apixaban (ELIQUIS) tablet 5 mg  5 mg Oral BID Al-Sultani, Anmar, MD       atorvastatin (LIPITOR) tablet 80 mg  80 mg Oral Daily Lue Elsie BROCKS, MD   80 mg at 12/13/23 9065   chlorthalidone  (HYGROTON ) tablet 25 mg  25 mg Oral Daily Lue Elsie BROCKS, MD   25 mg at 12/13/23 9065   diphenhydrAMINE  (BENADRYL ) injection 50 mg  50 mg Intravenous Q8H PRN Ozan, Jennifer, DO   50 mg at 12/09/23 1652   doxycycline  (VIBRA -TABS) tablet 100 mg  100 mg Oral Q12H Zina Jerilynn LABOR, MD       gabapentin (NEURONTIN) capsule 300 mg  300 mg Oral TID Ozan, Jennifer, DO   300 mg at 12/13/23 0932   glycopyrrolate   (ROBINUL ) tablet 1 mg  1 mg Oral TID Zina Jerilynn LABOR, MD   1 mg at 12/13/23 0932   hydrALAZINE (APRESOLINE) injection 5 mg  5 mg Intravenous Q6H PRN Jadine Toribio SQUIBB, MD   5 mg at 12/04/23 9090   insulin  aspart (novoLOG) injection 0-20 Units  0-20 Units Subcutaneous TID WC Arlon Carliss ORN, DO   4 Units at 12/13/23 9068   insulin  aspart (novoLOG) injection 0-5 Units  0-5 Units Subcutaneous QHS Arlon Carliss ORN,  DO       insulin  glargine-yfgn (SEMGLEE) injection 6 Units  6 Units Subcutaneous Daily Arlon Carliss ORN, DO   6 Units at 12/13/23 1043   loperamide (IMODIUM) capsule 2 mg  2 mg Oral PRN Arlon Carliss ORN, DO   2 mg at 12/08/23 9048   losartan (COZAAR) tablet 50 mg  50 mg Oral Daily Al-Sultani, Anmar, MD   50 mg at 12/13/23 9068   menthol  (CEPACOL) lozenge 3 mg  1 lozenge Oral PRN Ozan, Jennifer, DO   3 mg at 12/10/23 2053   metoCLOPramide  (REGLAN ) tablet 5 mg  5 mg Oral Q8H Zina Jerilynn LABOR, MD   5 mg at 12/13/23 9353   metroNIDAZOLE  (FLAGYL ) tablet 500 mg  500 mg Oral Q12H Zina Jerilynn LABOR, MD   500 mg at 12/13/23 1211   ondansetron  (ZOFRAN ) tablet 4 mg  4 mg Oral Q6H PRN Al-Sultani, Anmar, MD   4 mg at 12/13/23 1125   Oral care mouth rinse  15 mL Mouth Rinse PRN Jadine Toribio SQUIBB, MD       oxyCODONE  (Oxy IR/ROXICODONE ) immediate release tablet 5-10 mg  5-10 mg Oral Q4H PRN Lue Elsie BROCKS, MD   10 mg at 12/13/23 1124   pantoprazole  (PROTONIX ) EC tablet 40 mg  40 mg Oral Daily Lue Elsie BROCKS, MD   40 mg at 12/13/23 0932   polyethylene glycol (MIRALAX  / GLYCOLAX ) packet 17 g  17 g Oral Daily Al-Sultani, Anmar, MD       potassium chloride  (KLOR-CON ) packet 40 mEq  40 mEq Oral Q4H Al-Sultani, Anmar, MD   40 mEq at 12/13/23 9182   promethazine  (PHENERGAN ) tablet 25 mg  25 mg Oral Q6H PRN Zina Jerilynn LABOR, MD       scopolamine  (TRANSDERM-SCOP) 1 MG/3DAYS 1 mg  1 patch Transdermal Q72H Zina Jerilynn LABOR, MD   1 mg at 12/12/23 1651   senna-docusate (Senokot-S) tablet 1 tablet  1  tablet Oral QHS Al-Sultani, Anmar, MD       zolpidem  (AMBIEN ) tablet 5 mg  5 mg Oral QHS PRN Cleatus Moccasin, MD   5 mg at 12/06/23 2123      Labs  Recent Labs  Lab 12/11/23 0057 12/12/23 0437 12/13/23 0415  WBC 20.2* 15.4* 15.3*  HGB 8.7* 8.6* 8.9*  HCT 27.1* 26.5* 27.7*  PLT 366 394 404*    Recent Labs  Lab 12/11/23 0057 12/12/23 0437 12/13/23 0415  NA 136 133* 134*  K 3.4* 3.0* 3.3*  CL 96* 96* 97*  CO2 26 25 24   BUN 12 9 7   CREATININE 0.99 1.04* 1.00  CALCIUM  8.4* 8.6* 8.7*  PROT 6.7 6.4* 6.3*  BILITOT 0.5 0.5 0.5  ALKPHOS 49 46 48  ALT 25 23 24   AST 47* 42* 41  GLUCOSE 154* 165* 162*    Radiology N/a  Assessment & Plan:  POD# 5 s/p exploratory laparotomy and evacuation of abdominal abscess *GYN: most meds converted to oral Awaiting social work and meds to beds regarding medication costs *Pain: well controlled *FEN/GI: continue regular diet Potassium replacement per medicine *PPx: continue eliquis *Dispo: continue discharge planning Anticipate d/c home on 10/26 or 10/27  Code Status: Prior  Total time taking care of the patient was 40 minutes, with greater than 50% of the time spent in face to face interaction with the patient.  Jerilynn Zina, MD Attending Center for [facility])

## 2023-12-13 NOTE — TOC Progression Note (Signed)
 Transition of Care [facility]) - Progression Note    Patient Details  Name: Misty Walsh MRN: [id number] Date of Birth: Jun 21, 1990  [facility] CM/SW Contact  Marval Gell, RN Phone Number: 12/13/2023, 2:12 PM  Clinical Narrative:     Requested [employer] to exchange out regular sized walker for a bariatric one. I have sent a MATCH letter to [facility] pharmacy and requested attending to send meds through [facility] pharmacy for DC     Barriers to Discharge: Continued Medical Work up, Inadequate or no insurance               Expected Discharge Plan and Services                                               Social Drivers of Health (SDOH) Interventions SDOH Screenings   Food Insecurity: No Food Insecurity (12/01/2023)  Housing: Low Risk  (12/01/2023)  Transportation Needs: No Transportation Needs (12/01/2023)  Utilities: Not At Risk (12/01/2023)  Depression (PHQ2-9): Low Risk  (08/15/2022)  Financial Resource Strain: Not on File (06/07/2021)   Received from [employer]  Physical Activity: Not on File (06/07/2021)   Received from [facility]  Social Connections: Not on File (11/04/2022)   Received from [facility]  Stress: Not on File (06/07/2021)   Received from [facility]  Tobacco Use: Medium Risk (12/08/2023)    Readmission Risk Interventions     No data to display

## 2023-12-14 ENCOUNTER — Inpatient Hospital Stay (HOSPITAL_COMMUNITY): Payer: MEDICAID

## 2023-12-14 ENCOUNTER — Other Ambulatory Visit (HOSPITAL_COMMUNITY): Payer: Self-pay

## 2023-12-14 DIAGNOSIS — Z9889 Other specified postprocedural states: Secondary | ICD-10-CM

## 2023-12-14 LAB — CBC
HCT: 27.1 % — ABNORMAL LOW (ref 36.0–46.0)
Hemoglobin: 8.7 g/dL — ABNORMAL LOW (ref 12.0–15.0)
MCH: 30.6 pg (ref 26.0–34.0)
MCHC: 32.1 g/dL (ref 30.0–36.0)
MCV: 95.4 fL (ref 80.0–100.0)
Platelets: 394 K/uL (ref 150–400)
RBC: 2.84 MIL/uL — ABNORMAL LOW (ref 3.87–5.11)
RDW: 13.8 % (ref 11.5–15.5)
WBC: 15.7 K/uL — ABNORMAL HIGH (ref 4.0–10.5)
nRBC: 0 % (ref 0.0–0.2)

## 2023-12-14 LAB — BASIC METABOLIC PANEL WITH GFR
Anion gap: 14 (ref 5–15)
BUN: 10 mg/dL (ref 6–20)
CO2: 24 mmol/L (ref 22–32)
Calcium: 8.9 mg/dL (ref 8.9–10.3)
Chloride: 96 mmol/L — ABNORMAL LOW (ref 98–111)
Creatinine, Ser: 1.05 mg/dL — ABNORMAL HIGH (ref 0.44–1.00)
GFR, Estimated: >60 mL/min (ref >60)
Glucose, Bld: 178 mg/dL — ABNORMAL HIGH (ref 70–99)
Potassium: 3.4 mmol/L — ABNORMAL LOW (ref 3.5–5.1)
Sodium: 134 mmol/L — ABNORMAL LOW (ref 135–145)

## 2023-12-14 LAB — GLUCOSE, CAPILLARY
Glucose-Capillary: 143 mg/dL — ABNORMAL HIGH (ref 70–99)
Glucose-Capillary: 155 mg/dL — ABNORMAL HIGH (ref 70–99)
Glucose-Capillary: 179 mg/dL — ABNORMAL HIGH (ref 70–99)
Glucose-Capillary: 152 mg/dL — ABNORMAL HIGH (ref 70–99)

## 2023-12-14 MED ORDER — NOVOLOG FLEXPEN 100 UNIT/ML ~~LOC~~ SOPN
PEN_INJECTOR | SUBCUTANEOUS | 11 refills | Status: DC
  Filled 2023-12-14: qty 15, 30d supply, fill #0

## 2023-12-14 MED ORDER — INSULIN PEN NEEDLE 32G X 4 MM MISC
0 refills | Status: DC
  Filled 2023-12-14: qty 100, 30d supply, fill #0

## 2023-12-14 MED ORDER — AMLODIPINE BESYLATE 5 MG PO TABS
5.0000 mg | ORAL_TABLET | Freq: Every day | ORAL | 0 refills | Status: AC
  Filled 2023-12-14: qty 30, 30d supply, fill #0

## 2023-12-14 MED ORDER — SCOPOLAMINE 1 MG/3DAYS TD PT72
1.0000 | MEDICATED_PATCH | TRANSDERMAL | 0 refills | Status: AC
  Filled 2023-12-14: qty 10, 30d supply, fill #0

## 2023-12-14 MED ORDER — IOHEXOL 350 MG/ML SOLN
75.0000 mL | Freq: Once | INTRAVENOUS | Status: AC | PRN
  Administered 2023-12-14: 75 mL via INTRAVENOUS

## 2023-12-14 MED ORDER — CHLORTHALIDONE 25 MG PO TABS
25.0000 mg | ORAL_TABLET | Freq: Every day | ORAL | 0 refills | Status: DC
  Filled 2023-12-14 – 2023-12-19 (×2): qty 30, 30d supply, fill #0

## 2023-12-14 MED ORDER — INSULIN GLARGINE 100 UNITS/ML SOLOSTAR PEN
6.0000 [IU] | PEN_INJECTOR | Freq: Every day | SUBCUTANEOUS | 0 refills | Status: DC
  Filled 2023-12-14: qty 3, 28d supply, fill #0

## 2023-12-14 MED ORDER — IOHEXOL 9 MG/ML PO SOLN
500.0000 mL | ORAL | Status: AC
  Administered 2023-12-14 (×2): 500 mL via ORAL

## 2023-12-14 MED ORDER — LOSARTAN POTASSIUM 50 MG PO TABS
50.0000 mg | ORAL_TABLET | Freq: Every day | ORAL | 0 refills | Status: AC
Start: 2023-12-14 — End: ?
  Filled 2023-12-14 – 2023-12-19 (×2): qty 30, 30d supply, fill #0

## 2023-12-14 MED ORDER — METOCLOPRAMIDE HCL 5 MG PO TABS
5.0000 mg | ORAL_TABLET | Freq: Three times a day (TID) | ORAL | 0 refills | Status: DC
  Filled 2023-12-14: qty 42, 14d supply, fill #0

## 2023-12-14 MED ORDER — GLYCOPYRROLATE 1 MG PO TABS
1.0000 mg | ORAL_TABLET | Freq: Three times a day (TID) | ORAL | 0 refills | Status: AC
  Filled 2023-12-14 – 2023-12-23 (×2): qty 90, 30d supply, fill #0

## 2023-12-14 MED ORDER — ONDANSETRON HCL 4 MG PO TABS
4.0000 mg | ORAL_TABLET | Freq: Four times a day (QID) | ORAL | 0 refills | Status: AC | PRN
  Filled 2023-12-14: qty 20, 5d supply, fill #0

## 2023-12-14 NOTE — Discharge Instructions (Addendum)
 0-20 Units, Subcutaneous, 3 times daily with meals, First dose on Tue 12/09/23 at 1200 Correction coverage: Resistant (obese, steroids)  CBG < 70: Implement Hypoglycemia Standing Orders and refer to Hypoglycemia Standing Orders sidebar report CBG 70 - 120: 0 units  CBG 121 - 150: 3 units  CBG 151 - 200: 4 units  CBG 201 - 250: 7 units  CBG 251 - 300: 11 units  CBG 301 - 350: 15 units  CBG 351 - 400: 20 units    0-5 Units, Subcutaneous, Daily at bedtime, First dose on Tue 12/09/23 at 2200 Correction coverage: HS scale CBG < 70: Implement Hypoglycemia Standing Orders and refer to Hypoglycemia Standing Orders sidebar report  CBG 70 - 120: 0 units  CBG 121 - 150: 0 units  CBG 151 - 200: 0 units  CBG 201 - 250: 2 units  CBG 251 - 300: 3 units  CBG 301 - 350: 4 units  CBG 351 - 400: 5 units    Interventional Radiology Percutaneous Abscess Drain Placement After Care   This sheet gives you information about how to care for yourself after your procedure. Your health care provider may also give you more specific instructions. Your drain was placed by an interventional radiologist with [facility]. If you have questions or concerns, contact [facility] at [phone].   What is a percutaneous drain?   A drain is a small plastic tube (catheter) that goes into the fluid collection in your body through your skin.   How long will I need the drain?   How long the drain needs to stay in is determined by where the drain is, how much comes out of the drain each day and if you are having any other surgical procedures.   Interventional radiology will determine when it is time to remove the drain. It is important to follow up as directed so that the drain can be removed as soon as it is safe to do so.   What can I expect after the procedure?   After the procedure, it is common to have:   A small amount of bruising and discomfort in the area where the drainage tube (catheter) was  placed.   Sleepiness and fatigue. This should go away after the medicines you were given have worn off.   Follow these instructions at home:   Insertion site care   Check your insertion site when you change the bandage. Check for:   More redness, swelling, or pain.   More fluid or blood.   Warmth.   Pus or a bad smell.   When caring for your insertion site:   Wash your hands with soap and water for at least 20 seconds before and after you change your bandage (dressing). If soap and water are not available, use hand sanitizer.   You do not need to change your dressing everyday if it is clean and dry. Change your dressing every 3 days or as needed when it is soiled, wet or becoming dislodged. You will need to change your dressing each time you shower.   Leave stitches (sutures), skin glue, or adhesive strips in place. These skin closures may need to stay in place for 2 weeks or longer. If adhesive strip edges start to loosen and curl up, you may trim the loose edges. Do not remove adhesive strips completely unless your health care provider tells you to do so.   Catheter care   Flush the catheter once per day  with 5 mL of 0.9% normal saline unless you are told otherwise by your healthcare provider. This helps to prevent clogs in the catheter.   To disconnect the drain, turn the clear plastic tube to the left. Attach the saline syringe by placing it on the white end of the drain and turning gently to the right. Once attached gently push the plunger to the 5 mL mark. After you are done flushing, disconnect the syringe by turning to the left and reattach your drainage container   If you have a bulb please be sure the bulb is charged after reconnecting it - to do this pinch the bulb between your thumb and first finger and close the stopper located on the top of the bulb.    Check for fluid leaking from around your catheter (instead of fluid draining through your catheter). This may be a  sign that the drain is no longer working correctly.   Write down the following information every time you empty your bag:   The date and time.   The amount of drainage.   Activity   Rest at home for 1-2 days after your procedure.   For the first 48 hours do not lift anything more than 10 lbs (about a gallon of milk). You may perform moderate activities/exercise. Please avoid strenuous activities during this time.   Avoid any activities which may pull on your drain as this can cause your drain to become dislodged.   If you were given a sedative during the procedure, it can affect you for several hours. Do not drive or operate machinery until your health care provider says that it is safe.   General instructions   For mild pain take over-the-counter medications as needed for pain such as Tylenol  or Advil . If you are experiencing severe pain please call our office as this may indicate an issue with your drain.    If you were prescribed an antibiotic medicine, take it as told by your health care provider. Do not stop using the antibiotic even if you start to feel better.   You may shower 24 hours after the drain is placed. To do this cover the insertion site with a water tight material such as saran wrap and seal the edges with tape, you may also purchase waterproof dressings at your local drug store. Shower as usual and then remove the water tight dressing and any gauze/tape underneath it once you have exited the shower and dried off. Allow the area to air dry or pat dry with a clean towel. Once the skin is completely dry place a new gauze dressing. It is important to keep the site dry at all times to prevent infection.   Do not submerge the drain - this means you cannot take baths, swim, use a hot tub, etc. until the drain is removed.    Do not use any products that contain nicotine or tobacco, such as cigarettes, e-cigarettes, and chewing tobacco. If you need help quitting, ask your health  care provider.   Keep all follow-up visits as told by your health care provider. This is important.   Contact a health care provider if:   You have less than 10 mL of drainage a day for 2-3 days in a row, or as directed by your health care provider.   You have any of these signs of infection:   More redness, swelling, or pain around your incision area.   More fluid or blood coming from your  incision area.   Warmth coming from your incision area.   Pus or a bad smell coming from your incision area.   You have fluid leaking from around your catheter (instead of through your catheter).   You are unable to flush the drain.   You have a fever or chills.   You have pain that does not get better with medicine.   You have not been contacted to schedule a drain follow up appointment within 10 days of discharge from the hospital.   Please call [facility] at [phone] with any questions or concerns.   Get help right away if:   Your catheter comes out.   You suddenly stop having drainage from your catheter.   You suddenly have blood in the fluid that is draining from your catheter.   You become dizzy or you faint.   You develop a rash.   You have nausea or vomiting.   You have difficulty breathing or you feel short of breath.   You develop chest pain.   You have problems with your speech or vision.   You have trouble balancing or moving your arms or legs.   Summary   It is common to have a small amount of bruising and discomfort in the area where the drainage tube (catheter) was placed. You may also have minor discomfort with movement while the drain is in place.   Flush the drain once per day with 5 mL of 0.9% normal saline (unless you were told otherwise by your healthcare provider).    Record the amount of drainage from the bag every time you empty it.   Change the dressing every 3 days or earlier if soiled/wet. Keep the skin dry under the dressing.    You may shower with the drain in place. Do not submerge the drain (no baths, swimming, hot tubs, etc.).   Contact [facility] at [phone] if you have more redness, swelling, or pain around your incision area or if you have pain that does not get better with medicine.   This information is not intended to replace advice given to you by your health care provider. Make sure you discuss any questions you have with your health care provider.   Document Revised: 05/10/2021 Document Reviewed: 01/30/2019   Elsevier Patient Education  2023 [employer]         Interventional Radiology Drain Record   Empty your drain at least once per day. You may empty it as often as needed. Use this form to write down the amount of fluid that has collected in the drainage container. Bring this form with you to your follow-up visits. Please call [facility] at [phone] with any questions or concerns prior to your appointment.   Drain #1 location: ___________________   Date __________ Time __________ Amount __________   Date __________ Time __________ Amount __________   Date __________ Time __________ Amount __________   Date __________ Time __________ Amount __________   Date __________ Time __________ Amount __________   Date __________ Time __________ Amount __________   Date __________ Time __________ Amount __________   Date __________ Time __________ Amount __________   Date __________ Time __________ Amount __________   Date __________ Time __________ Amount __________   Date __________ Time __________ Amount __________   Date __________ Time __________ Amount __________   Date __________ Time __________ Amount __________   Date __________ Time __________ Amount __________

## 2023-12-14 NOTE — Progress Notes (Signed)
 Progress Note   Patient: Misty Walsh FMW:[id number] DOB: Apr 12, 1990 DOA: 12/01/2023     13 DOS: the patient was seen and examined on 12/14/2023   Assessment and Plan:  #Acute PE #Acute bilateral DVTs - Noted to be tachypneic with worsening dyspnea.  Found to have acute PE. - CTA chest (12/10/2023) showed PE over the proximal right upper/middle lobe arteries and subsegmental left lower lobar artery with no evidence of right heart strain. - Etiology unclear, likely multifactorial given significant immobility, obesity, acute illness - Bilateral LE duplex showed acute bilateral DVTs with proximal involvement on the right - Vascular surgery consulted due to proximal right DVT - spoke to Dr. Magda and Dr. Pearline who indicated no surgical intervention at this time, only anticoagulation - Heparin drip discontinued on 10/23 - Transitioned on 10/23 to treatment dose Eliquis 10 mg BID for 7 days  then 5 mg BID - Recommend at least 3 months of anticoagulation for likely provoked DVT given significantly reduced mobility. May benefit from referral to outpatient hematology for further delineation of appropriate length of anticoagulation  #Acute hypoxic respiratory failure - resolved - Noted to be tachypneic with worsening dyspnea and was ultimately found to have acute PE, possibly complicated further by OHS - On room air  #Sepsis secondary to large tubo-ovarian abscess - Presented with fever, leukocytosis, noted to have large tubo-ovarian abscess - Blood cultures (10/13) negative - Afebrile, leukocytosis improving - Completed 5 days of rocephin  - On doxycycline  and Flagyl  for TOA as below  # Large tubo-ovarian abscess status post ex lap w/ washout - POD 6 - Analgesia - scheduled Tylenol  and gabapentin, PRN oxycodone  5-10 mg - Surgical pathology showed benign cyst wall with cyst lining dilatation.  Negative for malignancy - Completed 5 days of Rocephin  - Continue doxycycline  and Flagyl  for  total 14 days post-op - Encouraged OOB - Incentive spirometry - Further management per OBGYN - ordered CT abd/pelvis  # Persistent nausea and vomiting # Excessive oral secretions - Reports persistent nausea and vomiting as well as excessive oral secretions, worsened overnight, unable to keep some meds down including her potassium.   - Already on scheduled Reglan  5 mg q8h, scopolamine  patch, Robinul  1 mg TID,  Protonix  40 mg daily, Pepcid  20 mg BID, PRN Zofran , PRN Phenergan  - Could trial scheduled Zofran  or scheduled Phenergan  in favor of scheduled Reglan . Could also try SoluMedrol.   # Constipation - Miralax  daily and Peri-colace nightly  # AKI - resolved - Etiology likely multifactorial - initially decreased PO intake and sepsis, now post IV contrast exposure and Lasix  - Baseline Cr ~ 0.9.  - Stable at 1.0 today  - Avoiding NSAIDs - Strict I/Os  # Hypokalemia - K down to 3.3 - Had been previously unable to tolerate PO potassium pills and had burning with IV KCl. - PO KCl 40 mEq packets x 2 ordered  #Hypertension - SBP in the 130s - Continue chlorthalidone  25 mg daily, Losartan to 50 mg daily, and amlodipine 5 mg daily  # Bilateral lower extremity swelling - Patient reports her legs are more swollen than normal, initially thought due to fluid overload and was receiving Lasix, without much improvement - However, now found to have acute bilateral DVTs, likely the main driver of her BLE swelling - Will continue to monitor and hold off additional Lasix at this time  # Right breast mass - Incidental finding of 2.5 cm oval mass with few coarse calcifications over the medial right breast  was noted on CTA chest - Recommend follow-up with PCP or OB/GYN outpatient diagnostic mammography  #T2DM - Hgb A1c 6.3 (12/02/2023), well-controlled at home - Received dexamethasone  intraoperatively and had postop hyperglycemia - Continue glargine 6 units daily (Started on 10/21) - SSI  #  HLD - Continue Lipitor 80 mg daily  #Debility - PT/OT following  #Class III / Morbid Obesity (BMI Body mass index is 51.49 kg/m. kg/m) - Obesity is clinically significant and contributes to T2DM and HTN.  - Discussed lifestyle modification including dietary changes, regular physical activity, and weight reduction strategies.       Subjective: Patient seen at bedside this morning.  Reports persistent nausea. Abdominal pain still present. Stating she's undergoing a CT abdomen pelvis that was ordered. Her son and son's father were at bedside.    Physical Exam: Vitals:   12/14/23 0108 12/14/23 0529 12/14/23 0808 12/14/23 1655  BP: 133/74 133/62 (!) 140/78 (!) 137/98  Pulse: (!) 170 (!) 104 93 98  Resp: (!) 22 (!) 24 20 19   Temp: 98.5 F (36.9 C)  98.4 F (36.9 C) 98.4 F (36.9 C)  TempSrc: Oral  Oral Oral  SpO2:  95% 98% 96%  Weight:      Height:       Physical Exam Constitutional:      General: She is not in acute distress.    Appearance: She is obese. She is not ill-appearing.     Interventions: Nasal cannula in place.  Cardiovascular:     Rate and Rhythm: Normal rate and regular rhythm.     Heart sounds: Normal heart sounds. No murmur heard. Pulmonary:     Effort: Pulmonary effort is normal. No respiratory distress.     Comments: Unable to evaluate posterior lung fields Abdominal:     General: There is no distension.     Palpations: Abdomen is soft.     Tenderness: There is abdominal tenderness (TTP to mid and lower abdomen). There is no guarding.  Genitourinary:    Comments: JP drains x 2 in place, minimal serosanguineous fluid Musculoskeletal:     Right lower leg: No edema.     Left lower leg: No edema.  Skin:    General: Skin is dry.  Neurological:     Mental Status: She is alert and oriented to person, place, and time. Mental status is at baseline.     Family Communication: None  Disposition: Status is: Inpatient Remains inpatient appropriate  because: Requiring IV antibiotics for TOA, persistent nausea and vomiting, and acute PE/DVT  Planned Discharge Destination: Home    DVT Prophylaxis: apixaban (ELIQUIS) tablet 10 mg  apixaban (ELIQUIS) tablet 5 mg   Time spent: 40 minutes  Author: Manley Fason Al-Sultani, MD 12/14/2023 11:07 PM  For on call review [email or web address].

## 2023-12-14 NOTE — Progress Notes (Signed)
 Gynecology Progress Note  Admission Date: 12/01/2023 Current Date: 12/14/2023 12:40 PM  Misty Walsh is a 33 y.o. G1P0101 POD 6 admitted for exploratory laparotomy for pelvic abscess.   History complicated by: Patient Active Problem List   Diagnosis Date Noted   Sepsis (HCC) 12/09/2023   AKI (acute kidney injury) 12/09/2023   Acute hypoxic respiratory failure (HCC) 12/09/2023   S/P exploratory laparotomy 12/09/2023   Tubo-ovarian abscess 12/02/2023   Acute pyelonephritis 12/02/2023   BMI 50.0-59.9, adult (HCC) 12/02/2023   Obesity, class 3 (HCC) 12/02/2023   Sepsis due to gram-negative UTI (HCC) 12/01/2023   Metabolic dysfunction-associated steatotic liver disease (MASLD) 12/26/2022   Class 3 severe obesity with serious comorbidity and body mass index (BMI) of 50.0 to 59.9 in adult (HCC) 11/20/2022   Physically inactive 11/20/2022   Cyst of right ovary 06/05/2022   Episodic cluster headache, not intractable 04/09/2022   Hypoventilation associated with obesity syndrome (HCC) 04/09/2022   Sleep-related headache 04/09/2022   Loud snoring 04/09/2022   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/2022   Anxiety and depression 04/09/2022   Adnexal mass 03/20/2022   Type 2 diabetes mellitus without complication, without long-term current use of insulin  (HCC) 02/08/2021   Symptomatic mammary hypertrophy 12/07/2019   Back pain 12/07/2019   Neck pain 12/07/2019   Prediabetes 11/04/2019   Hyperlipemia 11/04/2019   Vitamin D  deficiency 11/04/2019   Primary hypertension 03/22/2019   Hemorrhagic ovarian cyst 06/21/2017   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   Cellulitis 01/06/2013   History of cesarean section, classical 12/18/2012   Allergy 10/29/2012    ROS and patient/family/surgical history, located on admission H&P note dated 12/01/2023, have been reviewed, and there are no changes except as noted below Yesterday/Overnight Events:  None significant  Subjective:   Pt seen today.  She states she would like to go home, but also that her nausea has increased.  She denies fever and chills.  There was a bout of vomiting which she attributes to constant spitting.  Objective:   Vitals:   12/13/23 2002 12/14/23 0108 12/14/23 0529 12/14/23 0808  BP: (!) 152/79 133/74 133/62 (!) 140/78  Pulse: (!) 101 (!) 170 (!) 104 93  Resp: (!) 22 (!) 22 (!) 24 20  Temp: 98 F (36.7 C) 98.5 F (36.9 C)  98.4 F (36.9 C)  TempSrc: Oral Oral  Oral  SpO2: 98%  95% 98%  Weight:      Height:        Temp:  [98 F (36.7 C)-98.5 F (36.9 C)] 98.4 F (36.9 C) (10/26 0808) Pulse Rate:  [90-170] 93 (10/26 0808) Resp:  [20-24] 20 (10/26 0808) BP: (133-158)/(62-116) 140/78 (10/26 0808) SpO2:  [95 %-98 %] 98 % (10/26 0808) I/O last 3 completed shifts: In: 360 [P.O.:360] Out: 2637 [Urine:2600; Drains:37] Total I/O In: -  Out: 250 [Urine:250]  Intake/Output Summary (Last 24 hours) at 12/14/2023 1240 Last data filed at 12/14/2023 1124 Gross per 24 hour  Intake 360 ml  Output 1872 ml  Net -1512 ml     Current Vital Signs 24h Vital Sign Ranges  T 98.4 F (36.9 C) Temp  Avg: 98.2 F (36.8 C)  Min: 98 F (36.7 C)  Max: 98.5 F (36.9 C)  BP (!) 140/78 BP  Min: 133/62  Max: 158/116  HR 93 Pulse  Avg: 110.8  Min: 90  Max: 170  RR 20 Resp  Avg: 21.3  Min: 20  Max: 24  SaO2 98 %  Room Air SpO2  Avg: 96.8 %  Min: 95 %  Max: 98 %       24 Hour I/O Current Shift I/O  Time Ins Outs 10/25 0701 - 10/26 0700 In: 360 [P.O.:360] Out: 2037 [Urine:2000; Drains:37] 10/26 0701 - 10/26 1900 In: -  Out: 250 [Urine:250]   Patient Vitals for the past 12 hrs:  BP Temp Temp src Pulse Resp SpO2  12/14/23 0808 (!) 140/78 98.4 F (36.9 C) Oral 93 20 98 %  12/14/23 0529 133/62 -- -- (!) 104 (!) 24 95 %  12/14/23 0108 133/74 98.5 F (36.9 C) Oral (!) 170 (!) 22 --     Patient Vitals for the past 24 hrs:  BP Temp Temp src Pulse Resp SpO2  12/14/23 0808 (!) 140/78 98.4 F  (36.9 C) Oral 93 20 98 %  12/14/23 0529 133/62 -- -- (!) 104 (!) 24 95 %  12/14/23 0108 133/74 98.5 F (36.9 C) Oral (!) 170 (!) 22 --  12/13/23 2002 (!) 152/79 98 F (36.7 C) Oral (!) 101 (!) 22 98 %  12/13/23 1523 (!) 150/81 -- -- (!) 107 -- --  12/13/23 1517 -- 98 F (36.7 C) Oral -- 20 97 %  12/13/23 1341 (!) 158/116 98.1 F (36.7 C) Oral 90 20 96 %    Physical exam: General appearance: alert, cooperative, appears stated age, no distress, and morbidly obese Abdomen: soft, non-tender; bowel sounds normal; no masses,  no organomegaly and positive bowel sounds GU: No gross VB Lungs: clear to auscultation bilaterally Heart: regular rate and rhythm Extremities:bilateral mild lower extremity edema Skin: WNL Psych: appropriate Neurologic: Grossly normal  Medications Current Facility-Administered Medications  Medication Dose Route Frequency Provider Last Rate Last Admin   acetaminophen  (TYLENOL ) tablet 650 mg  650 mg Oral Q6H Ozan, Jennifer, DO   650 mg at 12/14/23 1049   alum & mag hydroxide-simeth (MAALOX/MYLANTA) 200-200-20 MG/5ML suspension 30 mL  30 mL Oral Q4H PRN Jadine Toribio SQUIBB, MD   30 mL at 12/03/23 0321   amLODipine (NORVASC) tablet 5 mg  5 mg Oral Daily Al-Sultani, Anmar, MD   5 mg at 12/14/23 1050   apixaban (ELIQUIS) tablet 10 mg  10 mg Oral BID Al-Sultani, Anmar, MD   10 mg at 12/14/23 1050   Followed by   NOREEN ON 12/18/2023] apixaban (ELIQUIS) tablet 5 mg  5 mg Oral BID Al-Sultani, Anmar, MD       atorvastatin (LIPITOR) tablet 80 mg  80 mg Oral Daily Lue Elsie BROCKS, MD   80 mg at 12/14/23 1050   chlorthalidone  (HYGROTON ) tablet 25 mg  25 mg Oral Daily Lue Elsie BROCKS, MD   25 mg at 12/14/23 1050   diphenhydrAMINE  (BENADRYL ) injection 50 mg  50 mg Intravenous Q8H PRN Ozan, Jennifer, DO   50 mg at 12/09/23 1652   doxycycline  (VIBRA -TABS) tablet 100 mg  100 mg Oral Q12H Zina Jerilynn LABOR, MD   100 mg at 12/14/23 1049   gabapentin (NEURONTIN) capsule 300  mg  300 mg Oral TID Ozan, Jennifer, DO   300 mg at 12/14/23 1049   glycopyrrolate  (ROBINUL ) tablet 1 mg  1 mg Oral TID Zina Jerilynn LABOR, MD   1 mg at 12/14/23 1049   hydrALAZINE (APRESOLINE) injection 5 mg  5 mg Intravenous Q6H PRN Jadine Toribio SQUIBB, MD   5 mg at 12/04/23 9090   insulin  aspart (novoLOG) injection 0-20 Units  0-20 Units Subcutaneous TID WC Arlon Carliss ORN, DO  3 Units at 12/14/23 9060   insulin  aspart (novoLOG) injection 0-5 Units  0-5 Units Subcutaneous QHS Arlon Carliss ORN, DO       insulin  glargine-yfgn [facility]) injection 6 Units  6 Units Subcutaneous Daily Arlon Carliss ORN, DO   6 Units at 12/14/23 1052   iohexol  (OMNIPAQUE ) 9 MG/ML oral solution 500 mL  500 mL Oral Q1H Zina Jerilynn LABOR, MD       loperamide (IMODIUM) capsule 2 mg  2 mg Oral PRN Arlon Carliss ORN, DO   2 mg at 12/08/23 9048   losartan (COZAAR) tablet 50 mg  50 mg Oral Daily Al-Sultani, Anmar, MD   50 mg at 12/14/23 1049   menthol  (CEPACOL) lozenge 3 mg  1 lozenge Oral PRN Ozan, Jennifer, DO   3 mg at 12/10/23 2053   metoCLOPramide  (REGLAN ) tablet 5 mg  5 mg Oral Q8H Zina Jerilynn LABOR, MD   5 mg at 12/14/23 0531   metroNIDAZOLE  (FLAGYL ) tablet 500 mg  500 mg Oral Q12H Zina Jerilynn LABOR, MD   500 mg at 12/14/23 1049   ondansetron  (ZOFRAN ) tablet 4 mg  4 mg Oral Q6H PRN Al-Sultani, Anmar, MD   4 mg at 12/14/23 0827   Oral care mouth rinse  15 mL Mouth Rinse PRN Jadine Toribio SQUIBB, MD       oxyCODONE  (Oxy IR/ROXICODONE ) immediate release tablet 5-10 mg  5-10 mg Oral Q4H PRN Lue Elsie BROCKS, MD   10 mg at 12/14/23 0531   pantoprazole  (PROTONIX ) EC tablet 40 mg  40 mg Oral Daily Lue Elsie BROCKS, MD   40 mg at 12/14/23 1049   polyethylene glycol (MIRALAX  / GLYCOLAX ) packet 17 g  17 g Oral Daily Al-Sultani, Anmar, MD       promethazine  (PHENERGAN ) tablet 25 mg  25 mg Oral Q6H PRN Zina Jerilynn LABOR, MD       scopolamine  (TRANSDERM-SCOP) 1 MG/3DAYS 1 mg  1 patch Transdermal Q72H Zina Jerilynn LABOR, MD   1 mg at  12/12/23 1651   senna-docusate (Senokot-S) tablet 1 tablet  1 tablet Oral QHS Al-Sultani, Anmar, MD       zolpidem  (AMBIEN ) tablet 5 mg  5 mg Oral QHS PRN Cleatus Moccasin, MD   5 mg at 12/06/23 2123      Labs  Recent Labs  Lab 12/12/23 0437 12/13/23 0415 12/14/23 0723  WBC 15.4* 15.3* 15.7*  HGB 8.6* 8.9* 8.7*  HCT 26.5* 27.7* 27.1*  PLT 394 404* 394    Recent Labs  Lab 12/11/23 0057 12/12/23 0437 12/13/23 0415 12/13/23 1740 12/14/23 0723  NA 136 133* 134* 135 134*  K 3.4* 3.0* 3.3* 3.7 3.4*  CL 96* 96* 97* 99 96*  CO2 26 25 24 25 24   BUN 12 9 7 8 10   CREATININE 0.99 1.04* 1.00 1.00 1.05*  CALCIUM  8.4* 8.6* 8.7* 9.0 8.9  PROT 6.7 6.4* 6.3*  --   --   BILITOT 0.5 0.5 0.5  --   --   ALKPHOS 49 46 48  --   --   ALT 25 23 24   --   --   AST 47* 42* 41  --   --   GLUCOSE 154* 165* 162* 168* 178*    Radiology CT pending  Assessment & Plan:  POD#6 s/p exploratory laparotomy and evacuation of abdominal abscess *GYN: stable *Pain: well controlled *FEN/GI: continue regular diet *PPx: continue eliquis *Dispo: If CT scan negative and nausea improves, d/c home today or tomorrow,  would recommend wound check and staple removal this week, potentially remove drains as well. Will still work on getting patient in with hematology even though outpatient referral already sent.  Code Status: Prior  Jerilynn Buddle, MD  [facility]ending [facility])

## 2023-12-15 ENCOUNTER — Other Ambulatory Visit (HOSPITAL_COMMUNITY): Payer: Self-pay

## 2023-12-15 LAB — GLUCOSE, CAPILLARY
Glucose-Capillary: 166 mg/dL — ABNORMAL HIGH (ref 70–99)
Glucose-Capillary: 259 mg/dL — ABNORMAL HIGH (ref 70–99)
Glucose-Capillary: 228 mg/dL — ABNORMAL HIGH (ref 70–99)

## 2023-12-15 LAB — HEPARIN LEVEL (UNFRACTIONATED)
Heparin Unfractionated: >1.1 [IU]/mL — ABNORMAL HIGH (ref 0.30–0.70)
Heparin Unfractionated: >1.1 [IU]/mL — ABNORMAL HIGH (ref 0.30–0.70)

## 2023-12-15 LAB — BASIC METABOLIC PANEL WITH GFR
Anion gap: 13 (ref 5–15)
BUN: 9 mg/dL (ref 6–20)
CO2: 26 mmol/L (ref 22–32)
Calcium: 8.9 mg/dL (ref 8.9–10.3)
Chloride: 94 mmol/L — ABNORMAL LOW (ref 98–111)
Creatinine, Ser: 1.04 mg/dL — ABNORMAL HIGH (ref 0.44–1.00)
GFR, Estimated: >60 mL/min (ref >60)
Glucose, Bld: 142 mg/dL — ABNORMAL HIGH (ref 70–99)
Potassium: 3.5 mmol/L (ref 3.5–5.1)
Sodium: 133 mmol/L — ABNORMAL LOW (ref 135–145)

## 2023-12-15 LAB — CBC
HCT: 28.3 % — ABNORMAL LOW (ref 36.0–46.0)
Hemoglobin: 9.2 g/dL — ABNORMAL LOW (ref 12.0–15.0)
MCH: 30.9 pg (ref 26.0–34.0)
MCHC: 32.5 g/dL (ref 30.0–36.0)
MCV: 95 fL (ref 80.0–100.0)
Platelets: 387 K/uL (ref 150–400)
RBC: 2.98 MIL/uL — ABNORMAL LOW (ref 3.87–5.11)
RDW: 13.7 % (ref 11.5–15.5)
WBC: 13.9 K/uL — ABNORMAL HIGH (ref 4.0–10.5)
nRBC: 0 % (ref 0.0–0.2)

## 2023-12-15 LAB — APTT
aPTT: 48 s — ABNORMAL HIGH (ref 24–36)
aPTT: 75 s — ABNORMAL HIGH (ref 24–36)

## 2023-12-15 MED ORDER — GLYCOPYRROLATE 0.2 MG/ML IJ SOLN
0.1000 mg | Freq: Three times a day (TID) | INTRAMUSCULAR | Status: DC
  Administered 2023-12-15 – 2023-12-19 (×14): 0.1 mg via INTRAVENOUS
  Filled 2023-12-15 (×13): qty 1

## 2023-12-15 MED ORDER — INSULIN ASPART 100 UNIT/ML IJ SOLN
0.0000 [IU] | Freq: Every day | INTRAMUSCULAR | Status: DC
  Administered 2023-12-18: 2 [IU] via SUBCUTANEOUS

## 2023-12-15 MED ORDER — HEPARIN (PORCINE) 25000 UT/250ML-% IV SOLN
2000.0000 [IU]/h | INTRAVENOUS | Status: DC
  Administered 2023-12-15 – 2023-12-17 (×4): 2000 [IU]/h via INTRAVENOUS
  Filled 2023-12-15 (×4): qty 250

## 2023-12-15 MED ORDER — POLYETHYLENE GLYCOL 3350 17 G PO PACK
17.0000 g | PACK | ORAL | Status: DC
  Administered 2023-12-18 – 2023-12-20 (×2): 17 g via ORAL
  Filled 2023-12-15 (×4): qty 1

## 2023-12-15 MED ORDER — INSULIN ASPART 100 UNIT/ML IJ SOLN
0.0000 [IU] | Freq: Three times a day (TID) | INTRAMUSCULAR | Status: DC
  Administered 2023-12-17: 8 [IU] via SUBCUTANEOUS
  Administered 2023-12-17: 2 [IU] via SUBCUTANEOUS
  Administered 2023-12-17 – 2023-12-19 (×6): 3 [IU] via SUBCUTANEOUS
  Administered 2023-12-19: 2 [IU] via SUBCUTANEOUS
  Administered 2023-12-20: 3 [IU] via SUBCUTANEOUS
  Administered 2023-12-20 – 2023-12-21 (×3): 2 [IU] via SUBCUTANEOUS
  Administered 2023-12-22: 3 [IU] via SUBCUTANEOUS

## 2023-12-15 MED ORDER — SENNOSIDES-DOCUSATE SODIUM 8.6-50 MG PO TABS: 1.0000 | ORAL_TABLET | Freq: Every evening | ORAL | Status: DC | PRN

## 2023-12-15 NOTE — Progress Notes (Addendum)
 Inpatient Diabetes Program Recommendations  AACE/ADA: New Consensus Statement on Inpatient Glycemic Control (2015)  Target Ranges:  Prepandial:   less than 140 mg/dL      Peak postprandial:   less than 180 mg/dL (1-2 hours)      Critically ill patients:  140 - 180 mg/dL   Lab Results  Component Value Date   GLUCAP 166 (H) 12/15/2023   HGBA1C 6.3 (H) 12/02/2023    Review of Glycemic Control  Latest Reference Range & Units 12/14/23 09:13 12/14/23 12:57 12/14/23 17:40 12/14/23 21:43 12/15/23 08:20  Glucose-Capillary 70 - 99 mg/dL 856 (H) 844 (H) 820 (H) 152 (H) 166 (H)   Diabetes history: Pre- Diabetes- (A1C=6.3%) Outpatient Diabetes medications: None Current orders for Inpatient glycemic control:  Novolog 0-20 units tid with meals and HS Semglee 6 units daily  Inpatient Diabetes Program Recommendations:    Note patient on insulin  in the hospital. Should not need insulin  at home.  May benefit from metformin or possibly a GLP-1.  Will need to f/u with PCP.   Addendum 77- Spoke with RN by phone.  She states that patient is eating very little and was concerned regarding Novolog correction 0-20 units tid.  Patient did not receive 8am on 12 noon dose.  May want to reduce Novolog correction to moderate (0-15 units) tid with meals, however patient should still receive coverage even if not eating due to it being correction.  Explained to RN.  Will continue to follow.   Thanks  Randall Bullocks, RN, BC-ADM Inpatient Diabetes Coordinator Pager [phone]  (8a-5p)

## 2023-12-15 NOTE — Consult Note (Signed)
 Chief Complaint: Pelvic abscess - IR consulted for pelvic drain placement  Referring Provider(s): Izell Harari, MD   Supervising Physician: Philip Cornet  Patient Status: [facility] - Out-pt  History of Present Illness: Misty Walsh is a 33 y.o. female with hx of DM, cesarean section (2014), HTN, HLD, STI, tubo-ovarian abscess. Patient initially presented to hospital on 12/01/23 for lower abdominal pain, dysuria, vaginal discharge. CT abd/pelvis 10/13 with cystic pelvic lesion. MRI pelvis 10/14 with 22.4 cm complex cystic mass in the right adnexa concerning for tubo-ovarian abscess. IR was initially consulted 10/17, but percutaneous drain was not deemed feasible at that time due to collection being too deep and complex in the pelvis. On 10/20, pt then underwent ex-lap via vertical midline/LOA/removal of left adnexal cyst wall/washout/JP drain placement x 2 (left and right posterior gutters). Surgical JP drains with persistent serosanguinous output since placement. Follow up CT abd/pelvis from yesterday with new pelvic cul-de-sac fluid collection measuring up to 11 x 5 x 4.8 cm containing multiple gas bubbles, worrisome for abscess.   Case and new imaging reviewed with IR attending Dr. Philip with approval and plan to proceed with pelvic drain placement for new collection. Today patient with diffuse lower abd/pelvic discomfort and some RUQ discomfort. Has been tolerating PO. WBC 13.9, vitals otherwise stable.  Pt currently on eliquis, will be bridged to heparin for procedure. Due to need for 48h eliquis hold, will look to tentatively proceed 12/17/23.   Patient is Full Code  Past Medical History:  Diagnosis Date   Anxiety    Back pain    Chlamydia    Depression med made her navel itching and  made her sleepy so she quit taking them   Diabetes mellitus without complication (HCC)    Edema, lower extremity    Elevated cholesterol    Fibrocystic breast changes of both breasts    GERD  (gastroesophageal reflux disease)    History of cesarean section, classical 12/18/2012   2014    Human papilloma virus    Hypertension    Insomnia    Joint pain    Moderate dysplasia of cervix    Numbness    right arm to hand   Ovarian cyst    Prediabetes    Tubo-ovarian abscess 12/02/2023   Vitamin D  deficiency     Past Surgical History:  Procedure Laterality Date   ABDOMINAL HYSTERECTOMY N/A 12/08/2023   Procedure: HYSTERECTOMY, ABDOMINAL;  Surgeon: Marilynn Nest, DO;  Location: [facility] OR;  Service: Gynecology;  Laterality: N/A;  W/ EXPLORATORY LAPAROTOMY   BREAST BIOPSY Left 2012   benign fibroadeoma   BREAST SURGERY     CESAREAN SECTION N/A 12/13/2012   Procedure: PRIMARY CESAREAN SECTION;  Surgeon: Aida DELENA Na, MD;  Location: [facility] ORS;  Service: Obstetrics;  Laterality: N/A;   LEEP      Allergies: Macrobid  [nitrofurantoin  macrocrystal]  Medications: Prior to Admission medications   Medication Sig Start Date End Date Taking? Authorizing Provider  acetaminophen  (TYLENOL ) 500 MG tablet Take 1,000-1,500 mg by mouth every 6 (six) hours as needed for mild pain (pain score 1-3) or moderate pain (pain score 4-6).   Yes [provider]  ALLERGY RELIEF 180 MG tablet Take by mouth. 06/10/22  Yes [provider]  insulin  aspart (NOVOLOG FLEXPEN) 100 UNIT/ML FlexPen Use sliding scale for TID usage with meals and at bedtime 12/14/23  Yes Zina Jerilynn DELENA, MD  insulin  glargine (LANTUS) 100 unit/mL SOPN Inject 6 Units into the skin  daily. 12/14/23  Yes Zina Jerilynn LABOR, MD  Insulin  Pen Needle 32G X 4 MM MISC use 1 needle as directed then discard 12/14/23  Yes Zina Jerilynn LABOR, MD  acetaminophen  (TYLENOL ) 325 MG tablet Take 2 tablets (650 mg total) by mouth every 6 (six) hours. 12/13/23   Ozan, Jennifer, DO  amLODipine (NORVASC) 5 MG tablet Take 1 tablet (5 mg total) by mouth daily. 12/14/23   Zina Jerilynn LABOR, MD  apixaban (ELIQUIS) 5 MG TABS tablet Take 2 tablets (10  mg total) by mouth 2 (two) times daily for 5 days, THEN 1 tablet (5 mg total) 2 (two) times daily. 12/13/23 02/16/24  Ozan, Jennifer, DO  atorvastatin (LIPITOR) 80 MG tablet Take 1 tablet (80 mg total) by mouth daily. 12/14/23   Ozan, Jennifer, DO  chlorthalidone  (HYGROTON ) 25 MG tablet Take 1 tablet (25 mg total) by mouth daily. 12/14/23   Zina Jerilynn LABOR, MD  doxycycline  (VIBRA -TABS) 100 MG tablet Take 1 tablet (100 mg total) by mouth every 12 (twelve) hours for 14 days. 12/13/23 12/27/23  Ozan, Jennifer, DO  glycopyrrolate  (ROBINUL ) 1 MG tablet Take 1 tablet (1 mg total) by mouth 3 (three) times daily. 12/14/23   Zina Jerilynn LABOR, MD  losartan (COZAAR) 50 MG tablet Take 1 tablet (50 mg total) by mouth daily. 12/14/23   Zina Jerilynn LABOR, MD  metoCLOPramide  (REGLAN ) 5 MG tablet Take 1 tablet (5 mg total) by mouth every 8 (eight) hours for 14 days. 12/14/23 12/28/23  Zina Jerilynn LABOR, MD  metroNIDAZOLE  (FLAGYL ) 500 MG tablet Take 1 tablet (500 mg total) by mouth every 12 (twelve) hours for 14 days. 12/13/23 12/27/23  Ozan, Jennifer, DO  ondansetron  (ZOFRAN ) 4 MG tablet Take 1 tablet (4 mg total) by mouth every 6 (six) hours as needed for nausea or vomiting. 12/14/23   Zina Jerilynn LABOR, MD  oxyCODONE  (OXY IR/ROXICODONE ) 5 MG immediate release tablet Take 1-2 tablets (5-10 mg total) by mouth every 6 (six) hours as needed for up to 7 days for moderate pain (pain score 4-6) or severe pain (pain score 7-10). 12/13/23 12/20/23  Ozan, Jennifer, DO  promethazine  (PHENERGAN ) 25 MG tablet Take 1 tablet (25 mg total) by mouth every 6 (six) hours as needed for refractory nausea / vomiting. 12/13/23   Ozan, Jennifer, DO  scopolamine  (TRANSDERM-SCOP) 1 MG/3DAYS Place 1 patch (1 mg total) onto the skin every 3 (three) days. 12/15/23   Zina Jerilynn LABOR, MD     Family History  Problem Relation Age of Onset   Diabetes Mother    Heart disease Mother    Hypertension Mother    Depression Mother    Stroke Mother     Arthritis Mother    Anxiety disorder Mother    Stroke Father    High blood pressure Father    High Cholesterol Father    Depression Father    Anxiety disorder Father    Obesity Father    Bipolar disorder Father    Asthma Sister    Kidney disease Brother        genetic condition   Hypertension Maternal Grandmother    Dementia Maternal Grandmother    Heart disease Maternal Grandmother    Diabetes Paternal Grandmother    Diabetes Maternal Aunt    Hypertension Maternal Aunt    Asthma Maternal Aunt    Epilepsy Maternal Aunt     Social History   Socioeconomic History   Marital status: Single    Spouse name: Not on  file   Number of children: 1   Years of education: Not on file   Highest education level: 11th grade  Occupational History   Occupation: work at home    Comment: at home  Tobacco Use   Smoking status: Former    Current packs/day: 0.00    Average packs/day: 0.3 packs/day for 5.0 years (1.3 ttl pk-yrs)    Types: Cigarettes    Start date: 12/20/2014    Quit date: 12/20/2019    Years since quitting: 3.9   Smokeless tobacco: Never  Vaping Use   Vaping status: Never Used  Substance and Sexual Activity   Alcohol use: Not Currently    Comment: occ   Drug use: No   Sexual activity: Not Currently    Birth control/protection: Condom  Other Topics Concern   Not on file  Social History Narrative   Lives with her child   Caffeine- green tea, Poweraid, grape juice   Social Drivers of Corporate Investment Banker Strain: Not on File (06/07/2021)   Received from [employer]    Financial Resource Strain: 0  Food Insecurity: No Food Insecurity (12/01/2023)   Hunger Vital Sign    Worried About Running Out of Food in the Last Year: Never true    Ran Out of Food in the Last Year: Never true  Transportation Needs: No Transportation Needs (12/01/2023)   PRAPARE - Administrator, Civil Service (Medical): No    Lack of Transportation  (Non-Medical): No  Physical Activity: Not on File (06/07/2021)   [facility]   Physical Activity    Physical Activity: 0  Stress: Not on File (06/07/2021)   Received from [facility]   Stress    Stress: 0  Social Connections: Not on File (11/04/2022)   Received from [facility]   Social Connections    Connectedness: 0     Review of Systems: A 12 point ROS discussed and pertinent positives are indicated in the HPI above.  All other systems are negative.    Vital Signs: BP 126/89 (BP Location: Right Wrist)   Pulse (!) 103   Temp 99.6 F (37.6 C) (Oral)   Resp 17   Ht 5' 4 (1.626 m)   Wt 300 lb (136.1 kg)   LMP 11/18/2023 (Exact Date)   SpO2 93%   BMI 51.49 kg/m   Advance Care Plan: The advanced care place/surrogate decision maker was discussed at the time of visit and the patient did not wish to discuss or was not able to name a surrogate decision maker or provide an advance care plan.  Physical Exam Vitals and nursing note reviewed.  Constitutional:      Appearance: Normal appearance.  HENT:     Mouth/Throat:     Mouth: Mucous membranes are moist.     Pharynx: Oropharynx is clear.  Cardiovascular:     Rate and Rhythm: Regular rhythm. Tachycardia present.  Pulmonary:     Effort: Pulmonary effort is normal.     Breath sounds: Normal breath sounds.  Abdominal:     Palpations: Abdomen is soft.     Tenderness: There is abdominal tenderness (bil adnexal and suprapubic ttp. mild RUQ ttp).     Comments: Surgical dressing over lower midline abd, appears c/d/I. No significant pain reported at surgical site. X2 JP drains with ~5cc serosanguinous output in each bulb. No significant pain at these drain sites in RLQ and LLQ  Musculoskeletal:  Right lower leg: No edema.     Left lower leg: No edema.  Skin:    General: Skin is warm and dry.  Neurological:     Mental Status: She is alert and oriented to person, place, and time. Mental status is at baseline.     Imaging: CT  ABDOMEN PELVIS W CONTRAST Addendum Date: 12/14/2023  ADDENDUM #1  ADDENDUM: Results were discussed with Dr. Ozen on 12/14/2023 at 07:50 pm. ---------------------------------------------------- Electronically signed by: Greig Pique MD 12/14/2023 07:53 PM EDT RP Workstation: [id number]   Result Date: 12/14/2023 ORIGINAL REPORTEXAM: CT ABDOMEN AND PELVIS WITH CONTRAST 12/14/2023 04:31:19 PM TECHNIQUE: CT of the abdomen and pelvis was performed with the administration of 75 mL of iohexol  (OMNIPAQUE ) 350 MG/ML injection. Multiplanar reformatted images are provided for review. Automated exposure control, iterative reconstruction, and/or weight-based adjustment of the mA/kV was utilized to reduce the radiation dose to as low as reasonably achievable. COMPARISON: CT abdomen and pelvis 12/18/2010 and MRI pelvis 12/02/2023. CLINICAL HISTORY: Abdominal pain, post-op. 75mL Omni 350 IV. Chief complaints; Nausea; CT ABDOMEN PELVIS W CONTRAST; Abdominal pain, post-op FINDINGS: LOWER CHEST: Bands of atelectasis in the bilateral lower lobes. LIVER: The liver is unremarkable. GALLBLADDER AND BILE DUCTS: Gallbladder is unremarkable. No biliary ductal dilatation. SPLEEN: No acute abnormality. PANCREAS: No acute abnormality. ADRENAL GLANDS: No acute abnormality. KIDNEYS, URETERS AND BLADDER: There is new bilateral mild hydroureteronephrosis to the level of the pelvic inlet. No obstructing calculus identified. The bladder is markedly distended and contains a small amount of air. GI AND BOWEL: Stomach demonstrates no acute abnormality. There is no bowel obstruction. The appendix is not visualized. There is wall thickening and inflammation of the sigmoid colon as it abuts pelvic fluid collection. PERITONEUM AND RETROPERITONEUM: There is a new enhancing fluid collection containing multiple bubbles of air in the pelvic cul-de-sac measuring 11 x 5 x 4.8 cm worrisome for abscess. There is marked surrounding inflammatory stranding. There  are additional small cell loculated low attenuation areas abutting and just superior to this larger collection measuring up to 2.4 cm, indeterminate. Second smaller right lower quadrant fluid collection is present measuring 4.1 x 1.8 x 3.4 cm (image 3/74) abutting the anterior abdominal wall and right colon inferiorly. There is no gross free intraperitoneal air. VASCULATURE: Aorta is normal in caliber. LYMPH NODES: No lymphadenopathy. REPRODUCTIVE ORGANS: The uterus is displaced to the left and appears grossly within normal limits. There are 2 percutaneous drainage catheters in place in the pelvis. BONES AND SOFT TISSUES: No acute osseous abnormality. Mild circumscribed lobulated mass in the medial right breast measures up to 2 cm and contains punctate calcifications. There is subcutaneous edema throughout the anterior lower abdominal wall with small bubbles of air compatible with recent surgery. Small air-fluid collection is seen in the deep subcutaneous tissues just right of midline just above the level of the umbilicus measuring 2.4 x 2.1 x 2.7 cm. Tiny air fluid collection is seen in the region of the umbilicus. There is lower anterior abdominal wall skin thickening. IMPRESSION: 1. New enhancing pelvic cul-de-sac fluid collection measuring up to 11 x 5 x 4.8 cm containing multiple gas bubbles, worrisome for abscess; marked surrounding inflammatory stranding. Additional smaller adjacent loculated low-attenuation collections up to 2.4 cm are indeterminate and may represent satellite abscesses. 2. Second smaller right lower quadrant fluid collection (approximately 4.1 x 1.8 x 3.4 cm) abutting the anterior abdominal wall and right colon; correlate clinically for abscess versus postoperative collection. Two percutaneous pelvic drainage catheters in  place. 3. New bilateral mild hydroureteronephrosis to the level of the pelvic inlet without obstructing calculus, likely secondary to pelvic inflammatory process and/or  bladder outlet dysfunction given marked distention. 4. Markedly distended urinary bladder with a small amount of intraluminal air; correlate with recent catheterization versus emphysematous cystitis. 5. Postoperative changes of the anterior lower abdominal wall with diffuse subcutaneous edema and small foci of subcutaneous gas. Small deep subcutaneous air-fluid collection just right of midline above the umbilicus (approximately 2.4 x 2.1 x 2.7 cm) and tiny periumbilical air-fluid collection, compatible with postoperative seromas/hematomas versus superficial abscesses; clinical correlation recommended. 6. Wall thickening of the sigmoid colon with surrounding inflammation, likely reactive colitis. Electronically signed by: Greig Pique MD 12/14/2023 07:22 PM EDT RP Workstation: [id number]   VAS US  LOWER EXTREMITY VENOUS (DVT) Result Date: 12/10/2023  Lower Venous DVT Study Patient Name:  KAYIN KETTERING  Date of Exam:   12/10/2023 Medical Rec #: [id number]         Accession #:    [id number] Date of Birth: 13-May-1990         Patient Gender: F Patient Age:   14 years Exam Location:  [facility] Procedure:      VAS US  LOWER EXTREMITY VENOUS (DVT) Referring Phys: DUFFY AL-SULTANI --------------------------------------------------------------------------------  Indications: Pulmonary embolism, Pain, and Post-op.  Limitations: Body habitus, poor ultrasound/tissue interface and bandages. Comparison Study: No prior exam. Performing Technologist: Edilia Elden Appl  Examination Guidelines: A complete evaluation includes B-mode imaging, spectral Doppler, color Doppler, and power Doppler as needed of all accessible portions of each vessel. Bilateral testing is considered an integral part of a complete examination. Limited examinations for reoccurring indications may be performed as noted. The reflux portion of the exam is performed with the patient in reverse Trendelenburg.   +---------+---------------+---------+-----------+----------+--------------+ RIGHT    CompressibilityPhasicitySpontaneityPropertiesThrombus Aging +---------+---------------+---------+-----------+----------+--------------+ CFV      Partial        Yes      Yes                                 +---------+---------------+---------+-----------+----------+--------------+ SFJ      Full           Yes      Yes                                 +---------+---------------+---------+-----------+----------+--------------+ FV Prox  Partial        Yes      Yes                                 +---------+---------------+---------+-----------+----------+--------------+ FV Mid   Full                                                        +---------+---------------+---------+-----------+----------+--------------+ FV DistalFull                                                        +---------+---------------+---------+-----------+----------+--------------+ PFV  Partial        Yes      Yes                                 +---------+---------------+---------+-----------+----------+--------------+ POP      Full           Yes      Yes                                 +---------+---------------+---------+-----------+----------+--------------+ PTV      Full                                                        +---------+---------------+---------+-----------+----------+--------------+ PERO     Partial        Yes      Yes                                 +---------+---------------+---------+-----------+----------+--------------+ EIV                     Yes      Yes                                 +---------+---------------+---------+-----------+----------+--------------+ Deep vein thrombosis noted in the right common femoral vein at the saphenofemoral junction, proximal femoral, profunda and in one of the paired peroneal veins. No thrombus noted in the great saphenous.   +---------+---------------+---------+-----------+----------+--------------+ LEFT     CompressibilityPhasicitySpontaneityPropertiesThrombus Aging +---------+---------------+---------+-----------+----------+--------------+ CFV      Full           Yes      Yes                                 +---------+---------------+---------+-----------+----------+--------------+ SFJ      Full           Yes      Yes                                 +---------+---------------+---------+-----------+----------+--------------+ FV Prox  Full                                                        +---------+---------------+---------+-----------+----------+--------------+ FV Mid   Full                                                        +---------+---------------+---------+-----------+----------+--------------+ FV DistalFull                                                        +---------+---------------+---------+-----------+----------+--------------+  PFV      Full                                                        +---------+---------------+---------+-----------+----------+--------------+ POP      Full           Yes      Yes                                 +---------+---------------+---------+-----------+----------+--------------+ PTV      Partial        No       No                                  +---------+---------------+---------+-----------+----------+--------------+ PERO     Partial        No       No                                  +---------+---------------+---------+-----------+----------+--------------+ Deep vein thrombosis noted in one of the paired posterior tibial veins and one of the paired peroneal veins.    Summary: RIGHT: - Findings consistent with acute deep vein thrombosis involving the right common femoral vein, SF junction, right femoral vein, right proximal profunda vein, and right peroneal veins.  - No cystic structure found in the  popliteal fossa.  LEFT: - Findings consistent with acute deep vein thrombosis involving the left posterior tibial veins, and left peroneal veins.  - No cystic structure found in the popliteal fossa.  *See table(s) above for measurements and observations. Electronically signed by Gaile New MD on 12/10/2023 at 7:28:04 PM.    Final    CT Angio Chest Pulmonary Embolism (PE) W or WO Contrast Addendum Date: 12/09/2023 ADDENDUM REPORT: 12/09/2023 15:33 ADDENDUM: Critical Value/emergent results were called by telephone at the time of interpretation on 12/09/2023 at 3:06 p.m. to provider DELON PRUDE , who verbally acknowledged these results. Electronically Signed   By: Toribio Agreste M.D.   On: 12/09/2023 15:33   Result Date: 12/09/2023 CLINICAL DATA:  Dyspnea with recent surgery. Possible pulmonary embolism. EXAM: CT ANGIOGRAPHY CHEST WITH CONTRAST TECHNIQUE: Multidetector CT imaging of the chest was performed using the standard protocol during bolus administration of intravenous contrast. Multiplanar CT image reconstructions and MIPs were obtained to evaluate the vascular anatomy. RADIATION DOSE REDUCTION: This exam was performed according to the departmental dose-optimization program which includes automated exposure control, adjustment of the mA and/or kV according to patient size and/or use of iterative reconstruction technique. CONTRAST:  75mL OMNIPAQUE  IOHEXOL  350 MG/ML SOLN COMPARISON:  None Available. FINDINGS: Cardiovascular: Heart is normal size. Thoracic aorta is normal in caliber. Pulmonary arterial system is adequately opacified and demonstrates emboli over a subsegmental left lower lobar artery. There are also emboli over the proximal right upper/middle lobe arteries. RV/LV ratio is 3.8/4.0 = 0.95. Remaining vascular structures are unremarkable. Mediastinum/Nodes: No mediastinal or hilar adenopathy. Remaining mediastinal structures are normal. Lungs/Pleura: Lungs are adequately inflated with  bibasilar linear atelectasis. Mild linear atelectasis over the right upper lobe. No effusion. Airways are normal. Upper Abdomen: No acute findings over the visualized upper  abdomen. Musculoskeletal: No focal bony abnormality. Oval 2.5 cm mass with a few coarse calcifications over the medial right breast. Review of the MIP images confirms the above findings. IMPRESSION: 1. Evidence of pulmonary emboli over the proximal right upper/middle lobe arteries and subsegmental left lower lobar artery. No evidence of right heart strain. 2. Bibasilar and right upper lobe linear atelectasis. 3. 2.5 cm oval mass with a few coarse calcifications over the medial right breast. Recommend diagnostic mammographic correlation. Paging ordering physician. Electronically Signed: By: Toribio Agreste M.D. On: 12/09/2023 13:39   DG Chest Port 1 View Result Date: 12/03/2023 EXAM: 1 VIEW(S) XRAY OF THE CHEST 12/03/2023 08:34:00 AM COMPARISON: 12/01/2023 CLINICAL HISTORY: Dyspnea FINDINGS: LUNGS AND PLEURA: Poor inspiratory effort. No focal infiltrate is seen. Left basilar atelectasis has resolved. HEART AND MEDIASTINUM: No acute abnormality of the cardiac and mediastinal silhouettes. BONES AND SOFT TISSUES: No acute osseous abnormality. IMPRESSION: 1. Resolution of prior left basilar atelectasis. Electronically signed by: Oneil Devonshire MD 12/03/2023 11:45 AM EDT RP Workstation: [id number]   MR PELVIS W WO CONTRAST Result Date: 12/02/2023 CLINICAL DATA:  Pelvic pain, nausea, and vomiting. Pelvic mass on recent CT. EXAM: MRI PELVIS WITHOUT AND WITH CONTRAST TECHNIQUE: Multiplanar multisequence MR imaging of the pelvis was performed both before and after administration of intravenous contrast. CONTRAST:  10mL GADAVIST GADOBUTROL 1 MMOL/ML IV SOLN COMPARISON:  CT on 12/01/2023 FINDINGS: Lower Urinary Tract: Bladder is displaced anteriorly by large cystic mass in central pelvis, however, no intrinsic bladder abnormality identified. Bowel:  Unremarkable pelvic bowel loops. Vascular/Lymphatic: Unremarkable. No pathologically enlarged pelvic lymph nodes identified. Reproductive: -- Uterus: Measures 11.2 x 3.5 by 5.5 cm. Displaced anteriorly and to the left by large cystic mass in central pelvis. No otherwise unremarkable in appearance. -- Right ovary: No normal ovary visualized. A complex cystic lesion is seen which is centered in the right adnexa, displacing the rectum and uterus to the left. This lesion contains multiple internal cystic components with T1 hypointense fluid and peripheral enhancement, some of which appear tubular in shape. An internal air-fluid level is also noted. There is surrounding edema and tiny amount of free fluid. This measures 22.4 by 8.0 by 9.8 cm. This is highly suspicious for a large tubo-ovarian abscess, with cystic ovarian neoplasm and endometriosis considered less likely. -- Left ovary:  No normal ovary visualized. Other: Tiny amount of free fluid seen in intraperitoneal and presacral spaces. Musculoskeletal:  Unremarkable. IMPRESSION: 22.4 cm complex cystic mass centered in right adnexa. Features are highly suspicious for a large tubo-ovarian abscess, with cystic ovarian neoplasm and endometriosis considered less likely. Electronically Signed   By: Norleen DELENA Kil M.D.   On: 12/02/2023 16:13   CT ABDOMEN PELVIS W CONTRAST Result Date: 12/01/2023 EXAM: CT ABDOMEN AND PELVIS WITH CONTRAST 12/01/2023 04:32:27 PM TECHNIQUE: CT of the abdomen and pelvis was performed with the administration of 100 mL of iohexol  (OMNIPAQUE ) 300 MG/ML solution. Multiplanar reformatted images are provided for review. Automated exposure control, iterative reconstruction, and/or weight-based adjustment of the mA/kV was utilized to reduce the radiation dose to as low as reasonably achievable. COMPARISON: Comparison with 02/13/2022 and ultrasound 08/14/2022. CLINICAL HISTORY: N/V. Eval for pyelonephritis vs nephrolithiasis. Triage Notes: Patient  BIB [facility] from home. Abdominal pain for 5 days, nausea and vomiting. 3 days ago prescribed antibiotic for UTI but trouble keeping the medication down. Said her urine culture came back positive today. Had covid 1 week ago. FINDINGS: LOWER CHEST: No acute abnormality. LIVER: Hepatic steatosis. GALLBLADDER AND  BILE DUCTS: Gallbladder is unremarkable. No biliary ductal dilatation. SPLEEN: No acute abnormality. PANCREAS: No acute abnormality. ADRENAL GLANDS: No acute abnormality. KIDNEYS, URETERS AND BLADDER: No stones in the kidneys or ureters. No hydronephrosis. No perinephric or periureteral stranding. Urinary bladder is unremarkable. GI AND BOWEL: Stomach demonstrates no acute abnormality. There is no bowel obstruction. Normal appendix. PERITONEUM AND RETROPERITONEUM: No ascites. No free air. VASCULATURE: Aorta is normal in caliber. LYMPH NODES: No lymphadenopathy. REPRODUCTIVE ORGANS: Right hydrosalpinx has increased compared to 02/13/2022. Comparison with ultrasound 08/14/2022 is limited due to differences in technique. Complex right ovarian cystic lesion has increased from the prior CT and measures 6.1 x 5.2 cm. There is a new low-density cystic lesion measuring 7.2 x 5.3 x 7.7 cm. The lesion is superior to the uterus and bladder. The origin is unclear. This may arise from the complex right paraovarian cyst or the left ovary. BONES AND SOFT TISSUES: No acute osseous abnormality. No focal soft tissue abnormality. IMPRESSION: 1. New low-density cystic lesion measuring 7.7 cm, superior to the uterus and bladder, with unclear origin; may arise from the complex right paraovarian cyst or the left ovary. 2. Complex right ovarian cystic lesion has increased from the prior CT and measures 6.1 cm. 3. Right hydrosalpinx has increased compared to prior CT. 4. Recommend gynecologic consultation and/or MRI w/ IV contrast for further characterization . Electronically signed by: Norman Gatlin MD 12/01/2023 05:20 PM EDT RP  Workstation: [id number]   DG Chest Portable 1 View Result Date: 12/01/2023 CLINICAL DATA:  Shortness of breath, sepsis EXAM: PORTABLE CHEST 1 VIEW COMPARISON:  09/19/2016 FINDINGS: Low lung volumes are present, causing crowding of the pulmonary vasculature. Bandlike opacity just above the left hemidiaphragm probably representing atelectasis in the left lower lobe. Hazy density along the right hemithorax likely mostly attributable to soft tissues of the chest wall and the underlying low lung volumes, no discrete airspace opacity identified on the right. No blunting the costophrenic angles. No significant bony findings. IMPRESSION: 1. Low lung volumes are present, causing crowding of the pulmonary vasculature. 2. Bandlike opacity just above the left hemidiaphragm probably representing atelectasis in the left lower lobe. Electronically Signed   By: Ryan Salvage M.D.   On: 12/01/2023 13:44    Labs:  CBC: Recent Labs    12/12/23 0437 12/13/23 0415 12/14/23 0723 12/15/23 0416  WBC 15.4* 15.3* 15.7* 13.9*  HGB 8.6* 8.9* 8.7* 9.2*  HCT 26.5* 27.7* 27.1* 28.3*  PLT 394 404* 394 387    COAGS: Recent Labs    12/01/23 1312 12/05/23 0730 12/08/23 0436  INR 1.3* 1.2 1.2    BMP: Recent Labs    12/13/23 0415 12/13/23 1740 12/14/23 0723 12/15/23 0416  NA 134* 135 134* 133*  K 3.3* 3.7 3.4* 3.5  CL 97* 99 96* 94*  CO2 24 25 24 26   GLUCOSE 162* 168* 178* 142*  BUN 7 8 10 9   CALCIUM  8.7* 9.0 8.9 8.9  CREATININE 1.00 1.00 1.05* 1.04*  GFRNONAA >60 >60 >60 >60    LIVER FUNCTION TESTS: Recent Labs    12/10/23 0430 12/11/23 0057 12/12/23 0437 12/13/23 0415  BILITOT 0.6 0.5 0.5 0.5  AST 48* 47* 42* 41  ALT 23 25 23 24   ALKPHOS 55 49 46 48  PROT 6.3* 6.7 6.4* 6.3*  ALBUMIN 2.1* 2.2* 2.2* 2.3*    TUMOR MARKERS: No results for input(s): AFPTM, CEA, CA199, CHROMGRNA in the last 8760 hours.  Assessment and Plan:  Plan for new pelvic drain  placement as described  above  Pt currently on eliquis, will be bridged to heparin for procedure. Due to need for 48h eliquis hold, will look to tentatively proceed 12/17/23.  Risks and benefits discussed with the patient including bleeding, infection, damage to adjacent structures, bowel perforation/fistula connection, and sepsis. All of the patient's questions were answered, patient is agreeable to proceed. Consent signed and in chart.   Thank you for allowing our service to participate in ASHIMA SHRAKE 's care.  Electronically Signed: Kimble VEAR Clas, PA-C   12/15/2023, 11:20 AM      I spent a total of 20 Minutes    in face to face in clinical consultation, greater than 50% of which was counseling/coordinating care for pelvic drain placement

## 2023-12-15 NOTE — Plan of Care (Signed)
   Problem: Education: Goal: Ability to describe self-care measures that may prevent or decrease complications (Diabetes Survival Skills Education) will improve Outcome: Progressing Goal: Individualized Educational Video(s) Outcome: Progressing   Problem: Coping: Goal: Ability to adjust to condition or change in health will improve Outcome: Progressing   Problem: Fluid Volume: Goal: Ability to maintain a balanced intake and output will improve Outcome: Progressing   Problem: Skin Integrity: Goal: Risk for impaired skin integrity will decrease Outcome: Progressing

## 2023-12-15 NOTE — Progress Notes (Signed)
 Progress Note   Patient: Misty Walsh FMW:[id number] DOB: 07/15/1990 DOA: 12/01/2023     14 DOS: the patient was seen and examined on 12/15/2023   Assessment and Plan:  #Acute PE #Acute bilateral DVTs - Noted to be tachypneic with worsening dyspnea.  Found to have acute PE. - CTA chest (12/10/2023) showed PE over the proximal right upper/middle lobe arteries and subsegmental left lower lobar artery with no evidence of right heart strain. - Etiology unclear, likely multifactorial given significant immobility, obesity, acute illness - Bilateral LE duplex showed acute bilateral DVTs with proximal involvement on the right - Vascular surgery consulted due to proximal right DVT - spoke to Dr. Magda and Dr. Pearline who indicated no surgical intervention at this time, only anticoagulation - Heparin drip discontinued on 10/23 - Transitioned on 10/23 to treatment dose Eliquis 10 mg BID for 7 days  then 5 mg BID - Recommend at least 3 months of anticoagulation for likely provoked DVT given significantly reduced mobility. May benefit from referral to outpatient hematology for further delineation of appropriate length of anticoagulation  #Acute hypoxic respiratory failure - resolved - Noted to be tachypneic with worsening dyspnea and was ultimately found to have acute PE, possibly complicated further by OHS - On room air  #Sepsis secondary to large tubo-ovarian abscess - Presented with fever, leukocytosis, noted to have large tubo-ovarian abscess - Blood cultures (10/13) negative - Afebrile, leukocytosis improving - Completed 5 days of rocephin  - On doxycycline  and Flagyl  for TOA as below  # Large tubo-ovarian abscess status post ex lap w/ washout - POD 7 - Surgical pathology showed benign cyst wall with cyst lining dilatation.  Negative for malignancy - Completed 5 days of Rocephin  - On doxycycline  and Flagyl   - Encouraged OOB - Incentive spirometry - CTA/P with large pelvic cul-de-sac  abscess (approximately 11 x 5 x 4.8 cm) with surrounding formation and small adjacent satellite collections: Additional RLQ collection (4 cm) near the abdominal wall possibly abscess versus postop fluid.  Mild bilateral hydroureteronephrosis likely from pelvic inflammation, bladder markedly distended with some air (postcatheterization versus emphysematous cystitis).  Postop anterior abdominal wall changes with small superficial air-fluid collections (seroma/hematoma versus superficial abscess).  Sigmoid wall thickening likely reactive. - Further management per OBGYN - IR to place drains  # Persistent nausea and vomiting # Excessive oral secretions - Reports persistent nausea and vomiting as well as excessive oral secretions - Already on scheduled Reglan  5 mg q8h, scopolamine  patch, PO Robinul  1 mg TID,  Protonix  40 mg daily, Pepcid  20 mg BID, PRN Zofran , PRN Phenergan  - Could trial scheduled Zofran  or scheduled Phenergan  in favor of scheduled Reglan . Could also try SoluMedrol - Switched from PO to IV Robinul  0.1 mg TID   # Constipation - Miralax  daily and Peri-colace nightly  # AKI - resolved - Etiology likely multifactorial - initially decreased PO intake and sepsis, now post IV contrast exposure and Lasix  - Baseline Cr ~ 0.9.  - Stable at 1.04 today  - Avoiding NSAIDs - Strict I/Os  # Hypokalemia - resolved  #Hypertension - SBP in the 120-130s - Continue chlorthalidone  25 mg daily, Losartan to 50 mg daily, and amlodipine 5 mg daily  # Bilateral lower extremity swelling, R > L - Patient reports her legs are more swollen than normal, initially thought due to fluid overload and was receiving Lasix, without much improvement - However, now found to have acute bilateral DVTs, likely the main driver of her BLE swelling -  Will continue to monitor and hold off additional Lasix at this time  # Right breast mass - Incidental finding of 2.5 cm oval mass with few coarse calcifications over the  medial right breast was noted on CTA chest - Recommend follow-up with PCP or OB/GYN outpatient diagnostic mammography  #T2DM - Hgb A1c 6.3 (12/02/2023), well-controlled at home - no dispense history for glycemic control agents - Received dexamethasone  intraoperatively and had postop hyperglycemia - Continue glargine 6 units daily (Started on 10/21) - Reduced SSI to 0-15 AC and 0-5 HS - Unlikely this patient will need insulin  at discharge given her Hgb A1c.  Discussed starting metformin the patient --she has tried that in the past with significant GI symptoms.  Offered metformin ER, she still declined.  Recommend follow-up with PCP and continued lifestyle and dietary modifications and weight loss  # HLD - Continue Lipitor 80 mg daily  #Debility - PT/OT following  #Class III / Morbid Obesity (BMI Body mass index is 51.49 kg/m. kg/m) - Obesity is clinically significant and contributes to T2DM and HTN.  - Discussed lifestyle modification including dietary changes, regular physical activity, and weight reduction strategies.       Subjective: Patient seen at bedside this morning.  She still suffering from refractory nausea and excessive oral secretions, with occasional vomiting.  She is not finding any relief with the current medications and is unable to sleep or rest because of it.  She denies shortness of breath, chest pain, fevers, chills.  Physical Exam: Vitals:   12/14/23 1655 12/14/23 2029 12/15/23 0405 12/15/23 0611  BP: (!) 137/98 (!) 145/78 (!) 145/80   Pulse: 98 (!) 121 (!) 106   Resp: 19 18 18    Temp: 98.4 F (36.9 C) 99.1 F (37.3 C) 98 F (36.7 C) 100 F (37.8 C)  TempSrc: Oral Oral Oral Oral  SpO2: 96%     Weight:      Height:       Physical Exam Constitutional:      General: She is not in acute distress.    Appearance: She is obese. She is not ill-appearing.     Interventions: Nasal cannula in place.  Cardiovascular:     Rate and Rhythm: Normal rate and  regular rhythm.     Heart sounds: Normal heart sounds. No murmur heard. Pulmonary:     Effort: Pulmonary effort is normal. No respiratory distress.     Comments: Unable to evaluate posterior lung fields Abdominal:     General: There is no distension.     Palpations: Abdomen is soft.     Tenderness: There is abdominal tenderness (TTP to mid and lower abdomen). There is no guarding.  Genitourinary:    Comments: JP drains x 2 in place, minimal serosanguineous fluid Musculoskeletal:     Right lower leg: No edema.     Left lower leg: No edema.  Skin:    General: Skin is dry.  Neurological:     Mental Status: She is alert and oriented to person, place, and time. Mental status is at baseline.     Family Communication: None  Disposition: Status is: Inpatient Remains inpatient appropriate because: Requiring IV antibiotics for TOA, persistent nausea and vomiting, acute PE/DVT Planned Discharge Destination: Home    DVT Prophylaxis: apixaban (ELIQUIS) tablet 10 mg  apixaban (ELIQUIS) tablet 5 mg   Time spent: 40 minutes  Author: Pearlean Sabina Al-Sultani, MD 12/15/2023 7:32 AM  For on call review [email or web address].

## 2023-12-15 NOTE — Progress Notes (Signed)
 PT Cancellation Note  Patient Details Name: NAKYIAH KUCK MRN: [id number] DOB: 17-Mar-1990   Cancelled Treatment:    Reason Eval/Treat Not Completed: Other (comment). Pt taking meds and wanted to get washed up. Asked me to come back later. Will try again later.   Rodgers ORN [facility] 12/15/2023, 11:00 AM Rodgers Opal PT Acute [employer] [phone]

## 2023-12-15 NOTE — Progress Notes (Addendum)
 Daily Gynecology Note  Admission Date: 12/01/2023 Current Date: 12/15/2023 9:05 AM  Misty Walsh is a 33 y.o. G1P0101 admitted by the [facility] service for possible urosepsis on 10/13, with 20cm pelvic abscess/likely recurrent TOA seen on imaging. Patient started on IV abx and [facility] IR didn't feel able to drain. She was then transferred to [facility] GYN on 10/17 and IR at [facility] didn't feel able to drain, as well.   She was taken to the OR by GYN and Gen Surg on 10/20 and had a ex-lap via vertical midline/LOA/removal of left adnexal cyst wall/washout/JP drain placement x 2 (left and right posterior gutters) and closed with staples. Operative findings significant for extensive adhesions, difficulty identifying GYN anatomy with large amount of sero-sang fluid during surgery; surg path of cyst wall, cultures and gram stain all negative, as well as final admit blood cultures.   History complicated by: Patient Active Problem List   Diagnosis Date Noted   Sepsis (HCC) 12/09/2023   AKI (acute kidney injury) 12/09/2023   Acute hypoxic respiratory failure (HCC) 12/09/2023   S/P exploratory laparotomy 12/09/2023   Tubo-ovarian abscess 12/02/2023   Acute pyelonephritis 12/02/2023   BMI 50.0-59.9, adult (HCC) 12/02/2023   Obesity, class 3 (HCC) 12/02/2023   Sepsis due to gram-negative UTI (HCC) 12/01/2023   Metabolic dysfunction-associated steatotic liver disease (MASLD) 12/26/2022   Class 3 severe obesity with serious comorbidity and body mass index (BMI) of 50.0 to 59.9 in adult (HCC) 11/20/2022   Physically inactive 11/20/2022   Cyst of right ovary 06/05/2022   Episodic cluster headache, not intractable 04/09/2022   Hypoventilation associated with obesity syndrome (HCC) 04/09/2022   Sleep-related headache 04/09/2022   Loud snoring 04/09/2022   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/2022   Anxiety and depression 04/09/2022   Adnexal mass 03/20/2022   Type 2 diabetes mellitus  without complication, without long-term current use of insulin  (HCC) 02/08/2021   Symptomatic mammary hypertrophy 12/07/2019   Back pain 12/07/2019   Neck pain 12/07/2019   Prediabetes 11/04/2019   Hyperlipemia 11/04/2019   Vitamin D  deficiency 11/04/2019   Primary hypertension 03/22/2019   Hemorrhagic ovarian cyst 06/21/2017   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   Cellulitis 01/06/2013   History of cesarean section, classical 12/18/2012   Allergy 10/29/2012   Overnight/24hr events:  Had repeat CT (see below)  Subjective:  Pt states she felt hot/flushed yesterday (no temp) but nothing currently. Also, no current chest pain, SOB. +flatus and BMs and pain is controlled with PO. She is also take PO w/o issue. She does endorse increased oral secretions.   Objective:    Current Vital Signs 24h Vital Sign Ranges  T 99.6 F (37.6 C) Temp  Avg: 99 F (37.2 C)  Min: 98 F (36.7 C)  Max: 100 F (37.8 C)  BP 126/89 BP  Min: 126/89  Max: 145/80  HR (!) 103 Pulse  Avg: 107  Min: 98  Max: 121  RR 17 Resp  Avg: 18  Min: 17  Max: 19  SaO2 93 % Room Air SpO2  Avg: 94.5 %  Min: 93 %  Max: 96 %       24 Hour I/O Current Shift I/O  Time Ins Outs 10/26 0701 - 10/27 0700 In: -  Out: 415 [Urine:400; Drains:15] No intake/output data recorded.   Patient Vitals for the past 24 hrs:  BP Temp Temp src Pulse Resp SpO2  12/15/23 0814 126/89 99.6 F (37.6  C) Oral (!) 103 17 93 %  12/15/23 0611 -- 100 F (37.8 C) Oral -- -- --  12/15/23 0405 (!) 145/80 98 F (36.7 C) Oral (!) 106 18 --  12/14/23 2029 (!) 145/78 99.1 F (37.3 C) Oral (!) 121 18 --  12/14/23 1655 (!) 137/98 98.4 F (36.9 C) Oral 98 19 96 %  JP output: 20-57mL/24h for both JPs  Physical exam: General: Well nourished, well developed female in no acute distress. Abdomen: obese, c/d/I dressing with spots of old blood. Nttp, +BS, non distended. Scant sanginous fluid in both JPs.  Cardiovascular: S1, S2 normal, no  murmur, rub or gallop, regular rate and rhythm Respiratory: CTAB Skin: Warm and dry.  Ext: 3+ edema b/l in LEs  Medications: Current Facility-Administered Medications  Medication Dose Route Frequency Provider Last Rate Last Admin   acetaminophen  (TYLENOL ) tablet 650 mg  650 mg Oral Q6H Ozan, Jennifer, DO   650 mg at 12/15/23 9391   alum & mag hydroxide-simeth (MAALOX/MYLANTA) 200-200-20 MG/5ML suspension 30 mL  30 mL Oral Q4H PRN Jadine Toribio SQUIBB, MD   30 mL at 12/03/23 0321   amLODipine (NORVASC) tablet 5 mg  5 mg Oral Daily Al-Sultani, Anmar, MD   5 mg at 12/14/23 1050   apixaban (ELIQUIS) tablet 10 mg  10 mg Oral BID Al-Sultani, Anmar, MD   10 mg at 12/14/23 2147   Followed by   NOREEN ON 12/18/2023] apixaban (ELIQUIS) tablet 5 mg  5 mg Oral BID Al-Sultani, Anmar, MD       atorvastatin (LIPITOR) tablet 80 mg  80 mg Oral Daily Lue Elsie BROCKS, MD   80 mg at 12/14/23 1050   chlorthalidone  (HYGROTON ) tablet 25 mg  25 mg Oral Daily Lue Elsie BROCKS, MD   25 mg at 12/14/23 1050   diphenhydrAMINE  (BENADRYL ) injection 50 mg  50 mg Intravenous Q8H PRN Ozan, Jennifer, DO   50 mg at 12/09/23 1652   doxycycline  (VIBRA -TABS) tablet 100 mg  100 mg Oral Q12H Zina Jerilynn LABOR, MD   100 mg at 12/14/23 2145   gabapentin (NEURONTIN) capsule 300 mg  300 mg Oral TID Ozan, Jennifer, DO   300 mg at 12/14/23 2145   glycopyrrolate  (ROBINUL ) tablet 1 mg  1 mg Oral TID Zina Jerilynn LABOR, MD   1 mg at 12/14/23 2145   hydrALAZINE (APRESOLINE) injection 5 mg  5 mg Intravenous Q6H PRN Jadine Toribio SQUIBB, MD   5 mg at 12/04/23 9090   insulin  aspart (novoLOG) injection 0-20 Units  0-20 Units Subcutaneous TID WC Arlon Carliss ORN, DO   4 Units at 12/14/23 1745   insulin  aspart (novoLOG) injection 0-5 Units  0-5 Units Subcutaneous QHS Arlon Carliss W, DO       insulin  glargine-yfgn [facility]) injection 6 Units  6 Units Subcutaneous Daily Arlon Carliss ORN, DO   6 Units at 12/14/23 1052   loperamide (IMODIUM)  capsule 2 mg  2 mg Oral PRN Arlon Carliss ORN, DO   2 mg at 12/08/23 0951   losartan (COZAAR) tablet 50 mg  50 mg Oral Daily Al-Sultani, Anmar, MD   50 mg at 12/14/23 1049   menthol  (CEPACOL) lozenge 3 mg  1 lozenge Oral PRN Ozan, Jennifer, DO   3 mg at 12/10/23 2053   metoCLOPramide  (REGLAN ) tablet 5 mg  5 mg Oral Q8H Zina Jerilynn LABOR, MD   5 mg at 12/15/23 9391   metroNIDAZOLE  (FLAGYL ) tablet 500 mg  500 mg Oral Q12H Zina Jerilynn  A, MD   500 mg at 12/14/23 2147   ondansetron  (ZOFRAN ) tablet 4 mg  4 mg Oral Q6H PRN Al-Sultani, Anmar, MD   4 mg at 12/14/23 0827   Oral care mouth rinse  15 mL Mouth Rinse PRN Jadine Toribio SQUIBB, MD       oxyCODONE  (Oxy IR/ROXICODONE ) immediate release tablet 5-10 mg  5-10 mg Oral Q4H PRN Lue Elsie BROCKS, MD   10 mg at 12/14/23 0531   pantoprazole  (PROTONIX ) EC tablet 40 mg  40 mg Oral Daily Lue Elsie BROCKS, MD   40 mg at 12/14/23 1049   [START ON 12/16/2023] polyethylene glycol (MIRALAX  / GLYCOLAX ) packet 17 g  17 g Oral QODAY Yaniyah Koors, MD       promethazine  (PHENERGAN ) tablet 25 mg  25 mg Oral Q6H PRN Zina Jerilynn LABOR, MD   25 mg at 12/15/23 0850   scopolamine  (TRANSDERM-SCOP) 1 MG/3DAYS 1 mg  1 patch Transdermal Q72H Zina Jerilynn LABOR, MD   1 mg at 12/12/23 1651   senna-docusate (Senokot-S) tablet 1 tablet  1 tablet Oral QHS PRN Izell Harari, MD       Labs:  CBGs 140s-170s Recent Labs  Lab 12/13/23 0415 12/14/23 0723 12/15/23 0416  WBC 15.3* 15.7* 13.9*  HGB 8.9* 8.7* 9.2*  HCT 27.7* 27.1* 28.3*  PLT 404* 394 387   Recent Labs  Lab 12/11/23 0057 12/12/23 0437 12/13/23 0415 12/13/23 1740 12/14/23 0723 12/15/23 0416  NA 136 133* 134* 135 134* 133*  K 3.4* 3.0* 3.3* 3.7 3.4* 3.5  CL 96* 96* 97* 99 96* 94*  CO2 26 25 24 25 24 26   BUN 12 9 7 8 10 9   CREATININE 0.99 1.04* 1.00 1.00 1.05* 1.04*  CALCIUM  8.4* 8.6* 8.7* 9.0 8.9 8.9  PROT 6.7 6.4* 6.3*  --   --   --   BILITOT 0.5 0.5 0.5  --   --   --   ALKPHOS 49 46 48  --   --    --   ALT 25 23 24   --   --   --   AST 47* 42* 41  --   --   --   GLUCOSE 154* 165* 162* 168* 178* 142*   Radiology:   **ADDENDUM #1 ** ADDENDUM: Results were discussed with Dr. Ozen on 12/14/2023 at 07:50 pm.   ----------------------------------------------------   Electronically signed by: Greig Pique MD 12/14/2023 07:53 PM EDT RP Workstation: [id number]    Addended by Pique Greig, MD on 12/14/2023  7:53 PM    Study Result  Narrative & Impression  ** ORIGINAL REPORT ** EXAM: CT ABDOMEN AND PELVIS WITH CONTRAST 12/14/2023 04:31:19 PM   TECHNIQUE: CT of the abdomen and pelvis was performed with the administration of 75 mL of iohexol  (OMNIPAQUE ) 350 MG/ML injection. Multiplanar reformatted images are provided for review. Automated exposure control, iterative reconstruction, and/or weight-based adjustment of the mA/kV was utilized to reduce the radiation dose to as low as reasonably achievable.   COMPARISON: CT abdomen and pelvis 12/18/2010 and MRI pelvis 12/02/2023.   CLINICAL HISTORY: Abdominal pain, post-op. 75mL Omni 350 IV. Chief complaints; Nausea; CT ABDOMEN PELVIS W CONTRAST; Abdominal pain, post-op   FINDINGS:   LOWER CHEST: Bands of atelectasis in the bilateral lower lobes.   LIVER: The liver is unremarkable.   GALLBLADDER AND BILE DUCTS: Gallbladder is unremarkable. No biliary ductal dilatation.   SPLEEN: No acute abnormality.   PANCREAS: No acute abnormality.   ADRENAL GLANDS: No  acute abnormality.   KIDNEYS, URETERS AND BLADDER: There is new bilateral mild hydroureteronephrosis to the level of the pelvic inlet. No obstructing calculus identified. The bladder is markedly distended and contains a small amount of air.   GI AND BOWEL: Stomach demonstrates no acute abnormality. There is no bowel obstruction. The appendix is not visualized. There is wall thickening and inflammation of the sigmoid colon as it abuts pelvic fluid collection.    PERITONEUM AND RETROPERITONEUM: There is a new enhancing fluid collection containing multiple bubbles of air in the pelvic cul-de-sac measuring 11 x 5 x 4.8 cm worrisome for abscess. There is marked surrounding inflammatory stranding. There are additional small cell loculated low attenuation areas abutting and just superior to this larger collection measuring up to 2.4 cm, indeterminate. Second smaller right lower quadrant fluid collection is present measuring 4.1 x 1.8 x 3.4 cm (image 3/74) abutting the anterior abdominal wall and right colon inferiorly. There is no gross free intraperitoneal air.   VASCULATURE: Aorta is normal in caliber.   LYMPH NODES: No lymphadenopathy.   REPRODUCTIVE ORGANS: The uterus is displaced to the left and appears grossly within normal limits. There are 2 percutaneous drainage catheters in place in the pelvis.   BONES AND SOFT TISSUES: No acute osseous abnormality. Mild circumscribed lobulated mass in the medial right breast measures up to 2 cm and contains punctate calcifications. There is subcutaneous edema throughout the anterior lower abdominal wall with small bubbles of air compatible with recent surgery. Small air-fluid collection is seen in the deep subcutaneous tissues just right of midline just above the level of the umbilicus measuring 2.4 x 2.1 x 2.7 cm. Tiny air fluid collection is seen in the region of the umbilicus. There is lower anterior abdominal wall skin thickening.   IMPRESSION: 1. New enhancing pelvic cul-de-sac fluid collection measuring up to 11 x 5 x 4.8 cm containing multiple gas bubbles, worrisome for abscess; marked surrounding inflammatory stranding. Additional smaller adjacent loculated low-attenuation collections up to 2.4 cm are indeterminate and may represent satellite abscesses. 2. Second smaller right lower quadrant fluid collection (approximately 4.1 x 1.8 x 3.4 cm) abutting the anterior abdominal wall and right  colon; correlate clinically for abscess versus postoperative collection. Two percutaneous pelvic drainage catheters in place. 3. New bilateral mild hydroureteronephrosis to the level of the pelvic inlet without obstructing calculus, likely secondary to pelvic inflammatory process and/or bladder outlet dysfunction given marked distention. 4. Markedly distended urinary bladder with a small amount of intraluminal air; correlate with recent catheterization versus emphysematous cystitis. 5. Postoperative changes of the anterior lower abdominal wall with diffuse subcutaneous edema and small foci of subcutaneous gas. Small deep subcutaneous air-fluid collection just right of midline above the umbilicus (approximately 2.4 x 2.1 x 2.7 cm) and tiny periumbilical air-fluid collection, compatible with postoperative seromas/hematomas versus superficial abscesses; clinical correlation recommended. 6. Wall thickening of the sigmoid colon with surrounding inflammation, likely reactive colitis.   Electronically signed by: Greig Pique MD 12/14/2023 07:22 PM EDT RP Workstation: [id number]    Assessment & Plan:  Patient stable *GYN: see below. Remove staples pod# [id number]. Hospitalist team following along, given numerous co-morbidities.  *ID: continue PO doxy (D#3) & flagyl  (D#3).  I d/w her that I will reach out to IR to have them evaluate her imaging from yesterday and see if anything looks amenable to drainage now.  -s/p rocephin  10/13-10/22; cefoxitin x 1 on 10/20; doxy IV 10/13-10/25; flagyl  IV 10/14-10/24; zosyn x 1 on 10/13 *Heme: on  eliquis bid currently, for a few more days. Follow up to see if an outpatient Heme referral has been made.  -d/w vascular surgery and medication management only recommended *Pulm: see above. No current issues.  *CV: continue current regimen *Endo: continue current regimen *BMI 50s: PT follow and recommended rolling walker and should be fine for going home at discharge.  Follow up any new recommendations.  *FEN/GI: regular diet. Saline lock IV. Pepcid . Miralax  *Dispo: Unsure of dispo. Patient states she lives in a first floor apartment with minimal stairs and will be able to have someone stay with her whenever she is discharged.   Bebe Izell Raddle MD Attending Center for [facility] GYN Consult Phone: [phone] (M-F, [phone]) & [phone]  (Off hours, weekends, holidays)

## 2023-12-15 NOTE — Consult Note (Addendum)
 PHARMACY - ANTICOAGULATION CONSULT NOTE  Pharmacy Consult for heparin infusion Indication: pulmonary embolus and DVT  Allergies  Allergen Reactions   Macrobid  [Nitrofurantoin  Macrocrystal] Itching    Caused the patient to feel hot.    Patient Measurements: Height: 5' 4 (162.6 cm) Weight: 136.1 kg (300 lb) IBW/kg (Calculated) : 54.7 HEPARIN DW (KG): 88.7  Vital Signs: Temp: 99.6 F (37.6 C) (10/27 0814) Temp Source: Oral (10/27 0814) BP: 126/89 (10/27 0814) Pulse Rate: 103 (10/27 0814)  Labs: Recent Labs    12/13/23 0415 12/13/23 1740 12/14/23 0723 12/15/23 0416  HGB 8.9*  --  8.7* 9.2*  HCT 27.7*  --  27.1* 28.3*  PLT 404*  --  394 387  CREATININE 1.00 1.00 1.05* 1.04*    Estimated Creatinine Clearance: 105 mL/min (A) (by C-G formula based on SCr of 1.04 mg/dL (H)).   Medical History: Past Medical History:  Diagnosis Date   Anxiety    Back pain    Chlamydia    Depression med made her navel itching and  made her sleepy so she quit taking them   Diabetes mellitus without complication (HCC)    Edema, lower extremity    Elevated cholesterol    Fibrocystic breast changes of both breasts    GERD (gastroesophageal reflux disease)    History of cesarean section, classical 12/18/2012   2014    Human papilloma virus    Hypertension    Insomnia    Joint pain    Moderate dysplasia of cervix    Numbness    right arm to hand   Ovarian cyst    Prediabetes    Tubo-ovarian abscess 12/02/2023   Vitamin D  deficiency     Medications:  Scheduled:   acetaminophen   650 mg Oral Q6H   amLODipine  5 mg Oral Daily   atorvastatin  80 mg Oral Daily   chlorthalidone   25 mg Oral Daily   doxycycline   100 mg Oral Q12H   gabapentin  300 mg Oral TID   glycopyrrolate   1 mg Oral TID   insulin  aspart  0-20 Units Subcutaneous TID WC   insulin  aspart  0-5 Units Subcutaneous QHS   insulin  glargine-yfgn  6 Units Subcutaneous Daily   losartan  50 mg Oral Daily   metoCLOPramide    5 mg Oral Q8H   metroNIDAZOLE   500 mg Oral Q12H   pantoprazole   40 mg Oral Daily   [START ON 12/16/2023] polyethylene glycol  17 g Oral QODAY   scopolamine   1 patch Transdermal Q72H    Assessment: Misty Walsh is a 33 year old female initially presenting to the hospital with sepsis 2/2 TOA. Found to have PE and bilateral DVTs this admission and started on heparin drip on 10/21, which was later transitioned to Eliquis on 10/23. Today pharmacy was consulted to switch back to heparin drip for possible drain placement with IR. SCR stable at 1.04, CrCl 105, plts 387. Baseline aPTT and heparin levels ordered for correlation and dose titration given Eliquis therapy.  Last dose of Eliquis was given 10/26 at 2147 Previously therapeutic on heparin 2000 units/hr  Goal of Therapy:  Heparin level 0.3-0.7 units/ml aPTT goal 66-102s Monitor platelets by anticoagulation protocol: Yes   Plan:  Start heparin infusion at 2000 units/hr to start now 10/27 1045 (about 13 hours from last Eliquis dose) No bolus given recent Eliquis administration and pending procedure Heparin level and aPTT ordered for 6 hours after starting heparin infusion   Tanya Makhlouf, PharmD, BCPPS  10/27/202510:37 AM  1816 Heparin level >1.1 (Reflective of Eliquis doses) and aPTT = 75 seconds. Will continue current dose since aPTT within desired range of 66-102. Will draw aPTT in ~ 6 hours. Will await aPTT and Heparin level to correlate before using Heparin level for dosing purposes.

## 2023-12-15 NOTE — Progress Notes (Signed)
 Physical Therapy Treatment Patient Details Name: Misty Walsh MRN: [id number] DOB: Jun 23, 1990 Today's Date: 12/15/2023   History of Present Illness Pt is 33 year old [facility]  [facility] for nausea and abd pain. Found to have tubo-ovarian abscess. Transferred to [facility] on 12/04/23. Pt underwent exploratory lap, lysis of adhesions and removal of lt cyst wall on 12/08/23. Imaging 10/22 revealed acute PE. Heparin initiated. 10/22 US  revealed BLE DVT. PMH - DM, morbid obesity, HTN    PT Comments  Pt able to amb in hall with supervision primarily for lines. Encouraged pt to walk in halls more throughout the day.     If plan is discharge home, recommend the following: Assistance with cooking/housework;Assist for transportation   Can travel by private vehicle        Equipment Recommendations  Rolling walker (2 wheels) (bariatric - received)    Recommendations for Other Services       Precautions / Restrictions Precautions Precautions: Other (comment) Recall of Precautions/Restrictions: Intact Precaution/Restrictions Comments: abd drains, watch sats Restrictions Weight Bearing Restrictions Per Provider Order: No     Mobility  Bed Mobility               General bed mobility comments: Pt in bathroom with nursing on arrival.    Transfers Overall transfer level: Needs assistance Equipment used: Rolling walker (2 wheels) Transfers: Sit to/from Stand Sit to Stand: Supervision           General transfer comment: increased time, cues for hand placement    Ambulation/Gait Ambulation/Gait assistance: Supervision Gait Distance (Feet): 200 Feet Assistive device: Rolling walker (2 wheels) Gait Pattern/deviations: Step-through pattern, Decreased stride length, Wide base of support Gait velocity: decreased Gait velocity interpretation: 1.31 - 2.62 ft/sec, indicative of limited community ambulator   General Gait Details: Standing rest break x 1   Stairs              Wheelchair Mobility     Tilt Bed    Modified Rankin (Stroke Patients Only)       Balance Overall balance assessment: No apparent balance deficits (not formally assessed)                                          Communication Communication Communication: No apparent difficulties  Cognition Arousal: Alert Behavior During Therapy: Flat affect   PT - Cognitive impairments: No apparent impairments                         Following commands: Intact      Cueing Cueing Techniques: Verbal cues  Exercises      General Comments        Pertinent Vitals/Pain Pain Assessment Pain Assessment: 0-10 Pain Score: 8  Pain Location: abdomen Pain Descriptors / Indicators: Aching, Grimacing Pain Intervention(s): Monitored during session, RN gave pain meds during session    Home Living                          Prior Function            PT Goals (current goals can now be found in the care plan section) Acute Rehab PT Goals Patient Stated Goal: home Progress towards PT goals: Progressing toward goals    Frequency    Min 3X/week      PT  Plan      Co-evaluation              AM-PAC PT 6 Clicks Mobility   Outcome Measure  Help needed turning from your back to your side while in a flat bed without using bedrails?: A Little Help needed moving from lying on your back to sitting on the side of a flat bed without using bedrails?: A Little Help needed moving to and from a bed to a chair (including a wheelchair)?: A Little Help needed standing up from a chair using your arms (e.g., wheelchair or bedside chair)?: A Little Help needed to walk in hospital room?: A Little Help needed climbing 3-5 steps with a railing? : A Little 6 Click Score: 18    End of Session   Activity Tolerance: Patient tolerated treatment well Patient left: in bed;with call bell/phone within reach (sitting EOB) Nurse Communication: Mobility  status PT Visit Diagnosis: Muscle weakness (generalized) (M62.81);Difficulty in walking, not elsewhere classified (R26.2);Pain Pain - part of body:  (abdomen)     Time: 8254-8197 PT Time Calculation (min) (ACUTE ONLY): 17 min  Charges:    $Gait Training: 8-22 mins PT General Charges $$ ACUTE PT VISIT: 1 Visit                     [facility] PT Acute Rehabilitation Services Office [phone]    Rodgers ORN [facility] 12/15/2023, 6:53 PM

## 2023-12-16 ENCOUNTER — Other Ambulatory Visit (HOSPITAL_COMMUNITY): Payer: Self-pay

## 2023-12-16 ENCOUNTER — Inpatient Hospital Stay (HOSPITAL_COMMUNITY): Payer: MEDICAID

## 2023-12-16 DIAGNOSIS — I2690 Septic pulmonary embolism without acute cor pulmonale: Secondary | ICD-10-CM

## 2023-12-16 LAB — GLUCOSE, CAPILLARY
Glucose-Capillary: 137 mg/dL — ABNORMAL HIGH (ref 70–99)
Glucose-Capillary: 140 mg/dL — ABNORMAL HIGH (ref 70–99)
Glucose-Capillary: 140 mg/dL — ABNORMAL HIGH (ref 70–99)
Glucose-Capillary: 156 mg/dL — ABNORMAL HIGH (ref 70–99)

## 2023-12-16 LAB — HEPARIN LEVEL (UNFRACTIONATED): Heparin Unfractionated: 1 [IU]/mL — ABNORMAL HIGH (ref 0.30–0.70)

## 2023-12-16 LAB — BASIC METABOLIC PANEL WITH GFR
Anion gap: 12 (ref 5–15)
BUN: 9 mg/dL (ref 6–20)
CO2: 25 mmol/L (ref 22–32)
Calcium: 8.4 mg/dL — ABNORMAL LOW (ref 8.9–10.3)
Chloride: 97 mmol/L — ABNORMAL LOW (ref 98–111)
Creatinine, Ser: 1.01 mg/dL — ABNORMAL HIGH (ref 0.44–1.00)
GFR, Estimated: >60 mL/min (ref >60)
Glucose, Bld: 154 mg/dL — ABNORMAL HIGH (ref 70–99)
Potassium: 3.4 mmol/L — ABNORMAL LOW (ref 3.5–5.1)
Sodium: 134 mmol/L — ABNORMAL LOW (ref 135–145)

## 2023-12-16 LAB — CBC
HCT: 27.8 % — ABNORMAL LOW (ref 36.0–46.0)
Hemoglobin: 9.2 g/dL — ABNORMAL LOW (ref 12.0–15.0)
MCH: 31.3 pg (ref 26.0–34.0)
MCHC: 33.1 g/dL (ref 30.0–36.0)
MCV: 94.6 fL (ref 80.0–100.0)
Platelets: 415 K/uL — ABNORMAL HIGH (ref 150–400)
RBC: 2.94 MIL/uL — ABNORMAL LOW (ref 3.87–5.11)
RDW: 13.6 % (ref 11.5–15.5)
WBC: 14 K/uL — ABNORMAL HIGH (ref 4.0–10.5)
nRBC: 0 % (ref 0.0–0.2)

## 2023-12-16 LAB — APTT
aPTT: 84 s — ABNORMAL HIGH (ref 24–36)
aPTT: 85 s — ABNORMAL HIGH (ref 24–36)

## 2023-12-16 LAB — PROTIME-INR
INR: 1.3 — ABNORMAL HIGH (ref 0.8–1.2)
Prothrombin Time: 17.2 s — ABNORMAL HIGH (ref 11.4–15.2)

## 2023-12-16 MED ORDER — POTASSIUM CHLORIDE 10 MEQ/100ML IV SOLN
10.0000 meq | INTRAVENOUS | Status: AC
  Administered 2023-12-16 – 2023-12-17 (×3): 10 meq via INTRAVENOUS
  Filled 2023-12-16 (×3): qty 100

## 2023-12-16 MED ORDER — ONDANSETRON HCL 4 MG/2ML IJ SOLN
4.0000 mg | Freq: Four times a day (QID) | INTRAMUSCULAR | Status: DC
  Administered 2023-12-16 – 2023-12-18 (×8): 4 mg via INTRAVENOUS
  Filled 2023-12-16 (×8): qty 2

## 2023-12-16 MED ORDER — PROCHLORPERAZINE EDISYLATE 10 MG/2ML IJ SOLN
10.0000 mg | Freq: Four times a day (QID) | INTRAMUSCULAR | Status: DC | PRN
  Administered 2023-12-17: 10 mg via INTRAVENOUS
  Filled 2023-12-16 (×2): qty 2

## 2023-12-16 MED ORDER — HYDROMORPHONE HCL 1 MG/ML IJ SOLN
1.0000 mg | INTRAMUSCULAR | Status: DC | PRN
  Administered 2023-12-16 – 2023-12-19 (×12): 1 mg via INTRAVENOUS
  Filled 2023-12-16 (×12): qty 1

## 2023-12-16 MED ORDER — METRONIDAZOLE 500 MG/100ML IV SOLN
500.0000 mg | Freq: Two times a day (BID) | INTRAVENOUS | Status: DC
  Administered 2023-12-16 – 2023-12-20 (×8): 500 mg via INTRAVENOUS
  Filled 2023-12-16 (×8): qty 100

## 2023-12-16 MED ORDER — POTASSIUM CHLORIDE 10 MEQ/100ML IV SOLN: 10.0000 meq | INTRAVENOUS | Status: AC

## 2023-12-16 MED ORDER — SODIUM CHLORIDE 0.9 % IV SOLN
100.0000 mg | Freq: Two times a day (BID) | INTRAVENOUS | Status: DC
  Administered 2023-12-16 – 2023-12-20 (×8): 100 mg via INTRAVENOUS
  Filled 2023-12-16 (×9): qty 100

## 2023-12-16 MED ORDER — ONDANSETRON HCL 4 MG/2ML IJ SOLN
4.0000 mg | Freq: Four times a day (QID) | INTRAMUSCULAR | Status: DC | PRN
  Administered 2023-12-16: 4 mg via INTRAVENOUS
  Filled 2023-12-16: qty 2

## 2023-12-16 NOTE — Progress Notes (Signed)
 PHARMACY - ANTICOAGULATION CONSULT NOTE  Pharmacy Consult for heparin infusion Indication: pulmonary embolus and DVT  Allergies  Allergen Reactions   Macrobid  [Nitrofurantoin  Macrocrystal] Itching    Caused the patient to feel hot.    Patient Measurements: Height: 5' 4 (162.6 cm) Weight: 136.1 kg (300 lb) IBW/kg (Calculated) : 54.7 HEPARIN DW (KG): 88.7  Vital Signs: Temp: 99.8 F (37.7 C) (10/28 0855) Temp Source: Oral (10/28 0855) BP: 135/92 (10/28 0855) Pulse Rate: 102 (10/28 0855)  Labs: Recent Labs    12/14/23 0723 12/15/23 0416 12/15/23 1428 12/15/23 1428 12/15/23 1814 12/16/23 0141 12/16/23 0849  HGB 8.7* 9.2*  --   --   --   --  9.2*  HCT 27.1* 28.3*  --   --   --   --  27.8*  PLT 394 387  --   --   --   --  415*  APTT  --   --  48*   < > 75* 84* 85*  LABPROT  --   --   --   --   --   --  17.2*  INR  --   --   --   --   --   --  1.3*  HEPARINUNFRC  --   --  >1.10*  --  >1.10*  --  1.00*  CREATININE 1.05* 1.04*  --   --   --   --  1.01*   < > = values in this interval not displayed.    Estimated Creatinine Clearance: 108.2 mL/min (A) (by C-G formula based on SCr of 1.01 mg/dL (H)).   Medical History: Past Medical History:  Diagnosis Date   Anxiety    Back pain    Chlamydia    Depression med made her navel itching and  made her sleepy so she quit taking them   Diabetes mellitus without complication (HCC)    Edema, lower extremity    Elevated cholesterol    Fibrocystic breast changes of both breasts    GERD (gastroesophageal reflux disease)    History of cesarean section, classical 12/18/2012   2014    Human papilloma virus    Hypertension    Insomnia    Joint pain    Moderate dysplasia of cervix    Numbness    right arm to hand   Ovarian cyst    Prediabetes    Tubo-ovarian abscess 12/02/2023   Vitamin D  deficiency     Medications:  Heparin infusion @ 2000 units/hr   Assessment: Misty Walsh is a 33 year old female initially  presenting to the hospital with sepsis 2/2 TOA. Found to have PE and bilateral DVTs this admission and started on heparin drip on 10/21, which was later transitioned to Eliquis on 10/23. Today pharmacy was consulted to switch back to heparin drip for possible drain placement with IR. SCR stable at 1.01, CrCl 108, plts 415. Baseline aPTT and heparin levels ordered for correlation and dose titration given Eliquis therapy.   Last dose of Eliquis was given 10/26 at 2147 Previously therapeutic on heparin 2000 units/hr  Goal of Therapy:  Heparin level 0.3-0.7 units/ml aPTT goal 60-102s Monitor platelets by anticoagulation protocol: Yes   Plan:  Continue heparin infusion at 2000 units/hr Draw aPTT and heparin level with AM labs 10/29. Once heparin level and aPTT correlate, can use heparin level for dose adjustments.  Continue to monitor H&H and platelets  Misty Walsh 12/16/2023,10:13 AM

## 2023-12-16 NOTE — Plan of Care (Signed)

## 2023-12-16 NOTE — Progress Notes (Signed)
 PT Cancellation Note  Patient Details Name: Misty Walsh MRN: [id number] DOB: December 28, 1990   Cancelled Treatment:    Reason Eval/Treat Not Completed: Other (comment). Pt nauseated, in pain, and + emesis. Acute PT to return as able to progress mobility.   Norene Ames, PT, DPT Acute Rehabilitation Services Secure chat preferred Office #: [phone]    Norene CHRISTELLA Ames 12/16/2023, 11:41 AM

## 2023-12-16 NOTE — Progress Notes (Signed)
 Progress Note   Patient: Misty Walsh FMW:[id number] DOB: 08/01/1990 DOA: 12/01/2023     15 DOS: the patient was seen and examined on 12/16/2023   Assessment and Plan:  #Acute PE #Acute bilateral DVTs - Noted to be tachypneic with worsening dyspnea.  Found to have acute PE. - CTA chest (12/10/2023) showed PE over the proximal right upper/middle lobe arteries and subsegmental left lower lobar artery with no evidence of right heart strain. - Etiology unclear, likely multifactorial given significant immobility, obesity, acute illness - Bilateral LE duplex showed acute bilateral DVTs with proximal involvement on the right - Vascular surgery consulted due to proximal right DVT - spoke to Dr. Magda and Dr. Pearline who indicated no surgical intervention at this time, only anticoagulation - Heparin drip discontinued on 10/23 - Transitioned on 10/23 to treatment dose Eliquis 10 mg BID for 7 days  then 5 mg BID. - Back on heparin drip due IR drainage procedure on 10/29 - Recommend at least 3 months of anticoagulation for likely provoked DVT given significantly reduced mobility. May benefit from referral to outpatient hematology for further delineation of appropriate length of anticoagulation  #Acute hypoxic respiratory failure - resolved - Noted to be tachypneic with worsening dyspnea and was ultimately found to have acute PE, possibly complicated further by OHS - On room air  #Sepsis secondary to large tubo-ovarian abscess - Presented with fever, leukocytosis, noted to have large tubo-ovarian abscess - Blood cultures (10/13) negative - Afebrile, leukocytosis improving - Completed 5 days of rocephin  - On doxycycline  and Flagyl  for TOA as below  # Large tubo-ovarian abscess status post ex lap w/ washout - POD 7 - Surgical pathology showed benign cyst wall with cyst lining dilatation.  Negative for malignancy - Completed 5 days of Rocephin  - On doxycycline  and Flagyl   - Encouraged OOB -  Incentive spirometry - CTA/P with large pelvic cul-de-sac abscess (approximately 11 x 5 x 4.8 cm) with surrounding formation and small adjacent satellite collections: Additional RLQ collection (4 cm) near the abdominal wall possibly abscess versus postop fluid.  Mild bilateral hydroureteronephrosis likely from pelvic inflammation, bladder markedly distended with some air (postcatheterization versus emphysematous cystitis).  Postop anterior abdominal wall changes with small superficial air-fluid collections (seroma/hematoma versus superficial abscess).  Sigmoid wall thickening likely reactive. - Further management per OBGYN - IR to place drains tomorrow  # Refractory nausea and vomiting # Refractory excessive oral secretions - Reports persistent nausea and vomiting as well as excessive oral secretions, not responsive to current therapeutics - The switch from PO to IV Robinul  0.1 mg TID helped reduce secretions today. Continue scopolamine  patch - Switched PO to IV Zofran  q8h scheduled, scheduled PO Reglan  5 mg q8h, PRN PO phenergan  25 mg q6h - Continue Protonix  40 mg daily, Pepcid  20 mg BID - Repeat EKG ordered to check QTc  # Constipation - Miralax  daily and Peri-colace nightly  # AKI - resolved - Etiology likely multifactorial - initially decreased PO intake and sepsis, now post IV contrast exposure and Lasix  - Baseline Cr ~ 0.9.  - Stable at 1.04 today  - Avoiding NSAIDs - Strict I/Os  # Hypokalemia  - K 3.4 - IV KCl 10 mEq x 3 ordered  #Hypertension - SBP in the 120-130s - Continue chlorthalidone  25 mg daily, Losartan to 50 mg daily, and amlodipine 5 mg daily  # Bilateral lower extremity swelling, R > L - Patient reports her legs are more swollen than normal, initially thought due to  fluid overload and was receiving Lasix, without much improvement - However, now found to have acute bilateral DVTs, likely the main driver of her BLE swelling - Will continue to monitor and hold off  additional Lasix at this time  # Right breast mass - Incidental finding of 2.5 cm oval mass with few coarse calcifications over the medial right breast was noted on CTA chest - Recommend follow-up with PCP or OB/GYN outpatient diagnostic mammography  #T2DM - Hgb A1c 6.3 (12/02/2023), well-controlled at home - no dispense history for glycemic control agents - Received dexamethasone  intraoperatively and had postop hyperglycemia - Continue glargine 6 units daily (Started on 10/21) - Reduced SSI to 0-15 AC and 0-5 HS - Unlikely this patient will need insulin  at discharge given her Hgb A1c.  Discussed starting metformin the patient --she has tried that in the past with significant GI symptoms.  Offered metformin ER, she still declined.  Recommend follow-up with PCP and continued lifestyle and dietary modifications and weight loss  # HLD - Continue Lipitor 80 mg daily  #Debility - PT/OT following  #Class III / Morbid Obesity (BMI Body mass index is 51.49 kg/m. kg/m) - Obesity is clinically significant and contributes to T2DM and HTN.  - Discussed lifestyle modification including dietary changes, regular physical activity, and weight reduction strategies.       Subjective: Patient seen at bedside this morning.  She still suffering from refractory nausea and excessive oral secretions (although those improved somewhat with IV robinul ), with occasional vomiting.  She is not finding any relief with the current medications and is unable to sleep or rest because of it. She was unable to tolerate any of her PO meds. She denies shortness of breath, chest pain, fevers, chills.  Physical Exam: Vitals:   12/16/23 0855 12/16/23 1314 12/16/23 1627 12/16/23 1955  BP: (!) 135/92 138/83 (!) 128/91 (!) 140/93  Pulse: (!) 102 100 97 (!) 105  Resp: 16 16 18 18   Temp: 99.8 F (37.7 C) 98.1 F (36.7 C) 98.1 F (36.7 C) 99.6 F (37.6 C)  TempSrc: Oral Oral Oral Oral  SpO2: 95% 94% 98% 98%  Weight:       Height:       Physical Exam Constitutional:      General: She is not in acute distress.    Appearance: She is obese. She is not ill-appearing.     Interventions: Nasal cannula in place.  Cardiovascular:     Rate and Rhythm: Normal rate and regular rhythm.     Heart sounds: Normal heart sounds. No murmur heard. Pulmonary:     Effort: Pulmonary effort is normal. No respiratory distress.     Comments: Unable to evaluate posterior lung fields Abdominal:     General: There is no distension.     Palpations: Abdomen is soft.     Tenderness: There is abdominal tenderness (TTP to mid and lower abdomen). There is no guarding.  Genitourinary:    Comments: JP drains x 2 in place, minimal serosanguineous fluid Musculoskeletal:     Right lower leg: No edema.     Left lower leg: No edema.  Skin:    General: Skin is dry.  Neurological:     Mental Status: She is alert and oriented to person, place, and time. Mental status is at baseline.    Family Communication: None  Disposition: Status is: Inpatient Remains inpatient appropriate because: Requiring IV antibiotics for TOA, persistent nausea and vomiting, acute PE/DVT, requiring IR drainage of  intraabdominal collections  Planned Discharge Destination: Home    DVT Prophylaxis:  heparin drip   Time spent: 40 minutes  Author: Georgia Delsignore Al-Sultani, MD 12/16/2023 8:36 PM  For on call review [email or web address].

## 2023-12-16 NOTE — Progress Notes (Signed)
 PHARMACY - ANTICOAGULATION CONSULT NOTE  Pharmacy Consult for heparin infusion Indication: pulmonary embolus and DVT  Allergies  Allergen Reactions   Macrobid  [Nitrofurantoin  Macrocrystal] Itching    Caused the patient to feel hot.    Patient Measurements: Height: 5' 4 (162.6 cm) Weight: 136.1 kg (300 lb) IBW/kg (Calculated) : 54.7 HEPARIN DW (KG): 88.7  Vital Signs: Temp: 98.1 F (36.7 C) (10/28 0027) Temp Source: Oral (10/28 0027) BP: 156/79 (10/28 0027) Pulse Rate: 103 (10/28 0027)  Labs: Recent Labs    12/13/23 0415 12/13/23 1740 12/14/23 0723 12/15/23 0416 12/15/23 1428 12/15/23 1814 12/16/23 0141  HGB 8.9*  --  8.7* 9.2*  --   --   --   HCT 27.7*  --  27.1* 28.3*  --   --   --   PLT 404*  --  394 387  --   --   --   APTT  --   --   --   --  48* 75* 84*  HEPARINUNFRC  --   --   --   --  >1.10* >1.10*  --   CREATININE 1.00 1.00 1.05* 1.04*  --   --   --     Estimated Creatinine Clearance: 105 mL/min (A) (by C-G formula based on SCr of 1.04 mg/dL (H)).   Medical History: Past Medical History:  Diagnosis Date   Anxiety    Back pain    Chlamydia    Depression med made her navel itching and  made her sleepy so she quit taking them   Diabetes mellitus without complication (HCC)    Edema, lower extremity    Elevated cholesterol    Fibrocystic breast changes of both breasts    GERD (gastroesophageal reflux disease)    History of cesarean section, classical 12/18/2012   2014    Human papilloma virus    Hypertension    Insomnia    Joint pain    Moderate dysplasia of cervix    Numbness    right arm to hand   Ovarian cyst    Prediabetes    Tubo-ovarian abscess 12/02/2023   Vitamin D  deficiency     Medications:  Infusions:   heparin 2,000 Units/hr (12/15/23 2338)    Assessment: Expand All Collapse All  PHARMACY - ANTICOAGULATION CONSULT NOTE   Pharmacy Consult for heparin infusion Indication: pulmonary embolus and DVT   Allergies        Allergies  Allergen Reactions   Macrobid  [Nitrofurantoin  Macrocrystal] Itching      Caused the patient to feel hot.        Patient Measurements: Height: 5' 4 (162.6 cm) Weight: 136.1 kg (300 lb) IBW/kg (Calculated) : 54.7 HEPARIN DW (KG): 88.7   Vital Signs: Temp: 99.6 F (37.6 C) (10/27 0814) Temp Source: Oral (10/27 0814) BP: 126/89 (10/27 0814) Pulse Rate: 103 (10/27 0814)   Labs: Recent Labs (last 2 labs)        Recent Labs    12/13/23 0415 12/13/23 1740 12/14/23 0723 12/15/23 0416  HGB 8.9*  --  8.7* 9.2*  HCT 27.7*  --  27.1* 28.3*  PLT 404*  --  394 387  CREATININE 1.00 1.00 1.05* 1.04*        Estimated Creatinine Clearance: 105 mL/min (A) (by C-G formula based on SCr of 1.04 mg/dL (H)).     Medical History:     Past Medical History:  Diagnosis Date   Anxiety     Back pain  Chlamydia     Depression med made her navel itching and  made her sleepy so she quit taking them   Diabetes mellitus without complication (HCC)     Edema, lower extremity     Elevated cholesterol     Fibrocystic breast changes of both breasts     GERD (gastroesophageal reflux disease)     History of cesarean section, classical 12/18/2012    2014    Human papilloma virus     Hypertension     Insomnia     Joint pain     Moderate dysplasia of cervix     Numbness      right arm to hand   Ovarian cyst     Prediabetes     Tubo-ovarian abscess 12/02/2023   Vitamin D  deficiency            Medications:  Heparin infusion @ 2000 units/hr    Assessment: Misty Walsh is a 33 year old female initially presenting to the hospital with sepsis 2/2 TOA. Found to have PE and bilateral DVTs this admission and started on heparin drip on 10/21, which was later transitioned to Eliquis on 10/23. Today pharmacy was consulted to switch back to heparin drip for possible drain placement with IR. SCR stable at 1.04, CrCl 105, plts 387. Baseline aPTT and heparin levels ordered for correlation  and dose titration given Eliquis therapy.   Last dose of Eliquis was given 10/26 at 2147 Previously therapeutic on heparin 2000 units/hr   Goal of Therapy:  Heparin level 0.3-0.7 units/ml aPTT goal 60-102s Monitor platelets by anticoagulation protocol: Yes   Plan:  Continue heparin infusion at 2000 units/hr Draw aPTT and heparin level with AM labs in ~6 hours. Once heparin level and aPTT correlate, can use heparin level for dose adjustments.    Misty Walsh 12/16/2023,2:27 AM

## 2023-12-16 NOTE — Progress Notes (Signed)
 Daily Gynecology Note  Admission Date: 12/01/2023 Current Date: 12/16/2023 9:32 AM  Misty Walsh is a 33 y.o. G1P0101 admitted by the [facility] service for possible urosepsis on 10/13, with 20cm pelvic abscess/likely recurrent TOA seen on imaging. Patient started on IV abx and [facility] IR didn't feel able to drain. She was then transferred to [facility] GYN on 10/17 and IR at [facility] didn't feel able to drain, as well.   She was taken to the OR by GYN and Gen Surg on 10/20 and had a ex-lap via vertical midline/LOA/removal of left adnexal cyst wall/washout/JP drain placement x 2 (left and right posterior gutters) and closed with staples. Operative findings significant for extensive adhesions, difficulty identifying GYN anatomy with large amount of sero-sang fluid during surgery; surg path of cyst wall, cultures and gram stain all negative, as well as final admit blood cultures.   Post op, patient diagnosed with bilateral LE DVTs and PE and started on anti-coagulation  History complicated by: Patient Active Problem List   Diagnosis Date Noted   Sepsis (HCC) 12/09/2023   AKI (acute kidney injury) 12/09/2023   Acute hypoxic respiratory failure (HCC) 12/09/2023   S/P exploratory laparotomy 12/09/2023   Tubo-ovarian abscess 12/02/2023   Acute pyelonephritis 12/02/2023   BMI 50.0-59.9, adult (HCC) 12/02/2023   Obesity, class 3 (HCC) 12/02/2023   Sepsis due to gram-negative UTI (HCC) 12/01/2023   Metabolic dysfunction-associated steatotic liver disease (MASLD) 12/26/2022   Class 3 severe obesity with serious comorbidity and body mass index (BMI) of 50.0 to 59.9 in adult (HCC) 11/20/2022   Physically inactive 11/20/2022   Cyst of right ovary 06/05/2022   Episodic cluster headache, not intractable 04/09/2022   Hypoventilation associated with obesity syndrome (HCC) 04/09/2022   Sleep-related headache 04/09/2022   Loud snoring 04/09/2022   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/2022    Anxiety and depression 04/09/2022   Adnexal mass 03/20/2022   Type 2 diabetes mellitus without complication, without long-term current use of insulin  (HCC) 02/08/2021   Symptomatic mammary hypertrophy 12/07/2019   Back pain 12/07/2019   Neck pain 12/07/2019   Prediabetes 11/04/2019   Hyperlipemia 11/04/2019   Vitamin D  deficiency 11/04/2019   Primary hypertension 03/22/2019   Hemorrhagic ovarian cyst 06/21/2017   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   Cellulitis 01/06/2013   History of cesarean section, classical 12/18/2012   Allergy 10/29/2012   Overnight/24hr events:  Taken off eliquis onto heparin gtt s/p IR consult   Subjective:  Pt with nausea, no vomiting and doesn't have appetite. She states she is having BMs. No fevers, chills, chest pain, sob  Objective:    Current Vital Signs 24h Vital Sign Ranges  T 99.8 F (37.7 C) Temp  Avg: 98.8 F (37.1 C)  Min: 97.5 F (36.4 C)  Max: 99.8 F (37.7 C)  BP (!) 135/92 BP  Min: 124/76  Max: 156/79  HR (!) 102 Pulse  Avg: 105  Min: 102  Max: 113  RR 16 Resp  Avg: 17.3  Min: 16  Max: 18  SaO2 95 % Room Air SpO2  Avg: 96.1 %  Min: 94 %  Max: 99 %       24 Hour I/O Current Shift I/O  Time Ins Outs 10/27 0701 - 10/28 0700 In: -  Out: 27.5 [Drains:27.5] No intake/output data recorded.   Patient Vitals for the past 24 hrs:  BP Temp Temp src Pulse Resp SpO2  12/16/23 0855 (!) 135/92 99.8  F (37.7 C) Oral (!) 102 16 95 %  12/16/23 0542 (!) 142/89 99.2 F (37.3 C) Oral (!) 102 16 96 %  12/16/23 0027 (!) 156/79 98.1 F (36.7 C) Oral (!) 103 18 97 %  12/15/23 1932 (!) 137/98 (!) 97.5 F (36.4 C) Oral (!) 113 18 97 %  12/15/23 1624 130/78 98.7 F (37.1 C) Oral (!) 104 18 99 %  12/15/23 1236 -- -- -- -- -- 94 %  12/15/23 1235 124/76 99.3 F (37.4 C) Oral (!) 106 18 95 %  JP output: 30-74mL/24h for both JPs  Physical exam: General: Well nourished, well developed female in no acute distress. Abdomen: obese, c/d/I  dressing with spots of old blood. Nttp, +BS, non distended. Scant sanginous fluid in both JPs.  Cardiovascular: S1, S2 normal, no murmur, rub or gallop, regular rate and rhythm Respiratory: CTAB Skin: Warm and dry.  Ext: 3+ edema b/l in LEs  Medications: Current Facility-Administered Medications  Medication Dose Route Frequency Provider Last Rate Last Admin   acetaminophen  (TYLENOL ) tablet 650 mg  650 mg Oral Q6H Ozan, Jennifer, DO   650 mg at 12/16/23 0016   alum & mag hydroxide-simeth (MAALOX/MYLANTA) 200-200-20 MG/5ML suspension 30 mL  30 mL Oral Q4H PRN Jadine Toribio SQUIBB, MD   30 mL at 12/03/23 0321   amLODipine (NORVASC) tablet 5 mg  5 mg Oral Daily Al-Sultani, Anmar, MD   5 mg at 12/15/23 1043   atorvastatin (LIPITOR) tablet 80 mg  80 mg Oral Daily Lue Elsie BROCKS, MD   80 mg at 12/15/23 1042   chlorthalidone  (HYGROTON ) tablet 25 mg  25 mg Oral Daily Lue Elsie BROCKS, MD   25 mg at 12/15/23 1041   diphenhydrAMINE  (BENADRYL ) injection 50 mg  50 mg Intravenous Q8H PRN Ozan, Jennifer, DO   50 mg at 12/09/23 1652   doxycycline  (VIBRA -TABS) tablet 100 mg  100 mg Oral Q12H Zina Jerilynn LABOR, MD   100 mg at 12/16/23 0016   gabapentin (NEURONTIN) capsule 300 mg  300 mg Oral TID Ozan, Jennifer, DO   300 mg at 12/16/23 0016   glycopyrrolate  (ROBINUL ) injection 0.1 mg  0.1 mg Intravenous TID Al-Sultani, Anmar, MD   0.1 mg at 12/15/23 2228   heparin ADULT infusion 100 units/mL (25000 units/250mL)  2,000 Units/hr Intravenous Continuous Izell Harari, MD 20 mL/hr at 12/15/23 2338 2,000 Units/hr at 12/15/23 2338   hydrALAZINE (APRESOLINE) injection 5 mg  5 mg Intravenous Q6H PRN Jadine Toribio SQUIBB, MD   5 mg at 12/04/23 9090   insulin  aspart (novoLOG) injection 0-15 Units  0-15 Units Subcutaneous TID WC Al-Sultani, Anmar, MD       insulin  aspart (novoLOG) injection 0-5 Units  0-5 Units Subcutaneous QHS Al-Sultani, Anmar, MD       insulin  glargine-yfgn (SEMGLEE) injection 6 Units  6 Units  Subcutaneous Daily Arlon Carliss ORN, DO   6 Units at 12/15/23 1037   loperamide (IMODIUM) capsule 2 mg  2 mg Oral PRN Arlon Carliss ORN, DO   2 mg at 12/08/23 0951   losartan (COZAAR) tablet 50 mg  50 mg Oral Daily Al-Sultani, Anmar, MD   50 mg at 12/15/23 1042   menthol  (CEPACOL) lozenge 3 mg  1 lozenge Oral PRN Ozan, Jennifer, DO   3 mg at 12/10/23 2053   metoCLOPramide  (REGLAN ) tablet 5 mg  5 mg Oral Q8H Zina Jerilynn LABOR, MD   5 mg at 12/15/23 2229   metroNIDAZOLE  (FLAGYL ) tablet 500 mg  500  mg Oral Q12H Zina Jerilynn LABOR, MD   500 mg at 12/16/23 0016   ondansetron  (ZOFRAN ) tablet 4 mg  4 mg Oral Q6H PRN Al-Sultani, Anmar, MD   4 mg at 12/14/23 0827   Oral care mouth rinse  15 mL Mouth Rinse PRN Jadine Toribio SQUIBB, MD       oxyCODONE  (Oxy IR/ROXICODONE ) immediate release tablet 5-10 mg  5-10 mg Oral Q4H PRN Lue Elsie BROCKS, MD   10 mg at 12/16/23 0016   pantoprazole  (PROTONIX ) EC tablet 40 mg  40 mg Oral Daily Lue Elsie BROCKS, MD   40 mg at 12/15/23 1041   polyethylene glycol (MIRALAX  / GLYCOLAX ) packet 17 g  17 g Oral QODAY Mizani Dilday, MD       promethazine  (PHENERGAN ) tablet 25 mg  25 mg Oral Q6H PRN Zina Jerilynn LABOR, MD   25 mg at 12/16/23 9093   scopolamine  (TRANSDERM-SCOP) 1 MG/3DAYS 1 mg  1 patch Transdermal Q72H Zina Jerilynn LABOR, MD   1 mg at 12/15/23 1749   senna-docusate (Senokot-S) tablet 1 tablet  1 tablet Oral QHS PRN Izell Harari, MD       Labs:  CBGs 140s-150s Recent Labs  Lab 12/14/23 0723 12/15/23 0416 12/16/23 0849  WBC 15.7* 13.9* 14.0*  HGB 8.7* 9.2* 9.2*  HCT 27.1* 28.3* 27.8*  PLT 394 387 415*   Recent Labs  Lab 12/11/23 0057 12/12/23 0437 12/13/23 0415 12/13/23 1740 12/14/23 0723 12/15/23 0416  NA 136 133* 134* 135 134* 133*  K 3.4* 3.0* 3.3* 3.7 3.4* 3.5  CL 96* 96* 97* 99 96* 94*  CO2 26 25 24 25 24 26   BUN 12 9 7 8 10 9   CREATININE 0.99 1.04* 1.00 1.00 1.05* 1.04*  CALCIUM  8.4* 8.6* 8.7* 9.0 8.9 8.9  PROT 6.7 6.4* 6.3*  --   --    --   BILITOT 0.5 0.5 0.5  --   --   --   ALKPHOS 49 46 48  --   --   --   ALT 25 23 24   --   --   --   AST 47* 42* 41  --   --   --   GLUCOSE 154* 165* 162* 168* 178* 142*   Radiology: no new imaging  Assessment & Plan:  Patient stable *GYN: see below. close following for potential ileus. Remove staples pod# 10-14d. Hospitalist team following along, given numerous co-morbidities.  *ID: seen by IR on 10/27 and planning for drainage attempt on 10/29. continue PO doxy (D#4) & PO flagyl  (D#4).   -s/p rocephin  10/13-10/22; cefoxitin x 1 on 10/20; doxy IV 10/13-10/25; flagyl  IV 10/14-10/24; zosyn x 1 on 10/13 *Heme: transitioned from bid eliquis to heparin gtt on 10/27, in anticipation of IR procedure on 10/29.  Follow up to see if an outpatient Heme referral has been made.  -d/w vascular surgery and medication management only recommended *Pulm: see above. No current issues.  *CV: continue current regimen *Endo: continue current regimen. Will hold meal coverage for this morning based on what patient feels up to eating.  *Renal: Cr/AKI stable *BMI 50s: PT follow and recommended rolling walker and should be fine for going home at discharge. Follow up any new recommendations.  *FEN/GI: regular diet. Saline lock IV. Pepcid . Miralax  *Dispo: Unsure of dispo. Patient states she lives in a first floor apartment with minimal stairs and will be able to have someone stay with her whenever she is discharged.   Harari Izell,  Mickey MD Attending Center for [facility] (Faculty Practice) GYN Consult Phone: [phone] (M-F, [phone]) & [phone]  (Off hours, weekends, holidays)

## 2023-12-17 ENCOUNTER — Inpatient Hospital Stay (HOSPITAL_COMMUNITY): Payer: MEDICAID

## 2023-12-17 ENCOUNTER — Other Ambulatory Visit (HOSPITAL_COMMUNITY): Payer: Self-pay

## 2023-12-17 LAB — CBC
HCT: 28 % — ABNORMAL LOW (ref 36.0–46.0)
Hemoglobin: 9 g/dL — ABNORMAL LOW (ref 12.0–15.0)
MCH: 30.6 pg (ref 26.0–34.0)
MCHC: 32.1 g/dL (ref 30.0–36.0)
MCV: 95.2 fL (ref 80.0–100.0)
Platelets: 395 K/uL (ref 150–400)
RBC: 2.94 MIL/uL — ABNORMAL LOW (ref 3.87–5.11)
RDW: 13.6 % (ref 11.5–15.5)
WBC: 15 K/uL — ABNORMAL HIGH (ref 4.0–10.5)
nRBC: 0 % (ref 0.0–0.2)

## 2023-12-17 LAB — BASIC METABOLIC PANEL WITH GFR
Anion gap: 13 (ref 5–15)
BUN: 9 mg/dL (ref 6–20)
CO2: 22 mmol/L (ref 22–32)
Calcium: 8.6 mg/dL — ABNORMAL LOW (ref 8.9–10.3)
Chloride: 99 mmol/L (ref 98–111)
Creatinine, Ser: 1.05 mg/dL — ABNORMAL HIGH (ref 0.44–1.00)
GFR, Estimated: >60 mL/min (ref >60)
Glucose, Bld: 185 mg/dL — ABNORMAL HIGH (ref 70–99)
Potassium: 3.9 mmol/L (ref 3.5–5.1)
Sodium: 134 mmol/L — ABNORMAL LOW (ref 135–145)

## 2023-12-17 LAB — PROTIME-INR
INR: 1.2 (ref 0.8–1.2)
Prothrombin Time: 16.3 s — ABNORMAL HIGH (ref 11.4–15.2)

## 2023-12-17 LAB — APTT
aPTT: 60 s — ABNORMAL HIGH (ref 24–36)
aPTT: 79 s — ABNORMAL HIGH (ref 24–36)

## 2023-12-17 LAB — GLUCOSE, CAPILLARY
Glucose-Capillary: 142 mg/dL — ABNORMAL HIGH (ref 70–99)
Glucose-Capillary: 251 mg/dL — ABNORMAL HIGH (ref 70–99)
Glucose-Capillary: 180 mg/dL — ABNORMAL HIGH (ref 70–99)
Glucose-Capillary: 127 mg/dL — ABNORMAL HIGH (ref 70–99)

## 2023-12-17 LAB — HEPARIN LEVEL (UNFRACTIONATED)
Heparin Unfractionated: 0.63 [IU]/mL (ref 0.30–0.70)
Heparin Unfractionated: 0.38 [IU]/mL (ref 0.30–0.70)

## 2023-12-17 LAB — MAGNESIUM: Magnesium: 1.8 mg/dL (ref 1.7–2.4)

## 2023-12-17 MED ORDER — METOCLOPRAMIDE HCL 5 MG/ML IJ SOLN
10.0000 mg | Freq: Three times a day (TID) | INTRAMUSCULAR | Status: DC | PRN
  Administered 2023-12-17 – 2023-12-20 (×3): 10 mg via INTRAVENOUS
  Filled 2023-12-17 (×3): qty 2

## 2023-12-17 MED ORDER — HEPARIN (PORCINE) 25000 UT/250ML-% IV SOLN
2000.0000 [IU]/h | INTRAVENOUS | Status: DC
  Administered 2023-12-17 – 2023-12-18 (×2): 2000 [IU]/h via INTRAVENOUS
  Filled 2023-12-17: qty 250

## 2023-12-17 MED ORDER — MIDAZOLAM HCL (PF) 2 MG/2ML IJ SOLN
INTRAMUSCULAR | Status: AC | PRN
  Administered 2023-12-17 (×2): 1 mg via INTRAVENOUS

## 2023-12-17 MED ORDER — PANTOPRAZOLE SODIUM 40 MG IV SOLR
40.0000 mg | INTRAVENOUS | Status: DC
  Administered 2023-12-17 – 2023-12-19 (×3): 40 mg via INTRAVENOUS
  Filled 2023-12-17 (×3): qty 10

## 2023-12-17 MED ORDER — HYDRALAZINE HCL 20 MG/ML IJ SOLN
10.0000 mg | Freq: Three times a day (TID) | INTRAMUSCULAR | Status: DC
  Administered 2023-12-17 – 2023-12-20 (×9): 10 mg via INTRAVENOUS
  Filled 2023-12-17 (×9): qty 1

## 2023-12-17 MED ORDER — FENTANYL CITRATE (PF) 100 MCG/2ML IJ SOLN
INTRAMUSCULAR | Status: AC | PRN
  Administered 2023-12-17 (×3): 50 ug via INTRAVENOUS

## 2023-12-17 MED ORDER — MIDAZOLAM HCL 2 MG/2ML IJ SOLN
INTRAMUSCULAR | Status: AC
  Filled 2023-12-17: qty 4

## 2023-12-17 MED ORDER — FENTANYL CITRATE (PF) 100 MCG/2ML IJ SOLN
INTRAMUSCULAR | Status: AC
  Filled 2023-12-17: qty 4

## 2023-12-17 MED ORDER — SODIUM CHLORIDE 0.9% FLUSH
5.0000 mL | Freq: Three times a day (TID) | INTRAVENOUS | Status: DC
  Administered 2023-12-17 – 2023-12-19 (×7): 5 mL

## 2023-12-17 NOTE — Progress Notes (Signed)
 Daily Gynecology Note  Admission Date: 12/01/2023 Current Date: 12/17/2023 10:54 AM  Misty Walsh is a 33 y.o. 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 Patient started on IV abx and [facility] IR didn't feel able to drain. She was then transferred to [facility] GYN on 10/17 and IR at [facility] didn't feel able to drain, as well.   She was taken to the OR by GYN and Gen Surg on 10/20 and had a ex-lap via vertical midline/LOA/removal of left adnexal cyst wall/washout/JP drain placement x 2 (left and right posterior gutters) and closed with staples. Operative findings significant for extensive adhesions, difficulty identifying GYN anatomy with large amount of sero-sang fluid during surgery; surg path of cyst wall, cultures and gram stain all negative, as well as final admit blood cultures.   Post op, patient diagnosed with bilateral LE DVTs and PE and started on anti-coagulation  History complicated by: Patient Active Problem List   Diagnosis Date Noted   Sepsis (HCC) 12/09/2023   AKI (acute kidney injury) 12/09/2023   Acute hypoxic respiratory failure (HCC) 12/09/2023   S/P exploratory laparotomy 12/09/2023   Tubo-ovarian abscess 12/02/2023   Acute pyelonephritis 12/02/2023   BMI 50.0-59.9, adult (HCC) 12/02/2023   Obesity, class 3 (HCC) 12/02/2023   Sepsis due to gram-negative UTI (HCC) 12/01/2023   Metabolic dysfunction-associated steatotic liver disease (MASLD) 12/26/2022   Class 3 severe obesity with serious comorbidity and body mass index (BMI) of 50.0 to 59.9 in adult (HCC) 11/20/2022   Physically inactive 11/20/2022   Cyst of right ovary 06/05/2022   Episodic cluster headache, not intractable 04/09/2022   Hypoventilation associated with obesity syndrome (HCC) 04/09/2022   Sleep-related headache 04/09/2022   Loud snoring 04/09/2022   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/2022    Anxiety and depression 04/09/2022   Adnexal mass 03/20/2022   Type 2 diabetes mellitus without complication, without long-term current use of insulin  (HCC) 02/08/2021   Symptomatic mammary hypertrophy 12/07/2019   Back pain 12/07/2019   Neck pain 12/07/2019   Prediabetes 11/04/2019   Hyperlipemia 11/04/2019   Vitamin D  deficiency 11/04/2019   Primary hypertension 03/22/2019   Hemorrhagic ovarian cyst 06/21/2017   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   Cellulitis 01/06/2013   History of cesarean section, classical 12/18/2012   Allergy 10/29/2012   Overnight/24hr events:  Just got back from IR s/p drainage and transgluteal drain placement Normal abdominal 2v formal x-ray (non specific gas pattern, no dilated loops) yesterday during the day Dressing changed overnight due to some clear fluid noted around it  Subjective:  Pt continues with nausea and vomiting and not really taking PO. Last BM yesterday. No fevers, chills, chest pain, sob  Objective:    Current Vital Signs 24h Vital Sign Ranges  T 99.4 F (37.4 C) Temp  Avg: 98.7 F (37.1 C)  Min: 98.1 F (36.7 C)  Max: 99.6 F (37.6 C)  BP (!) 148/73 BP  Min: 128/91  Max: 155/94  HR 93 Pulse  Avg: 101.5  Min: 93  Max: 109  RR 20 Resp  Avg: 19  Min: 16  Max: 20  SaO2 99 % Room Air SpO2  Avg: 96.8 %  Min: 93 %  Max: 99 %       24 Hour I/O Current Shift I/O  Time Ins Outs 10/28 0701 - 10/29 0700 In: 421.4 [I.V.:421.4] Out: 307.5 [Urine:250; Drains:27.5]  10/29 0701 - 10/29 1900 In: -  Out: 50 [Drains:50]   Patient Vitals for the past 24 hrs:  BP Temp Temp src Pulse Resp SpO2  12/17/23 1011 (!) 148/73 -- -- 93 -- 99 %  12/17/23 1000 (!) 155/94 -- -- (!) 104 20 96 %  12/17/23 0945 (!) 143/93 -- -- (!) 102 20 97 %  12/17/23 0940 (!) 131/93 -- -- (!) 102 20 98 %  12/17/23 0935 (!) 138/93 -- -- (!) 103 20 97 %  12/17/23 0904 (!) 146/97 -- -- (!) 101 19 99 %  12/17/23 0845 (!) 143/80 -- -- 100 19 99 %  12/17/23  0746 (!) 140/80 99.4 F (37.4 C) Oral (!) 104 18 95 %  12/17/23 0304 134/78 98.1 F (36.7 C) Oral (!) 109 20 96 %  12/16/23 2310 (!) 134/90 98.8 F (37.1 C) Oral 99 20 93 %  12/16/23 1955 (!) 140/93 99.6 F (37.6 C) Oral (!) 105 18 98 %  12/16/23 1627 (!) 128/91 98.1 F (36.7 C) Oral 97 18 98 %  12/16/23 1314 138/83 98.1 F (36.7 C) Oral 100 16 94 %   Transgluteal drain with approximately 20-83mL sero-sang fluid in it, bulb on suction. Insertion site looks normal  Surgical JP drain 1 and 2 with approximately 30mL output overnight 24h; serosang fluid in them  Physical exam: General: Well nourished, well developed female in no acute distress. Abdomen: obese, c/d/I dressing with spots of old blood. Nttp, +BS, non distended.  Cardiovascular: S1, S2 normal, no murmur, rub or gallop, regular rate and rhythm Respiratory: CTAB Skin: Warm and dry.  Ext: 3+ edema b/l in LEs  Medications: Current Facility-Administered Medications  Medication Dose Route Frequency Provider Last Rate Last Admin   acetaminophen  (TYLENOL ) tablet 650 mg  650 mg Oral Q6H Ozan, Jennifer, DO   650 mg at 12/16/23 0016   alum & mag hydroxide-simeth (MAALOX/MYLANTA) 200-200-20 MG/5ML suspension 30 mL  30 mL Oral Q4H PRN Jadine Toribio SQUIBB, MD   30 mL at 12/03/23 0321   amLODipine (NORVASC) tablet 5 mg  5 mg Oral Daily Al-Sultani, Anmar, MD   5 mg at 12/15/23 1043   atorvastatin (LIPITOR) tablet 80 mg  80 mg Oral Daily Lue Elsie BROCKS, MD   80 mg at 12/15/23 1042   chlorthalidone  (HYGROTON ) tablet 25 mg  25 mg Oral Daily Lue Elsie BROCKS, MD   25 mg at 12/15/23 1041   diphenhydrAMINE  (BENADRYL ) injection 50 mg  50 mg Intravenous Q8H PRN Ozan, Jennifer, DO   50 mg at 12/09/23 1652   doxycycline  (VIBRAMYCIN ) 100 mg in sodium chloride  0.9 % 250 mL IVPB  100 mg Intravenous Q12H Al-Sultani, Anmar, MD 125 mL/hr at 12/17/23 0425 100 mg at 12/17/23 0425   gabapentin (NEURONTIN) capsule 300 mg  300 mg Oral TID Ozan,  Jennifer, DO   300 mg at 12/16/23 0016   glycopyrrolate  (ROBINUL ) injection 0.1 mg  0.1 mg Intravenous TID Al-Sultani, Anmar, MD   0.1 mg at 12/16/23 2200   heparin ADULT infusion 100 units/mL (25000 units/250mL)  2,000 Units/hr Intravenous Continuous Izell Harari, MD   Held at 12/17/23 0500   hydrALAZINE (APRESOLINE) injection 5 mg  5 mg Intravenous Q6H PRN Jadine Toribio SQUIBB, MD   5 mg at 12/04/23 9090   HYDROmorphone  (DILAUDID ) injection 1 mg  1 mg Intravenous Q3H PRN Izell Harari, MD   1 mg at 12/16/23 2216   insulin  aspart (novoLOG) injection 0-15 Units  0-15 Units Subcutaneous  TID WC Al-Sultani, Anmar, MD       insulin  aspart (novoLOG) injection 0-5 Units  0-5 Units Subcutaneous QHS Al-Sultani, Anmar, MD       insulin  glargine-yfgn (SEMGLEE) injection 6 Units  6 Units Subcutaneous Daily Arlon Carliss ORN, DO   6 Units at 12/16/23 1326   loperamide (IMODIUM) capsule 2 mg  2 mg Oral PRN Arlon Carliss ORN, DO   2 mg at 12/08/23 0951   losartan (COZAAR) tablet 50 mg  50 mg Oral Daily Al-Sultani, Anmar, MD   50 mg at 12/15/23 1042   menthol  (CEPACOL) lozenge 3 mg  1 lozenge Oral PRN Ozan, Jennifer, DO   3 mg at 12/10/23 2053   metoCLOPramide  (REGLAN ) injection 10 mg  10 mg Intravenous Q8H PRN Izell Harari, MD       metoCLOPramide  (REGLAN ) tablet 5 mg  5 mg Oral Q8H Zina Jerilynn LABOR, MD   5 mg at 12/15/23 2229   metroNIDAZOLE  (FLAGYL ) IVPB 500 mg  500 mg Intravenous Q12H Al-Sultani, Anmar, MD 100 mL/hr at 12/17/23 0302 500 mg at 12/17/23 0302   ondansetron  (ZOFRAN ) injection 4 mg  4 mg Intravenous Q6H Al-Sultani, Anmar, MD   4 mg at 12/17/23 9357   ondansetron  (ZOFRAN ) tablet 4 mg  4 mg Oral Q6H PRN Al-Sultani, Anmar, MD   4 mg at 12/14/23 0827   Oral care mouth rinse  15 mL Mouth Rinse PRN Jadine Toribio SQUIBB, MD       oxyCODONE  (Oxy IR/ROXICODONE ) immediate release tablet 5-10 mg  5-10 mg Oral Q4H PRN Lue Elsie BROCKS, MD   10 mg at 12/16/23 0016   pantoprazole  (PROTONIX ) EC tablet  40 mg  40 mg Oral Daily Lue Elsie BROCKS, MD   40 mg at 12/15/23 1041   polyethylene glycol (MIRALAX  / GLYCOLAX ) packet 17 g  17 g Oral QODAY Jovani Flury, MD       prochlorperazine  (COMPAZINE ) injection 10 mg  10 mg Intravenous Q6H PRN Al-Sultani, Anmar, MD   10 mg at 12/17/23 0346   promethazine  (PHENERGAN ) tablet 25 mg  25 mg Oral Q6H PRN Zina Jerilynn LABOR, MD   25 mg at 12/16/23 9093   scopolamine  (TRANSDERM-SCOP) 1 MG/3DAYS 1 mg  1 patch Transdermal Q72H Zina Jerilynn LABOR, MD   1 mg at 12/15/23 1749   senna-docusate (Senokot-S) tablet 1 tablet  1 tablet Oral QHS PRN Izell Harari, MD       sodium chloride  flush (NS) 0.9 % injection 5 mL  5 mL Intracatheter Q8H Vanice Sharper, MD       Labs:  CBGs 140s Recent Labs  Lab 12/15/23 0416 12/16/23 0849 12/17/23 0538  WBC 13.9* 14.0* 15.0*  HGB 9.2* 9.2* 9.0*  HCT 28.3* 27.8* 28.0*  PLT 387 415* 395   Recent Labs  Lab 12/11/23 0057 12/12/23 0437 12/13/23 0415 12/13/23 1740 12/15/23 0416 12/16/23 0849 12/17/23 0538  NA 136 133* 134*   < > 133* 134* 134*  K 3.4* 3.0* 3.3*   < > 3.5 3.4* 3.9  CL 96* 96* 97*   < > 94* 97* 99  CO2 26 25 24    < > 26 25 22   BUN 12 9 7    < > 9 9 9   CREATININE 0.99 1.04* 1.00   < > 1.04* 1.01* 1.05*  CALCIUM  8.4* 8.6* 8.7*   < > 8.9 8.4* 8.6*  PROT 6.7 6.4* 6.3*  --   --   --   --   BILITOT  0.5 0.5 0.5  --   --   --   --   ALKPHOS 49 46 48  --   --   --   --   ALT 25 23 24   --   --   --   --   AST 47* 42* 41  --   --   --   --   GLUCOSE 154* 165* 162*   < > 142* 154* 185*   < > = values in this interval not displayed.   Radiology: no new imaging  Assessment & Plan:  Patient stable *GYN: see below. Unsure etiology for n/v. Continue to follow closely.  Remove staples pod# 10-14d. Hospitalist team following along, given numerous co-morbidities.  *ID:  approximately 60mL pus-like material removed in IR. Followed up cultures, studies and IR and surgical drain output. continue doxy (D#5) &  flagyl  (D#5).   -s/p rocephin  10/13-10/22; cefoxitin x 1 on 10/20; doxy IV 10/13-10/25; flagyl  IV 10/14-10/24; zosyn x 1 on 10/13 *Heme: transitioned from bid eliquis to heparin gtt on 10/27, in anticipation of IR procedure on 10/29. Per IR, restart heparin gtt at 1500 and NO bolus.  Follow up to see if an outpatient Heme referral has been made.  -d/w vascular surgery and medication management only recommended *Pulm: see above. No current issues.  *CV: continue current regimen *Endo: continue current regimen. Will hold meal coverage for this morning based on what patient feels up to eating.  *Renal: Cr/AKI stable *BMI 50s: PT follow and recommended rolling walker and should be fine for going home at discharge. Follow up any new recommendations.  *FEN/GI: clears. Saline lock IV. Pepcid . Miralax  *Dispo: Unsure of dispo. Patient states she lives in a first floor apartment with minimal stairs and will be able to have someone stay with her whenever she is discharged.   Bebe Izell Raddle MD Attending Center for [facility] (Faculty Practice) GYN Consult Phone: [phone] (M-F, 0800-1700) & [phone]  (Off hours, weekends, holidays)

## 2023-12-17 NOTE — Progress Notes (Signed)
 PT Cancellation Note  Patient Details Name: Misty Walsh MRN: [id number] DOB: Jun 23, 1990   Cancelled Treatment:    Reason Eval/Treat Not Completed: Patient at procedure or test/unavailable   Rodgers ORN [facility] 12/17/2023, 10:09 AM Rodgers Opal PT Acute Rehabilitation Services Office [phone]

## 2023-12-17 NOTE — Progress Notes (Addendum)
  PHARMACY - ANTICOAGULATION CONSULT NOTE  Pharmacy Consult for heparin infusion Indication: pulmonary embolus  Allergies  Allergen Reactions   Macrobid  [Nitrofurantoin  Macrocrystal] Itching    Caused the patient to feel hot.    Patient Measurements: Height: 5' 4 (162.6 cm) Weight: 136.1 kg (300 lb) IBW/kg (Calculated) : 54.7 HEPARIN DW (KG): 88.7  Vital Signs: Temp: 98.6 F (37 C) (10/29 1011) Temp Source: Oral (10/29 1011) BP: 140/56 (10/29 1112) Pulse Rate: 96 (10/29 1112)  Labs: Recent Labs    12/15/23 0416 12/15/23 1428 12/15/23 1814 12/16/23 0141 12/16/23 0849 12/17/23 0538  HGB 9.2*  --   --   --  9.2* 9.0*  HCT 28.3*  --   --   --  27.8* 28.0*  PLT 387  --   --   --  415* 395  APTT  --    < > 75* 84* 85* 60*  LABPROT  --   --   --   --  17.2* 16.3*  INR  --   --   --   --  1.3* 1.2  HEPARINUNFRC  --    < > >1.10*  --  1.00* 0.63  CREATININE 1.04*  --   --   --  1.01* 1.05*   < > = values in this interval not displayed.    Estimated Creatinine Clearance: 104 mL/min (A) (by C-G formula based on SCr of 1.05 mg/dL (H)).   Medical History: Past Medical History:  Diagnosis Date   Anxiety    Back pain    Chlamydia    Depression med made her navel itching and  made her sleepy so she quit taking them   Diabetes mellitus without complication (HCC)    Edema, lower extremity    Elevated cholesterol    Fibrocystic breast changes of both breasts    GERD (gastroesophageal reflux disease)    History of cesarean section, classical 12/18/2012   2014    Human papilloma virus    Hypertension    Insomnia    Joint pain    Moderate dysplasia of cervix    Numbness    right arm to hand   Ovarian cyst    Prediabetes    Tubo-ovarian abscess 12/02/2023   Vitamin D  deficiency     Medications:  Infusions:   doxycycline  (VIBRAMYCIN ) IV 100 mg (12/17/23 0425)   heparin 2,000 Units/hr (12/17/23 1456)   metronidazole  500 mg (12/17/23 0302)    Assessment: Ms.  [name] is a 33 year old female initially presenting to the hospital with sepsis 2/2 TOA. Found to have PE and bilateral DVTs this admission and started on heparin drip on 10/21, which was later transitioned to Eliquis on 10/23. Pharmacy was consulted to switch back to heparin drip for drain placement with IR drain placement on 10/29.    Goal of Therapy:  Heparin level 0.3-0.7 units/ml aPTT 66-102s Monitor platelets by anticoagulation protocol: Yes   Plan:  Restarted heparin drip at 1500 4 hours after return from IR, will repeat labs at 2100 for Heparin lvl and aPTT. Discussed transitioning back to DOAC tomorrow, pending clinical improvement.   Misty Walsh 12/17/2023,3:02 PM  Heparin drip currently at 2000 units/hr.  2117 Heparin level= 0.37 and aPTT = 79sec Will continue current rate and check levels in am with labs. Will adjusted accordingly until transtion to Eliquis can be made.

## 2023-12-17 NOTE — Plan of Care (Signed)

## 2023-12-17 NOTE — Progress Notes (Signed)
 PT Cancellation Note  Patient Details Name: Misty Walsh MRN: [id number] DOB: 07-13-1990   Cancelled Treatment:    Reason Eval/Treat Not Completed: Other (comment). Pt back from IR but per nursing did not feel up to working with PT today. Will try again tomorrow.   Rodgers ORN [facility] 12/17/2023, 1:37 PM Rodgers Opal PT Acute [employer] [phone]

## 2023-12-17 NOTE — Progress Notes (Signed)
 PHARMACY - ANTICOAGULATION CONSULT NOTE  Pharmacy Consult for heparin infusion Indication: pulmonary embolus and DVT  Allergies  Allergen Reactions   Macrobid  [Nitrofurantoin  Macrocrystal] Itching    Caused the patient to feel hot.    Patient Measurements: Height: 5' 4 (162.6 cm) Weight: 136.1 kg (300 lb) IBW/kg (Calculated) : 54.7 HEPARIN DW (KG): 88.7  Vital Signs: Temp: 98.1 F (36.7 C) (10/29 0304) Temp Source: Oral (10/29 0304) BP: 134/78 (10/29 0304) Pulse Rate: 109 (10/29 0304)  Labs: Recent Labs    12/15/23 0416 12/15/23 1428 12/15/23 1814 12/16/23 0141 12/16/23 0849 12/17/23 0538  HGB 9.2*  --   --   --  9.2* 9.0*  HCT 28.3*  --   --   --  27.8* 28.0*  PLT 387  --   --   --  415* 395  APTT  --    < > 75* 84* 85* 60*  LABPROT  --   --   --   --  17.2* 16.3*  INR  --   --   --   --  1.3* 1.2  HEPARINUNFRC  --    < > >1.10*  --  1.00* 0.63  CREATININE 1.04*  --   --   --  1.01* 1.05*   < > = values in this interval not displayed.    Estimated Creatinine Clearance: 104 mL/min (A) (by C-G formula based on SCr of 1.05 mg/dL (H)).   Medical History: Past Medical History:  Diagnosis Date   Anxiety    Back pain    Chlamydia    Depression med made her navel itching and  made her sleepy so she quit taking them   Diabetes mellitus without complication (HCC)    Edema, lower extremity    Elevated cholesterol    Fibrocystic breast changes of both breasts    GERD (gastroesophageal reflux disease)    History of cesarean section, classical 12/18/2012   2014    Human papilloma virus    Hypertension    Insomnia    Joint pain    Moderate dysplasia of cervix    Numbness    right arm to hand   Ovarian cyst    Prediabetes    Tubo-ovarian abscess 12/02/2023   Vitamin D  deficiency     Medications:  Infusions:   doxycycline  (VIBRAMYCIN ) IV 100 mg (12/17/23 0425)   heparin Stopped (12/17/23 0500)   metronidazole  500 mg (12/17/23 0302)    Assessment: Ms.  Kuehnle is a 33 year old female initially presenting to the hospital with sepsis 2/2 TOA. Found to have PE and bilateral DVTs this admission and started on heparin drip on 10/21, which was later transitioned to Eliquis on 10/23. Pharmacy was consulted to switch back to heparin drip for drain placement with IR planned for 10/29.   Labs today: SCr  1.05, Plt 395 PTT 60 Heparin Level 0.63   Goal of Therapy:  Heparin level 0.3-0.7 units/ml PTT 60-102s Monitor platelets by anticoagulation protocol: Yes   Plan:  Continue current rate of heparin with plan to hold at 0700 per radiology orders for IR procedure today.   Prachi Oftedahl Scarlett 12/17/2023,7:15 AM

## 2023-12-17 NOTE — Progress Notes (Signed)
 PROGRESS NOTE    Misty Walsh  FMW:[id number] DOB: Feb 26, 1990 DOA: 12/01/2023 PCP: Buck Search, PA-C     Brief Narrative:  Misty Walsh is a 33 year old woman PMH including diabetes, presented with abdominal pain nausea and dry heaving, presented with sepsis and huge suspected tubo-ovarian abscess.  Transferred from [facility] to [facility]. GYN was consulted. She underwent ex lap and drainage of tubo-ovarian abscess/pelvic abscess by Dr. Vanderbilt and Dr. Ozan 10/20. Post-operatively, she was found to have PE and DVT and was started on anticoagulation. Repeat CT showed new pelvic abscesses and IR was consulted for drainage.   New events last 24 hours / Subjective: Patient seen post-procedure today. Had nausea and vomiting small amounts of bile, unable to take PO. Post-procedure, states she is feeling a bit better and going to trial jello and broth later today.   Assessment & Plan:  Principal Problem:   Sepsis (HCC) Active Problems:   Type 2 diabetes mellitus without complication, without long-term current use of insulin  (HCC)   Tubo-ovarian abscess   BMI 50.0-59.9, adult (HCC)   Obesity, class 3 (HCC)   AKI (acute kidney injury)   Acute hypoxic respiratory failure (HCC)   S/P exploratory laparotomy   #Acute PE #Acute bilateral DVTs - Noted to be tachypneic with worsening dyspnea.  Found to have acute PE. - CTA chest (12/10/2023) showed PE over the proximal right upper/middle lobe arteries and subsegmental left lower lobar artery with no evidence of right heart strain. - Etiology unclear, likely multifactorial given significant immobility, obesity, acute illness - Bilateral LE duplex showed acute bilateral DVTs with proximal involvement on the right - Vascular surgery consulted due to proximal right DVT - spoke to Dr. Magda and Dr. Pearline who indicated no surgical intervention at this time, only anticoagulation - Heparin drip discontinued on 10/23 - Transitioned on 10/23 to  treatment dose Eliquis 10 mg BID for 7 days then 5 mg BID. - Back on heparin drip due IR drainage procedure on 10/29 - Can transition back to Eliquis tomorrow 10/30 - Recommend at least 3 months of anticoagulation for likely provoked DVT given significantly reduced mobility. May benefit from referral to outpatient hematology for further delineation of appropriate length of anticoagulation   #Acute hypoxic respiratory failure - resolved - Noted to be tachypneic with worsening dyspnea and was ultimately found to have acute PE, possibly complicated further by OHS - On room air   #Sepsis secondary to large tubo-ovarian abscess - Presented with fever, leukocytosis, noted to have large tubo-ovarian abscess - Blood cultures (10/13) negative - Afebrile, leukocytosis improving - Completed 5 days of rocephin  - On doxycycline  and Flagyl  for TOA as below    # Large tubo-ovarian abscess status post ex lap w/ washout 10/20 - Surgical pathology showed benign cyst wall with cyst lining dilatation.  Negative for malignancy - Completed 5 days of Rocephin  - On doxycycline  and Flagyl   - Encouraged OOB - Incentive spirometry - CTA/P with large pelvic cul-de-sac abscess (approximately 11 x 5 x 4.8 cm) with surrounding formation and small adjacent satellite collections: Additional RLQ collection (4 cm) near the abdominal wall possibly abscess versus postop fluid.  Mild bilateral hydroureteronephrosis likely from pelvic inflammation, bladder markedly distended with some air (postcatheterization versus emphysematous cystitis).  Postop anterior abdominal wall changes with small superficial air-fluid collections (seroma/hematoma versus superficial abscess).  Sigmoid wall thickening likely reactive. - Further management per OBGYN - IR placed drain 10/29    # Refractory nausea and vomiting #  Refractory excessive oral secretions - Reports persistent nausea and vomiting as well as excessive oral secretions, not  responsive to current therapeutics - The switch from PO to IV Robinul  0.1 mg TID helped reduce secretions today. Continue scopolamine  patch - Switched PO to IV Zofran  q8h scheduled, scheduled PO Reglan  5 mg q8h, PRN PO phenergan  25 mg q6h - Continue Protonix  40 mg daily, Pepcid  20 mg BID   # Constipation - Bowel regimen    # AKI - resolved - Etiology likely multifactorial - initially decreased PO intake and sepsis, now post IV contrast exposure and Lasix  - Baseline Cr ~ 0.9.  - Stable    #Hypertension - Continue chlorthalidone  25 mg daily, Losartan to 50 mg daily, and amlodipine 5 mg daily - Unable to tolerate PO meds. Added IV hydralazine   # Bilateral lower extremity swelling, R > L - Patient reports her legs are more swollen than normal, initially thought due to fluid overload and was receiving Lasix, without much improvement - However, now found to have acute bilateral DVTs, likely the main driver of her BLE swelling - Will continue to monitor and hold off additional Lasix at this time   # Right breast mass - Incidental finding of 2.5 cm oval mass with few coarse calcifications over the medial right breast was noted on CTA chest - Recommend follow-up with PCP or OB/GYN outpatient diagnostic mammography   #T2DM - Hgb A1c 6.3 (12/02/2023), well-controlled at home - no dispense history for glycemic control agents - Received dexamethasone  intraoperatively and had postop hyperglycemia - Semglee and SSI    # HLD - Lipitor    #Debility - PT/OT following   #Class III / Morbid Obesity (BMI Body mass index is 51.49 kg/m. kg/m)   DVT prophylaxis: Eliquis tmrw  Code Status: Full Family Communication: None at bedside     Antimicrobials:  Anti-infectives (From admission, onward)    Start     Dose/Rate Route Frequency Ordered Stop   12/16/23 1600  metroNIDAZOLE  (FLAGYL ) IVPB 500 mg        500 mg 100 mL/hr over 60 Minutes Intravenous Every 12 hours 12/16/23 1508      12/16/23 1530  doxycycline  (VIBRAMYCIN ) 100 mg in sodium chloride  0.9 % 250 mL IVPB        100 mg 125 mL/hr over 120 Minutes Intravenous Every 12 hours 12/16/23 1508     12/13/23 2200  doxycycline  (VIBRA -TABS) tablet 100 mg  Status:  Discontinued        100 mg Oral Every 12 hours 12/13/23 1059 12/16/23 1508   12/13/23 1145  metroNIDAZOLE  (FLAGYL ) tablet 500 mg  Status:  Discontinued        500 mg Oral Every 12 hours 12/13/23 1059 12/16/23 1508   12/13/23 0000  doxycycline  (VIBRA -TABS) 100 MG tablet        100 mg Oral Every 12 hours 12/13/23 1420 12/27/23 2359   12/13/23 0000  metroNIDAZOLE  (FLAGYL ) 500 MG tablet        500 mg Oral Every 12 hours 12/13/23 1420 12/27/23 2359   12/08/23 0845  cefOXitin (MEFOXIN) 2 g in sodium chloride  0.9 % 100 mL IVPB        2 g 200 mL/hr over 30 Minutes Intravenous On call to O.R. 12/08/23 0758 12/08/23 1424   12/06/23 2000  cefTRIAXone  (ROCEPHIN ) 2 g in sodium chloride  0.9 % 100 mL IVPB        2 g 200 mL/hr over 30 Minutes Intravenous Every  24 hours 12/06/23 0723 12/10/23 2142   12/02/23 1800  doxycycline  (VIBRAMYCIN ) 100 mg in sodium chloride  0.9 % 250 mL IVPB  Status:  Discontinued        100 mg 125 mL/hr over 120 Minutes Intravenous Every 12 hours 12/02/23 1736 12/13/23 1059   12/02/23 1800  metroNIDAZOLE  (FLAGYL ) IVPB 500 mg  Status:  Discontinued        500 mg 100 mL/hr over 60 Minutes Intravenous Every 12 hours 12/02/23 1736 12/13/23 1059   12/01/23 2000  cefTRIAXone  (ROCEPHIN ) 2 g in sodium chloride  0.9 % 100 mL IVPB        2 g 200 mL/hr over 30 Minutes Intravenous Every 24 hours 12/01/23 1920 12/05/23 2052   12/01/23 1300  piperacillin-tazobactam (ZOSYN) IVPB 3.375 g        3.375 g 100 mL/hr over 30 Minutes Intravenous  Once 12/01/23 1259 12/01/23 1420        Objective: Vitals:   12/17/23 0945 12/17/23 1000 12/17/23 1011 12/17/23 1112  BP: (!) 143/93 (!) 155/94 (!) 148/73 (!) 140/56  Pulse: (!) 102 (!) 104 93 96  Resp: 20 20 19 18    Temp:   98.6 F (37 C)   TempSrc:   Oral   SpO2: 97% 96% 99% 99%  Weight:      Height:        Intake/Output Summary (Last 24 hours) at 12/17/2023 1442 Last data filed at 12/17/2023 1011 Gross per 24 hour  Intake --  Output 402 ml  Net -402 ml   Filed Weights   12/01/23 1243 12/08/23 1246  Weight: 136.1 kg 136.1 kg    Examination:  General exam: Appears calm and comfortable  Respiratory system: Respiratory effort normal. No respiratory distress. No conversational dyspnea.  Psychiatry: Judgement and insight appear normal. Mood & affect appropriate.   Data Reviewed: I have personally reviewed following labs and imaging studies  CBC: Recent Labs  Lab 12/13/23 0415 12/14/23 0723 12/15/23 0416 12/16/23 0849 12/17/23 0538  WBC 15.3* 15.7* 13.9* 14.0* 15.0*  HGB 8.9* 8.7* 9.2* 9.2* 9.0*  HCT 27.7* 27.1* 28.3* 27.8* 28.0*  MCV 95.5 95.4 95.0 94.6 95.2  PLT 404* 394 387 415* 395   Basic Metabolic Panel: Recent Labs  Lab 12/13/23 1740 12/14/23 0723 12/15/23 0416 12/16/23 0849 12/17/23 0538  NA 135 134* 133* 134* 134*  K 3.7 3.4* 3.5 3.4* 3.9  CL 99 96* 94* 97* 99  CO2 25 24 26 25 22   GLUCOSE 168* 178* 142* 154* 185*  BUN 8 10 9 9 9   CREATININE 1.00 1.05* 1.04* 1.01* 1.05*  CALCIUM  9.0 8.9 8.9 8.4* 8.6*  MG  --   --   --   --  1.8   GFR: Estimated Creatinine Clearance: 104 mL/min (A) (by C-G formula based on SCr of 1.05 mg/dL (H)). Liver Function Tests: Recent Labs  Lab 12/11/23 0057 12/12/23 0437 12/13/23 0415  AST 47* 42* 41  ALT 25 23 24   ALKPHOS 49 46 48  BILITOT 0.5 0.5 0.5  PROT 6.7 6.4* 6.3*  ALBUMIN 2.2* 2.2* 2.3*   No results for input(s): LIPASE, AMYLASE in the last 168 hours. No results for input(s): AMMONIA in the last 168 hours. Coagulation Profile: Recent Labs  Lab 12/16/23 0849 12/17/23 0538  INR 1.3* 1.2   Cardiac Enzymes: No results for input(s): CKTOTAL, CKMB, CKMBINDEX, TROPONINI in the last 168 hours. BNP  (last 3 results) No results for input(s): PROBNP in the last 8760 hours. HbA1C:  No results for input(s): HGBA1C in the last 72 hours. CBG: Recent Labs  Lab 12/16/23 0013 12/16/23 0917 12/16/23 1833 12/16/23 2154 12/17/23 1045  GLUCAP 140* 156* 140* 137* 142*   Lipid Profile: No results for input(s): CHOL, HDL, LDLCALC, TRIG, CHOLHDL, LDLDIRECT in the last 72 hours. Thyroid Function Tests: No results for input(s): TSH, T4TOTAL, FREET4, T3FREE, THYROIDAB in the last 72 hours. Anemia Panel: No results for input(s): VITAMINB12, FOLATE, FERRITIN, TIBC, IRON, RETICCTPCT in the last 72 hours. Sepsis Labs: No results for input(s): PROCALCITON, LATICACIDVEN in the last 168 hours.  Recent Results (from the past 240 hours)  Aerobic Culture w Gram Stain (superficial specimen)     Status: None   Collection Time: 12/09/23 12:31 PM   Specimen: Abdomen  Result Value Ref Range Status   Specimen Description ABDOMEN  Final   Special Requests NONE  Final   Gram Stain NO WBC SEEN NO ORGANISMS SEEN   Final   Culture   Final    NO GROWTH 2 DAYS Performed at [facility], [address] [place], [place] [address]    Report Status 12/11/2023 FINAL  Final      Radiology Studies: DG Abd 2 Views Result Date: 12/16/2023 CLINICAL DATA:  Persistent postoperative abdominal pain, nausea, and vomiting EXAM: ABDOMEN - 2 VIEW COMPARISON:  CT abdomen and pelvis dated 12/14/2023 FINDINGS: Pelvic drainage catheter and surgical staples. Nonspecific bowel gas pattern. No dilated bowel loops. Intraluminal gas is not seen at the level of the rectum. No free air or pneumatosis. No abnormal radio-opaque calculi or mass effect. No acute or substantial osseous abnormality. The sacrum and coccyx are partially obscured by overlying bowel contents. Partially imaged lung bases are clear. IMPRESSION: Nonspecific bowel gas pattern. No dilated bowel loops. Electronically Signed    By: Limin  Xu M.D.   On: 12/16/2023 13:22      Scheduled Meds:  acetaminophen   650 mg Oral Q6H   amLODipine  5 mg Oral Daily   atorvastatin  80 mg Oral Daily   chlorthalidone   25 mg Oral Daily   gabapentin  300 mg Oral TID   glycopyrrolate   0.1 mg Intravenous TID   hydrALAZINE  10 mg Intravenous Q8H   insulin  aspart  0-15 Units Subcutaneous TID WC   insulin  aspart  0-5 Units Subcutaneous QHS   insulin  glargine-yfgn  6 Units Subcutaneous Daily   losartan  50 mg Oral Daily   metoCLOPramide   5 mg Oral Q8H   ondansetron  (ZOFRAN ) IV  4 mg Intravenous Q6H   pantoprazole  (PROTONIX ) IV  40 mg Intravenous Q24H   polyethylene glycol  17 g Oral QODAY   scopolamine   1 patch Transdermal Q72H   sodium chloride  flush  5 mL Intracatheter Q8H   Continuous Infusions:  doxycycline  (VIBRAMYCIN ) IV 100 mg (12/17/23 0425)   heparin     metronidazole  500 mg (12/17/23 0302)     LOS: 16 days   Time spent: 25 minutes   Delon Hoe, DO [facility] 12/17/2023, 2:42 PM   Available via Epic secure chat 7am-7pm After these hours, please refer to coverage provider listed on [email or web address]

## 2023-12-17 NOTE — Procedures (Signed)
 Interventional Radiology Procedure Note  Procedure: CT DRAIN PELVIC ABSCESS    Complications: None  Estimated Blood Loss:  MIN  Findings: 60CC PUS CX SENT DRAIN TO SUCTION BULB    EMERSON FREDERIC SPECKING, MD

## 2023-12-17 NOTE — Progress Notes (Signed)
 VAST consult received to obtain IV access. Patient has multiple medications and heparin infusing via IV. Pt has PE and bilateral leg DVTs. Bilateral arms assessed utilizing ultrasound. No appropriate vessels for USGIV placement noted in right arm; upper arm vessels too deep for midline placement. VAST RN able to place a 22G IV in her left anterior forearm, but arm is very tender in this area.  Notified Vina Solian, MD via SecureChat of patient's extremely limited vasculature.  Unfortunately, PICC is contraindicated when DVTs are present as there is risk for further DVTs with this line placement. Advised, IF IV access is going to continue to be needed, pt might require central line placement.

## 2023-12-18 ENCOUNTER — Other Ambulatory Visit (HOSPITAL_COMMUNITY): Payer: Self-pay

## 2023-12-18 DIAGNOSIS — J9601 Acute respiratory failure with hypoxia: Secondary | ICD-10-CM

## 2023-12-18 LAB — BASIC METABOLIC PANEL WITH GFR
Anion gap: 14 (ref 5–15)
BUN: 6 mg/dL (ref 6–20)
CO2: 24 mmol/L (ref 22–32)
Calcium: 8.6 mg/dL — ABNORMAL LOW (ref 8.9–10.3)
Chloride: 99 mmol/L (ref 98–111)
Creatinine, Ser: 0.97 mg/dL (ref 0.44–1.00)
GFR, Estimated: >60 mL/min (ref >60)
Glucose, Bld: 148 mg/dL — ABNORMAL HIGH (ref 70–99)
Potassium: 3.4 mmol/L — ABNORMAL LOW (ref 3.5–5.1)
Sodium: 137 mmol/L (ref 135–145)

## 2023-12-18 LAB — CBC
HCT: 25.3 % — ABNORMAL LOW (ref 36.0–46.0)
Hemoglobin: 8.1 g/dL — ABNORMAL LOW (ref 12.0–15.0)
MCH: 30.5 pg (ref 26.0–34.0)
MCHC: 32 g/dL (ref 30.0–36.0)
MCV: 95.1 fL (ref 80.0–100.0)
Platelets: 380 K/uL (ref 150–400)
RBC: 2.66 MIL/uL — ABNORMAL LOW (ref 3.87–5.11)
RDW: 13.8 % (ref 11.5–15.5)
WBC: 10.7 K/uL — ABNORMAL HIGH (ref 4.0–10.5)
nRBC: 0 % (ref 0.0–0.2)

## 2023-12-18 LAB — GLUCOSE, CAPILLARY
Glucose-Capillary: 165 mg/dL — ABNORMAL HIGH (ref 70–99)
Glucose-Capillary: 191 mg/dL — ABNORMAL HIGH (ref 70–99)
Glucose-Capillary: 185 mg/dL — ABNORMAL HIGH (ref 70–99)
Glucose-Capillary: 171 mg/dL — ABNORMAL HIGH (ref 70–99)
Glucose-Capillary: 201 mg/dL — ABNORMAL HIGH (ref 70–99)
Glucose-Capillary: 213 mg/dL — ABNORMAL HIGH (ref 70–99)

## 2023-12-18 LAB — APTT: aPTT: 69 s — ABNORMAL HIGH (ref 24–36)

## 2023-12-18 LAB — HEPARIN LEVEL (UNFRACTIONATED): Heparin Unfractionated: 0.39 [IU]/mL (ref 0.30–0.70)

## 2023-12-18 MED ORDER — PROCHLORPERAZINE EDISYLATE 10 MG/2ML IJ SOLN: 10.0000 mg | Freq: Four times a day (QID) | INTRAMUSCULAR | Status: DC | PRN

## 2023-12-18 MED ORDER — POTASSIUM CHLORIDE 10 MEQ/100ML IV SOLN
10.0000 meq | INTRAVENOUS | Status: AC
  Administered 2023-12-18 (×2): 10 meq via INTRAVENOUS
  Filled 2023-12-18 (×2): qty 100

## 2023-12-18 MED ORDER — ONDANSETRON HCL 4 MG/2ML IJ SOLN
4.0000 mg | Freq: Four times a day (QID) | INTRAMUSCULAR | Status: DC | PRN
  Administered 2023-12-19: 4 mg via INTRAVENOUS
  Filled 2023-12-18 (×3): qty 2

## 2023-12-18 MED ORDER — APIXABAN 5 MG PO TABS
10.0000 mg | ORAL_TABLET | Freq: Two times a day (BID) | ORAL | Status: DC
  Administered 2023-12-18 – 2023-12-23 (×10): 10 mg via ORAL
  Filled 2023-12-18 (×14): qty 2

## 2023-12-18 MED ORDER — APIXABAN 5 MG PO TABS: 5.0000 mg | ORAL_TABLET | Freq: Two times a day (BID) | ORAL | Status: DC

## 2023-12-18 MED ORDER — ONDANSETRON HCL 4 MG PO TABS
4.0000 mg | ORAL_TABLET | Freq: Four times a day (QID) | ORAL | Status: DC | PRN
  Administered 2023-12-19 – 2023-12-21 (×3): 4 mg via ORAL
  Filled 2023-12-18 (×3): qty 1

## 2023-12-18 NOTE — Progress Notes (Signed)
 Patient ID: Misty Walsh, female   DOB: 06-01-1990, 33 y.o.   MRN: [id number]    Referring Physician(s): Izell Harari, MD   Supervising Physician: Jennefer Rover  Patient Status:  [facility] - In-pt  Chief Complaint:  Pelvic abscess; s/p pelvic drain placement 10/29 with Dr. Vanice  Subjective:  Pt doing well, pelvic pain/pressure improved. out from drain since placement, in addition to the 60mL aspirated at time of procedure. Very mild soreness at drain insertion site, otherwise no new complaints.  Allergies: Macrobid  [nitrofurantoin  macrocrystal]  Medications: Prior to Admission medications   Medication Sig Start Date End Date Taking? Authorizing Provider  acetaminophen  (TYLENOL ) 500 MG tablet Take 1,000-1,500 mg by mouth every 6 (six) hours as needed for mild pain (pain score 1-3) or moderate pain (pain score 4-6).   Yes [provider]  ALLERGY RELIEF 180 MG tablet Take by mouth. 06/10/22  Yes [provider]  insulin  aspart (NOVOLOG FLEXPEN) 100 UNIT/ML FlexPen Use sliding scale for TID usage with meals and at bedtime 12/14/23  Yes Zina Jerilynn LABOR, MD  insulin  glargine (LANTUS) 100 unit/mL SOPN Inject 6 Units into the skin daily. 12/14/23  Yes Zina Jerilynn LABOR, MD  Insulin  Pen Needle 32G X 4 MM MISC use 1 needle as directed then discard 12/14/23  Yes Zina Jerilynn LABOR, MD  acetaminophen  (TYLENOL ) 325 MG tablet Take 2 tablets (650 mg total) by mouth every 6 (six) hours. 12/13/23   Ozan, Jennifer, DO  amLODipine (NORVASC) 5 MG tablet Take 1 tablet (5 mg total) by mouth daily. 12/14/23   Zina Jerilynn LABOR, MD  apixaban (ELIQUIS) 5 MG TABS tablet Take 2 tablets (10 mg total) by mouth 2 (two) times daily for 5 days, THEN 1 tablet (5 mg total) 2 (two) times daily. 12/13/23 02/16/24  Ozan, Jennifer, DO  atorvastatin (LIPITOR) 80 MG tablet Take 1 tablet (80 mg total) by mouth daily. 12/14/23   Ozan, Jennifer, DO  chlorthalidone  (HYGROTON ) 25 MG tablet Take 1 tablet  (25 mg total) by mouth daily. 12/14/23   Zina Jerilynn LABOR, MD  doxycycline  (VIBRA -TABS) 100 MG tablet Take 1 tablet (100 mg total) by mouth every 12 (twelve) hours for 14 days. 12/13/23 12/27/23  Ozan, Jennifer, DO  glycopyrrolate  (ROBINUL ) 1 MG tablet Take 1 tablet (1 mg total) by mouth 3 (three) times daily. 12/14/23   Zina Jerilynn LABOR, MD  losartan (COZAAR) 50 MG tablet Take 1 tablet (50 mg total) by mouth daily. 12/14/23   Zina Jerilynn LABOR, MD  metoCLOPramide  (REGLAN ) 5 MG tablet Take 1 tablet (5 mg total) by mouth every 8 (eight) hours for 14 days. 12/14/23 12/28/23  Zina Jerilynn LABOR, MD  metroNIDAZOLE  (FLAGYL ) 500 MG tablet Take 1 tablet (500 mg total) by mouth every 12 (twelve) hours for 14 days. 12/13/23 12/27/23  Ozan, Jennifer, DO  ondansetron  (ZOFRAN ) 4 MG tablet Take 1 tablet (4 mg total) by mouth every 6 (six) hours as needed for nausea or vomiting. 12/14/23   Zina Jerilynn LABOR, MD  oxyCODONE  (OXY IR/ROXICODONE ) 5 MG immediate release tablet Take 1-2 tablets (5-10 mg total) by mouth every 6 (six) hours as needed for up to 7 days for moderate pain (pain score 4-6) or severe pain (pain score 7-10). 12/13/23 12/20/23  Ozan, Jennifer, DO  promethazine  (PHENERGAN ) 25 MG tablet Take 1 tablet (25 mg total) by mouth every 6 (six) hours as needed for refractory nausea / vomiting. 12/13/23   Ozan, Jennifer, DO  scopolamine  (TRANSDERM-SCOP) 1 MG/3DAYS Place  1 patch (1 mg total) onto the skin every 3 (three) days. 12/15/23   Zina Jerilynn LABOR, MD     Vital Signs: BP (!) 140/65 (BP Location: Right Wrist)   Pulse (!) 102   Temp 98.4 F (36.9 C) (Oral)   Resp 18   Ht 5' 4 (1.626 m)   Wt 300 lb (136.1 kg)   LMP 11/18/2023 (Exact Date)   SpO2 95%   BMI 51.49 kg/m   Physical Exam Vitals and nursing note reviewed.  Constitutional:      Appearance: Normal appearance.  Pulmonary:     Effort: Pulmonary effort is normal.  Abdominal:     Palpations: Abdomen is soft.  Musculoskeletal:     Comments:  + mild ttp surrounding right transgluteal drain insertion site. No overlying abnormality. Drain flushes and aspirates easily.   Skin:    General: Skin is warm and dry.  Neurological:     Mental Status: She is alert and oriented to person, place, and time. Mental status is at baseline.      Imaging: CT GUIDED PERITONEAL/RETROPERITONEAL FLUID DRAIN BY PERC CATH Result Date: 12/17/2023 INDICATION: Postop pelvic abscess EXAM: CT DRAINAGE OF THE RIGHT PELVIC ABSCESS (TRANSGLUTEAL APPROACH) MEDICATIONS: The patient is currently admitted to the hospital and receiving intravenous antibiotics. The antibiotics were administered within an appropriate time frame prior to the initiation of the procedure. ANESTHESIA/SEDATION: Moderate (conscious) sedation was employed during this procedure. A total of Versed 2.0 mg and Fentanyl  150 mcg was administered intravenously by the radiology nurse. Total intra-service moderate Sedation Time: 15 minutes. The patient's level of consciousness and vital signs were monitored continuously by radiology nursing throughout the procedure under my direct supervision. COMPLICATIONS: None immediate. PROCEDURE: Informed written consent was obtained from the patient after a thorough discussion of the procedural risks, benefits and alternatives. All questions were addressed. Maximal Sterile Barrier Technique was utilized including caps, mask, sterile gowns, sterile gloves, sterile drape, hand hygiene and skin antiseptic. A timeout was performed prior to the initiation of the procedure. previous imaging reviewed. patient positioned prone. noncontrast localization CT performed. the right posterior pelvic abscess was localized and marked for a trans gluteal approach. under sterile conditions and local anesthesia, the 18 gauge 15 cm access needle was advanced from a medial right trans gluteal approach into the fluid collection. needle position confirmed with CT. guidewire inserted followed by  tract dilatation to insert a 10 french drain. drain catheter position confirmed with CT. syringe aspiration yielded 60 cc purulent fluid. sample sent for culture. catheter secured with a silk suture and a sterile dressing. suction bulb applied. patient tolerated procedure well.  no immediate complication. IMPRESSION: Successful CT-guided drainage of the right posterior pelvic abscess as above. Electronically Signed   By: CHRISTELLA.  Shick M.D.   On: 12/17/2023 15:07   DG Abd 2 Views Result Date: 12/16/2023 CLINICAL DATA:  Persistent postoperative abdominal pain, nausea, and vomiting EXAM: ABDOMEN - 2 VIEW COMPARISON:  CT abdomen and pelvis dated 12/14/2023 FINDINGS: Pelvic drainage catheter and surgical staples. Nonspecific bowel gas pattern. No dilated bowel loops. Intraluminal gas is not seen at the level of the rectum. No free air or pneumatosis. No abnormal radio-opaque calculi or mass effect. No acute or substantial osseous abnormality. The sacrum and coccyx are partially obscured by overlying bowel contents. Partially imaged lung bases are clear. IMPRESSION: Nonspecific bowel gas pattern. No dilated bowel loops. Electronically Signed   By: Limin  Xu M.D.   On: 12/16/2023 13:22  CT ABDOMEN PELVIS W CONTRAST Addendum Date: 12/14/2023 ADDENDUM #1 ADDENDUM: Results were discussed with Dr. Ozen on 12/14/2023 at 07:50 pm. ---------------------------------------------------- Electronically signed by: Greig Pique MD 12/14/2023 07:53 PM EDT RP Workstation: [id number]   Result Date: 12/14/2023 ORIGINAL REPORT  EXAM: CT ABDOMEN AND PELVIS WITH CONTRAST 12/14/2023 04:31:19 PM TECHNIQUE: CT of the abdomen and pelvis was performed with the administration of 75 mL of iohexol  (OMNIPAQUE ) 350 MG/ML injection. Multiplanar reformatted images are provided for review. Automated exposure control, iterative reconstruction, and/or weight-based adjustment of the mA/kV was utilized to reduce the radiation dose to as low as  reasonably achievable. COMPARISON: CT abdomen and pelvis 12/18/2010 and MRI pelvis 12/02/2023. CLINICAL HISTORY: Abdominal pain, post-op. 75mL Omni 350 IV. Chief complaints; Nausea; CT ABDOMEN PELVIS W CONTRAST; Abdominal pain, post-op FINDINGS: LOWER CHEST: Bands of atelectasis in the bilateral lower lobes. LIVER: The liver is unremarkable. GALLBLADDER AND BILE DUCTS: Gallbladder is unremarkable. No biliary ductal dilatation. SPLEEN: No acute abnormality. PANCREAS: No acute abnormality. ADRENAL GLANDS: No acute abnormality. KIDNEYS, URETERS AND BLADDER: There is new bilateral mild hydroureteronephrosis to the level of the pelvic inlet. No obstructing calculus identified. The bladder is markedly distended and contains a small amount of air. GI AND BOWEL: Stomach demonstrates no acute abnormality. There is no bowel obstruction. The appendix is not visualized. There is wall thickening and inflammation of the sigmoid colon as it abuts pelvic fluid collection. PERITONEUM AND RETROPERITONEUM: There is a new enhancing fluid collection containing multiple bubbles of air in the pelvic cul-de-sac measuring 11 x 5 x 4.8 cm worrisome for abscess. There is marked surrounding inflammatory stranding. There are additional small cell loculated low attenuation areas abutting and just superior to this larger collection measuring up to 2.4 cm, indeterminate. Second smaller right lower quadrant fluid collection is present measuring 4.1 x 1.8 x 3.4 cm (image 3/74) abutting the anterior abdominal wall and right colon inferiorly. There is no gross free intraperitoneal air. VASCULATURE: Aorta is normal in caliber. LYMPH NODES: No lymphadenopathy. REPRODUCTIVE ORGANS: The uterus is displaced to the left and appears grossly within normal limits. There are 2 percutaneous drainage catheters in place in the pelvis. BONES AND SOFT TISSUES: No acute osseous abnormality. Mild circumscribed lobulated mass in the medial right breast measures up to 2  cm and contains punctate calcifications. There is subcutaneous edema throughout the anterior lower abdominal wall with small bubbles of air compatible with recent surgery. Small air-fluid collection is seen in the deep subcutaneous tissues just right of midline just above the level of the umbilicus measuring 2.4 x 2.1 x 2.7 cm. Tiny air fluid collection is seen in the region of the umbilicus. There is lower anterior abdominal wall skin thickening. IMPRESSION: 1. New enhancing pelvic cul-de-sac fluid collection measuring up to 11 x 5 x 4.8 cm containing multiple gas bubbles, worrisome for abscess; marked surrounding inflammatory stranding. Additional smaller adjacent loculated low-attenuation collections up to 2.4 cm are indeterminate and may represent satellite abscesses. 2. Second smaller right lower quadrant fluid collection (approximately 4.1 x 1.8 x 3.4 cm) abutting the anterior abdominal wall and right colon; correlate clinically for abscess versus postoperative collection. Two percutaneous pelvic drainage catheters in place. 3. New bilateral mild hydroureteronephrosis to the level of the pelvic inlet without obstructing calculus, likely secondary to pelvic inflammatory process and/or bladder outlet dysfunction given marked distention. 4. Markedly distended urinary bladder with a small amount of intraluminal air; correlate with recent catheterization versus emphysematous cystitis. 5. Postoperative changes of the  anterior lower abdominal wall with diffuse subcutaneous edema and small foci of subcutaneous gas. Small deep subcutaneous air-fluid collection just right of midline above the umbilicus (approximately 2.4 x 2.1 x 2.7 cm) and tiny periumbilical air-fluid collection, compatible with postoperative seromas/hematomas versus superficial abscesses; clinical correlation recommended. 6. Wall thickening of the sigmoid colon with surrounding inflammation, likely reactive colitis. Electronically signed by: Greig Pique MD 12/14/2023 07:22 PM EDT RP Workstation: [id number]    Labs:  CBC: Recent Labs    12/15/23 0416 12/16/23 0849 12/17/23 0538 12/18/23 0451  WBC 13.9* 14.0* 15.0* 10.7*  HGB 9.2* 9.2* 9.0* 8.1*  HCT 28.3* 27.8* 28.0* 25.3*  PLT 387 415* 395 380    COAGS: Recent Labs    12/05/23 0730 12/08/23 0436 12/15/23 1428 12/16/23 0849 12/17/23 0538 12/17/23 2117 12/18/23 0451  INR 1.2 1.2  --  1.3* 1.2  --   --   APTT  --   --    < > 85* 60* 79* 69*   < > = values in this interval not displayed.    BMP: Recent Labs    12/15/23 0416 12/16/23 0849 12/17/23 0538 12/18/23 0451  NA 133* 134* 134* 137  K 3.5 3.4* 3.9 3.4*  CL 94* 97* 99 99  CO2 26 25 22 24   GLUCOSE 142* 154* 185* 148*  BUN 9 9 9 6   CALCIUM  8.9 8.4* 8.6* 8.6*  CREATININE 1.04* 1.01* 1.05* 0.97  GFRNONAA >60 >60 >60 >60    LIVER FUNCTION TESTS: Recent Labs    12/10/23 0430 12/11/23 0057 12/12/23 0437 12/13/23 0415  BILITOT 0.6 0.5 0.5 0.5  AST 48* 47* 42* 41  ALT 23 25 23 24   ALKPHOS 55 49 46 48  PROT 6.3* 6.7 6.4* 6.3*  ALBUMIN 2.1* 2.2* 2.2* 2.3*    Assessment and Plan:  S/p pelvic abscess drain placement 12/17/23 - pelvic pain improved - 60cc aspirated at time of placement, with additional out overnight  Drain Location: right transgluteal Size: Fr size: 10 Fr Date of placement: 12/17/23  Currently to: Drain collection device: suction bulb 24 hour output:  Output by Drain (mL) 12/16/23 0701 - 12/16/23 1900 12/16/23 1901 - 12/17/23 0700 12/17/23 0701 - 12/17/23 1900 12/17/23 1901 - 12/18/23 0700 12/18/23 0701 - 12/18/23 1241  Closed System Drain 1 Right RLQ Bulb (JP) 7.5 2.5 2 0   Closed System Drain 2 Right LUQ Bulb (JP) 10 7.5 5 0   Closed System Drain 3 Inferior;Right Back Bulb (JP) 10 Fr.   120 10     Current examination: Flushes/aspirates easily.  Insertion site unremarkable. Suture and stat lock in place. Dressed appropriately.   Plan: Continue TID flushes  with 5 cc NS. Record output Q shift. Dressing changes QD or PRN if soiled.  Call IR APP or on call IR MD if difficulty flushing or sudden change in drain output.  Repeat imaging/possible drain injection once output < 10 mL/QD (excluding flush material). Consideration for drain removal if output is < 10 mL/QD (excluding flush material), pending discussion with the providing surgical service.  Discharge planning: Please contact IR APP or on call IR MD prior to patient d/c to ensure appropriate follow up plans are in place. Typically patient will follow up with IR clinic 10-14 days post d/c for repeat imaging/possible drain injection. IR scheduler will contact patient with date/time of appointment. Patient will need to flush drain QD with 5 cc NS, record output QD, dressing changes every 2-3 days or  earlier if soiled.   IR will continue to follow - please call with questions or concerns.     Electronically Signed: Kimble VEAR Clas, PA-C 12/18/2023, 12:37 PM   I spent a total of 15 Minutes at the the patient's bedside AND on the patient's hospital floor or unit, greater than 50% of which was counseling/coordinating care for pelvic drain follow up

## 2023-12-18 NOTE — Progress Notes (Signed)
 Daily Gynecology Note  Admission Date: 12/01/2023 Current Date: 12/18/2023 10:44 AM  Hospital Course Misty Walsh is a 33 y.o. 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 Patient started on IV abx and [facility] IR didn't feel able to drain. She was then transferred to [facility] GYN on 10/17 and IR at [facility] didn't feel able to drain, as well.   She was taken to the OR by GYN and Gen Surg on 10/20 and had a ex-lap via vertical midline/LOA/removal of left adnexal cyst wall/washout/JP drain placement x 2 (left and right posterior gutters) and closed with staples. Operative findings significant for extensive adhesions, difficulty identifying GYN anatomy with large amount of sero-sang fluid during surgery; surg path of cyst wall, cultures and gram stain all negative, as well as final admit blood cultures.   Post op, patient diagnosed with bilateral LE DVTs and PE and started on anti-coagulation.  She also had drainage of another collection by IR on 10/29, and has another drain in place.  History complicated by: Patient Active Problem List   Diagnosis Date Noted   Sepsis (HCC) 12/09/2023   AKI (acute kidney injury) 12/09/2023   Acute hypoxic respiratory failure (HCC) 12/09/2023   S/P exploratory laparotomy 12/09/2023   Tubo-ovarian abscess 12/02/2023   Acute pyelonephritis 12/02/2023   BMI 50.0-59.9, adult (HCC) 12/02/2023   Obesity, class 3 (HCC) 12/02/2023   Sepsis due to gram-negative UTI (HCC) 12/01/2023   Metabolic dysfunction-associated steatotic liver disease (MASLD) 12/26/2022   Class 3 severe obesity with serious comorbidity and body mass index (BMI) of 50.0 to 59.9 in adult (HCC) 11/20/2022   Physically inactive 11/20/2022   Cyst of right ovary 06/05/2022   Episodic cluster headache, not intractable 04/09/2022   Hypoventilation associated with obesity syndrome (HCC) 04/09/2022    Sleep-related headache 04/09/2022   Loud snoring 04/09/2022   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/2022   Anxiety and depression 04/09/2022   Adnexal mass 03/20/2022   Type 2 diabetes mellitus without complication, without long-term current use of insulin  (HCC) 02/08/2021   Symptomatic mammary hypertrophy 12/07/2019   Back pain 12/07/2019   Neck pain 12/07/2019   Prediabetes 11/04/2019   Hyperlipemia 11/04/2019   Vitamin D  deficiency 11/04/2019   Primary hypertension 03/22/2019   Hemorrhagic ovarian cyst 06/21/2017   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   Cellulitis 01/06/2013   History of cesarean section, classical 12/18/2012   Allergy 10/29/2012   Overnight/24hr events:  No acute events overnight  Subjective:  Reports mild nausea and vomiting which she associates with  her antibiotic therapy. Feels she gets nauseated when she gets this orally or via IV.  She is tolerating a little food and drink, but not significant.  Had a BM early this morning.  Denies fevers, chills, chest pain, sob.  Objective:    Current Vital Signs 24h Vital Sign Ranges  T 98.9 F (37.2 C) Temp  Avg: 98.6 F (37 C)  Min: 97.9 F (36.6 C)  Max: 99.2 F (37.3 C)  BP (!) 153/75 BP  Min: 138/71  Max: 154/74  HR (!) 108 Pulse  Avg: 102.8  Min: 96  Max: 108  RR 18 Resp  Avg: 17.8  Min: 17  Max: 18  SaO2 95 % Room Air SpO2  Avg: 97.3 %  Min: 95 %  Max: 99 %       24 Hour I/O Current  Shift I/O  Time Ins Outs 10/29 0701 - 10/30 0700 In: 1425.7 [P.O.:360; I.V.:305.7] Out: 437 [Urine:300; Drains:137] No intake/output data recorded.   Patient Vitals for the past 24 hrs:  BP Temp Temp src Pulse Resp SpO2  12/18/23 0756 (!) 153/75 98.9 F (37.2 C) Oral (!) 108 18 95 %  12/18/23 0352 (!) 154/74 98.2 F (36.8 C) Oral (!) 103 18 --  12/17/23 2340 138/71 99 F (37.2 C) Oral (!) 108 17 --  12/17/23 1948 (!) 152/77 99.2 F (37.3 C) Oral 100 18 96 %  12/17/23 1700 (!) 145/66 97.9 F (36.6  C) Oral (!) 102 18 99 %  12/17/23 1112 (!) 140/56 -- -- 96 18 99 %   Transgluteal drain with approximately 20-42mL sero-sang fluid in it, bulb on suction. Insertion site looks normal  Surgical JP drain 1 and 2 with approximately 5 mL output overnight 24h; serosang fluid in them  Physical exam: General: Well nourished, well developed female in no acute distress. Abdomen: obese, c/d/I dressing with spots of old blood. Nttp, +BS, non distended.  Cardiovascular: S1, S2 normal, no murmur, rub or gallop, regular rate and rhythm Respiratory: CTAB Skin: Warm and dry.  Ext: 2-3+ edema b/l in LEs  Medications: Current Facility-Administered Medications  Medication Dose Route Frequency Provider Last Rate Last Admin   acetaminophen  (TYLENOL ) tablet 650 mg  650 mg Oral Q6H Ozan, Jennifer, DO   650 mg at 12/17/23 1731   alum & mag hydroxide-simeth (MAALOX/MYLANTA) 200-200-20 MG/5ML suspension 30 mL  30 mL Oral Q4H PRN Jadine Toribio SQUIBB, MD   30 mL at 12/03/23 0321   amLODipine (NORVASC) tablet 5 mg  5 mg Oral Daily Al-Sultani, Anmar, MD   5 mg at 12/18/23 0909   atorvastatin (LIPITOR) tablet 80 mg  80 mg Oral Daily Lue Elsie BROCKS, MD   80 mg at 12/18/23 9068   chlorthalidone  (HYGROTON ) tablet 25 mg  25 mg Oral Daily Lue Elsie BROCKS, MD   25 mg at 12/18/23 9068   diphenhydrAMINE  (BENADRYL ) injection 50 mg  50 mg Intravenous Q8H PRN Ozan, Jennifer, DO   50 mg at 12/09/23 1652   doxycycline  (VIBRAMYCIN ) 100 mg in sodium chloride  0.9 % 250 mL IVPB  100 mg Intravenous Q12H Al-Sultani, Anmar, MD 125 mL/hr at 12/18/23 0557 100 mg at 12/18/23 0557   gabapentin (NEURONTIN) capsule 300 mg  300 mg Oral TID Ozan, Jennifer, DO   300 mg at 12/18/23 0909   glycopyrrolate  (ROBINUL ) injection 0.1 mg  0.1 mg Intravenous TID Al-Sultani, Anmar, MD   0.1 mg at 12/18/23 0907   heparin ADULT infusion 100 units/mL (25000 units/250mL)  2,000 Units/hr Intravenous Continuous Izell Harari, MD 20 mL/hr at 12/18/23  0125 2,000 Units/hr at 12/18/23 0125   hydrALAZINE (APRESOLINE) injection 10 mg  10 mg Intravenous Q8H Rojelio Nest, DO   10 mg at 12/18/23 0544   hydrALAZINE (APRESOLINE) injection 5 mg  5 mg Intravenous Q6H PRN Jadine Toribio SQUIBB, MD   5 mg at 12/04/23 9090   HYDROmorphone  (DILAUDID ) injection 1 mg  1 mg Intravenous Q3H PRN Izell Harari, MD   1 mg at 12/18/23 0931   insulin  aspart (novoLOG) injection 0-15 Units  0-15 Units Subcutaneous TID WC Al-Sultani, Anmar, MD   3 Units at 12/18/23 0905   insulin  aspart (novoLOG) injection 0-5 Units  0-5 Units Subcutaneous QHS Al-Sultani, Anmar, MD       insulin  glargine-yfgn (SEMGLEE) injection 6 Units  6 Units Subcutaneous Daily Arlon,  Carliss ORN, DO   6 Units at 12/18/23 1029   loperamide (IMODIUM) capsule 2 mg  2 mg Oral PRN Arlon Carliss ORN, DO   2 mg at 12/08/23 0951   losartan (COZAAR) tablet 50 mg  50 mg Oral Daily Al-Sultani, Anmar, MD   50 mg at 12/18/23 9092   menthol  (CEPACOL) lozenge 3 mg  1 lozenge Oral PRN Ozan, Jennifer, DO   3 mg at 12/10/23 2053   metoCLOPramide  (REGLAN ) injection 10 mg  10 mg Intravenous Q8H PRN Izell Harari, MD   10 mg at 12/17/23 2008   metoCLOPramide  (REGLAN ) tablet 5 mg  5 mg Oral Q8H Zina Jerilynn LABOR, MD   5 mg at 12/18/23 0545   metroNIDAZOLE  (FLAGYL ) IVPB 500 mg  500 mg Intravenous Q12H Al-Sultani, Anmar, MD   Stopped at 12/18/23 0505   ondansetron  (ZOFRAN ) injection 4 mg  4 mg Intravenous Q6H Al-Sultani, Anmar, MD   4 mg at 12/18/23 0546   ondansetron  (ZOFRAN ) tablet 4 mg  4 mg Oral Q6H PRN Al-Sultani, Anmar, MD   4 mg at 12/14/23 0827   Oral care mouth rinse  15 mL Mouth Rinse PRN Jadine Toribio SQUIBB, MD       oxyCODONE  (Oxy IR/ROXICODONE ) immediate release tablet 5-10 mg  5-10 mg Oral Q4H PRN Lue Elsie BROCKS, MD   5 mg at 12/17/23 1315   pantoprazole  (PROTONIX ) injection 40 mg  40 mg Intravenous Q24H Izell Harari, MD   40 mg at 12/17/23 1255   polyethylene glycol (MIRALAX  / GLYCOLAX ) packet 17 g   17 g Oral QODAY Pickens, Charlie, MD       potassium chloride  10 mEq in 100 mL IVPB  10 mEq Intravenous Q1 Hr x 2 Rojelio Nest, DO 100 mL/hr at 12/18/23 0958 10 mEq at 12/18/23 0958   prochlorperazine  (COMPAZINE ) injection 10 mg  10 mg Intravenous Q6H PRN Al-Sultani, Anmar, MD   10 mg at 12/17/23 0346   promethazine  (PHENERGAN ) tablet 25 mg  25 mg Oral Q6H PRN Zina Jerilynn LABOR, MD   25 mg at 12/18/23 9145   scopolamine  (TRANSDERM-SCOP) 1 MG/3DAYS 1 mg  1 patch Transdermal Q72H Zina Jerilynn LABOR, MD   1 mg at 12/15/23 1749   senna-docusate (Senokot-S) tablet 1 tablet  1 tablet Oral QHS PRN Izell Harari, MD       sodium chloride  flush (NS) 0.9 % injection 5 mL  5 mL Intracatheter Q8H Vanice Sharper, MD   5 mL at 12/18/23 0559   Labs:  CBGs 140s Recent Labs  Lab 12/16/23 0849 12/17/23 0538 12/18/23 0451  WBC 14.0* 15.0* 10.7*  HGB 9.2* 9.0* 8.1*  HCT 27.8* 28.0* 25.3*  PLT 415* 395 380   Recent Labs  Lab 12/12/23 0437 12/13/23 0415 12/13/23 1740 12/16/23 0849 12/17/23 0538 12/18/23 0451  NA 133* 134*   < > 134* 134* 137  K 3.0* 3.3*   < > 3.4* 3.9 3.4*  CL 96* 97*   < > 97* 99 99  CO2 25 24   < > 25 22 24   BUN 9 7   < > 9 9 6   CREATININE 1.04* 1.00   < > 1.01* 1.05* 0.97  CALCIUM  8.6* 8.7*   < > 8.4* 8.6* 8.6*  PROT 6.4* 6.3*  --   --   --   --   BILITOT 0.5 0.5  --   --   --   --   ALKPHOS 46 48  --   --   --   --  ALT 23 24  --   --   --   --   AST 42* 41  --   --   --   --   GLUCOSE 165* 162*   < > 154* 185* 148*   < > = values in this interval not displayed.   Radiology:  CT GUIDED PERITONEAL/RETROPERITONEAL FLUID DRAIN BY PERC CATH Result Date: 12/17/2023 INDICATION: Postop pelvic abscess EXAM: CT DRAINAGE OF THE RIGHT PELVIC ABSCESS (TRANSGLUTEAL APPROACH) MEDICATIONS: The patient is currently admitted to the hospital and receiving intravenous antibiotics. The antibiotics were administered within an appropriate time frame prior to the initiation of the  procedure. ANESTHESIA/SEDATION: Moderate (conscious) sedation was employed during this procedure. A total of Versed 2.0 mg and Fentanyl  150 mcg was administered intravenously by the radiology nurse. Total intra-service moderate Sedation Time: 15 minutes. The patient's level of consciousness and vital signs were monitored continuously by radiology nursing throughout the procedure under my direct supervision. COMPLICATIONS: None immediate. PROCEDURE: Informed written consent was obtained from the patient after a thorough discussion of the procedural risks, benefits and alternatives. All questions were addressed. Maximal Sterile Barrier Technique was utilized including caps, mask, sterile gowns, sterile gloves, sterile drape, hand hygiene and skin antiseptic. A timeout was performed prior to the initiation of the procedure. previous imaging reviewed. patient positioned prone. noncontrast localization CT performed. the right posterior pelvic abscess was localized and marked for a trans gluteal approach. under sterile conditions and local anesthesia, the 18 gauge 15 cm access needle was advanced from a medial right trans gluteal approach into the fluid collection. needle position confirmed with CT. guidewire inserted followed by tract dilatation to insert a 10 french drain. drain catheter position confirmed with CT. syringe aspiration yielded 60 cc purulent fluid. sample sent for culture. catheter secured with a silk suture and a sterile dressing. suction bulb applied. patient tolerated procedure well.  no immediate complication. IMPRESSION: Successful CT-guided drainage of the right posterior pelvic abscess as above. Electronically Signed   By: CHRISTELLA.  Shick M.D.   On: 12/17/2023 15:07   DG Abd 2 Views Result Date: 12/16/2023 CLINICAL DATA:  Persistent postoperative abdominal pain, nausea, and vomiting EXAM: ABDOMEN - 2 VIEW COMPARISON:  CT abdomen and pelvis dated 12/14/2023 FINDINGS: Pelvic drainage catheter and  surgical staples. Nonspecific bowel gas pattern. No dilated bowel loops. Intraluminal gas is not seen at the level of the rectum. No free air or pneumatosis. No abnormal radio-opaque calculi or mass effect. No acute or substantial osseous abnormality. The sacrum and coccyx are partially obscured by overlying bowel contents. Partially imaged lung bases are clear. IMPRESSION: Nonspecific bowel gas pattern. No dilated bowel loops. Electronically Signed   By: Limin  Xu M.D.   On: 12/16/2023 13:22     Assessment & Plan:  Patient stable and improving very slowly *GYN: See below. Continue to follow closely.  Remove staples pod# [id number]. Hospitalist team following along, given numerous co-morbidities.  *ID:  Follow up cultures, studies and IR and surgical drain output. continue doxy (D#6) & flagyl  (D#6).  Will give antiemetics prior to antibiotics.  Of note, she is s/p rocephin  10/13-10/22; cefoxitin x 1 on 10/20; doxy IV 10/13-10/25; flagyl  IV 10/14-10/24; zosyn x 1 on 10/13 *Heme: Will be transitioned from Heparin back to Eliquis today if she can continue to tolerate oral intake.  -d/w vascular surgery and medication management only recommended *Pulm: see above. No current issues.  *CV: continue current regimen *Endo: continue current regimen.  *Renal: Cr/AKI  stable *BMI 50s: PT follow and recommended rolling walker and should be fine for going home at discharge. Follow up any new recommendations.  *FEN/GI: ADAT. Saline lock IV. Pepcid . Miralax  *Dispo: Unsure of  time of disposition as she still has some barriers. Patient  did state she lives in a first floor apartment with minimal stairs and will be able to have someone stay with her whenever she is discharged.   Gloris Hugger, MD Attending Center for [facility]) GYN Consult Phone: [phone] (M-F, [phone]) & [phone]  (Off hours, weekends, holidays)

## 2023-12-18 NOTE — Progress Notes (Signed)
 PROGRESS NOTE    BRITTNIE LEWEY  FMW:[id number] DOB: May 24, 1990 DOA: 12/01/2023 PCP: Buck Search, PA-C     Brief Narrative:  Misty Walsh is a 33 year old woman PMH including diabetes, presented with abdominal pain nausea and dry heaving, presented with sepsis and huge suspected tubo-ovarian abscess.  Transferred from [facility] to [facility]. GYN was consulted. She underwent ex lap and drainage of tubo-ovarian abscess/pelvic abscess by Dr. Vanderbilt and Dr. Ozan 10/20. Post-operatively, she was found to have PE and DVT and was started on anticoagulation. Repeat CT showed new pelvic abscesses and IR was consulted for drainage.   New events last 24 hours / Subjective: States that she was able to tolerate food yesterday.  However after getting back on IV antibiotics, her nausea returned.  Assessment & Plan:  Principal Problem:   Sepsis (HCC) Active Problems:   Type 2 diabetes mellitus without complication, without long-term current use of insulin  (HCC)   Tubo-ovarian abscess   BMI 50.0-59.9, adult (HCC)   Obesity, class 3 (HCC)   AKI (acute kidney injury)   Acute hypoxic respiratory failure (HCC)   S/P exploratory laparotomy   #Acute PE #Acute bilateral DVTs - Noted to be tachypneic with worsening dyspnea.  Found to have acute PE. - CTA chest (12/10/2023) showed PE over the proximal right upper/middle lobe arteries and subsegmental left lower lobar artery with no evidence of right heart strain. - Etiology unclear, likely multifactorial given significant immobility, obesity, acute illness - Bilateral LE duplex showed acute bilateral DVTs with proximal involvement on the right - Vascular surgery consulted due to proximal right DVT - spoke to Dr. Magda and Dr. Pearline who indicated no surgical intervention at this time, only anticoagulation - Heparin drip discontinued on 10/23 - Transitioned on 10/23 to treatment dose Eliquis 10 mg BID for 7 days then 5 mg BID. - Back on heparin  drip due IR drainage procedure on 10/29 - Can transition back to Eliquis today - Recommend at least 3 months of anticoagulation for likely provoked DVT given significantly reduced mobility. May benefit from referral to outpatient hematology for further delineation of appropriate length of anticoagulation   #Acute hypoxic respiratory failure - resolved - Noted to be tachypneic with worsening dyspnea and was ultimately found to have acute PE, possibly complicated further by OHS - On room air   #Sepsis secondary to large tubo-ovarian abscess - Presented with fever, leukocytosis, noted to have large tubo-ovarian abscess - Blood cultures (10/13) negative - Completed 5 days of rocephin  - On doxycycline  and Flagyl  for TOA as below - WBC improving   # Large tubo-ovarian abscess status post ex lap w/ washout 10/20 - Surgical pathology showed benign cyst wall with cyst lining dilatation.  Negative for malignancy - Completed 5 days of Rocephin  - On doxycycline  and Flagyl   - Encouraged OOB - Incentive spirometry - CTA/P with large pelvic cul-de-sac abscess (approximately 11 x 5 x 4.8 cm) with surrounding formation and small adjacent satellite collections: Additional RLQ collection (4 cm) near the abdominal wall possibly abscess versus postop fluid.  Mild bilateral hydroureteronephrosis likely from pelvic inflammation, bladder markedly distended with some air (postcatheterization versus emphysematous cystitis).  Postop anterior abdominal wall changes with small superficial air-fluid collections (seroma/hematoma versus superficial abscess).  Sigmoid wall thickening likely reactive. - Further management per OBGYN - IR placed drain 10/29.  Culture pending    # Refractory nausea and vomiting # Refractory excessive oral secretions - Reports persistent nausea and vomiting as well as  excessive oral secretions, not responsive to current therapeutics - Currently on Zofran , Reglan , Phenergan , Scopolamine  patch  for secretions    # Constipation - Bowel regimen    # AKI - resolved - Etiology likely multifactorial - initially decreased PO intake and sepsis, now post IV contrast exposure and Lasix  - Baseline Cr ~ 0.9.  - Stable    #Hypertension - Continue chlorthalidone  25 mg daily, Losartan to 50 mg daily, and amlodipine 5 mg daily - Unable to tolerate PO meds. Added IV hydralazine   # Bilateral lower extremity swelling, R > L - Patient reports her legs are more swollen than normal, initially thought due to fluid overload and was receiving Lasix, without much improvement - However, now found to have acute bilateral DVTs, likely the main driver of her BLE swelling - Will continue to monitor and hold off additional Lasix at this time   # Right breast mass - Incidental finding of 2.5 cm oval mass with few coarse calcifications over the medial right breast was noted on CTA chest - Recommend follow-up with PCP or OB/GYN outpatient diagnostic mammography   #T2DM - Hgb A1c 6.3 (12/02/2023), well-controlled at home - no dispense history for glycemic control agents - Received dexamethasone  intraoperatively and had postop hyperglycemia - Semglee and SSI    # HLD - Lipitor    #Debility - PT/OT following   #Class III / Morbid Obesity (BMI Body mass index is 51.49 kg/m. kg/m)  #Hypokalemia - Replete   DVT prophylaxis: Eliquis  Code Status: Full Family Communication: None at bedside     Antimicrobials:  Anti-infectives (From admission, onward)    Start     Dose/Rate Route Frequency Ordered Stop   12/16/23 1600  metroNIDAZOLE  (FLAGYL ) IVPB 500 mg        500 mg 100 mL/hr over 60 Minutes Intravenous Every 12 hours 12/16/23 1508     12/16/23 1530  doxycycline  (VIBRAMYCIN ) 100 mg in sodium chloride  0.9 % 250 mL IVPB        100 mg 125 mL/hr over 120 Minutes Intravenous Every 12 hours 12/16/23 1508     12/13/23 2200  doxycycline  (VIBRA -TABS) tablet 100 mg  Status:  Discontinued         100 mg Oral Every 12 hours 12/13/23 1059 12/16/23 1508   12/13/23 1145  metroNIDAZOLE  (FLAGYL ) tablet 500 mg  Status:  Discontinued        500 mg Oral Every 12 hours 12/13/23 1059 12/16/23 1508   12/13/23 0000  doxycycline  (VIBRA -TABS) 100 MG tablet        100 mg Oral Every 12 hours 12/13/23 1420 12/27/23 2359   12/13/23 0000  metroNIDAZOLE  (FLAGYL ) 500 MG tablet        500 mg Oral Every 12 hours 12/13/23 1420 12/27/23 2359   12/08/23 0845  cefOXitin (MEFOXIN) 2 g in sodium chloride  0.9 % 100 mL IVPB        2 g 200 mL/hr over 30 Minutes Intravenous On call to O.R. 12/08/23 0758 12/08/23 1424   12/06/23 2000  cefTRIAXone  (ROCEPHIN ) 2 g in sodium chloride  0.9 % 100 mL IVPB        2 g 200 mL/hr over 30 Minutes Intravenous Every 24 hours 12/06/23 0723 12/10/23 2142   12/02/23 1800  doxycycline  (VIBRAMYCIN ) 100 mg in sodium chloride  0.9 % 250 mL IVPB  Status:  Discontinued        100 mg 125 mL/hr over 120 Minutes Intravenous Every 12 hours 12/02/23 1736  12/13/23 1059   12/02/23 1800  metroNIDAZOLE  (FLAGYL ) IVPB 500 mg  Status:  Discontinued        500 mg 100 mL/hr over 60 Minutes Intravenous Every 12 hours 12/02/23 1736 12/13/23 1059   12/01/23 2000  cefTRIAXone  (ROCEPHIN ) 2 g in sodium chloride  0.9 % 100 mL IVPB        2 g 200 mL/hr over 30 Minutes Intravenous Every 24 hours 12/01/23 1920 12/05/23 2052   12/01/23 1300  piperacillin-tazobactam (ZOSYN) IVPB 3.375 g        3.375 g 100 mL/hr over 30 Minutes Intravenous  Once 12/01/23 1259 12/01/23 1420        Objective: Vitals:   12/17/23 1948 12/17/23 2340 12/18/23 0352 12/18/23 0756  BP: (!) 152/77 138/71 (!) 154/74 (!) 153/75  Pulse: 100 (!) 108 (!) 103 (!) 108  Resp: 18 17 18 18   Temp: 99.2 F (37.3 C) 99 F (37.2 C) 98.2 F (36.8 C) 98.9 F (37.2 C)  TempSrc: Oral Oral Oral Oral  SpO2: 96%   95%  Weight:      Height:        Intake/Output Summary (Last 24 hours) at 12/18/2023 1058 Last data filed at 12/18/2023 0629 Gross  per 24 hour  Intake 1425.7 ml  Output 57 ml  Net 1368.7 ml   Filed Weights   12/01/23 1243 12/08/23 1246  Weight: 136.1 kg 136.1 kg   Examination: General exam: Appears calm and comfortable  Respiratory system: Clear to auscultation. Respiratory effort normal.  On room air Cardiovascular system: S1 & S2 heard. No pedal edema. Gastrointestinal system: Abdomen is nondistended, soft and nontender. Normal bowel sounds heard. Central nervous system: Alert and oriented. Non focal exam. Speech clear  Extremities: Symmetric in appearance bilaterally  Skin: No rashes, lesions or ulcers on exposed skin  Psychiatry: Judgement and insight appear stable. Mood & affect appropriate.    Data Reviewed: I have personally reviewed following labs and imaging studies  CBC: Recent Labs  Lab 12/14/23 0723 12/15/23 0416 12/16/23 0849 12/17/23 0538 12/18/23 0451  WBC 15.7* 13.9* 14.0* 15.0* 10.7*  HGB 8.7* 9.2* 9.2* 9.0* 8.1*  HCT 27.1* 28.3* 27.8* 28.0* 25.3*  MCV 95.4 95.0 94.6 95.2 95.1  PLT 394 387 415* 395 380   Basic Metabolic Panel: Recent Labs  Lab 12/14/23 0723 12/15/23 0416 12/16/23 0849 12/17/23 0538 12/18/23 0451  NA 134* 133* 134* 134* 137  K 3.4* 3.5 3.4* 3.9 3.4*  CL 96* 94* 97* 99 99  CO2 24 26 25 22 24   GLUCOSE 178* 142* 154* 185* 148*  BUN 10 9 9 9 6   CREATININE 1.05* 1.04* 1.01* 1.05* 0.97  CALCIUM  8.9 8.9 8.4* 8.6* 8.6*  MG  --   --   --  1.8  --    GFR: Estimated Creatinine Clearance: 112.6 mL/min (by C-G formula based on SCr of 0.97 mg/dL). Liver Function Tests: Recent Labs  Lab 12/12/23 0437 12/13/23 0415  AST 42* 41  ALT 23 24  ALKPHOS 46 48  BILITOT 0.5 0.5  PROT 6.4* 6.3*  ALBUMIN 2.2* 2.3*   No results for input(s): LIPASE, AMYLASE in the last 168 hours. No results for input(s): AMMONIA in the last 168 hours. Coagulation Profile: Recent Labs  Lab 12/16/23 0849 12/17/23 0538  INR 1.3* 1.2   Cardiac Enzymes: No results for input(s):  CKTOTAL, CKMB, CKMBINDEX, TROPONINI in the last 168 hours. BNP (last 3 results) No results for input(s): PROBNP in the last 8760 hours.  HbA1C: No results for input(s): HGBA1C in the last 72 hours. CBG: Recent Labs  Lab 12/17/23 1502 12/17/23 1708 12/17/23 2134 12/18/23 0349 12/18/23 0856  GLUCAP 251* 180* 127* 165* 191*   Lipid Profile: No results for input(s): CHOL, HDL, LDLCALC, TRIG, CHOLHDL, LDLDIRECT in the last 72 hours. Thyroid Function Tests: No results for input(s): TSH, T4TOTAL, FREET4, T3FREE, THYROIDAB in the last 72 hours. Anemia Panel: No results for input(s): VITAMINB12, FOLATE, FERRITIN, TIBC, IRON, RETICCTPCT in the last 72 hours. Sepsis Labs: No results for input(s): PROCALCITON, LATICACIDVEN in the last 168 hours.  Recent Results (from the past 240 hours)  Aerobic Culture w Gram Stain (superficial specimen)     Status: None   Collection Time: 12/09/23 12:31 PM   Specimen: Abdomen  Result Value Ref Range Status   Specimen Description ABDOMEN  Final   Special Requests NONE  Final   Gram Stain NO WBC SEEN NO ORGANISMS SEEN   Final   Culture   Final    NO GROWTH 2 DAYS Performed at [facility], [address] [place], [place] [address]    Report Status 12/11/2023 FINAL  Final  Aerobic/Anaerobic Culture w Gram Stain (surgical/deep wound)     Status: None (Preliminary result)   Collection Time: 12/17/23  9:51 AM   Specimen: Abscess  Result Value Ref Range Status   Specimen Description ABSCESS  Final   Special Requests PELVIC,RIGHT  Final   Gram Stain   Final    ABUNDANT WBC PRESENT, PREDOMINANTLY PMN NO ORGANISMS SEEN    Culture   Final    NO GROWTH < 24 HOURS Performed at [facility], [address] [place], [place] [address]    Report Status PENDING  Incomplete      Radiology Studies: CT GUIDED PERITONEAL/RETROPERITONEAL FLUID DRAIN BY PERC CATH Result Date:  12/17/2023 INDICATION: Postop pelvic abscess EXAM: CT DRAINAGE OF THE RIGHT PELVIC ABSCESS (TRANSGLUTEAL APPROACH) MEDICATIONS: The patient is currently admitted to the hospital and receiving intravenous antibiotics. The antibiotics were administered within an appropriate time frame prior to the initiation of the procedure. ANESTHESIA/SEDATION: Moderate (conscious) sedation was employed during this procedure. A total of Versed 2.0 mg and Fentanyl  150 mcg was administered intravenously by the radiology nurse. Total intra-service moderate Sedation Time: 15 minutes. The patient's level of consciousness and vital signs were monitored continuously by radiology nursing throughout the procedure under my direct supervision. COMPLICATIONS: None immediate. PROCEDURE: Informed written consent was obtained from the patient after a thorough discussion of the procedural risks, benefits and alternatives. All questions were addressed. Maximal Sterile Barrier Technique was utilized including caps, mask, sterile gowns, sterile gloves, sterile drape, hand hygiene and skin antiseptic. A timeout was performed prior to the initiation of the procedure. previous imaging reviewed. patient positioned prone. noncontrast localization CT performed. the right posterior pelvic abscess was localized and marked for a trans gluteal approach. under sterile conditions and local anesthesia, the 18 gauge 15 cm access needle was advanced from a medial right trans gluteal approach into the fluid collection. needle position confirmed with CT. guidewire inserted followed by tract dilatation to insert a 10 french drain. drain catheter position confirmed with CT. syringe aspiration yielded 60 cc purulent fluid. sample sent for culture. catheter secured with a silk suture and a sterile dressing. suction bulb applied. patient tolerated procedure well.  no immediate complication. IMPRESSION: Successful CT-guided drainage of the right posterior pelvic abscess as  above. Electronically Signed   By: CHRISTELLA.  Shick M.D.   On: 12/17/2023 15:07   DG Abd 2 Views Result Date: 12/16/2023 CLINICAL DATA:  Persistent postoperative abdominal pain, nausea, and vomiting EXAM: ABDOMEN - 2 VIEW COMPARISON:  CT abdomen and pelvis dated 12/14/2023 FINDINGS: Pelvic drainage catheter and surgical staples. Nonspecific bowel gas pattern. No dilated bowel loops. Intraluminal gas is not seen at the level of the rectum. No free air or pneumatosis. No abnormal radio-opaque calculi or mass effect. No acute or substantial osseous abnormality. The sacrum and coccyx are partially obscured by overlying bowel contents. Partially imaged lung bases are clear. IMPRESSION: Nonspecific bowel gas pattern. No dilated bowel loops. Electronically Signed   By: Limin  Xu M.D.   On: 12/16/2023 13:22      Scheduled Meds:  acetaminophen   650 mg Oral Q6H   amLODipine  5 mg Oral Daily   atorvastatin  80 mg Oral Daily   chlorthalidone   25 mg Oral Daily   gabapentin  300 mg Oral TID   glycopyrrolate   0.1 mg Intravenous TID   hydrALAZINE  10 mg Intravenous Q8H   insulin  aspart  0-15 Units Subcutaneous TID WC   insulin  aspart  0-5 Units Subcutaneous QHS   insulin  glargine-yfgn  6 Units Subcutaneous Daily   losartan  50 mg Oral Daily   metoCLOPramide   5 mg Oral Q8H   ondansetron  (ZOFRAN ) IV  4 mg Intravenous Q6H   pantoprazole  (PROTONIX ) IV  40 mg Intravenous Q24H   polyethylene glycol  17 g Oral QODAY   scopolamine   1 patch Transdermal Q72H   sodium chloride  flush  5 mL Intracatheter Q8H   Continuous Infusions:  doxycycline  (VIBRAMYCIN ) IV 100 mg (12/18/23 0557)   heparin 2,000 Units/hr (12/18/23 0125)   metronidazole  Stopped (12/18/23 0505)     LOS: 17 days   Time spent: 25 minutes   Delon Hoe, DO [facility] 12/18/2023, 10:58 AM   Available via Epic secure chat 7am-7pm After these hours, please refer to coverage provider listed on [email or web address]

## 2023-12-18 NOTE — Progress Notes (Signed)
 PHARMACY - ANTICOAGULATION CONSULT NOTE  Pharmacy Consult for heparin infusion Indication: pulmonary embolus, bilateral DVT  Allergies  Allergen Reactions   Macrobid  [Nitrofurantoin  Macrocrystal] Itching    Caused the patient to feel hot.    Patient Measurements: Height: 5' 4 (162.6 cm) Weight: 136.1 kg (300 lb) IBW/kg (Calculated) : 54.7 HEPARIN DW (KG): 88.7  Vital Signs: Temp: 98.2 F (36.8 C) (10/30 0352) Temp Source: Oral (10/30 0352) BP: 154/74 (10/30 0352) Pulse Rate: 103 (10/30 0352)  Labs: Recent Labs    12/16/23 0849 12/17/23 0538 12/17/23 2117 12/18/23 0451  HGB 9.2* 9.0*  --  8.1*  HCT 27.8* 28.0*  --  25.3*  PLT 415* 395  --  380  APTT 85* 60* 79* 69*  LABPROT 17.2* 16.3*  --   --   INR 1.3* 1.2  --   --   HEPARINUNFRC 1.00* 0.63 0.38 0.39  CREATININE 1.01* 1.05*  --  0.97    Estimated Creatinine Clearance: 112.6 mL/min (by C-G formula based on SCr of 0.97 mg/dL).   Medical History: Past Medical History:  Diagnosis Date   Anxiety    Back pain    Chlamydia    Depression med made her navel itching and  made her sleepy so she quit taking them   Diabetes mellitus without complication (HCC)    Edema, lower extremity    Elevated cholesterol    Fibrocystic breast changes of both breasts    GERD (gastroesophageal reflux disease)    History of cesarean section, classical 12/18/2012   2014    Human papilloma virus    Hypertension    Insomnia    Joint pain    Moderate dysplasia of cervix    Numbness    right arm to hand   Ovarian cyst    Prediabetes    Tubo-ovarian abscess 12/02/2023   Vitamin D  deficiency     Medications:  Infusions:   doxycycline  (VIBRAMYCIN ) IV 100 mg (12/18/23 0557)   heparin 2,000 Units/hr (12/18/23 0125)   metronidazole  Stopped (12/18/23 0505)   Assessment: Misty Walsh is a 33 year old female initially presenting to the hospital with sepsis 2/2 TOA. Found to have PE and bilateral DVTs this admission and started on  heparin drip on 10/21, which was later transitioned to Eliquis on 10/23. Pharmacy was consulted to switch back to heparin drip for drain placement with IR drain placement on 10/29.    Goal of Therapy:  Heparin level 0.3-0.7 units/ml aPTT 66-102s Monitor platelets by anticoagulation protocol: Yes  Plan:  Heparin drip currently at 2000 units/hr 0451 labs: Heparin level= 0.39, aPTT=69  Will continue current rate.  Discussion today about transitioning back to DOAC today.   Misty Walsh 12/18/2023,6:11 AM

## 2023-12-18 NOTE — Plan of Care (Signed)

## 2023-12-18 NOTE — Progress Notes (Signed)
 Patient refused dressing change. Dressings clean/dry/intact.

## 2023-12-18 NOTE — Progress Notes (Signed)
 PT Cancellation Note  Patient Details Name: Misty Walsh MRN: [id number] DOB: 1990-05-25   Cancelled Treatment:    Reason Eval/Treat Not Completed: Patient declined, no reason specified. PT attempted x 2. Pt stating it's just not a good day. She does report ambulating to/from bathroom mod I with RW last night. PT to continue efforts to mobilize.   Erven Sari Shaker 12/18/2023, 11:28 AM

## 2023-12-18 NOTE — Progress Notes (Signed)
 PHARMACY - ANTICOAGULATION CONSULT NOTE  Pharmacy Consult for heparin infusion Indication: pulmonary embolus, bilateral DVT  Allergies  Allergen Reactions   Macrobid  [Nitrofurantoin  Macrocrystal] Itching    Caused the patient to feel hot.    Patient Measurements: Height: 5' 4 (162.6 cm) Weight: 136.1 kg (300 lb) IBW/kg (Calculated) : 54.7 HEPARIN DW (KG): 88.7  Vital Signs: Temp: 98.9 F (37.2 C) (10/30 0756) Temp Source: Oral (10/30 0756) BP: 153/75 (10/30 0756) Pulse Rate: 108 (10/30 0756)  Labs: Recent Labs    12/16/23 0849 12/17/23 0538 12/17/23 2117 12/18/23 0451  HGB 9.2* 9.0*  --  8.1*  HCT 27.8* 28.0*  --  25.3*  PLT 415* 395  --  380  APTT 85* 60* 79* 69*  LABPROT 17.2* 16.3*  --   --   INR 1.3* 1.2  --   --   HEPARINUNFRC 1.00* 0.63 0.38 0.39  CREATININE 1.01* 1.05*  --  0.97    Estimated Creatinine Clearance: 112.6 mL/min (by C-G formula based on SCr of 0.97 mg/dL).   Medical History: Past Medical History:  Diagnosis Date   Anxiety    Back pain    Chlamydia    Depression med made her navel itching and  made her sleepy so she quit taking them   Diabetes mellitus without complication (HCC)    Edema, lower extremity    Elevated cholesterol    Fibrocystic breast changes of both breasts    GERD (gastroesophageal reflux disease)    History of cesarean section, classical 12/18/2012   2014    Human papilloma virus    Hypertension    Insomnia    Joint pain    Moderate dysplasia of cervix    Numbness    right arm to hand   Ovarian cyst    Prediabetes    Tubo-ovarian abscess 12/02/2023   Vitamin D  deficiency     Medications:  Infusions:   doxycycline  (VIBRAMYCIN ) IV 100 mg (12/18/23 0557)   metronidazole  Stopped (12/18/23 0505)   Assessment: Ms. Chargois is a 33 year old female initially presenting to the hospital with sepsis 2/2 TOA. Found to have PE and bilateral DVTs this admission and started on heparin drip on 10/21, which was later  transitioned to Eliquis on 10/23. Pharmacy was consulted to switch back to heparin drip for drain placement with IR drain placement on 10/29. Ms. Henigan is now 24 hours out from her IR procedure and is able to take PO medications. Will transition back to Eliquis.   Plan:  Stop Heparin drip Start Eliquis 10mg  BID x7 days - restarting 7 day load due to high clot burden, elevated BMI, and low bleed risk First dose of Eliquis to be given once heparin gtt is turned off - communicated plan with RN Eliquis 5mg  PO BID to start after 7 day load   Glenys Bidding, PharmD, BCPPS 12/18/2023 11:03 AM

## 2023-12-19 ENCOUNTER — Other Ambulatory Visit (HOSPITAL_COMMUNITY): Payer: Self-pay

## 2023-12-19 DIAGNOSIS — A419 Sepsis, unspecified organism: Secondary | ICD-10-CM

## 2023-12-19 LAB — BASIC METABOLIC PANEL WITH GFR
Anion gap: 12 (ref 5–15)
BUN: <5 mg/dL — ABNORMAL LOW (ref 6–20)
CO2: 24 mmol/L (ref 22–32)
Calcium: 8.5 mg/dL — ABNORMAL LOW (ref 8.9–10.3)
Chloride: 98 mmol/L (ref 98–111)
Creatinine, Ser: 1.01 mg/dL — ABNORMAL HIGH (ref 0.44–1.00)
GFR, Estimated: >60 mL/min (ref >60)
Glucose, Bld: 161 mg/dL — ABNORMAL HIGH (ref 70–99)
Potassium: 3.4 mmol/L — ABNORMAL LOW (ref 3.5–5.1)
Sodium: 134 mmol/L — ABNORMAL LOW (ref 135–145)

## 2023-12-19 LAB — GLUCOSE, CAPILLARY
Glucose-Capillary: 136 mg/dL — ABNORMAL HIGH (ref 70–99)
Glucose-Capillary: 154 mg/dL — ABNORMAL HIGH (ref 70–99)
Glucose-Capillary: 173 mg/dL — ABNORMAL HIGH (ref 70–99)
Glucose-Capillary: 200 mg/dL — ABNORMAL HIGH (ref 70–99)

## 2023-12-19 LAB — CBC
HCT: 25.8 % — ABNORMAL LOW (ref 36.0–46.0)
Hemoglobin: 8.3 g/dL — ABNORMAL LOW (ref 12.0–15.0)
MCH: 30.7 pg (ref 26.0–34.0)
MCHC: 32.2 g/dL (ref 30.0–36.0)
MCV: 95.6 fL (ref 80.0–100.0)
Platelets: 381 K/uL (ref 150–400)
RBC: 2.7 MIL/uL — ABNORMAL LOW (ref 3.87–5.11)
RDW: 14.1 % (ref 11.5–15.5)
WBC: 6.7 K/uL (ref 4.0–10.5)
nRBC: 0 % (ref 0.0–0.2)

## 2023-12-19 LAB — MAGNESIUM: Magnesium: 1.6 mg/dL — ABNORMAL LOW (ref 1.7–2.4)

## 2023-12-19 MED ORDER — SODIUM CHLORIDE 0.9% FLUSH
10.0000 mL | Freq: Every day | INTRAVENOUS | 0 refills | Status: DC
  Filled 2023-12-19: qty 600, 60d supply, fill #0
  Filled 2023-12-19: qty 300, 30d supply, fill #0

## 2023-12-19 MED ORDER — MAGNESIUM SULFATE 2 GM/50ML IV SOLN
2.0000 g | Freq: Once | INTRAVENOUS | Status: AC
  Administered 2023-12-19: 2 g via INTRAVENOUS
  Filled 2023-12-19 (×2): qty 50

## 2023-12-19 MED ORDER — POTASSIUM CHLORIDE 10 MEQ/100ML IV SOLN
10.0000 meq | INTRAVENOUS | Status: AC
  Administered 2023-12-19 (×4): 10 meq via INTRAVENOUS
  Filled 2023-12-19 (×4): qty 100

## 2023-12-19 MED ORDER — INSULIN GLARGINE-YFGN 100 UNIT/ML ~~LOC~~ SOLN
10.0000 [IU] | Freq: Every day | SUBCUTANEOUS | Status: DC
  Administered 2023-12-20 – 2023-12-23 (×4): 10 [IU] via SUBCUTANEOUS
  Filled 2023-12-19 (×4): qty 0.1

## 2023-12-19 NOTE — Inpatient Diabetes Management (Signed)
 Inpatient Diabetes Program Recommendations  AACE/ADA: New Consensus Statement on Inpatient Glycemic Control (2015)  Target Ranges:  Prepandial:   less than 140 mg/dL      Peak postprandial:   less than 180 mg/dL (1-2 hours)      Critically ill patients:  140 - 180 mg/dL   Lab Results  Component Value Date   GLUCAP 136 (H) 12/19/2023   HGBA1C 6.3 (H) 12/02/2023    Review of Glycemic Control  Latest Reference Range & Units 12/18/23 12:17 12/18/23 15:40 12/18/23 21:16 12/18/23 21:51 12/19/23 09:36  Glucose-Capillary 70 - 99 mg/dL 814 (H) 828 (H) 798 (H) 213 (H) 136 (H)   Diabetes history: DM 2 Outpatient Diabetes medications:  None Current orders for Inpatient glycemic control:  Novolog 0-15 units tid with meals and HS Semglee 6 units daily  Inpatient Diabetes Program Recommendations:    Please increase Semglee to 10 units daily.  Patient likely will need Metformin started at discharge if appropriate.    Thanks,  Randall Bullocks, RN, BC-ADM Inpatient Diabetes Coordinator Pager [phone]  (8a-5p)

## 2023-12-19 NOTE — Plan of Care (Signed)

## 2023-12-19 NOTE — Progress Notes (Signed)
 Patient ID: Misty Walsh, female   DOB: 05/22/1990, 33 y.o.   MRN: [id number]    Referring Physician(s): Izell Harari, MD   Supervising Physician: Luverne Aran  Patient Status:  [facility] - In-pt  Chief Complaint:  Pelvic abscess; s/p pelvic drain placement 10/29 with Dr. Vanice   Subjective:  Pt doing even better today. Soreness around drain site improved. 32 cc out overnight. Tolerating PO.  Pt reports she is planning to be discharged sometime this weekend.  Allergies: Macrobid  [nitrofurantoin  macrocrystal]  Medications: Prior to Admission medications   Medication Sig Start Date End Date Taking? Authorizing Provider  acetaminophen  (TYLENOL ) 500 MG tablet Take 1,000-1,500 mg by mouth every 6 (six) hours as needed for mild pain (pain score 1-3) or moderate pain (pain score 4-6).   Yes [provider]  ALLERGY RELIEF 180 MG tablet Take by mouth. 06/10/22  Yes [provider]  insulin  aspart (NOVOLOG FLEXPEN) 100 UNIT/ML FlexPen Use sliding scale for TID usage with meals and at bedtime 12/14/23  Yes Zina Jerilynn LABOR, MD  insulin  glargine (LANTUS) 100 unit/mL SOPN Inject 6 Units into the skin Walsh. 12/14/23  Yes Zina Jerilynn LABOR, MD  Insulin  Pen Needle 32G X 4 MM MISC use 1 needle as directed then discard 12/14/23  Yes Zina Jerilynn LABOR, MD  acetaminophen  (TYLENOL ) 325 MG tablet Take 2 tablets (650 mg total) by mouth every 6 (six) hours. 12/13/23   Ozan, Jennifer, DO  amLODipine (NORVASC) 5 MG tablet Take 1 tablet (5 mg total) by mouth Walsh. 12/14/23   Zina Jerilynn LABOR, MD  apixaban (ELIQUIS) 5 MG TABS tablet Take 2 tablets (10 mg total) by mouth 2 (two) times Walsh for 5 days, THEN 1 tablet (5 mg total) 2 (two) times Walsh. 12/13/23 02/16/24  Ozan, Jennifer, DO  atorvastatin (LIPITOR) 80 MG tablet Take 1 tablet (80 mg total) by mouth Walsh. 12/14/23   Ozan, Jennifer, DO  chlorthalidone  (HYGROTON ) 25 MG tablet Take 1 tablet (25 mg total) by mouth Walsh. 12/14/23    Zina Jerilynn LABOR, MD  doxycycline  (VIBRA -TABS) 100 MG tablet Take 1 tablet (100 mg total) by mouth every 12 (twelve) hours for 14 days. 12/13/23 12/27/23  Ozan, Jennifer, DO  glycopyrrolate  (ROBINUL ) 1 MG tablet Take 1 tablet (1 mg total) by mouth 3 (three) times Walsh. 12/14/23   Zina Jerilynn LABOR, MD  losartan (COZAAR) 50 MG tablet Take 1 tablet (50 mg total) by mouth Walsh. 12/14/23   Zina Jerilynn LABOR, MD  metoCLOPramide  (REGLAN ) 5 MG tablet Take 1 tablet (5 mg total) by mouth every 8 (eight) hours for 14 days. 12/14/23 12/28/23  Zina Jerilynn LABOR, MD  metroNIDAZOLE  (FLAGYL ) 500 MG tablet Take 1 tablet (500 mg total) by mouth every 12 (twelve) hours for 14 days. 12/13/23 12/27/23  Ozan, Jennifer, DO  ondansetron  (ZOFRAN ) 4 MG tablet Take 1 tablet (4 mg total) by mouth every 6 (six) hours as needed for nausea or vomiting. 12/14/23   Zina Jerilynn LABOR, MD  oxyCODONE  (OXY IR/ROXICODONE ) 5 MG immediate release tablet Take 1-2 tablets (5-10 mg total) by mouth every 6 (six) hours as needed for up to 7 days for moderate pain (pain score 4-6) or severe pain (pain score 7-10). 12/13/23 12/20/23  Ozan, Jennifer, DO  promethazine  (PHENERGAN ) 25 MG tablet Take 1 tablet (25 mg total) by mouth every 6 (six) hours as needed for refractory nausea / vomiting. 12/13/23   Ozan, Jennifer, DO  scopolamine  (TRANSDERM-SCOP) 1 MG/3DAYS Place 1 patch (1 mg  total) onto the skin every 3 (three) days. 12/15/23   Zina Jerilynn LABOR, MD     Vital Signs: BP (!) 141/69 (BP Location: Right Wrist)   Pulse 94   Temp 98 F (36.7 C) (Oral)   Resp 18   Ht 5' 4 (1.626 m)   Wt 300 lb (136.1 kg)   LMP 11/18/2023 (Exact Date)   SpO2 96%   BMI 51.49 kg/m   Physical Exam Vitals and nursing note reviewed.  Constitutional:      Appearance: Normal appearance.  Pulmonary:     Effort: Pulmonary effort is normal.  Abdominal:     Palpations: Abdomen is soft.  Musculoskeletal:     Comments: + right transgluteal drain. No overlying  abnormality. Drain flushes and aspirates easily.   Skin:    General: Skin is warm and dry.  Neurological:     Mental Status: She is alert and oriented to person, place, and time. Mental status is at baseline.   Imaging: CT GUIDED PERITONEAL/RETROPERITONEAL FLUID DRAIN BY PERC CATH Result Date: 12/17/2023 INDICATION: Postop pelvic abscess EXAM: CT DRAINAGE OF THE RIGHT PELVIC ABSCESS (TRANSGLUTEAL APPROACH) MEDICATIONS: The patient is currently admitted to the hospital and receiving intravenous antibiotics. The antibiotics were administered within an appropriate time frame prior to the initiation of the procedure. ANESTHESIA/SEDATION: Moderate (conscious) sedation was employed during this procedure. A total of Versed 2.0 mg and Fentanyl  150 mcg was administered intravenously by the radiology nurse. Total intra-service moderate Sedation Time: 15 minutes. The patient's level of consciousness and vital signs were monitored continuously by radiology nursing throughout the procedure under my direct supervision. COMPLICATIONS: None immediate. PROCEDURE: Informed written consent was obtained from the patient after a thorough discussion of the procedural risks, benefits and alternatives. All questions were addressed. Maximal Sterile Barrier Technique was utilized including caps, mask, sterile gowns, sterile gloves, sterile drape, hand hygiene and skin antiseptic. A timeout was performed prior to the initiation of the procedure. previous imaging reviewed. patient positioned prone. noncontrast localization CT performed. the right posterior pelvic abscess was localized and marked for a trans gluteal approach. under sterile conditions and local anesthesia, the 18 gauge 15 cm access needle was advanced from a medial right trans gluteal approach into the fluid collection. needle position confirmed with CT. guidewire inserted followed by tract dilatation to insert a 10 french drain. drain catheter position confirmed with  CT. syringe aspiration yielded 60 cc purulent fluid. sample sent for culture. catheter secured with a silk suture and a sterile dressing. suction bulb applied. patient tolerated procedure well.  no immediate complication. IMPRESSION: Successful CT-guided drainage of the right posterior pelvic abscess as above. Electronically Signed   By: CHRISTELLA.  Shick M.D.   On: 12/17/2023 15:07   DG Abd 2 Views Result Date: 12/16/2023 CLINICAL DATA:  Persistent postoperative abdominal pain, nausea, and vomiting EXAM: ABDOMEN - 2 VIEW COMPARISON:  CT abdomen and pelvis dated 12/14/2023 FINDINGS: Pelvic drainage catheter and surgical staples. Nonspecific bowel gas pattern. No dilated bowel loops. Intraluminal gas is not seen at the level of the rectum. No free air or pneumatosis. No abnormal radio-opaque calculi or mass effect. No acute or substantial osseous abnormality. The sacrum and coccyx are partially obscured by overlying bowel contents. Partially imaged lung bases are clear. IMPRESSION: Nonspecific bowel gas pattern. No dilated bowel loops. Electronically Signed   By: Limin  Xu M.D.   On: 12/16/2023 13:22    Labs:  CBC: Recent Labs    12/16/23 0849 12/17/23 9461  12/18/23 0451 12/19/23 0537  WBC 14.0* 15.0* 10.7* 6.7  HGB 9.2* 9.0* 8.1* 8.3*  HCT 27.8* 28.0* 25.3* 25.8*  PLT 415* 395 380 381    COAGS: Recent Labs    12/05/23 0730 12/08/23 0436 12/15/23 1428 12/16/23 0849 12/17/23 0538 12/17/23 2117 12/18/23 0451  INR 1.2 1.2  --  1.3* 1.2  --   --   APTT  --   --    < > 85* 60* 79* 69*   < > = values in this interval not displayed.    BMP: Recent Labs    12/16/23 0849 12/17/23 0538 12/18/23 0451 12/19/23 0537  NA 134* 134* 137 134*  K 3.4* 3.9 3.4* 3.4*  CL 97* 99 99 98  CO2 25 22 24 24   GLUCOSE 154* 185* 148* 161*  BUN 9 9 6  <5*  CALCIUM  8.4* 8.6* 8.6* 8.5*  CREATININE 1.01* 1.05* 0.97 1.01*  GFRNONAA >60 >60 >60 >60    LIVER FUNCTION TESTS: Recent Labs    12/10/23 0430  12/11/23 0057 12/12/23 0437 12/13/23 0415  BILITOT 0.6 0.5 0.5 0.5  AST 48* 47* 42* 41  ALT 23 25 23 24   ALKPHOS 55 49 46 48  PROT 6.3* 6.7 6.4* 6.3*  ALBUMIN 2.1* 2.2* 2.2* 2.3*    Assessment and Plan:  S/p right transgluteal pelvic drain placed 10/29 - drain doing well. Flushes/aspirates easily - 32 cc out overnight - IR outpatient follow up orders placed today for anticipation of near future discharge this weekend. Pt will need to flush drain with 5 mL saline once Walsh and record drain output (discussed today). IR schedulers will reach out to schedule outpatient drain follow up.  Drain Location: right transgluteal pelvic drain Size: Fr size: 10 Fr Date of placement: 12/17/23  Currently to: Drain collection device: suction bulb 24 hour output:  Output by Drain (mL) 12/17/23 0701 - 12/17/23 1900 12/17/23 1901 - 12/18/23 0700 12/18/23 0701 - 12/18/23 1900 12/18/23 1901 - 12/19/23 0700 12/19/23 0701 - 12/19/23 1454  Closed System Drain 1 Right RLQ Bulb (JP) 2 0 5 10   Closed System Drain 2 Right LUQ Bulb (JP) 5 0 15 3   Closed System Drain 3 Inferior;Right Back Bulb (JP) 10 Fr. 120 10 22 10       Current examination: Flushes/aspirates easily.  Insertion site unremarkable. Suture and stat lock in place. Dressed appropriately.   Plan while still inpatient: Continue TID flushes with 5 cc NS. Record output Q shift. Dressing changes QD or PRN if soiled.  Call IR APP or on call IR MD if difficulty flushing or sudden change in drain output.  Repeat imaging/possible drain injection once output < 10 mL/QD (excluding flush material). Consideration for drain removal if output is < 10 mL/QD (excluding flush material), pending discussion with the providing surgical service.  Discharge planning: Please contact IR APP or on call IR MD prior to patient d/c to ensure appropriate follow up plans are in place. Typically patient will follow up with IR clinic 10-14 days post d/c for repeat  imaging/possible drain injection. IR scheduler will contact patient with date/time of appointment. Patient will need to flush drain QD with 5 cc NS, record output QD, dressing changes every 2-3 days or earlier if soiled.   IR will continue to follow - please call with questions or concerns.     Electronically Signed: Kimble VEAR Clas, PA-C 12/19/2023, 2:51 PM   I spent a total of 15 Minutes at the the  patient's bedside AND on the patient's hospital floor or unit, greater than 50% of which was counseling/coordinating care for right transgluteal pelvic drain.

## 2023-12-19 NOTE — Progress Notes (Signed)
 Physical Therapy Treatment Patient Details Name: Misty Walsh MRN: [id number] DOB: Jun 24, 1990 Today's Date: 12/19/2023   History of Present Illness Pt is 33 year old presented to [facility] on 12/01/23 for nausea and abd pain. Found to have tubo-ovarian abscess. Transferred to [facility] on 12/04/23. Pt underwent exploratory lap, lysis of adhesions and removal of lt cyst wall on 12/08/23. Pt found to have acute PE and acute bilateral DVT 10/22. Drain placed by IR 10/29. PMH - DM, morbid obesity, HTN    PT Comments  The pt was agreeable to session with focus on mobility progression, able to greatly improve ambulation distance but continues to benefit from cues for posture, proximity to RW, and progressive trunk extension with gait. Pt also educated on LE exercises she can complete from seated position to complete in addition to ambulation outside of PT sessions. Will continue to follow acutely to progress pt's mobility, activity tolerance, recommendations remain appropriate.    If plan is discharge home, recommend the following: Assistance with cooking/housework;Assist for transportation   Can travel by private vehicle        Equipment Recommendations  Rolling walker (2 wheels)    Recommendations for Other Services       Precautions / Restrictions Precautions Precautions: Other (comment) Recall of Precautions/Restrictions: Intact Precaution/Restrictions Comments: abd drains, watch sats Required Braces or Orthoses: Other Brace Other Brace: abdominal binder Restrictions Weight Bearing Restrictions Per Provider Order: No     Mobility  Bed Mobility Overal bed mobility: Needs Assistance Bed Mobility: Sit to Sidelying         Sit to sidelying: Min assist General bed mobility comments: minA to manage trunk elevation, HOB flat    Transfers Overall transfer level: Needs assistance Equipment used: Rolling walker (2 wheels) Transfers: Sit to/from Stand Sit to Stand: Supervision            General transfer comment: cues for hand placement repeated in session. completed x4    Ambulation/Gait Ambulation/Gait assistance: Supervision Gait Distance (Feet): 250 Feet Assistive device: Rolling walker (2 wheels) Gait Pattern/deviations: Step-through pattern, Decreased stride length, Wide base of support Gait velocity: 0.21 m/s Gait velocity interpretation: <1.31 ft/sec, indicative of household ambulator   General Gait Details: standing rest x3, slowed speed with dependence on UE support. cues for relaxed shoulders and proximity to RW for decreased trunk flexion      Balance Overall balance assessment: No apparent balance deficits (not formally assessed)                                          Communication Communication Communication: No apparent difficulties  Cognition Arousal: Alert Behavior During Therapy: Flat affect   PT - Cognitive impairments: No apparent impairments                         Following commands: Intact      Cueing Cueing Techniques: Verbal cues  Exercises General Exercises - Lower Extremity Ankle Circles/Pumps: AROM, Both, 15 reps Long Arc Quad: AROM, Both, 10 reps, Seated (with 3 sec hold) Heel Raises: AROM, Both, 15 reps, Seated Other Exercises Other Exercises: sit-stand from chair x5 Other Exercises: incentive spirometer x5 to    General Comments General comments (skin integrity, edema, etc.): VSS      Pertinent Vitals/Pain Pain Assessment Pain Assessment: 0-10 Pain Score: 7  Faces Pain Scale: Hurts little  more Pain Location: abdomen Pain Descriptors / Indicators: Aching, Grimacing Pain Intervention(s): Limited activity within patient's tolerance, Monitored during session, Repositioned     PT Goals (current goals can now be found in the care plan section) Acute Rehab PT Goals Patient Stated Goal: to return to independence PT Goal Formulation: With patient Time For Goal Achievement:  01/02/24 Potential to Achieve Goals: Good Progress towards PT goals: Progressing toward goals    Frequency    Min 3X/week       AM-PAC PT 6 Clicks Mobility   Outcome Measure  Help needed turning from your back to your side while in a flat bed without using bedrails?: A Little Help needed moving from lying on your back to sitting on the side of a flat bed without using bedrails?: A Little Help needed moving to and from a bed to a chair (including a wheelchair)?: A Little Help needed standing up from a chair using your arms (e.g., wheelchair or bedside chair)?: A Little Help needed to walk in hospital room?: A Little Help needed climbing 3-5 steps with a railing? : A Little 6 Click Score: 18    End of Session   Activity Tolerance: Patient tolerated treatment well Patient left: in chair;with call bell/phone within reach;with family/visitor present Nurse Communication: Mobility status PT Visit Diagnosis: Muscle weakness (generalized) (M62.81);Difficulty in walking, not elsewhere classified (R26.2);Pain Pain - part of body:  (abdomen and back)     Time: 8767-8692 PT Time Calculation (min) (ACUTE ONLY): 35 min  Charges:    $Gait Training: 8-22 mins $Therapeutic Exercise: 8-22 mins PT General Charges $$ ACUTE PT VISIT: 1 Visit                     Izetta Call, PT, DPT   Acute Rehabilitation Department Office [phone] Secure Chat Communication Preferred   Izetta JULIANNA Call 12/19/2023, 1:25 PM

## 2023-12-19 NOTE — Progress Notes (Signed)
 PROGRESS NOTE    Misty Walsh  FMW:[id number] DOB: [birth year] DOA: 12/01/2023 PCP: Buck Search, PA-C     Brief Narrative:  Misty Walsh is a 33 year old woman PMH including diabetes, presented with abdominal pain nausea and dry heaving, presented with sepsis and huge suspected tubo-ovarian abscess.  Transferred from [facility] to [facility]. GYN was consulted. She underwent ex lap and drainage of tubo-ovarian abscess/pelvic abscess by Dr. Vanderbilt and Dr. Ozan 10/20. Post-operatively, she was found to have PE and DVT and was started on anticoagulation. Repeat CT showed new pelvic abscesses and IR was consulted for drainage.   New events last 24 hours / Subjective: She reports nausea and vomiting this morning however she has been able to keep down lunch. She feels better overall, ambulated the halls this afternoon.    Assessment & Plan:  Principal Problem:   Sepsis (HCC) Active Problems:   Type 2 diabetes mellitus without complication, without long-term current use of insulin  (HCC)   Tubo-ovarian abscess   BMI 50.0-59.9, adult (HCC)   Obesity, class 3 (HCC)   AKI (acute kidney injury)   Acute hypoxic respiratory failure (HCC)   S/P exploratory laparotomy   #Acute PE #Acute bilateral DVTs - Noted to be tachypneic with worsening dyspnea.  Found to have acute PE. - CTA chest (12/10/2023) showed PE over the proximal right upper/middle lobe arteries and subsegmental left lower lobar artery with no evidence of right heart strain. - Etiology unclear, likely multifactorial given significant immobility, obesity, acute illness - Bilateral LE duplex showed acute bilateral DVTs with proximal involvement on the right - Vascular surgery consulted due to proximal right DVT - spoke to Dr. Magda and Dr. Pearline who indicated no surgical intervention at this time, only anticoagulation - Heparin drip discontinued on 10/23 - Transitioned on 10/23 to treatment dose Eliquis 10 mg BID for 7 days  then 5 mg BID. - Back on heparin drip due IR drainage procedure on 10/29 - Can transition back to Eliquis today - Recommend at least 3 months of anticoagulation for likely provoked DVT given significantly reduced mobility. May benefit from referral to outpatient hematology for further delineation of appropriate length of anticoagulation   #Acute hypoxic respiratory failure - resolved - Noted to be tachypneic with worsening dyspnea and was ultimately found to have acute PE, possibly complicated further by OHS - On room air   #Sepsis secondary to large tubo-ovarian abscess - Presented with fever, leukocytosis, noted to have large tubo-ovarian abscess - Blood cultures (10/13) negative - Completed 5 days of rocephin  - On doxycycline  and Flagyl  for TOA as below - WBC improving   # Large tubo-ovarian abscess status post ex lap w/ washout 10/20 - Surgical pathology showed benign cyst wall with cyst lining dilatation.  Negative for malignancy - Completed 5 days of Rocephin  - On doxycycline  and Flagyl   - Encouraged OOB - Incentive spirometry - CTA/P with large pelvic cul-de-sac abscess (approximately 11 x 5 x 4.8 cm) with surrounding formation and small adjacent satellite collections: Additional RLQ collection (4 cm) near the abdominal wall possibly abscess versus postop fluid.  Mild bilateral hydroureteronephrosis likely from pelvic inflammation, bladder markedly distended with some air (postcatheterization versus emphysematous cystitis).  Postop anterior abdominal wall changes with small superficial air-fluid collections (seroma/hematoma versus superficial abscess).  Sigmoid wall thickening likely reactive. - Further management per OBGYN - IR placed drain 10/29.  Culture pending    # Refractory nausea and vomiting # Refractory excessive oral secretions - Reports  persistent nausea and vomiting as well as excessive oral secretions, not responsive to current therapeutics - Currently on Zofran ,  Reglan , Phenergan , Scopolamine  patch for secretions    # Constipation - Bowel regimen    # AKI - resolved - Etiology likely multifactorial - initially decreased PO intake and sepsis, now post IV contrast exposure and Lasix  - Baseline Cr ~ 0.9.  - Stable    #Hypertension - Continue chlorthalidone  25 mg daily, Losartan to 50 mg daily, and amlodipine 5 mg daily - Unable to tolerate PO meds. Added IV hydralazine   # Bilateral lower extremity swelling, R > L - Patient reports her legs are more swollen than normal, initially thought due to fluid overload and was receiving Lasix, without much improvement - However, now found to have acute bilateral DVTs, likely the main driver of her BLE swelling - Will continue to monitor and hold off additional Lasix at this time   # Right breast mass - Incidental finding of 2.5 cm oval mass with few coarse calcifications over the medial right breast was noted on CTA chest - Recommend follow-up with PCP or OB/GYN outpatient diagnostic mammography   #T2DM - Hgb A1c 6.3 (12/02/2023), well-controlled at home - no dispense history for glycemic control agents - Received dexamethasone  intraoperatively and had postop hyperglycemia - Semglee and SSI    # HLD - Lipitor    #Debility - PT/OT following   #Class III / Morbid Obesity (BMI Body mass index is 51.49 kg/m. kg/m)  #Hypokalemia - Replete   DVT prophylaxis: Eliquis apixaban (ELIQUIS) tablet 10 mg  apixaban (ELIQUIS) tablet 5 mg  Code Status: Full Family Communication: None at bedside     Antimicrobials:  Anti-infectives (From admission, onward)    Start     Dose/Rate Route Frequency Ordered Stop   12/16/23 1600  metroNIDAZOLE  (FLAGYL ) IVPB 500 mg        500 mg 100 mL/hr over 60 Minutes Intravenous Every 12 hours 12/16/23 1508     12/16/23 1530  doxycycline  (VIBRAMYCIN ) 100 mg in sodium chloride  0.9 % 250 mL IVPB        100 mg 125 mL/hr over 120 Minutes Intravenous Every 12 hours  12/16/23 1508     12/13/23 2200  doxycycline  (VIBRA -TABS) tablet 100 mg  Status:  Discontinued        100 mg Oral Every 12 hours 12/13/23 1059 12/16/23 1508   12/13/23 1145  metroNIDAZOLE  (FLAGYL ) tablet 500 mg  Status:  Discontinued        500 mg Oral Every 12 hours 12/13/23 1059 12/16/23 1508   12/13/23 0000  doxycycline  (VIBRA -TABS) 100 MG tablet        100 mg Oral Every 12 hours 12/13/23 1420 12/27/23 2359   12/13/23 0000  metroNIDAZOLE  (FLAGYL ) 500 MG tablet        500 mg Oral Every 12 hours 12/13/23 1420 12/27/23 2359   12/08/23 0845  cefOXitin (MEFOXIN) 2 g in sodium chloride  0.9 % 100 mL IVPB        2 g 200 mL/hr over 30 Minutes Intravenous On call to O.R. 12/08/23 0758 12/08/23 1424   12/06/23 2000  cefTRIAXone  (ROCEPHIN ) 2 g in sodium chloride  0.9 % 100 mL IVPB        2 g 200 mL/hr over 30 Minutes Intravenous Every 24 hours 12/06/23 0723 12/10/23 2142   12/02/23 1800  doxycycline  (VIBRAMYCIN ) 100 mg in sodium chloride  0.9 % 250 mL IVPB  Status:  Discontinued  100 mg 125 mL/hr over 120 Minutes Intravenous Every 12 hours 12/02/23 1736 12/13/23 1059   12/02/23 1800  metroNIDAZOLE  (FLAGYL ) IVPB 500 mg  Status:  Discontinued        500 mg 100 mL/hr over 60 Minutes Intravenous Every 12 hours 12/02/23 1736 12/13/23 1059   12/01/23 2000  cefTRIAXone  (ROCEPHIN ) 2 g in sodium chloride  0.9 % 100 mL IVPB        2 g 200 mL/hr over 30 Minutes Intravenous Every 24 hours 12/01/23 1920 12/05/23 2052   12/01/23 1300  piperacillin-tazobactam (ZOSYN) IVPB 3.375 g        3.375 g 100 mL/hr over 30 Minutes Intravenous  Once 12/01/23 1259 12/01/23 1420        Objective: Vitals:   12/19/23 0812 12/19/23 1205 12/19/23 1555 12/19/23 1947  BP: (!) 144/57 (!) 141/69 139/60 (!) 162/68  Pulse: 98 94 (!) 102 (!) 103  Resp: 18 18 18 18   Temp: 98.2 F (36.8 C) 98 F (36.7 C) 98.1 F (36.7 C) 98.1 F (36.7 C)  TempSrc: Oral Oral Oral Oral  SpO2: 95% 96% 98%   Weight:      Height:         Intake/Output Summary (Last 24 hours) at 12/19/2023 2136 Last data filed at 12/19/2023 1459 Gross per 24 hour  Intake 5 ml  Output 23 ml  Net -18 ml   Filed Weights   12/01/23 1243 12/08/23 1246  Weight: 136.1 kg 136.1 kg   Examination: General exam: Appears calm and comfortable  Respiratory system: Clear to auscultation. Respiratory effort normal.  On room air Cardiovascular system: S1 & S2 heard. No pedal edema. Gastrointestinal system: Abdomen is nondistended, soft and nontender. Normal bowel sounds heard. Central nervous system: Alert and oriented. Non focal exam. Speech clear  Extremities: Symmetric in appearance bilaterally  Skin: No rashes, lesions or ulcers on exposed skin  Psychiatry: Judgement and insight appear stable. Mood & affect appropriate.    Data Reviewed: I have personally reviewed following labs and imaging studies  CBC: Recent Labs  Lab 12/15/23 0416 12/16/23 0849 12/17/23 0538 12/18/23 0451 12/19/23 0537  WBC 13.9* 14.0* 15.0* 10.7* 6.7  HGB 9.2* 9.2* 9.0* 8.1* 8.3*  HCT 28.3* 27.8* 28.0* 25.3* 25.8*  MCV 95.0 94.6 95.2 95.1 95.6  PLT 387 415* 395 380 381   Basic Metabolic Panel: Recent Labs  Lab 12/15/23 0416 12/16/23 0849 12/17/23 0538 12/18/23 0451 12/19/23 0537  NA 133* 134* 134* 137 134*  K 3.5 3.4* 3.9 3.4* 3.4*  CL 94* 97* 99 99 98  CO2 26 25 22 24 24   GLUCOSE 142* 154* 185* 148* 161*  BUN 9 9 9 6  <5*  CREATININE 1.04* 1.01* 1.05* 0.97 1.01*  CALCIUM  8.9 8.4* 8.6* 8.6* 8.5*  MG  --   --  1.8  --  1.6*   GFR: Estimated Creatinine Clearance: 108.2 mL/min (A) (by C-G formula based on SCr of 1.01 mg/dL (H)). Liver Function Tests: Recent Labs  Lab 12/13/23 0415  AST 41  ALT 24  ALKPHOS 48  BILITOT 0.5  PROT 6.3*  ALBUMIN 2.3*   No results for input(s): LIPASE, AMYLASE in the last 168 hours. No results for input(s): AMMONIA in the last 168 hours. Coagulation Profile: Recent Labs  Lab 12/16/23 0849  12/17/23 0538  INR 1.3* 1.2   Cardiac Enzymes: No results for input(s): CKTOTAL, CKMB, CKMBINDEX, TROPONINI in the last 168 hours. BNP (last 3 results) No results for input(s): PROBNP in  the last 8760 hours. HbA1C: No results for input(s): HGBA1C in the last 72 hours. CBG: Recent Labs  Lab 12/18/23 2116 12/18/23 2151 12/19/23 0936 12/19/23 1256 12/19/23 1648  GLUCAP 201* 213* 136* 173* 154*   Lipid Profile: No results for input(s): CHOL, HDL, LDLCALC, TRIG, CHOLHDL, LDLDIRECT in the last 72 hours. Thyroid Function Tests: No results for input(s): TSH, T4TOTAL, FREET4, T3FREE, THYROIDAB in the last 72 hours. Anemia Panel: No results for input(s): VITAMINB12, FOLATE, FERRITIN, TIBC, IRON, RETICCTPCT in the last 72 hours. Sepsis Labs: No results for input(s): PROCALCITON, LATICACIDVEN in the last 168 hours.  Recent Results (from the past 240 hours)  Aerobic/Anaerobic Culture w Gram Stain (surgical/deep wound)     Status: None (Preliminary result)   Collection Time: 12/17/23  9:51 AM   Specimen: Abscess  Result Value Ref Range Status   Specimen Description ABSCESS  Final   Special Requests PELVIC,RIGHT  Final   Gram Stain   Final    ABUNDANT WBC PRESENT, PREDOMINANTLY PMN NO ORGANISMS SEEN    Culture   Final    NO GROWTH 2 DAYS NO ANAEROBES ISOLATED; CULTURE IN PROGRESS FOR 5 DAYS Performed at [facility], [address] [place], [place] [address]    Report Status PENDING  Incomplete      Radiology Studies: No results found.     Scheduled Meds:  acetaminophen   650 mg Oral Q6H   amLODipine  5 mg Oral Daily   apixaban  10 mg Oral BID   Followed by   NOREEN ON 12/25/2023] apixaban  5 mg Oral BID   atorvastatin  80 mg Oral Daily   chlorthalidone   25 mg Oral Daily   gabapentin  300 mg Oral TID   glycopyrrolate   0.1 mg Intravenous TID   hydrALAZINE  10 mg Intravenous Q8H   insulin  aspart  0-15 Units  Subcutaneous TID WC   insulin  aspart  0-5 Units Subcutaneous QHS   [START ON 12/20/2023] insulin  glargine-yfgn  10 Units Subcutaneous Daily   losartan  50 mg Oral Daily   metoCLOPramide   5 mg Oral Q8H   pantoprazole  (PROTONIX ) IV  40 mg Intravenous Q24H   polyethylene glycol  17 g Oral QODAY   scopolamine   1 patch Transdermal Q72H   sodium chloride  flush  5 mL Intracatheter Q8H   Continuous Infusions:  doxycycline  (VIBRAMYCIN ) IV 100 mg (12/19/23 1818)   metronidazole  500 mg (12/19/23 1625)     LOS: 18 days   Time spent: 25 minutes   Landon FORBES Baller, MD [facility] 12/19/2023, 9:36 PM   Available via Epic secure chat 7am-7pm After these hours, please refer to coverage provider listed on [email or web address]

## 2023-12-19 NOTE — Progress Notes (Signed)
 Daily Gynecology Note  Admission Date: 12/01/2023 Current Date: 12/19/2023 3:04 PM  Hospital Course Misty Walsh is a 33 y.o. 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 Patient started on IV abx and [facility] IR didn't feel able to drain. She was then transferred to [facility] GYN on 10/17 and IR at [facility] didn't feel able to drain, as well.   She was taken to the OR by GYN and Gen Surg on 10/20 and had a ex-lap via vertical midline/LOA/removal of left adnexal cyst wall/washout/JP drain placement x 2 (left and right posterior gutters) and closed with staples. Operative findings significant for extensive adhesions, difficulty identifying GYN anatomy with large amount of sero-sang fluid during surgery; surg path of cyst wall, cultures and gram stain all negative, as well as final admit blood cultures.   Post op, patient diagnosed with bilateral LE DVTs and PE and started on anti-coagulation.    She also had drainage of another collection by IR on 10/29, and has a transgluteal drain in place.  Overnight/24hr events:  No acute events overnight  Subjective:  Her main concern today is her nausea and itching. She was not able to tolerate breakfast and vomited but ate half of a sandwich for lunch and kept it today.  She has ongoing lower abdominal and back pain but feels like this is improving overall.  Her other concern this afternoon is diffuse itching for the past few days. They tried stopping dilaudid  for pain but did not have much improvement.   Objective:    Current Vital Signs 24h Vital Sign Ranges  T 98 F (36.7 C) Temp  Avg: 98.2 F (36.8 C)  Min: 98 F (36.7 C)  Max: 98.4 F (36.9 C)  BP (!) 141/69 BP  Min: 135/62  Max: 144/57  HR 94 Pulse  Avg: 100.8  Min: 94  Max: 106  RR 18 Resp  Avg: 20  Min: 18  Max: 24  SaO2 96 % Room Air SpO2  Avg: 95.5 %  Min: 94 %  Max: 97 %       24  Hour I/O Current Shift I/O  Time Ins Outs 10/30 0701 - 10/31 0700 In: 760.8  Out: 65 [Drains:65] 10/31 0701 - 10/31 1900 In: 5  Out: 5 [Drains:5]   Patient Vitals for the past 24 hrs:  BP Temp Temp src Pulse Resp SpO2  12/19/23 1205 (!) 141/69 98 F (36.7 C) Oral 94 18 96 %  12/19/23 0812 (!) 144/57 98.2 F (36.8 C) Oral 98 18 95 %  12/19/23 0325 137/65 -- -- (!) 104 -- 94 %  12/19/23 0321 -- 98.4 F (36.9 C) Oral -- (!) 24 --  12/18/23 2342 135/62 98 F (36.7 C) Oral (!) 102 20 97 %  12/18/23 1947 (!) 140/74 98.2 F (36.8 C) Oral (!) 106 20 --   Transgluteal drain minimal sero-sang fluid in it, bulb on suction.  Surgical JP drain 1 and 2 with 15-20cc output overnight. Small amount of serosang fluid in bulb.   Physical exam: General: Well nourished, well developed female in no acute distress. Abdomen: obese, c/d/I dressing with spots of old blood. Nttp, +BS, non distended.  Cardiovascular: normal rate Respiratory: normal effort Skin: Warm and dry.  Ext: 2-3+ edema b/l in LEs  Medications: Current Facility-Administered Medications  Medication Dose Route Frequency Provider Last Rate Last Admin   acetaminophen  (TYLENOL ) tablet 650  mg  650 mg Oral Q6H Ozan, Jennifer, DO   650 mg at 12/19/23 1231   alum & mag hydroxide-simeth (MAALOX/MYLANTA) 200-200-20 MG/5ML suspension 30 mL  30 mL Oral Q4H PRN Jadine Toribio SQUIBB, MD   30 mL at 12/03/23 0321   amLODipine (NORVASC) tablet 5 mg  5 mg Oral Daily Al-Sultani, Anmar, MD   5 mg at 12/19/23 0919   apixaban (ELIQUIS) tablet 10 mg  10 mg Oral BID Zina Jerilynn LABOR, MD   10 mg at 12/19/23 9080   Followed by   NOREEN ON 12/25/2023] apixaban (ELIQUIS) tablet 5 mg  5 mg Oral BID Zina Jerilynn LABOR, MD       atorvastatin (LIPITOR) tablet 80 mg  80 mg Oral Daily Lue Elsie BROCKS, MD   80 mg at 12/19/23 9081   chlorthalidone  (HYGROTON ) tablet 25 mg  25 mg Oral Daily Lue Elsie BROCKS, MD   25 mg at 12/19/23 9081   diphenhydrAMINE   (BENADRYL ) injection 50 mg  50 mg Intravenous Q8H PRN Ozan, Jennifer, DO   50 mg at 12/18/23 1549   doxycycline  (VIBRAMYCIN ) 100 mg in sodium chloride  0.9 % 250 mL IVPB  100 mg Intravenous Q12H Al-Sultani, Anmar, MD 125 mL/hr at 12/19/23 0528 100 mg at 12/19/23 0528   gabapentin (NEURONTIN) capsule 300 mg  300 mg Oral TID Ozan, Jennifer, DO   300 mg at 12/19/23 9080   glycopyrrolate  (ROBINUL ) injection 0.1 mg  0.1 mg Intravenous TID Al-Sultani, Anmar, MD   0.1 mg at 12/19/23 0920   hydrALAZINE (APRESOLINE) injection 10 mg  10 mg Intravenous Q8H Rojelio Nest, DO   10 mg at 12/19/23 1452   hydrALAZINE (APRESOLINE) injection 5 mg  5 mg Intravenous Q6H PRN Jadine Toribio SQUIBB, MD   5 mg at 12/04/23 9090   HYDROmorphone  (DILAUDID ) injection 1 mg  1 mg Intravenous Q3H PRN Izell Harari, MD   1 mg at 12/19/23 1102   insulin  aspart (novoLOG) injection 0-15 Units  0-15 Units Subcutaneous TID WC Al-Sultani, Anmar, MD   3 Units at 12/19/23 1301   insulin  aspart (novoLOG) injection 0-5 Units  0-5 Units Subcutaneous QHS Al-Sultani, Anmar, MD   2 Units at 12/18/23 2120   [START ON 12/20/2023] insulin  glargine-yfgn (SEMGLEE) injection 10 Units  10 Units Subcutaneous Daily Erik Felts C, MD       loperamide (IMODIUM) capsule 2 mg  2 mg Oral PRN Arlon Carliss ORN, DO   2 mg at 12/08/23 0951   losartan (COZAAR) tablet 50 mg  50 mg Oral Daily Al-Sultani, Anmar, MD   50 mg at 12/19/23 0919   menthol  (CEPACOL) lozenge 3 mg  1 lozenge Oral PRN Ozan, Jennifer, DO   3 mg at 12/10/23 2053   metoCLOPramide  (REGLAN ) injection 10 mg  10 mg Intravenous Q8H PRN Izell Harari, MD   10 mg at 12/17/23 2008   metoCLOPramide  (REGLAN ) tablet 5 mg  5 mg Oral Q8H Zina Jerilynn LABOR, MD   5 mg at 12/19/23 1451   metroNIDAZOLE  (FLAGYL ) IVPB 500 mg  500 mg Intravenous Q12H Al-Sultani, Anmar, MD 100 mL/hr at 12/19/23 0317 500 mg at 12/19/23 0317   ondansetron  (ZOFRAN ) injection 4 mg  4 mg Intravenous Q6H PRN Anyanwu, Ugonna A, MD    4 mg at 12/19/23 1104   Or   ondansetron  (ZOFRAN ) tablet 4 mg  4 mg Oral Q6H PRN Anyanwu, Ugonna A, MD   4 mg at 12/19/23 0307   Oral care mouth rinse  15 mL Mouth Rinse PRN Jadine Toribio SQUIBB, MD       oxyCODONE  (Oxy IR/ROXICODONE ) immediate release tablet 5-10 mg  5-10 mg Oral Q4H PRN Lue Elsie BROCKS, MD   10 mg at 12/18/23 2104   pantoprazole  (PROTONIX ) injection 40 mg  40 mg Intravenous Q24H Izell Harari, MD   40 mg at 12/19/23 1231   polyethylene glycol (MIRALAX  / GLYCOLAX ) packet 17 g  17 g Oral QODAY Pickens, Charlie, MD   17 g at 12/18/23 1237   potassium chloride  10 mEq in 100 mL IVPB  10 mEq Intravenous Q1 Hr x 4 Cleatus Moccasin, MD 100 mL/hr at 12/19/23 1450 10 mEq at 12/19/23 1450   prochlorperazine  (COMPAZINE ) injection 10 mg  10 mg Intravenous Q6H PRN Anyanwu, Gloris LABOR, MD       promethazine  (PHENERGAN ) tablet 25 mg  25 mg Oral Q6H PRN Zina Jerilynn LABOR, MD   25 mg at 12/18/23 2006   scopolamine  (TRANSDERM-SCOP) 1 MG/3DAYS 1 mg  1 patch Transdermal Q72H Zina Jerilynn LABOR, MD   1 mg at 12/18/23 1748   senna-docusate (Senokot-S) tablet 1 tablet  1 tablet Oral QHS PRN Izell Harari, MD       sodium chloride  flush (NS) 0.9 % injection 5 mL  5 mL Intracatheter Q8H Vanice Sharper, MD   5 mL at 12/19/23 0529   Labs:  CBGs 140s Recent Labs  Lab 12/17/23 0538 12/18/23 0451 12/19/23 0537  WBC 15.0* 10.7* 6.7  HGB 9.0* 8.1* 8.3*  HCT 28.0* 25.3* 25.8*  PLT 395 380 381   Recent Labs  Lab 12/13/23 0415 12/13/23 1740 12/17/23 0538 12/18/23 0451 12/19/23 0537  NA 134*   < > 134* 137 134*  K 3.3*   < > 3.9 3.4* 3.4*  CL 97*   < > 99 99 98  CO2 24   < > 22 24 24   BUN 7   < > 9 6 <5*  CREATININE 1.00   < > 1.05* 0.97 1.01*  CALCIUM  8.7*   < > 8.6* 8.6* 8.5*  PROT 6.3*  --   --   --   --   BILITOT 0.5  --   --   --   --   ALKPHOS 48  --   --   --   --   ALT 24  --   --   --   --   AST 41  --   --   --   --   GLUCOSE 162*   < > 185* 148* 161*   < > = values in this  interval not displayed.   Radiology:  No results found.    Assessment & Plan:  Patient stable and improving very slowly  *GYN: Slow improvement. Plan to remove staples tomorrow (POD#12), will potentially remove LLQ drain tomorrow pending output as well.  PT is following to help with ambulation, recommend rolling walker on discharge. OK for discharge home.  *ID: Afebrile, leukocytosis resolved this AM. Bcx NGF; drain cultures also no growth.  IR drain in place until output < 10cc/d. Has small bump in surgical drain output yesterday, but if continues to have minimal output may remove LLQ drain tomrrow (as above).  Continues on doxy/flagyl  (D7). s/p rocephin  10/13-10/22; cefoxitin x 1 on 10/20; doxy IV 10/13-10/25; flagyl  IV 10/14-10/24; zosyn x 1 on 10/13  *Heme: Transitioned back to Eliquis for DVT/PE Prev d/w vascular surgery and medication management only recommended  *Renal: Small bump in  Cr, but overall stable. Continue trending. Lytes repleted this AM.   *FEN/GI: ADAT- still struggling with PO intake, nausea & vomiting for unclear reason. Saline lock IV. Pepcid . Miralax   *Endo: DM coordinator following. Increased semglee to 10u daily. Appreciate medicine & DM recs  *Pulm: see above. No current issues. Appreciate medicine recs *CV: continue current regimen. Appreciate medicine recs  *Dispo: Hopeful for discharge home this weekend if she is able to tolerate a regular diet. Patient did state she lives in a first floor apartment with minimal stairs and will be able to have someone stay with her whenever she is discharged.   Kieth JAYSON Carolin, MD Attending Center for [facility]) GYN Consult Phone: [phone] (M-F, [phone]) & [phone]  (Off hours, weekends, holidays)

## 2023-12-19 NOTE — Plan of Care (Signed)
  Problem: Education: Goal: Ability to describe self-care measures that may prevent or decrease complications (Diabetes Survival Skills Education) will improve Outcome: Progressing Goal: Individualized Educational Video(s) Outcome: Progressing   Problem: Coping: Goal: Ability to adjust to condition or change in health will improve Outcome: Progressing   Problem: Fluid Volume: Goal: Ability to maintain a balanced intake and output will improve Outcome: Progressing   Problem: Health Behavior/Discharge Planning: Goal: Ability to identify and utilize available resources and services will improve Outcome: Progressing Goal: Ability to manage health-related needs will improve Outcome: Progressing   Problem: Nutritional: Goal: Maintenance of adequate nutrition will improve Outcome: Progressing Goal: Progress toward achieving an optimal weight will improve Outcome: Progressing   

## 2023-12-19 NOTE — Progress Notes (Addendum)
 Patient refused dressing change. Patient educated on importance. Dressings clean, dry and intact   Patient agreed to dressing change. Changed at 0543.

## 2023-12-20 ENCOUNTER — Other Ambulatory Visit (HOSPITAL_COMMUNITY): Payer: Self-pay

## 2023-12-20 LAB — CBC WITH DIFFERENTIAL/PLATELET
Abs Immature Granulocytes: 0.04 K/uL (ref 0.00–0.07)
Basophils Absolute: 0 K/uL (ref 0.0–0.1)
Basophils Relative: 1 %
Eosinophils Absolute: 0.2 K/uL (ref 0.0–0.5)
Eosinophils Relative: 3 %
HCT: 28.2 % — ABNORMAL LOW (ref 36.0–46.0)
Hemoglobin: 9 g/dL — ABNORMAL LOW (ref 12.0–15.0)
Immature Granulocytes: 1 %
Lymphocytes Relative: 25 %
Lymphs Abs: 1.9 K/uL (ref 0.7–4.0)
MCH: 30.7 pg (ref 26.0–34.0)
MCHC: 31.9 g/dL (ref 30.0–36.0)
MCV: 96.2 fL (ref 80.0–100.0)
Monocytes Absolute: 0.8 K/uL (ref 0.1–1.0)
Monocytes Relative: 11 %
Neutro Abs: 4.4 K/uL (ref 1.7–7.7)
Neutrophils Relative %: 59 %
Platelets: 438 K/uL — ABNORMAL HIGH (ref 150–400)
RBC: 2.93 MIL/uL — ABNORMAL LOW (ref 3.87–5.11)
RDW: 14 % (ref 11.5–15.5)
WBC: 7.4 K/uL (ref 4.0–10.5)
nRBC: 0 % (ref 0.0–0.2)

## 2023-12-20 LAB — BASIC METABOLIC PANEL WITH GFR
Anion gap: 12 (ref 5–15)
BUN: 8 mg/dL (ref 6–20)
CO2: 23 mmol/L (ref 22–32)
Calcium: 8.5 mg/dL — ABNORMAL LOW (ref 8.9–10.3)
Chloride: 98 mmol/L (ref 98–111)
Creatinine, Ser: 0.9 mg/dL (ref 0.44–1.00)
GFR, Estimated: >60 mL/min (ref >60)
Glucose, Bld: 144 mg/dL — ABNORMAL HIGH (ref 70–99)
Potassium: 3.6 mmol/L (ref 3.5–5.1)
Sodium: 133 mmol/L — ABNORMAL LOW (ref 135–145)

## 2023-12-20 LAB — GLUCOSE, CAPILLARY
Glucose-Capillary: 143 mg/dL — ABNORMAL HIGH (ref 70–99)
Glucose-Capillary: 132 mg/dL — ABNORMAL HIGH (ref 70–99)
Glucose-Capillary: 161 mg/dL — ABNORMAL HIGH (ref 70–99)
Glucose-Capillary: 156 mg/dL — ABNORMAL HIGH (ref 70–99)

## 2023-12-20 MED ORDER — ACETAMINOPHEN 325 MG PO TABS
650.0000 mg | ORAL_TABLET | Freq: Four times a day (QID) | ORAL | Status: DC | PRN
  Administered 2023-12-20 – 2023-12-22 (×2): 650 mg via ORAL
  Filled 2023-12-20 (×2): qty 2

## 2023-12-20 MED ORDER — METRONIDAZOLE 500 MG PO TABS
500.0000 mg | ORAL_TABLET | Freq: Three times a day (TID) | ORAL | Status: DC
  Administered 2023-12-20 – 2023-12-23 (×7): 500 mg via ORAL
  Filled 2023-12-20 (×9): qty 1

## 2023-12-20 MED ORDER — DOXYCYCLINE HYCLATE 100 MG PO TABS
100.0000 mg | ORAL_TABLET | Freq: Two times a day (BID) | ORAL | Status: DC
  Administered 2023-12-20 – 2023-12-23 (×5): 100 mg via ORAL
  Filled 2023-12-20 (×7): qty 1

## 2023-12-20 MED ORDER — GLYCOPYRROLATE 1 MG PO TABS
2.0000 mg | ORAL_TABLET | Freq: Three times a day (TID) | ORAL | Status: DC | PRN
  Administered 2023-12-20 – 2023-12-23 (×6): 2 mg via ORAL
  Filled 2023-12-20 (×7): qty 2

## 2023-12-20 MED ORDER — DIPHENHYDRAMINE HCL 25 MG PO CAPS
25.0000 mg | ORAL_CAPSULE | Freq: Four times a day (QID) | ORAL | Status: DC | PRN
  Administered 2023-12-22: 25 mg via ORAL
  Filled 2023-12-20: qty 1

## 2023-12-20 MED ORDER — GLYCOPYRROLATE 1 MG PO TABS
2.0000 mg | ORAL_TABLET | Freq: Three times a day (TID) | ORAL | 0 refills | Status: DC | PRN
  Filled 2023-12-20: qty 60, 10d supply, fill #0

## 2023-12-20 MED ORDER — GLYCOPYRROLATE 1 MG PO TABS
1.0000 mg | ORAL_TABLET | Freq: Two times a day (BID) | ORAL | Status: DC
  Administered 2023-12-20 – 2023-12-23 (×6): 1 mg via ORAL
  Filled 2023-12-20 (×6): qty 1

## 2023-12-20 NOTE — Progress Notes (Signed)
 Called to pt. Room for incision bleeding.  Pt. In bathroom and placed back to bed.  Incision opened @ the top.  Dr. Nicholaus notified.  Dr. Nicholaus @ bedside wet to dry dressing applied see  further orders.

## 2023-12-20 NOTE — Plan of Care (Signed)

## 2023-12-20 NOTE — Progress Notes (Signed)
 Daily Gynecology Note  Admission Date: 12/01/2023 Current Date: 12/20/2023 12:44 PM  Hospital Course Misty Walsh is a 33 y.o. 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 Patient started on IV abx and [facility] IR didn't feel able to drain. She was then transferred to [facility] GYN on 10/17 and IR at [facility] didn't feel able to drain, as well.   She was taken to the OR by GYN and Gen Surg on 10/20 and had a ex-lap via vertical midline/LOA/removal of left adnexal cyst wall/washout/JP drain placement x 2 (left and right posterior gutters) and closed with staples. Operative findings significant for extensive adhesions, difficulty identifying GYN anatomy with large amount of sero-sang fluid during surgery; surg path of cyst wall, cultures and gram stain all negative, as well as final admit blood cultures.   Post op, patient diagnosed with bilateral LE DVTs and PE and started on anti-coagulation.    She also had drainage of another collection by IR on 10/29, and has a transgluteal drain in place.  Overnight/24hr events:  No acute events overnight  Subjective:  Doing OK this morning. IV access was lost (stopped working). She is trying to see if she can tolerate all PO medications. Since stopping prn robinul , she has had a lot of spitting that is really bothering her.  Pain control yesterday was inadequate, but open to seeing how things go today with PO meds.   Objective:    Current Vital Signs 24h Vital Sign Ranges  T 98.4 F (36.9 C) Temp  Avg: 98.1 F (36.7 C)  Min: 97.9 F (36.6 C)  Max: 98.4 F (36.9 C)  BP (!) 148/81 Rick, RN present at bedside) BP  Min: 139/60  Max: 162/68  HR (!) 102 Pulse  Avg: 103.2  Min: 100  Max: 109  RR 19 Resp  Avg: 17.8  Min: 16  Max: 19  SaO2 96 % Room Air SpO2  Avg: 96.8 %  Min: 95 %  Max: 99 %       24 Hour I/O Current Shift I/O  Time Ins Outs 10/31 0701  - 11/01 0700 In: 5  Out: 220 [Urine:200; Drains:20] No intake/output data recorded.   Patient Vitals for the past 24 hrs:  BP Temp Temp src Pulse Resp SpO2  12/20/23 0945 (!) 148/81 98.4 F (36.9 C) Oral (!) 102 19 96 %  12/20/23 0852 -- -- -- -- 16 --  12/20/23 0454 (!) 148/73 98 F (36.7 C) Oral (!) 103 18 96 %  12/19/23 2338 (!) 142/79 97.9 F (36.6 C) Oral (!) 109 18 99 %  12/19/23 2030 (!) 154/77 -- -- 100 -- --  12/19/23 1947 (!) 162/68 98.1 F (36.7 C) Oral (!) 103 18 95 %  12/19/23 1555 139/60 98.1 F (36.7 C) Oral (!) 102 18 98 %   Transgluteal drain minimal sero-sang fluid in it, bulb on suction.  Physical exam: General: Well nourished, well developed female in no acute distress. Abdomen: obese; Minimal output of serosanguinous fluid in surgical drains. dressings, staples & abdominal/surgical drains were all removed. Vertical midline incision with some superficial separation, but this does not probe deeper, no e/o abscess/seroma/hematoma. Steri strips placed over VML and drain sites.   Cardiovascular: normal rate Respiratory: normal effort  Medications: Current Facility-Administered Medications  Medication Dose Route Frequency Provider Last Rate Last Admin   acetaminophen  (TYLENOL ) tablet 650 mg  650 mg  Oral Q6H PRN Rojelio Nest, DO       alum & mag hydroxide-simeth (MAALOX/MYLANTA) 200-200-20 MG/5ML suspension 30 mL  30 mL Oral Q4H PRN Jadine Toribio SQUIBB, MD   30 mL at 12/03/23 0321   amLODipine (NORVASC) tablet 5 mg  5 mg Oral Daily Al-Sultani, Anmar, MD   5 mg at 12/20/23 1145   apixaban (ELIQUIS) tablet 10 mg  10 mg Oral BID Zina Jerilynn LABOR, MD   10 mg at 12/20/23 1147   Followed by   NOREEN ON 12/25/2023] apixaban (ELIQUIS) tablet 5 mg  5 mg Oral BID Zina Jerilynn LABOR, MD       atorvastatin (LIPITOR) tablet 80 mg  80 mg Oral Daily Lue Elsie BROCKS, MD   80 mg at 12/20/23 1146   chlorthalidone  (HYGROTON ) tablet 25 mg  25 mg Oral Daily Lue Elsie BROCKS, MD    25 mg at 12/20/23 1148   diphenhydrAMINE  (BENADRYL ) capsule 25 mg  25 mg Oral Q6H PRN Rojelio Nest, DO       doxycycline  (VIBRA -TABS) tablet 100 mg  100 mg Oral Q12H Rojelio Nest, DO       gabapentin (NEURONTIN) capsule 300 mg  300 mg Oral TID Ozan, Jennifer, DO   300 mg at 12/20/23 1144   glycopyrrolate  (ROBINUL ) tablet 1 mg  1 mg Oral BID Rojelio Nest, DO   1 mg at 12/20/23 1145   glycopyrrolate  (ROBINUL ) tablet 2 mg  2 mg Oral TID PRN Erik Kieth BROCKS, MD       insulin  aspart (novoLOG) injection 0-15 Units  0-15 Units Subcutaneous TID WC Al-Sultani, Anmar, MD   2 Units at 12/20/23 0930   insulin  aspart (novoLOG) injection 0-5 Units  0-5 Units Subcutaneous QHS Al-Sultani, Anmar, MD   2 Units at 12/18/23 2120   insulin  glargine-yfgn (SEMGLEE) injection 10 Units  10 Units Subcutaneous Daily Erik Kieth BROCKS, MD   10 Units at 12/20/23 1142   loperamide (IMODIUM) capsule 2 mg  2 mg Oral PRN Arlon Carliss ORN, DO   2 mg at 12/08/23 0951   losartan (COZAAR) tablet 50 mg  50 mg Oral Daily Al-Sultani, Anmar, MD   50 mg at 12/20/23 1145   menthol  (CEPACOL) lozenge 3 mg  1 lozenge Oral PRN Ozan, Jennifer, DO   3 mg at 12/10/23 2053   metoCLOPramide  (REGLAN ) tablet 5 mg  5 mg Oral Q8H Zina Jerilynn LABOR, MD   5 mg at 12/19/23 1451   metroNIDAZOLE  (FLAGYL ) tablet 500 mg  500 mg Oral Q8H Rojelio Nest, DO       ondansetron  (ZOFRAN ) injection 4 mg  4 mg Intravenous Q6H PRN Anyanwu, Ugonna A, MD   4 mg at 12/19/23 1104   Or   ondansetron  (ZOFRAN ) tablet 4 mg  4 mg Oral Q6H PRN Anyanwu, Ugonna A, MD   4 mg at 12/20/23 9077   Oral care mouth rinse  15 mL Mouth Rinse PRN Jadine Toribio SQUIBB, MD       oxyCODONE  (Oxy IR/ROXICODONE ) immediate release tablet 5-10 mg  5-10 mg Oral Q4H PRN Lue Elsie BROCKS, MD   10 mg at 12/20/23 0927   polyethylene glycol (MIRALAX  / GLYCOLAX ) packet 17 g  17 g Oral QODAY Pickens, Charlie, MD   17 g at 12/20/23 1146   promethazine  (PHENERGAN ) tablet 25 mg  25 mg Oral Q6H  PRN Zina Jerilynn LABOR, MD   25 mg at 12/20/23 1140   scopolamine  (TRANSDERM-SCOP) 1 MG/3DAYS 1 mg  1  patch Transdermal Q72H Zina Jerilynn LABOR, MD   1 mg at 12/18/23 1748   senna-docusate (Senokot-S) tablet 1 tablet  1 tablet Oral QHS PRN Izell Harari, MD       Labs:  CBGs 140s Recent Labs  Lab 12/18/23 0451 12/19/23 0537 12/20/23 0908  WBC 10.7* 6.7 7.4  HGB 8.1* 8.3* 9.0*  HCT 25.3* 25.8* 28.2*  PLT 380 381 438*   Recent Labs  Lab 12/18/23 0451 12/19/23 0537 12/20/23 0908  NA 137 134* 133*  K 3.4* 3.4* 3.6  CL 99 98 98  CO2 24 24 23   BUN 6 <5* 8  CREATININE 0.97 1.01* 0.90  CALCIUM  8.6* 8.5* 8.5*  GLUCOSE 148* 161* 144*   Radiology:  No results found.    Assessment & Plan:  Patient stable. Improving slowly and moving towards discharge  *GYN: Staples and surgical drains removed this morning. Steris placed over incision to address small areas of superficial separation. PT is following to help with ambulation, recommend rolling walker on discharge. OK for discharge home from PT standpoint once medically appropriate.  *ID: Afebrile, leukocytosis resolved this AM. Bcx NGF; drain cultures also no growth.  IR drain in place until output < 10cc/d. IR has tentatively planned outpatient follow up and removal  Continues on doxy/flagyl  (D8). s/p rocephin  10/13-10/22; cefoxitin x 1 on 10/20; doxy IV 10/13-10/25; flagyl  IV 10/14-10/24; zosyn x 1 on 10/13 Planning 14 day course of antibiotics  *Heme: Transitioned back to Eliquis for DVT/PE Prev d/w vascular surgery and medication management only recommended  *Renal: Cr normal this AM. Continue trending.    *FEN/GI: ADAT. Struggling mostly with PO intake. If she can tolerate all PO medications and PO intake today, hopeful for discharge tomorrow. Pepcid . Miralax   *Endo: DM coordinator following. Increased semglee to 10u daily. Appreciate medicine & DM recs  *Pulm: see above. No current issues. Appreciate medicine recs *CV:  continue current regimen. Appreciate medicine recs  *Dispo: Hopeful for discharge home tomorrow if she is able to tolerate a regular diet. Patient did state she lives in a first floor apartment with minimal stairs and will be able to have someone stay with her whenever she is discharged.   Kieth JAYSON Carolin, MD Attending Center for [facility]) GYN Consult Phone: [phone] (M-F, [phone]) & [phone]  (Off hours, weekends, holidays)

## 2023-12-20 NOTE — Progress Notes (Signed)
 PROGRESS NOTE    Misty Walsh  FMW:[id number] DOB: Feb 16, 1991 DOA: 12/01/2023 PCP: Buck Search, PA-C     Brief Narrative:  Misty Walsh is a 33 year old woman PMH including diabetes, presented with abdominal pain nausea and dry heaving, presented with sepsis and huge suspected tubo-ovarian abscess.  Transferred from [facility] to [facility] Main [facility]. GYN was consulted. She underwent ex lap and drainage of tubo-ovarian abscess/pelvic abscess by Dr. Vanderbilt and Dr. Ozan 10/20. Post-operatively, she was found to have PE and DVT and was started on anticoagulation. Repeat CT showed new pelvic abscesses and IR was consulted for drainage.   New events last 24 hours / Subjective: Getting her drains removed today. Admits to nausea. Has been able to tolerate PO medications.   Assessment & Plan:  Principal Problem:   Sepsis (HCC) Active Problems:   Type 2 diabetes mellitus without complication, without long-term current use of insulin  (HCC)   Tubo-ovarian abscess   BMI 50.0-59.9, adult (HCC)   Obesity, class 3 (HCC)   AKI (acute kidney injury)   Acute hypoxic respiratory failure (HCC)   S/P exploratory laparotomy   #Acute PE #Acute bilateral DVTs - Noted to be tachypneic with worsening dyspnea.  Found to have acute PE. - CTA chest (12/10/2023) showed PE over the proximal right upper/middle lobe arteries and subsegmental left lower lobar artery with no evidence of right heart strain. - Etiology unclear, likely multifactorial given significant immobility, obesity, acute illness - Bilateral LE duplex showed acute bilateral DVTs with proximal involvement on the right - Vascular surgery consulted due to proximal right DVT - spoke to Dr. Magda and Dr. Pearline who indicated no surgical intervention at this time, only anticoagulation - Continue eliquis  - Recommend at least 3 months of anticoagulation for likely provoked DVT given significantly reduced mobility. May benefit from referral to  outpatient hematology for further delineation of appropriate length of anticoagulation   #Acute hypoxic respiratory failure - resolved - Noted to be tachypneic with worsening dyspnea and was ultimately found to have acute PE, possibly complicated further by OHS - On room air   #Sepsis secondary to large tubo-ovarian abscess - Presented with fever, leukocytosis, noted to have large tubo-ovarian abscess - Blood cultures (10/13) negative - Completed 5 days of rocephin  - On doxycycline  and Flagyl  for TOA as below - WBC improving   # Large tubo-ovarian abscess status post ex lap w/ washout 10/20 - Surgical pathology showed benign cyst wall with cyst lining dilatation.  Negative for malignancy - Completed 5 days of Rocephin  - On doxycycline  and Flagyl   - Encouraged OOB - Incentive spirometry - CTA/P with large pelvic cul-de-sac abscess (approximately 11 x 5 x 4.8 cm) with surrounding formation and small adjacent satellite collections: Additional RLQ collection (4 cm) near the abdominal wall possibly abscess versus postop fluid.  Mild bilateral hydroureteronephrosis likely from pelvic inflammation, bladder markedly distended with some air (postcatheterization versus emphysematous cystitis).  Postop anterior abdominal wall changes with small superficial air-fluid collections (seroma/hematoma versus superficial abscess).  Sigmoid wall thickening likely reactive. - Further management per OBGYN - IR placed drain 10/29.  Culture negative to date    # Refractory nausea and vomiting # Refractory excessive oral secretions - Reports persistent nausea and vomiting as well as excessive oral secretions, not responsive to current therapeutics - Currently on Zofran , Reglan , Phenergan , Scopolamine  patch, robinul  for secretions    # Constipation - Bowel regimen    # AKI - resolved - Etiology likely multifactorial - initially  decreased PO intake and sepsis, now post IV contrast exposure and Lasix  -  Baseline Cr ~ 0.9.  - Stable    #Hypertension - Continue chlorthalidone  25 mg daily, Losartan to 50 mg daily, and amlodipine 5 mg daily   # Bilateral lower extremity swelling, R > L - Patient reports her legs are more swollen than normal, initially thought due to fluid overload and was receiving Lasix, without much improvement - However, now found to have acute bilateral DVTs, likely the main driver of her BLE swelling - Will continue to monitor and hold off additional Lasix at this time   # Right breast mass - Incidental finding of 2.5 cm oval mass with few coarse calcifications over the medial right breast was noted on CTA chest - Recommend follow-up with PCP or OB/GYN outpatient diagnostic mammography   #T2DM - Hgb A1c 6.3 (12/02/2023), well-controlled at home - no dispense history for glycemic control agents - Received dexamethasone  intraoperatively and had postop hyperglycemia - Semglee and SSI    # HLD - Lipitor    #Debility - PT/OT following   #Class III / Morbid Obesity (BMI Body mass index is 51.49 kg/m. kg/m)    DVT prophylaxis: Eliquis apixaban (ELIQUIS) tablet 10 mg  apixaban (ELIQUIS) tablet 5 mg  Code Status: Full Family Communication: None at bedside     Antimicrobials:  Anti-infectives (From admission, onward)    Start     Dose/Rate Route Frequency Ordered Stop   12/20/23 1400  metroNIDAZOLE  (FLAGYL ) tablet 500 mg        500 mg Oral Every 8 hours 12/20/23 1008     12/20/23 1100  doxycycline  (VIBRA -TABS) tablet 100 mg        100 mg Oral Every 12 hours 12/20/23 1008     12/16/23 1600  metroNIDAZOLE  (FLAGYL ) IVPB 500 mg  Status:  Discontinued        500 mg 100 mL/hr over 60 Minutes Intravenous Every 12 hours 12/16/23 1508 12/20/23 1008   12/16/23 1530  doxycycline  (VIBRAMYCIN ) 100 mg in sodium chloride  0.9 % 250 mL IVPB  Status:  Discontinued        100 mg 125 mL/hr over 120 Minutes Intravenous Every 12 hours 12/16/23 1508 12/20/23 1008   12/13/23  2200  doxycycline  (VIBRA -TABS) tablet 100 mg  Status:  Discontinued        100 mg Oral Every 12 hours 12/13/23 1059 12/16/23 1508   12/13/23 1145  metroNIDAZOLE  (FLAGYL ) tablet 500 mg  Status:  Discontinued        500 mg Oral Every 12 hours 12/13/23 1059 12/16/23 1508   12/13/23 0000  doxycycline  (VIBRA -TABS) 100 MG tablet        100 mg Oral Every 12 hours 12/13/23 1420 12/27/23 2359   12/13/23 0000  metroNIDAZOLE  (FLAGYL ) 500 MG tablet        500 mg Oral Every 12 hours 12/13/23 1420 12/27/23 2359   12/08/23 0845  cefOXitin (MEFOXIN) 2 g in sodium chloride  0.9 % 100 mL IVPB        2 g 200 mL/hr over 30 Minutes Intravenous On call to O.R. 12/08/23 0758 12/08/23 1424   12/06/23 2000  cefTRIAXone  (ROCEPHIN ) 2 g in sodium chloride  0.9 % 100 mL IVPB        2 g 200 mL/hr over 30 Minutes Intravenous Every 24 hours 12/06/23 0723 12/10/23 2142   12/02/23 1800  doxycycline  (VIBRAMYCIN ) 100 mg in sodium chloride  0.9 % 250 mL IVPB  Status:  Discontinued        100 mg 125 mL/hr over 120 Minutes Intravenous Every 12 hours 12/02/23 1736 12/13/23 1059   12/02/23 1800  metroNIDAZOLE  (FLAGYL ) IVPB 500 mg  Status:  Discontinued        500 mg 100 mL/hr over 60 Minutes Intravenous Every 12 hours 12/02/23 1736 12/13/23 1059   12/01/23 2000  cefTRIAXone  (ROCEPHIN ) 2 g in sodium chloride  0.9 % 100 mL IVPB        2 g 200 mL/hr over 30 Minutes Intravenous Every 24 hours 12/01/23 1920 12/05/23 2052   12/01/23 1300  piperacillin-tazobactam (ZOSYN) IVPB 3.375 g        3.375 g 100 mL/hr over 30 Minutes Intravenous  Once 12/01/23 1259 12/01/23 1420        Objective: Vitals:   12/19/23 2338 12/20/23 0454 12/20/23 0852 12/20/23 0945  BP: (!) 142/79 (!) 148/73  (!) 148/81  Pulse: (!) 109 (!) 103  (!) 102  Resp: 18 18 16 19   Temp: 97.9 F (36.6 C) 98 F (36.7 C)  98.4 F (36.9 C)  TempSrc: Oral Oral  Oral  SpO2: 99% 96%  96%  Weight:      Height:        Intake/Output Summary (Last 24 hours) at 12/20/2023  1236 Last data filed at 12/20/2023 0200 Gross per 24 hour  Intake 5 ml  Output 220 ml  Net -215 ml   Filed Weights   12/01/23 1243 12/08/23 1246  Weight: 136.1 kg 136.1 kg   Examination: General exam: Appears calm  Respiratory system: Clear to auscultation. Respiratory effort normal.  On room air Cardiovascular system: S1 & S2 heard. RR. No pedal edema. Gastrointestinal system: Abdomen is nondistended, soft Central nervous system: Alert and oriented. Non focal exam. Speech clear  Extremities: Symmetric in appearance bilaterally  Psychiatry: Judgement and insight appear stable. Mood & affect appropriate.    Data Reviewed: I have personally reviewed following labs and imaging studies  CBC: Recent Labs  Lab 12/16/23 0849 12/17/23 0538 12/18/23 0451 12/19/23 0537 12/20/23 0908  WBC 14.0* 15.0* 10.7* 6.7 7.4  NEUTROABS  --   --   --   --  4.4  HGB 9.2* 9.0* 8.1* 8.3* 9.0*  HCT 27.8* 28.0* 25.3* 25.8* 28.2*  MCV 94.6 95.2 95.1 95.6 96.2  PLT 415* 395 380 381 438*   Basic Metabolic Panel: Recent Labs  Lab 12/16/23 0849 12/17/23 0538 12/18/23 0451 12/19/23 0537 12/20/23 0908  NA 134* 134* 137 134* 133*  K 3.4* 3.9 3.4* 3.4* 3.6  CL 97* 99 99 98 98  CO2 25 22 24 24 23   GLUCOSE 154* 185* 148* 161* 144*  BUN 9 9 6  <5* 8  CREATININE 1.01* 1.05* 0.97 1.01* 0.90  CALCIUM  8.4* 8.6* 8.6* 8.5* 8.5*  MG  --  1.8  --  1.6*  --    GFR: Estimated Creatinine Clearance: 121.4 mL/min (by C-G formula based on SCr of 0.9 mg/dL). Liver Function Tests: No results for input(s): AST, ALT, ALKPHOS, BILITOT, PROT, ALBUMIN in the last 168 hours.  No results for input(s): LIPASE, AMYLASE in the last 168 hours. No results for input(s): AMMONIA in the last 168 hours. Coagulation Profile: Recent Labs  Lab 12/16/23 0849 12/17/23 0538  INR 1.3* 1.2   Cardiac Enzymes: No results for input(s): CKTOTAL, CKMB, CKMBINDEX, TROPONINI in the last 168 hours. BNP (last  3 results) No results for input(s): PROBNP in the last 8760 hours. HbA1C: No results for  input(s): HGBA1C in the last 72 hours. CBG: Recent Labs  Lab 12/19/23 0936 12/19/23 1256 12/19/23 1648 12/19/23 2334 12/20/23 0916  GLUCAP 136* 173* 154* 200* 143*   Lipid Profile: No results for input(s): CHOL, HDL, LDLCALC, TRIG, CHOLHDL, LDLDIRECT in the last 72 hours. Thyroid Function Tests: No results for input(s): TSH, T4TOTAL, FREET4, T3FREE, THYROIDAB in the last 72 hours. Anemia Panel: No results for input(s): VITAMINB12, FOLATE, FERRITIN, TIBC, IRON, RETICCTPCT in the last 72 hours. Sepsis Labs: No results for input(s): PROCALCITON, LATICACIDVEN in the last 168 hours.  Recent Results (from the past 240 hours)  Aerobic/Anaerobic Culture w Gram Stain (surgical/deep wound)     Status: None (Preliminary result)   Collection Time: 12/17/23  9:51 AM   Specimen: Abscess  Result Value Ref Range Status   Specimen Description ABSCESS  Final   Special Requests PELVIC,RIGHT  Final   Gram Stain   Final    ABUNDANT WBC PRESENT, PREDOMINANTLY PMN NO ORGANISMS SEEN    Culture   Final    NO GROWTH 3 DAYS NO ANAEROBES ISOLATED; CULTURE IN PROGRESS FOR 5 DAYS Performed at [facility], [address], [place], [place] [address]    Report Status PENDING  Incomplete      Radiology Studies: No results found.     Scheduled Meds:  amLODipine  5 mg Oral Daily   apixaban  10 mg Oral BID   Followed by   NOREEN ON 12/25/2023] apixaban  5 mg Oral BID   atorvastatin  80 mg Oral Daily   chlorthalidone   25 mg Oral Daily   doxycycline   100 mg Oral Q12H   gabapentin  300 mg Oral TID   glycopyrrolate   1 mg Oral BID   insulin  aspart  0-15 Units Subcutaneous TID WC   insulin  aspart  0-5 Units Subcutaneous QHS   insulin  glargine-yfgn  10 Units Subcutaneous Daily   losartan  50 mg Oral Daily   metoCLOPramide   5 mg Oral Q8H   metroNIDAZOLE   500 mg  Oral Q8H   polyethylene glycol  17 g Oral QODAY   scopolamine   1 patch Transdermal Q72H   Continuous Infusions:     LOS: 19 days   Time spent: 25 minutes   Delon Hoe, DO [facility] 12/20/2023, 12:36 PM   Available via Epic secure chat 7am-7pm After these hours, please refer to coverage provider listed on [email or web address]

## 2023-12-20 NOTE — Progress Notes (Signed)
 Down to see patient per RN request for separation of wound. Patient with infraumbilical vertical laparotomy incision that has separated along the top half after staples removed today. As deep as 1 cm along separation, ~half less deep than 1 cm, does not appear infected, when probed, adipose tissue intact and unable to further separate. Good blood flow and tissue appears healthy though patient did not tolerate aggressive debridement. Repacked with wet to dry dressing and will have patient be seen by wound team in am for possible wound vac placement vs continuing wet to dry. Patient verbalizes understanding of all of the above and is in agreement with plan.  LOIS Yolanda Moats, MD, [facility] Attending Center for [employer] [facility])

## 2023-12-21 ENCOUNTER — Other Ambulatory Visit (HOSPITAL_COMMUNITY): Payer: Self-pay

## 2023-12-21 ENCOUNTER — Inpatient Hospital Stay (HOSPITAL_COMMUNITY): Payer: MEDICAID

## 2023-12-21 LAB — CBC WITH DIFFERENTIAL/PLATELET
Abs Immature Granulocytes: 0.07 K/uL (ref 0.00–0.07)
Basophils Absolute: 0 K/uL (ref 0.0–0.1)
Basophils Relative: 1 %
Eosinophils Absolute: 0.3 K/uL (ref 0.0–0.5)
Eosinophils Relative: 3 %
HCT: 27.2 % — ABNORMAL LOW (ref 36.0–46.0)
Hemoglobin: 8.8 g/dL — ABNORMAL LOW (ref 12.0–15.0)
Immature Granulocytes: 1 %
Lymphocytes Relative: 24 %
Lymphs Abs: 1.9 K/uL (ref 0.7–4.0)
MCH: 31 pg (ref 26.0–34.0)
MCHC: 32.4 g/dL (ref 30.0–36.0)
MCV: 95.8 fL (ref 80.0–100.0)
Monocytes Absolute: 1 K/uL (ref 0.1–1.0)
Monocytes Relative: 13 %
Neutro Abs: 4.5 K/uL (ref 1.7–7.7)
Neutrophils Relative %: 58 %
Platelets: 405 K/uL — ABNORMAL HIGH (ref 150–400)
RBC: 2.84 MIL/uL — ABNORMAL LOW (ref 3.87–5.11)
RDW: 13.9 % (ref 11.5–15.5)
WBC: 7.7 K/uL (ref 4.0–10.5)
nRBC: 0 % (ref 0.0–0.2)

## 2023-12-21 LAB — BASIC METABOLIC PANEL WITH GFR
Anion gap: 12 (ref 5–15)
BUN: 8 mg/dL (ref 6–20)
CO2: 23 mmol/L (ref 22–32)
Calcium: 8.6 mg/dL — ABNORMAL LOW (ref 8.9–10.3)
Chloride: 97 mmol/L — ABNORMAL LOW (ref 98–111)
Creatinine, Ser: 0.93 mg/dL (ref 0.44–1.00)
GFR, Estimated: >60 mL/min (ref >60)
Glucose, Bld: 126 mg/dL — ABNORMAL HIGH (ref 70–99)
Potassium: 3.7 mmol/L (ref 3.5–5.1)
Sodium: 132 mmol/L — ABNORMAL LOW (ref 135–145)

## 2023-12-21 LAB — HEPATIC FUNCTION PANEL
ALT: 19 U/L (ref 0–44)
AST: 28 U/L (ref 15–41)
Albumin: 2.4 g/dL — ABNORMAL LOW (ref 3.5–5.0)
Alkaline Phosphatase: 44 U/L (ref 38–126)
Bilirubin, Direct: <0.1 mg/dL (ref 0.0–0.2)
Total Bilirubin: 0.2 mg/dL (ref 0.0–1.2)
Total Protein: 6.5 g/dL (ref 6.5–8.1)

## 2023-12-21 LAB — GLUCOSE, CAPILLARY
Glucose-Capillary: 114 mg/dL — ABNORMAL HIGH (ref 70–99)
Glucose-Capillary: 122 mg/dL — ABNORMAL HIGH (ref 70–99)
Glucose-Capillary: 147 mg/dL — ABNORMAL HIGH (ref 70–99)

## 2023-12-21 MED ORDER — PROMETHAZINE HCL 25 MG RE SUPP
25.0000 mg | Freq: Four times a day (QID) | RECTAL | Status: DC | PRN
  Administered 2023-12-22: 25 mg via RECTAL
  Filled 2023-12-21 (×2): qty 1

## 2023-12-21 MED ORDER — ONDANSETRON 4 MG PO TBDP
8.0000 mg | ORAL_TABLET | Freq: Three times a day (TID) | ORAL | Status: DC | PRN
  Filled 2023-12-21 (×3): qty 2

## 2023-12-21 NOTE — Consult Note (Signed)
 Patient underwent exp lap w/removal of L cyst wall 12/08/2023 Dr. Ozan and Dr. Vanderbilt. Incision has dehisced per notes and WOC consult was placed to evaluate for NPWT.  No WOC RN on campus until Monday 12/22/2023.  Wet to dry dressing being utilized at this time.   WOC team will plan to assess Monday when on campus. Secure chat sent to primary team regarding above.   Thank you,    Doroteo Nickolson MSN, RN-BC, CWOCN

## 2023-12-21 NOTE — Plan of Care (Signed)

## 2023-12-21 NOTE — Progress Notes (Signed)
 PROGRESS NOTE    ODALIZ MCQUEARY  FMW:[id number] DOB: Jul 13, 1990 DOA: 12/01/2023 PCP: Buck Search, PA-C     Brief Narrative:  Misty Walsh is a 33 year old woman PMH including diabetes, presented with abdominal pain nausea and dry heaving, presented with sepsis and huge suspected tubo-ovarian abscess.  Transferred from [facility] to [facility] Main [facility]. GYN was consulted. She underwent ex lap and drainage of tubo-ovarian abscess/pelvic abscess by Dr. Vanderbilt and Dr. Ozan 10/20. Post-operatively, she was found to have PE and DVT and was started on anticoagulation. Repeat CT showed new pelvic abscesses and IR was consulted for drainage. Drains removed 11/1.   New events last 24 hours / Subjective: Last night, had separation of her infraumbilical incision.  Wound team consulted.  This morning, she tells me that she was able to tolerate her entire lunch including sandwich and mashed potatoes yesterday.  However, continues to experience nausea and increase in saliva production.  Also some right upper quadrant abdominal pain to palpation.  Assessment & Plan:  Principal Problem:   Sepsis (HCC) Active Problems:   Type 2 diabetes mellitus without complication, without long-term current use of insulin  (HCC)   Tubo-ovarian abscess   BMI 50.0-59.9, adult (HCC)   Obesity, class 3 (HCC)   AKI (acute kidney injury)   Acute hypoxic respiratory failure (HCC)   S/P exploratory laparotomy   #Acute PE #Acute bilateral DVTs - Noted to be tachypneic with worsening dyspnea.  Found to have acute PE. - CTA chest (12/10/2023) showed PE over the proximal right upper/middle lobe arteries and subsegmental left lower lobar artery with no evidence of right heart strain. - Etiology unclear, likely multifactorial given significant immobility, obesity, acute illness - Bilateral LE duplex showed acute bilateral DVTs with proximal involvement on the right - Vascular surgery consulted due to proximal right DVT - spoke to  Dr. Magda and Dr. Pearline who indicated no surgical intervention at this time, only anticoagulation - Continue eliquis  - Recommend at least 3 months of anticoagulation for likely provoked DVT given significantly reduced mobility. May benefit from referral to outpatient hematology for further delineation of appropriate length of anticoagulation   #Acute hypoxic respiratory failure - resolved - Noted to be tachypneic with worsening dyspnea and was ultimately found to have acute PE, possibly complicated further by OHS - On room air   #Sepsis secondary to large tubo-ovarian abscess - Presented with fever, leukocytosis, noted to have large tubo-ovarian abscess - Blood cultures (10/13) negative - Completed 5 days of rocephin  - On doxycycline  and Flagyl  for TOA as below - WBC improved    # Large tubo-ovarian abscess status post ex lap w/ washout 10/20 - Surgical pathology showed benign cyst wall with cyst lining dilatation.  Negative for malignancy - Completed 5 days of Rocephin  - On doxycycline  and Flagyl   - Encouraged OOB - Incentive spirometry - CTA/P with large pelvic cul-de-sac abscess (approximately 11 x 5 x 4.8 cm) with surrounding formation and small adjacent satellite collections: Additional RLQ collection (4 cm) near the abdominal wall possibly abscess versus postop fluid.  Mild bilateral hydroureteronephrosis likely from pelvic inflammation, bladder markedly distended with some air (postcatheterization versus emphysematous cystitis).  Postop anterior abdominal wall changes with small superficial air-fluid collections (seroma/hematoma versus superficial abscess).  Sigmoid wall thickening likely reactive. - Further management per OBGYN - IR placed drain 10/29.  Culture negative to date    # Refractory nausea and vomiting # Refractory excessive oral secretions - Reports persistent nausea and vomiting as  well as excessive oral secretions, not responsive to current therapeutics -  Currently on Zofran , Reglan , Phenergan , Scopolamine  patch, robinul  for secretions  - Ordered LFT and right upper quadrant ultrasound due to continued nausea and right upper quadrant tenderness to palpation   # Constipation - Bowel regimen    # AKI - resolved - Etiology likely multifactorial - initially decreased PO intake and sepsis, now post IV contrast exposure and Lasix  - Baseline Cr ~ 0.9.  - Stable    #Hypertension - Continue chlorthalidone  25 mg daily, Losartan to 50 mg daily, and amlodipine 5 mg daily   # Bilateral lower extremity swelling, R > L - Patient reports her legs are more swollen than normal, initially thought due to fluid overload and was receiving Lasix, without much improvement - However, now found to have acute bilateral DVTs, likely the main driver of her BLE swelling - Will continue to monitor and hold off additional Lasix at this time   # Right breast mass - Incidental finding of 2.5 cm oval mass with few coarse calcifications over the medial right breast was noted on CTA chest - Recommend follow-up with PCP or OB/GYN outpatient diagnostic mammography   #T2DM - Hgb A1c 6.3 (12/02/2023), well-controlled at home - no dispense history for glycemic control agents - Received dexamethasone  intraoperatively and had postop hyperglycemia - Semglee and SSI    # HLD - Lipitor    #Debility - PT/OT following   #Class III / Morbid Obesity (BMI Body mass index is 51.49 kg/m. kg/m)    DVT prophylaxis: apixaban (ELIQUIS) tablet 10 mg  apixaban (ELIQUIS) tablet 5 mg  Code Status: Full Family Communication: None at bedside     Antimicrobials:  Anti-infectives (From admission, onward)    Start     Dose/Rate Route Frequency Ordered Stop   12/20/23 1400  metroNIDAZOLE  (FLAGYL ) tablet 500 mg        500 mg Oral Every 8 hours 12/20/23 1008     12/20/23 1100  doxycycline  (VIBRA -TABS) tablet 100 mg        100 mg Oral Every 12 hours 12/20/23 1008     12/16/23 1600   metroNIDAZOLE  (FLAGYL ) IVPB 500 mg  Status:  Discontinued        500 mg 100 mL/hr over 60 Minutes Intravenous Every 12 hours 12/16/23 1508 12/20/23 1008   12/16/23 1530  doxycycline  (VIBRAMYCIN ) 100 mg in sodium chloride  0.9 % 250 mL IVPB  Status:  Discontinued        100 mg 125 mL/hr over 120 Minutes Intravenous Every 12 hours 12/16/23 1508 12/20/23 1008   12/13/23 2200  doxycycline  (VIBRA -TABS) tablet 100 mg  Status:  Discontinued        100 mg Oral Every 12 hours 12/13/23 1059 12/16/23 1508   12/13/23 1145  metroNIDAZOLE  (FLAGYL ) tablet 500 mg  Status:  Discontinued        500 mg Oral Every 12 hours 12/13/23 1059 12/16/23 1508   12/13/23 0000  doxycycline  (VIBRA -TABS) 100 MG tablet        100 mg Oral Every 12 hours 12/13/23 1420 12/27/23 2359   12/13/23 0000  metroNIDAZOLE  (FLAGYL ) 500 MG tablet        500 mg Oral Every 12 hours 12/13/23 1420 12/27/23 2359   12/08/23 0845  cefOXitin (MEFOXIN) 2 g in sodium chloride  0.9 % 100 mL IVPB        2 g 200 mL/hr over 30 Minutes Intravenous On call to O.R. 12/08/23 9241  12/08/23 1424   12/06/23 2000  cefTRIAXone  (ROCEPHIN ) 2 g in sodium chloride  0.9 % 100 mL IVPB        2 g 200 mL/hr over 30 Minutes Intravenous Every 24 hours 12/06/23 0723 12/10/23 2142   12/02/23 1800  doxycycline  (VIBRAMYCIN ) 100 mg in sodium chloride  0.9 % 250 mL IVPB  Status:  Discontinued        100 mg 125 mL/hr over 120 Minutes Intravenous Every 12 hours 12/02/23 1736 12/13/23 1059   12/02/23 1800  metroNIDAZOLE  (FLAGYL ) IVPB 500 mg  Status:  Discontinued        500 mg 100 mL/hr over 60 Minutes Intravenous Every 12 hours 12/02/23 1736 12/13/23 1059   12/01/23 2000  cefTRIAXone  (ROCEPHIN ) 2 g in sodium chloride  0.9 % 100 mL IVPB        2 g 200 mL/hr over 30 Minutes Intravenous Every 24 hours 12/01/23 1920 12/05/23 2052   12/01/23 1300  piperacillin-tazobactam (ZOSYN) IVPB 3.375 g        3.375 g 100 mL/hr over 30 Minutes Intravenous  Once 12/01/23 1259 12/01/23 1420         Objective: Vitals:   12/20/23 1950 12/21/23 0037 12/21/23 0424 12/21/23 0845  BP: (!) 130/92 122/73 125/75 126/82  Pulse: 98 100 (!) 102 97  Resp: 18 19 18 18   Temp: 98.2 F (36.8 C) 98.6 F (37 C) 98 F (36.7 C) 98.2 F (36.8 C)  TempSrc: Oral Oral Oral Oral  SpO2: 100% 100% 98% 97%  Weight:      Height:        Intake/Output Summary (Last 24 hours) at 12/21/2023 0932 Last data filed at 12/21/2023 0431 Gross per 24 hour  Intake 10 ml  Output 13 ml  Net -3 ml   Filed Weights   12/01/23 1243 12/08/23 1246  Weight: 136.1 kg 136.1 kg   Examination: General exam: Appears calm  Respiratory system: Clear to auscultation. Respiratory effort normal.  On room air Cardiovascular system: S1 & S2 heard. RR. No pedal edema. Gastrointestinal system: Abdomen is nondistended, soft, TTP RUQ  Central nervous system: Alert and oriented. Non focal exam. Speech clear  Extremities: Symmetric in appearance bilaterally  Psychiatry: Judgement and insight appear stable. Mood & affect appropriate.    Data Reviewed: I have personally reviewed following labs and imaging studies  CBC: Recent Labs  Lab 12/17/23 0538 12/18/23 0451 12/19/23 0537 12/20/23 0908 12/21/23 0423  WBC 15.0* 10.7* 6.7 7.4 7.7  NEUTROABS  --   --   --  4.4 4.5  HGB 9.0* 8.1* 8.3* 9.0* 8.8*  HCT 28.0* 25.3* 25.8* 28.2* 27.2*  MCV 95.2 95.1 95.6 96.2 95.8  PLT 395 380 381 438* 405*   Basic Metabolic Panel: Recent Labs  Lab 12/17/23 0538 12/18/23 0451 12/19/23 0537 12/20/23 0908 12/21/23 0423  NA 134* 137 134* 133* 132*  K 3.9 3.4* 3.4* 3.6 3.7  CL 99 99 98 98 97*  CO2 22 24 24 23 23   GLUCOSE 185* 148* 161* 144* 126*  BUN 9 6 <5* 8 8  CREATININE 1.05* 0.97 1.01* 0.90 0.93  CALCIUM  8.6* 8.6* 8.5* 8.5* 8.6*  MG 1.8  --  1.6*  --   --    GFR: Estimated Creatinine Clearance: 117.5 mL/min (by C-G formula based on SCr of 0.93 mg/dL). Liver Function Tests: Recent Labs  Lab 12/21/23 0423  AST 28  ALT  19  ALKPHOS 44  BILITOT 0.2  PROT 6.5  ALBUMIN 2.4*  No results for input(s): LIPASE, AMYLASE in the last 168 hours. No results for input(s): AMMONIA in the last 168 hours. Coagulation Profile: Recent Labs  Lab 12/16/23 0849 12/17/23 0538  INR 1.3* 1.2   Cardiac Enzymes: No results for input(s): CKTOTAL, CKMB, CKMBINDEX, TROPONINI in the last 168 hours. BNP (last 3 results) No results for input(s): PROBNP in the last 8760 hours. HbA1C: No results for input(s): HGBA1C in the last 72 hours. CBG: Recent Labs  Lab 12/19/23 2334 12/20/23 0916 12/20/23 1432 12/20/23 1738 12/20/23 2120  GLUCAP 200* 143* 132* 161* 156*   Lipid Profile: No results for input(s): CHOL, HDL, LDLCALC, TRIG, CHOLHDL, LDLDIRECT in the last 72 hours. Thyroid Function Tests: No results for input(s): TSH, T4TOTAL, FREET4, T3FREE, THYROIDAB in the last 72 hours. Anemia Panel: No results for input(s): VITAMINB12, FOLATE, FERRITIN, TIBC, IRON, RETICCTPCT in the last 72 hours. Sepsis Labs: No results for input(s): PROCALCITON, LATICACIDVEN in the last 168 hours.  Recent Results (from the past 240 hours)  Aerobic/Anaerobic Culture w Gram Stain (surgical/deep wound)     Status: None (Preliminary result)   Collection Time: 12/17/23  9:51 AM   Specimen: Abscess  Result Value Ref Range Status   Specimen Description ABSCESS  Final   Special Requests PELVIC,RIGHT  Final   Gram Stain   Final    ABUNDANT WBC PRESENT, PREDOMINANTLY PMN NO ORGANISMS SEEN    Culture   Final    NO GROWTH 3 DAYS NO ANAEROBES ISOLATED; CULTURE IN PROGRESS FOR 5 DAYS Performed at [facility], [address], [place], [place] [address]    Report Status PENDING  Incomplete      Radiology Studies: No results found.     Scheduled Meds:  amLODipine  5 mg Oral Daily   apixaban  10 mg Oral BID   Followed by   NOREEN ON 12/25/2023] apixaban  5 mg Oral BID    atorvastatin  80 mg Oral Daily   chlorthalidone   25 mg Oral Daily   doxycycline   100 mg Oral Q12H   gabapentin  300 mg Oral TID   glycopyrrolate   1 mg Oral BID   insulin  aspart  0-15 Units Subcutaneous TID WC   insulin  aspart  0-5 Units Subcutaneous QHS   insulin  glargine-yfgn  10 Units Subcutaneous Daily   losartan  50 mg Oral Daily   metoCLOPramide   5 mg Oral Q8H   metroNIDAZOLE   500 mg Oral Q8H   polyethylene glycol  17 g Oral QODAY   scopolamine   1 patch Transdermal Q72H   Continuous Infusions:     LOS: 20 days   Time spent: 25 minutes   Delon Hoe, DO [facility] 12/21/2023, 9:32 AM   Available via Epic secure chat 7am-7pm After these hours, please refer to coverage provider listed on [email or web address]

## 2023-12-21 NOTE — Progress Notes (Signed)
 Daily Gynecology Note  Admission Date: 12/01/2023 Current Date: 12/21/2023 3:30 PM  Hospital Course Misty Walsh is a 33 y.o. G1P0101 admitted by the [facility] service for possible urosepsis on 10/13, with 20cm pelvic abscess/likely recurrent TOA seen on imaging. Patient started on IV abx and [facility] IR didn't feel able to drain. She was then transferred to  [facility] GYN on 10/17 and IR at [facility] didn't feel able to drain, as well.   She was taken to the OR by GYN and Gen Surg on 10/20 and had a ex-lap via vertical midline/LOA/removal of left adnexal cyst wall/washout/JP drain placement x 2 (left and right posterior gutters) and closed with staples. Operative findings significant for extensive adhesions, difficulty identifying GYN anatomy with large amount of sero-sang fluid during surgery; surg path of cyst wall, cultures and gram stain all negative, as well as final admit blood cultures.   Post op, patient diagnosed with bilateral LE DVTs and PE and started on anti-coagulation.    She also had drainage of another collection by IR on 10/29, and has a transgluteal drain in place.  Staples and surgical drains were removed 11/1. Unfortunately, superior aspect of incision opened superficially with depth of 1cm. No evidence of infection was noted.   Overnight/24hr events:  Staples & RLQ/LLQ drains removed Superior aspect of incision opened to depth of 1cm, packed wet to dry LFTs normal, RUQ US  obtained for persistent pain & n/v - biliary sludge & hepatic steatosis.  Subjective:  Frustrated with slow progress and the fact that her wound opened up last night. She has had a wound vac in the past so is familiar with the process and care. Is wondering if home health care could be arranged for her.   She continues to have pain at the upper aspect of her incision. She also reported some RUQ pain. She was able to tolerate sandwich and salad. Continues to have nausea and spitting, but she is  keeping her PO medications down and is no longer requiring IV medications.   Objective:    Current Vital Signs 24h Vital Sign Ranges  T 98.4 F (36.9 C) Temp  Avg: 98.3 F (36.8 C)  Min: 98 F (36.7 C)  Max: 98.6 F (37 C)  BP 126/82 BP  Min: 122/73  Max: 138/73  HR 97 Pulse  Avg: 97.4  Min: 90  Max: 102  RR 19 Resp  Avg: 18.2  Min: 17  Max: 19  SaO2 97 % Room Air SpO2  Avg: 98.6 %  Min: 97 %  Max: 100 %       24 Hour I/O Current Shift I/O  Time Ins Outs 11/01 0701 - 11/02 0700 In: 10  Out: 13 [Drains:13] No intake/output data recorded.   Patient Vitals for the past 24 hrs:  BP Temp Temp src Pulse Resp SpO2  12/21/23 1151 -- 98.4 F (36.9 C) Oral -- 19 --  12/21/23 0845 126/82 98.2 F (36.8 C) Oral 97 18 97 %  12/21/23 0424 125/75 98 F (36.7 C) Oral (!) 102 18 98 %  12/21/23 0037 122/73 98.6 F (37 C) Oral 100 19 100 %  12/20/23 1950 (!) 130/92 98.2 F (36.8 C) Oral 98 18 100 %  12/20/23 1738 138/73 98.5 F (36.9 C) Oral 90 17 98 %    Physical exam: General: Well nourished, well developed female in no acute distress. Abdomen: obese; VML with superior portion packed WTD.  Cardiovascular: normal rate Respiratory: normal effort  Transgluteal drain minimal sero-sang fluid in it, bulb on suction.  Medications: Current Facility-Administered Medications  Medication Dose Route Frequency Provider Last Rate Last Admin   acetaminophen  (TYLENOL ) tablet 650 mg  650 mg Oral Q6H PRN Rojelio Nest, DO   650 mg at 12/20/23 1445   alum & mag hydroxide-simeth (MAALOX/MYLANTA) 200-200-20 MG/5ML suspension 30 mL  30 mL Oral Q4H PRN Jadine Toribio SQUIBB, MD   30 mL at 12/03/23 0321   amLODipine (NORVASC) tablet 5 mg  5 mg Oral Daily Al-Sultani, Anmar, MD   5 mg at 12/21/23 1106   apixaban (ELIQUIS) tablet 10 mg  10 mg Oral BID Zina Jerilynn LABOR, MD   10 mg at 12/21/23 1108   Followed by   NOREEN ON 12/25/2023] apixaban (ELIQUIS) tablet 5 mg  5 mg Oral BID Zina Jerilynn LABOR, MD        atorvastatin (LIPITOR) tablet 80 mg  80 mg Oral Daily Lue Elsie BROCKS, MD   80 mg at 12/21/23 1108   chlorthalidone  (HYGROTON ) tablet 25 mg  25 mg Oral Daily Lue Elsie BROCKS, MD   25 mg at 12/21/23 1113   diphenhydrAMINE  (BENADRYL ) capsule 25 mg  25 mg Oral Q6H PRN Rojelio Nest, DO       doxycycline  (VIBRA -TABS) tablet 100 mg  100 mg Oral Q12H Rojelio Nest, DO   100 mg at 12/21/23 0535   gabapentin (NEURONTIN) capsule 300 mg  300 mg Oral TID Ozan, Jennifer, DO   300 mg at 12/21/23 1106   glycopyrrolate  (ROBINUL ) tablet 1 mg  1 mg Oral BID Rojelio Nest, DO   1 mg at 12/21/23 1107   glycopyrrolate  (ROBINUL ) tablet 2 mg  2 mg Oral TID PRN Markevious Ehmke C, MD   2 mg at 12/21/23 0424   insulin  aspart (novoLOG) injection 0-15 Units  0-15 Units Subcutaneous TID WC Al-Sultani, Anmar, MD   3 Units at 12/20/23 1746   insulin  aspart (novoLOG) injection 0-5 Units  0-5 Units Subcutaneous QHS Al-Sultani, Anmar, MD   2 Units at 12/18/23 2120   insulin  glargine-yfgn [facility]) injection 10 Units  10 Units Subcutaneous Daily Erik Kieth BROCKS, MD   10 Units at 12/21/23 1150   loperamide (IMODIUM) capsule 2 mg  2 mg Oral PRN Arlon Carliss ORN, DO   2 mg at 12/08/23 0951   losartan (COZAAR) tablet 50 mg  50 mg Oral Daily Al-Sultani, Anmar, MD   50 mg at 12/21/23 1107   menthol  (CEPACOL) lozenge 3 mg  1 lozenge Oral PRN Ozan, Jennifer, DO   3 mg at 12/10/23 2053   metoCLOPramide  (REGLAN ) tablet 5 mg  5 mg Oral Q8H Zina Jerilynn LABOR, MD   5 mg at 12/21/23 1416   metroNIDAZOLE  (FLAGYL ) tablet 500 mg  500 mg Oral Q8H Rojelio Nest, DO   500 mg at 12/21/23 1416   ondansetron  (ZOFRAN ) injection 4 mg  4 mg Intravenous Q6H PRN Anyanwu, Ugonna A, MD   4 mg at 12/19/23 1104   Or   ondansetron  (ZOFRAN ) tablet 4 mg  4 mg Oral Q6H PRN Anyanwu, Ugonna A, MD   4 mg at 12/20/23 9077   Oral care mouth rinse  15 mL Mouth Rinse PRN Jadine Toribio SQUIBB, MD       oxyCODONE  (Oxy IR/ROXICODONE ) immediate release tablet  5-10 mg  5-10 mg Oral Q4H PRN Lue Elsie BROCKS, MD   5 mg at 12/21/23 1421   polyethylene glycol (MIRALAX  / GLYCOLAX ) packet 17 g  17  g Oral QODAY Pickens, Charlie, MD   17 g at 12/20/23 1146   promethazine  (PHENERGAN ) tablet 25 mg  25 mg Oral Q6H PRN Zina Jerilynn LABOR, MD   25 mg at 12/21/23 1154   scopolamine  (TRANSDERM-SCOP) 1 MG/3DAYS 1 mg  1 patch Transdermal Q72H Zina Jerilynn LABOR, MD   1 mg at 12/18/23 1748   senna-docusate (Senokot-S) tablet 1 tablet  1 tablet Oral QHS PRN Izell Harari, MD       Labs:  CBGs 140s Recent Labs  Lab 12/19/23 0537 12/20/23 0908 12/21/23 0423  WBC 6.7 7.4 7.7  HGB 8.3* 9.0* 8.8*  HCT 25.8* 28.2* 27.2*  PLT 381 438* 405*   Recent Labs  Lab 12/19/23 0537 12/20/23 0908 12/21/23 0423  NA 134* 133* 132*  K 3.4* 3.6 3.7  CL 98 98 97*  CO2 24 23 23   BUN <5* 8 8  CREATININE 1.01* 0.90 0.93  CALCIUM  8.5* 8.5* 8.6*  PROT  --   --  6.5  BILITOT  --   --  0.2  ALKPHOS  --   --  44  ALT  --   --  19  AST  --   --  28  GLUCOSE 161* 144* 126*   Radiology:  US  Abdomen Limited RUQ (LIVER/GB) Result Date: 12/21/2023 CLINICAL DATA:  Abdominal pain EXAM: ULTRASOUND ABDOMEN LIMITED RIGHT UPPER QUADRANT COMPARISON:  None Available. FINDINGS: Gallbladder: No gallstones or wall thickening visualized. Contains layering sludge. No sonographic Murphy sign noted by sonographer. Common bile duct: Diameter: 4 mm Liver: Diffusely increased hepatic echogenicity with sparing along the gallbladder fossa. portal vein is patent on color Doppler imaging with normal direction of blood flow towards the liver. Other: None. IMPRESSION: 1. Gallbladder sludge without sonographic evidence of acute cholecystitis. 2. Hepatic steatosis. Electronically Signed   By: Limin  Xu M.D.   On: 12/21/2023 10:26    Assessment & Plan:  Patient stable. Improving slowly, but recent setback with incisional separation.  Current barriers to discharge are wound care and persistent  nausea.  *GYN: Separation of superior aspect of incision to depth of 1cm. Daily wet to dry dressing changes; wound care consult 11/3 AM.  Patient is wondering is she could qualify for home health services to assist with dressing changes. Will address with TOC 11/3 AM PT is following to help with ambulation, recommend rolling walker on discharge. OK for discharge home from PT standpoint once medically appropriate.  *ID: Afebrile, leukocytosis resolved. Bcx NGF; drain cultures also no growth.  IR drain in place until output < 10cc/d. IR has tentatively planned outpatient follow up and removal  Continues on doxy/flagyl  (D8). s/p rocephin  10/13-10/22; cefoxitin x 1 on 10/20; doxy IV 10/13-10/25; flagyl  IV 10/14-10/24; zosyn x 1 on 10/13 Planning 14 day course of antibiotics  *Heme: Transitioned back to Eliquis for DVT/PE Prev d/w vascular surgery and medication management only recommended  *Renal: Cr normal this AM. Continue trending.    *FEN/GI: ADAT. Continue PO antiemetics and robinul . LFTs & RUQ US  are unrevealing this morning - biliary sludge only. Appreciate medicine recs.   *Endo: DM coordinator following. Increased semglee to 10u daily. Appreciate medicine & DM recs. Patient requests PO medications for discharge home and would like to avoid insulin  rx if at all possible.  *Pulm: see above. No current issues. Appreciate medicine recs *CV: continue current regimen. Appreciate medicine recs  *Dispo: Continue inpatient admission. Will require significant coordination of care tomorrow including wound care and potential home health.  Discharge is pending this coordination of care.   Kieth JAYSON Carolin, MD Attending Center for [facility]) GYN Consult Phone: [phone] (M-F, [phone]) & [phone]  (Off hours, weekends, holidays)

## 2023-12-22 ENCOUNTER — Other Ambulatory Visit (HOSPITAL_COMMUNITY): Payer: Self-pay

## 2023-12-22 LAB — AEROBIC/ANAEROBIC CULTURE W GRAM STAIN (SURGICAL/DEEP WOUND): Culture: NO GROWTH

## 2023-12-22 LAB — BASIC METABOLIC PANEL WITH GFR
Anion gap: 15 (ref 5–15)
BUN: 10 mg/dL (ref 6–20)
CO2: 22 mmol/L (ref 22–32)
Calcium: 8.9 mg/dL (ref 8.9–10.3)
Chloride: 99 mmol/L (ref 98–111)
Creatinine, Ser: 0.98 mg/dL (ref 0.44–1.00)
GFR, Estimated: >60 mL/min (ref >60)
Glucose, Bld: 121 mg/dL — ABNORMAL HIGH (ref 70–99)
Potassium: 4.9 mmol/L (ref 3.5–5.1)
Sodium: 136 mmol/L (ref 135–145)

## 2023-12-22 LAB — CBC WITH DIFFERENTIAL/PLATELET
Abs Immature Granulocytes: 0.06 K/uL (ref 0.00–0.07)
Basophils Absolute: 0.1 K/uL (ref 0.0–0.1)
Basophils Relative: 1 %
Eosinophils Absolute: 0.2 K/uL (ref 0.0–0.5)
Eosinophils Relative: 2 %
HCT: 28.5 % — ABNORMAL LOW (ref 36.0–46.0)
Hemoglobin: 9.3 g/dL — ABNORMAL LOW (ref 12.0–15.0)
Immature Granulocytes: 1 %
Lymphocytes Relative: 25 %
Lymphs Abs: 2.3 K/uL (ref 0.7–4.0)
MCH: 31 pg (ref 26.0–34.0)
MCHC: 32.6 g/dL (ref 30.0–36.0)
MCV: 95 fL (ref 80.0–100.0)
Monocytes Absolute: 1 K/uL (ref 0.1–1.0)
Monocytes Relative: 11 %
Neutro Abs: 5.6 K/uL (ref 1.7–7.7)
Neutrophils Relative %: 60 %
Platelets: 404 K/uL — ABNORMAL HIGH (ref 150–400)
RBC: 3 MIL/uL — ABNORMAL LOW (ref 3.87–5.11)
RDW: 14.2 % (ref 11.5–15.5)
WBC: 9.2 K/uL (ref 4.0–10.5)
nRBC: 0 % (ref 0.0–0.2)

## 2023-12-22 LAB — GLUCOSE, CAPILLARY
Glucose-Capillary: 117 mg/dL — ABNORMAL HIGH (ref 70–99)
Glucose-Capillary: 151 mg/dL — ABNORMAL HIGH (ref 70–99)
Glucose-Capillary: 115 mg/dL — ABNORMAL HIGH (ref 70–99)

## 2023-12-22 MED ORDER — ENSURE PLUS HIGH PROTEIN PO LIQD
237.0000 mL | Freq: Four times a day (QID) | ORAL | Status: DC
  Administered 2023-12-22 – 2023-12-23 (×3): 237 mL via ORAL
  Filled 2023-12-22 (×10): qty 237

## 2023-12-22 NOTE — Progress Notes (Signed)
 PROGRESS NOTE    Misty Walsh  FMW:[id number] DOB: [birth year] DOA: 12/01/2023 PCP: Buck Search, PA-C   Brief Narrative: This 33 year old female with PMH significant for diabetes, presented with abdominal pain,  nausea and dry heaving, presented with sepsis and huge suspected tubo-ovarian abscess.  Transferred from [facility] to [facility]. GYN was consulted. She underwent ex lap and drainage of tubo-ovarian abscess/pelvic abscess by Dr. Vanderbilt and Dr. Ozan 10/20. Post-operatively, she was found to have PE and DVT and was started on anticoagulation. Repeat CT showed new pelvic abscesses and IR was consulted for drainage. Drains removed 11/1.  We were consulted for medical management.  Assessment & Plan:   Principal Problem:   Sepsis (HCC) Active Problems:   Type 2 diabetes mellitus without complication, without long-term current use of insulin  (HCC)   Tubo-ovarian abscess   BMI 50.0-59.9, adult (HCC)   Obesity, class 3 (HCC)   AKI (acute kidney injury)   Acute hypoxic respiratory failure (HCC)   S/P exploratory laparotomy   Acute PE: Acute bilateral DVTs: - She was noted to be tachypneic with worsening dyspnea.  Found to have acute PE. - CTA chest (12/10/2023) showed PE over the proximal right upper/middle lobe arteries and subsegmental left lower lobar artery with no evidence of right heart strain. - Etiology unclear, likely multifactorial given significant immobility, obesity, acute illness. - Bilateral LE duplex showed acute bilateral DVTs with proximal involvement on the right - Vascular surgery consulted due to proximal right DVT. - Spoke to Dr. Magda and Dr. Pearline who indicated no surgical intervention at this time, only anticoagulation - Continue eliquis  - Recommend at least 3 months of anticoagulation for likely provoked DVT given significantly reduced mobility. May benefit from referral to outpatient hematology for further delineation of appropriate length of  anticoagulation.   Acute hypoxic respiratory failure - resolved: - Noted to be tachypneic with worsening dyspnea and was ultimately found to have acute PE, possibly complicated further by OHS - On room air.    Sepsis secondary to large tubo-ovarian abscess: - Presented with fever, leukocytosis, noted to have large tubo-ovarian abscess - Blood cultures (10/13) negative - Completed 5 days of rocephin  - On doxycycline  and Flagyl  for TOA as below - WBC improved. - Recommend 10 to 14 days of antibiotics.   Large tubo-ovarian abscess status post ex lap w/ washout 10/20: - Surgical pathology showed benign cyst wall with cyst lining dilatation.  Negative for malignancy - Completed 5 days of Rocephin  - On doxycycline  and Flagyl   - Encouraged OOB - Incentive spirometry - CTA/P with large pelvic cul-de-sac abscess (approximately 11 x 5 x 4.8 cm) with surrounding formation and small adjacent satellite collections: Additional RLQ collection (4 cm) near the abdominal wall possibly abscess versus postop fluid.  Mild bilateral hydroureteronephrosis likely from pelvic inflammation, bladder markedly distended with some air (postcatheterization versus emphysematous cystitis).  Postop anterior abdominal wall changes with small superficial air-fluid collections (seroma/hematoma versus superficial abscess).  Sigmoid wall thickening likely reactive. - Further management per OBGYN - IR placed drain 10/29.  Culture negative to date.    Refractory nausea and vomiting: Refractory excessive oral secretions: - Reports persistent nausea and vomiting as well as excessive oral secretions, not responsive to current therapeutics. - Currently on Zofran , Reglan , Phenergan , Scopolamine  patch, robinul  for secretions   Constipation - Bowel regimen    AKI - resolved - Etiology likely multifactorial - initially decreased PO intake and sepsis, now post IV contrast exposure and Lasix  -  Baseline Cr ~ 0.9.  - Stable     Hypertension: - Continue chlorthalidone  25 mg daily, Losartan to 50 mg daily, and amlodipine 5 mg daily   Bilateral lower extremity swelling, R > L: - Patient reports her legs are more swollen than normal, initially thought due to fluid overload and was receiving Lasix, without much improvement - However, now found to have acute bilateral DVTs, likely the main driver of her BLE swelling - Will continue to monitor and hold off additional Lasix at this time.   Right breast mass: - Incidental finding of 2.5 cm oval mass with few coarse calcifications over the medial right breast was noted on CTA chest - Recommend follow-up with PCP or OB/GYN outpatient diagnostic mammography   T2DM: - Hgb A1c 6.3 (12/02/2023), well-controlled at home - no dispense history for glycemic control agents - Received dexamethasone  intraoperatively and had postop hyperglycemia. - Continue Semglee and SSI    HLD: - Continue Lipitor    Debility: - PT/OT following   Class III / Morbid Obesity (BMI Body mass index is 51.49 kg/m. kg/m) Diet and exercise discussed in detail.   We will sign off. Reconsult if needed.  DVT prophylaxis: Eliquis Code Status: Full code Family Communication: No family at bed side   Procedures:  Expl. Lap  Antimicrobials:  Anti-infectives (From admission, onward)    Start     Dose/Rate Route Frequency Ordered Stop   12/20/23 1400  metroNIDAZOLE  (FLAGYL ) tablet 500 mg        500 mg Oral Every 8 hours 12/20/23 1008     12/20/23 1100  doxycycline  (VIBRA -TABS) tablet 100 mg        100 mg Oral Every 12 hours 12/20/23 1008     12/16/23 1600  metroNIDAZOLE  (FLAGYL ) IVPB 500 mg  Status:  Discontinued        500 mg 100 mL/hr over 60 Minutes Intravenous Every 12 hours 12/16/23 1508 12/20/23 1008   12/16/23 1530  doxycycline  (VIBRAMYCIN ) 100 mg in sodium chloride  0.9 % 250 mL IVPB  Status:  Discontinued        100 mg 125 mL/hr over 120 Minutes Intravenous Every 12 hours 12/16/23  1508 12/20/23 1008   12/13/23 2200  doxycycline  (VIBRA -TABS) tablet 100 mg  Status:  Discontinued        100 mg Oral Every 12 hours 12/13/23 1059 12/16/23 1508   12/13/23 1145  metroNIDAZOLE  (FLAGYL ) tablet 500 mg  Status:  Discontinued        500 mg Oral Every 12 hours 12/13/23 1059 12/16/23 1508   12/13/23 0000  doxycycline  (VIBRA -TABS) 100 MG tablet        100 mg Oral Every 12 hours 12/13/23 1420 12/27/23 2359   12/13/23 0000  metroNIDAZOLE  (FLAGYL ) 500 MG tablet        500 mg Oral Every 12 hours 12/13/23 1420 12/27/23 2359   12/08/23 0845  cefOXitin (MEFOXIN) 2 g in sodium chloride  0.9 % 100 mL IVPB        2 g 200 mL/hr over 30 Minutes Intravenous On call to O.R. 12/08/23 0758 12/08/23 1424   12/06/23 2000  cefTRIAXone  (ROCEPHIN ) 2 g in sodium chloride  0.9 % 100 mL IVPB        2 g 200 mL/hr over 30 Minutes Intravenous Every 24 hours 12/06/23 0723 12/10/23 2142   12/02/23 1800  doxycycline  (VIBRAMYCIN ) 100 mg in sodium chloride  0.9 % 250 mL IVPB  Status:  Discontinued  100 mg 125 mL/hr over 120 Minutes Intravenous Every 12 hours 12/02/23 1736 12/13/23 1059   12/02/23 1800  metroNIDAZOLE  (FLAGYL ) IVPB 500 mg  Status:  Discontinued        500 mg 100 mL/hr over 60 Minutes Intravenous Every 12 hours 12/02/23 1736 12/13/23 1059   12/01/23 2000  cefTRIAXone  (ROCEPHIN ) 2 g in sodium chloride  0.9 % 100 mL IVPB        2 g 200 mL/hr over 30 Minutes Intravenous Every 24 hours 12/01/23 1920 12/05/23 2052   12/01/23 1300  piperacillin-tazobactam (ZOSYN) IVPB 3.375 g        3.375 g 100 mL/hr over 30 Minutes Intravenous  Once 12/01/23 1259 12/01/23 1420      Subjective: Patient was seen and examined at bedside.  Overnight events noted. Patient reports feeling better, She still has some pain in the lower abdomen but reports getting better.  Objective: Vitals:   12/21/23 1950 12/22/23 0414 12/22/23 0814 12/22/23 1128  BP: 122/82 115/73 128/74 139/68  Pulse: (!) 109 (!) 101 94 87  Resp:  18 18 19 19   Temp: 98.3 F (36.8 C) 98.5 F (36.9 C) 98.7 F (37.1 C) 98.4 F (36.9 C)  TempSrc: Oral Oral Oral Oral  SpO2: 97% 95% 99% 98%  Weight:      Height:        Intake/Output Summary (Last 24 hours) at 12/22/2023 1225 Last data filed at 12/22/2023 0000 Gross per 24 hour  Intake 10 ml  Output 23 ml  Net -13 ml   Filed Weights   12/01/23 1243 12/08/23 1246  Weight: 136.1 kg 136.1 kg    Examination:  General exam: Appears calm and comfortable, not in any acute distress. Respiratory system: Clear to auscultation. Respiratory effort normal.  RR 15 Cardiovascular system: S1 & S2 heard, RRR. No JVD, murmurs, rubs, gallops or clicks. Gastrointestinal system: Abdomen is non distended, soft and nontender. No organomegaly or masses felt. Normal bowel sounds heard.  Status post Expl Lap. Central nervous system: Alert and oriented X 3. No focal neurological deficits. Extremities: No edema, no cyanosis, no clubbing. Skin: No rashes, lesions or ulcers Psychiatry: Judgement and insight appear normal. Mood & affect appropriate.     Data Reviewed: I have personally reviewed following labs and imaging studies  CBC: Recent Labs  Lab 12/18/23 0451 12/19/23 0537 12/20/23 0908 12/21/23 0423 12/22/23 0732  WBC 10.7* 6.7 7.4 7.7 9.2  NEUTROABS  --   --  4.4 4.5 5.6  HGB 8.1* 8.3* 9.0* 8.8* 9.3*  HCT 25.3* 25.8* 28.2* 27.2* 28.5*  MCV 95.1 95.6 96.2 95.8 95.0  PLT 380 381 438* 405* 404*   Basic Metabolic Panel: Recent Labs  Lab 12/17/23 0538 12/18/23 0451 12/19/23 0537 12/20/23 0908 12/21/23 0423 12/22/23 0412  NA 134* 137 134* 133* 132* 136  K 3.9 3.4* 3.4* 3.6 3.7 4.9  CL 99 99 98 98 97* 99  CO2 22 24 24 23 23 22   GLUCOSE 185* 148* 161* 144* 126* 121*  BUN 9 6 <5* 8 8 10   CREATININE 1.05* 0.97 1.01* 0.90 0.93 0.98  CALCIUM  8.6* 8.6* 8.5* 8.5* 8.6* 8.9  MG 1.8  --  1.6*  --   --   --    GFR: Estimated Creatinine Clearance: 111.5 mL/min (by C-G formula based on  SCr of 0.98 mg/dL). Liver Function Tests: Recent Labs  Lab 12/21/23 0423  AST 28  ALT 19  ALKPHOS 44  BILITOT 0.2  PROT 6.5  ALBUMIN 2.4*   No results for input(s): LIPASE, AMYLASE in the last 168 hours. No results for input(s): AMMONIA in the last 168 hours. Coagulation Profile: Recent Labs  Lab 12/16/23 0849 12/17/23 0538  INR 1.3* 1.2   Cardiac Enzymes: No results for input(s): CKTOTAL, CKMB, CKMBINDEX, TROPONINI in the last 168 hours. BNP (last 3 results) No results for input(s): PROBNP in the last 8760 hours. HbA1C: No results for input(s): HGBA1C in the last 72 hours. CBG: Recent Labs  Lab 12/20/23 2120 12/21/23 1120 12/21/23 1628 12/21/23 2209 12/22/23 1209  GLUCAP 156* 114* 122* 147* 117*   Lipid Profile: No results for input(s): CHOL, HDL, LDLCALC, TRIG, CHOLHDL, LDLDIRECT in the last 72 hours. Thyroid Function Tests: No results for input(s): TSH, T4TOTAL, FREET4, T3FREE, THYROIDAB in the last 72 hours. Anemia Panel: No results for input(s): VITAMINB12, FOLATE, FERRITIN, TIBC, IRON, RETICCTPCT in the last 72 hours. Sepsis Labs: No results for input(s): PROCALCITON, LATICACIDVEN in the last 168 hours.  Recent Results (from the past 240 hours)  Aerobic/Anaerobic Culture w Gram Stain (surgical/deep wound)     Status: None (Preliminary result)   Collection Time: 12/17/23  9:51 AM   Specimen: Abscess  Result Value Ref Range Status   Specimen Description ABSCESS  Final   Special Requests PELVIC,RIGHT  Final   Gram Stain   Final    ABUNDANT WBC PRESENT, PREDOMINANTLY PMN NO ORGANISMS SEEN    Culture   Final    NO GROWTH 4 DAYS NO ANAEROBES ISOLATED; CULTURE IN PROGRESS FOR 5 DAYS Performed at [facility], [address] [place], [place] [address]    Report Status PENDING  Incomplete    Radiology Studies: US  Abdomen Limited RUQ (LIVER/GB) Result Date: 12/21/2023 CLINICAL DATA:  Abdominal  pain EXAM: ULTRASOUND ABDOMEN LIMITED RIGHT UPPER QUADRANT COMPARISON:  None Available. FINDINGS: Gallbladder: No gallstones or wall thickening visualized. Contains layering sludge. No sonographic Murphy sign noted by sonographer. Common bile duct: Diameter: 4 mm Liver: Diffusely increased hepatic echogenicity with sparing along the gallbladder fossa. portal vein is patent on color Doppler imaging with normal direction of blood flow towards the liver. Other: None. IMPRESSION: 1. Gallbladder sludge without sonographic evidence of acute cholecystitis. 2. Hepatic steatosis. Electronically Signed   By: Limin  Xu M.D.   On: 12/21/2023 10:26   Scheduled Meds:  amLODipine  5 mg Oral Daily   apixaban  10 mg Oral BID   Followed by   NOREEN ON 12/25/2023] apixaban  5 mg Oral BID   atorvastatin  80 mg Oral Daily   chlorthalidone   25 mg Oral Daily   doxycycline   100 mg Oral Q12H   feeding supplement  237 mL Oral QID   gabapentin  300 mg Oral TID   glycopyrrolate   1 mg Oral BID   insulin  aspart  0-15 Units Subcutaneous TID WC   insulin  aspart  0-5 Units Subcutaneous QHS   insulin  glargine-yfgn  10 Units Subcutaneous Daily   losartan  50 mg Oral Daily   metoCLOPramide   5 mg Oral Q8H   metroNIDAZOLE   500 mg Oral Q8H   polyethylene glycol  17 g Oral QODAY   scopolamine   1 patch Transdermal Q72H   Continuous Infusions:   LOS: 21 days    Time spent: 50 mins    Darcel Dawley, MD [facility]   If 7PM-7AM, please contact night-coverage

## 2023-12-22 NOTE — Progress Notes (Signed)
 Physical Therapy Treatment Patient Details Name: Misty Walsh MRN: [id number] DOB: Jun 29, 1990 Today's Date: 12/22/2023   History of Present Illness Pt is 33 year old presented to [facility] 12/01/23 for nausea and abd pain. Found to have tubo-ovarian abscess. Transferred to [facility] on 12/04/23. Pt underwent exploratory lap, lysis of adhesions and removal of lt cyst wall on 12/08/23. Pt found to have acute PE and acute bilateral DVT 10/22. Drain placed by IR 10/29. Incision dehisced and VAC placed. PMH - DM, morbid obesity, HTN    PT Comments  Pt making good progress with mobility and ready for dc from PT standpoint. Now has VAC but home VAC should be small enough to manage with walker.     If plan is discharge home, recommend the following: Assistance with cooking/housework;Assist for transportation   Can travel by private vehicle        Equipment Recommendations  Rolling walker (2 wheels) (already received)    Recommendations for Other Services       Precautions / Restrictions Precautions Precautions: Other (comment) Recall of Precautions/Restrictions: Intact Precaution/Restrictions Comments: abd drain, wound vac Required Braces or Orthoses: Other Brace Other Brace: abdominal binder Restrictions Weight Bearing Restrictions Per Provider Order: No     Mobility  Bed Mobility Overal bed mobility: Modified Independent Bed Mobility: Sit to Supine       Sit to supine: Modified independent (Device/Increase time)        Transfers Overall transfer level: Modified independent Equipment used: Rolling walker (2 wheels) Transfers: Sit to/from Stand Sit to Stand: Modified independent (Device/Increase time)                Ambulation/Gait Ambulation/Gait assistance: Supervision Gait Distance (Feet): 350 Feet Assistive device: Rolling walker (2 wheels), IV Pole, None Gait Pattern/deviations: Step-through pattern, Decreased stride length, Wide base of support Gait velocity:  decr Gait velocity interpretation: <1.31 ft/sec, indicative of household ambulator   General Gait Details: Supervision for wound VAC. Amb in room distance without device and pushing IV pole. Was a little tentative but no instability. 2 standing rest breaks   Stairs             Wheelchair Mobility     Tilt Bed    Modified Rankin (Stroke Patients Only)       Balance Overall balance assessment: No apparent balance deficits (not formally assessed)                                          Communication Communication Communication: No apparent difficulties  Cognition Arousal: Alert Behavior During Therapy: Flat affect   PT - Cognitive impairments: No apparent impairments                         Following commands: Intact      Cueing Cueing Techniques: Verbal cues  Exercises      General Comments        Pertinent Vitals/Pain Pain Assessment Pain Assessment: Faces Faces Pain Scale: Hurts a little bit Pain Location: abdomen Pain Descriptors / Indicators: Aching, Grimacing    Home Living                          Prior Function            PT Goals (current goals can now be found in the  care plan section) Acute Rehab PT Goals Patient Stated Goal: to return to independence Progress towards PT goals: Progressing toward goals    Frequency    Min 3X/week      PT Plan      Co-evaluation              AM-PAC PT 6 Clicks Mobility   Outcome Measure  Help needed turning from your back to your side while in a flat bed without using bedrails?: None Help needed moving from lying on your back to sitting on the side of a flat bed without using bedrails?: None Help needed moving to and from a bed to a chair (including a wheelchair)?: None Help needed standing up from a chair using your arms (e.g., wheelchair or bedside chair)?: None Help needed to walk in hospital room?: None Help needed climbing 3-5 steps with a  railing? : A Little 6 Click Score: 23    End of Session   Activity Tolerance: Patient tolerated treatment well Patient left: with call bell/phone within reach;with family/visitor present;in bed Nurse Communication: Mobility status PT Visit Diagnosis: Muscle weakness (generalized) (M62.81);Difficulty in walking, not elsewhere classified (R26.2);Pain Pain - part of body:  (abdomen and back)     Time: 8676-8658 PT Time Calculation (min) (ACUTE ONLY): 18 min  Charges:    $Gait Training: 8-22 mins PT General Charges $$ ACUTE PT VISIT: 1 Visit                     [facility] PT Acute Rehabilitation Services Office [phone]    [facility] ORN [facility] 12/22/2023, 2:40 PM

## 2023-12-22 NOTE — Consult Note (Signed)
 WOC Nurse Consult Note: Reason for Consult: evaluate for wound care; wound dehiscence;  s/p GYN surgery 10/20 with elevated BMI Wound type: surgical wound Pressure Injury POA: NA Measurement: 6cm x 3cm cm x 2cm  Wound bed:100% clean subcutaneous tissue Drainage (amount, consistency, odor) serosanguinous  Periwound: intact, pendulous abdomen Dressing procedure/placement/frequency: Medium Peel and Place dressing placed, patient could use small dressing, however not available on the [facility] campus today.  I have asked the materials department to order small dressings and deliver to the unit to replace the medium I used today.  Patient with MCD pending; working with TOC on DC planning.  Moist to moist dressing vs. NPWT.  Would have to have dressings changed in the MD office, no HHRN with lack of insurance coverage.   Wood[facility], CNS, CWON-AP [phone]

## 2023-12-22 NOTE — Progress Notes (Signed)
 Patient ID: Misty Walsh, female   DOB: [birth year], 33 y.o.   MRN: [id number]    Referring Physician(s): Izell Harari, MD   Supervising Physician: Dr. Johann  Patient Status:  [facility] - In-pt  Chief Complaint:  Pelvic abscess; s/p pelvic drain placement 10/29 with Dr. Vanice   Subjective:  Pt seen by surgical team this AM. Plans for DC tomorrow and goals for increasing mobility today. 23 cc out overnight. Tolerating PO. Experiencing continued abd pain. Surgical drains no longer present. She tells me that she has arranged for friends and family to come to home to help her flush drain daily and with dressing changes.    Allergies: Macrobid  [nitrofurantoin  macrocrystal]  Medications: Prior to Admission medications   Medication Sig Start Date End Date Taking? Authorizing Provider  acetaminophen  (TYLENOL ) 500 MG tablet Take 1,000-1,500 mg by mouth every 6 (six) hours as needed for mild pain (pain score 1-3) or moderate pain (pain score 4-6).   Yes [provider]  ALLERGY RELIEF 180 MG tablet Take by mouth. 06/10/22  Yes [provider]  insulin  aspart (NOVOLOG FLEXPEN) 100 UNIT/ML FlexPen Use sliding scale for TID usage with meals and at bedtime 12/14/23  Yes Zina Jerilynn LABOR, MD  insulin  glargine (LANTUS) 100 unit/mL SOPN Inject 6 Units into the skin daily. 12/14/23  Yes Zina Jerilynn LABOR, MD  Insulin  Pen Needle 32G X 4 MM MISC use 1 needle as directed then discard 12/14/23  Yes Zina Jerilynn LABOR, MD  acetaminophen  (TYLENOL ) 325 MG tablet Take 2 tablets (650 mg total) by mouth every 6 (six) hours. 12/13/23   Ozan, Jennifer, DO  amLODipine (NORVASC) 5 MG tablet Take 1 tablet (5 mg total) by mouth daily. 12/14/23   Zina Jerilynn LABOR, MD  apixaban (ELIQUIS) 5 MG TABS tablet Take 2 tablets (10 mg total) by mouth 2 (two) times daily for 5 days, THEN 1 tablet (5 mg total) 2 (two) times daily. 12/13/23 02/16/24  Ozan, Jennifer, DO  atorvastatin (LIPITOR) 80 MG tablet Take 1  tablet (80 mg total) by mouth daily. 12/14/23   Ozan, Jennifer, DO  chlorthalidone  (HYGROTON ) 25 MG tablet Take 1 tablet (25 mg total) by mouth daily. 12/14/23   Zina Jerilynn LABOR, MD  doxycycline  (VIBRA -TABS) 100 MG tablet Take 1 tablet (100 mg total) by mouth every 12 (twelve) hours for 14 days. 12/13/23 12/27/23  Ozan, Jennifer, DO  glycopyrrolate  (ROBINUL ) 1 MG tablet Take 1 tablet (1 mg total) by mouth 3 (three) times daily. 12/14/23   Zina Jerilynn LABOR, MD  losartan (COZAAR) 50 MG tablet Take 1 tablet (50 mg total) by mouth daily. 12/14/23   Zina Jerilynn LABOR, MD  metoCLOPramide  (REGLAN ) 5 MG tablet Take 1 tablet (5 mg total) by mouth every 8 (eight) hours for 14 days. 12/14/23 12/28/23  Zina Jerilynn LABOR, MD  metroNIDAZOLE  (FLAGYL ) 500 MG tablet Take 1 tablet (500 mg total) by mouth every 12 (twelve) hours for 14 days. 12/13/23 12/27/23  Ozan, Jennifer, DO  ondansetron  (ZOFRAN ) 4 MG tablet Take 1 tablet (4 mg total) by mouth every 6 (six) hours as needed for nausea or vomiting. 12/14/23   Zina Jerilynn LABOR, MD  oxyCODONE  (OXY IR/ROXICODONE ) 5 MG immediate release tablet Take 1-2 tablets (5-10 mg total) by mouth every 6 (six) hours as needed for up to 7 days for moderate pain (pain score 4-6) or severe pain (pain score 7-10). 12/13/23 12/20/23  Ozan, Jennifer, DO  promethazine  (PHENERGAN ) 25 MG tablet Take 1 tablet (25  mg total) by mouth every 6 (six) hours as needed for refractory nausea / vomiting. 12/13/23   Ozan, Jennifer, DO  scopolamine  (TRANSDERM-SCOP) 1 MG/3DAYS Place 1 patch (1 mg total) onto the skin every 3 (three) days. 12/15/23   Zina Jerilynn LABOR, MD     Vital Signs: BP 128/74 (BP Location: Right Wrist)   Pulse 94   Temp 98.7 F (37.1 C) (Oral)   Resp 19   Ht 5' 4 (1.626 m)   Wt 300 lb (136.1 kg)   LMP 11/18/2023 (Exact Date)   SpO2 99%   BMI 51.49 kg/m   Physical Exam Vitals and nursing note reviewed.  Constitutional:      Appearance: Normal appearance.  Pulmonary:      Effort: Pulmonary effort is normal.  Abdominal:     Palpations: Abdomen is soft.  Musculoskeletal:     Comments: + right transgluteal drain. No overlying abnormality. Drain flushes and aspirates easily.   Skin:    General: Skin is warm and dry.  Neurological:     Mental Status: She is alert and oriented to person, place, and time. Mental status is at baseline.   Imaging: US  Abdomen Limited RUQ (LIVER/GB) Result Date: 12/21/2023 CLINICAL DATA:  Abdominal pain EXAM: ULTRASOUND ABDOMEN LIMITED RIGHT UPPER QUADRANT COMPARISON:  None Available. FINDINGS: Gallbladder: No gallstones or wall thickening visualized. Contains layering sludge. No sonographic Murphy sign noted by sonographer. Common bile duct: Diameter: 4 mm Liver: Diffusely increased hepatic echogenicity with sparing along the gallbladder fossa. portal vein is patent on color Doppler imaging with normal direction of blood flow towards the liver. Other: None. IMPRESSION: 1. Gallbladder sludge without sonographic evidence of acute cholecystitis. 2. Hepatic steatosis. Electronically Signed   By: Limin  Xu M.D.   On: 12/21/2023 10:26    Labs:  CBC: Recent Labs    12/19/23 0537 12/20/23 0908 12/21/23 0423 12/22/23 0732  WBC 6.7 7.4 7.7 9.2  HGB 8.3* 9.0* 8.8* 9.3*  HCT 25.8* 28.2* 27.2* 28.5*  PLT 381 438* 405* 404*    COAGS: Recent Labs    12/05/23 0730 12/08/23 0436 12/15/23 1428 12/16/23 0849 12/17/23 0538 12/17/23 2117 12/18/23 0451  INR 1.2 1.2  --  1.3* 1.2  --   --   APTT  --   --    < > 85* 60* 79* 69*   < > = values in this interval not displayed.    BMP: Recent Labs    12/19/23 0537 12/20/23 0908 12/21/23 0423 12/22/23 0412  NA 134* 133* 132* 136  K 3.4* 3.6 3.7 4.9  CL 98 98 97* 99  CO2 24 23 23 22   GLUCOSE 161* 144* 126* 121*  BUN <5* 8 8 10   CALCIUM  8.5* 8.5* 8.6* 8.9  CREATININE 1.01* 0.90 0.93 0.98  GFRNONAA >60 >60 >60 >60    LIVER FUNCTION TESTS: Recent Labs    12/11/23 0057  12/12/23 0437 12/13/23 0415 12/21/23 0423  BILITOT 0.5 0.5 0.5 0.2  AST 47* 42* 41 28  ALT 25 23 24 19   ALKPHOS 49 46 48 44  PROT 6.7 6.4* 6.3* 6.5  ALBUMIN 2.2* 2.2* 2.3* 2.4*    Assessment and Plan:  S/p right transgluteal pelvic drain placed 10/29 - drain doing well. Flushes/aspirates easily - 23 cc out overnight, hazy pink output - WBC wnl - VSS, afebrile  Drain Location: right transgluteal pelvic drain Size: Fr size: 10 Fr Date of placement: 12/17/23  Currently to: Drain collection device: suction bulb 24  hour output:  Output by Drain (mL) 12/20/23 0701 - 12/20/23 1900 12/20/23 1901 - 12/21/23 0700 12/21/23 0701 - 12/21/23 1900 12/21/23 1901 - 12/22/23 0700 12/22/23 0701 - 12/22/23 1127  Closed System Drain 3 Inferior;Right Back Bulb (JP) 10 Fr. 8 5 5 18       Current examination: Flushes/aspirates easily.  Insertion site unremarkable. Soreness improved and appropriate.  Suture and stat lock in place. Dressed appropriately.    Plan while still inpatient: Continue TID flushes with 5 cc NS. Record output Q shift. Dressing changes QD or PRN if soiled.  Call IR APP or on call IR MD if difficulty flushing or sudden change in drain output.   Discharge planning: Plans for DC tomorrow. Clinic follow up, flushes to TOC, and DC instructions completed with previous rounds. No new concerns to address before DC. Please contact IR APP or on call IR MD prior to patient d/c to ensure appropriate follow up plans are in place. Typically patient will follow up with IR clinic 14 days post d/c for repeat imaging/possible drain injection. IR scheduler will contact patient with date/time of appointment. Patient will need to flush drain QD with 5 cc NS, record output QD, dressing changes every 2-3 days or earlier if soiled.   IR will continue to follow - please call with questions or concerns.     Electronically Signed: Laymon Coast, NP 12/22/2023, 11:27 AM   I spent a  total of 15 Minutes at the the patient's bedside AND on the patient's hospital floor or unit, greater than 50% of which was counseling/coordinating care for right transgluteal pelvic drain.

## 2023-12-22 NOTE — Progress Notes (Signed)
 Patient ID: Misty Walsh, female   DOB: 04/09/1990, 33 y.o.   MRN: [id number] 14 Days Post-Op Procedure(s) (LRB): HYSTERECTOMY, ABDOMINAL (N/A)  Misty Walsh is a 33 y.o. female patient.   1. Nausea without vomiting   2. Diarrhea of presumed infectious origin   3. Dysuria   4. SIRS (systemic inflammatory response syndrome) (HCC)   5. Tubo-ovarian abscess     Past Medical History:  Diagnosis Date   Anxiety    Back pain    Chlamydia    Depression med made her navel itching and  made her sleepy so she quit taking them   Diabetes mellitus without complication (HCC)    Edema, lower extremity    Elevated cholesterol    Fibrocystic breast changes of both breasts    GERD (gastroesophageal reflux disease)    History of cesarean section, classical 12/18/2012   2014    Human papilloma virus    Hypertension    Insomnia    Joint pain    Moderate dysplasia of cervix    Numbness    right arm to hand   Ovarian cyst    Prediabetes    Tubo-ovarian abscess 12/02/2023   Vitamin D  deficiency     No past surgical history pertinent negatives on file.  Scheduled Meds:  amLODipine  5 mg Oral Daily   apixaban  10 mg Oral BID   Followed by   NOREEN ON 12/25/2023] apixaban  5 mg Oral BID   atorvastatin  80 mg Oral Daily   chlorthalidone   25 mg Oral Daily   doxycycline   100 mg Oral Q12H   feeding supplement  237 mL Oral QID   gabapentin  300 mg Oral TID   glycopyrrolate   1 mg Oral BID   insulin  aspart  0-15 Units Subcutaneous TID WC   insulin  aspart  0-5 Units Subcutaneous QHS   insulin  glargine-yfgn  10 Units Subcutaneous Daily   losartan  50 mg Oral Daily   metoCLOPramide   5 mg Oral Q8H   metroNIDAZOLE   500 mg Oral Q8H   polyethylene glycol  17 g Oral QODAY   scopolamine   1 patch Transdermal Q72H    Continuous Infusions:  PRN Meds:acetaminophen , alum & mag hydroxide-simeth, diphenhydrAMINE , glycopyrrolate , loperamide, menthol , ondansetron  (ZOFRAN ) IV **OR** [DISCONTINUED]  ondansetron , ondansetron , mouth rinse, oxyCODONE , promethazine , promethazine , senna-docusate  Allergies  Allergen Reactions   Macrobid  [Nitrofurantoin  Macrocrystal] Itching    Caused the patient to feel hot.    Principal Problem:   Sepsis (HCC) Active Problems:   Type 2 diabetes mellitus without complication, without long-term current use of insulin  (HCC)   Tubo-ovarian abscess   BMI 50.0-59.9, adult (HCC)   Obesity, class 3 (HCC)   AKI (acute kidney injury)   Acute hypoxic respiratory failure (HCC)   S/P exploratory laparotomy   Subjective   Doing a bit better Drinking protein shakes better than her solid food, encouraged Ambulating with walker Plans in place for discharge tomorrow  Objective   Vitals:   12/21/23 1950 12/22/23 0414 12/22/23 0814 12/22/23 1128  BP: 122/82 115/73 128/74 139/68  Pulse: (!) 109 (!) 101 94 87  Resp: 18 18 19 19   Temp: 98.3 F (36.8 C) 98.5 F (36.9 C) 98.7 F (37.1 C) 98.4 F (36.9 C)  TempSrc: Oral Oral Oral Oral  SpO2: 97% 95% 99% 98%  Weight:      Height:       Vitals:   12/20/23 1218 12/20/23 1738 12/20/23 1950 12/21/23 0037  BP: 137/77 138/73 (!) 130/92 122/73   12/21/23 0424 12/21/23 0845 12/21/23 1150 12/21/23 1623  BP: 125/75 126/82 133/78 126/85   12/21/23 1950 12/22/23 0414 12/22/23 0814 12/22/23 1128  BP: 122/82 115/73 128/74 139/68     Subjective Objective: Vital signs (most recent): Blood pressure 139/68, pulse 87, temperature 98.4 F (36.9 C), temperature source Oral, resp. rate 19, height 5' 4 (1.626 m), weight 136.1 kg, last menstrual period 11/18/2023, SpO2 98%.   Gen  obese female mild distress Abdomen  soft benign Incision  opened at the top, good granulation no evidence of infection,     Latest Ref Rng & Units 12/22/2023    7:32 AM 12/21/2023    4:23 AM 12/20/2023    9:08 AM  CBC  WBC 4.0 - 10.5 K/uL 9.2  7.7  7.4   Hemoglobin 12.0 - 15.0 g/dL 9.3  8.8  9.0   Hematocrit 36.0 - 46.0 % 28.5  27.2  28.2    Platelets 150 - 400 K/uL 404  405  438        Latest Ref Rng & Units 12/22/2023    4:12 AM 12/21/2023    4:23 AM 12/20/2023    9:08 AM  CMP  Glucose 70 - 99 mg/dL 878  873  855   BUN 6 - 20 mg/dL 10  8  8    Creatinine 0.44 - 1.00 mg/dL 9.01  9.06  9.09   Sodium 135 - 145 mmol/L 136  132  133   Potassium 3.5 - 5.1 mmol/L 4.9  3.7  3.6   Chloride 98 - 111 mmol/L 99  97  98   CO2 22 - 32 mmol/L 22  23  23    Calcium  8.9 - 10.3 mg/dL 8.9  8.6  8.5   Total Protein 6.5 - 8.1 g/dL  6.5    Total Bilirubin 0.0 - 1.2 mg/dL  0.2    Alkaline Phos 38 - 126 U/L  44    AST 15 - 41 U/L  28    ALT 0 - 44 U/L  19       Assessment & Plan  POD#14 washout laparotomy, cultures negative for TOAs Drain in place from IR Wound dressing and team engaged Loading DOAC therapy Continue doxy + flagyl   IR to repeat CT 10-14 days, rec lean 14 Anticipate discharge tomorrow    Vonn VEAR Inch, MD 12/22/2023, 3:39 PM, late entry saw this am with interval assessment this afternoon

## 2023-12-22 NOTE — Plan of Care (Signed)
  Problem: Education: Goal: Ability to describe self-care measures that may prevent or decrease complications (Diabetes Survival Skills Education) will improve Outcome: Progressing Goal: Individualized Educational Video(s) Outcome: Progressing   Problem: Coping: Goal: Ability to adjust to condition or change in health will improve Outcome: Progressing   Problem: Fluid Volume: Goal: Ability to maintain a balanced intake and output will improve Outcome: Progressing   Problem: Health Behavior/Discharge Planning: Goal: Ability to identify and utilize available resources and services will improve Outcome: Progressing Goal: Ability to manage health-related needs will improve Outcome: Progressing   Problem: Metabolic: Goal: Ability to maintain appropriate glucose levels will improve Outcome: Progressing   Problem: Nutritional: Goal: Maintenance of adequate nutrition will improve Outcome: Progressing Goal: Progress toward achieving an optimal weight will improve Outcome: Progressing   Problem: Skin Integrity: Goal: Risk for impaired skin integrity will decrease Outcome: Progressing   Problem: Tissue Perfusion: Goal: Adequacy of tissue perfusion will improve Outcome: Progressing   Problem: Education: Goal: Knowledge of General Education information will improve Description: Including pain rating scale, medication(s)/side effects and non-pharmacologic comfort measures Outcome: Progressing   Problem: Health Behavior/Discharge Planning: Goal: Ability to manage health-related needs will improve Outcome: Progressing   Problem: Clinical Measurements: Goal: Ability to maintain clinical measurements within normal limits will improve Outcome: Progressing Goal: Will remain free from infection Outcome: Progressing Goal: Diagnostic test results will improve Outcome: Progressing Goal: Respiratory complications will improve Outcome: Progressing Goal: Cardiovascular complication will  be avoided Outcome: Progressing   Problem: Activity: Goal: Risk for activity intolerance will decrease Outcome: Progressing   Problem: Nutrition: Goal: Adequate nutrition will be maintained Outcome: Progressing   Problem: Coping: Goal: Level of anxiety will decrease Outcome: Progressing   Problem: Elimination: Goal: Will not experience complications related to urinary retention Outcome: Progressing   Problem: Safety: Goal: Ability to remain free from injury will improve Outcome: Progressing   Problem: Skin Integrity: Goal: Risk for impaired skin integrity will decrease Outcome: Progressing   Problem: Education: Goal: Knowledge of General Education information will improve Description: Including pain rating scale, medication(s)/side effects and non-pharmacologic comfort measures Outcome: Progressing   Problem: Health Behavior/Discharge Planning: Goal: Ability to manage health-related needs will improve Outcome: Progressing   Problem: Clinical Measurements: Goal: Ability to maintain clinical measurements within normal limits will improve Outcome: Progressing Goal: Will remain free from infection Outcome: Progressing Goal: Diagnostic test results will improve Outcome: Progressing Goal: Respiratory complications will improve Outcome: Progressing Goal: Cardiovascular complication will be avoided Outcome: Progressing   Problem: Activity: Goal: Risk for activity intolerance will decrease Outcome: Progressing   Problem: Nutrition: Goal: Adequate nutrition will be maintained Outcome: Progressing   Problem: Coping: Goal: Level of anxiety will decrease Outcome: Progressing   Problem: Elimination: Goal: Will not experience complications related to bowel motility Outcome: Progressing Goal: Will not experience complications related to urinary retention Outcome: Progressing   Problem: Safety: Goal: Ability to remain free from injury will improve Outcome:  Progressing   Problem: Skin Integrity: Goal: Risk for impaired skin integrity will decrease Outcome: Progressing

## 2023-12-22 NOTE — TOC Progression Note (Signed)
 Transition of Care [facility]) - Progression Note    Patient Details  Name: Misty Walsh MRN: [id number] Date of Birth: [birth year]  Transition of Care [facility]) CM/SW Contact  Charlene Julian Daring, RN Phone Number:[phone] 12/22/2023, 12:54 PM  Clinical Narrative:     EX Lap and drainage of tubo-ovarian abscess/pelvic abscess by Dr. Vanderbilt and Dr. Ozan 10/20. Post-operatively, she was found to have PE and DVT and was started on anticoagulation. Repeat CT showed new pelvic abscesses and IR was consulted for drainage. Drains removed 11/1.    Superior aspect of incision opened superficially with depth of 1cm. No evidence of infection was noted.   CM met with patient in room after consult for NPWT. Patient in agreement with plan.She shared she has a group of friends that plan to come and help her after she is discharged and one plans to come day of discharge for teaching in room with RN.  Patient shared that some of them have some medical knowledge. 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 Currently he is being cared for his father that is staying at patient's home and can for a short while while patient is recovering she shared. Patient shared that she will contact Randine Pope with DME company [employer] with referral.  Referral sent to [place] and [place] and Order script signed by provider and faxed back to [facility].  Andrea Skates WOC came to patient's room and placed peel/place dressing on patient's wound with hospital VAC.  CM in touch with [facility] on [address] and provider in hospital today and plan is for patient to follow up for weekly dressing changes at the [facility] on [address] at 10:35 on Monday 11/10. Appointment was given to [facility] by Alyson RN at their office and placed in follow up section for patient. Randine made aware by [facility] on phone of appointment time and she will go to office to assist MD- Dr. Nicholaus with this dressing  change.  Patient is to bring supplies with her to this appointment and they will be shipped to her home this week per Randine.  Expected Discharge Plan and Services Home NPWT Vac- [employer] company - Randine Pope # [phone] Fax#[phone]  MATCH- provided by IP RN CM and TOC will provide discharge medications to patient at side  Rolling walker - bariatric with 5 inch wheels provided by [employer]- Jermaine # [phone]   Social Drivers of Health (SDOH) Interventions SDOH Screenings   Food Insecurity: No Food Insecurity (12/01/2023)  Housing: Low Risk  (12/01/2023)  Transportation Needs: No Transportation Needs (12/01/2023)  Utilities: Not At Risk (12/01/2023)  Depression (PHQ2-9): Low Risk  (08/15/2022)  Financial Resource Strain: Not on File (06/07/2021)   Received from [facility]  Physical Activity: Not on File (06/07/2021)   Received from [facility]  Social Connections: Not on File (11/04/2022)   Received from [facility]  Stress: Not on File (06/07/2021)   Received from [facility]  Tobacco Use: Medium Risk (12/08/2023)    Readmission Risk Interventions     No data to display

## 2023-12-23 ENCOUNTER — Other Ambulatory Visit: Payer: Self-pay | Admitting: Obstetrics & Gynecology

## 2023-12-23 ENCOUNTER — Encounter: Payer: Self-pay | Admitting: Obstetrics & Gynecology

## 2023-12-23 ENCOUNTER — Other Ambulatory Visit (HOSPITAL_COMMUNITY): Payer: Self-pay

## 2023-12-23 DIAGNOSIS — I824Y3 Acute embolism and thrombosis of unspecified deep veins of proximal lower extremity, bilateral: Secondary | ICD-10-CM

## 2023-12-23 LAB — CBC WITH DIFFERENTIAL/PLATELET
Abs Immature Granulocytes: 0.04 K/uL (ref 0.00–0.07)
Basophils Absolute: 0 K/uL (ref 0.0–0.1)
Basophils Relative: 1 %
Eosinophils Absolute: 0.2 K/uL (ref 0.0–0.5)
Eosinophils Relative: 3 %
HCT: 29.9 % — ABNORMAL LOW (ref 36.0–46.0)
Hemoglobin: 9.7 g/dL — ABNORMAL LOW (ref 12.0–15.0)
Immature Granulocytes: 1 %
Lymphocytes Relative: 26 %
Lymphs Abs: 2 K/uL (ref 0.7–4.0)
MCH: 31 pg (ref 26.0–34.0)
MCHC: 32.4 g/dL (ref 30.0–36.0)
MCV: 95.5 fL (ref 80.0–100.0)
Monocytes Absolute: 0.9 K/uL (ref 0.1–1.0)
Monocytes Relative: 11 %
Neutro Abs: 4.5 K/uL (ref 1.7–7.7)
Neutrophils Relative %: 58 %
Platelets: 408 K/uL — ABNORMAL HIGH (ref 150–400)
RBC: 3.13 MIL/uL — ABNORMAL LOW (ref 3.87–5.11)
RDW: 14.5 % (ref 11.5–15.5)
WBC: 7.6 K/uL (ref 4.0–10.5)
nRBC: 0 % (ref 0.0–0.2)

## 2023-12-23 LAB — BASIC METABOLIC PANEL WITH GFR
Anion gap: 14 (ref 5–15)
BUN: 10 mg/dL (ref 6–20)
CO2: 25 mmol/L (ref 22–32)
Calcium: 9.1 mg/dL (ref 8.9–10.3)
Chloride: 97 mmol/L — ABNORMAL LOW (ref 98–111)
Creatinine, Ser: 0.97 mg/dL (ref 0.44–1.00)
GFR, Estimated: >60 mL/min (ref >60)
Glucose, Bld: 142 mg/dL — ABNORMAL HIGH (ref 70–99)
Potassium: 3.6 mmol/L (ref 3.5–5.1)
Sodium: 136 mmol/L (ref 135–145)

## 2023-12-23 MED ORDER — METFORMIN HCL 500 MG PO TABS
500.0000 mg | ORAL_TABLET | Freq: Two times a day (BID) | ORAL | 3 refills | Status: AC
  Filled 2023-12-23: qty 60, 30d supply, fill #0

## 2023-12-23 MED ORDER — SODIUM CHLORIDE 0.9% FLUSH
10.0000 mL | Freq: Every day | INTRAVENOUS | 0 refills | Status: DC
  Filled 2023-12-23: qty 300, 20d supply, fill #0

## 2023-12-23 MED ORDER — APIXABAN 5 MG PO TABS
ORAL_TABLET | ORAL | 3 refills | Status: DC
  Filled 2023-12-23: qty 66, 31d supply, fill #0

## 2023-12-23 MED ORDER — METOCLOPRAMIDE HCL 10 MG PO TABS
10.0000 mg | ORAL_TABLET | Freq: Three times a day (TID) | ORAL | 0 refills | Status: DC
  Filled 2023-12-23: qty 42, 14d supply, fill #0

## 2023-12-23 MED ORDER — APIXABAN 5 MG PO TABS
ORAL_TABLET | ORAL | 3 refills | Status: AC
  Filled 2023-12-24: qty 66, 30d supply, fill #0

## 2023-12-23 NOTE — Progress Notes (Signed)
 Discharge instructions and prescriptions given to pt. Pt did not bring any medications from home to the hospital. Discussed post-op hysterectomy instructions, signs and symptoms to report to the MD, upcoming appointments, and meds. This RN also educated pt and family members on drain care: how to empty it, flush it, and how to change the dressing. Pt and family demonstrated how to properly perform drain care. Pt discharged home from hospital in stable condition.

## 2023-12-23 NOTE — Consult Note (Signed)
 WOC Nurse wound follow up Wound type: surgical  Measurement: see notes from yesterday Wound bed:100% clean Drainage (amount, consistency, odor) scant in the VAC canister Periwound: intact  Dressing procedure/placement/frequency: Peel and Place tubing pulled loose by the patient and/or staff last evening. Patient reported when she is getting up to vomit it was pulled loose.  I have replaced Peel and Place dressing with medium dressing today (again not ideal size) pending delivery of small Peel and Place dressing, however per materials that will not arrive for 1-5 days.  I hooked the patient to the home unit for DC to home. Instructed her that she can not shower until NPWT (Peel and Place) has been discontinued.  If the dressing comes loose, ok to cover with dry dressing and contact the GYN office for visit. Not an emergency.  Orders updated for DC instructions.  Peel and place small dressings to be delivered to the patient's home per GYN.   Yuriel Lopezmartinez [facility], CNS, CWON-AP [phone]

## 2023-12-23 NOTE — Discharge Summary (Signed)
 Physician Discharge Summary  Patient ID: Misty Walsh MRN: [id number] DOB/AGE: November 01, 1990 33 y.o.  Admit date: 12/01/2023 Discharge date: 12/23/2023  Admission Diagnoses: TOA  Discharge Diagnoses:  Principal Problem:   Sepsis (HCC) Active Problems:   Type 2 diabetes mellitus without complication, without long-term current use of insulin  (HCC)   Tubo-ovarian abscess   BMI 50.0-59.9, adult (HCC)   Obesity, class 3 (HCC)   AKI (acute kidney injury)   Acute hypoxic respiratory failure (HCC)   S/P exploratory laparotomy   Discharged Condition: stable  Hospital Course:  Misty Walsh is a 33 y.o. 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 Patient started on IV abx and [facility] IR didn't feel able to drain. She was then transferred to [facility] GYN on 10/17 and IR at [facility] didn't feel able to drain, as well.    She was taken to the OR by GYN and Gen Surg on 10/20 and had a ex-lap via vertical midline/LOA/removal of left adnexal cyst wall/washout/JP drain placement x 2 (left and right posterior gutters) and closed with staples. Operative findings significant for extensive adhesions, difficulty identifying GYN anatomy with large amount of sero-sang fluid during surgery; surg path of cyst wall, cultures and gram stain all negative, as well as final admit blood cultures.    Post op, patient diagnosed with bilateral LE DVTs and PE and started on anti-coagulation.     She also had drainage of another collection by IR on 10/29, and has a transgluteal drain in place.   Staples and surgical drains were removed 11/1. Unfortunately, superior aspect of incision opened superficially with depth of 1cm. No evidence of infection was noted.   Consults: Gen surgery, IR, wound care, PT, hospitalist  Significant Diagnostic Studies: labs: , microbiology: blood culture: negative, urine culture:  negative, and wound culture: negative, and radiology: IR  Treatments: surgical washout  Discharge Exam: Blood pressure 122/87, pulse 97, temperature 98.3 F (36.8 C), temperature source Oral, resp. rate 18, height 5' 4 (1.626 m), weight 136.1 kg, last menstrual period 11/18/2023, SpO2 93%. General appearance: obese female NAD GI: soft normal post op dressing in place Incision/Wound:no infection good granulation tissue  Disposition: Discharge disposition: 01-Home or Self Care       Discharge Instructions     Call MD for:  persistant nausea and vomiting   Complete by: As directed    Call MD for:  severe uncontrolled pain   Complete by: As directed    Call MD for:  temperature >100.4   Complete by: As directed    Diet - low sodium heart healthy   Complete by: As directed    Discharge wound care:   Complete by: As directed    Per wound care team   Driving Restrictions   Complete by: As directed    No driving   Increase activity slowly   Complete by: As directed    Lifting restrictions   Complete by: As directed    No lifting   Sexual Activity Restrictions   Complete by: As directed    No sex for 12 weeks      Allergies as of 12/23/2023       Reactions   Macrobid  [nitrofurantoin  Macrocrystal] Itching   Caused the patient to feel hot.        Medication List     STOP taking these medications    acetaminophen  500 MG  tablet Commonly known as: TYLENOL    Allergy Relief 180 MG tablet Generic drug: fexofenadine    cefdinir  300 MG capsule Commonly known as: OMNICEF        TAKE these medications    amLODipine 5 MG tablet Commonly known as: NORVASC Take 1 tablet (5 mg total) by mouth daily.   apixaban 5 MG Tabs tablet Commonly known as: ELIQUIS 2 tablets twice a day for 3 doses then start 1 tablet twice a day thereafter   atorvastatin 80 MG tablet Commonly known as: LIPITOR Take 1 tablet (80 mg total) by mouth daily.   chlorthalidone  25 MG  tablet Commonly known as: HYGROTON  Take 1 tablet (25 mg total) by mouth daily.   glycopyrrolate  1 MG tablet Commonly known as: ROBINUL  Take 1 tablet (1 mg total) by mouth 3 (three) times daily.   losartan 50 MG tablet Commonly known as: COZAAR Take 1 tablet (50 mg total) by mouth daily.   metFORMIN 500 MG tablet Commonly known as: GLUCOPHAGE Take 1 tablet (500 mg total) by mouth 2 (two) times daily with a meal.   metoCLOPramide  10 MG tablet Commonly known as: REGLAN  Take 1 tablet (10 mg total) by mouth every 8 (eight) hours for 14 days.   ondansetron  4 MG tablet Commonly known as: ZOFRAN  Take 1 tablet (4 mg total) by mouth every 6 (six) hours as needed for nausea or vomiting.   promethazine  25 MG tablet Commonly known as: PHENERGAN  Take 1 tablet (25 mg total) by mouth every 6 (six) hours as needed for refractory nausea / vomiting.   scopolamine  1 MG/3DAYS Commonly known as: TRANSDERM-SCOP Place 1 patch (1 mg total) onto the skin every 3 (three) days.   sodium chloride  flush 0.9 % Soln Commonly known as: NS Place 10 mLs into feeding tube daily AND flush drain once daily with 5 mL saline from prefilled syringe.       ASK your doctor about these medications    oxyCODONE  5 MG immediate release tablet Commonly known as: Oxy IR/ROXICODONE  Take 1-2 tablets (5-10 mg total) by mouth every 6 (six) hours as needed for up to 7 days for moderate pain (pain score 4-6) or severe pain (pain score 7-10). Ask about: Should I take this medication?               Durable Medical Equipment  (From admission, onward)           Start     Ordered   12/10/23 1046  For home use only DME Walker rolling  Once       Question Answer Comment  Walker: With 5 Inch Wheels   Patient needs a walker to treat with the following condition Limited mobility      12/10/23 1045              Discharge Care Instructions  (From admission, onward)           Start     Ordered    12/23/23 0000  Discharge wound care:       Comments: Per wound care team   12/23/23 1201            Follow-up Information     [facility] Follow up.   Why: call for a follow up appointment- primary care doctor Contact information: [address] [place], [place] [address] phone # [phone]        Center for [facility] Follow up on  12/29/2023.   Specialty: Obstetrics and Gynecology Why: appointment Monday 11/10 at 10:35 with Dr. Nicholaus- bring wound vac supplies to this appointment. Randine with wound vac [employer] will come to this appointment for assist . Contact information: [address] [place]  [address] [phone]                Signed: Vonn VEAR Inch 12/23/2023, 12:05 PM

## 2023-12-23 NOTE — TOC Transition Note (Addendum)
 Transition of Care [facility]) - Discharge Note   Patient Details  Name: Misty Walsh MRN: [id number] Date of Birth: [birth year]  Transition of [facility] CM/SW Contact:  Charlene Julian Daring, RN Phone Number:[phone] 12/23/2023, 11:14 AM   Clinical Narrative:      Superior aspect of incision opened superficially with depth of 1cm. No evidence of infection was noted - wound vac needed for home-  peel/stick  CM called [employer] 2nd attempt # [phone] and left detailed message regarding follow up for this patient and insurance. Financial counselor spoke to [facility] and shared that this patient has been out source to [employer].     Barriers to Discharge: Continued Medical Work up, Inadequate or no insurance      Discharge Placement Home with DME-  Rolling Walker- [employer]- Bariatric  62M[employer]- wound home vac- peel and place. Vac delivered to room by [facility] on 11/4 and RN in room also and given paper work to review with patient at discharge. Patient signed POD and RNCM faxed this to [phone]. Randine notified fax sent and that address on form incorrect. Correct address of patient is: [address] [place], [place] [address]. This address is patient's correct address and is in epic.  Patient instructed to take peel and stick that will be delivered to patient's home this week to the patient's follow up appointment on Monday 11/10 at 10:35. Patient verbalized understanding.  Patient is having friend come today for teaching in room for discharge she shared with CM and RN on unit. WOC RN to come to unit today and place home unit of wound vac prior to discharge.   Discharge Plan and Services  Patient will follow up with Radiology outpatient for drain removal and follow up and NP with them shared they will follow up with patient for an appointment.  Patient will follow up with PCP as needed  Patient will follow up with [facility] on 11/10 at 10:35 for visit with Dr. Nicholaus- wound care change and Randine Pope with [employer] liaison will be there # [phone]    Social Drivers of Health (SDOH) Interventions SDOH Screenings   Food Insecurity: No Food Insecurity (12/01/2023)  Housing: Low Risk  (12/01/2023)  Transportation Needs: No Transportation Needs (12/01/2023)  Utilities: Not At Risk (12/01/2023)  Depression (PHQ2-9): Low Risk  (08/15/2022)  Financial Resource Strain: Not on File (06/07/2021)   Received from [facility]  Physical Activity: Not on File (06/07/2021)   Received from [facility]  Social Connections: Not on File (11/04/2022)   Received from [facility]  Stress: Not on File (06/07/2021)   Received from [facility]  Tobacco Use: Medium Risk (12/08/2023)     Readmission Risk Interventions     No data to display

## 2023-12-23 NOTE — Progress Notes (Signed)
 Pt had requested pain medication at this time. This RN scanned 10 mg oxycodone  and noticed that pt had a dose of 10 mg oxycodone  sitting on the pt's bedside table. Pt stated that the night nurse had handed her the medication per pt's request, but the pt had ended up not taking that dose. That 10mg  of oxycodone  was wasted in the [employer] with Charlies Farr, RN.

## 2023-12-24 ENCOUNTER — Other Ambulatory Visit (HOSPITAL_COMMUNITY): Payer: Self-pay

## 2023-12-24 ENCOUNTER — Other Ambulatory Visit: Payer: Self-pay

## 2023-12-24 ENCOUNTER — Ambulatory Visit: Payer: Self-pay | Admitting: Obstetrics and Gynecology

## 2023-12-24 ENCOUNTER — Encounter: Payer: Self-pay | Admitting: Obstetrics and Gynecology

## 2023-12-25 ENCOUNTER — Encounter: Payer: Self-pay | Admitting: General Surgery

## 2023-12-25 DIAGNOSIS — N7093 Salpingitis and oophoritis, unspecified: Secondary | ICD-10-CM

## 2023-12-25 NOTE — Progress Notes (Signed)
 Patient did not keep her GYN appointment for 12/24/2023.  Bebe Izell Raddle MD Attending Center for [employer] [facility])

## 2023-12-26 ENCOUNTER — Inpatient Hospital Stay (HOSPITAL_COMMUNITY): Payer: MEDICAID

## 2023-12-26 ENCOUNTER — Inpatient Hospital Stay (HOSPITAL_COMMUNITY)
Admission: AD | Admit: 2023-12-26 | Discharge: 2024-01-08 | DRG: 391 | Disposition: A | Discharge status: Home / Self Care | Payer: MEDICAID | Attending: Obstetrics & Gynecology | Admitting: Obstetrics & Gynecology

## 2023-12-26 ENCOUNTER — Encounter (HOSPITAL_COMMUNITY): Payer: Self-pay | Admitting: Obstetrics & Gynecology

## 2023-12-26 ENCOUNTER — Other Ambulatory Visit: Payer: Self-pay

## 2023-12-26 ENCOUNTER — Telehealth: Payer: Self-pay

## 2023-12-26 ENCOUNTER — Encounter: Payer: Self-pay | Admitting: General Surgery

## 2023-12-26 ENCOUNTER — Other Ambulatory Visit (HOSPITAL_COMMUNITY): Payer: Self-pay

## 2023-12-26 DIAGNOSIS — K449 Diaphragmatic hernia without obstruction or gangrene: Secondary | ICD-10-CM | POA: Diagnosis not present

## 2023-12-26 DIAGNOSIS — N739 Female pelvic inflammatory disease, unspecified: Secondary | ICD-10-CM

## 2023-12-26 DIAGNOSIS — Z7984 Long term (current) use of oral hypoglycemic drugs: Secondary | ICD-10-CM

## 2023-12-26 DIAGNOSIS — D649 Anemia, unspecified: Secondary | ICD-10-CM | POA: Diagnosis not present

## 2023-12-26 DIAGNOSIS — K660 Peritoneal adhesions (postprocedural) (postinfection): Secondary | ICD-10-CM | POA: Diagnosis present

## 2023-12-26 DIAGNOSIS — E119 Type 2 diabetes mellitus without complications: Secondary | ICD-10-CM | POA: Diagnosis present

## 2023-12-26 DIAGNOSIS — X58XXXD Exposure to other specified factors, subsequent encounter: Secondary | ICD-10-CM | POA: Diagnosis not present

## 2023-12-26 DIAGNOSIS — Z87891 Personal history of nicotine dependence: Secondary | ICD-10-CM | POA: Diagnosis not present

## 2023-12-26 DIAGNOSIS — E785 Hyperlipidemia, unspecified: Secondary | ICD-10-CM | POA: Diagnosis present

## 2023-12-26 DIAGNOSIS — K3189 Other diseases of stomach and duodenum: Secondary | ICD-10-CM | POA: Diagnosis present

## 2023-12-26 DIAGNOSIS — N1339 Other hydronephrosis: Secondary | ICD-10-CM

## 2023-12-26 DIAGNOSIS — Z9889 Other specified postprocedural states: Secondary | ICD-10-CM

## 2023-12-26 DIAGNOSIS — F411 Generalized anxiety disorder: Secondary | ICD-10-CM | POA: Diagnosis present

## 2023-12-26 DIAGNOSIS — I82403 Acute embolism and thrombosis of unspecified deep veins of lower extremity, bilateral: Secondary | ICD-10-CM | POA: Diagnosis present

## 2023-12-26 DIAGNOSIS — E860 Dehydration: Secondary | ICD-10-CM | POA: Diagnosis present

## 2023-12-26 DIAGNOSIS — I10 Essential (primary) hypertension: Secondary | ICD-10-CM | POA: Diagnosis present

## 2023-12-26 DIAGNOSIS — N1330 Unspecified hydronephrosis: Secondary | ICD-10-CM | POA: Diagnosis present

## 2023-12-26 DIAGNOSIS — R188 Other ascites: Secondary | ICD-10-CM | POA: Diagnosis present

## 2023-12-26 DIAGNOSIS — K651 Peritoneal abscess: Secondary | ICD-10-CM | POA: Diagnosis present

## 2023-12-26 DIAGNOSIS — Z833 Family history of diabetes mellitus: Secondary | ICD-10-CM

## 2023-12-26 DIAGNOSIS — Z794 Long term (current) use of insulin: Secondary | ICD-10-CM | POA: Diagnosis not present

## 2023-12-26 DIAGNOSIS — G4700 Insomnia, unspecified: Secondary | ICD-10-CM | POA: Diagnosis present

## 2023-12-26 DIAGNOSIS — D500 Iron deficiency anemia secondary to blood loss (chronic): Secondary | ICD-10-CM | POA: Diagnosis present

## 2023-12-26 DIAGNOSIS — I82409 Acute embolism and thrombosis of unspecified deep veins of unspecified lower extremity: Secondary | ICD-10-CM | POA: Diagnosis not present

## 2023-12-26 DIAGNOSIS — Z7901 Long term (current) use of anticoagulants: Secondary | ICD-10-CM

## 2023-12-26 DIAGNOSIS — N179 Acute kidney failure, unspecified: Secondary | ICD-10-CM | POA: Diagnosis present

## 2023-12-26 DIAGNOSIS — F4329 Adjustment disorder with other symptoms: Secondary | ICD-10-CM | POA: Diagnosis not present

## 2023-12-26 DIAGNOSIS — K9189 Other postprocedural complications and disorders of digestive system: Secondary | ICD-10-CM | POA: Diagnosis present

## 2023-12-26 DIAGNOSIS — I2699 Other pulmonary embolism without acute cor pulmonale: Secondary | ICD-10-CM | POA: Diagnosis present

## 2023-12-26 DIAGNOSIS — S31109A Unspecified open wound of abdominal wall, unspecified quadrant without penetration into peritoneal cavity, initial encounter: Secondary | ICD-10-CM | POA: Diagnosis present

## 2023-12-26 DIAGNOSIS — L299 Pruritus, unspecified: Secondary | ICD-10-CM | POA: Diagnosis present

## 2023-12-26 DIAGNOSIS — Z765 Malingerer [conscious simulation]: Secondary | ICD-10-CM | POA: Diagnosis not present

## 2023-12-26 DIAGNOSIS — G8929 Other chronic pain: Secondary | ICD-10-CM

## 2023-12-26 DIAGNOSIS — R52 Pain, unspecified: Secondary | ICD-10-CM | POA: Diagnosis not present

## 2023-12-26 DIAGNOSIS — Z79899 Other long term (current) drug therapy: Secondary | ICD-10-CM

## 2023-12-26 DIAGNOSIS — S31109D Unspecified open wound of abdominal wall, unspecified quadrant without penetration into peritoneal cavity, subsequent encounter: Secondary | ICD-10-CM | POA: Diagnosis not present

## 2023-12-26 DIAGNOSIS — N7093 Salpingitis and oophoritis, unspecified: Secondary | ICD-10-CM | POA: Diagnosis present

## 2023-12-26 DIAGNOSIS — E876 Hypokalemia: Secondary | ICD-10-CM | POA: Diagnosis present

## 2023-12-26 DIAGNOSIS — K567 Ileus, unspecified: Secondary | ICD-10-CM | POA: Diagnosis present

## 2023-12-26 DIAGNOSIS — E66813 Obesity, class 3: Secondary | ICD-10-CM | POA: Diagnosis present

## 2023-12-26 DIAGNOSIS — R112 Nausea with vomiting, unspecified: Principal | ICD-10-CM | POA: Diagnosis present

## 2023-12-26 DIAGNOSIS — M7989 Other specified soft tissue disorders: Secondary | ICD-10-CM | POA: Diagnosis not present

## 2023-12-26 DIAGNOSIS — Z723 Lack of physical exercise: Secondary | ICD-10-CM

## 2023-12-26 DIAGNOSIS — Z818 Family history of other mental and behavioral disorders: Secondary | ICD-10-CM

## 2023-12-26 DIAGNOSIS — Z6841 Body Mass Index (BMI) 40.0 and over, adult: Secondary | ICD-10-CM | POA: Diagnosis not present

## 2023-12-26 DIAGNOSIS — F331 Major depressive disorder, recurrent, moderate: Secondary | ICD-10-CM | POA: Diagnosis present

## 2023-12-26 DIAGNOSIS — K319 Disease of stomach and duodenum, unspecified: Secondary | ICD-10-CM

## 2023-12-26 DIAGNOSIS — Z91148 Patient's other noncompliance with medication regimen for other reason: Secondary | ICD-10-CM

## 2023-12-26 DIAGNOSIS — Z8249 Family history of ischemic heart disease and other diseases of the circulatory system: Secondary | ICD-10-CM

## 2023-12-26 DIAGNOSIS — K219 Gastro-esophageal reflux disease without esophagitis: Secondary | ICD-10-CM | POA: Diagnosis present

## 2023-12-26 DIAGNOSIS — F329 Major depressive disorder, single episode, unspecified: Secondary | ICD-10-CM | POA: Diagnosis not present

## 2023-12-26 DIAGNOSIS — Z555 Less than a high school diploma: Secondary | ICD-10-CM

## 2023-12-26 LAB — HEPATIC FUNCTION PANEL
ALT: 21 U/L (ref 0–44)
AST: 36 U/L (ref 15–41)
Albumin: 3.5 g/dL (ref 3.5–5.0)
Alkaline Phosphatase: 58 U/L (ref 38–126)
Bilirubin, Direct: 0.3 mg/dL — ABNORMAL HIGH (ref 0.0–0.2)
Indirect Bilirubin: 0.7 mg/dL (ref 0.3–0.9)
Total Bilirubin: 1 mg/dL (ref 0.0–1.2)
Total Protein: 7.9 g/dL (ref 6.5–8.1)

## 2023-12-26 LAB — CBC WITH DIFFERENTIAL/PLATELET
Abs Immature Granulocytes: 0.02 K/uL (ref 0.00–0.07)
Basophils Absolute: 0.1 K/uL (ref 0.0–0.1)
Basophils Relative: 1 %
Eosinophils Absolute: 0.1 K/uL (ref 0.0–0.5)
Eosinophils Relative: 1 %
HCT: 34.8 % — ABNORMAL LOW (ref 36.0–46.0)
Hemoglobin: 11.4 g/dL — ABNORMAL LOW (ref 12.0–15.0)
Immature Granulocytes: 0 %
Lymphocytes Relative: 27 %
Lymphs Abs: 1.9 K/uL (ref 0.7–4.0)
MCH: 30.3 pg (ref 26.0–34.0)
MCHC: 32.8 g/dL (ref 30.0–36.0)
MCV: 92.6 fL (ref 80.0–100.0)
Monocytes Absolute: 0.7 K/uL (ref 0.1–1.0)
Monocytes Relative: 9 %
Neutro Abs: 4.5 K/uL (ref 1.7–7.7)
Neutrophils Relative %: 62 %
Platelets: 448 K/uL — ABNORMAL HIGH (ref 150–400)
RBC: 3.76 MIL/uL — ABNORMAL LOW (ref 3.87–5.11)
RDW: 14.1 % (ref 11.5–15.5)
WBC: 7.2 K/uL (ref 4.0–10.5)
nRBC: 0 % (ref 0.0–0.2)

## 2023-12-26 LAB — BASIC METABOLIC PANEL WITH GFR
Anion gap: 20 — ABNORMAL HIGH (ref 5–15)
BUN: 10 mg/dL (ref 6–20)
CO2: 20 mmol/L — ABNORMAL LOW (ref 22–32)
Calcium: 9.3 mg/dL (ref 8.9–10.3)
Chloride: 96 mmol/L — ABNORMAL LOW (ref 98–111)
Creatinine, Ser: 1.12 mg/dL — ABNORMAL HIGH (ref 0.44–1.00)
GFR, Estimated: >60 mL/min (ref >60)
Glucose, Bld: 164 mg/dL — ABNORMAL HIGH (ref 70–99)
Potassium: 3 mmol/L — ABNORMAL LOW (ref 3.5–5.1)
Sodium: 136 mmol/L (ref 135–145)

## 2023-12-26 LAB — GLUCOSE, CAPILLARY
Glucose-Capillary: 147 mg/dL — ABNORMAL HIGH (ref 70–99)
Glucose-Capillary: 116 mg/dL — ABNORMAL HIGH (ref 70–99)

## 2023-12-26 LAB — TSH: TSH: 3.17 u[IU]/mL (ref 0.350–4.500)

## 2023-12-26 LAB — MAGNESIUM: Magnesium: 2.2 mg/dL (ref 1.7–2.4)

## 2023-12-26 LAB — LIPASE, BLOOD: Lipase: 39 U/L (ref 11–51)

## 2023-12-26 MED ORDER — PANTOPRAZOLE SODIUM 40 MG IV SOLR
40.0000 mg | INTRAVENOUS | Status: DC
  Administered 2023-12-26: 40 mg via INTRAVENOUS
  Filled 2023-12-26: qty 10

## 2023-12-26 MED ORDER — IOHEXOL 350 MG/ML SOLN
75.0000 mL | Freq: Once | INTRAVENOUS | Status: AC | PRN
  Administered 2023-12-26: 75 mL via INTRAVENOUS

## 2023-12-26 MED ORDER — APIXABAN 5 MG PO TABS
5.0000 mg | ORAL_TABLET | Freq: Two times a day (BID) | ORAL | Status: DC
  Administered 2023-12-26 – 2023-12-27 (×2): 5 mg via ORAL
  Filled 2023-12-26 (×3): qty 1

## 2023-12-26 MED ORDER — GUAIFENESIN 100 MG/5ML PO LIQD: 15.0000 mL | ORAL | Status: DC | PRN

## 2023-12-26 MED ORDER — HYDROMORPHONE HCL 1 MG/ML IJ SOLN
1.0000 mg | Freq: Once | INTRAMUSCULAR | Status: AC
  Administered 2023-12-26: 1 mg via INTRAVENOUS
  Filled 2023-12-26: qty 1

## 2023-12-26 MED ORDER — INSULIN ASPART 100 UNIT/ML IJ SOLN
0.0000 [IU] | INTRAMUSCULAR | Status: DC
  Administered 2023-12-27 – 2023-12-29 (×3): 3 [IU] via SUBCUTANEOUS

## 2023-12-26 MED ORDER — ONDANSETRON HCL 4 MG/2ML IJ SOLN
4.0000 mg | Freq: Four times a day (QID) | INTRAMUSCULAR | Status: DC
  Administered 2023-12-26 – 2023-12-30 (×15): 4 mg via INTRAVENOUS
  Filled 2023-12-26 (×15): qty 2

## 2023-12-26 MED ORDER — HYDROMORPHONE HCL 1 MG/ML IJ SOLN
0.2000 mg | INTRAMUSCULAR | Status: DC | PRN
  Administered 2023-12-26 – 2023-12-30 (×15): 0.6 mg via INTRAVENOUS
  Administered 2023-12-30: 0.2 mg via INTRAVENOUS
  Administered 2023-12-31 (×2): 0.6 mg via INTRAVENOUS
  Administered 2024-01-01: 0.5 mg via INTRAVENOUS
  Administered 2024-01-02 – 2024-01-06 (×11): 0.6 mg via INTRAVENOUS
  Filled 2023-12-26 (×31): qty 1

## 2023-12-26 MED ORDER — ONDANSETRON HCL 4 MG/2ML IJ SOLN
4.0000 mg | Freq: Once | INTRAMUSCULAR | Status: AC
  Administered 2023-12-26: 4 mg via INTRAVENOUS
  Filled 2023-12-26: qty 2

## 2023-12-26 MED ORDER — GLYCOPYRROLATE 0.2 MG/ML IJ SOLN
0.2000 mg | Freq: Once | INTRAMUSCULAR | Status: AC
  Administered 2023-12-26: 0.2 mg via INTRAVENOUS
  Filled 2023-12-26: qty 1

## 2023-12-26 MED ORDER — GLYCOPYRROLATE 0.2 MG/ML IJ SOLN
0.1000 mg | Freq: Three times a day (TID) | INTRAMUSCULAR | Status: DC
  Administered 2023-12-26 – 2023-12-29 (×8): 0.1 mg via INTRAVENOUS
  Filled 2023-12-26 (×10): qty 1

## 2023-12-26 MED ORDER — LACTATED RINGERS IV SOLN: INTRAVENOUS | Status: DC

## 2023-12-26 MED ORDER — SCOPOLAMINE 1 MG/3DAYS TD PT72
1.0000 | MEDICATED_PATCH | TRANSDERMAL | Status: DC
  Administered 2023-12-26 – 2024-01-07 (×5): 1 mg via TRANSDERMAL
  Filled 2023-12-26 (×5): qty 1

## 2023-12-26 MED ORDER — MENTHOL 3 MG MT LOZG: 1.0000 | LOZENGE | OROMUCOSAL | Status: DC | PRN

## 2023-12-26 MED ORDER — METOCLOPRAMIDE HCL 5 MG/ML IJ SOLN
10.0000 mg | Freq: Four times a day (QID) | INTRAMUSCULAR | Status: DC
  Administered 2023-12-26 – 2024-01-04 (×34): 10 mg via INTRAVENOUS
  Filled 2023-12-26 (×35): qty 2

## 2023-12-26 MED ORDER — POLYETHYLENE GLYCOL 3350 17 G PO PACK: 17.0000 g | PACK | Freq: Every day | ORAL | Status: DC | PRN

## 2023-12-26 MED ORDER — PROCHLORPERAZINE EDISYLATE 10 MG/2ML IJ SOLN
10.0000 mg | Freq: Four times a day (QID) | INTRAMUSCULAR | Status: DC | PRN
  Administered 2023-12-27 – 2024-01-08 (×17): 10 mg via INTRAVENOUS
  Filled 2023-12-26 (×21): qty 2

## 2023-12-26 MED ORDER — POTASSIUM CHLORIDE 10 MEQ/100ML IV SOLN
10.0000 meq | INTRAVENOUS | Status: AC
  Administered 2023-12-26 – 2023-12-27 (×4): 10 meq via INTRAVENOUS
  Filled 2023-12-26 (×4): qty 100

## 2023-12-26 MED ORDER — METOPROLOL TARTRATE 5 MG/5ML IV SOLN: 5.0000 mg | INTRAVENOUS | Status: DC | PRN

## 2023-12-26 NOTE — MAU Provider Note (Signed)
 History     CSN: [id number]  Arrival date and time: 12/26/23 1352   None     Chief Complaint  Patient presents with   Abdominal Pain   Emesis   Leg Pain   Misty Walsh , a  33 y.o. G1P0101 currently not pregnant presents to MAU with complaints of severe abdominal pain, nausea and vomiting. Patient states that she has been unable to keep anything down for days. She reports that her incision has opened and she has been vomiting so much that she displaced her wound vac. Patient states that she has not taken any of her medication because she cannot keep it down. She reports vomiting red/black stuff. Patient is currently s/p 18 days post op from a TOA and Vertical Ex lap that resulted in a abdominal hysterectomy.   She also has a hx of DVTs.      Abdominal Pain Associated symptoms include nausea and vomiting. Pertinent negatives include no constipation, diarrhea, dysuria, fever or headaches.  Emesis  Associated symptoms include abdominal pain and chills. Pertinent negatives include no chest pain, diarrhea, fever or headaches.  Leg Pain     OB History     Gravida  1   Para  1   Term      Preterm  1   AB      Living  1      SAB      IAB      Ectopic      Multiple      Live Births  1           Past Medical History:  Diagnosis Date   Anxiety    Back pain    Chlamydia    Depression med made her navel itching and  made her sleepy so she quit taking them   Diabetes mellitus without complication (HCC)    Edema, lower extremity    Elevated cholesterol    Fibrocystic breast changes of both breasts    GERD (gastroesophageal reflux disease)    History of cesarean section, classical 12/18/2012   2014    Human papilloma virus    Hypertension    Insomnia    Joint pain    Moderate dysplasia of cervix    Numbness    right arm to hand   Ovarian cyst    Prediabetes    Tubo-ovarian abscess 12/02/2023   Vitamin D  deficiency     Past Surgical History:   Procedure Laterality Date   ABDOMINAL HYSTERECTOMY N/A 12/08/2023   Procedure: HYSTERECTOMY, ABDOMINAL;  Surgeon: Marilynn Nest, DO;  Location: [facility] OR;  Service: Gynecology;  Laterality: N/A;  W/ EXPLORATORY LAPAROTOMY   BREAST BIOPSY Left 2012   benign fibroadeoma   BREAST SURGERY     CESAREAN SECTION N/A 12/13/2012   Procedure: PRIMARY CESAREAN SECTION;  Surgeon: Aida DELENA Na, MD;  Location: [facility] ORS;  Service: Obstetrics;  Laterality: N/A;   LEEP      Family History  Problem Relation Age of Onset   Diabetes Mother    Heart disease Mother    Hypertension Mother    Depression Mother    Stroke Mother    Arthritis Mother    Anxiety disorder Mother    Stroke Father    High blood pressure Father    High Cholesterol Father    Depression Father    Anxiety disorder Father    Obesity Father    Bipolar disorder Father    Asthma Sister  Kidney disease Brother        genetic condition   Hypertension Maternal Grandmother    Dementia Maternal Grandmother    Heart disease Maternal Grandmother    Diabetes Paternal Grandmother    Diabetes Maternal Aunt    Hypertension Maternal Aunt    Asthma Maternal Aunt    Epilepsy Maternal Aunt     Social History   Tobacco Use   Smoking status: Former    Current packs/day: 0.00    Average packs/day: 0.3 packs/day for 5.0 years (1.3 ttl pk-yrs)    Types: Cigarettes    Start date: 12/20/2014    Quit date: 12/20/2019    Years since quitting: 4.0   Smokeless tobacco: Never  Vaping Use   Vaping status: Never Used  Substance Use Topics   Alcohol use: Not Currently    Comment: occ   Drug use: No    Allergies:  Allergies  Allergen Reactions   Macrobid  [Nitrofurantoin  Macrocrystal] Itching    Caused the patient to feel hot.    Medications Prior to Admission  Medication Sig Dispense Refill Last Dose/Taking   amLODipine (NORVASC) 5 MG tablet Take 1 tablet (5 mg total) by mouth daily. 30 tablet 0    apixaban (ELIQUIS) 5 MG TABS  tablet Take 2 tablets by mouth twice a day for 3 doses then start 1 tablet twice a day thereafter 66 tablet 3    atorvastatin (LIPITOR) 80 MG tablet Take 1 tablet (80 mg total) by mouth daily. 30 tablet 0    chlorthalidone  (HYGROTON ) 25 MG tablet Take 1 tablet (25 mg total) by mouth daily. 60 tablet 0    glycopyrrolate  (ROBINUL ) 1 MG tablet Take 1 tablet (1 mg total) by mouth 3 (three) times daily. 90 tablet 0    losartan (COZAAR) 50 MG tablet Take 1 tablet (50 mg total) by mouth daily. 60 tablet 0    metFORMIN (GLUCOPHAGE) 500 MG tablet Take 1 tablet (500 mg total) by mouth 2 (two) times daily with a meal. 60 tablet 3    metoCLOPramide  (REGLAN ) 10 MG tablet Take 1 tablet (10 mg total) by mouth every 8 (eight) hours for 14 days. 42 tablet 0    ondansetron  (ZOFRAN ) 4 MG tablet Take 1 tablet (4 mg total) by mouth every 6 (six) hours as needed for nausea or vomiting. 20 tablet 0    promethazine  (PHENERGAN ) 25 MG tablet Take 1 tablet (25 mg total) by mouth every 6 (six) hours as needed for refractory nausea / vomiting. 30 tablet 0    scopolamine  (TRANSDERM-SCOP) 1 MG/3DAYS Place 1 patch (1 mg total) onto the skin every 3 (three) days. 10 patch 0    sodium chloride  flush (NS) 0.9 % SOLN Place 10 mLs into feeding tube daily AND flush drain once daily with 5 mL saline from prefilled syringe. 600 mL 0     Review of Systems  Constitutional:  Positive for chills. Negative for fatigue and fever.  Eyes:  Negative for pain and visual disturbance.  Respiratory:  Negative for apnea, shortness of breath and wheezing.   Cardiovascular:  Negative for chest pain and palpitations.  Gastrointestinal:  Positive for abdominal pain, nausea and vomiting. Negative for constipation and diarrhea.  Genitourinary:  Negative for difficulty urinating, dysuria, pelvic pain, vaginal bleeding, vaginal discharge and vaginal pain.  Musculoskeletal:  Negative for back pain.  Neurological:  Negative for seizures, weakness and  headaches.  Psychiatric/Behavioral:  Negative for suicidal ideas.    Physical Exam  Blood pressure 138/89, temperature 98.8 F (37.1 C), temperature source Oral, resp. rate 17, last menstrual period 11/18/2023.  Physical Exam Vitals and nursing note reviewed.  Constitutional:      General: She is in acute distress.     Appearance: Normal appearance. She is obese. She is ill-appearing.     Comments: Patient actively vomiting.   HENT:     Head: Normocephalic.  Pulmonary:     Effort: Pulmonary effort is normal.  Abdominal:     Tenderness: There is abdominal tenderness. There is guarding.     Comments: Wound vac in place, not currently hooked up to suction  Musculoskeletal:     Cervical back: Normal range of motion.  Skin:    General: Skin is warm and dry.  Neurological:     Mental Status: She is alert and oriented to person, place, and time.  Psychiatric:        Mood and Affect: Mood normal.     MAU Course  Procedures Orders Placed This Encounter  Procedures   DG Abdomen 1 View   CT ABDOMEN WO CONTRAST   CBC with Differential/Platelet   Basic metabolic panel   Glucose, capillary   Insert peripheral IV   Meds ordered this encounter  Medications   ondansetron  (ZOFRAN ) injection 4 mg   glycopyrrolate  (ROBINUL ) injection 0.2 mg   HYDROmorphone  (DILAUDID ) injection 1 mg    Refill:  0    MDM - Labs collected.  - IV team has been called for patient IV  - plan to IV fluid, antiemetics and pain management. Orders also placed to rule out SBO.  - Consulted MD to come down to see patient.  - Dr. Eveline down to bedside  - Care assumed by Dr. Fredirick MD at 1700.    Assessment and Plan    Claris CHRISTELLA Cedar, MSN CNM  12/26/2023, 3:43 PM

## 2023-12-26 NOTE — H&P (Addendum)
 Misty Walsh is an 33 y.o. female. G1P0101 Patient's last menstrual period was 11/18/2023 (exact date). Patient had a recurrent pelvic abscess and was admitted for management of sepsis with pelvic abscess (20 cm) on 12/01/23. IR was unable to drain the abscess and exploratory laparotomy by GYN and general surgery on 10/20 with removal of cyst wall, lysis of adhesions and drains were placed.  Other findings were notable for difficulty in identifying GYN anatomy and a large amount of serosanguineous fluid.  Postoperatively patient had intractable nausea and vomiting.  The patient was on IV antibiotics for some time.  All cultures were negative.  She was switched to p.o. antibiotics which have been completed.  The patient developed significant shortness of breath and hypoxia and was found to have bilateral lower extremity DVTs as well as a pulmonary embolism and started on anticoagulation during prior hospitalization.  Due to continued plateauing of her WBC repeat imaging was performed and showed a continued large abscess in the posterior pelvis.  Subsequently a drain was placed by IR on 10/29 and this is still in place.  The patient continued to complain of significant nausea and vomiting during her postoperative course as well as spitting up until approximately 2 days prior to discharge.  At that time she was able to tolerate some p.o. and was no longer requiring IV antiemetics.  After staple removal 11 days postoperatively, her wound separated and a negative pressure wound therapy was begun.  After remaining afebrile, she was discharged on 11/4.  After discharge the patient reported significant nausea and emesis and spitting and inability to keep anything down and lower abdominal pain.  She has had mild hematemesis as well.  She reports being unable to keep any of her medications down and having emesis so strong that she disconnected her wound VAC twice. She reported this at her clinic visit today and she was  instructed to go to ED.  Pertinent Gynecological History: Patient has history of abnormal Pap smear, CIN-2 Patient has a history of classical cesarean section  Patient's last menstrual period was 11/18/2023 (exact date).    Past Medical History:  Diagnosis Date   Anxiety    Back pain    Chlamydia    Depression med made her navel itching and  made her sleepy so she quit taking them   Diabetes mellitus without complication (HCC)    Edema, lower extremity    Elevated cholesterol    Fibrocystic breast changes of both breasts    GERD (gastroesophageal reflux disease)    History of cesarean section, classical 12/18/2012   2014    Human papilloma virus    Hypertension    Insomnia    Joint pain    Moderate dysplasia of cervix    Numbness    right arm to hand   Ovarian cyst    Prediabetes    Tubo-ovarian abscess 12/02/2023   Vitamin D  deficiency     Past Surgical History:  Procedure Laterality Date   ABDOMINAL HYSTERECTOMY N/A 12/08/2023   Procedure: HYSTERECTOMY, ABDOMINAL;  Surgeon: Marilynn Nest, DO;  Location: [facility] OR;  Service: Gynecology;  Laterality: N/A;  W/ EXPLORATORY LAPAROTOMY   BREAST BIOPSY Left 2012   benign fibroadeoma   BREAST SURGERY     CESAREAN SECTION N/A 12/13/2012   Procedure: PRIMARY CESAREAN SECTION;  Surgeon: Aida DELENA Na, MD;  Location: [facility];  Service: Obstetrics;  Laterality: N/A;   LEEP      Family History  Problem Relation  Age of Onset   Diabetes Mother    Heart disease Mother    Hypertension Mother    Depression Mother    Stroke Mother    Arthritis Mother    Anxiety disorder Mother    Stroke Father    High blood pressure Father    High Cholesterol Father    Depression Father    Anxiety disorder Father    Obesity Father    Bipolar disorder Father    Asthma Sister    Kidney disease Brother        genetic condition   Hypertension Maternal Grandmother    Dementia Maternal Grandmother    Heart disease Maternal Grandmother     Diabetes Paternal Grandmother    Diabetes Maternal Aunt    Hypertension Maternal Aunt    Asthma Maternal Aunt    Epilepsy Maternal Aunt     Social History:  reports that she quit smoking about 4 years ago. Her smoking use included cigarettes. She started smoking about 9 years ago. She has a 1.3 pack-year smoking history. She has never used smokeless tobacco. She reports that she does not currently use alcohol. She reports that she does not use drugs.  Allergies:  Allergies  Allergen Reactions   Macrobid  [Nitrofurantoin  Macrocrystal] Itching    Caused the patient to feel hot.    Medications Prior to Admission  Medication Sig Dispense Refill Last Dose/Taking   amLODipine (NORVASC) 5 MG tablet Take 1 tablet (5 mg total) by mouth daily. 30 tablet 0    apixaban (ELIQUIS) 5 MG TABS tablet Take 2 tablets by mouth twice a day for 3 doses then start 1 tablet twice a day thereafter 66 tablet 3    atorvastatin (LIPITOR) 80 MG tablet Take 1 tablet (80 mg total) by mouth daily. 30 tablet 0    chlorthalidone  (HYGROTON ) 25 MG tablet Take 1 tablet (25 mg total) by mouth daily. 60 tablet 0    glycopyrrolate  (ROBINUL ) 1 MG tablet Take 1 tablet (1 mg total) by mouth 3 (three) times daily. 90 tablet 0    losartan (COZAAR) 50 MG tablet Take 1 tablet (50 mg total) by mouth daily. 60 tablet 0    metFORMIN (GLUCOPHAGE) 500 MG tablet Take 1 tablet (500 mg total) by mouth 2 (two) times daily with a meal. 60 tablet 3    metoCLOPramide  (REGLAN ) 10 MG tablet Take 1 tablet (10 mg total) by mouth every 8 (eight) hours for 14 days. 42 tablet 0    ondansetron  (ZOFRAN ) 4 MG tablet Take 1 tablet (4 mg total) by mouth every 6 (six) hours as needed for nausea or vomiting. 20 tablet 0    promethazine  (PHENERGAN ) 25 MG tablet Take 1 tablet (25 mg total) by mouth every 6 (six) hours as needed for refractory nausea / vomiting. 30 tablet 0    scopolamine  (TRANSDERM-SCOP) 1 MG/3DAYS Place 1 patch (1 mg total) onto the skin every 3  (three) days. 10 patch 0    sodium chloride  flush (NS) 0.9 % SOLN Place 10 mLs into feeding tube daily AND flush drain once daily with 5 mL saline from prefilled syringe. 600 mL 0     Review of Systems  Constitutional:  Negative for fever.  Respiratory: Negative.    Cardiovascular: Negative.   Gastrointestinal:  Positive for abdominal distention, nausea and vomiting.  Genitourinary:  Positive for pelvic pain. Negative for menstrual problem.    Blood pressure 138/89, temperature 98.8 F (37.1 C), temperature source Oral, resp.  rate 17, last menstrual period 11/18/2023. Physical Exam Vitals and nursing note reviewed. Exam conducted with a chaperone present.  Constitutional:      Appearance: She is ill-appearing.  HENT:     Head: Normocephalic and atraumatic.  Cardiovascular:     Rate and Rhythm: Normal rate and regular rhythm.     Heart sounds: Normal heart sounds.  Pulmonary:     Effort: Pulmonary effort is normal.     Breath sounds: Normal breath sounds.  Abdominal:     General: Abdomen is protuberant. A surgical scar is present. Bowel sounds are decreased. There is distension.     Palpations: Abdomen is soft.  Skin:    General: Skin is warm and dry.  Neurological:     General: No focal deficit present.     Mental Status: She is alert.  Psychiatric:        Mood and Affect: Mood normal.        Behavior: Behavior normal.     Results for orders placed or performed during the hospital encounter of 12/26/23 (from the past 24 hours)  CBC with Differential/Platelet     Status: Abnormal   Collection Time: 12/26/23  2:47 PM  Result Value Ref Range   WBC 7.2 4.0 - 10.5 K/uL   RBC 3.76 (L) 3.87 - 5.11 MIL/uL   Hemoglobin 11.4 (L) 12.0 - 15.0 g/dL   HCT 65.1 (L) 63.9 - 53.9 %   MCV 92.6 80.0 - 100.0 fL   MCH 30.3 26.0 - 34.0 pg   MCHC 32.8 30.0 - 36.0 g/dL   RDW 85.8 88.4 - 84.4 %   Platelets 448 (H) 150 - 400 K/uL   nRBC 0.0 0.0 - 0.2 %   Neutrophils Relative % 62 %    Neutro Abs 4.5 1.7 - 7.7 K/uL   Lymphocytes Relative 27 %   Lymphs Abs 1.9 0.7 - 4.0 K/uL   Monocytes Relative 9 %   Monocytes Absolute 0.7 0.1 - 1.0 K/uL   Eosinophils Relative 1 %   Eosinophils Absolute 0.1 0.0 - 0.5 K/uL   Basophils Relative 1 %   Basophils Absolute 0.1 0.0 - 0.1 K/uL   Immature Granulocytes 0 %   Abs Immature Granulocytes 0.02 0.00 - 0.07 K/uL  Basic metabolic panel     Status: Abnormal   Collection Time: 12/26/23  2:47 PM  Result Value Ref Range   Sodium 136 135 - 145 mmol/L   Potassium 3.0 (L) 3.5 - 5.1 mmol/L   Chloride 96 (L) 98 - 111 mmol/L   CO2 20 (L) 22 - 32 mmol/L   Glucose, Bld 164 (H) 70 - 99 mg/dL   BUN 10 6 - 20 mg/dL   Creatinine, Ser 8.87 (H) 0.44 - 1.00 mg/dL   Calcium  9.3 8.9 - 10.3 mg/dL   GFR, Estimated >39 >39 mL/min   Anion gap 20 (H) 5 - 15  Glucose, capillary     Status: Abnormal   Collection Time: 12/26/23  2:51 PM  Result Value Ref Range   Glucose-Capillary 147 (H) 70 - 99 mg/dL    No results found.  Assessment/Plan: Intractable nausea and vomiting with mild hematemesis Patient has had intractable nausea and vomiting and persistent spitting and since the beginning of her prior admission on 12/01/2023.  She has been treated with Robinul , Phenergan , scopolamine  patch, and Zofran .  The patient was recently discharged on 12/23/2023 and reports since discharge she has had intractable nausea and vomiting.   Admit Start  IV fluids GI consult in the morning - called Continue scopolamine  patch, add scheduled Zofran , scheduled Reglan , as needed Compazine . IV Robinul  IV Protonix  Trend Hgb N.p.o. Daily weights Strict I's and O's Add LFTs and lipase to labs  Acute kidney injury Avoid nephrotoxic agents IV fluid hydration Trend  Hypokalemia Replete with IV runs of K x 4 Check magnesium  level Trend  Status post exploratory laparotomy Patient status post exploratory laparotomy on 12/08/2023 with an essentially frozen pelvis/abdomen  and an inability to drain all of her abscesses.  Recent pulmonary embolism Continue Eliquis If her intractable nausea and vomiting does not allow this to stay down consider heparin drip or weight-based Lovenox.  Open wound of anterior abdominal wall Secondary to exploratory laparotomy Wound VAC in place 3 times weekly changes Negative pressure wound therapy  T2DM Patient is n.p.o. CBGs every 4 hours Sliding scale insulin  Holding her metformin  Primary hypertension At home patient is on amlodipine, chlorthalidone , Cozaar.  Holding these as she is n.p.o. IV metoprolol as needed for BP control.  Hyperlipidemia Holding her atorvastatin  Lynwood Solomons 12/26/2023, 4:09 PM

## 2023-12-26 NOTE — Telephone Encounter (Signed)
 RN returned pt call after her spouse called clinic to report her not feeling well and requesting patient be seen.  Per admin, no clinic availability today but pt is schedule follow up appointment for 12/29/23.  Pt states she is in extreme pain and has had continuous spitting and vomiting.  She reports she has tried robinul  prescribed for spitting.  I discuss pt complaints with Dr. Zina and Eveline, advised pt to go to [facility]U for evaluation per providers.  Pt states she plans to have family member take her to [facility] at this time.    [place], RN

## 2023-12-26 NOTE — MAU Note (Signed)
 Misty Walsh is a 33 y.o. at Unknown here in MAU reporting: first surgery 10/20.  Has had second procedure and a DVT.  Here today because she can't keep anything down.  Says she is to be readmitted.  Has thrown up so much her wound vac came off again.  Can't take her meds or keep them down.  So much pain in her legs her stomach and her butt. Everything is a 10, feels so weak and shaky.  Actively retching in triage.  phl Onset of complaint: ongoing problems Pain score: everything is a 10   There were no vitals filed for this visit.    Lab orders placed from triage:     Unable to further assess, in triage.  Calling provider

## 2023-12-27 DIAGNOSIS — R188 Other ascites: Secondary | ICD-10-CM

## 2023-12-27 DIAGNOSIS — I2699 Other pulmonary embolism without acute cor pulmonale: Secondary | ICD-10-CM | POA: Diagnosis present

## 2023-12-27 DIAGNOSIS — S31109A Unspecified open wound of abdominal wall, unspecified quadrant without penetration into peritoneal cavity, initial encounter: Secondary | ICD-10-CM | POA: Diagnosis present

## 2023-12-27 DIAGNOSIS — R112 Nausea with vomiting, unspecified: Principal | ICD-10-CM

## 2023-12-27 DIAGNOSIS — E876 Hypokalemia: Secondary | ICD-10-CM

## 2023-12-27 DIAGNOSIS — K651 Peritoneal abscess: Secondary | ICD-10-CM

## 2023-12-27 DIAGNOSIS — D649 Anemia, unspecified: Secondary | ICD-10-CM

## 2023-12-27 LAB — CBC WITH DIFFERENTIAL/PLATELET
Abs Immature Granulocytes: 0.02 K/uL (ref 0.00–0.07)
Basophils Absolute: 0 K/uL (ref 0.0–0.1)
Basophils Relative: 1 %
Eosinophils Absolute: 0.2 K/uL (ref 0.0–0.5)
Eosinophils Relative: 3 %
HCT: 32.2 % — ABNORMAL LOW (ref 36.0–46.0)
Hemoglobin: 10.4 g/dL — ABNORMAL LOW (ref 12.0–15.0)
Immature Granulocytes: 0 %
Lymphocytes Relative: 30 %
Lymphs Abs: 2 K/uL (ref 0.7–4.0)
MCH: 30.9 pg (ref 26.0–34.0)
MCHC: 32.3 g/dL (ref 30.0–36.0)
MCV: 95.5 fL (ref 80.0–100.0)
Monocytes Absolute: 0.8 K/uL (ref 0.1–1.0)
Monocytes Relative: 12 %
Neutro Abs: 3.6 K/uL (ref 1.7–7.7)
Neutrophils Relative %: 54 %
Platelets: 352 K/uL (ref 150–400)
RBC: 3.37 MIL/uL — ABNORMAL LOW (ref 3.87–5.11)
RDW: 14.2 % (ref 11.5–15.5)
WBC: 6.7 K/uL (ref 4.0–10.5)
nRBC: 0 % (ref 0.0–0.2)

## 2023-12-27 LAB — GLUCOSE, CAPILLARY
Glucose-Capillary: 105 mg/dL — ABNORMAL HIGH (ref 70–99)
Glucose-Capillary: 110 mg/dL — ABNORMAL HIGH (ref 70–99)
Glucose-Capillary: 112 mg/dL — ABNORMAL HIGH (ref 70–99)
Glucose-Capillary: 115 mg/dL — ABNORMAL HIGH (ref 70–99)
Glucose-Capillary: 122 mg/dL — ABNORMAL HIGH (ref 70–99)
Glucose-Capillary: 99 mg/dL (ref 70–99)

## 2023-12-27 LAB — MAGNESIUM: Magnesium: 2 mg/dL (ref 1.7–2.4)

## 2023-12-27 LAB — COMPREHENSIVE METABOLIC PANEL WITH GFR
ALT: 21 U/L (ref 0–44)
AST: 27 U/L (ref 15–41)
Albumin: 3.2 g/dL — ABNORMAL LOW (ref 3.5–5.0)
Alkaline Phosphatase: 54 U/L (ref 38–126)
Anion gap: 11 (ref 5–15)
BUN: 12 mg/dL (ref 6–20)
CO2: 26 mmol/L (ref 22–32)
Calcium: 9.4 mg/dL (ref 8.9–10.3)
Chloride: 98 mmol/L (ref 98–111)
Creatinine, Ser: 1.12 mg/dL — ABNORMAL HIGH (ref 0.44–1.00)
GFR, Estimated: >60 mL/min (ref >60)
Glucose, Bld: 112 mg/dL — ABNORMAL HIGH (ref 70–99)
Potassium: 3.4 mmol/L — ABNORMAL LOW (ref 3.5–5.1)
Sodium: 135 mmol/L (ref 135–145)
Total Bilirubin: 1 mg/dL (ref 0.0–1.2)
Total Protein: 7.3 g/dL (ref 6.5–8.1)

## 2023-12-27 MED ORDER — METOPROLOL TARTRATE 5 MG/5ML IV SOLN
5.0000 mg | INTRAVENOUS | Status: DC | PRN
  Filled 2023-12-27: qty 5

## 2023-12-27 MED ORDER — DEXTROSE IN LACTATED RINGERS 5 % IV SOLN: INTRAVENOUS | Status: AC

## 2023-12-27 MED ORDER — ENOXAPARIN SODIUM 120 MG/0.8ML IJ SOSY
120.0000 mg | PREFILLED_SYRINGE | Freq: Two times a day (BID) | INTRAMUSCULAR | Status: DC
  Administered 2023-12-27 – 2024-01-04 (×16): 120 mg via SUBCUTANEOUS
  Filled 2023-12-27 (×17): qty 0.8

## 2023-12-27 MED ORDER — LACTATED RINGERS IV SOLN: INTRAVENOUS | Status: AC

## 2023-12-27 MED ORDER — HYDRALAZINE HCL 20 MG/ML IJ SOLN
10.0000 mg | Freq: Four times a day (QID) | INTRAMUSCULAR | Status: DC
  Administered 2023-12-27 – 2024-01-04 (×31): 10 mg via INTRAVENOUS
  Filled 2023-12-27 (×31): qty 1

## 2023-12-27 MED ORDER — PANTOPRAZOLE SODIUM 40 MG IV SOLR
40.0000 mg | Freq: Two times a day (BID) | INTRAVENOUS | Status: DC
  Administered 2023-12-27 – 2024-01-04 (×18): 40 mg via INTRAVENOUS
  Filled 2023-12-27 (×18): qty 10

## 2023-12-27 NOTE — Assessment & Plan Note (Addendum)
 Status post with IV runs of K x 4 Normal magnesium  level Trend

## 2023-12-27 NOTE — Progress Notes (Signed)
 Patient ID: Misty Walsh, female   DOB: Jan 02, 1991, 33 y.o.   MRN: [id number]   Assessment/Plan: Intractable nausea and vomiting Patient has had intractable nausea and vomiting and persistent spitting and since the beginning of her prior admission on 12/01/2023.  She has been treated with Robinul , Phenergan , scopolamine  patch, and Zofran .  The patient was recently discharged on 12/23/2023 and reports since discharge she has had intractable nausea and vomiting.   Weight is down 10 kg since initial admission. Admit Start IV fluids GI consult today- called Continue scopolamine  patch, add scheduled Zofran , scheduled Reglan , as needed Compazine . IV Robinul  IV Protonix  Trend Hgb N.p.o. Daily weights Strict I's and O's Add LFTs and lipase to labs  Pulmonary embolism (HCC) Continue Eliquis If her intractable nausea and vomiting does not allow this to stay down consider heparin drip or weight-based Lovenox.  S/P exploratory laparotomy Patient status post exploratory laparotomy on 12/08/2023 with an essentially frozen pelvis/abdomen and an inability to drain all of her abscesses.   Hypokalemia Status post with IV runs of K x 4 Normal magnesium  level Trend  AKI (acute kidney injury) Avoid nephrotoxic agents IV fluid hydration Trend  Type 2 diabetes mellitus without complication, without long-term current use of insulin  (HCC) Replete with IV runs of K x 4 Normal magnesium  level Trend  Primary hypertension At home patient is on amlodipine, chlorthalidone , Cozaar.  Holding these as she is n.p.o. IV metoprolol as needed for BP control.  Hyperlipemia Holding her atorvastatin   Tubo-ovarian abscess Status post ex lap with cleanout, IR drainage, IV antibiotics. Normal white count at present Drain is in place and abscess appears smaller  Open wound anterior abdominal wall Secondary to exploratory laparotomy Wound VAC in place 3 times weekly changes Negative pressure wound  therapy    Subjective: Interval History: Reports her nausea is improved today.  She did have an episode of what she thinks is hypoglycemia for her with shaking and not feeling well and a headache.  Her CBG around that time was 105  Objective: Vital signs in last 24 hours: Temp:  [98 F (36.7 C)-98.8 F (37.1 C)] 98.8 F (37.1 C) (11/08 0408) Pulse Rate:  [92-113] 98 (11/08 0408) Resp:  [16-20] 16 (11/08 0408) BP: (126-149)/(62-95) 142/74 (11/08 0408) SpO2:  [95 %-98 %] 96 % (11/08 0408) Weight:  [126.1 kg] 126.1 kg (11/08 0408)  Intake/Output from previous day: 11/07 0701 - 11/08 0700 In: 2093.1 [I.V.:1692] Out: 320 [Urine:300; Drains:20] Intake/Output this shift: No intake/output data recorded.  General appearance: alert, cooperative, and appears stated age Head: Normocephalic, without obvious abnormality, atraumatic Lungs: normal effort Heart: regular rate and rhythm Skin: Skin color, texture, turgor normal. No rashes or lesions  Results for orders placed or performed during the hospital encounter of 12/26/23 (from the past 24 hours)  CBC with Differential/Platelet     Status: Abnormal   Collection Time: 12/26/23  2:47 PM  Result Value Ref Range   WBC 7.2 4.0 - 10.5 K/uL   RBC 3.76 (L) 3.87 - 5.11 MIL/uL   Hemoglobin 11.4 (L) 12.0 - 15.0 g/dL   HCT 65.1 (L) 63.9 - 53.9 %   MCV 92.6 80.0 - 100.0 fL   MCH 30.3 26.0 - 34.0 pg   MCHC 32.8 30.0 - 36.0 g/dL   RDW 85.8 88.4 - 84.4 %   Platelets 448 (H) 150 - 400 K/uL   nRBC 0.0 0.0 - 0.2 %   Neutrophils Relative % 62 %   Neutro Abs  4.5 1.7 - 7.7 K/uL   Lymphocytes Relative 27 %   Lymphs Abs 1.9 0.7 - 4.0 K/uL   Monocytes Relative 9 %   Monocytes Absolute 0.7 0.1 - 1.0 K/uL   Eosinophils Relative 1 %   Eosinophils Absolute 0.1 0.0 - 0.5 K/uL   Basophils Relative 1 %   Basophils Absolute 0.1 0.0 - 0.1 K/uL   Immature Granulocytes 0 %   Abs Immature Granulocytes 0.02 0.00 - 0.07 K/uL  Basic metabolic panel      Status: Abnormal   Collection Time: 12/26/23  2:47 PM  Result Value Ref Range   Sodium 136 135 - 145 mmol/L   Potassium 3.0 (L) 3.5 - 5.1 mmol/L   Chloride 96 (L) 98 - 111 mmol/L   CO2 20 (L) 22 - 32 mmol/L   Glucose, Bld 164 (H) 70 - 99 mg/dL   BUN 10 6 - 20 mg/dL   Creatinine, Ser 8.87 (H) 0.44 - 1.00 mg/dL   Calcium  9.3 8.9 - 10.3 mg/dL   GFR, Estimated >39 >39 mL/min   Anion gap 20 (H) 5 - 15  Glucose, capillary     Status: Abnormal   Collection Time: 12/26/23  2:51 PM  Result Value Ref Range   Glucose-Capillary 147 (H) 70 - 99 mg/dL  Lipase, blood     Status: None   Collection Time: 12/26/23  6:42 PM  Result Value Ref Range   Lipase 39 11 - 51 U/L  TSH     Status: None   Collection Time: 12/26/23  6:42 PM  Result Value Ref Range   TSH 3.170 0.350 - 4.500 uIU/mL  Magnesium      Status: None   Collection Time: 12/26/23  6:42 PM  Result Value Ref Range   Magnesium  2.2 1.7 - 2.4 mg/dL  Hepatic function panel     Status: Abnormal   Collection Time: 12/26/23  6:42 PM  Result Value Ref Range   Total Protein 7.9 6.5 - 8.1 g/dL   Albumin 3.5 3.5 - 5.0 g/dL   AST 36 15 - 41 U/L   ALT 21 0 - 44 U/L   Alkaline Phosphatase 58 38 - 126 U/L   Total Bilirubin 1.0 0.0 - 1.2 mg/dL   Bilirubin, Direct 0.3 (H) 0.0 - 0.2 mg/dL   Indirect Bilirubin 0.7 0.3 - 0.9 mg/dL  Glucose, capillary     Status: Abnormal   Collection Time: 12/26/23  8:03 PM  Result Value Ref Range   Glucose-Capillary 116 (H) 70 - 99 mg/dL  Glucose, capillary     Status: Abnormal   Collection Time: 12/27/23 12:05 AM  Result Value Ref Range   Glucose-Capillary 112 (H) 70 - 99 mg/dL  Glucose, capillary     Status: Abnormal   Collection Time: 12/27/23  4:10 AM  Result Value Ref Range   Glucose-Capillary 105 (H) 70 - 99 mg/dL    Studies/Results:   Scheduled Meds:  apixaban  5 mg Oral BID   glycopyrrolate   0.1 mg Intravenous TID   insulin  aspart  0-20 Units Subcutaneous Q4H   metoCLOPramide  (REGLAN ) injection   10 mg Intravenous Q6H   ondansetron  (ZOFRAN ) IV  4 mg Intravenous Q6H   pantoprazole  (PROTONIX ) IV  40 mg Intravenous Q24H   scopolamine   1 patch Transdermal Q72H   Continuous Infusions:  lactated ringers  150 mL/hr at 12/27/23 0610   PRN Meds:guaiFENesin , HYDROmorphone  (DILAUDID ) injection, menthol , metoprolol tartrate, polyethylene glycol, prochlorperazine     LOS: 1 day  Glenys GORMAN Birk, MD 12/27/2023 7:34 AM

## 2023-12-27 NOTE — Assessment & Plan Note (Signed)
 Secondary to exploratory laparotomy Wound VAC in place 3 times weekly changes Negative pressure wound therapy

## 2023-12-27 NOTE — Assessment & Plan Note (Addendum)
 Replete with IV runs of K x 4 Normal magnesium  level Trend

## 2023-12-27 NOTE — Assessment & Plan Note (Signed)
 Holding her atorvastatin

## 2023-12-27 NOTE — Assessment & Plan Note (Signed)
 At home patient is on amlodipine, chlorthalidone , Cozaar.  Holding these as she is n.p.o. IV metoprolol as needed for BP control.

## 2023-12-27 NOTE — Assessment & Plan Note (Signed)
 Status post ex lap with cleanout, IR drainage, IV antibiotics. Normal white count at present Drain is in place and abscess appears smaller

## 2023-12-27 NOTE — Assessment & Plan Note (Signed)
 Patient status post exploratory laparotomy on 12/08/2023 with an essentially frozen pelvis/abdomen and an inability to drain all of her abscesses.

## 2023-12-27 NOTE — Assessment & Plan Note (Addendum)
 Patient has had intractable nausea and vomiting and persistent spitting and since the beginning of her prior admission on 12/01/2023.  She has been treated with Robinul , Phenergan , scopolamine  patch, and Zofran .  The patient was recently discharged on 12/23/2023 and reports since discharge she has had intractable nausea and vomiting.   Weight is down 10 kg since initial admission. Admit Start IV fluids GI consult today- called Continue scopolamine  patch, add scheduled Zofran , scheduled Reglan , as needed Compazine . IV Robinul  IV Protonix  Trend Hgb N.p.o. Daily weights Strict I's and O's Add LFTs and lipase to labs

## 2023-12-27 NOTE — Assessment & Plan Note (Addendum)
 Avoid nephrotoxic agents IV fluid hydration Trend

## 2023-12-27 NOTE — Plan of Care (Signed)

## 2023-12-27 NOTE — Assessment & Plan Note (Signed)
 Continue Eliquis If her intractable nausea and vomiting does not allow this to stay down consider heparin drip or weight-based Lovenox.

## 2023-12-27 NOTE — Consult Note (Signed)
 Consultation  Referring Provider:  VALJEAN Eliott Primary Care Physician:  Buck Search, PA-C Primary Gastroenterologist:  unassigned  Reason for Consultation: Persistent nausea and vomiting  HPI: Misty Walsh is a 33 y.o. female with history of GERD, but no other known GI issues.  Patient has had a complicated recent course.  Initially admitted mid October with recurrent large pelvic abscess with sepsis.  IR was unable to percutaneously drain this and she ultimately underwent exploratory lap with GYN and general surgery on 12/08/2023 with removal of the abscess wall, lysis of adhesions and drains were placed.  It was noted at that time that her GYN anatomy was difficult to differentiate from this process.  She had postoperative intractable nausea and vomiting.  She did require course of IV antibiotics and was discharged home on oral antibiotics.  During that hospitalization her course was complicated by bilateral DVTs and a PE and she was started on anticoagulation.  Repeat imaging during that same admission showed a persistent large abscess in her posterior pelvis, this was able to be drained by IR on 12/17/2023 with transgluteal drain.  Her staples were removed 11 days postop and her wound separated and she required wound VAC placement.  She was able to be discharged home on 12/23/2023 but was not taking much p.o. at that time.  She says after she went home the nausea and vomiting worsened and she was unable to keep down her meds including the antiemetics.  Per the notes she also had an episode of emesis so strong that she disconnected her wound VAC. She was readmitted yesterday, had repeat imaging with repeat CT of the abdomen and pelvis-with contrast shows the dominant fluid collection in the pelvis had decreased somewhat in size but is still present, there was increasing right abdominal wall/rectus sheath gas and fluid collection and midline low anterior abdominal subcutaneous fluid collection  that has increased in size, there is similar mild right hydronephrosis with abrupt tapering of the right ureter at the pelvic collection and resolution of the previous left-sided hydronephrosis, there is some decrease in sigmoid colon wall thickening and adjacent stranding where it abuts the fluid collection.  Labs on admit WBC 7.2/hemoglobin 11.4/hematocrit 34.8 Potassium 3.0/BUN 10/creatinine 1.12 Lipase normal LFTs normal  She says she still feels nauseated today but she has not vomited, she does feel that the medications that have been started are helping but seem to wear off.  She would like to try some liquids including coffee.  Needs to have a lot of abdominal pain and is concerned that her wound VAC has not been functioning correctly.  She has not been having any diarrhea, no bowel movement today. No hematemesis.  Does complain of pain/soreness across the upper abdomen.  He has had some difficulty with GERD as an outpatient, has never seen GI and had not been on any regular medications.   Past Medical History:  Diagnosis Date   Anxiety    Back pain    Chlamydia    Depression med made her navel itching and  made her sleepy so she quit taking them   Diabetes mellitus without complication (HCC)    Edema, lower extremity    Elevated cholesterol    Fibrocystic breast changes of both breasts    GERD (gastroesophageal reflux disease)    History of cesarean section, classical 12/18/2012   2014    Human papilloma virus    Hypertension    Insomnia    Joint pain  Moderate dysplasia of cervix    Numbness    right arm to hand   Ovarian cyst    Prediabetes    Tubo-ovarian abscess 12/02/2023   Vitamin D  deficiency     Past Surgical History:  Procedure Laterality Date   ABDOMINAL HYSTERECTOMY N/A 12/08/2023   Procedure: HYSTERECTOMY, ABDOMINAL;  Surgeon: Marilynn Nest, DO;  Location: [facility];  Service: Gynecology;  Laterality: N/A;  W/ EXPLORATORY LAPAROTOMY   BREAST BIOPSY Left  2012   benign fibroadeoma   BREAST SURGERY     CESAREAN SECTION N/A 12/13/2012   Procedure: PRIMARY CESAREAN SECTION;  Surgeon: Aida DELENA Na, MD;  Location: [facility];  Service: Obstetrics;  Laterality: N/A;   LEEP      Prior to Admission medications   Medication Sig Start Date End Date Taking? Authorizing Provider  amLODipine (NORVASC) 5 MG tablet Take 1 tablet (5 mg total) by mouth daily. 12/14/23   Zina Jerilynn DELENA, MD  apixaban (ELIQUIS) 5 MG TABS tablet Take 2 tablets by mouth twice a day for 3 doses then start 1 tablet twice a day thereafter 12/23/23   Arnold, James G, MD  atorvastatin (LIPITOR) 80 MG tablet Take 1 tablet (80 mg total) by mouth daily. 12/14/23   Ozan, Jennifer, DO  chlorthalidone  (HYGROTON ) 25 MG tablet Take 1 tablet (25 mg total) by mouth daily. 12/14/23   Zina Jerilynn DELENA, MD  glycopyrrolate  (ROBINUL ) 1 MG tablet Take 1 tablet (1 mg total) by mouth 3 (three) times daily. 12/14/23   Zina Jerilynn DELENA, MD  losartan (COZAAR) 50 MG tablet Take 1 tablet (50 mg total) by mouth daily. 12/14/23   Zina Jerilynn DELENA, MD  metFORMIN (GLUCOPHAGE) 500 MG tablet Take 1 tablet (500 mg total) by mouth 2 (two) times daily with a meal. 12/23/23   Jayne Vonn DEL, MD  metoCLOPramide  (REGLAN ) 10 MG tablet Take 1 tablet (10 mg total) by mouth every 8 (eight) hours for 14 days. 12/23/23 01/06/24  Jayne Vonn DEL, MD  ondansetron  (ZOFRAN ) 4 MG tablet Take 1 tablet (4 mg total) by mouth every 6 (six) hours as needed for nausea or vomiting. 12/14/23   Zina Jerilynn DELENA, MD  promethazine  (PHENERGAN ) 25 MG tablet Take 1 tablet (25 mg total) by mouth every 6 (six) hours as needed for refractory nausea / vomiting. 12/13/23   Ozan, Jennifer, DO  scopolamine  (TRANSDERM-SCOP) 1 MG/3DAYS Place 1 patch (1 mg total) onto the skin every 3 (three) days. 12/15/23   Zina Jerilynn DELENA, MD  sodium chloride  flush (NS) 0.9 % SOLN Place 10 mLs into feeding tube daily AND flush drain once daily with 5 mL saline from  prefilled syringe. 12/23/23 02/21/24  Jayne Vonn DEL, MD    Current Facility-Administered Medications  Medication Dose Route Frequency Provider Last Rate Last Admin   apixaban (ELIQUIS) tablet 5 mg  5 mg Oral BID Pratt, Tanya S, MD   5 mg at 12/27/23 1027   dextrose  5 % in lactated ringers  infusion   Intravenous Continuous Fredirick Glenys RAMAN, MD 10 mL/hr at 12/27/23 0813 New Bag at 12/27/23 0813   glycopyrrolate  (ROBINUL ) injection 0.1 mg  0.1 mg Intravenous TID Pratt, Tanya S, MD   0.1 mg at 12/27/23 0943   guaiFENesin  (ROBITUSSIN) 100 MG/5ML liquid 15 mL  15 mL Oral Q4H PRN Pratt, Tanya S, MD       HYDROmorphone  (DILAUDID ) injection 0.2-0.6 mg  0.2-0.6 mg Intravenous Q2H PRN Pratt, Tanya S, MD   0.6 mg at  12/27/23 0432   insulin  aspart (novoLOG) injection 0-20 Units  0-20 Units Subcutaneous Q4H Fredirick Glenys RAMAN, MD       lactated ringers  infusion   Intravenous Continuous Fredirick Glenys RAMAN, MD   Stopped at 12/27/23 0809   menthol  (CEPACOL) lozenge 3 mg  1 lozenge Oral Q2H PRN Fredirick Glenys RAMAN, MD       metoCLOPramide  (REGLAN ) injection 10 mg  10 mg Intravenous Q6H Pratt, Tanya S, MD   10 mg at 12/27/23 0813   metoprolol tartrate (LOPRESSOR) injection 5 mg  5 mg Intravenous Q5 min PRN Fredirick Glenys RAMAN, MD       ondansetron  (ZOFRAN ) injection 4 mg  4 mg Intravenous Q6H Pratt, Tanya S, MD   4 mg at 12/27/23 9391   pantoprazole  (PROTONIX ) injection 40 mg  40 mg Intravenous Q24H Pratt, Tanya S, MD   40 mg at 12/26/23 1846   polyethylene glycol (MIRALAX  / GLYCOLAX ) packet 17 g  17 g Oral Daily PRN Fredirick Glenys RAMAN, MD       prochlorperazine  (COMPAZINE ) injection 10 mg  10 mg Intravenous Q6H PRN Pratt, Tanya S, MD   10 mg at 12/27/23 0950   scopolamine  (TRANSDERM-SCOP) 1 MG/3DAYS 1 mg  1 patch Transdermal Q72H Fredirick Glenys RAMAN, MD   1 mg at 12/26/23 1851    Allergies as of 12/26/2023 - Review Complete 12/26/2023  Allergen Reaction Noted   Macrobid  [nitrofurantoin  macrocrystal] Itching 12/18/2012    Family History   Problem Relation Age of Onset   Diabetes Mother    Heart disease Mother    Hypertension Mother    Depression Mother    Stroke Mother    Arthritis Mother    Anxiety disorder Mother    Stroke Father    High blood pressure Father    High Cholesterol Father    Depression Father    Anxiety disorder Father    Obesity Father    Bipolar disorder Father    Asthma Sister    Kidney disease Brother        genetic condition   Hypertension Maternal Grandmother    Dementia Maternal Grandmother    Heart disease Maternal Grandmother    Diabetes Paternal Grandmother    Diabetes Maternal Aunt    Hypertension Maternal Aunt    Asthma Maternal Aunt    Epilepsy Maternal Aunt     Social History   Socioeconomic History   Marital status: Single    Spouse name: Not on file   Number of children: 1   Years of education: Not on file   Highest education level: 11th grade  Occupational History   Occupation: work at home    Comment: at home  Tobacco Use   Smoking status: Former    Current packs/day: 0.00    Average packs/day: 0.3 packs/day for 5.0 years (1.3 ttl pk-yrs)    Types: Cigarettes    Start date: 12/20/2014    Quit date: 12/20/2019    Years since quitting: 4.0   Smokeless tobacco: Never  Vaping Use   Vaping status: Never Used  Substance and Sexual Activity   Alcohol use: Not Currently    Comment: occ   Drug use: No   Sexual activity: Not Currently    Birth control/protection: Condom  Other Topics Concern   Not on file  Social History Narrative   Lives with her child   Caffeine- green tea, Poweraid, grape juice   Social Drivers of Corporate Investment Banker Strain:  Not on File (06/07/2021)   Received from [employer] [facility] Strain: 0  Food Insecurity: No Food Insecurity (12/26/2023)   Hunger Vital Sign    Worried About Running Out of Food in the Last Year: Never true    Ran Out of Food in the Last Year: Never true  Transportation  Needs: No Transportation Needs (12/26/2023)   PRAPARE - Administrator, Civil Service (Medical): No    Lack of Transportation (Non-Medical): No  Physical Activity: Not on File (06/07/2021)   Received from [facility]   Physical Activity    Physical Activity: 0  Stress: Not on File (06/07/2021)   Received from [facility]   Stress    Stress: 0  Social Connections: Not on File (11/04/2022)   Received from [facility]   Social Connections    Connectedness: 0  Intimate Partner Violence: Not At Risk (12/26/2023)   Humiliation, Afraid, Rape, and Kick questionnaire    Fear of Current or Ex-Partner: No    Emotionally Abused: No    Physically Abused: No    Sexually Abused: No    Review of Systems: Pertinent positive and negative review of systems were noted in the above HPI section.  All other review of systems was otherwise negative.   Physical Exam: Vital signs in last 24 hours: Temp:  [98 F (36.7 C)-98.8 F (37.1 C)] 98.2 F (36.8 C) (11/08 1131) Pulse Rate:  [92-113] 109 (11/08 1131) Resp:  [16-20] 20 (11/08 1131) BP: (126-149)/(62-95) 146/77 (11/08 1131) SpO2:  [95 %-98 %] 96 % (11/08 1131) Weight:  [126.1 kg] 126.1 kg (11/08 0408)   General:   Alert,  Well-developed, well-nourished, pleasant and cooperative in NAD Head:  Normocephalic and atraumatic. Eyes:  Sclera clear, no icterus.   Conjunctiva pink. Ears:  Normal auditory acuity. Nose:  No deformity, discharge,  or lesions. Mouth:  No deformity or lesions.   Neck:  Supple; no masses or thyromegaly. Lungs:  Clear throughout to auscultation.   No wheezes, crackles, or rhonchi. Heart:  Regular rate and rhythm; no murmurs, clicks, rubs,  or gallops. Abdomen:  Soft,nontender, BS active,nonpalp mass or hsm.   Rectal:  Deferred  Msk:  Symmetrical without gross deformities. . Pulses:  Normal pulses noted. Extremities:  Without clubbing or edema. Neurologic:  Alert and  oriented x4;  grossly normal neurologically. Skin:  Intact  without significant lesions or rashes.. Psych:  Alert and cooperative. Normal mood and affect.  Intake/Output from previous day: 11/07 0701 - 11/08 0700 In: 2093.1 [I.V.:1692; IV Piggyback:401] Out: 320 [Urine:300; Drains:20] Intake/Output this shift: No intake/output data recorded.  Lab Results: Recent Labs    12/26/23 1447 12/27/23 0917  WBC 7.2 6.7  HGB 11.4* 10.4*  HCT 34.8* 32.2*  PLT 448* 352   BMET Recent Labs    12/26/23 1447 12/27/23 0917  NA 136 135  K 3.0* 3.4*  CL 96* 98  CO2 20* 26  GLUCOSE 164* 112*  BUN 10 12  CREATININE 1.12* 1.12*  CALCIUM  9.3 9.4   LFT Recent Labs    12/26/23 1842 12/27/23 0917  PROT 7.9 7.3  ALBUMIN 3.5 3.2*  AST 36 27  ALT 21 21  ALKPHOS 58 54  BILITOT 1.0 1.0  BILIDIR 0.3*  --   IBILI 0.7  --    PT/INR No results for input(s): LABPROT, INR in the last 72 hours. Hepatitis Panel No results for input(s): HEPBSAG, HCVAB, HEPAIGM, HEPBIGM in the  last 72 hours.   IMPRESSION:  #71 33 year old female with intractable nausea and vomiting in setting of prolonged recent acute illness with large pelvic abscess requiring surgical drainage and lysis of adhesions, then postoperative percutaneous drainage with transgluteal drain. Unfortunately she had separation of her wound, and required wound VAC placement, and in addition has now developed rectus sheath right gas/fluid collection and an additional midline low anterior abdominal subcutaneous fluid collection.  Most likely her intractable nausea and vomiting is reactive to acute illness, antibiotics, perhaps has developed illness induced gastroparesis, gastropathy. She does not appear to have any upper abdominal/gastric/duodenal involvement with wall thickening or abscesses.  #2 anemia mild #3 mild hypokalemia   PLAN: Full liquid diet, advance as tolerates Agree with around-the-clock Zofran  4 mg every 6 hours and scheduled Reglan  Will add Protonix  40 mg IV twice  daily We can consider endoscopic evaluation if she does not have improvement in symptoms over the next few days although this may not alter our management much. I think she  needs aggressive supportive management, antibiotics and resolution of abscesses for nausea and vomiting to resolve.   Annelle Behrendt EsterwoodPA-C  12/27/2023, 12:24 PM

## 2023-12-28 ENCOUNTER — Other Ambulatory Visit: Payer: Self-pay | Admitting: Radiology

## 2023-12-28 DIAGNOSIS — R112 Nausea with vomiting, unspecified: Secondary | ICD-10-CM

## 2023-12-28 LAB — BASIC METABOLIC PANEL WITH GFR
Anion gap: 13 (ref 5–15)
BUN: 9 mg/dL (ref 6–20)
CO2: 25 mmol/L (ref 22–32)
Calcium: 8.8 mg/dL — ABNORMAL LOW (ref 8.9–10.3)
Chloride: 98 mmol/L (ref 98–111)
Creatinine, Ser: 1.39 mg/dL — ABNORMAL HIGH (ref 0.44–1.00)
GFR, Estimated: 51 mL/min — ABNORMAL LOW (ref >60)
Glucose, Bld: 93 mg/dL (ref 70–99)
Potassium: 3.2 mmol/L — ABNORMAL LOW (ref 3.5–5.1)
Sodium: 136 mmol/L (ref 135–145)

## 2023-12-28 LAB — CBC WITH DIFFERENTIAL/PLATELET
Abs Immature Granulocytes: 0.03 K/uL (ref 0.00–0.07)
Basophils Absolute: 0.1 K/uL (ref 0.0–0.1)
Basophils Relative: 1 %
Eosinophils Absolute: 0.2 K/uL (ref 0.0–0.5)
Eosinophils Relative: 2 %
HCT: 31.2 % — ABNORMAL LOW (ref 36.0–46.0)
Hemoglobin: 10.2 g/dL — ABNORMAL LOW (ref 12.0–15.0)
Immature Granulocytes: 0 %
Lymphocytes Relative: 29 %
Lymphs Abs: 2.2 K/uL (ref 0.7–4.0)
MCH: 31.1 pg (ref 26.0–34.0)
MCHC: 32.7 g/dL (ref 30.0–36.0)
MCV: 95.1 fL (ref 80.0–100.0)
Monocytes Absolute: 0.8 K/uL (ref 0.1–1.0)
Monocytes Relative: 11 %
Neutro Abs: 4.2 K/uL (ref 1.7–7.7)
Neutrophils Relative %: 57 %
Platelets: 345 K/uL (ref 150–400)
RBC: 3.28 MIL/uL — ABNORMAL LOW (ref 3.87–5.11)
RDW: 14.2 % (ref 11.5–15.5)
WBC: 7.4 K/uL (ref 4.0–10.5)
nRBC: 0 % (ref 0.0–0.2)

## 2023-12-28 LAB — GLUCOSE, CAPILLARY
Glucose-Capillary: 107 mg/dL — ABNORMAL HIGH (ref 70–99)
Glucose-Capillary: 111 mg/dL — ABNORMAL HIGH (ref 70–99)
Glucose-Capillary: 114 mg/dL — ABNORMAL HIGH (ref 70–99)
Glucose-Capillary: 147 mg/dL — ABNORMAL HIGH (ref 70–99)
Glucose-Capillary: 94 mg/dL (ref 70–99)
Glucose-Capillary: 95 mg/dL (ref 70–99)
Glucose-Capillary: 99 mg/dL (ref 70–99)

## 2023-12-28 LAB — MAGNESIUM: Magnesium: 1.8 mg/dL (ref 1.7–2.4)

## 2023-12-28 MED ORDER — ATORVASTATIN CALCIUM 80 MG PO TABS: 80.0000 mg | ORAL_TABLET | Freq: Every day | ORAL | Status: DC

## 2023-12-28 MED ORDER — DEXTROSE IN LACTATED RINGERS 5 % IV SOLN: INTRAVENOUS | Status: AC

## 2023-12-28 NOTE — Consult Note (Addendum)
 Initial Consultation Note   Patient: Misty Walsh FMW:[id number] DOB: [birth year] PCP: Buck Search, PA-C DOA: 12/26/2023 DOS: the patient was seen and examined on 12/28/2023 Primary service: Fredirick Glenys GORMAN, MD  Referring physician: Jennifer Ozan Reason for consult: Hypokalemia  Assessment/Plan: Assessment and Plan: 53F h/o HTN, HLD, DM2, PE and recent complicated course following pelvic abscesses and sepsis, status post exploratory laparotomy, multiple intra-abdominal fluid collections, some partially drained with refractory nausea vomiting c/b hypokalemia.  Hypokalemia -F/u BMP daily; replete prn; goal K >4 -F/u Mg daily; replete prn; goal Mg >2  Intractable n/v -Defer to GI  HTN -Continue IV hydralazine 10mg  QID for now -HOLD pta amlodipine, chlothalidone and losartan for now while ongoing intractable n/v; pt normotensive without these meds; resume prn  HLD -HOLD PTA atorvastatin 80mg  daily and resume at d/c  DM2 -SSI TID AC alone for now -F/u A1c; if >10, then consult diabetes coordinator   [facility] will continue to follow the patient.  HPI: Misty Walsh is a 33 y.o. female with past medical history of HTN, HLD, DM2, PE and recent complicated course following pelvic abscesses and sepsis, status post exploratory laparotomy, multiple intra-abdominal fluid collections, some partially drained with refractory nausea vomiting c/b hypokalemia.   Review of Systems: As mentioned in the history of present illness. All other systems reviewed and are negative. Past Medical History:  Diagnosis Date   Anxiety    Back pain    Chlamydia    Depression med made her navel itching and  made her sleepy so she quit taking them   Diabetes mellitus without complication (HCC)    Edema, lower extremity    Elevated cholesterol    Fibrocystic breast changes of both breasts    GERD (gastroesophageal reflux disease)    History of cesarean section, classical 12/18/2012   2014    Human  papilloma virus    Hypertension    Insomnia    Joint pain    Moderate dysplasia of cervix    Numbness    right arm to hand   Ovarian cyst    Prediabetes    Tubo-ovarian abscess 12/02/2023   Vitamin D  deficiency    Past Surgical History:  Procedure Laterality Date   ABDOMINAL HYSTERECTOMY N/A 12/08/2023   Procedure: HYSTERECTOMY, ABDOMINAL;  Surgeon: Marilynn Nest, DO;  Location: [facility] OR;  Service: Gynecology;  Laterality: N/A;  W/ EXPLORATORY LAPAROTOMY   BREAST BIOPSY Left 2012   benign fibroadeoma   BREAST SURGERY     CESAREAN SECTION N/A 12/13/2012   Procedure: PRIMARY CESAREAN SECTION;  Surgeon: Aida DELENA Na, MD;  Location: [facility];  Service: Obstetrics;  Laterality: N/A;   LEEP     Social History:  reports that she quit smoking about 4 years ago. Her smoking use included cigarettes. She started smoking about 9 years ago. She has a 1.3 pack-year smoking history. She has never used smokeless tobacco. She reports that she does not currently use alcohol. She reports that she does not use drugs.  Allergies  Allergen Reactions   Macrobid  [Nitrofurantoin  Macrocrystal] Itching    Caused the patient to feel hot.    Family History  Problem Relation Age of Onset   Diabetes Mother    Heart disease Mother    Hypertension Mother    Depression Mother    Stroke Mother    Arthritis Mother    Anxiety disorder Mother    Stroke Father    High blood pressure Father    High  Cholesterol Father    Depression Father    Anxiety disorder Father    Obesity Father    Bipolar disorder Father    Asthma Sister    Kidney disease Brother        genetic condition   Hypertension Maternal Grandmother    Dementia Maternal Grandmother    Heart disease Maternal Grandmother    Diabetes Paternal Grandmother    Diabetes Maternal Aunt    Hypertension Maternal Aunt    Asthma Maternal Aunt    Epilepsy Maternal Aunt     Prior to Admission medications   Medication Sig Start Date End Date  Taking? Authorizing Provider  amLODipine (NORVASC) 5 MG tablet Take 1 tablet (5 mg total) by mouth daily. 12/14/23   Zina Jerilynn LABOR, MD  apixaban (ELIQUIS) 5 MG TABS tablet Take 2 tablets by mouth twice a day for 3 doses then start 1 tablet twice a day thereafter 12/23/23   Arnold, James G, MD  atorvastatin (LIPITOR) 80 MG tablet Take 1 tablet (80 mg total) by mouth daily. 12/14/23   Ozan, Jennifer, DO  chlorthalidone  (HYGROTON ) 25 MG tablet Take 1 tablet (25 mg total) by mouth daily. 12/14/23   Zina Jerilynn LABOR, MD  glycopyrrolate  (ROBINUL ) 1 MG tablet Take 1 tablet (1 mg total) by mouth 3 (three) times daily. 12/14/23   Zina Jerilynn LABOR, MD  losartan (COZAAR) 50 MG tablet Take 1 tablet (50 mg total) by mouth daily. 12/14/23   Zina Jerilynn LABOR, MD  metFORMIN (GLUCOPHAGE) 500 MG tablet Take 1 tablet (500 mg total) by mouth 2 (two) times daily with a meal. 12/23/23   Jayne Vonn DEL, MD  metoCLOPramide  (REGLAN ) 10 MG tablet Take 1 tablet (10 mg total) by mouth every 8 (eight) hours for 14 days. 12/23/23 01/06/24  Jayne Vonn DEL, MD  ondansetron  (ZOFRAN ) 4 MG tablet Take 1 tablet (4 mg total) by mouth every 6 (six) hours as needed for nausea or vomiting. 12/14/23   Zina Jerilynn LABOR, MD  promethazine  (PHENERGAN ) 25 MG tablet Take 1 tablet (25 mg total) by mouth every 6 (six) hours as needed for refractory nausea / vomiting. 12/13/23   Ozan, Jennifer, DO  scopolamine  (TRANSDERM-SCOP) 1 MG/3DAYS Place 1 patch (1 mg total) onto the skin every 3 (three) days. 12/15/23   Zina Jerilynn LABOR, MD  sodium chloride  flush (NS) 0.9 % SOLN Place 10 mLs into feeding tube daily AND flush drain once daily with 5 mL saline from prefilled syringe. 12/23/23 02/21/24  Jayne Vonn DEL, MD    Physical Exam: Vitals:   12/28/23 0419 12/28/23 0725 12/28/23 0756 12/28/23 1146  BP: 138/63  131/68 123/70  Pulse: (!) 114  (!) 105 100  Resp: 18  20 20   Temp: 98 F (36.7 C)  98.4 F (36.9 C) 98.5 F (36.9 C)  TempSrc:   Oral Oral   SpO2:   98% 95%  Weight:  128.6 kg    Height:  5' 4 (1.626 m)     General: Alert, oriented x3, resting comfortably in no acute distress Respiratory: Lungs clear to auscultation bilaterally with normal respiratory effort; no w/r/r Cardiovascular: Regular rate and rhythm w/o m/r/g Abdomen: Soft, nontender, nondistended. Positive bowel sounds   Data Reviewed:   Lab Results  Component Value Date   WBC 7.4 12/28/2023   HGB 10.2 (L) 12/28/2023   HCT 31.2 (L) 12/28/2023   MCV 95.1 12/28/2023   PLT 345 12/28/2023   Lab Results  Component Value Date  GLUCOSE 93 12/28/2023   CALCIUM  8.8 (L) 12/28/2023   NA 136 12/28/2023   K 3.2 (L) 12/28/2023   CO2 25 12/28/2023   CL 98 12/28/2023   BUN 9 12/28/2023   CREATININE 1.39 (H) 12/28/2023   Lab Results  Component Value Date   ALT 21 12/27/2023   AST 27 12/27/2023   ALKPHOS 54 12/27/2023   BILITOT 1.0 12/27/2023   Lab Results  Component Value Date   INR 1.2 12/17/2023   INR 1.3 (H) 12/16/2023   INR 1.2 12/08/2023   Radiology: No results found.   Family Communication: N/A Primary team communication: N/A   Thank you very much for involving us  in the care of your patient.   ------- I spent 55 minutes reviewing previous notes, at the bedside counseling/discussing the treatment plan, and performing clinical documentation.  Author: Marsha Ada, MD 12/28/2023 12:56 PM  For on call review [email or web address].

## 2023-12-28 NOTE — H&P (View-Only) (Signed)
 Progress Note   Assessment    33 year old with complicated course following pelvic abscesses and sepsis, status post exploratory laparotomy, multiple intra-abdominal fluid collections, some partially drained with refractory nausea vomiting  Principal Problem:   Intractable nausea and vomiting Active Problems:   Hyperlipemia   Primary hypertension   Type 2 diabetes mellitus without complication, without long-term current use of insulin  (HCC)   Tubo-ovarian abscess   AKI (acute kidney injury)   S/P exploratory laparotomy   Pulmonary embolism (HCC)   Hypokalemia   Open wound anterior abdominal wall   Recommendations   1.  Intractable nausea vomiting --refractory to multiple antiemetics IV.  It is still most likely this process is related to her intra-abdominal infection but we will proceed with EGD with MAC tomorrow to exclude proximal GI tract complicating factor -- EGD tomorrow with monitored anesthesia care with Dr. Leigh --Continue scheduled antiemetics --Continue twice daily IV PPI --The nature of the procedure, as well as the risks, benefits, and alternatives were carefully and thoroughly reviewed with the patient. Ample time for discussion and questions allowed. The patient understood, was satisfied, and agreed to proceed.  -- Will hold subcutaneous therapeutic Lovenox dose at 8 AM in the morning for procedure; will need to be restarted thereafter  2.  History of PE --on therapeutic Lovenox, see above  3.  Intra-abdominal fluid collections/abscesses/prior exploratory laparotomy --by CT fluid collections are partially drained.  Several seem to be difficult to access by IR -- Would consider ID consult  Dr. Leigh will assume care of the [facility] service tomorrow.    35 minutes total spent today including patient facing time, coordination of care, reviewing medical history/procedures/pertinent radiology studies, and documentation of the encounter.   Chief  Complaint   Persistent nausea and dry heaves despite IV antiemetics Still has pruritus No family at bedside today  Vital signs in last 24 hours: Temp:  [98 F (36.7 C)-99.7 F (37.6 C)] 98.4 F (36.9 C) (11/09 0756) Pulse Rate:  [89-115] 105 (11/09 0756) Resp:  [18-20] 20 (11/09 0756) BP: (131-201)/(63-136) 131/68 (11/09 0756) SpO2:  [95 %-98 %] 98 % (11/09 0756) Weight:  [128.6 kg] 128.6 kg (11/09 0725) Gen: awake, alert, NAD HEENT: anicteric  CV: tachy, regular Pulm: CTA b/l Abd: soft, diffusely tender, obese,  +BS throughout Ext: no c/c/e Neuro: nonfocal  Intake/Output from previous day: 11/08 0701 - 11/09 0700 In: 0  Out: 300 [Urine:300] Intake/Output this shift: Total I/O In: 1302.2 [I.V.:1302.2] Out: 0   Lab Results: Recent Labs    12/26/23 1447 12/27/23 0917  WBC 7.2 6.7  HGB 11.4* 10.4*  HCT 34.8* 32.2*  PLT 448* 352   BMET Recent Labs    12/26/23 1447 12/27/23 0917  NA 136 135  K 3.0* 3.4*  CL 96* 98  CO2 20* 26  GLUCOSE 164* 112*  BUN 10 12  CREATININE 1.12* 1.12*  CALCIUM  9.3 9.4   LFT Recent Labs    12/26/23 1842 12/27/23 0917  PROT 7.9 7.3  ALBUMIN 3.5 3.2*  AST 36 27  ALT 21 21  ALKPHOS 58 54  BILITOT 1.0 1.0  BILIDIR 0.3*  --   IBILI 0.7  --     Studies/Results: CT ABDOMEN PELVIS W CONTRAST Result Date: 12/26/2023 EXAM: CT ABDOMEN AND PELVIS WITH CONTRAST 12/26/2023 04:53:00 PM TECHNIQUE: CT of the abdomen and pelvis was performed with the administration of 75 mL of iohexol  (OMNIPAQUE ) 350 MG/ML injection. Multiplanar reformatted images are provided for review.  Automated exposure control, iterative reconstruction, and/or weight-based adjustment of the mA/kV was utilized to reduce the radiation dose to as low as reasonably achievable. COMPARISON: CT / 26 / 25 CLINICAL HISTORY: Abdominal pain, post-op. FINDINGS: LOWER CHEST: No acute abnormality. LIVER: Hepatic steatosis. GALLBLADDER AND BILE DUCTS: Gallbladder is unremarkable. No  biliary ductal dilatation. SPLEEN: No acute abnormality. PANCREAS: No acute abnormality. ADRENAL GLANDS: No acute abnormality. KIDNEYS, URETERS AND BLADDER: Resolution of the previous left hydroureteronephrosis. Similar mild right hydroureteronephrosis with abrupt tapering of the right ureter as it crosses the iliac vessels in the region of the pelvic fluid collection. No stones in the kidneys or ureters. No perinephric or periureteral stranding. Urinary bladder is unremarkable. GI AND BOWEL: Stomach demonstrates no acute abnormality. Decreased wall thickening and adjacent stranding about the sigmoid colon where it abuts the fluid collection. There is no bowel obstruction. PERITONEUM AND RETROPERITONEUM: Interval removal of the previous ventral abdominal wall percutaneous drains with placement of a right transgluteal drain in the pelvic cul-de-sac. Thick walled multiloculated fluid and gas collection in the cul-de-sac measures approximately 8.3 x 5.9 cm, decreased from prior when it measured 11 x 5 cm. The transgluteal drain appears to control this collection. Additional smaller loculated fluid collections superior to the larger collection are unchanged. Fluid and gas collection in the right ventral abdominal wall/right rectus sheath measures 4.0 x 6.0 cm (series 3 image 57). Additional fluid collection in the midline low anterior abdomen subcutaneous fat measures 4.1 x 5.0 cm. These are increased compared to prior. No frank free air. VASCULATURE: Aorta is normal in caliber. LYMPH NODES: No lymphadenopathy. REPRODUCTIVE ORGANS: No acute abnormality. BONES AND SOFT TISSUES: No acute osseous abnormality. No focal soft tissue abnormality. IMPRESSION: 1. Interval removal of previous ventral abdominal wall drains with new right transgluteal drain. The dominant fluid collection in the pelvic cul-de-sac as decreased from prior after drain placement. 2. Increasing right ventral abdominal wall/rectus sheath fluid-gas  collection and midline low anterior abdominal subcutaneous fluid collection have increased from prior. 3. Similar mild right hydroureteronephrosis with abrupt tapering of the right ureter at the pelvic collection; resolution of prior left hydroureteronephrosis. 4. Decreased sigmoid colon wall thickening and adjacent stranding where it abuts the collection. Electronically signed by: Norman Gatlin MD 12/26/2023 05:30 PM EST RP Workstation: [id number]   DG Abdomen 1 View Result Date: 12/26/2023 EXAM: 1 VIEW XRAY OF THE ABDOMEN 12/26/2023 04:18:00 PM COMPARISON: 12/16/2023 CLINICAL HISTORY: nausea vomiting abdominal pain FINDINGS: LINES, TUBES AND DEVICES: Drainage catheter in right pelvis. BOWEL: Paucity of bowel gas. No gaseous distention of bowel loops. SOFT TISSUES: No opaque urinary calculi. BONES: No acute osseous abnormality. IMPRESSION: 1. No acute findings. 2. Paucity of bowel gas, nonspecific . Electronically signed by: Rockey Kilts MD 12/26/2023 04:43 PM EST RP Workstation: [id number]      LOS: 2 days   Gordy CHRISTELLA Starch, MD 12/28/2023, 11:15 AM See [facility], [facility], to contact our on call provider

## 2023-12-28 NOTE — Anesthesia Preprocedure Evaluation (Signed)
 Anesthesia Evaluation  Patient identified by MRN, date of birth, ID band Patient awake    Reviewed: Allergy & Precautions, H&P , NPO status , Patient's Chart, lab work & pertinent test results  Airway Mallampati: II  TM Distance: >3 FB Neck ROM: Full    Dental no notable dental hx. (+) Teeth Intact, Dental Advisory Given   Pulmonary neg pulmonary ROS, former smoker, PE   Pulmonary exam normal breath sounds clear to auscultation       Cardiovascular hypertension, Pt. on medications negative cardio ROS Normal cardiovascular exam Rhythm:Regular Rate:Normal     Neuro/Psych  Headaches PSYCHIATRIC DISORDERS Anxiety Depression    negative neurological ROS  negative psych ROS   GI/Hepatic negative GI ROS, Neg liver ROS,GERD  ,,  Endo/Other  negative endocrine ROSdiabetes, Type 2  Class 3 obesity (BMI 49)  Renal/GU negative Renal ROS  negative genitourinary   Musculoskeletal negative musculoskeletal ROS (+)    Abdominal   Peds negative pediatric ROS (+)  Hematology negative hematology ROS (+) eliquis   Anesthesia Other Findings 51F h/o HTN, HLD, DM2, PE and recent complicated course following pelvic abscesses and sepsis, status post exploratory laparotomy, multiple intra-abdominal fluid collections, some partially drained with refractory nausea vomiting c/b hypokalemia  Reproductive/Obstetrics negative OB ROS                              Anesthesia Physical Anesthesia Plan  ASA: 3  Anesthesia Plan: MAC   Post-op Pain Management:    Induction: Intravenous  PONV Risk Score and Plan: Propofol infusion and Treatment may vary due to age or medical condition  Airway Management Planned: Natural Airway and Simple Face Mask  Additional Equipment: None  Intra-op Plan:   Post-operative Plan:   Informed Consent: I have reviewed the patients History and Physical, chart, labs and discussed the  procedure including the risks, benefits and alternatives for the proposed anesthesia with the patient or authorized representative who has indicated his/her understanding and acceptance.     Dental advisory given  Plan Discussed with: CRNA and Anesthesiologist  Anesthesia Plan Comments:          Anesthesia Quick Evaluation

## 2023-12-28 NOTE — Progress Notes (Signed)
 Progress Note   Assessment    33 year old with complicated course following pelvic abscesses and sepsis, status post exploratory laparotomy, multiple intra-abdominal fluid collections, some partially drained with refractory nausea vomiting  Principal Problem:   Intractable nausea and vomiting Active Problems:   Hyperlipemia   Primary hypertension   Type 2 diabetes mellitus without complication, without long-term current use of insulin  (HCC)   Tubo-ovarian abscess   AKI (acute kidney injury)   S/P exploratory laparotomy   Pulmonary embolism (HCC)   Hypokalemia   Open wound anterior abdominal wall   Recommendations   1.  Intractable nausea vomiting --refractory to multiple antiemetics IV.  It is still most likely this process is related to her intra-abdominal infection but we will proceed with EGD with MAC tomorrow to exclude proximal GI tract complicating factor -- EGD tomorrow with monitored anesthesia care with Dr. Leigh --Continue scheduled antiemetics --Continue twice daily IV PPI --The nature of the procedure, as well as the risks, benefits, and alternatives were carefully and thoroughly reviewed with the patient. Ample time for discussion and questions allowed. The patient understood, was satisfied, and agreed to proceed.  -- Will hold subcutaneous therapeutic Lovenox dose at 8 AM in the morning for procedure; will need to be restarted thereafter  2.  History of PE --on therapeutic Lovenox, see above  3.  Intra-abdominal fluid collections/abscesses/prior exploratory laparotomy --by CT fluid collections are partially drained.  Several seem to be difficult to access by IR -- Would consider ID consult  Dr. Leigh will assume care of the [facility] GI service tomorrow.    35 minutes total spent today including patient facing time, coordination of care, reviewing medical history/procedures/pertinent radiology studies, and documentation of the encounter.   Chief  Complaint   Persistent nausea and dry heaves despite IV antiemetics Still has pruritus No family at bedside today  Vital signs in last 24 hours: Temp:  [98 F (36.7 C)-99.7 F (37.6 C)] 98.4 F (36.9 C) (11/09 0756) Pulse Rate:  [89-115] 105 (11/09 0756) Resp:  [18-20] 20 (11/09 0756) BP: (131-201)/(63-136) 131/68 (11/09 0756) SpO2:  [95 %-98 %] 98 % (11/09 0756) Weight:  [128.6 kg] 128.6 kg (11/09 0725) Gen: awake, alert, NAD HEENT: anicteric  CV: tachy, regular Pulm: CTA b/l Abd: soft, diffusely tender, obese,  +BS throughout Ext: no c/c/e Neuro: nonfocal  Intake/Output from previous day: 11/08 0701 - 11/09 0700 In: 0  Out: 300 [Urine:300] Intake/Output this shift: Total I/O In: 1302.2 [I.V.:1302.2] Out: 0   Lab Results: Recent Labs    12/26/23 1447 12/27/23 0917  WBC 7.2 6.7  HGB 11.4* 10.4*  HCT 34.8* 32.2*  PLT 448* 352   BMET Recent Labs    12/26/23 1447 12/27/23 0917  NA 136 135  K 3.0* 3.4*  CL 96* 98  CO2 20* 26  GLUCOSE 164* 112*  BUN 10 12  CREATININE 1.12* 1.12*  CALCIUM  9.3 9.4   LFT Recent Labs    12/26/23 1842 12/27/23 0917  PROT 7.9 7.3  ALBUMIN 3.5 3.2*  AST 36 27  ALT 21 21  ALKPHOS 58 54  BILITOT 1.0 1.0  BILIDIR 0.3*  --   IBILI 0.7  --     Studies/Results: CT ABDOMEN PELVIS W CONTRAST Result Date: 12/26/2023 EXAM: CT ABDOMEN AND PELVIS WITH CONTRAST 12/26/2023 04:53:00 PM TECHNIQUE: CT of the abdomen and pelvis was performed with the administration of 75 mL of iohexol  (OMNIPAQUE ) 350 MG/ML injection. Multiplanar reformatted images are provided for review.  Automated exposure control, iterative reconstruction, and/or weight-based adjustment of the mA/kV was utilized to reduce the radiation dose to as low as reasonably achievable. COMPARISON: CT / 26 / 25 CLINICAL HISTORY: Abdominal pain, post-op. FINDINGS: LOWER CHEST: No acute abnormality. LIVER: Hepatic steatosis. GALLBLADDER AND BILE DUCTS: Gallbladder is unremarkable. No  biliary ductal dilatation. SPLEEN: No acute abnormality. PANCREAS: No acute abnormality. ADRENAL GLANDS: No acute abnormality. KIDNEYS, URETERS AND BLADDER: Resolution of the previous left hydroureteronephrosis. Similar mild right hydroureteronephrosis with abrupt tapering of the right ureter as it crosses the iliac vessels in the region of the pelvic fluid collection. No stones in the kidneys or ureters. No perinephric or periureteral stranding. Urinary bladder is unremarkable. GI AND BOWEL: Stomach demonstrates no acute abnormality. Decreased wall thickening and adjacent stranding about the sigmoid colon where it abuts the fluid collection. There is no bowel obstruction. PERITONEUM AND RETROPERITONEUM: Interval removal of the previous ventral abdominal wall percutaneous drains with placement of a right transgluteal drain in the pelvic cul-de-sac. Thick walled multiloculated fluid and gas collection in the cul-de-sac measures approximately 8.3 x 5.9 cm, decreased from prior when it measured 11 x 5 cm. The transgluteal drain appears to control this collection. Additional smaller loculated fluid collections superior to the larger collection are unchanged. Fluid and gas collection in the right ventral abdominal wall/right rectus sheath measures 4.0 x 6.0 cm (series 3 image 57). Additional fluid collection in the midline low anterior abdomen subcutaneous fat measures 4.1 x 5.0 cm. These are increased compared to prior. No frank free air. VASCULATURE: Aorta is normal in caliber. LYMPH NODES: No lymphadenopathy. REPRODUCTIVE ORGANS: No acute abnormality. BONES AND SOFT TISSUES: No acute osseous abnormality. No focal soft tissue abnormality. IMPRESSION: 1. Interval removal of previous ventral abdominal wall drains with new right transgluteal drain. The dominant fluid collection in the pelvic cul-de-sac as decreased from prior after drain placement. 2. Increasing right ventral abdominal wall/rectus sheath fluid-gas  collection and midline low anterior abdominal subcutaneous fluid collection have increased from prior. 3. Similar mild right hydroureteronephrosis with abrupt tapering of the right ureter at the pelvic collection; resolution of prior left hydroureteronephrosis. 4. Decreased sigmoid colon wall thickening and adjacent stranding where it abuts the collection. Electronically signed by: Norman Gatlin MD 12/26/2023 05:30 PM EST RP Workstation: [id number]   DG Abdomen 1 View Result Date: 12/26/2023 EXAM: 1 VIEW XRAY OF THE ABDOMEN 12/26/2023 04:18:00 PM COMPARISON: 12/16/2023 CLINICAL HISTORY: nausea vomiting abdominal pain FINDINGS: LINES, TUBES AND DEVICES: Drainage catheter in right pelvis. BOWEL: Paucity of bowel gas. No gaseous distention of bowel loops. SOFT TISSUES: No opaque urinary calculi. BONES: No acute osseous abnormality. IMPRESSION: 1. No acute findings. 2. Paucity of bowel gas, nonspecific . Electronically signed by: Rockey Kilts MD 12/26/2023 04:43 PM EST RP Workstation: [id number]      LOS: 2 days   Gordy CHRISTELLA Starch, MD 12/28/2023, 11:15 AM See [facility], [facility], to contact our on call provider

## 2023-12-28 NOTE — Progress Notes (Signed)
 [facility] OB/GYN Attending Progress Note  ADMISSION DIAGNOSIS:   Principal Problem:   Intractable nausea and vomiting Active Problems:   Hyperlipemia   Primary hypertension   Type 2 diabetes mellitus without complication, without long-term current use of insulin  (HCC)   Tubo-ovarian abscess   AKI (acute kidney injury)   S/P exploratory laparotomy   Pulmonary embolism (HCC)   Hypokalemia   Open wound anterior abdominal wall   Subjective  Patient feeling sleepy this a.m.  She states she has not been able to eat anything including clears.  She still notes episodes of emesis and nausea.  She otherwise reports no acute complaints   Objective  VITALS:  height is 5' 4 (1.626 m) and weight is 128.6 kg. Her oral temperature is 98.4 F (36.9 C). Her blood pressure is 131/68 and her pulse is 105 (abnormal). Her respiration is 20 and oxygen saturation is 98%.   EXAMINATION: CONSTITUTIONAL: chronically ill appearing SKIN: Skin is warm and dry. No rash noted. Not diaphoretic.  NEUROLOGIC: Alert and oriented to person, place, and time.  PSYCHIATRIC: Normal mood and flat affect.  CARDIOVASCULAR: Normal heart rate noted RESPIRATORY: Effort and breath sounds normal, no problems with respiration noted, CTAB Abd: obese, soft, appropriately tender, BS present but quiet Wound vac in place- clean and dry Ext: no calf tenderness bilaterally  Laboratory Reports: Results for orders placed or performed during the hospital encounter of 12/26/23 (from the past 72 hours)  CBC with Differential/Platelet     Status: Abnormal   Collection Time: 12/26/23  2:47 PM  Result Value Ref Range   WBC 7.2 4.0 - 10.5 K/uL   RBC 3.76 (L) 3.87 - 5.11 MIL/uL   Hemoglobin 11.4 (L) 12.0 - 15.0 g/dL   HCT 65.1 (L) 63.9 - 53.9 %   MCV 92.6 80.0 - 100.0 fL   MCH 30.3 26.0 - 34.0 pg   MCHC 32.8 30.0 - 36.0 g/dL   RDW 85.8 88.4 - 84.4 %   Platelets 448 (H) 150 - 400 K/uL   nRBC 0.0  0.0 - 0.2 %   Neutrophils Relative % 62 %   Neutro Abs 4.5 1.7 - 7.7 K/uL   Lymphocytes Relative 27 %   Lymphs Abs 1.9 0.7 - 4.0 K/uL   Monocytes Relative 9 %   Monocytes Absolute 0.7 0.1 - 1.0 K/uL   Eosinophils Relative 1 %   Eosinophils Absolute 0.1 0.0 - 0.5 K/uL   Basophils Relative 1 %   Basophils Absolute 0.1 0.0 - 0.1 K/uL   Immature Granulocytes 0 %   Abs Immature Granulocytes 0.02 0.00 - 0.07 K/uL    Comment: Performed at [facility], [address], [place], [place] [address]  Basic metabolic panel     Status: Abnormal   Collection Time: 12/26/23  2:47 PM  Result Value Ref Range   Sodium 136 135 - 145 mmol/L   Potassium 3.0 (L) 3.5 - 5.1 mmol/L   Chloride 96 (L) 98 - 111 mmol/L   CO2 20 (L) 22 - 32 mmol/L   Glucose, Bld 164 (H) 70 - 99 mg/dL    Comment: Glucose reference range applies only to samples taken after fasting for at least 8 hours.   BUN 10 6 - 20 mg/dL   Creatinine, Ser 8.87 (H) 0.44 - 1.00 mg/dL   Calcium  9.3 8.9 - 10.3 mg/dL   GFR, Estimated >39 >39 mL/min  Comment: (NOTE) Calculated using the CKD-EPI Creatinine Equation (2021)    Anion gap 20 (H) 5 - 15    Comment: Performed at [facility], [address] [place], [place] [address]  Glucose, capillary     Status: Abnormal   Collection Time: 12/26/23  2:51 PM  Result Value Ref Range   Glucose-Capillary 147 (H) 70 - 99 mg/dL    Comment: Glucose reference range applies only to samples taken after fasting for at least 8 hours.  Lipase, blood     Status: None   Collection Time: 12/26/23  6:42 PM  Result Value Ref Range   Lipase 39 11 - 51 U/L    Comment: Performed at [facility], [address] [place], [place] [address]  TSH     Status: None   Collection Time: 12/26/23  6:42 PM  Result Value Ref Range   TSH 3.170 0.350 - 4.500 uIU/mL    Comment: Performed by a 3rd Generation assay with a functional sensitivity of <=0.01 uIU/mL. Performed at [facility], [address], [place], [place] [address]   Magnesium      Status: None   Collection Time: 12/26/23  6:42 PM  Result Value Ref Range   Magnesium  2.2 1.7 - 2.4 mg/dL    Comment: Performed at [facility], [address] [place], [place] [address]  Hepatic function panel     Status: Abnormal   Collection Time: 12/26/23  6:42 PM  Result Value Ref Range   Total Protein 7.9 6.5 - 8.1 g/dL   Albumin 3.5 3.5 - 5.0 g/dL   AST 36 15 - 41 U/L    Comment: HEMOLYSIS AT THIS LEVEL MAY AFFECT RESULT   ALT 21 0 - 44 U/L    Comment: HEMOLYSIS AT THIS LEVEL MAY AFFECT RESULT   Alkaline Phosphatase 58 38 - 126 U/L   Total Bilirubin 1.0 0.0 - 1.2 mg/dL    Comment: HEMOLYSIS AT THIS LEVEL MAY AFFECT RESULT   Bilirubin, Direct 0.3 (H) 0.0 - 0.2 mg/dL   Indirect Bilirubin 0.7 0.3 - 0.9 mg/dL    Comment: Performed at [facility], [address] [place], [place] [address]  Glucose, capillary     Status: Abnormal   Collection Time: 12/26/23  8:03 PM  Result Value Ref Range   Glucose-Capillary 116 (H) 70 - 99 mg/dL    Comment: Glucose reference range applies only to samples taken after fasting for at least 8 hours.  Glucose, capillary     Status: Abnormal   Collection Time: 12/27/23 12:05 AM  Result Value Ref Range   Glucose-Capillary 112 (H) 70 - 99 mg/dL    Comment: Glucose reference range applies only to samples taken after fasting for at least 8 hours.  Glucose, capillary     Status: Abnormal   Collection Time: 12/27/23  4:10 AM  Result Value Ref Range   Glucose-Capillary 105 (H) 70 - 99 mg/dL    Comment: Glucose reference range applies only to samples taken after fasting for at least 8 hours.  Glucose, capillary     Status: Abnormal   Collection Time: 12/27/23  8:03 AM  Result Value Ref Range   Glucose-Capillary 110 (H) 70 - 99 mg/dL    Comment: Glucose reference range applies only to samples taken after fasting for at least 8 hours.  Comprehensive metabolic panel     Status: Abnormal    Collection Time: 12/27/23  9:17  AM  Result Value Ref Range   Sodium 135 135 - 145 mmol/L   Potassium 3.4 (L) 3.5 - 5.1 mmol/L   Chloride 98 98 - 111 mmol/L   CO2 26 22 - 32 mmol/L   Glucose, Bld 112 (H) 70 - 99 mg/dL    Comment: Glucose reference range applies only to samples taken after fasting for at least 8 hours.   BUN 12 6 - 20 mg/dL   Creatinine, Ser 8.87 (H) 0.44 - 1.00 mg/dL   Calcium  9.4 8.9 - 10.3 mg/dL   Total Protein 7.3 6.5 - 8.1 g/dL   Albumin 3.2 (L) 3.5 - 5.0 g/dL   AST 27 15 - 41 U/L   ALT 21 0 - 44 U/L   Alkaline Phosphatase 54 38 - 126 U/L   Total Bilirubin 1.0 0.0 - 1.2 mg/dL   GFR, Estimated >39 >39 mL/min    Comment: (NOTE) Calculated using the CKD-EPI Creatinine Equation (2021)    Anion gap 11 5 - 15    Comment: Performed at [facility], [address], [place], [place] [address]  CBC with Differential/Platelet     Status: Abnormal   Collection Time: 12/27/23  9:17 AM  Result Value Ref Range   WBC 6.7 4.0 - 10.5 K/uL   RBC 3.37 (L) 3.87 - 5.11 MIL/uL   Hemoglobin 10.4 (L) 12.0 - 15.0 g/dL   HCT 67.7 (L) 63.9 - 53.9 %   MCV 95.5 80.0 - 100.0 fL   MCH 30.9 26.0 - 34.0 pg   MCHC 32.3 30.0 - 36.0 g/dL   RDW 85.7 88.4 - 84.4 %   Platelets 352 150 - 400 K/uL   nRBC 0.0 0.0 - 0.2 %   Neutrophils Relative % 54 %   Neutro Abs 3.6 1.7 - 7.7 K/uL   Lymphocytes Relative 30 %   Lymphs Abs 2.0 0.7 - 4.0 K/uL   Monocytes Relative 12 %   Monocytes Absolute 0.8 0.1 - 1.0 K/uL   Eosinophils Relative 3 %   Eosinophils Absolute 0.2 0.0 - 0.5 K/uL   Basophils Relative 1 %   Basophils Absolute 0.0 0.0 - 0.1 K/uL   Immature Granulocytes 0 %   Abs Immature Granulocytes 0.02 0.00 - 0.07 K/uL    Comment: Performed at [facility], [address] [place], [place] [address]  Magnesium      Status: None   Collection Time: 12/27/23  9:17 AM  Result Value Ref Range   Magnesium  2.0 1.7 - 2.4 mg/dL    Comment: Performed at [facility], [address] [place], [place] [address]  Glucose, capillary     Status: Abnormal   Collection Time: 12/27/23 12:02 PM  Result Value Ref Range   Glucose-Capillary 115 (H) 70 - 99 mg/dL    Comment: Glucose reference range applies only to samples taken after fasting for at least 8 hours.  Glucose, capillary     Status: Abnormal   Collection Time: 12/27/23  3:46 PM  Result Value Ref Range   Glucose-Capillary 122 (H) 70 - 99 mg/dL    Comment: Glucose reference range applies only to samples taken after fasting for at least 8 hours.  Glucose, capillary     Status: None   Collection Time: 12/27/23  8:05 PM  Result Value Ref Range   Glucose-Capillary 99 70 - 99 mg/dL    Comment: Glucose reference range applies only to samples taken after fasting for at least 8  hours.  Glucose, capillary     Status: Abnormal   Collection Time: 12/28/23 12:13 AM  Result Value Ref Range   Glucose-Capillary 114 (H) 70 - 99 mg/dL    Comment: Glucose reference range applies only to samples taken after fasting for at least 8 hours.  Glucose, capillary     Status: Abnormal   Collection Time: 12/28/23  4:27 AM  Result Value Ref Range   Glucose-Capillary 111 (H) 70 - 99 mg/dL    Comment: Glucose reference range applies only to samples taken after fasting for at least 8 hours.  Glucose, capillary     Status: Abnormal   Collection Time: 12/28/23  8:11 AM  Result Value Ref Range   Glucose-Capillary 147 (H) 70 - 99 mg/dL    Comment: Glucose reference range applies only to samples taken after fasting for at least 8 hours.   Radiology Reports: CT ABDOMEN PELVIS W CONTRAST Result Date: 12/26/2023 EXAM: CT ABDOMEN AND PELVIS WITH CONTRAST 12/26/2023 04:53:00 PM TECHNIQUE: CT of the abdomen and pelvis was performed with the administration of 75 mL of iohexol  (OMNIPAQUE ) 350 MG/ML injection. Multiplanar reformatted images are provided for review. Automated exposure control, iterative reconstruction, and/or weight-based adjustment of the  mA/kV was utilized to reduce the radiation dose to as low as reasonably achievable. COMPARISON: CT / 26 / 25 CLINICAL HISTORY: Abdominal pain, post-op. FINDINGS: LOWER CHEST: No acute abnormality. LIVER: Hepatic steatosis. GALLBLADDER AND BILE DUCTS: Gallbladder is unremarkable. No biliary ductal dilatation. SPLEEN: No acute abnormality. PANCREAS: No acute abnormality. ADRENAL GLANDS: No acute abnormality. KIDNEYS, URETERS AND BLADDER: Resolution of the previous left hydroureteronephrosis. Similar mild right hydroureteronephrosis with abrupt tapering of the right ureter as it crosses the iliac vessels in the region of the pelvic fluid collection. No stones in the kidneys or ureters. No perinephric or periureteral stranding. Urinary bladder is unremarkable. GI AND BOWEL: Stomach demonstrates no acute abnormality. Decreased wall thickening and adjacent stranding about the sigmoid colon where it abuts the fluid collection. There is no bowel obstruction. PERITONEUM AND RETROPERITONEUM: Interval removal of the previous ventral abdominal wall percutaneous drains with placement of a right transgluteal drain in the pelvic cul-de-sac. Thick walled multiloculated fluid and gas collection in the cul-de-sac measures approximately 8.3 x 5.9 cm, decreased from prior when it measured 11 x 5 cm. The transgluteal drain appears to control this collection. Additional smaller loculated fluid collections superior to the larger collection are unchanged. Fluid and gas collection in the right ventral abdominal wall/right rectus sheath measures 4.0 x 6.0 cm (series 3 image 57). Additional fluid collection in the midline low anterior abdomen subcutaneous fat measures 4.1 x 5.0 cm. These are increased compared to prior. No frank free air. VASCULATURE: Aorta is normal in caliber. LYMPH NODES: No lymphadenopathy. REPRODUCTIVE ORGANS: No acute abnormality. BONES AND SOFT TISSUES: No acute osseous abnormality. No focal soft tissue abnormality.  IMPRESSION: 1. Interval removal of previous ventral abdominal wall drains with new right transgluteal drain. The dominant fluid collection in the pelvic cul-de-sac as decreased from prior after drain placement. 2. Increasing right ventral abdominal wall/rectus sheath fluid-gas collection and midline low anterior abdominal subcutaneous fluid collection have increased from prior. 3. Similar mild right hydroureteronephrosis with abrupt tapering of the right ureter at the pelvic collection; resolution of prior left hydroureteronephrosis. 4. Decreased sigmoid colon wall thickening and adjacent stranding where it abuts the collection. Electronically signed by: Norman Gatlin MD 12/26/2023 05:30 PM EST RP Workstation: [id number]   DG Abdomen 1 View  Result Date: 12/26/2023 EXAM: 1 VIEW XRAY OF THE ABDOMEN 12/26/2023 04:18:00 PM COMPARISON: 12/16/2023 CLINICAL HISTORY: nausea vomiting abdominal pain FINDINGS: LINES, TUBES AND DEVICES: Drainage catheter in right pelvis. BOWEL: Paucity of bowel gas. No gaseous distention of bowel loops. SOFT TISSUES: No opaque urinary calculi. BONES: No acute osseous abnormality. IMPRESSION: 1. No acute findings. 2. Paucity of bowel gas, nonspecific . Electronically signed by: Rockey Kilts MD 12/26/2023 04:43 PM EST RP Workstation: [id number]    I personally reviewed Labs and Imaging Studies under Results section.    ASSESSMENT/PLAN: 1) Intractable nausea/vomiting -currently on IV Reglan , Compazine , zofran , protonix  and scop patch -appreciate GI input regarding further recommendations -continue daily weights and strict I/Os  2) Pulmonary embolism -on IM Lovenox due to inability to tolerate po  3) Pelvic abscess- s/p Exlap on 10/20 - Patient afebrile with no leukocytosis - Drain and wound VAC doing well - Based on imaging completed 11/7, evidence that abscess has decreased in size   4) T2DM -currently on sliding scale  5) Hypertension: -currently IV Hydralazine 10  every 6 hr -home BP meds held due to inability to tolerate po (Losartan 50mg  daily and Chlorthalidone  25mg  daily and Amlodipine 5mg  daily)  6) FEN -concern for nutritional status due to inability to tolerate po -will review with medicine team- consult ordered -D5 LR @ 150cc/hr -BMP pendint this am  DVT Prophylaxis:  on Lovenox Code Status: Full Disposition Plan: as outlined above

## 2023-12-29 ENCOUNTER — Inpatient Hospital Stay (HOSPITAL_COMMUNITY)
Admission: RE | Admit: 2023-12-29 | Discharge: 2023-12-29 | Disposition: A | Discharge status: Home / Self Care | Payer: Self-pay | Source: Ambulatory Visit

## 2023-12-29 ENCOUNTER — Encounter (HOSPITAL_COMMUNITY)
Admission: AD | Disposition: A | Discharge status: Home / Self Care | Payer: Self-pay | Source: Home / Self Care | Attending: Obstetrics & Gynecology

## 2023-12-29 ENCOUNTER — Inpatient Hospital Stay (HOSPITAL_COMMUNITY): Payer: MEDICAID | Admitting: Anesthesiology

## 2023-12-29 ENCOUNTER — Encounter (HOSPITAL_COMMUNITY): Payer: Self-pay | Admitting: Family Medicine

## 2023-12-29 ENCOUNTER — Inpatient Hospital Stay (HOSPITAL_COMMUNITY): Payer: Self-pay | Admitting: Anesthesiology

## 2023-12-29 ENCOUNTER — Ambulatory Visit: Payer: Self-pay | Admitting: Obstetrics and Gynecology

## 2023-12-29 ENCOUNTER — Ambulatory Visit (HOSPITAL_COMMUNITY)
Admission: RE | Admit: 2023-12-29 | Discharge: 2023-12-29 | Disposition: A | Discharge status: Home / Self Care | Payer: Self-pay | Source: Ambulatory Visit

## 2023-12-29 DIAGNOSIS — Z87891 Personal history of nicotine dependence: Secondary | ICD-10-CM

## 2023-12-29 DIAGNOSIS — N739 Female pelvic inflammatory disease, unspecified: Secondary | ICD-10-CM | POA: Insufficient documentation

## 2023-12-29 DIAGNOSIS — K449 Diaphragmatic hernia without obstruction or gangrene: Secondary | ICD-10-CM

## 2023-12-29 DIAGNOSIS — K3189 Other diseases of stomach and duodenum: Secondary | ICD-10-CM

## 2023-12-29 DIAGNOSIS — I10 Essential (primary) hypertension: Secondary | ICD-10-CM

## 2023-12-29 DIAGNOSIS — K319 Disease of stomach and duodenum, unspecified: Secondary | ICD-10-CM

## 2023-12-29 HISTORY — PX: ESOPHAGOGASTRODUODENOSCOPY: SHX5428

## 2023-12-29 LAB — BASIC METABOLIC PANEL WITH GFR
Anion gap: 14 (ref 5–15)
BUN: 7 mg/dL (ref 6–20)
CO2: 24 mmol/L (ref 22–32)
Calcium: 8.6 mg/dL — ABNORMAL LOW (ref 8.9–10.3)
Chloride: 98 mmol/L (ref 98–111)
Creatinine, Ser: 1.45 mg/dL — ABNORMAL HIGH (ref 0.44–1.00)
GFR, Estimated: 49 mL/min — ABNORMAL LOW (ref >60)
Glucose, Bld: 94 mg/dL (ref 70–99)
Potassium: 3.5 mmol/L (ref 3.5–5.1)
Sodium: 136 mmol/L (ref 135–145)

## 2023-12-29 LAB — CBC WITH DIFFERENTIAL/PLATELET
Abs Immature Granulocytes: 0.03 K/uL (ref 0.00–0.07)
Basophils Absolute: 0 K/uL (ref 0.0–0.1)
Basophils Relative: 1 %
Eosinophils Absolute: 0.1 K/uL (ref 0.0–0.5)
Eosinophils Relative: 2 %
HCT: 27.1 % — ABNORMAL LOW (ref 36.0–46.0)
Hemoglobin: 8.9 g/dL — ABNORMAL LOW (ref 12.0–15.0)
Immature Granulocytes: 1 %
Lymphocytes Relative: 32 %
Lymphs Abs: 1.7 K/uL (ref 0.7–4.0)
MCH: 31 pg (ref 26.0–34.0)
MCHC: 32.8 g/dL (ref 30.0–36.0)
MCV: 94.4 fL (ref 80.0–100.0)
Monocytes Absolute: 0.6 K/uL (ref 0.1–1.0)
Monocytes Relative: 11 %
Neutro Abs: 3 K/uL (ref 1.7–7.7)
Neutrophils Relative %: 53 %
Platelets: 226 K/uL (ref 150–400)
RBC: 2.87 MIL/uL — ABNORMAL LOW (ref 3.87–5.11)
RDW: 14.5 % (ref 11.5–15.5)
WBC: 5.5 K/uL (ref 4.0–10.5)
nRBC: 0 % (ref 0.0–0.2)

## 2023-12-29 LAB — GLUCOSE, CAPILLARY
Glucose-Capillary: 123 mg/dL — ABNORMAL HIGH (ref 70–99)
Glucose-Capillary: 105 mg/dL — ABNORMAL HIGH (ref 70–99)
Glucose-Capillary: 104 mg/dL — ABNORMAL HIGH (ref 70–99)
Glucose-Capillary: 95 mg/dL (ref 70–99)

## 2023-12-29 LAB — MAGNESIUM: Magnesium: 1.8 mg/dL (ref 1.7–2.4)

## 2023-12-29 SURGERY — EGD (ESOPHAGOGASTRODUODENOSCOPY)
Anesthesia: Monitor Anesthesia Care

## 2023-12-29 MED ORDER — SODIUM CHLORIDE 0.9 % IV SOLN
INTRAVENOUS | Status: AC | PRN
Start: 2023-12-29 — End: 2023-12-29
  Administered 2023-12-29: 500 mL via INTRAVENOUS

## 2023-12-29 MED ORDER — BISACODYL 10 MG RE SUPP: 10.0000 mg | Freq: Once | RECTAL | Status: DC | PRN

## 2023-12-29 MED ORDER — LIDOCAINE 2% (20 MG/ML) 5 ML SYRINGE
INTRAMUSCULAR | Status: DC | PRN
  Administered 2023-12-29: 100 mg via INTRAVENOUS

## 2023-12-29 MED ORDER — LORAZEPAM 2 MG/ML IJ SOLN
1.0000 mg | Freq: Once | INTRAMUSCULAR | Status: AC
Start: 2023-12-30 — End: 2023-12-29
  Administered 2023-12-29: 1 mg via INTRAVENOUS
  Filled 2023-12-29: qty 1

## 2023-12-29 MED ORDER — PROPOFOL 10 MG/ML IV BOLUS
INTRAVENOUS | Status: DC | PRN
  Administered 2023-12-29: 60 mg via INTRAVENOUS
  Administered 2023-12-29: 100 ug/kg/min via INTRAVENOUS

## 2023-12-29 MED ORDER — IOHEXOL 350 MG/ML SOLN
75.0000 mL | Freq: Once | INTRAVENOUS | Status: AC | PRN
  Administered 2023-12-29: 75 mL via INTRAVENOUS

## 2023-12-29 MED ORDER — ONDANSETRON HCL 4 MG/2ML IJ SOLN
INTRAMUSCULAR | Status: DC | PRN
  Administered 2023-12-29: 4 mg via INTRAVENOUS

## 2023-12-29 MED ORDER — LACTATED RINGERS IV SOLN: INTRAVENOUS | Status: DC

## 2023-12-29 MED ORDER — DIPHENHYDRAMINE HCL 50 MG/ML IJ SOLN
12.5000 mg | Freq: Three times a day (TID) | INTRAMUSCULAR | Status: DC | PRN
  Administered 2023-12-29: 12.5 mg via INTRAVENOUS
  Filled 2023-12-29: qty 1

## 2023-12-29 NOTE — Consult Note (Signed)
 WOC Nurse Consult Note: Reason for Consult: Requested to change VAC dressing. Wound type: surgical midline. Pressure Injury POA: NA Measurement: 5 cm x 3 cm x 0.2 cm Wound bed: 80% red, 20% yellow slough. Drainage (amount, consistency, odor) minimum amount in canister, serosanguinous. Periwound: intact. Dressing procedure/placement/frequency: Cleanse with saline. Small peel and place dressing change and plug to a home health machine. Sealed NPWT dressing at HG  Patient tolerated procedure well  WOC nurse will continue to provide NPWT dressing changed due to the complexity of the dressing change.   WOC team will follow WED. Please reconsult if further assistance is needed. Thank-you,  Lela Holm MSN, RN, CNS.  (Phone [phone])

## 2023-12-29 NOTE — Progress Notes (Signed)
 PHARMACY - ANTICOAGULATION CONSULT NOTE  Pharmacy Consult for enoxaparin  Indication: pulmonary embolus and bilateral DVT  Allergies  Allergen Reactions   Macrobid  [Nitrofurantoin  Macrocrystal] Itching    Caused the patient to feel hot.    Patient Measurements: Height: 5' 4 (162.6 cm) Weight:  (pt refused at this time) IBW/kg (Calculated) : 54.7 HEPARIN DW (KG): 86.5  Vital Signs: Temp: 97.9 F (36.6 C) (11/10 1131) Temp Source: Oral (11/10 1131) BP: 138/66 (11/10 1131) Pulse Rate: 100 (11/10 1131)  Labs: Recent Labs    12/27/23 0917 12/28/23 1221 12/28/23 1235 12/29/23 0428  HGB 10.4*  --  10.2* 8.9*  HCT 32.2*  --  31.2* 27.1*  PLT 352  --  345 226  CREATININE 1.12* 1.39*  --  1.45*    Estimated Creatinine Clearance: 72.8 mL/min (A) (by C-G formula based on SCr of 1.45 mg/dL (H)).   Medical History: Past Medical History:  Diagnosis Date   Anxiety    Back pain    Chlamydia    Depression med made her navel itching and  made her sleepy so she quit taking them   Diabetes mellitus without complication (HCC)    Edema, lower extremity    Elevated cholesterol    Fibrocystic breast changes of both breasts    GERD (gastroesophageal reflux disease)    History of cesarean section, classical 12/18/2012   2014    Human papilloma virus    Hypertension    Insomnia    Joint pain    Moderate dysplasia of cervix    Numbness    right arm to hand   Ovarian cyst    Prediabetes    Tubo-ovarian abscess 12/02/2023   Vitamin D  deficiency     Medications:  Scheduled:   enoxaparin (LOVENOX) injection  120 mg Subcutaneous Q12H   glycopyrrolate   0.1 mg Intravenous TID   hydrALAZINE  10 mg Intravenous Q6H   insulin  aspart  0-20 Units Subcutaneous Q4H   metoCLOPramide  (REGLAN ) injection  10 mg Intravenous Q6H   ondansetron  (ZOFRAN ) IV  4 mg Intravenous Q6H   pantoprazole  (PROTONIX ) IV  40 mg Intravenous Q12H   scopolamine   1 patch Transdermal Q72H    Assessment: Ms.  Misty Walsh is a 33 YO female found to have PE and bilateral DVTs on 10/21 on admission for sepsis secondary to pelvic abscesses. She was started on heparin drip at that time. Transitioned to Eliquis on 10/23 and then switched to heparin drip for IR drain placement on 10/29. She was discharged home on Eliquis on 12/23/23.   She was admitted on 12/26/23 with intractable nausea and vomiting. She was scheduled for EGD today. Last enoxaparin 120mg  dose was 12/28/23 @ 20:23. Anesthesia end time is 11/28/23 @ 09:30.  Goal of Therapy:    Monitor platelets by anticoagulation protocol: Yes   Plan:  Lovenox 120mg  q12h continued therapy starting 12 hours after AET.   Richerd Wendee Hata 12/29/2023,12:00 PM

## 2023-12-29 NOTE — Op Note (Signed)
 [facility] Patient Name: Misty Walsh Procedure Date : 12/29/2023 MRN: [id number] Attending MD: Elspeth SQUIBB. Leigh , MD, [id number] Date of Birth: [birth year] CSN: [id number] Age: 33 Admit Type: Outpatient Procedure:                Upper GI endoscopy Indications:              Nausea with vomiting - history pelvic abscesses /                            drainage / surgery, history of PE on lovenox Providers:                Elspeth SQUIBB. Leigh, MD, Heather Ng, RN,                            Haskel Chris, Technician Referring MD:              Medicines:                Monitored Anesthesia Care Complications:            No immediate complications. Estimated blood loss:                            Minimal. Estimated Blood Loss:     Estimated blood loss was minimal. Procedure:                Pre-Anesthesia Assessment:                           - Prior to the procedure, a History and Physical                            was performed, and patient medications and                            allergies were reviewed. The patient's tolerance of                            previous anesthesia was also reviewed. The risks                            and benefits of the procedure and the sedation                            options and risks were discussed with the patient.                            All questions were answered, and informed consent                            was obtained. Prior Anticoagulants: The patient has                            taken Lovenox (enoxaparin), last dose was 1 day  prior to procedure. ASA Grade Assessment: III - A                            patient with severe systemic disease. After                            reviewing the risks and benefits, the patient was                            deemed in satisfactory condition to undergo the                            procedure.                           After obtaining  informed consent, the endoscope was                            passed under direct vision. Throughout the                            procedure, the patient's blood pressure, pulse, and                            oxygen saturations were monitored continuously. The                            [id number]-[id number] ([id number]) Olympus endoscope was introduced                            through the mouth, and advanced to the second part                            of duodenum. The upper GI endoscopy was                            accomplished without difficulty. The patient                            tolerated the procedure well. Scope In: Scope Out: Findings:      Esophagogastric landmarks were identified: the Z-line was found at 37       cm, the gastroesophageal junction was found at 37 cm and the upper       extent of the gastric folds was found at 40 cm from the incisors.      A 3 cm hiatal hernia was present.      The exam of the esophagus was otherwise normal.      An area of localized erythematous mucosa was found in the gastric fundus       - given distribution / location suspect reactive due to wretching /       vomiting      The exam of the stomach was otherwise normal. No outlet obstruction.      Biopsies were taken with a cold forceps for Helicobacter pylori testing.      The  examined duodenum was normal. Impression:               - Esophagogastric landmarks identified.                           - 3 cm hiatal hernia.                           - Normal esophagus otherwise.                           - Erythematous mucosa in the gastric fundus very                            likely reactive and related to vomiting / wretching                           - Normal stomach otherwise - patent pyloric                            channel. Biopsies taken to rule out H pylori                           - Normal examined duodenum.                           Overall, no primary gastric / upper tract pathology                             to account for nausea / vomiting, which is more                            likely due to her acute illness / intra-abdominal                            infection. Recommendation:           - Return patient to hospital ward for ongoing care.                           - Advance diet as tolerated.                           - Continue present medications / supportive care.                           - Continue antiemetics / Reglan                            - Please avoid / minimize narcotic use if at all                            possible                           - Await pathology results.                           -  Consider ID consult for intra-abdominal fluid                            collections / history of abscess or IR consultation                            if collections amenable to drainage.                           - GI will sign off for now, call with additional                            questions moving forward Procedure Code(s):        --- Professional ---                           [phone], Esophagogastroduodenoscopy, flexible,                            transoral; with biopsy, single or multiple Diagnosis Code(s):        --- Professional ---                           K44.9, Diaphragmatic hernia without obstruction or                            gangrene                           K31.89, Other diseases of stomach and duodenum                           R11.2, Nausea with vomiting, unspecified CPT copyright 2022 [employer]. All rights reserved. The codes documented in this report are preliminary and upon coder review may  be revised to meet current compliance requirements. Elspeth P. Leigh, MD 12/29/2023 9:26:47 AM This report has been signed electronically. Number of Addenda: 0

## 2023-12-29 NOTE — Progress Notes (Signed)
 Gynecology Progress Note  Admission Date: 12/26/2023 Current Date: 12/29/2023 10:58 AM  Misty Walsh is a 33 y.o. G1P0101 admitted for acute on chronic n/v.   History complicated by: Patient Active Problem List   Diagnosis Date Noted   Gastric erythema 12/29/2023   Pulmonary embolism (HCC) 12/27/2023   Hypokalemia 12/27/2023   Open wound anterior abdominal wall 12/27/2023   Intractable nausea and vomiting 12/26/2023   Sepsis (HCC) 12/09/2023   AKI (acute kidney injury) 12/09/2023   Acute hypoxic respiratory failure (HCC) 12/09/2023   S/P exploratory laparotomy 12/09/2023   Tubo-ovarian abscess 12/02/2023   Acute pyelonephritis 12/02/2023   BMI 50.0-59.9, adult (HCC) 12/02/2023   Obesity, class 3 (HCC) 12/02/2023   Sepsis due to gram-negative UTI (HCC) 12/01/2023   Metabolic dysfunction-associated steatotic liver disease (MASLD) 12/26/2022   Class 3 severe obesity with serious comorbidity and body mass index (BMI) of 50.0 to 59.9 in adult (HCC) 11/20/2022   Physically inactive 11/20/2022   Cyst of right ovary 06/05/2022   Episodic cluster headache, not intractable 04/09/2022   Hypoventilation associated with obesity syndrome (HCC) 04/09/2022   Sleep-related headache 04/09/2022   Loud snoring 04/09/2022   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/2022   Anxiety and depression 04/09/2022   Adnexal mass 03/20/2022   Type 2 diabetes mellitus without complication, without long-term current use of insulin  (HCC) 02/08/2021   Symptomatic mammary hypertrophy 12/07/2019   Back pain 12/07/2019   Neck pain 12/07/2019   Prediabetes 11/04/2019   Hyperlipemia 11/04/2019   Vitamin D  deficiency 11/04/2019   Primary hypertension 03/22/2019   Hemorrhagic ovarian cyst 06/21/2017   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   Cellulitis 01/06/2013   History of cesarean section, classical 12/18/2012   Allergy 10/29/2012   ROS and patient/family/surgical history, located on  admission H&P note dated 12/26/2023, have been reviewed, and there are no changes except as noted below Yesterday/Overnight Events:  Continue emesis  Subjective:  Patient just back from GI EGD (negative per GI). She states she had an episode of emesis earlier today and when she was getting the EGD. She states the emesis is yellow, mucus like and no brown or black or green.   Pain stable and pt still feels nauseous. No VB, chest pain, sob, fevers, chills.   Objective:    Current Vital Signs 24h Vital Sign Ranges  T 99 F (37.2 C) Temp  Avg: 98.5 F (36.9 C)  Min: 98.2 F (36.8 C)  Max: 99 F (37.2 C)  BP (!) 149/85 BP  Min: 123/70  Max: 151/75  HR 97 Pulse  Avg: 100.3  Min: 96  Max: 104  RR 18 Resp  Avg: 20.3  Min: 18  Max: 27  SaO2 97 % Room Air SpO2  Avg: 96.2 %  Min: 94 %  Max: 98 %       24 Hour I/O Current Shift I/O  Time Ins Outs 11/09 0701 - 11/10 0700 In: 2720.1 [I.V.:2715.1] Out: 410 [Urine:400; Drains:10] 11/10 0701 - 11/10 1900 In: 100 [I.V.:100] Out: -    Patient Vitals for the past 12 hrs:  BP Temp Temp src Pulse Resp SpO2  12/29/23 0945 (!) 149/85 -- -- 97 18 97 %  12/29/23 0930 (!) 144/92 -- -- 96 (!) 27 96 %  12/29/23 0925 125/75 -- -- -- 20 97 %  12/29/23 0835 (!) 150/96 99 F (37.2 C) Oral (!) 103 (!) 22 97 %  12/29/23 0727 (!) 151/75 98.2 F (36.8  C) Oral 98 18 --  12/29/23 0440 135/61 -- -- (!) 102 19 94 %  12/28/23 2319 (!) 151/73 98.3 F (36.8 C) Oral (!) 103 20 97 %   Physical exam: General appearance: alert, cooperative, and appears stated age Abdomen: obese, rare bowel sounds, wound vac in place and functioning correctly, nttp Lungs: clear to auscultation bilaterally Heart: S1, S2 normal, no murmur, rub or gallop, regular rate and rhythm Skin: warm and dryl Psych: appropriate Neurologic: Grossly normal  Transgluteal drain with sero-sang fluid, approximately 10mL  Medications Current Facility-Administered Medications  Medication Dose  Route Frequency Provider Last Rate Last Admin   enoxaparin (LOVENOX) injection 120 mg  120 mg Subcutaneous Q12H Pyrtle, Gordy HERO, MD   120 mg at 12/28/23 2023   glycopyrrolate  (ROBINUL ) injection 0.1 mg  0.1 mg Intravenous TID Fredirick Glenys RAMAN, MD   0.1 mg at 12/29/23 1008   guaiFENesin  (ROBITUSSIN) 100 MG/5ML liquid 15 mL  15 mL Oral Q4H PRN Fredirick Glenys RAMAN, MD       hydrALAZINE (APRESOLINE) injection 10 mg  10 mg Intravenous Q6H Cleatus Moccasin, MD   10 mg at 12/29/23 1007   HYDROmorphone  (DILAUDID ) injection 0.2-0.6 mg  0.2-0.6 mg Intravenous Q2H PRN Fredirick Glenys RAMAN, MD   0.6 mg at 12/29/23 1009   insulin  aspart (novoLOG) injection 0-20 Units  0-20 Units Subcutaneous Q4H Fredirick Glenys RAMAN, MD   3 Units at 12/29/23 9560   lactated ringers  infusion   Intravenous Continuous Fredirick Glenys RAMAN, MD       menthol  (CEPACOL) lozenge 3 mg  1 lozenge Oral Q2H PRN Fredirick Glenys RAMAN, MD       metoCLOPramide  (REGLAN ) injection 10 mg  10 mg Intravenous Q6H Pratt, Tanya S, MD   10 mg at 12/29/23 0723   metoprolol tartrate (LOPRESSOR) injection 5 mg  5 mg Intravenous Q5 min PRN Cleatus Moccasin, MD       ondansetron  (ZOFRAN ) injection 4 mg  4 mg Intravenous Q6H Fredirick Glenys RAMAN, MD   4 mg at 12/29/23 9276   pantoprazole  (PROTONIX ) injection 40 mg  40 mg Intravenous Q12H Esterwood, Amy S, PA-C   40 mg at 12/29/23 1005   polyethylene glycol (MIRALAX  / GLYCOLAX ) packet 17 g  17 g Oral Daily PRN Fredirick Glenys RAMAN, MD       prochlorperazine  (COMPAZINE ) injection 10 mg  10 mg Intravenous Q6H PRN Fredirick Glenys RAMAN, MD   10 mg at 12/29/23 0433   scopolamine  (TRANSDERM-SCOP) 1 MG/3DAYS 1 mg  1 patch Transdermal Q72H Fredirick Glenys RAMAN, MD   1 mg at 12/26/23 1851   Labs  Recent Labs  Lab 12/27/23 0917 12/28/23 1235 12/29/23 0428  WBC 6.7 7.4 5.5  HGB 10.4* 10.2* 8.9*  HCT 32.2* 31.2* 27.1*  PLT 352 345 226    Recent Labs  Lab 12/26/23 1842 12/27/23 0917 12/28/23 1221 12/29/23 0428  NA  --  135 136 136  K  --  3.4* 3.2* 3.5  CL  --  98  98 98  CO2  --  26 25 24   BUN  --  12 9 7   CREATININE  --  1.12* 1.39* 1.45*  CALCIUM   --  9.4 8.8* 8.6*  PROT 7.9 7.3  --   --   BILITOT 1.0 1.0  --   --   ALKPHOS 58 54  --   --   ALT 21 21  --   --   AST 36 27  --   --  GLUCOSE  --  112* 93 94   Radiology No new imaging  Assessment & Plan:  Patient stable *GYN: not on abx anymore for TOA/pelvic abscesses. Smaller but still present abscesses on admit CT. Continue with IR drain for now. Latter's drain culture negative on final with just abundant PMNs, negative GS WOC consulted ordered for [place] changes. Patient states it was last changed on day of last discharge (11/4) Hospitalist team following along for below co-morbidities. *GI: s/p EGD this morning with negative findings; biopsies done at the time. S/s felt to be most likey from her acute illness / fluid collections and recommend to continue supportive measures and try to avoid narcotics if at all possible with her vomiting. Continue with IV meds until better able to take some PO. No BM since admission and pt offered PR meds. No e/o bowel obstruction or mention of large stool burden on admit CT.  *HTN: see above *DM2: see above.  *Pain: controlled with IV meds *FEN/GI: clears ordered, on MIVF, replete electrolytes PRN *Heme: continue lovenox until able to take PO. Pharmacy consult ordered due to BMI *AKI: stable  Code Status: Full Code  Total time taking care of the patient was 35 minutes, with greater than 50% of the time spent in face to face interaction with the patient.  Bebe Izell Raddle MD Attending Center for [facility] (Faculty Practice) GYN Consult Phone: [phone] (M-F, 0800-1700) & [phone] (Off hours, weekends, holidays)

## 2023-12-29 NOTE — Progress Notes (Signed)
 Consultation Progress Note   Patient: Misty Walsh FMW:[id number] DOB: 05/16/1990 DOA: 12/26/2023 DOS: the patient was seen and examined on 12/29/2023 Primary service: Izell Harari, MD  Brief hospital course: 13F h/o HTN, HLD, DM2, PE and recent complicated course following pelvic abscesses and sepsis, status post exploratory laparotomy, multiple intra-abdominal fluid collections, some partially drained with refractory nausea vomiting c/b hypokalemia.   Assessment and Plan: Hypokalemia -F/u BMP daily; replete prn; goal K >4 -F/u Mg daily; replete prn; goal Mg >2   Intractable n/v - Status Post EGD -No acute findings, normal esophagus noted. -Continue antiemetics as needed   HTN -Continue IV hydralazine 10mg  QID for now -HOLD pta amlodipine, chlothalidone and losartan for now while ongoing intractable n/v; pt normotensive without these meds; resume prn   HLD -HOLD PTA atorvastatin 80mg  daily and resume at d/c   DM2 -SSI TID AC alone for now -F/u A1c; if >10, then consult diabetes coordinator           [facility] will continue to follow the patient.  Subjective: Seen at bedside, family present. She reports nausea, currently NPO  She denies abdominal pain vomiting fever or chills.  Physical Exam: Vitals:   12/29/23 0925 12/29/23 0930 12/29/23 0945 12/29/23 1131  BP: 125/75 (!) 144/92 (!) 149/85 138/66  Pulse:  96 97 100  Resp: 20 (!) 27 18 18   Temp:    97.9 F (36.6 C)  TempSrc:    Oral  SpO2: 97% 96% 97% 95%  Weight:      Height:       Gen: NAD, alert, cooperative with exam HEENT: NCAT CV: RRR, good S1/S2, no murmur Resp: CTABL, no wheezes, non-labored Ext: No edema, warm Neuro: Alert and oriented, No gross deficits  Data Reviewed:    CBC    Component Value Date/Time   WBC 5.5 12/29/2023 0428   RBC 2.87 (L) 12/29/2023 0428   HGB 8.9 (L) 12/29/2023 0428   HGB 13.5 11/20/2022 0930   HCT 27.1 (L) 12/29/2023 0428   HCT 41.8 11/20/2022 0930   PLT 226  12/29/2023 0428   PLT 233 11/20/2022 0930   MCV 94.4 12/29/2023 0428   MCV 95 11/20/2022 0930   MCH 31.0 12/29/2023 0428   MCHC 32.8 12/29/2023 0428   RDW 14.5 12/29/2023 0428   RDW 12.3 11/20/2022 0930   LYMPHSABS 1.7 12/29/2023 0428   LYMPHSABS 1.8 11/20/2022 0930   MONOABS 0.6 12/29/2023 0428   EOSABS 0.1 12/29/2023 0428   EOSABS 0.1 11/20/2022 0930   BASOSABS 0.0 12/29/2023 0428   BASOSABS 0.1 11/20/2022 0930   CMP     Component Value Date/Time   NA 136 12/29/2023 0428   NA 139 11/20/2022 0930   K 3.5 12/29/2023 0428   CL 98 12/29/2023 0428   CO2 24 12/29/2023 0428   GLUCOSE 94 12/29/2023 0428   BUN 7 12/29/2023 0428   BUN 12 11/20/2022 0930   CREATININE 1.45 (H) 12/29/2023 0428   CALCIUM  8.6 (L) 12/29/2023 0428   PROT 7.3 12/27/2023 0917   PROT 7.0 11/20/2022 0930   ALBUMIN 3.2 (L) 12/27/2023 0917   ALBUMIN 4.3 11/20/2022 0930   AST 27 12/27/2023 0917   ALT 21 12/27/2023 0917   ALKPHOS 54 12/27/2023 0917   BILITOT 1.0 12/27/2023 0917   BILITOT 0.4 11/20/2022 0930   EGFR 84 11/20/2022 0930   GFRNONAA 49 (L) 12/29/2023 0428    Family Communication:   Time spent: 25 minutes.  Author: Landon FORBES Baller, MD  12/29/2023 12:52 PM  For on call review [email or web address].

## 2023-12-29 NOTE — Interval H&P Note (Signed)
 History and Physical Interval Note: EGD to evaluate refractory  nausea / vomiting. Patient states no changes since she was seen yesterday. Continues to have intermittent nausea / vomiting, does not feel well. I discussed EGD with her, risks / benefits, she understands and wishes to proceed. Further recommendations pending results.   12/29/2023 8:45 AM  Misty Walsh  has presented today for surgery, with the diagnosis of Intractable nausea and vomiting.  The various methods of treatment have been discussed with the patient and family. After consideration of risks, benefits and other options for treatment, the patient has consented to  Procedure(s): EGD (ESOPHAGOGASTRODUODENOSCOPY) (N/A) as a surgical intervention.  The patient's history has been reviewed, patient examined, no change in status, stable for surgery.  I have reviewed the patient's chart and labs.  Questions were answered to the patient's satisfaction.     Elspeth P Carmine Carrozza

## 2023-12-29 NOTE — Transfer of Care (Signed)
 Immediate Anesthesia Transfer of Care Note  Patient: Misty Walsh  Procedure(s) Performed: EGD (ESOPHAGOGASTRODUODENOSCOPY)  Patient Location: PACU  Anesthesia Type:MAC  Level of Consciousness: awake, alert , and oriented  Airway & Oxygen Therapy: Patient Spontanous Breathing  Post-op Assessment: Report given to RN and Post -op Vital signs reviewed and stable  Post vital signs: Reviewed and stable  Last Vitals:  Vitals Value Taken Time  BP 125/75 0929  Temp    Pulse 97 0929  Resp 12 0929  SpO2 100 0929    Last Pain:  Vitals:   12/29/23 0835  TempSrc: Oral  PainSc: 9          Complications: No notable events documented.

## 2023-12-30 ENCOUNTER — Inpatient Hospital Stay (HOSPITAL_COMMUNITY): Payer: MEDICAID

## 2023-12-30 ENCOUNTER — Other Ambulatory Visit: Payer: Self-pay

## 2023-12-30 LAB — CBC WITH DIFFERENTIAL/PLATELET
Abs Immature Granulocytes: 0.02 K/uL (ref 0.00–0.07)
Basophils Absolute: 0 K/uL (ref 0.0–0.1)
Basophils Relative: 1 %
Eosinophils Absolute: 0.1 K/uL (ref 0.0–0.5)
Eosinophils Relative: 1 %
HCT: 28.7 % — ABNORMAL LOW (ref 36.0–46.0)
Hemoglobin: 9.5 g/dL — ABNORMAL LOW (ref 12.0–15.0)
Immature Granulocytes: 0 %
Lymphocytes Relative: 31 %
Lymphs Abs: 1.6 K/uL (ref 0.7–4.0)
MCH: 31.7 pg (ref 26.0–34.0)
MCHC: 33.1 g/dL (ref 30.0–36.0)
MCV: 95.7 fL (ref 80.0–100.0)
Monocytes Absolute: 0.5 K/uL (ref 0.1–1.0)
Monocytes Relative: 10 %
Neutro Abs: 3 K/uL (ref 1.7–7.7)
Neutrophils Relative %: 57 %
Platelets: 307 K/uL (ref 150–400)
RBC: 3 MIL/uL — ABNORMAL LOW (ref 3.87–5.11)
RDW: 14.5 % (ref 11.5–15.5)
WBC: 5.3 K/uL (ref 4.0–10.5)
nRBC: 0 % (ref 0.0–0.2)

## 2023-12-30 LAB — MAGNESIUM: Magnesium: 1.8 mg/dL (ref 1.7–2.4)

## 2023-12-30 LAB — BASIC METABOLIC PANEL WITH GFR
Anion gap: 16 — ABNORMAL HIGH (ref 5–15)
BUN: 6 mg/dL (ref 6–20)
CO2: 22 mmol/L (ref 22–32)
Calcium: 8.6 mg/dL — ABNORMAL LOW (ref 8.9–10.3)
Chloride: 99 mmol/L (ref 98–111)
Creatinine, Ser: 1.41 mg/dL — ABNORMAL HIGH (ref 0.44–1.00)
GFR, Estimated: 50 mL/min — ABNORMAL LOW (ref >60)
Glucose, Bld: 90 mg/dL (ref 70–99)
Potassium: 3.2 mmol/L — ABNORMAL LOW (ref 3.5–5.1)
Sodium: 137 mmol/L (ref 135–145)

## 2023-12-30 LAB — GLUCOSE, CAPILLARY
Glucose-Capillary: 104 mg/dL — ABNORMAL HIGH (ref 70–99)
Glucose-Capillary: 107 mg/dL — ABNORMAL HIGH (ref 70–99)
Glucose-Capillary: 107 mg/dL — ABNORMAL HIGH (ref 70–99)
Glucose-Capillary: 114 mg/dL — ABNORMAL HIGH (ref 70–99)
Glucose-Capillary: 91 mg/dL (ref 70–99)
Glucose-Capillary: 95 mg/dL (ref 70–99)

## 2023-12-30 LAB — POCT CBG MONITORING
Glucose-Capillary: 107
Glucose-Capillary: 107

## 2023-12-30 LAB — SURGICAL PATHOLOGY

## 2023-12-30 MED ORDER — LACTATED RINGERS IV SOLN: INTRAVENOUS | Status: AC

## 2023-12-30 MED ORDER — ONDANSETRON HCL 4 MG/2ML IJ SOLN
4.0000 mg | Freq: Four times a day (QID) | INTRAMUSCULAR | Status: DC
  Administered 2023-12-30 – 2024-01-04 (×19): 4 mg via INTRAVENOUS
  Filled 2023-12-30 (×19): qty 2

## 2023-12-30 MED ORDER — SODIUM CHLORIDE 0.9 % IV SOLN
8.0000 mg | Freq: Four times a day (QID) | INTRAVENOUS | Status: DC
  Administered 2023-12-30: 8 mg via INTRAVENOUS
  Filled 2023-12-30 (×4): qty 4

## 2023-12-30 MED ORDER — POTASSIUM CHLORIDE 10 MEQ/100ML IV SOLN
10.0000 meq | INTRAVENOUS | Status: AC
  Administered 2023-12-30 (×3): 10 meq via INTRAVENOUS
  Filled 2023-12-30 (×3): qty 100

## 2023-12-30 MED ORDER — DIPHENHYDRAMINE HCL 50 MG/ML IJ SOLN
25.0000 mg | Freq: Four times a day (QID) | INTRAMUSCULAR | Status: DC | PRN
  Administered 2023-12-30 – 2024-01-04 (×3): 25 mg via INTRAVENOUS
  Filled 2023-12-30 (×3): qty 1

## 2023-12-30 MED ORDER — GADOBUTROL 1 MMOL/ML IV SOLN
10.0000 mL | Freq: Once | INTRAVENOUS | Status: AC | PRN
  Administered 2023-12-30: 10 mL via INTRAVENOUS

## 2023-12-30 MED ORDER — LORAZEPAM 2 MG/ML IJ SOLN
1.0000 mg | Freq: Four times a day (QID) | INTRAMUSCULAR | Status: DC
  Administered 2023-12-30 – 2024-01-05 (×22): 1 mg via INTRAVENOUS
  Filled 2023-12-30 (×8): qty 1
  Filled 2023-12-30: qty 0.5
  Filled 2023-12-30 (×15): qty 1

## 2023-12-30 MED ORDER — POTASSIUM CHLORIDE 10 MEQ/100ML IV SOLN
10.0000 meq | Freq: Once | INTRAVENOUS | Status: AC
  Administered 2023-12-30: 10 meq via INTRAVENOUS
  Filled 2023-12-30: qty 100

## 2023-12-30 MED ORDER — SODIUM CHLORIDE 0.9% FLUSH
10.0000 mL | INTRAVENOUS | Status: DC | PRN
  Administered 2023-12-31: 10 mL

## 2023-12-30 MED ORDER — ALPRAZOLAM 0.5 MG PO TABS
0.5000 mg | ORAL_TABLET | Freq: Once | ORAL | Status: AC
  Administered 2023-12-30: 0.5 mg via ORAL
  Filled 2023-12-30: qty 1

## 2023-12-30 MED ORDER — CHLORHEXIDINE GLUCONATE CLOTH 2 % EX PADS
6.0000 | MEDICATED_PAD | Freq: Every day | CUTANEOUS | Status: DC
  Administered 2023-12-30 – 2024-01-08 (×9): 6 via TOPICAL

## 2023-12-30 NOTE — Progress Notes (Signed)
 Consultation Progress Note   Patient: Misty Walsh FMW:[id number] DOB: [birth year] DOA: 12/26/2023 DOS: the patient was seen and examined on 12/30/2023 Primary service: Eveline Lynwood MATSU, MD  Brief hospital course: 54F h/o HTN, HLD, DM2, PE and recent complicated course following pelvic abscesses and sepsis, status post exploratory laparotomy, multiple intra-abdominal fluid collections, some partially drained with refractory nausea vomiting c/b hypokalemia.  [facility] hospitalist asked to consult on this patient.  Assessment and Plan: Hypokalemia Replace as needed   Intractable n/v Possibly multifactorial Repeat CT chest/abdomen/pelvis pending Status Post EGD, no obvious reason for intractable nausea/vomiting Continue antiemetics as needed, PPI Further workup pending repeat CT/abdomen/pelvis  AKI Likely 2/2 poor oral intake, ensure patient is not retaining urine Creatinine now 1.4, baseline WNL Continue IV fluid  PE BLE DVT Continue therapeutic Lovenox until able to tolerate by mouth   HTN BP stable Continue IV hydralazine 10mg  QID, monitor closely, IV metoprolol as needed HOLD pta amlodipine, chlothalidone and losartan for now while ongoing intractable n/v   HLD HOLD PTA atorvastatin 80mg  daily and resume at d/c   DM2 Last A1c is 6.3 on 11/2023 SSI, Accu-Cheks, hypoglycemic protocol  Morbid obesity   History of recurrent pelvic/tubo-ovarian abscess Noted wound dehiscence S/p ex lap, drain placement Management per GYN      [facility] will continue to follow the patient.  Subjective:  Reports not feeling any better.  Still with nausea/vomiting  Physical Exam: Vitals:   12/30/23 0005 12/30/23 0803 12/30/23 0855 12/30/23 1410  BP: 136/70 (!) 122/56  135/68  Pulse: (!) 112 (!) 103  (!) 104  Resp: 16 17  (!) 22  Temp: 98.4 F (36.9 C) 98.6 F (37 C)  98.5 F (36.9 C)  TempSrc: Oral Oral  Oral  SpO2: 95%  95% 96%  Weight:      Height:       General: NAD, morbid  obesity Cardiovascular: S1, S2 present Respiratory: CTAB Abdomen: Soft, nontender, nondistended, bowel sounds present, wound VAC in place, noted drain Musculoskeletal: No bilateral pedal edema noted Skin: Noted above Psychiatry: Fair mood   Data Reviewed:    CBC    Component Value Date/Time   WBC 5.3 12/30/2023 0425   RBC 3.00 (L) 12/30/2023 0425   HGB 9.5 (L) 12/30/2023 0425   HGB 13.5 11/20/2022 0930   HCT 28.7 (L) 12/30/2023 0425   HCT 41.8 11/20/2022 0930   PLT 307 12/30/2023 0425   PLT 233 11/20/2022 0930   MCV 95.7 12/30/2023 0425   MCV 95 11/20/2022 0930   MCH 31.7 12/30/2023 0425   MCHC 33.1 12/30/2023 0425   RDW 14.5 12/30/2023 0425   RDW 12.3 11/20/2022 0930   LYMPHSABS 1.6 12/30/2023 0425   LYMPHSABS 1.8 11/20/2022 0930   MONOABS 0.5 12/30/2023 0425   EOSABS 0.1 12/30/2023 0425   EOSABS 0.1 11/20/2022 0930   BASOSABS 0.0 12/30/2023 0425   BASOSABS 0.1 11/20/2022 0930   CMP     Component Value Date/Time   NA 137 12/30/2023 0425   NA 139 11/20/2022 0930   K 3.2 (L) 12/30/2023 0425   CL 99 12/30/2023 0425   CO2 22 12/30/2023 0425   GLUCOSE 90 12/30/2023 0425   BUN 6 12/30/2023 0425   BUN 12 11/20/2022 0930   CREATININE 1.41 (H) 12/30/2023 0425   CALCIUM  8.6 (L) 12/30/2023 0425   PROT 7.3 12/27/2023 0917   PROT 7.0 11/20/2022 0930   ALBUMIN 3.2 (L) 12/27/2023 0917   ALBUMIN 4.3 11/20/2022 0930  AST 27 12/27/2023 0917   ALT 21 12/27/2023 0917   ALKPHOS 54 12/27/2023 0917   BILITOT 1.0 12/27/2023 0917   BILITOT 0.4 11/20/2022 0930   EGFR 84 11/20/2022 0930   GFRNONAA 50 (L) 12/30/2023 0425    Family Communication: None at bedside    Author: Lebron JINNY Cage, MD 12/30/2023 2:26 PM  For on call review [email or web address].

## 2023-12-30 NOTE — Anesthesia Postprocedure Evaluation (Signed)
 Anesthesia Post Note  Patient: Misty Walsh  Procedure(s) Performed: EGD (ESOPHAGOGASTRODUODENOSCOPY)     Patient location during evaluation: Endoscopy Anesthesia Type: MAC Level of consciousness: awake and alert Pain management: pain level controlled Vital Signs Assessment: post-procedure vital signs reviewed and stable Respiratory status: spontaneous breathing, nonlabored ventilation, respiratory function stable and patient connected to nasal cannula oxygen Cardiovascular status: stable and blood pressure returned to baseline Postop Assessment: no apparent nausea or vomiting Anesthetic complications: no   There were no known notable events for this encounter.  Last Vitals:  Vitals:   12/30/23 0005 12/30/23 0803  BP: 136/70 (!) 122/56  Pulse: (!) 112 (!) 103  Resp: 16 17  Temp: 36.9 C 37 C  SpO2: 95%     Last Pain:  Vitals:   12/30/23 0855  TempSrc:   PainSc: 7                  Bintou Lafata L Renatha Rosen

## 2023-12-30 NOTE — Progress Notes (Signed)
 Peripherally Inserted Central Catheter Placement  The IV Nurse has discussed with the patient and/or persons authorized to consent for the patient, the purpose of this procedure and the potential benefits and risks involved with this procedure.  The benefits include less needle sticks, lab draws from the catheter, and the patient may be discharged home with the catheter. Risks include, but not limited to, infection, bleeding, blood clot (thrombus formation), and puncture of an artery; nerve damage and irregular heartbeat and possibility to perform a PICC exchange if needed/ordered by physician.  Alternatives to this procedure were also discussed.  Bard Power PICC patient education guide, fact sheet on infection prevention and patient information card has been provided to patient /or left at bedside.    PICC Placement Documentation  PICC Single Lumen 12/30/23 Right Cephalic 43 cm 0 cm (Active)  Indication for Insertion or Continuance of Line Prolonged intravenous therapies;Poor Vasculature-patient has had multiple peripheral attempts or PIVs lasting less than 24 hours 12/30/23 1455  Exposed Catheter (cm) 0 cm 12/30/23 1455  Site Assessment Clean, Dry, Intact 12/30/23 1455  Line Status Flushed;Saline locked;Blood return noted 12/30/23 1455  Dressing Type Transparent;Securing device 12/30/23 1455  Dressing Status Antimicrobial disc/dressing in place;Clean, Dry, Intact 12/30/23 1455  Line Care Connections checked and tightened 12/30/23 1455  Line Adjustment (NICU/IV Team Only) No 12/30/23 1455  Dressing Intervention New dressing;Adhesive placed at insertion site (IV team only) 12/30/23 1455  Dressing Change Due 01/06/24 12/30/23 1455       Daizha Anand 12/30/2023, 2:56 PM

## 2023-12-30 NOTE — Progress Notes (Signed)
 Gynecology Progress Note  Admission Date: 12/26/2023 Current Date: 12/30/2023 12:24 PM  Misty Walsh is a 33 y.o. G1P0101 admitted for acute on chronic n/v.   History complicated by: Patient Active Problem List   Diagnosis Date Noted   Gastric erythema 12/29/2023   Pulmonary embolism (HCC) 12/27/2023   Hypokalemia 12/27/2023   Open wound anterior abdominal wall 12/27/2023   Intractable nausea and vomiting 12/26/2023   Sepsis (HCC) 12/09/2023   AKI (acute kidney injury) 12/09/2023   Acute hypoxic respiratory failure (HCC) 12/09/2023   S/P exploratory laparotomy 12/09/2023   Tubo-ovarian abscess 12/02/2023   Acute pyelonephritis 12/02/2023   BMI 50.0-59.9, adult (HCC) 12/02/2023   Obesity, class 3 (HCC) 12/02/2023   Sepsis due to gram-negative UTI (HCC) 12/01/2023   Metabolic dysfunction-associated steatotic liver disease (MASLD) 12/26/2022   Class 3 severe obesity with serious comorbidity and body mass index (BMI) of 50.0 to 59.9 in adult (HCC) 11/20/2022   Physically inactive 11/20/2022   Cyst of right ovary 06/05/2022   Episodic cluster headache, not intractable 04/09/2022   Hypoventilation associated with obesity syndrome (HCC) 04/09/2022   Sleep-related headache 04/09/2022   Loud snoring 04/09/2022   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/2022   Anxiety and depression 04/09/2022   Adnexal mass 03/20/2022   Type 2 diabetes mellitus without complication, without long-term current use of insulin  (HCC) 02/08/2021   Symptomatic mammary hypertrophy 12/07/2019   Back pain 12/07/2019   Neck pain 12/07/2019   Prediabetes 11/04/2019   Hyperlipemia 11/04/2019   Vitamin D  deficiency 11/04/2019   Primary hypertension 03/22/2019   Hemorrhagic ovarian cyst 06/21/2017   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   Cellulitis 01/06/2013   History of cesarean section, classical 12/18/2012   Allergy 10/29/2012   ROS and patient/family/surgical history, located on  admission H&P note dated 12/26/2023, have been reviewed, and there are no changes except as noted below Yesterday/Overnight Events:  Continue emesis. Had EGD which was normal.   Subjective:  Patient reports nausea and emesis with movement (denies dizziness). Reports hiccups start and then emesis or if she yawns. She has not been able to tolerate PO. She feels like the robinul  might be drying her out and that she might feel nauseated after taking it. She also reports headaches that seem new to her.   She also feels depressed. She doesn't think she has depression and doesn't think talking to someone would help. She is depressed about how sick she feels and just wants to be better.   No VB, chest pain, sob, fevers, chills.   Objective:    Current Vital Signs 24h Vital Sign Ranges  T 98.6 F (37 C) Temp  Avg: 98.4 F (36.9 C)  Min: 98.2 F (36.8 C)  Max: 98.6 F (37 C)  BP (!) 122/56 (Jillian, RN present at bedside) BP  Min: 122/56  Max: 143/69  HR (!) 103 Pulse  Avg: 105.5  Min: 102  Max: 112  RR 17 Resp  Avg: 17.3  Min: 16  Max: 18  SaO2 95 % Room Air SpO2  Avg: 95.6 %  Min: 95 %  Max: 97 %       24 Hour I/O Current Shift I/O  Time Ins Outs 11/10 0701 - 11/11 0700 In: 1098.5 [I.V.:1098.5] Out: 0  No intake/output data recorded.   Patient Vitals for the past 12 hrs:  BP Temp Temp src Pulse Resp SpO2  12/30/23 0855 -- -- -- -- -- 95 %  12/30/23  0803 (!) 122/56 98.6 F (37 C) Oral (!) 103 17 --   Physical exam: General appearance: alert, cooperative, and appears stated age Abdomen: obese, rare bowel sounds, wound vac in place and functioning correctly, nttp Lungs: clear to auscultation bilaterally Heart: S1, S2 normal, no murmur, rub or gallop, regular rate and rhythm Skin: warm and dryl Psych: appropriate Neurologic: Grossly normal Abd: Incision c/d/I with wound vac over upper part of the incision.   Transgluteal drain with sero-sang fluid, approximately  10-35mL  Medications Current Facility-Administered Medications  Medication Dose Route Frequency Provider Last Rate Last Admin   bisacodyl (DULCOLAX) suppository 10 mg  10 mg Rectal Once PRN Izell Harari, MD       diphenhydrAMINE  (BENADRYL ) injection 25 mg  25 mg Intravenous Q6H PRN Cleatus Moccasin, MD       enoxaparin (LOVENOX) injection 120 mg  120 mg Subcutaneous Q12H Pyrtle, Gordy HERO, MD   120 mg at 12/30/23 0931   guaiFENesin  (ROBITUSSIN) 100 MG/5ML liquid 15 mL  15 mL Oral Q4H PRN Pratt, Tanya S, MD       hydrALAZINE (APRESOLINE) injection 10 mg  10 mg Intravenous Q6H Cleatus Moccasin, MD   10 mg at 12/30/23 1047   HYDROmorphone  (DILAUDID ) injection 0.2-0.6 mg  0.2-0.6 mg Intravenous Q2H PRN Pratt, Tanya S, MD   0.6 mg at 12/30/23 9182   insulin  aspart (novoLOG) injection 0-20 Units  0-20 Units Subcutaneous Q4H Fredirick Glenys RAMAN, MD   3 Units at 12/29/23 9560   lactated ringers  infusion   Intravenous Continuous Eveline Lynwood MATSU, MD 150 mL/hr at 12/30/23 0550 New Bag at 12/30/23 0550   LORazepam (ATIVAN) injection 1 mg  1 mg Intravenous Q6H Cleatus Moccasin, MD   1 mg at 12/30/23 1044   menthol  (CEPACOL) lozenge 3 mg  1 lozenge Oral Q2H PRN Pratt, Tanya S, MD       metoCLOPramide  (REGLAN ) injection 10 mg  10 mg Intravenous Q6H Pratt, Tanya S, MD   10 mg at 12/30/23 0752   metoprolol tartrate (LOPRESSOR) injection 5 mg  5 mg Intravenous Q5 min PRN Cleatus Moccasin, MD       ondansetron  (ZOFRAN ) 8 mg in sodium chloride  0.9 % 50 mL IVPB  8 mg Intravenous Q6H Cleatus Moccasin, MD 216 mL/hr at 12/30/23 1159 8 mg at 12/30/23 1159   pantoprazole  (PROTONIX ) injection 40 mg  40 mg Intravenous Q12H Esterwood, Amy S, PA-C   40 mg at 12/30/23 0920   polyethylene glycol (MIRALAX  / GLYCOLAX ) packet 17 g  17 g Oral Daily PRN Fredirick Glenys RAMAN, MD       potassium chloride  10 mEq in 100 mL IVPB  10 mEq Intravenous Q1 Hr x 4 Ezenduka, Nkeiruka J, MD 100 mL/hr at 12/30/23 1052 10 mEq at 12/30/23 1052   prochlorperazine   (COMPAZINE ) injection 10 mg  10 mg Intravenous Q6H PRN Fredirick Glenys RAMAN, MD   10 mg at 12/30/23 0512   scopolamine  (TRANSDERM-SCOP) 1 MG/3DAYS 1 mg  1 patch Transdermal Q72H Fredirick Glenys RAMAN, MD   1 mg at 12/29/23 1732   Labs  Recent Labs  Lab 12/28/23 1235 12/29/23 0428 12/30/23 0425  WBC 7.4 5.5 5.3  HGB 10.2* 8.9* 9.5*  HCT 31.2* 27.1* 28.7*  PLT 345 226 307    Recent Labs  Lab 12/26/23 1842 12/27/23 0917 12/28/23 1221 12/29/23 0428 12/30/23 0425  NA  --  135 136 136 137  K  --  3.4* 3.2* 3.5 3.2*  CL  --  98 98 98 99  CO2  --  26 25 24 22   BUN  --  12 9 7 6   CREATININE  --  1.12* 1.39* 1.45* 1.41*  CALCIUM   --  9.4 8.8* 8.6* 8.6*  PROT 7.9 7.3  --   --   --   BILITOT 1.0 1.0  --   --   --   ALKPHOS 58 54  --   --   --   ALT 21 21  --   --   --   AST 36 27  --   --   --   GLUCOSE  --  112* 93 94 90   Radiology No new imaging  Assessment & Plan:  Patient stable *GYN:  - Not on abx anymore for TOA/pelvic abscesses. Smaller but still present abscesses on admit CT. Continue with IR drain for now. Had CT yesterday read is currently pending.  WOC consulted ordered for [facility] changes. Patient states it was last changed on day of last discharge (11/4) - Hospitalist team following along for below co-morbidities. - No fever or elevated WBC to suggest infection.   *GI: s/p EGD with negative findings; biopsies done at the time. S/s felt to be most likey from her acute illness / fluid collections and recommend to continue supportive measures and try to avoid narcotics if at all possible with her vomiting. Continue with IV meds until better able to take some PO. No BM since admission and pt offered PR meds. No e/o bowel obstruction or mention of large stool burden on admit CT. Given intractable nature of N/V, will consult neurology. Will try ativan to help with N/V and anxiety as well. We will try holding Robinul . If this is worse, will resume it and try to hold Scop patch. I have also  increased her Zofran , she does not have prolonged QTC on review of her EKG.   *Elevated Cr: Unclear etiology at this time, but stable over last several days.   *HTN: Appreciate management by hospitalist team.  *DM2: see above.  *Pain: controlled with IV meds *FEN/GI: clears ordered, on MIVF, replete electrolytes PRN *Heme: continue lovenox until able to take PO. Hgb stable. *AKI: stable  IV access lost. IV team recommends against midline. Recommended she have PICC. PICC team consulted.   Code Status: Full Code  Total time taking care of the patient was 35 minutes, with greater than 50% of the time spent in face to face interaction with the patient.  Bebe Izell Raddle MD Attending [facility] GYN Consult Phone: [phone] (M-F, 0800-1700) & [phone] (Off hours, weekends, holidays)

## 2023-12-30 NOTE — Progress Notes (Signed)
 Assessed for PIV placement , veins are very small & deep. No suitable for PIV and midline.RN aware and will notify MD.

## 2023-12-30 NOTE — Progress Notes (Signed)
 2316- This nurse notified provider Eveline MD that the patient had reported to this nurse that her heart was racing. Patient's HR was in the 112-114's. Provider stated that he would be on his way down to come and assess the patient. This nurse went back into the patient's room to ask if patient was experiencing any chest pain or tightness she stated no, my heart just feels like it's racing. This nurse then asked patient if she has a history of anxiety, and patient stated yea I have anxiety, anxiety about when I am going to throw up again. Then I'm tired of that medicine their giving for my spitting all its doing is making to where I can't throw up when I need to. I do have anxiety though. This nurse expressed her apologies for her feelings and asked patient what she had taken for her anxiety in the past, patient was unable to recall but stated Im getting tired of laying here, throwing up, sleeping, laying here, throwing up, and sleeping. Then I can't see my son or be at home because these doctor's aren't doing anything different and they don't know what's wrong with me, but they want try anything new; and all I see are these four walls and keep giving medicine that don't help me, At least last time I was here I could eat a little, this ain't me this ain't my life. This nurse apologized for the patients feelings profusely and gave emotional support, patient stated I just appreciate you sitting in here with me so I wouldn't go into a panic attack, this nurse offered to request medication for anxiety and patient was agreeable.  [id number] Florence Eveline MD to inform him of what patient just expressed to this nurse and requested for medication for anxiety; order for 1 mg Ativan IV received. Provider on way to assess patient.

## 2023-12-31 ENCOUNTER — Encounter (HOSPITAL_COMMUNITY): Payer: Self-pay | Admitting: Gastroenterology

## 2023-12-31 DIAGNOSIS — I2699 Other pulmonary embolism without acute cor pulmonale: Secondary | ICD-10-CM

## 2023-12-31 DIAGNOSIS — S31109D Unspecified open wound of abdominal wall, unspecified quadrant without penetration into peritoneal cavity, subsequent encounter: Secondary | ICD-10-CM

## 2023-12-31 DIAGNOSIS — N179 Acute kidney failure, unspecified: Secondary | ICD-10-CM

## 2023-12-31 DIAGNOSIS — E119 Type 2 diabetes mellitus without complications: Secondary | ICD-10-CM

## 2023-12-31 DIAGNOSIS — E876 Hypokalemia: Secondary | ICD-10-CM

## 2023-12-31 LAB — BASIC METABOLIC PANEL WITH GFR
Anion gap: 12 (ref 5–15)
BUN: 6 mg/dL (ref 6–20)
CO2: 25 mmol/L (ref 22–32)
Calcium: 8.2 mg/dL — ABNORMAL LOW (ref 8.9–10.3)
Chloride: 99 mmol/L (ref 98–111)
Creatinine, Ser: 1.27 mg/dL — ABNORMAL HIGH (ref 0.44–1.00)
GFR, Estimated: 57 mL/min — ABNORMAL LOW (ref >60)
Glucose, Bld: 84 mg/dL (ref 70–99)
Potassium: 3.1 mmol/L — ABNORMAL LOW (ref 3.5–5.1)
Sodium: 136 mmol/L (ref 135–145)

## 2023-12-31 LAB — CBC WITH DIFFERENTIAL/PLATELET
Abs Immature Granulocytes: 0.02 K/uL (ref 0.00–0.07)
Basophils Absolute: 0 K/uL (ref 0.0–0.1)
Basophils Relative: 1 %
Eosinophils Absolute: 0.2 K/uL (ref 0.0–0.5)
Eosinophils Relative: 3 %
HCT: 27.7 % — ABNORMAL LOW (ref 36.0–46.0)
Hemoglobin: 9 g/dL — ABNORMAL LOW (ref 12.0–15.0)
Immature Granulocytes: 0 %
Lymphocytes Relative: 26 %
Lymphs Abs: 1.3 K/uL (ref 0.7–4.0)
MCH: 31.7 pg (ref 26.0–34.0)
MCHC: 32.5 g/dL (ref 30.0–36.0)
MCV: 97.5 fL (ref 80.0–100.0)
Monocytes Absolute: 0.7 K/uL (ref 0.1–1.0)
Monocytes Relative: 14 %
Neutro Abs: 2.8 K/uL (ref 1.7–7.7)
Neutrophils Relative %: 56 %
Platelets: 256 K/uL (ref 150–400)
RBC: 2.84 MIL/uL — ABNORMAL LOW (ref 3.87–5.11)
RDW: 14.2 % (ref 11.5–15.5)
WBC: 5.1 K/uL (ref 4.0–10.5)
nRBC: 0 % (ref 0.0–0.2)

## 2023-12-31 LAB — GLUCOSE, CAPILLARY
Glucose-Capillary: 79 mg/dL (ref 70–99)
Glucose-Capillary: 82 mg/dL (ref 70–99)
Glucose-Capillary: 83 mg/dL (ref 70–99)
Glucose-Capillary: 86 mg/dL (ref 70–99)

## 2023-12-31 LAB — MAGNESIUM: Magnesium: 1.8 mg/dL (ref 1.7–2.4)

## 2023-12-31 MED ORDER — POTASSIUM CHLORIDE 10 MEQ/100ML IV SOLN
10.0000 meq | INTRAVENOUS | Status: AC
  Administered 2023-12-31 (×2): 10 meq via INTRAVENOUS
  Filled 2023-12-31 (×2): qty 100

## 2023-12-31 MED ORDER — POTASSIUM CHLORIDE 10 MEQ/100ML IV SOLN
10.0000 meq | Freq: Once | INTRAVENOUS | Status: AC
  Administered 2023-12-31: 10 meq via INTRAVENOUS
  Filled 2023-12-31: qty 100

## 2023-12-31 MED ORDER — GLYCOPYRROLATE 1 MG PO TABS
1.0000 mg | ORAL_TABLET | Freq: Two times a day (BID) | ORAL | Status: DC
  Administered 2023-12-31 – 2024-01-01 (×2): 1 mg via ORAL
  Filled 2023-12-31 (×5): qty 1

## 2023-12-31 MED ORDER — MAGNESIUM SULFATE 2 GM/50ML IV SOLN
2.0000 g | Freq: Once | INTRAVENOUS | Status: AC
  Administered 2023-12-31: 2 g via INTRAVENOUS
  Filled 2023-12-31: qty 50

## 2023-12-31 MED ORDER — BOOST / RESOURCE BREEZE PO LIQD CUSTOM
1.0000 | Freq: Three times a day (TID) | ORAL | Status: DC
  Administered 2024-01-01: 1 via ORAL
  Filled 2023-12-31 (×7): qty 1

## 2023-12-31 MED ORDER — ADULT MULTIVITAMIN W/MINERALS CH
1.0000 | ORAL_TABLET | Freq: Every day | ORAL | Status: DC
  Filled 2023-12-31 (×6): qty 1

## 2023-12-31 MED ORDER — LACTATED RINGERS IV SOLN: INTRAVENOUS | Status: AC

## 2023-12-31 NOTE — Progress Notes (Signed)
 Home wound vac unit changed to hospital wound vac.

## 2023-12-31 NOTE — Progress Notes (Addendum)
 [facility]                                                                               Misty Walsh, is a 33 y.o. female, DOB - 1990/11/19, FMW:[id number] Admit date - 12/26/2023    Outpatient Primary MD for the patient is Buck Search, PA-C  LOS - 5  days    Brief summary    68F h/o HTN, HLD, DM2, PE and recent complicated course following pelvic abscesses and sepsis, status post exploratory laparotomy, multiple intra-abdominal fluid collections, some partially drained with refractory nausea vomiting c/b hypokalemia.  [facility] asked to consult on this patient.   CT chest, abdomen and pelvis showing Similar right adnexal abscess with percutaneous drain in place. Subcutaneous collection of fluid and air involving the right rectus abdominus muscle as has decreased slightly in size in the interval and is worrisome for an abscess.  Retro umbilical fluid collection is stable to minimally enlarged and may represent a seroma. Difficult to exclude abscess. Persistent moderate right hydronephrosis with decreased renal function, likely related to the aforementioned pelvic abscess.  Assessment & Plan   Intractable nausea and vomiting leading to persistent hypokalemia S/p EGD by gastroenterology.  Showing erythematous mucosa in the stomach likely reactive and related to vomiting and retching. Persistent nausea and vomiting probably from persistent abscesses seen on the repeat CT abdomen and pelvis. Patient is currently on Robinul  1 mg twice daily,  lorazepam 1 mg every 6 hours,  Reglan  10 mg every 6 hours,  Zofran  4 mg every 6 hours and prochlorperazine  10 mg every 6 hours as needed Scopolamine  patch 1 mg. She is also on Protonix  IV 40 mg twice daily Currently on clear liquid diet, tolerating.    Abdominal wall abscess/ IR consulted for drain and fluid to be sent for analysis.    Hypertension Blood pressure parameters are optimal.    Type 2 diabetes  mellitus CBG (last 3)  Recent Labs    12/31/23 0043 12/31/23 0449 12/31/23 0846  GLUCAP 82 79 86   A1c 6.3   History of pulmonary embolism  on Lovenox injections as patient is not able to tolerate meds orally   AKI likely secondary to poor oral intake and patient was also found to have right moderate hydronephrosis from the pelvic abscesses Creatinine slowly improving.  Currently it is at 1.2. Recommend to continue with IV fluids.  Repeat BMP in the morning   Hypokalemia and hypomagnesemia Replaced, repeat in the morning    Normocytic anemia/anemia of acute illness/anemia of blood loss Baseline hemoglobin around 12, dropped to 10.4 to 9. Anemia panel reviewed  Transfuse to keep it greater than 7    Medications  Scheduled Meds:  Chlorhexidine Gluconate Cloth  6 each Topical Daily   enoxaparin (LOVENOX) injection  120 mg Subcutaneous Q12H   glycopyrrolate   1 mg Oral BID   hydrALAZINE  10 mg Intravenous Q6H   insulin  aspart  0-20 Units Subcutaneous Q4H   LORazepam  1 mg Intravenous Q6H   metoCLOPramide  (REGLAN ) injection  10 mg Intravenous Q6H   ondansetron  (ZOFRAN ) IV  4 mg Intravenous Q6H   pantoprazole  (PROTONIX ) IV  40  mg Intravenous Q12H   scopolamine   1 patch Transdermal Q72H   Continuous Infusions:  lactated ringers  50 mL/hr at 12/31/23 0604   PRN Meds:.bisacodyl, diphenhydrAMINE , guaiFENesin , HYDROmorphone  (DILAUDID ) injection, menthol , metoprolol tartrate, polyethylene glycol, prochlorperazine , sodium chloride  flush    Subjective:   Misty Walsh was seen and examined today.   Feeling sad because she came to know that CPS is being called for her special needs child and with everything is going on, she reports being overwhelmed.  Nausea is slowly improving. Still spitting up.  Objective:   Vitals:   12/31/23 0313 12/31/23 0828 12/31/23 0829 12/31/23 1052  BP: (!) 145/60  (!) 156/73 (!) 158/81  Pulse: (!) 108  98 100  Resp: (!) 22     Temp:  99.5  F (37.5 C)    TempSrc:  Oral    SpO2: 95% 97%    Height:        Intake/Output Summary (Last 24 hours) at 12/31/2023 1342 Last data filed at 12/31/2023 1149 Gross per 24 hour  Intake 5 ml  Output 273 ml  Net -268 ml   Filed Weights     Exam  General exam: Appears calm and comfortable  Respiratory system: Clear to auscultation. Respiratory effort normal. Cardiovascular system: S1 & S2 heard, RRR.  Gastrointestinal system: Abdomen is soft bowel sounds heard, JP drain in place Central nervous system: Alert and oriented. Extremities: Symmetric 5 x 5 power. Skin: No rashes,  Psychiatry: Emotional  Data Reviewed:  I have personally reviewed following labs and imaging studies   CBC Lab Results  Component Value Date   WBC 5.1 12/31/2023   RBC 2.84 (L) 12/31/2023   HGB 9.0 (L) 12/31/2023   HCT 27.7 (L) 12/31/2023   MCV 97.5 12/31/2023   MCH 31.7 12/31/2023   PLT 256 12/31/2023   MCHC 32.5 12/31/2023   RDW 14.2 12/31/2023   LYMPHSABS 1.3 12/31/2023   MONOABS 0.7 12/31/2023   EOSABS 0.2 12/31/2023   BASOSABS 0.0 12/31/2023     Last metabolic panel Lab Results  Component Value Date   NA 136 12/31/2023   K 3.1 (L) 12/31/2023   CL 99 12/31/2023   CO2 25 12/31/2023   BUN 6 12/31/2023   CREATININE 1.27 (H) 12/31/2023   GLUCOSE 84 12/31/2023   GFRNONAA 57 (L) 12/31/2023   GFRAA 127 11/03/2019   CALCIUM  8.2 (L) 12/31/2023   PHOS 2.7 12/10/2023   PROT 7.3 12/27/2023   ALBUMIN 3.2 (L) 12/27/2023   LABGLOB 2.7 11/20/2022   AGRATIO 2.0 05/25/2020   BILITOT 1.0 12/27/2023   ALKPHOS 54 12/27/2023   AST 27 12/27/2023   ALT 21 12/27/2023   ANIONGAP 12 12/31/2023    CBG (last 3)  Recent Labs    12/31/23 0043 12/31/23 0449 12/31/23 0846  GLUCAP 82 79 86      Coagulation Profile: No results for input(s): INR, PROTIME in the last 168 hours.   Radiology Studies: MR MRV HEAD W WO CONTRAST Result Date: 12/31/2023 CLINICAL DATA:  Initial evaluation for  headache, intractable nausea and vomiting. EXAM: MR VENOGRAM HEAD WITHOUT AND WITH CONTRAST TECHNIQUE: Angiographic images of the intracranial venous structures were acquired using MRV technique without and with intravenous contrast. CONTRAST:  10mL GADAVIST GADOBUTROL 1 MMOL/ML IV SOLN COMPARISON:  Comparison made with brain MRI performed at the same time. FINDINGS: Examination degraded by motion artifact. Normal flow related signal and enhancement seen throughout the superior sagittal sinus to the torcula. Transverse and sigmoid sinuses are  patent as are the jugular bulbs and visualized proximal internal jugular veins. Straight sinus, vein of Galen, and internal cerebral veins are patent. No evidence for dural venous sinus thrombosis. No appreciable abnormality about the cavernous sinus. Superior orbital veins are symmetric and within normal limits. No appreciable cortical vein abnormality. IMPRESSION: Negative intracranial MRV. No evidence for dural venous sinus thrombosis. Electronically Signed   By: Morene Hoard M.D.   On: 12/31/2023 00:24   MR BRAIN W WO CONTRAST Result Date: 12/31/2023 CLINICAL DATA:  Initial evaluation for headache, intractable nausea and vomiting. EXAM: MRI HEAD WITHOUT AND WITH CONTRAST TECHNIQUE: Multiplanar, multiecho pulse sequences of the brain and surrounding structures were obtained without and with intravenous contrast. CONTRAST:  10mL GADAVIST GADOBUTROL 1 MMOL/ML IV SOLN COMPARISON:  None Available. FINDINGS: Brain: Examination degraded by motion artifact. Cerebral volume within normal limits. Few subcentimeter FLAIR hyperintensity seen involving the periventricular white matter, nonspecific, but overall minimal in nature. No evidence for acute or subacute infarct. No areas of chronic cortical infarction. No acute or chronic intracranial blood products. No mass lesion, midline shift or mass effect. No hydrocephalus or extra-axial fluid collection. Pituitary gland  within normal limits. No abnormal enhancement. Vascular: Major intracranial vascular flow voids are maintained. 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 Craniocervical junction otherwise unremarkable. Decreased T1 signal intensity within the visualized bone marrow, nonspecific, but most commonly related to anemia, smoking, or obesity. No scalp soft tissue abnormality. Sinuses/Orbits: Globes and orbital soft tissues within normal limits. Mild scattered mucosal thickening present about the sphenoid ethmoidal sinuses. Paranasal sinuses are otherwise clear. No mastoid effusion. Other: None. IMPRESSION: 1. No acute intracranial abnormality. 2. 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 3. Otherwise essentially normal and unremarkable brain MRI. Electronically Signed   By: Morene Hoard M.D.   On: 12/31/2023 00:18   US  EKG SITE RITE Result Date: 12/30/2023 If Site Rite image not attached, placement could not be confirmed due to current cardiac rhythm.      Elgie Butter M.D. [facility] 12/31/2023, 1:42 PM  Available via Epic secure chat 7am-7pm After 7 pm, please refer to night coverage provider listed on amion.

## 2023-12-31 NOTE — Progress Notes (Signed)
 Initial Nutrition Assessment  DOCUMENTATION CODES:  Obesity unspecified  INTERVENTION:  Continue to advance diet as medically appropriate Encourage good PO intake Boost Breeze po TID, each supplement provides 250 kcal and 9 grams of protein Multivitamin w/ minerals daily  NUTRITION DIAGNOSIS:  Inadequate oral intake related to nausea, vomiting as evidenced by  (per notes).  GOAL:  Patient will meet greater than or equal to 90% of their needs  MONITOR:  PO intake, Supplement acceptance, Diet advancement, Labs, Weight trends  REASON FOR ASSESSMENT:  Consult Poor PO  ASSESSMENT:  33 y.o. female presented with intractable nausea and vomiting. Pt recently admitted with recurrent pelvic abscess on 10/13-11/4 and underwent an ex-lap for cyst removal. Pt discharged home with drain in place and wound VAC. PMH includes T2DM, HLD, HTN, and GERD. Pt admitted with intractable nausea and vomiting, AKI and hypokalemia.   11/07 - Admitted  11/08 - diet advanced to dull liquids 11/10 - s/p EGD; diet advanced to clear liquids  RD attempted to see patient, pt busy with other provider.   RD spoke with nurse outside of room and reports that pt has not had any emesis today that she was aware about. Reports patient with ongoing spitting. Pt has also not had anything to eat today, but recently sat her up with her lunch tray to attempt PO.   No meal intakes have been recorded. RD to order patient oral nutrition supplements to assist with PO intake until nausea and vomiting improve. Consider adjusting supplement as diet is advanced to a   Per chart review, pt weight currently down 5.5% since 12/08/23. No additional weights in chart to assess weight loss over the past year.   Nutrition Related Medications: Robinul , NovoLog 0-20 units q4h, Ativan, Reglan , Zofran , Protonix  Drips LR at 50 mL/hr Labs: Sodium 136, Potassium 3.1, BUN 6, Creatinine 1.27, Magnesium  1.8, Hgb A1c 6.3 (10/13)  CBG: 79-95 mg/dL  x 24 hrs   NUTRITION - FOCUSED PHYSICAL EXAM: Deferred.  Diet Order:   Diet Order             Diet clear liquid Room service appropriate? Yes; Fluid consistency: Thin  Diet effective now                  EDUCATION NEEDS:  Not appropriate for education at this time  Skin:  Skin Assessment: Reviewed RN Assessment Wound VAC: midline   Last BM:  Unknown  Height:  Ht Readings from Last 1 Encounters:  12/28/23 5' 4 (1.626 m)   Weight:  Wt Readings from Last 1 Encounters:  12/08/23 136.1 kg   Ideal Body Weight:  54.6 kg  BMI:  Body mass index is 48.68 kg/m.  Estimated Nutritional Needs:  Kcal:  2100-2300 Protein:  105-125 grams Fluid:  >/= 2 L   Nestora Glatter RD, LDN Clinical Dietitian

## 2023-12-31 NOTE — Progress Notes (Signed)
 Referring Provider(s): Dr. Bebe Furry, MD  Supervising Physician: Hughes Simmonds  Patient Status:  [facility] - In-pt  Chief Complaint: Pelvic abscess; s/p pelvic drain placement 10/29 with Dr. Vanice   Subjective:  Patient alert and laying in bed, calm. She appears with flat affect, and speaks softly. Currently without any significant complaints.  Patient denies any fevers, chest pain, SOB, cough, or bleeding.   Allergies: Macrobid  [nitrofurantoin  macrocrystal]  Medications: Prior to Admission medications   Medication Sig Start Date End Date Taking? Authorizing Provider  amLODipine (NORVASC) 5 MG tablet Take 1 tablet (5 mg total) by mouth daily. 12/14/23   Zina Jerilynn LABOR, MD  apixaban (ELIQUIS) 5 MG TABS tablet Take 2 tablets by mouth twice a day for 3 doses then start 1 tablet twice a day thereafter 12/23/23   Arnold, James G, MD  atorvastatin (LIPITOR) 80 MG tablet Take 1 tablet (80 mg total) by mouth daily. 12/14/23   Ozan, Jennifer, DO  chlorthalidone  (HYGROTON ) 25 MG tablet Take 1 tablet (25 mg total) by mouth daily. 12/14/23   Zina Jerilynn LABOR, MD  glycopyrrolate  (ROBINUL ) 1 MG tablet Take 1 tablet (1 mg total) by mouth 3 (three) times daily. 12/14/23   Zina Jerilynn LABOR, MD  losartan (COZAAR) 50 MG tablet Take 1 tablet (50 mg total) by mouth daily. 12/14/23   Zina Jerilynn LABOR, MD  metFORMIN (GLUCOPHAGE) 500 MG tablet Take 1 tablet (500 mg total) by mouth 2 (two) times daily with a meal. 12/23/23   Jayne Vonn DEL, MD  metoCLOPramide  (REGLAN ) 10 MG tablet Take 1 tablet (10 mg total) by mouth every 8 (eight) hours for 14 days. 12/23/23 01/06/24  Jayne Vonn DEL, MD  ondansetron  (ZOFRAN ) 4 MG tablet Take 1 tablet (4 mg total) by mouth every 6 (six) hours as needed for nausea or vomiting. 12/14/23   Zina Jerilynn LABOR, MD  promethazine  (PHENERGAN ) 25 MG tablet Take 1 tablet (25 mg total) by mouth every 6 (six) hours as needed for refractory nausea / vomiting. 12/13/23   Ozan, Jennifer, DO   scopolamine  (TRANSDERM-SCOP) 1 MG/3DAYS Place 1 patch (1 mg total) onto the skin every 3 (three) days. 12/15/23   Zina Jerilynn LABOR, MD  sodium chloride  flush (NS) 0.9 % SOLN Place 10 mLs into feeding tube daily AND flush drain once daily with 5 mL saline from prefilled syringe. 12/23/23 02/21/24  Jayne Vonn DEL, MD     Vital Signs: BP (!) 158/81   Pulse 100   Temp 99.5 F (37.5 C) (Oral)   Resp (!) 22   Ht 5' 4 (1.626 m)   Wt 283 lb 9.6 oz (128.6 kg)   LMP 11/18/2023 (Exact Date)   SpO2 97%   BMI 48.68 kg/m   Physical Exam Constitutional:      Appearance: Normal appearance.  Cardiovascular:     Rate and Rhythm: Normal rate.  Pulmonary:     Effort: Pulmonary effort is normal.  Musculoskeletal:        General: Normal range of motion.  Skin:    General: Skin is warm and dry.         Comments: Right transgluteal drain appropriately dressed. Dressing is clean, dry, intact. Drain incision site minimally tender, without evidence of infection. Retaining suture and Stat Lock in place.  Blood-tinged serous output in collection bulb. Line flushes well.    Neurological:     Mental Status: She is alert and oriented to person, place, and time.  Labs:  CBC: Recent Labs    12/28/23 1235 12/29/23 0428 12/30/23 0425 12/31/23 0446  WBC 7.4 5.5 5.3 5.1  HGB 10.2* 8.9* 9.5* 9.0*  HCT 31.2* 27.1* 28.7* 27.7*  PLT 345 226 307 256    COAGS: Recent Labs    12/05/23 0730 12/08/23 0436 12/15/23 1428 12/16/23 0849 12/17/23 0538 12/17/23 2117 12/18/23 0451  INR 1.2 1.2  --  1.3* 1.2  --   --   APTT  --   --    < > 85* 60* 79* 69*   < > = values in this interval not displayed.    BMP: Recent Labs    12/28/23 1221 12/29/23 0428 12/30/23 0425 12/31/23 0446  NA 136 136 137 136  K 3.2* 3.5 3.2* 3.1*  CL 98 98 99 99  CO2 25 24 22 25   GLUCOSE 93 94 90 84  BUN 9 7 6 6   CALCIUM  8.8* 8.6* 8.6* 8.2*  CREATININE 1.39* 1.45* 1.41* 1.27*  GFRNONAA 51* 49* 50* 57*     LIVER FUNCTION TESTS: Recent Labs    12/13/23 0415 12/21/23 0423 12/26/23 1842 12/27/23 0917  BILITOT 0.5 0.2 1.0 1.0  AST 41 28 36 27  ALT 24 19 21 21   ALKPHOS 48 44 58 54  PROT 6.3* 6.5 7.9 7.3  ALBUMIN 2.3* 2.4* 3.5 3.2*    Assessment and Plan:  Drain Location: right transgluteal pelvic drain  Size: Fr size: 10 Fr Date of placement: 12/17/23  Currently to: Drain collection device: suction bulb 24 hour output:  Output by Drain (mL) 12/29/23 0701 - 12/29/23 1900 12/29/23 1901 - 12/30/23 0700 12/30/23 0701 - 12/30/23 1900 12/30/23 1901 - 12/31/23 0700 12/31/23 0701 - 12/31/23 1558  Closed System Drain 3 Inferior;Right Back Bulb (JP) 10 Fr.  0 15  8    Interval imaging/drain manipulation:   S/p right transgluteal pelvic drain placed 10/29   Current examination: Flushes/aspirates easily.  Insertion site unremarkable. Suture and stat lock in place. Dressed appropriately.   Plan: Continue TID flushes with 5 cc NS. Record output Q shift. Dressing changes QD or PRN if soiled.  Call IR APP or on call IR MD if difficulty flushing or sudden change in drain output.  Repeat imaging/possible drain injection once output < 10 mL/QD (excluding flush material). Consideration for drain removal if output is < 10 mL/QD (excluding flush material), pending discussion with the providing surgical service.  Discharge planning: Please contact IR APP or on call IR MD prior to patient d/c to ensure appropriate follow up plans are in place. Typically patient will follow up with IR clinic 10-14 days post d/c for repeat imaging/possible drain injection. IR scheduler will contact patient with date/time of appointment. Patient will need to flush drain QD with 5 cc NS, record output QD, dressing changes every 2-3 days or earlier if soiled.   IR will continue to follow - please call with questions or concerns.     Thank you for this interesting consult.  I greatly enjoyed meeting BRADY PLANT and look forward to participating in their care.   Electronically Signed: Carlin DELENA Griffon, PA-C 12/31/2023, 2:46 PM     I spent a total of 15 Minutes at the the patient's bedside AND on the patient's hospital floor or unit, greater than 50% of which was counseling/coordinating care for right transgluteal pelvic drain.

## 2023-12-31 NOTE — Progress Notes (Addendum)
 Gynecology Progress Note  Admission Date: 12/26/2023 Current Date: 12/31/2023 12:24 PM  Misty Walsh is a 33 y.o. G1P0101 admitted for acute on chronic n/v.   History complicated by: Patient Active Problem List   Diagnosis Date Noted   Gastric erythema 12/29/2023   Pulmonary embolism (HCC) 12/27/2023   Hypokalemia 12/27/2023   Open wound anterior abdominal wall 12/27/2023   Intractable nausea and vomiting 12/26/2023   Sepsis (HCC) 12/09/2023   AKI (acute kidney injury) 12/09/2023   Acute hypoxic respiratory failure (HCC) 12/09/2023   S/P exploratory laparotomy 12/09/2023   Tubo-ovarian abscess 12/02/2023   Acute pyelonephritis 12/02/2023   BMI 50.0-59.9, adult (HCC) 12/02/2023   Obesity, class 3 (HCC) 12/02/2023   Sepsis due to gram-negative UTI (HCC) 12/01/2023   Metabolic dysfunction-associated steatotic liver disease (MASLD) 12/26/2022   Class 3 severe obesity with serious comorbidity and body mass index (BMI) of 50.0 to 59.9 in adult (HCC) 11/20/2022   Physically inactive 11/20/2022   Cyst of right ovary 06/05/2022   Episodic cluster headache, not intractable 04/09/2022   Hypoventilation associated with obesity syndrome (HCC) 04/09/2022   Sleep-related headache 04/09/2022   Loud snoring 04/09/2022   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/2022   Anxiety and depression 04/09/2022   Adnexal mass 03/20/2022   Type 2 diabetes mellitus without complication, without long-term current use of insulin  (HCC) 02/08/2021   Symptomatic mammary hypertrophy 12/07/2019   Back pain 12/07/2019   Neck pain 12/07/2019   Prediabetes 11/04/2019   Hyperlipemia 11/04/2019   Vitamin D  deficiency 11/04/2019   Primary hypertension 03/22/2019   Hemorrhagic ovarian cyst 06/21/2017   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   Subjective:  Pain stable and pt still feels nauseous and spitting up excess spit. She hasn't tried to take anything PO today. No VB, chest pain, sob, fevers, chills.   Patient states her legs hurt which is why she doesn't want to get up. Patient also noting depression s/s due to clinical course.   Objective:    Current Vital Signs 24h Vital Sign Ranges  T 99.5 F (37.5 C) Temp  Avg: 98.7 F (37.1 C)  Min: 98.4 F (36.9 C)  Max: 99.5 F (37.5 C)  BP (!) 158/81 BP  Min: 135/68  Max: 160/56  HR 100 Pulse  Avg: 102.8  Min: 98  Max: 108  RR (!) 22 Resp  Avg: 22.8  Min: 22  Max: 24  SaO2 97 % Room Air SpO2  Avg: 96.2 %  Min: 94 %  Max: 98 %       24 Hour I/O Current Shift I/O  Time Ins Outs 11/11 0701 - 11/12 0700 In: 5  Out: 15 [Drains:15] 11/12 0701 - 11/12 1900 In: -  Out: 258 [Urine:250; Drains:8]   Patient Vitals for the past 12 hrs:  BP Temp Temp src Pulse Resp SpO2  12/31/23 1052 (!) 158/81 -- -- 100 -- --  12/31/23 0829 (!) 156/73 -- -- 98 -- --  12/31/23 0828 -- 99.5 F (37.5 C) Oral -- -- 97 %  12/31/23 0313 (!) 145/60 -- -- (!) 108 (!) 22 95 %  12/31/23 0106 (!) 147/52 -- -- (!) 107 -- --  12/31/23 0046 (!) 160/56 98.5 F (36.9 C) Oral (!) 105 (!) 22 98 %   Physical exam: General appearance: alert, cooperative, and appears stated age Abdomen: obese, rare bowel sounds, wound vac in place and functioning correctly, nttp Lungs: no respiratory distress Psych: appropriate Neurologic: Grossly normal  Transgluteal drain with sero-sang fluid, approximately 10mL  Medications Current Facility-Administered Medications  Medication Dose Route Frequency Provider Last Rate Last Admin   bisacodyl (DULCOLAX) suppository 10 mg  10 mg Rectal Once PRN  Izell Harari, MD       Chlorhexidine Gluconate Cloth 2 % PADS 6 each  6 each Topical Daily Cleatus Moccasin, MD   6 each at 12/31/23 1100   diphenhydrAMINE  (BENADRYL ) injection 25 mg  25 mg Intravenous Q6H PRN Cleatus Moccasin, MD   25 mg at 12/30/23 1747   enoxaparin (LOVENOX) injection 120 mg  120 mg Subcutaneous Q12H Pyrtle, Gordy HERO, MD   120 mg at 12/31/23 1057   guaiFENesin  (ROBITUSSIN) 100 MG/5ML liquid 15 mL  15 mL Oral Q4H PRN Pratt, Tanya S, MD       hydrALAZINE (APRESOLINE) injection 10 mg  10 mg Intravenous Q6H Cleatus Moccasin, MD   10 mg at 12/31/23 1051   HYDROmorphone  (DILAUDID ) injection 0.2-0.6 mg  0.2-0.6 mg Intravenous Q2H PRN Pratt, Tanya S, MD   0.2 mg at 12/30/23 2154   insulin  aspart (novoLOG) injection 0-20 Units  0-20 Units Subcutaneous Q4H Pratt, Tanya S, MD   3 Units at 12/29/23 9560   lactated ringers  infusion   Intravenous Continuous Ozan, Jennifer, DO 50 mL/hr at 12/31/23 0604 New Bag at 12/31/23 0604   LORazepam (ATIVAN) injection 1 mg  1 mg Intravenous Q6H Cleatus Moccasin, MD   1 mg at 12/31/23 0053   magnesium  sulfate IVPB 2 g 50 mL  2 g Intravenous Once Akula, Vijaya, MD 50 mL/hr at 12/31/23 1148 2 g at 12/31/23 1148   menthol  (CEPACOL) lozenge 3 mg  1 lozenge Oral Q2H PRN Fredirick Glenys RAMAN, MD       metoCLOPramide  (REGLAN ) injection 10 mg  10 mg Intravenous Q6H Pratt, Tanya S, MD   10 mg at 12/31/23 0903   metoprolol tartrate (LOPRESSOR) injection 5 mg  5 mg Intravenous Q5 min PRN Cleatus Moccasin, MD       ondansetron  (ZOFRAN ) injection 4 mg  4 mg Intravenous Q6H Cleatus Moccasin, MD   4 mg at 12/31/23 0547   pantoprazole  (PROTONIX ) injection 40 mg  40 mg Intravenous Q12H Esterwood, Amy S, PA-C   40 mg at 12/31/23 1056   polyethylene glycol (MIRALAX  / GLYCOLAX ) packet 17 g  17 g Oral Daily PRN Pratt, Tanya S, MD       potassium chloride  10 mEq in 100 mL IVPB  10 mEq Intravenous Q1 Hr x 3 Akula, Vijaya, MD       prochlorperazine  (COMPAZINE ) injection 10 mg  10 mg Intravenous Q6H  PRN Pratt, Tanya S, MD   10 mg at 12/30/23 0512   scopolamine  (TRANSDERM-SCOP) 1 MG/3DAYS 1 mg  1 patch Transdermal Q72H Pratt, Tanya S, MD   1 mg at 12/29/23 1732   sodium chloride  flush (NS) 0.9 % injection 10-40 mL  10-40 mL Intracatheter PRN Cleatus Moccasin, MD       Labs  EGD biopsies negative Mg 1.8  Recent Labs  Lab 12/29/23 0428 12/30/23 0425 12/31/23 0446  WBC 5.5 5.3 5.1  HGB 8.9* 9.5*  9.0*  HCT 27.1* 28.7* 27.7*  PLT 226 307 256    Recent Labs  Lab 12/26/23 1842 12/27/23 0917 12/28/23 1221 12/29/23 0428 12/30/23 0425 12/31/23 0446  NA  --  135   < > 136 137 136  K  --  3.4*   < > 3.5 3.2* 3.1*  CL  --  98   < > 98 99 99  CO2  --  26   < > 24 22 25   BUN  --  12   < > 7 6 6   CREATININE  --  1.12*   < > 1.45* 1.41* 1.27*  CALCIUM   --  9.4   < > 8.6* 8.6* 8.2*  PROT 7.9 7.3  --   --   --   --   BILITOT 1.0 1.0  --   --   --   --   ALKPHOS 58 54  --   --   --   --   ALT 21 21  --   --   --   --   AST 36 27  --   --   --   --   GLUCOSE  --  112*   < > 94 90 84   < > = values in this interval not displayed.   Radiology Narrative & Impression  CLINICAL DATA:  Initial evaluation for headache, intractable nausea and vomiting.   EXAM: MR VENOGRAM HEAD WITHOUT AND WITH CONTRAST   TECHNIQUE: Angiographic images of the intracranial venous structures were acquired using MRV technique without and with intravenous contrast.   CONTRAST:  10mL GADAVIST GADOBUTROL 1 MMOL/ML IV SOLN   COMPARISON:  Comparison made with brain MRI performed at the same time.   FINDINGS: Examination degraded by motion artifact.   Normal flow related signal and enhancement seen throughout the superior sagittal sinus to the torcula. Transverse and sigmoid sinuses are patent as are the jugular bulbs and visualized proximal internal jugular veins. Straight sinus, vein of Galen, and internal cerebral veins are patent. No evidence for dural venous sinus thrombosis. No appreciable  abnormality about the cavernous sinus. Superior orbital veins are symmetric and within normal limits. No appreciable cortical vein abnormality.   IMPRESSION: Negative intracranial MRV. No evidence for dural venous sinus thrombosis.     Electronically Signed   By: Morene Hoard M.D.   On: 12/31/2023 00:24    Scan from Monday Narrative & Impression  CLINICAL DATA:  Hysterectomy 12/08/2023. Pelvic abscesses and sepsis. Refractory nausea and vomiting.   EXAM: CT CHEST, ABDOMEN, AND PELVIS WITH CONTRAST   TECHNIQUE: Multidetector CT imaging of the chest, abdomen and pelvis was performed following the standard protocol during bolus administration of intravenous contrast.   RADIATION DOSE REDUCTION: This exam was performed according to the departmental dose-optimization program which includes automated exposure control, adjustment of the mA and/or kV according to patient size and/or use of iterative reconstruction technique.   CONTRAST:  75mL OMNIPAQUE  IOHEXOL  350 MG/ML SOLN   COMPARISON:  CT abdomen pelvis 12/26/2023 and CT chest 12/09/2023.   FINDINGS: CT CHEST FINDINGS   Cardiovascular: Heart is mildly enlarged.  No pericardial effusion.   Mediastinum/Nodes: No pathologically enlarged mediastinal, hilar or axillary lymph nodes. Three soft tissue nodules in a right breast measure up to 1.1 x 1.9 cm medially. Esophagus is grossly unremarkable.   Lungs/Pleura: Mild bibasilar atelectasis. No pleural fluid. Airway is unremarkable.   Musculoskeletal: Degenerative changes in the spine.   CT ABDOMEN PELVIS  FINDINGS   Hepatobiliary: Liver is decreased in attenuation diffusely and is enlarged, 19.2 cm. Vicarious excretion of contrast in the gallbladder. No biliary ductal dilatation.   Pancreas: Negative.   Spleen: Negative.   Adrenals/Urinary Tract: Adrenal glands are unremarkable. Persistent moderate right hydronephrosis with decreased attenuation of the right  renal parenchyma and no excretion of contrast on delayed imaging. No obstructing stone. Left kidney is unremarkable. Left ureter is decompressed. Bladder is grossly unremarkable.   Stomach/Bowel: Stomach, small bowel, appendix and colon are unremarkable.   Vascular/Lymphatic: Vascular structures are unremarkable. No pathologically enlarged lymph nodes.   Reproductive: Hysterectomy. Percutaneous drain in the cul-de-sac region with a residual 4.7 x 8.0 cm heterogeneous fluid collection, grossly similar to 12/26/2023. Separate rounded low-attenuation lesion along the superior margin of the abscess measures 2.5 cm (3/98), unchanged and which may represent the right ovary. Postsurgical inflammatory stranding in the ventral pelvis. Collection of fluid and air involving the inferior right rectus abdominus measures 3.3 x 4.3 cm (3/88), slightly decreased in size from 4.0 x 5.8 cm on 12/26/2023. A second subcutaneous fluid collection is seen deep to the umbilicus, measuring 4.7 x 5.3 cm (3/97), stable to minimally enlarged from 4.5 x 4.9 cm on 12/26/2023.   Other: Mild presacral edema.  Small pelvic free fluid.  No free air.   Musculoskeletal: None.   IMPRESSION: 1. Similar right adnexal abscess with percutaneous drain in place. 2. Subcutaneous collection of fluid and air involving the right rectus abdominus muscle as has decreased slightly in size in the interval and is worrisome for an abscess. 3. Retro umbilical fluid collection is stable to minimally enlarged and may represent a seroma. Difficult to exclude abscess. 4. Persistent moderate right hydronephrosis with decreased renal function, likely related to the aforementioned pelvic abscess. 5. Steatotic enlarged liver.     Electronically Signed   By: Newell Eke M.D.   On: 12/31/2023 09:49    Assessment & Plan:  Patient stable *GYN: not on abx anymore for TOA/pelvic abscesses. Smaller but still present abscesses on admit  CT. I called Radiology to read her scan from Monday and stable vs admit CT.  Continue with IR drain for now.  WOC consulted and [place] changed on 11/10 and plan to change in 1 week (11/17 Hospitalist team following along for below co-morbidities. *GI: no clear etiology, aside from healing from abdominal/pelvic abscesses, which I d/w her, as well as long clinical course that will hopefully lead to resolution of the abscesses. GI has signed off and will d/w Hospitalist team re: PO robinul  (ordered) and IV haldol for the nausea; it sounds like she felt better after ativan yesterday so hopeful can do haldol and get some effect from it. Nutrition also consulted; pt states she did okay with supplement drinks in the prior admit. Pt currently on clears and I encouraged to try this today -s/p 11/10 EGD this morning with negative findings and final path biopsies.   *HTN: see above *DM2: see above.  *Pain: controlled with IV meds *FEN/GI: clears ordered, on MIVF, replete electrolytes PRN *Heme: continue lovenox until able to take PO. Pharmacy consult ordered due to BMI *PT: consulted *AKI: stable  Code Status: Full Code  Total time taking care of the patient was 35 minutes, with greater than 50% of the time spent in face to face interaction with the patient.  Bebe Izell Raddle MD Attending Center for [facility]) GYN Consult Phone: [phone] (M-F, 0800-1700) & [phone] (Off hours, weekends, holidays)

## 2023-12-31 NOTE — Progress Notes (Signed)
 GYN Note 12/31/2023 Time: 0957 Patient sleeping. Will come back later to formally round  Bebe Izell Raddle MD Attending Center for [facility]) GYN Consult Phone: [phone] (M-F, 0800-1700) & [phone] (Off hours, weekends, holidays)

## 2024-01-01 ENCOUNTER — Inpatient Hospital Stay (HOSPITAL_COMMUNITY): Payer: MEDICAID

## 2024-01-01 DIAGNOSIS — N7093 Salpingitis and oophoritis, unspecified: Secondary | ICD-10-CM

## 2024-01-01 HISTORY — PX: IR US GUIDE BX ASP/DRAIN: IMG2392

## 2024-01-01 LAB — GLUCOSE, CAPILLARY
Glucose-Capillary: 88 mg/dL (ref 70–99)
Glucose-Capillary: 92 mg/dL (ref 70–99)
Glucose-Capillary: 92 mg/dL (ref 70–99)
Glucose-Capillary: 93 mg/dL (ref 70–99)
Glucose-Capillary: 94 mg/dL (ref 70–99)
Glucose-Capillary: 94 mg/dL (ref 70–99)

## 2024-01-01 LAB — BASIC METABOLIC PANEL WITH GFR
Anion gap: 10 (ref 5–15)
BUN: 5 mg/dL — ABNORMAL LOW (ref 6–20)
CO2: 25 mmol/L (ref 22–32)
Calcium: 8.2 mg/dL — ABNORMAL LOW (ref 8.9–10.3)
Chloride: 99 mmol/L (ref 98–111)
Creatinine, Ser: 1.27 mg/dL — ABNORMAL HIGH (ref 0.44–1.00)
GFR, Estimated: 57 mL/min — ABNORMAL LOW (ref >60)
Glucose, Bld: 91 mg/dL (ref 70–99)
Potassium: 3.2 mmol/L — ABNORMAL LOW (ref 3.5–5.1)
Sodium: 134 mmol/L — ABNORMAL LOW (ref 135–145)

## 2024-01-01 LAB — CBC WITH DIFFERENTIAL/PLATELET
Abs Immature Granulocytes: 0.02 K/uL (ref 0.00–0.07)
Basophils Absolute: 0 K/uL (ref 0.0–0.1)
Basophils Relative: 1 %
Eosinophils Absolute: 0.2 K/uL (ref 0.0–0.5)
Eosinophils Relative: 5 %
HCT: 28 % — ABNORMAL LOW (ref 36.0–46.0)
Hemoglobin: 9.1 g/dL — ABNORMAL LOW (ref 12.0–15.0)
Immature Granulocytes: 0 %
Lymphocytes Relative: 28 %
Lymphs Abs: 1.4 K/uL (ref 0.7–4.0)
MCH: 31.4 pg (ref 26.0–34.0)
MCHC: 32.5 g/dL (ref 30.0–36.0)
MCV: 96.6 fL (ref 80.0–100.0)
Monocytes Absolute: 0.7 K/uL (ref 0.1–1.0)
Monocytes Relative: 15 %
Neutro Abs: 2.5 K/uL (ref 1.7–7.7)
Neutrophils Relative %: 51 %
Platelets: 253 K/uL (ref 150–400)
RBC: 2.9 MIL/uL — ABNORMAL LOW (ref 3.87–5.11)
RDW: 14.1 % (ref 11.5–15.5)
WBC: 4.9 K/uL (ref 4.0–10.5)
nRBC: 0 % (ref 0.0–0.2)

## 2024-01-01 LAB — MAGNESIUM: Magnesium: 2.1 mg/dL (ref 1.7–2.4)

## 2024-01-01 MED ORDER — LIDOCAINE-EPINEPHRINE 1 %-1:100000 IJ SOLN
INTRAMUSCULAR | Status: AC
  Filled 2024-01-01: qty 1

## 2024-01-01 MED ORDER — POTASSIUM CHLORIDE CRYS ER 20 MEQ PO TBCR: 40.0000 meq | EXTENDED_RELEASE_TABLET | Freq: Two times a day (BID) | ORAL | Status: DC

## 2024-01-01 MED ORDER — LACTATED RINGERS IV SOLN: INTRAVENOUS | Status: AC

## 2024-01-01 MED ORDER — LIDOCAINE-EPINEPHRINE (PF) 1 %-1:200000 IJ SOLN
20.0000 mL | Freq: Once | INTRAMUSCULAR | Status: DC
Start: 2024-01-01 — End: 2024-01-08
  Filled 2024-01-01: qty 20

## 2024-01-01 MED ORDER — INSULIN ASPART 100 UNIT/ML IJ SOLN: 0.0000 [IU] | Freq: Three times a day (TID) | INTRAMUSCULAR | Status: DC

## 2024-01-01 MED ORDER — POTASSIUM CHLORIDE 10 MEQ/100ML IV SOLN
10.0000 meq | INTRAVENOUS | Status: AC
  Administered 2024-01-01 (×4): 10 meq via INTRAVENOUS
  Filled 2024-01-01 (×4): qty 100

## 2024-01-01 MED ORDER — POTASSIUM CHLORIDE 20 MEQ PO PACK: 40.0000 meq | PACK | Freq: Two times a day (BID) | ORAL | Status: DC

## 2024-01-01 MED ORDER — INSULIN ASPART 100 UNIT/ML IJ SOLN: 0.0000 [IU] | Freq: Every day | INTRAMUSCULAR | Status: DC

## 2024-01-01 MED ORDER — HALOPERIDOL LACTATE 5 MG/ML IJ SOLN
1.0000 mg | Freq: Four times a day (QID) | INTRAMUSCULAR | Status: DC | PRN
  Administered 2024-01-01: 1 mg via INTRAVENOUS
  Filled 2024-01-01 (×2): qty 0.2

## 2024-01-01 NOTE — Progress Notes (Signed)
 Gynecology Progress Note  Admission Date: 12/26/2023 Current Date: 01/01/2024 10:18 AM  Misty Walsh is a 33 y.o. G1P0101 admitted for acute on chronic nausea and vomiting.  History complicated by: Patient Active Problem List   Diagnosis Date Noted   Gastric erythema 12/29/2023   Pulmonary embolism (HCC) 12/27/2023   Hypokalemia 12/27/2023   Open wound anterior abdominal wall 12/27/2023   Intractable nausea and vomiting 12/26/2023   Sepsis (HCC) 12/09/2023   AKI (acute kidney injury) 12/09/2023   Acute hypoxic respiratory failure (HCC) 12/09/2023   S/P exploratory laparotomy 12/09/2023   Tubo-ovarian abscess 12/02/2023   Acute pyelonephritis 12/02/2023   BMI 50.0-59.9, adult (HCC) 12/02/2023   Obesity, class 3 (HCC) 12/02/2023   Sepsis due to gram-negative UTI (HCC) 12/01/2023   Metabolic dysfunction-associated steatotic liver disease (MASLD) 12/26/2022   Class 3 severe obesity with serious comorbidity and body mass index (BMI) of 50.0 to 59.9 in adult (HCC) 11/20/2022   Physically inactive 11/20/2022   Cyst of right ovary 06/05/2022   Episodic cluster headache, not intractable 04/09/2022   Hypoventilation associated with obesity syndrome (HCC) 04/09/2022   Sleep-related headache 04/09/2022   Loud snoring 04/09/2022   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/2022   Anxiety and depression 04/09/2022   Adnexal mass 03/20/2022   Type 2 diabetes mellitus without complication, without long-term current use of insulin  (HCC) 02/08/2021   Symptomatic mammary hypertrophy 12/07/2019   Back pain 12/07/2019   Neck pain 12/07/2019   Prediabetes 11/04/2019   Hyperlipemia 11/04/2019   Vitamin D  deficiency 11/04/2019   Primary hypertension 03/22/2019   Hemorrhagic ovarian cyst 06/21/2017   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   Cellulitis 01/06/2013   History of cesarean section, classical 12/18/2012   Allergy 10/29/2012   ROS and patient/family/surgical history,  located on admission H&P note dated 12/26/2023, have been reviewed, and there are no changes except as noted below  Yesterday/Overnight Events:  Nursing states that is still nott wanting to get out of bed, still urinating and having bowel movements in the bed.   Subjective:  Patient reports continued nausea but was able to tolerate sips of lemonade, and a little broth yesterday. No VB, chest pain, sob, fevers, chills.   Patient states her abdomen hurts and it is hard to lower the safety rail to be able to get out of bed, so she calls for help but by the time someone comes, she has an accident in bed.     Objective:    Current Vital Signs 24h Vital Sign Ranges  T 98.5 F (36.9 C) Temp  Avg: 98.5 F (36.9 C)  Min: 98.1 F (36.7 C)  Max: 98.8 F (37.1 C)  BP 135/80 BP  Min: 135/80  Max: 167/94  HR 100 Pulse  Avg: 98.1  Min: 92  Max: 102  RR 19 Resp  Avg: 18.2  Min: 17  Max: 19  SaO2 94 % Room Air SpO2  Avg: 95.7 %  Min: 93 %  Max: 100 %       24 Hour I/O Current Shift I/O  Time Ins Outs 11/12 0701 - 11/13 0700 In: 5  Out: 458 [Urine:450; Drains:8] No intake/output data recorded.   Patient Vitals for the past 12 hrs:  BP Temp Temp src Pulse Resp SpO2  01/01/24 0822 135/80 98.5 F (36.9 C) Oral 100 19 94 %  01/01/24 0354 (!) 142/76 -- -- 98 -- --  01/01/24 0353 -- 98.5 F (36.9 C) Oral --  18 93 %  01/01/24 0125 (!) 148/83 98.8 F (37.1 C) Oral 99 17 96 %  01/01/24 0051 (!) 167/94 -- -- (!) 102 18 100 %  01/01/24 0050 -- 98.8 F (37.1 C) Oral -- -- --  12/31/23 2238 (!) 140/77 98.1 F (36.7 C) Oral 96 19 94 %   Physical exam: General appearance: alert and appears stated age Abdomen: patient declined examination of her belly Lungs: no respiratory distress Psych: depressed and withdrawn Neurologic: Grossly normal  Transgluteal drain with small amount sero-sanguinous fluid  Medications Current Facility-Administered Medications  Medication Dose Route Frequency Provider  Last Rate Last Admin   bisacodyl (DULCOLAX) suppository 10 mg  10 mg Rectal Once PRN Izell Harari, MD       Chlorhexidine Gluconate Cloth 2 % PADS 6 each  6 each Topical Daily Cleatus Moccasin, MD   6 each at 12/31/23 1100   diphenhydrAMINE  (BENADRYL ) injection 25 mg  25 mg Intravenous Q6H PRN Cleatus Moccasin, MD   25 mg at 12/30/23 1747   enoxaparin (LOVENOX) injection 120 mg  120 mg Subcutaneous Q12H Pyrtle, Gordy HERO, MD   120 mg at 12/31/23 2312   feeding supplement (BOOST / RESOURCE BREEZE) liquid 1 Container  1 Container Oral TID BM Izell Harari, MD       glycopyrrolate  (ROBINUL ) tablet 1 mg  1 mg Oral BID Pickens, Charlie, MD   1 mg at 12/31/23 1540   guaiFENesin  (ROBITUSSIN) 100 MG/5ML liquid 15 mL  15 mL Oral Q4H PRN Pratt, Tanya S, MD       hydrALAZINE (APRESOLINE) injection 10 mg  10 mg Intravenous Q6H Cleatus Moccasin, MD   10 mg at 01/01/24 0552   HYDROmorphone  (DILAUDID ) injection 0.2-0.6 mg  0.2-0.6 mg Intravenous Q2H PRN Pratt, Tanya S, MD   0.6 mg at 12/31/23 2314   insulin  aspart (novoLOG) injection 0-20 Units  0-20 Units Subcutaneous Q4H Fredirick Glenys RAMAN, MD   3 Units at 12/29/23 9560   lactated ringers  infusion   Intravenous Continuous Erik Kieth BROCKS, MD 40 mL/hr at 01/01/24 0843 New Bag at 01/01/24 0843   lidocaine-EPINEPHrine  (PF) (XYLOCAINE-EPINEPHrine ) 1 %-1:200000 (PF) injection 20 mL  20 mL Infiltration Once Mugweru, Jon, MD       LORazepam (ATIVAN) injection 1 mg  1 mg Intravenous Q6H Cleatus Moccasin, MD   1 mg at 01/01/24 0844   menthol  (CEPACOL) lozenge 3 mg  1 lozenge Oral Q2H PRN Fredirick Glenys RAMAN, MD       metoCLOPramide  (REGLAN ) injection 10 mg  10 mg Intravenous Q6H Pratt, Tanya S, MD   10 mg at 01/01/24 0844   metoprolol tartrate (LOPRESSOR) injection 5 mg  5 mg Intravenous Q5 min PRN Cleatus Moccasin, MD       multivitamin with minerals tablet 1 tablet  1 tablet Oral Daily Izell Harari, MD       ondansetron  (ZOFRAN ) injection 4 mg  4 mg Intravenous Q6H Cleatus Moccasin,  MD   4 mg at 01/01/24 9446   pantoprazole  (PROTONIX ) injection 40 mg  40 mg Intravenous Q12H Esterwood, Amy S, PA-C   40 mg at 12/31/23 2248   polyethylene glycol (MIRALAX  / GLYCOLAX ) packet 17 g  17 g Oral Daily PRN Pratt, Tanya S, MD       potassium chloride  (KLOR-CON ) packet 40 mEq  40 mEq Oral BID Bassheva Flury A, MD       prochlorperazine  (COMPAZINE ) injection 10 mg  10 mg Intravenous Q6H PRN Fredirick Glenys RAMAN, MD  10 mg at 12/30/23 0512   scopolamine  (TRANSDERM-SCOP) 1 MG/3DAYS 1 mg  1 patch Transdermal Q72H Fredirick Glenys RAMAN, MD   1 mg at 12/29/23 1732   sodium chloride  flush (NS) 0.9 % injection 10-40 mL  10-40 mL Intracatheter PRN Cleatus Moccasin, MD   10 mL at 12/31/23 1333   Labs  EGD biopsies negative Mg 2.1  Recent Labs  Lab 12/30/23 0425 12/31/23 0446 01/01/24 0513  WBC 5.3 5.1 4.9  HGB 9.5* 9.0* 9.1*  HCT 28.7* 27.7* 28.0*  PLT 307 256 253    Recent Labs  Lab 12/26/23 1842 12/27/23 0917 12/28/23 1221 12/30/23 0425 12/31/23 0446 01/01/24 0513  NA  --  135   < > 137 136 134*  K  --  3.4*   < > 3.2* 3.1* 3.2*  CL  --  98   < > 99 99 99  CO2  --  26   < > 22 25 25   BUN  --  12   < > 6 6 5*  CREATININE  --  1.12*   < > 1.41* 1.27* 1.27*  CALCIUM   --  9.4   < > 8.6* 8.2* 8.2*  PROT 7.9 7.3  --   --   --   --   BILITOT 1.0 1.0  --   --   --   --   ALKPHOS 58 54  --   --   --   --   ALT 21 21  --   --   --   --   AST 36 27  --   --   --   --   GLUCOSE  --  112*   < > 90 84 91   < > = values in this interval not displayed.    Radiology Narrative & Impression  CLINICAL DATA:  Initial evaluation for headache, intractable nausea and vomiting.   EXAM: MR VENOGRAM HEAD WITHOUT AND WITH CONTRAST   TECHNIQUE: Angiographic images of the intracranial venous structures were acquired using MRV technique without and with intravenous contrast.   CONTRAST:  10mL GADAVIST GADOBUTROL 1 MMOL/ML IV SOLN   COMPARISON:  Comparison made with brain MRI performed at the  same time.   FINDINGS: Examination degraded by motion artifact.   Normal flow related signal and enhancement seen throughout the superior sagittal sinus to the torcula. Transverse and sigmoid sinuses are patent as are the jugular bulbs and visualized proximal internal jugular veins. Straight sinus, vein of Galen, and internal cerebral veins are patent. No evidence for dural venous sinus thrombosis. No appreciable abnormality about the cavernous sinus. Superior orbital veins are symmetric and within normal limits. No appreciable cortical vein abnormality.   IMPRESSION: Negative intracranial MRV. No evidence for dural venous sinus thrombosis.     Electronically Signed   By: Morene Hoard M.D.   On: 12/31/2023 00:24    Scan from Monday Narrative & Impression  CLINICAL DATA:  Hysterectomy 12/08/2023. Pelvic abscesses and sepsis. Refractory nausea and vomiting.   EXAM: CT CHEST, ABDOMEN, AND PELVIS WITH CONTRAST   TECHNIQUE: Multidetector CT imaging of the chest, abdomen and pelvis was performed following the standard protocol during bolus administration of intravenous contrast.   RADIATION DOSE REDUCTION: This exam was performed according to the departmental dose-optimization program which includes automated exposure control, adjustment of the mA and/or kV according to patient size and/or use of iterative reconstruction technique.   CONTRAST:  75mL OMNIPAQUE  IOHEXOL  350 MG/ML SOLN  COMPARISON:  CT abdomen pelvis 12/26/2023 and CT chest 12/09/2023.   FINDINGS: CT CHEST FINDINGS   Cardiovascular: Heart is mildly enlarged.  No pericardial effusion.   Mediastinum/Nodes: No pathologically enlarged mediastinal, hilar or axillary lymph nodes. Three soft tissue nodules in a right breast measure up to 1.1 x 1.9 cm medially. Esophagus is grossly unremarkable.   Lungs/Pleura: Mild bibasilar atelectasis. No pleural fluid. Airway is unremarkable.   Musculoskeletal:  Degenerative changes in the spine.   CT ABDOMEN PELVIS FINDINGS   Hepatobiliary: Liver is decreased in attenuation diffusely and is enlarged, 19.2 cm. Vicarious excretion of contrast in the gallbladder. No biliary ductal dilatation.   Pancreas: Negative.   Spleen: Negative.   Adrenals/Urinary Tract: Adrenal glands are unremarkable. Persistent moderate right hydronephrosis with decreased attenuation of the right renal parenchyma and no excretion of contrast on delayed imaging. No obstructing stone. Left kidney is unremarkable. Left ureter is decompressed. Bladder is grossly unremarkable.   Stomach/Bowel: Stomach, small bowel, appendix and colon are unremarkable.   Vascular/Lymphatic: Vascular structures are unremarkable. No pathologically enlarged lymph nodes.   Reproductive: Hysterectomy. Percutaneous drain in the cul-de-sac region with a residual 4.7 x 8.0 cm heterogeneous fluid collection, grossly similar to 12/26/2023. Separate rounded low-attenuation lesion along the superior margin of the abscess measures 2.5 cm (3/98), unchanged and which may represent the right ovary. Postsurgical inflammatory stranding in the ventral pelvis. Collection of fluid and air involving the inferior right rectus abdominus measures 3.3 x 4.3 cm (3/88), slightly decreased in size from 4.0 x 5.8 cm on 12/26/2023. A second subcutaneous fluid collection is seen deep to the umbilicus, measuring 4.7 x 5.3 cm (3/97), stable to minimally enlarged from 4.5 x 4.9 cm on 12/26/2023.   Other: Mild presacral edema.  Small pelvic free fluid.  No free air.   Musculoskeletal: None.   IMPRESSION: 1. Similar right adnexal abscess with percutaneous drain in place. 2. Subcutaneous collection of fluid and air involving the right rectus abdominus muscle as has decreased slightly in size in the interval and is worrisome for an abscess. 3. Retro umbilical fluid collection is stable to minimally enlarged and may  represent a seroma. Difficult to exclude abscess. 4. Persistent moderate right hydronephrosis with decreased renal function, likely related to the aforementioned pelvic abscess. 5. Steatotic enlarged liver.     Electronically Signed   By: Newell Eke M.D.   On: 12/31/2023 09:49    Assessment & Plan:  Patient stable *GYN: not on abx anymore for TOA/pelvic abscesses. Smaller but still present abscesses on admit CT.  Patient will have another IR drain placed today, and went for the procedure immediately after our encounter.  WOC has been consulted and [place] changed on 11/10 and plan to change in 1 week (11/17).  Hospitalist team following along for below co-morbidities. *GI: Still no clear etiology, aside from healing from abdominal/pelvic abscesses, hoping draining the residual abscess today will make her feel better. GI has signed off and will d/w Hospitalist team re: PO robinul  (ordered) and IV haldol for the nausea; it sounds like she felt better after ativan and haldol yesterday ; these were ordered prn. Nutrition was also consulted;  she currently on clears and patient was encouraged to continue to advance diet as tolerated. -s/p 11/10 EGD this morning with negative findings and final path biopsies.   *HTN: Continue medications, and co-managed with TRH *DM2: Continue medications, and co-managed with TRH *Pain: controlled with IV meds *FEN/GI: clears ordered, on MIVF, replete electrolytes PRN *  Heme: continue lovenox until able to take PO.  *PT: consulted to help with ambulation.  Bedside commode to help patient. *AKI: stable *Psych:  Transitions of care ordered for patient for evaluation, patient was asked about starting a SSRI and she did not respond when asked if I could order this.  Will follow up TOC recommendations, may need Psych referral if needed.  Code Status: Full Code  Total time taking care of the patient was 35 minutes, with greater than 50% of the time spent in face to  face interaction with the patient.  Gloris Hugger, MD Attending, [facility]) GYN Consult Phone: [phone] (M-F, 0800-1700) & [phone] (Off hours, weekends, holidays)

## 2024-01-01 NOTE — Progress Notes (Signed)
 [facility]                                                                               [name], is a 33 y.o. female, DOB - 1990-03-28, FMW:[id number] Admit date - 12/26/2023    Outpatient Primary MD for the patient is Buck Search, PA-C  LOS - 6  days    Brief summary    76F h/o HTN, HLD, DM2, PE and recent complicated course following pelvic abscesses and sepsis, status post exploratory laparotomy, multiple intra-abdominal fluid collections, some partially drained with refractory nausea vomiting c/b hypokalemia.  [facility] asked to consult on this patient.   CT chest, abdomen and pelvis showing Similar right adnexal abscess with percutaneous drain in place. Subcutaneous collection of fluid and air involving the right rectus abdominus muscle as has decreased slightly in size in the interval and is worrisome for an abscess.  Retro umbilical fluid collection is stable to minimally enlarged and may represent a seroma. Difficult to exclude abscess. Persistent moderate right hydronephrosis with decreased renal function, likely related to the aforementioned pelvic abscess.  Patient seen and examined, denies any new complaints. Reports feeling sleepy from pain meds. She continues to have nausea, but no vomiting.  Assessment & Plan   Intractable nausea and vomiting leading to persistent hypokalemia S/p EGD by gastroenterology.  Showing erythematous mucosa in the stomach likely reactive and related to vomiting and retching. Persistent nausea and vomiting probably from persistent abscesses seen on the repeat CT abdomen and pelvis. Patient is currently on Robinul  1 mg twice daily,  lorazepam 1 mg every 6 hours,  Reglan  10 mg every 6 hours,  Zofran  4 mg every 6 hours and prochlorperazine  10 mg every 6 hours as needed Scopolamine  patch 1 mg. In addition to the above, she was started on IV haldol.  She is also on Protonix  IV 40 mg twice daily Currently on clear  liquid diet, tolerating.    Abdominal wall abscess IR consulted for drain and fluid to be sent for analysis.  ID consulted for antibiotics in view  of persistent abscesses.     Pelvic abscesses s/p exp laparotomy  Right transgluteal drain on 10/29    Hypertension Blood pressure parameters are optimal.     Type 2 diabetes mellitus CBG (last 3)  Recent Labs    01/01/24 0048 01/01/24 0351 01/01/24 0823  GLUCAP 94 94 93   A1c 6.3   History of pulmonary embolism  on Lovenox injections as patient is not able to tolerate meds orally   AKI likely secondary to poor oral intake and patient was also found to have right moderate hydronephrosis from the pelvic abscesses Creatinine slowly improving.  Currently it is at 1.2 and stable.    Hypokalemia and hypomagnesemia Replaced, repeat in the morning.    Normocytic anemia/anemia of acute illness/anemia of blood loss Baseline hemoglobin around 12, dropped to 10.4 to 9. Anemia panel reviewed  Transfuse to keep it greater than 7    Medications  Scheduled Meds:  Chlorhexidine Gluconate Cloth  6 each Topical Daily   enoxaparin (LOVENOX) injection  120 mg Subcutaneous Q12H   feeding supplement  1 Container  Oral TID BM   glycopyrrolate   1 mg Oral BID   hydrALAZINE  10 mg Intravenous Q6H   insulin  aspart  0-20 Units Subcutaneous Q4H   lidocaine-EPINEPHrine  (PF)  20 mL Infiltration Once   LORazepam  1 mg Intravenous Q6H   metoCLOPramide  (REGLAN ) injection  10 mg Intravenous Q6H   multivitamin with minerals  1 tablet Oral Daily   ondansetron  (ZOFRAN ) IV  4 mg Intravenous Q6H   pantoprazole  (PROTONIX ) IV  40 mg Intravenous Q12H   potassium chloride   40 mEq Oral BID   scopolamine   1 patch Transdermal Q72H   Continuous Infusions:  lactated ringers  40 mL/hr at 01/01/24 0843   PRN Meds:.bisacodyl, diphenhydrAMINE , guaiFENesin , HYDROmorphone  (DILAUDID ) injection, menthol , metoprolol tartrate, polyethylene glycol,  prochlorperazine , sodium chloride  flush    Subjective:   Misty Walsh was seen and examined today.  Just waking up , sleepy, has abdominal pain. Still nauseated, slowly improving.   Objective:   Vitals:   01/01/24 0125 01/01/24 0353 01/01/24 0354 01/01/24 0822  BP: (!) 148/83  (!) 142/76 135/80  Pulse: 99  98 100  Resp: 17 18  19   Temp: 98.8 F (37.1 C) 98.5 F (36.9 C)  98.5 F (36.9 C)  TempSrc: Oral Oral  Oral  SpO2: 96% 93%  94%  Height:        Intake/Output Summary (Last 24 hours) at 01/01/2024 1014 Last data filed at 12/31/2023 1800 Gross per 24 hour  Intake 5 ml  Output 458 ml  Net -453 ml   Filed Weights     Exam  General exam: ill appearing woman, not in distress.  Respiratory system: Clear to auscultation. Respiratory effort normal. Cardiovascular system: S1 & S2 heard, RRR.  Gastrointestinal system: Abdomen is soft, JP drain in place.  Central nervous system: Alert and oriented.  Extremities: puffiness of hands and feet Skin: No rashes,  Psychiatry: flat affect.    Data Reviewed:  I have personally reviewed following labs and imaging studies   CBC Lab Results  Component Value Date   WBC 4.9 01/01/2024   RBC 2.90 (L) 01/01/2024   HGB 9.1 (L) 01/01/2024   HCT 28.0 (L) 01/01/2024   MCV 96.6 01/01/2024   MCH 31.4 01/01/2024   PLT 253 01/01/2024   MCHC 32.5 01/01/2024   RDW 14.1 01/01/2024   LYMPHSABS 1.4 01/01/2024   MONOABS 0.7 01/01/2024   EOSABS 0.2 01/01/2024   BASOSABS 0.0 01/01/2024     Last metabolic panel Lab Results  Component Value Date   NA 134 (L) 01/01/2024   K 3.2 (L) 01/01/2024   CL 99 01/01/2024   CO2 25 01/01/2024   BUN 5 (L) 01/01/2024   CREATININE 1.27 (H) 01/01/2024   GLUCOSE 91 01/01/2024   GFRNONAA 57 (L) 01/01/2024   GFRAA 127 11/03/2019   CALCIUM  8.2 (L) 01/01/2024   PHOS 2.7 12/10/2023   PROT 7.3 12/27/2023   ALBUMIN 3.2 (L) 12/27/2023   LABGLOB 2.7 11/20/2022   AGRATIO 2.0 05/25/2020   BILITOT  1.0 12/27/2023   ALKPHOS 54 12/27/2023   AST 27 12/27/2023   ALT 21 12/27/2023   ANIONGAP 10 01/01/2024    CBG (last 3)  Recent Labs    01/01/24 0048 01/01/24 0351 01/01/24 0823  GLUCAP 94 94 93      Coagulation Profile: No results for input(s): INR, PROTIME in the last 168 hours.   Radiology Studies: MR MRV HEAD W WO CONTRAST Result Date: 12/31/2023 CLINICAL DATA:  Initial evaluation for headache,  intractable nausea and vomiting. EXAM: MR VENOGRAM HEAD WITHOUT AND WITH CONTRAST TECHNIQUE: Angiographic images of the intracranial venous structures were acquired using MRV technique without and with intravenous contrast. CONTRAST:  10mL GADAVIST GADOBUTROL 1 MMOL/ML IV SOLN COMPARISON:  Comparison made with brain MRI performed at the same time. FINDINGS: Examination degraded by motion artifact. Normal flow related signal and enhancement seen throughout the superior sagittal sinus to the torcula. Transverse and sigmoid sinuses are patent as are the jugular bulbs and visualized proximal internal jugular veins. Straight sinus, vein of Galen, and internal cerebral veins are patent. No evidence for dural venous sinus thrombosis. No appreciable abnormality about the cavernous sinus. Superior orbital veins are symmetric and within normal limits. No appreciable cortical vein abnormality. IMPRESSION: Negative intracranial MRV. No evidence for dural venous sinus thrombosis. Electronically Signed   By: Morene Hoard M.D.   On: 12/31/2023 00:24   MR BRAIN W WO CONTRAST Result Date: 12/31/2023 CLINICAL DATA:  Initial evaluation for headache, intractable nausea and vomiting. EXAM: MRI HEAD WITHOUT AND WITH CONTRAST TECHNIQUE: Multiplanar, multiecho pulse sequences of the brain and surrounding structures were obtained without and with intravenous contrast. CONTRAST:  10mL GADAVIST GADOBUTROL 1 MMOL/ML IV SOLN COMPARISON:  None Available. FINDINGS: Brain: Examination degraded by motion artifact.  Cerebral volume within normal limits. Few subcentimeter FLAIR hyperintensity seen involving the periventricular white matter, nonspecific, but overall minimal in nature. No evidence for acute or subacute infarct. No areas of chronic cortical infarction. No acute or chronic intracranial blood products. No mass lesion, midline shift or mass effect. No hydrocephalus or extra-axial fluid collection. Pituitary gland within normal limits. No abnormal enhancement. Vascular: Major intracranial vascular flow voids are maintained. 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 Craniocervical junction otherwise unremarkable. Decreased T1 signal intensity within the visualized bone marrow, nonspecific, but most commonly related to anemia, smoking, or obesity. No scalp soft tissue abnormality. Sinuses/Orbits: Globes and orbital soft tissues within normal limits. Mild scattered mucosal thickening present about the sphenoid ethmoidal sinuses. Paranasal sinuses are otherwise clear. No mastoid effusion. Other: None. IMPRESSION: 1. No acute intracranial abnormality. 2. 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 3. Otherwise essentially normal and unremarkable brain MRI. Electronically Signed   By: Morene Hoard M.D.   On: 12/31/2023 00:18   US  EKG SITE RITE Result Date: 12/30/2023 If Site Rite image not attached, placement could not be confirmed due to current cardiac rhythm.      Elgie Butter M.D. [facility] 01/01/2024, 10:14 AM  Available via Epic secure chat 7am-7pm After 7 pm, please refer to night coverage provider listed on amion.

## 2024-01-01 NOTE — TOC Initial Note (Signed)
 Transition of Care [facility]) - Initial/Assessment Note    Patient Details  Name: Misty Walsh MRN: [id number] Date of Birth: 1990-12-25  Transition of [facility]) CM/SW Contact:    Charlene Julian Daring, RN Phone Number:[phone] 01/01/2024, 11:14 AM  Clinical Narrative:                  CM notified TOC pharmacy here at [facility] to apply MATCH if medications are needed for dc .  Patient does not have insurance and does not have income for working at this time.   Expected Discharge Plan and Services  Dc home Resume DME services for wound vac with [employer] Randine Pope) weekly dressing chagnes at [facility] on 3 rd- appointment will need to be made when dc date is known.  Prior Living Arrangements/Services Lives with 17 year old son  Activities of Daily Living   ADL Screening (condition at time of admission) Independently performs ADLs?: Yes (appropriate for developmental age) Is the patient deaf or have difficulty hearing?: No Does the patient have difficulty seeing, even when wearing glasses/contacts?: No Does the patient have difficulty concentrating, remembering, or making decisions?: No    Admission diagnosis:  Intractable nausea and vomiting [R11.2] Patient Active Problem List   Diagnosis Date Noted   Gastric erythema 12/29/2023   Pulmonary embolism (HCC) 12/27/2023   Hypokalemia 12/27/2023   Open wound anterior abdominal wall 12/27/2023   Intractable nausea and vomiting 12/26/2023   Sepsis (HCC) 12/09/2023   AKI (acute kidney injury) 12/09/2023   Acute hypoxic respiratory failure (HCC) 12/09/2023   S/P exploratory laparotomy 12/09/2023   Tubo-ovarian abscess 12/02/2023   Acute pyelonephritis 12/02/2023   BMI 50.0-59.9, adult (HCC) 12/02/2023   Obesity, class 3 (HCC) 12/02/2023   Sepsis due to gram-negative UTI (HCC) 12/01/2023   Metabolic dysfunction-associated steatotic liver disease (MASLD) 12/26/2022   Class 3 severe obesity with serious comorbidity  and body mass index (BMI) of 50.0 to 59.9 in adult (HCC) 11/20/2022   Physically inactive 11/20/2022   Cyst of right ovary 06/05/2022   Episodic cluster headache, not intractable 04/09/2022   Hypoventilation associated with obesity syndrome (HCC) 04/09/2022   Sleep-related headache 04/09/2022   Loud snoring 04/09/2022   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/2022   Anxiety and depression 04/09/2022   Adnexal mass 03/20/2022   Type 2 diabetes mellitus without complication, without long-term current use of insulin  (HCC) 02/08/2021   Symptomatic mammary hypertrophy 12/07/2019   Back pain 12/07/2019   Neck pain 12/07/2019   Prediabetes 11/04/2019   Hyperlipemia 11/04/2019   Vitamin D  deficiency 11/04/2019   Primary hypertension 03/22/2019   Hemorrhagic ovarian cyst 06/21/2017   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   Cellulitis 01/06/2013   History of cesarean section, classical 12/18/2012   Allergy 10/29/2012   PCP:  Buck Search, PA-C Pharmacy:   [id number] [place], [place] - [address] AT [facility] [address] [place] [address] Phone: [phone] Fax: [phone]  [id number] [place], [place] - [address] AT [facility] [address] & [facility] [address] [place] [address] Phone: [phone] Fax: [phone]  [id number] - [place], [place] - [address] AT [facility] [address] [place] [address] Phone: [phone] Fax: [phone]  [facility] [address] [place] [address] Phone: [phone] Fax:  [phone]  [place] [facility] [address] [place] [address] Phone: [phone] Fax: [phone]     Social Drivers of Health (SDOH) Social History: SDOH Screenings   Food Insecurity: No Food Insecurity (12/26/2023)   Housing: Low Risk  (12/26/2023)  Transportation Needs: No Transportation Needs (12/26/2023)  Utilities: Not At Risk (12/26/2023)  Depression (PHQ2-9): Low Risk  (08/15/2022)  Financial Resource Strain: Not on File (06/07/2021)   Received from [facility]  Physical Activity: Not on File (06/07/2021)   Received from [facility]  Social Connections: Not on File (11/04/2022)   Received from [facility]  Stress: Not on File (06/07/2021)   [facility]  Tobacco Use: Medium Risk (12/29/2023)   SDOH Interventions: Food Insecurity Interventions: Intervention Not Indicated Housing Interventions: Intervention Not Indicated Transportation Interventions: Intervention Not Indicated Utilities Interventions: Intervention Not Indicated   Readmission Risk Interventions     No data to display

## 2024-01-01 NOTE — Procedures (Signed)
 Vascular and Interventional Radiology Procedure Note  Patient: Misty Walsh DOB: 1990-03-04 Medical Record Number: [id number] Note Date/Time: 01/01/24 9:51 AM   Performing Physician: Thom Hall, MD Assistant(s): None  Diagnosis: Abscess  Procedure: ASPIRATION OF ANTERIOR ABDOMINAL ABSCESS(ES)  Anesthesia: Local Anesthetic Complications: None Estimated Blood Loss: Minimal Specimens: Sent for Gram Stain, Aerobe Culture, and Anerobe Culture  Findings:  Successful Ultrasound-guided DX aspiration of anterior abdominal abscess X2 10 mL drawn from midline collection, and 1 mL drawn from RLQ collection.   See detailed procedure note with images in PACS. The patient tolerated the procedure well without incident or complication and was returned to Recovery in stable condition.    Thom Hall, MD [facility] Interventional Radiology [facility]   Pager. [phone] Clinic. [phone]

## 2024-01-01 NOTE — Progress Notes (Signed)
 CM is a re-admit. Patient was sent home with [employer] ( previously known as [employer]) with peel/stick wound vac that is changed once a week. CM verified that patient has home wound vac in room and supplies at patient's home she shared.  While patient in house the [facility] RN here at [facility] will change weekly dressing last done here on Monday 11/10 and next one for 11/17 Monday and WOC RN is following.  When patient discharges patient will follow up with the [facility] # [phone] ; address is [address], [place], [place] [address].  When disposition is determined Randine Pope will need to be notified to meet office staff for the follow up appointment to assist with dressing change as was set up prior to admission. CM has notified Randine [phone] of patient's admisson.  CM met with patient in her room and she shared that her child was at home with his father and he has been staying with them and helping care for him throughout her hospitalizations.  Patient teary eye and shared she felt depressed some but had been doing pretty good but this had been hard being away from her child and being sick. Patient shared she had been on medicine for depression for years in past but had not taken any thing for a few years and had been doing better. CM discussed having the CSW come by tomorrow and see her and discuss this and she was agreeable.  Cm notified CSW at [facility] and she plans to see patient. 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  Currently she has no income she shared coming in. CM followed up with [employer] regarding medicaid application and they confirmed it had been submitted on 11/6 and may take up to 45-90 days for approval.  Patient made aware.  CM will continue to follow and assist as needed.

## 2024-01-01 NOTE — Progress Notes (Signed)
 CSW acknowledged consult and attempted to meet with patient. Patient reported that she recently received pain medication and feels drowsy, patient requested that CSW return at a later time. CSW agreed to return at a later time.   Suzen Law, LCSW Clinical Social Worker [facility] Cell#: [phone]

## 2024-01-01 NOTE — Consult Note (Signed)
 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 The patient is now s/p ex lap via vertical midline/LOA/removal of left adnexal cyst wall/washout/JP drain placement x 2 (left and right posterior gutters). Operative findings significant for extensive adhesions, difficulty identifying GYN anatomy with large amount of sero-sanguineous fluid during surgery. Surgical pathology showed a benign cyst wall with cyst lining denudation. Negative for malignancy. Cultures were negative during that admission. Hospital course was complicated by bilateral lower extremity DVTs and PE, now on anticoagulation. The patient then needed drainage of another collection by IR on October 29th and a transgluteal drain was placed. Staples and surgical drains were removed on November 1st. Unfortunately, superior aspect of incision opened superficially with depth of 1cm. No evidence of infection was noted. Wound vac in place. The patient now presents back to the hospital 3 days after discharge with complaints of nausea, vomiting and abdominal pain. Repeat imaging shows persistent fluid collections but smaller. Fluid collection was aspirated today by IR. GI consulted and she is s/p EGD with no acute findings.  - The patient is afebrile and hemodynamically stable - No leukocytosis noted - Cultures from her last hospital admission were NGTD. Pathology showed a benign cyst. - Not on any antibiotics at this time and do not recommend starting any  - Will follow up on cultures obtained this admission  - Further recommendations to follow   Principal Problem:   Intractable  nausea and vomiting Active Problems:   Hyperlipemia   Primary hypertension   Type 2 diabetes mellitus without complication, without long-term current use of insulin  (HCC)   Tubo-ovarian abscess   AKI (acute kidney injury)   S/P exploratory laparotomy   Pulmonary embolism (HCC)   Hypokalemia   Open wound anterior abdominal wall   Gastric erythema   Chlorhexidine Gluconate Cloth  6 each Topical Daily   enoxaparin (LOVENOX) injection  120 mg Subcutaneous Q12H   feeding supplement  1 Container Oral TID BM   glycopyrrolate   1 mg Oral BID   hydrALAZINE  10 mg Intravenous Q6H   insulin  aspart  0-20 Units Subcutaneous TID WC   insulin  aspart  0-5 Units Subcutaneous QHS   lidocaine-EPINEPHrine  (PF)  20 mL Infiltration Once   LORazepam  1 mg Intravenous Q6H   metoCLOPramide  (REGLAN ) injection  10 mg Intravenous Q6H   multivitamin with minerals  1 tablet Oral Daily   ondansetron  (ZOFRAN ) IV  4 mg Intravenous Q6H   pantoprazole  (PROTONIX ) IV  40 mg Intravenous Q12H   scopolamine   1 patch Transdermal Q72H   HPI: Misty Walsh is a 33 y.o. female with recurrent pelvic abscess who was admitted on December 26, 2023 with N/V. The patient was initially admitted December 01, 2023 - November 4th after presenting to the ED via EMS with complaints of nausea and dry heaving.The patient was found to have a 20cm pelvic abscess/likely recurrent tubo-ovarian abscess as seen on imaging. The patient was evaluated by IR at both [facility] and [facility] and both did not feel as though they could drain it. On October 20th, the patient was taken to the OR by  Gynecology and General Surgery where the patient had an ex lap via vertical midline/LOA/removal of left adnexal cyst wall/washout/JP drain placement x 2 (left and right posterior gutters). Operative findings significant for extensive adhesions, difficulty identifying GYN anatomy with large amount of sero-sanguineous fluid during surgery. Surgical pathology showed  a benign cyst wall with cyst lining denudation. Negative for malignancy. Cultures were negative during that admission. Hospital course was complicated by bilateral lower extremity DVTs and PE, now on anticoagulation. The patient then needed another drainage of a collection by IR on October 29th and a transgluteal drain was placed. Staples and surgical drains were removed on November 1st. Unfortunately, superior aspect of incision opened superficially with depth of 1cm. No evidence of infection was noted. Wound vac in place. The patient now presents with nausea, vomiting and abdominal pain. GI was consulted and the patient is now s/p EGD that was negative for any acute findings. CT A/P findings on 11/7 showed interval removal of previous ventral abdominal wall drains with new right transgluteal drain. The dominant fluid collection in the pelvic cul-de-sac as decreased from prior after drain placement. Increasing right ventral abdominal wall/rectus sheath fluid-gas collection and midline low anterior abdominal subcutaneous fluid collection have increased from prior. Similar mild right hydroureteronephrosis with abrupt tapering of the right ureter at the pelvic collection; resolution of prior left hydroureteronephrosis. Repeat CT A/P on 11/10 showed Similar right adnexal abscess with percutaneous drain in place. Subcutaneous collection of fluid and air involving the right rectus abdominus muscle as has decreased slightly in size in the interval and is worrisome for an abscess.Retro umbilical fluid  collection is stable to minimally enlarged and may represent a seroma. Difficult to exclude abscess.Persistent moderate right hydronephrosis with decreased renal function, likely related to the aforementioned pelvic abscess. IR was consulted and they aspirated a fluid collection and sent it for culture. Infectious Disease was consulted for antibiotic recommendations given these persistent fluid collections  Review of  Systems: Review of Systems  Constitutional:  Negative for chills and fever.  HENT:  Negative for sore throat.   Eyes:  Negative for blurred vision.  Respiratory:  Negative for cough and shortness of breath.   Cardiovascular:  Negative for chest pain.  Gastrointestinal:  Positive for abdominal pain, constipation, nausea and vomiting.  Genitourinary:  Negative for dysuria.  Musculoskeletal:  Negative for joint pain.  Skin:  Negative for itching and rash.  Neurological:  Negative for focal weakness and headaches.   Past Medical History:  Diagnosis Date   Anxiety    Back pain    Chlamydia    Depression med made her navel itching and  made her sleepy so she quit taking them   Diabetes mellitus without complication (HCC)    Edema, lower extremity    Elevated cholesterol    Fibrocystic breast changes of both breasts    GERD (gastroesophageal reflux disease)    History of cesarean section, classical 12/18/2012   2014    Human papilloma virus    Hypertension    Insomnia    Joint pain    Moderate dysplasia of cervix    Numbness    right arm to hand   Ovarian cyst    Prediabetes    Tubo-ovarian abscess 12/02/2023   Vitamin D  deficiency    Social History   Tobacco Use   Smoking status: Former    Current packs/day: 0.00    Average packs/day: 0.3 packs/day for 5.0 years (1.3 ttl pk-yrs)    Types:  Cigarettes    Start date: 12/20/2014    Quit date: 12/20/2019    Years since quitting: 4.0   Smokeless tobacco: Never  Vaping Use   Vaping status: Never Used  Substance Use Topics   Alcohol use: Not Currently    Comment: occ   Drug use: No   Family History  Problem Relation Age of Onset   Diabetes Mother    Heart disease Mother    Hypertension Mother    Depression Mother    Stroke Mother    Arthritis Mother    Anxiety disorder Mother    Stroke Father    High blood pressure Father    High Cholesterol Father    Depression Father    Anxiety disorder Father    Obesity Father     Bipolar disorder Father    Asthma Sister    Kidney disease Brother        genetic condition   Hypertension Maternal Grandmother    Dementia Maternal Grandmother    Heart disease Maternal Grandmother    Diabetes Paternal Grandmother    Diabetes Maternal Aunt    Hypertension Maternal Aunt    Asthma Maternal Aunt    Epilepsy Maternal Aunt    Allergies  Allergen Reactions   Macrobid  [Nitrofurantoin  Macrocrystal] Itching    Caused the patient to feel hot.   OBJECTIVE: Blood pressure (!) 150/86, pulse 97, temperature 98.5 F (36.9 C), temperature source Oral, resp. rate 19, height 5' 4 (1.626 m), weight 128.6 kg, last menstrual period 11/18/2023, SpO2 97%.  General: Well developed, well nourished female  in no apparent distress HENT: Moist mucous membranes, normal nose, normal external ears, and normocephalic Neck: Supple, trachea midline, and normal cervical range of motion Eyes: PERRL, EOMI, non-icteric, and normal conjunctivae and lids Lungs: Clear to auscultation bilaterally. No wheezing, rales or rhonchi Cardiac: Regular rate and rhythm. No murmurs, rubs or gallops. No peripheral edema Abdomen: Soft, Non distended, Non tender, active bowel sounds. Wound vac in place  Skin: Intact, no focal erythema or rash, and warm and dry GU: Defered genital exam Musculoskeletal: No obvious skeletal abnormalities Neuro: Alert, no focal neurologic deficits, moves all extremities Psych: Oriented x 3. Flat affect   Lab Results Lab Results  Component Value Date   WBC 4.9 01/01/2024   HGB 9.1 (L) 01/01/2024   HCT 28.0 (L) 01/01/2024   MCV 96.6 01/01/2024   PLT 253 01/01/2024    Lab Results  Component Value Date   CREATININE 1.27 (H) 01/01/2024   BUN 5 (L) 01/01/2024   NA 134 (L) 01/01/2024   K 3.2 (L) 01/01/2024   CL 99 01/01/2024   CO2 25 01/01/2024    Lab Results  Component Value Date   ALT 21 12/27/2023   AST 27 12/27/2023   ALKPHOS 54 12/27/2023   BILITOT 1.0 12/27/2023     Microbiology: Recent Results (from the past 240 hours)  Aerobic/Anaerobic Culture w Gram Stain (surgical/deep wound)     Status: None (Preliminary result)   Collection Time: 01/01/24  9:49 AM   Specimen: Abscess  Result Value Ref Range Status   Specimen Description ABSCESS  Final   Special Requests NONE  Final   Gram Stain   Final    FEW WBC PRESENT, PREDOMINANTLY PMN NO ORGANISMS SEEN Performed at [facility], [address], [place], [place] [address]    Culture PENDING  Incomplete   Report Status PENDING  Incomplete  Aerobic/Anaerobic Culture w Gram Stain (surgical/deep  wound)     Status: None (Preliminary result)   Collection Time: 01/01/24  9:51 AM   Specimen: Abscess  Result Value Ref Range Status   Specimen Description ABSCESS  Final   Special Requests NONE  Final   Gram Stain   Final    ABUNDANT WBC PRESENT, PREDOMINANTLY PMN NO ORGANISMS SEEN Performed at [facility], [address] [place], [place] [address]    Culture PENDING  Incomplete   Report Status PENDING  Incomplete   Evalene Munch, MD [facility]  01/01/2024 5:01 PM

## 2024-01-01 NOTE — Progress Notes (Signed)
 PT Cancellation Note  Patient Details Name: Misty Walsh MRN: [id number] DOB: August 30, 1990   Cancelled Treatment:    Reason Eval/Treat Not Completed: Patient at procedure or test/unavailable. Pt in IR to drain new pelvic abscess. PT to re-attempt as time allows.   Erven Sari Shaker 01/01/2024, 9:44 AM

## 2024-01-02 DIAGNOSIS — X58XXXD Exposure to other specified factors, subsequent encounter: Secondary | ICD-10-CM

## 2024-01-02 DIAGNOSIS — I10 Essential (primary) hypertension: Secondary | ICD-10-CM

## 2024-01-02 DIAGNOSIS — Z794 Long term (current) use of insulin: Secondary | ICD-10-CM

## 2024-01-02 DIAGNOSIS — N7093 Salpingitis and oophoritis, unspecified: Secondary | ICD-10-CM

## 2024-01-02 LAB — CBC WITH DIFFERENTIAL/PLATELET
Abs Immature Granulocytes: 0.02 K/uL (ref 0.00–0.07)
Basophils Absolute: 0 K/uL (ref 0.0–0.1)
Basophils Relative: 1 %
Eosinophils Absolute: 0.2 K/uL (ref 0.0–0.5)
Eosinophils Relative: 4 %
HCT: 27.6 % — ABNORMAL LOW (ref 36.0–46.0)
Hemoglobin: 8.9 g/dL — ABNORMAL LOW (ref 12.0–15.0)
Immature Granulocytes: 1 %
Lymphocytes Relative: 31 %
Lymphs Abs: 1.4 K/uL (ref 0.7–4.0)
MCH: 31.3 pg (ref 26.0–34.0)
MCHC: 32.2 g/dL (ref 30.0–36.0)
MCV: 97.2 fL (ref 80.0–100.0)
Monocytes Absolute: 0.6 K/uL (ref 0.1–1.0)
Monocytes Relative: 13 %
Neutro Abs: 2.2 K/uL (ref 1.7–7.7)
Neutrophils Relative %: 50 %
Platelets: 243 K/uL (ref 150–400)
RBC: 2.84 MIL/uL — ABNORMAL LOW (ref 3.87–5.11)
RDW: 14.2 % (ref 11.5–15.5)
WBC: 4.4 K/uL (ref 4.0–10.5)
nRBC: 0 % (ref 0.0–0.2)

## 2024-01-02 LAB — BASIC METABOLIC PANEL WITH GFR
Anion gap: 13 (ref 5–15)
BUN: <5 mg/dL — ABNORMAL LOW (ref 6–20)
CO2: 22 mmol/L (ref 22–32)
Calcium: 8.5 mg/dL — ABNORMAL LOW (ref 8.9–10.3)
Chloride: 101 mmol/L (ref 98–111)
Creatinine, Ser: 1.32 mg/dL — ABNORMAL HIGH (ref 0.44–1.00)
GFR, Estimated: 54 mL/min — ABNORMAL LOW (ref >60)
Glucose, Bld: 85 mg/dL (ref 70–99)
Potassium: 3.6 mmol/L (ref 3.5–5.1)
Sodium: 136 mmol/L (ref 135–145)

## 2024-01-02 LAB — GLUCOSE, CAPILLARY
Glucose-Capillary: 92 mg/dL (ref 70–99)
Glucose-Capillary: 87 mg/dL (ref 70–99)
Glucose-Capillary: 90 mg/dL (ref 70–99)

## 2024-01-02 LAB — MAGNESIUM: Magnesium: 1.8 mg/dL (ref 1.7–2.4)

## 2024-01-02 MED ORDER — ENSURE PLUS HIGH PROTEIN PO LIQD
237.0000 mL | Freq: Three times a day (TID) | ORAL | Status: DC
  Administered 2024-01-02: 237 mL via ORAL
  Filled 2024-01-02 (×12): qty 237

## 2024-01-02 MED ORDER — GLYCOPYRROLATE 0.2 MG/ML IJ SOLN
0.2000 mg | Freq: Four times a day (QID) | INTRAMUSCULAR | Status: DC
  Administered 2024-01-02 – 2024-01-04 (×7): 0.2 mg via INTRAVENOUS
  Filled 2024-01-02 (×7): qty 1

## 2024-01-02 NOTE — Progress Notes (Signed)
 Gynecology Progress Note  Admission Date: 12/26/2023 Current Date: 01/02/2024 4:15 PM  Misty Walsh is a 33 y.o. G1P0101 admitted for acute on chronic nausea and vomiting.  History complicated by: Patient Active Problem List   Diagnosis Date Noted   Gastric erythema 12/29/2023   Pulmonary embolism (HCC) 12/27/2023   Hypokalemia 12/27/2023   Open wound anterior abdominal wall 12/27/2023   Intractable nausea and vomiting 12/26/2023   Sepsis (HCC) 12/09/2023   AKI (acute kidney injury) 12/09/2023   Acute hypoxic respiratory failure (HCC) 12/09/2023   S/P exploratory laparotomy 12/09/2023   Tubo-ovarian abscess 12/02/2023   Acute pyelonephritis 12/02/2023   BMI 50.0-59.9, adult (HCC) 12/02/2023   Obesity, class 3 (HCC) 12/02/2023   Sepsis due to gram-negative UTI (HCC) 12/01/2023   Metabolic dysfunction-associated steatotic liver disease (MASLD) 12/26/2022   Class 3 severe obesity with serious comorbidity and body mass index (BMI) of 50.0 to 59.9 in adult (HCC) 11/20/2022   Physically inactive 11/20/2022   Cyst of right ovary 06/05/2022   Episodic cluster headache, not intractable 04/09/2022   Hypoventilation associated with obesity syndrome (HCC) 04/09/2022   Sleep-related headache 04/09/2022   Loud snoring 04/09/2022   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/2022   Anxiety and depression 04/09/2022   Adnexal mass 03/20/2022   Type 2 diabetes mellitus without complication, without long-term current use of insulin  (HCC) 02/08/2021   Symptomatic mammary hypertrophy 12/07/2019   Back pain 12/07/2019   Neck pain 12/07/2019   Prediabetes 11/04/2019   Hyperlipemia 11/04/2019   Vitamin D  deficiency 11/04/2019   Primary hypertension 03/22/2019   Hemorrhagic ovarian cyst 06/21/2017   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   Cellulitis 01/06/2013   History of cesarean section, classical 12/18/2012   Allergy 10/29/2012   ROS and patient/family/surgical history,  located on admission H&P note dated 12/26/2023, have been reviewed, and there are no changes except as noted below  Yesterday/Overnight Events:  Nursing states that is still not wanting to get out of bed  Subjective:  Patient reports continued nausea but was able to tolerate full liquids. Wants to try more food but worried about persistent spitting, feels like oral Robinul  is not working.  Desires IV Robinul .  No VB, chest pain, sob, fevers, chills.     Objective:    Current Vital Signs 24h Vital Sign Ranges  T 98.3 F (36.8 C) Temp  Avg: 98.4 F (36.9 C)  Min: 98.3 F (36.8 C)  Max: 98.4 F (36.9 C)  BP (!) 146/77 BP  Min: 146/77  Max: 151/83  HR 92 Pulse  Avg: 93.3  Min: 92  Max: 96  RR 16 Resp  Avg: 17.3  Min: 16  Max: 18  SaO2 95 % Room Air SpO2  Avg: 94 %  Min: 93 %  Max: 95 %       24 Hour I/O Current Shift I/O  Time Ins Outs 11/13 0701 - 11/14 0700 In: 696.3 [I.V.:691.3] Out: 90 [Urine:75; Drains:15] 11/14 0701 - 11/14 1900 In: 125 [P.O.:120] Out: 105 [Urine:100; Drains:5]   Patient Vitals for the past 12 hrs:  BP Temp Temp src Pulse Resp SpO2  01/02/24 0919 (!) 146/77 98.3 F (36.8 C) Oral 92 16 95 %   Physical exam: General appearance: alert and appears stated age Abdomen: diffuse tenderness on palpation, no rebound or guarding,  Lungs: no respiratory distress Psych: depressed and withdrawn Neurologic: Grossly normal  IR drains in place small amount sero-sanguinous fluid  Medications Current Facility-Administered  Medications  Medication Dose Route Frequency Provider Last Rate Last Admin   bisacodyl (DULCOLAX) suppository 10 mg  10 mg Rectal Once PRN Izell Harari, MD       Chlorhexidine Gluconate Cloth 2 % PADS 6 each  6 each Topical Daily Cleatus Moccasin, MD   6 each at 01/02/24 1203   diphenhydrAMINE  (BENADRYL ) injection 25 mg  25 mg Intravenous Q6H PRN Cleatus Moccasin, MD   25 mg at 01/02/24 1601   enoxaparin (LOVENOX) injection 120 mg  120 mg Subcutaneous  Q12H Pyrtle, Gordy HERO, MD   120 mg at 01/02/24 1203   feeding supplement (ENSURE PLUS HIGH PROTEIN) liquid 237 mL  237 mL Oral TID BM Cloys Vera A, MD   237 mL at 01/02/24 1528   glycopyrrolate  (ROBINUL ) injection 0.2 mg  0.2 mg Intravenous QID Sirenity Shew A, MD   0.2 mg at 01/02/24 1601   guaiFENesin  (ROBITUSSIN) 100 MG/5ML liquid 15 mL  15 mL Oral Q4H PRN Pratt, Tanya S, MD       haloperidol lactate (HALDOL) injection 1 mg  1 mg Intravenous Q6H PRN Charle Mclaurin A, MD   1 mg at 01/01/24 1657   hydrALAZINE (APRESOLINE) injection 10 mg  10 mg Intravenous Q6H Cleatus Moccasin, MD   10 mg at 01/02/24 1527   HYDROmorphone  (DILAUDID ) injection 0.2-0.6 mg  0.2-0.6 mg Intravenous Q2H PRN Pratt, Tanya S, MD   0.5 mg at 01/01/24 1345   insulin  aspart (novoLOG) injection 0-20 Units  0-20 Units Subcutaneous TID WC Akula, Vijaya, MD       insulin  aspart (novoLOG) injection 0-5 Units  0-5 Units Subcutaneous QHS Akula, Vijaya, MD       lidocaine-EPINEPHrine  (PF) (XYLOCAINE-EPINEPHrine ) 1 %-1:200000 (PF) injection 20 mL  20 mL Infiltration Once Mugweru, Jon, MD       LORazepam (ATIVAN) injection 1 mg  1 mg Intravenous Q6H Cleatus Moccasin, MD   1 mg at 01/02/24 0950   menthol  (CEPACOL) lozenge 3 mg  1 lozenge Oral Q2H PRN Pratt, Tanya S, MD       metoCLOPramide  (REGLAN ) injection 10 mg  10 mg Intravenous Q6H Pratt, Tanya S, MD   10 mg at 01/02/24 1527   metoprolol tartrate (LOPRESSOR) injection 5 mg  5 mg Intravenous Q5 min PRN Cleatus Moccasin, MD       multivitamin with minerals tablet 1 tablet  1 tablet Oral Daily Izell Harari, MD       ondansetron  (ZOFRAN ) injection 4 mg  4 mg Intravenous Q6H Cleatus Moccasin, MD   4 mg at 01/02/24 1527   pantoprazole  (PROTONIX ) injection 40 mg  40 mg Intravenous Q12H Esterwood, Amy S, PA-C   40 mg at 01/02/24 1203   polyethylene glycol (MIRALAX  / GLYCOLAX ) packet 17 g  17 g Oral Daily PRN Pratt, Tanya S, MD       prochlorperazine  (COMPAZINE ) injection 10 mg  10 mg  Intravenous Q6H PRN Pratt, Tanya S, MD   10 mg at 12/30/23 0512   scopolamine  (TRANSDERM-SCOP) 1 MG/3DAYS 1 mg  1 patch Transdermal Q72H Pratt, Tanya S, MD   1 mg at 01/01/24 2033   sodium chloride  flush (NS) 0.9 % injection 10-40 mL  10-40 mL Intracatheter PRN Cleatus Moccasin, MD   10 mL at 12/31/23 1333   Labs  EGD biopsies negative Mg 2.1  Recent Labs  Lab 12/31/23 0446 01/01/24 0513 01/02/24 0613  WBC 5.1 4.9 4.4  HGB 9.0* 9.1* 8.9*  HCT 27.7* 28.0* 27.6*  PLT 256 253 243    Recent Labs  Lab 12/26/23 1842 12/27/23 0917 12/28/23 1221 12/31/23 0446 01/01/24 0513 01/02/24 0613  NA  --  135   < > 136 134* 136  K  --  3.4*   < > 3.1* 3.2* 3.6  CL  --  98   < > 99 99 101  CO2  --  26   < > 25 25 22   BUN  --  12   < > 6 5* <5*  CREATININE  --  1.12*   < > 1.27* 1.27* 1.32*  CALCIUM   --  9.4   < > 8.2* 8.2* 8.5*  PROT 7.9 7.3  --   --   --   --   BILITOT 1.0 1.0  --   --   --   --   ALKPHOS 58 54  --   --   --   --   ALT 21 21  --   --   --   --   AST 36 27  --   --   --   --   GLUCOSE  --  112*   < > 84 91 85   < > = values in this interval not displayed.    Radiology IR US  Guide Bx Asp/Drain Result Date: 01/02/2024 INDICATION: Abscess. Briefly, 33 year old female with parapelvic gaseous s/p drain placement and recent imaging with anterior abdominal wall fluid collection suspicious for subcutaneous abscesses. EXAM: US -GUIDED ASPIRATION OF ANTERIOR ABDOMINAL WALL SUBCUTANEOUS FLUID COLLECTION/ABSCESS X2 COMPARISON:  CT CAP, 12/29/2023. MEDICATIONS: The patient is currently admitted to the hospital and receiving intravenous antibiotics. The antibiotics were administered within an appropriate time frame prior to the initiation of the procedure. ANESTHESIA/SEDATION: Local anesthetic was administered. CONTRAST:  None COMPLICATIONS: None immediate. PROCEDURE: Informed written consent was obtained from the patient after a discussion of the risks, benefits and alternatives to  treatment. Preprocedural ultrasound scanning demonstrated two (2) anterior abdominal wall fluid collections; one midline and superficial at the umbilicus and the other deeper and RIGHT paraumbilical. A timeout was performed prior to the initiation of the procedure. The midline and RIGHT lower quadrant abdomen was prepped and draped in the usual sterile fashion. The overlying soft tissues were anesthetized with 1% lidocaine with epinephrine . Procedure began at the midline collection, and under direct ultrasound guidance, a 18 gauge trocar needle was advanced into the abscess/fluid collection. Next, approximately 10 mL of serosanguineous fluid was aspirated from the collection. The procedure was then repeated for the deeper, RIGHT paraumbilical fluid collection with aspiration yielding 1 mL of serosanguineous fluid. Multiple ultrasound images were saved for procedural documentation purposes. Representative samples of aspirated fluid was capped and sent to the laboratory for analysis. The needles were removed and superficial hemostasis was achieved with manual compression. A dressing was placed. The patient tolerated the procedure well without immediate postprocedural complication. IMPRESSION: Successful US -guided diagnostic aspiration of two (2) anterior abdominal wall fluid collections; midline at umbilicus and deeper, RIGHT paraumbilical. The collection yielded of 10 mL and 1 mL of serosanguineous fluid, respectively. Representative samples were submitted for microbiological analysis as requested by the clinical team. Thom Hall, MD [facility] Radiology Electronically Signed   By: Thom Hall M.D.   On: 01/02/2024 09:11   IR US  Guide Bx Asp/Drain Result Date: 01/02/2024 INDICATION: Abscess. Briefly, 33 year old female with parapelvic gaseous s/p drain placement and recent imaging with anterior abdominal wall fluid collection suspicious for subcutaneous  abscesses. EXAM: US -GUIDED ASPIRATION OF ANTERIOR ABDOMINAL WALL SUBCUTANEOUS FLUID COLLECTION/ABSCESS X2 COMPARISON:  CT CAP, 12/29/2023. MEDICATIONS: The patient is currently admitted to the hospital and receiving intravenous antibiotics. The antibiotics were administered within an appropriate time frame prior to the initiation of the procedure. ANESTHESIA/SEDATION: Local anesthetic was administered. CONTRAST:  None COMPLICATIONS: None immediate. PROCEDURE: Informed written consent was obtained from the patient after a discussion of the risks, benefits and alternatives to treatment. Preprocedural ultrasound scanning demonstrated two (2) anterior abdominal wall fluid collections; one midline and superficial at the umbilicus and the other deeper and RIGHT paraumbilical. A timeout was performed prior to the initiation of the procedure. The midline and RIGHT lower quadrant abdomen was prepped and draped in the usual sterile fashion. The overlying soft tissues were anesthetized with 1% lidocaine with epinephrine . Procedure began at the midline collection, and under direct ultrasound guidance, a 18 gauge trocar needle was advanced into the abscess/fluid collection. Next, approximately 10 mL of serosanguineous fluid was aspirated from the collection. The procedure was then repeated for the deeper, RIGHT paraumbilical fluid collection with aspiration yielding 1 mL of serosanguineous fluid. Multiple ultrasound images were saved for procedural documentation purposes. Representative samples of aspirated fluid was capped and sent to the laboratory for analysis. The needles were removed and superficial hemostasis was achieved with manual compression. A dressing was placed. The patient tolerated the procedure well without immediate postprocedural complication. IMPRESSION: Successful US -guided diagnostic aspiration of two (2) anterior abdominal wall fluid collections; midline at umbilicus and deeper, RIGHT paraumbilical. The  collection yielded of 10 mL and 1 mL of serosanguineous fluid, respectively. Representative samples were submitted for microbiological analysis as requested by the clinical team. Thom Hall, MD [facility] Radiology Electronically Signed   By: Thom Hall M.D.   On: 01/02/2024 09:11      Assessment & Plan:  Patient stable *GYN: She is s/p IR drain procedure on 11/13, will continue to monitor output and appreciate IR input.  She is not on abx anymore for TOA/pelvic abscesses, not needed as per ID, appreciate their inout.   WOC has been consulted and [place] changed on 11/10 and plan to change in 1 week (11/17).  Hospitalist team following along for below co-morbidities. *GI: Still no clear etiology, aside from healing from abdominal/pelvic abscesses, hoping draining the residual abscess today will make her feel better. GI has signed off.  IV Robinul  ordered and IV haldol prn for the nausea also on IV ativan prn. Nutrition was also consulted;  we will advance diet as tolerated as per patient's request. -s/p 11/10 EGD this morning with negative findings and final path biopsies.   *HTN: Continue medications, and co-managed with TRH *DM2: Continue medications, and co-managed with TRH *Pain: controlled with IV meds *FEN/GI: ADAT, replete electrolytes PRN *Heme: continue lovenox until able to take PO.  *PT: consulted to help with ambulation.  Bedside commode to help patient. *AKI: stable *Psych:  Transitions of care recommended Psych referral, but patient refused this.  Will continue to monitor her mental status closely.   Code Status: Full Code  Total time taking care of the patient was 35 minutes, with greater than 50% of the time spent in face to face interaction with the patient.  Gloris Hugger, MD Attending, [facility]Faculty Practice) GYN Consult Phone: [phone] (M-F, 0800-1700) & [phone] (Off hours, weekends,  holidays)

## 2024-01-02 NOTE — Progress Notes (Signed)
 Referring Provider(s): Dr. Bebe Furry, MD   Supervising Physician: Hughes Simmonds   Patient Status:  Misty Walsh - In-pt   Chief Complaint: Pelvic abscess; s/p pelvic drain placement 10/29 with Dr. Vanice, and abdominal wall fluid collection s/p aspiration on 11/13 by Dr. Hughes.   Subjective:   Patient alert and laying in bed, calm. She appears with flat affect, and is nauseated, spitting up in emesis bag. RN staff just changed bedding and cleaned patient up.  Currently without any significant complaints aside from nausea and vomiting. Patient denies any fevers, chest pain, SOB, cough, or bleeding.    Allergies: Macrobid  [nitrofurantoin  macrocrystal]  Medications: Prior to Admission medications   Medication Sig Start Date End Date Taking? Authorizing Provider  amLODipine (NORVASC) 5 MG tablet Take 1 tablet (5 mg total) by mouth daily. 12/14/23   Zina Jerilynn LABOR, MD  apixaban (ELIQUIS) 5 MG TABS tablet Take 2 tablets by mouth twice a day for 3 doses then start 1 tablet twice a day thereafter 12/23/23   Arnold, James G, MD  atorvastatin (LIPITOR) 80 MG tablet Take 1 tablet (80 mg total) by mouth daily. 12/14/23   Ozan, Jennifer, DO  chlorthalidone  (HYGROTON ) 25 MG tablet Take 1 tablet (25 mg total) by mouth daily. 12/14/23   Zina Jerilynn LABOR, MD  glycopyrrolate  (ROBINUL ) 1 MG tablet Take 1 tablet (1 mg total) by mouth 3 (three) times daily. 12/14/23   Zina Jerilynn LABOR, MD  losartan (COZAAR) 50 MG tablet Take 1 tablet (50 mg total) by mouth daily. 12/14/23   Zina Jerilynn LABOR, MD  metFORMIN (GLUCOPHAGE) 500 MG tablet Take 1 tablet (500 mg total) by mouth 2 (two) times daily with a meal. 12/23/23   Jayne Vonn DEL, MD  metoCLOPramide  (REGLAN ) 10 MG tablet Take 1 tablet (10 mg total) by mouth every 8 (eight) hours for 14 days. 12/23/23 01/06/24  Jayne Vonn DEL, MD  ondansetron  (ZOFRAN ) 4 MG tablet Take 1 tablet (4 mg total) by mouth every 6 (six) hours as needed for nausea or vomiting. 12/14/23    Zina Jerilynn LABOR, MD  promethazine  (PHENERGAN ) 25 MG tablet Take 1 tablet (25 mg total) by mouth every 6 (six) hours as needed for refractory nausea / vomiting. 12/13/23   Ozan, Jennifer, DO  scopolamine  (TRANSDERM-SCOP) 1 MG/3DAYS Place 1 patch (1 mg total) onto the skin every 3 (three) days. 12/15/23   Zina Jerilynn LABOR, MD  sodium chloride  flush (NS) 0.9 % SOLN Place 10 mLs into feeding tube daily AND flush drain once daily with 5 mL saline from prefilled syringe. 12/23/23 02/21/24  Jayne Vonn DEL, MD     Vital Signs: BP (!) 146/77 (BP Location: Left Arm)   Pulse 92   Temp 98.3 F (36.8 C) (Oral)   Resp 16   Ht 5' 4 (1.626 m)   Wt 283 lb 9.6 oz (128.6 kg)   LMP 11/18/2023 (Exact Date)   SpO2 95%   BMI 48.68 kg/m   Physical Exam Constitutional:      Appearance: Normal appearance.  Cardiovascular:     Rate and Rhythm: Normal rate.  Pulmonary:     Effort: Pulmonary effort is normal.  Abdominal:     General: Abdomen is flat.     Palpations: Abdomen is soft.     Tenderness: There is abdominal tenderness in the periumbilical area.     Comments: Midline wound vac in place. Patient moderately tender from midline wound, unchanged from prior days.   Aspiration site  covered up with bandage, under wound vac clear dressing, and cannot be removed, nor aspiration site inspected.  Musculoskeletal:        General: Normal range of motion.  Skin:    General: Skin is warm and dry.     Comments: Right transgluteal drain appropriately dressed. Dressing is clean, dry, intact. Drain incision site minimally tender, without evidence of infection. Retaining suture and Stat Lock in place.  5 cc serous output in collection bulb tubing. Line flushes well.   Neurological:     Mental Status: She is alert and oriented to person, place, and time.      Labs:  CBC: Recent Labs    12/30/23 0425 12/31/23 0446 01/01/24 0513 01/02/24 0613  WBC 5.3 5.1 4.9 4.4  HGB 9.5* 9.0* 9.1* 8.9*  HCT 28.7*  27.7* 28.0* 27.6*  PLT 307 256 253 243    COAGS: Recent Labs    12/05/23 0730 12/08/23 0436 12/15/23 1428 12/16/23 0849 12/17/23 0538 12/17/23 2117 12/18/23 0451  INR 1.2 1.2  --  1.3* 1.2  --   --   APTT  --   --    < > 85* 60* 79* 69*   < > = values in this interval not displayed.    BMP: Recent Labs    12/30/23 0425 12/31/23 0446 01/01/24 0513 01/02/24 0613  NA 137 136 134* 136  K 3.2* 3.1* 3.2* 3.6  CL 99 99 99 101  CO2 22 25 25 22   GLUCOSE 90 84 91 85  BUN 6 6 5* <5*  CALCIUM  8.6* 8.2* 8.2* 8.5*  CREATININE 1.41* 1.27* 1.27* 1.32*  GFRNONAA 50* 57* 57* 54*    LIVER FUNCTION TESTS: Recent Labs    12/13/23 0415 12/21/23 0423 12/26/23 1842 12/27/23 0917  BILITOT 0.5 0.2 1.0 1.0  AST 41 28 36 27  ALT 24 19 21 21   ALKPHOS 48 44 58 54  PROT 6.3* 6.5 7.9 7.3  ALBUMIN 2.3* 2.4* 3.5 3.2*    Assessment and Plan:  Pelvic abscess; s/p pelvic drain placement 10/29 with Dr. Vanice, and abdominal wall fluid collection s/p aspiration on 11/13 by Dr. Hughes.  Drain Location: right transgluteal pelvic drain  Size: Fr size: 10 Fr Date of placement: 12/17/23  Currently to: Drain collection device: suction bulb  24 hour output:  Output by Drain (mL) 12/31/23 0701 - 12/31/23 1900 12/31/23 1901 - 01/01/24 0700 01/01/24 0701 - 01/01/24 1900 01/01/24 1901 - 01/02/24 0700 01/02/24 0701 - 01/02/24 1120  Closed System Drain 3 Inferior;Right Back Bulb (JP) 10 Fr. 8   15     Interval imaging/drain manipulation:  CT C/A/P w/ CM on 11/12: IMPRESSION: 1. Similar right adnexal abscess with percutaneous drain in place. 2. Subcutaneous collection of fluid and air involving the right rectus abdominus muscle as has decreased slightly in size in the interval and is worrisome for an abscess. 3. Retro umbilical fluid collection is stable to minimally enlarged and may represent a seroma. Difficult to exclude abscess. 4. Persistent moderate right hydronephrosis with decreased  renal function, likely related to the aforementioned pelvic abscess. 5. Steatotic enlarged liver.   Current examination: Aspiration site covered by dressing and also by wound vac clear dressing. Unable to visualize aspiration access site without removing wound vac dressing.  Per patient report, periumbilical abdominal tenderness unchanged today from prior, mainly secondary to midline wound with wound vac in place.  Drain flushes easily.  Drain insertion site unremarkable. Drain retaining suture and stat lock  in place. Drain dressed appropriately.   Plan:  Monitor aspiration access sites at time of wound vac dressing changes. If any increasing tenderness in the interim, please advise IR. If no concern after 48 hours, remove aspiration site dressing, and maintain wound vac dressing per routine care.  Recent imaging demonstrates yet to resolve pelvic fluid collection, with drain in place. Per Dr. Vanice, patient should maintain drain to bulb, with daily flushing.  Continue TID flushes with 5 cc NS. Record output Q shift. Dressing changes QD or PRN if soiled.  Call IR APP or on call IR MD if difficulty flushing or sudden change in drain output.  Repeat imaging/possible drain injection once output < 10 mL/QD (excluding flush material). Consideration for drain removal if output is < 10 mL/QD (excluding flush material), pending discussion with the providing surgical service.  Discharge planning: Please contact IR APP or on call IR MD prior to patient d/c to ensure appropriate follow up plans are in place. Typically patient will follow up with IR clinic 10-14 days post d/c for repeat imaging/possible drain injection. IR scheduler will contact patient with date/time of appointment. Patient will need to flush drain QD with 5 cc NS, record output QD, dressing changes every 2-3 days or earlier if soiled.   IR will continue to follow - please call with questions or concerns.     Thank you for  this interesting consult.  I greatly enjoyed meeting RAVLEEN RIES and look forward to participating in their care.   Electronically Signed: Carlin DELENA Griffon, PA-C 01/02/2024, 10:51 AM     I spent a total of 15 Minutes at the the patient's bedside AND on the patient's hospital floor or unit, greater than 50% of which was counseling/coordinating care for right transgluteal pelvic drain.
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
## 2024-01-02 NOTE — Progress Notes (Signed)
 [facility] Date of Admission:  12/26/2023                  Reason for Consult: Pelvic Abscess                      Referring Provider: Elgie Butter, MD   Assessment and Plan: Tubo-ovarian/Pelvic Abscess  Nausea and Vomiting    The patient is a 33 year old female with a long and complicated hospital course beginning October 13th after a 20cm pelvic abscess/likely recurrent tubo-ovarian abscess was seen on imaging. The patient is now s/p ex lap via vertical midline/LOA/removal of left adnexal cyst wall/washout/JP drain placement x 2 (left and right posterior gutters). Operative findings significant for extensive adhesions, difficulty identifying GYN anatomy with large amount of sero-sanguineous fluid during surgery. Surgical pathology showed a benign cyst wall with cyst lining denudation. Negative for malignancy. Cultures were negative during that admission. Hospital course was complicated by bilateral lower extremity DVTs and PE, now on anticoagulation. The patient then needed drainage of another collection by IR on October 29th and a transgluteal drain was placed. Staples and surgical drains were removed on November 1st. Unfortunately, superior aspect of incision opened superficially with depth of 1cm. No evidence of infection was noted. Wound vac in place. The patient now presents back to the hospital 3 days after discharge with complaints of nausea, vomiting and abdominal pain. Repeat imaging shows persistent fluid collections but smaller. Fluid collection was aspirated on 11/13 by IR. No growth on aspirate to date  GI consulted and she is s/p EGD with no acute findings.  - The patient remains afebrile and hemodynamically stable - No leukocytosis noted - Cultures from her last hospital admission were NGTD. Pathology showed a benign cyst. - Not on any antibiotics at this time and do not recommend starting any  - Cultures from the aspirate on November 13th are currently  NGTD - Will following along peripherally to monitor cultures   Patient Active Problem List   Diagnosis Date Noted   Gastric erythema 12/29/2023   Pulmonary embolism (HCC) 12/27/2023   Hypokalemia 12/27/2023   Open wound anterior abdominal wall 12/27/2023   Intractable nausea and vomiting 12/26/2023   Sepsis (HCC) 12/09/2023   AKI (acute kidney injury) 12/09/2023   Acute hypoxic respiratory failure (HCC) 12/09/2023   S/P exploratory laparotomy 12/09/2023   Tubo-ovarian abscess 12/02/2023   Acute pyelonephritis 12/02/2023   BMI 50.0-59.9, adult (HCC) 12/02/2023   Obesity, class 3 (HCC) 12/02/2023   Sepsis due to gram-negative UTI (HCC) 12/01/2023   Metabolic dysfunction-associated steatotic liver disease (MASLD) 12/26/2022   Class 3 severe obesity with serious comorbidity and body mass index (BMI) of 50.0 to 59.9 in adult (HCC) 11/20/2022   Physically inactive 11/20/2022   Cyst of right ovary 06/05/2022   Episodic cluster headache, not intractable 04/09/2022   Hypoventilation associated with obesity syndrome (HCC) 04/09/2022   Sleep-related headache 04/09/2022   Loud snoring 04/09/2022   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/2022   Anxiety and depression 04/09/2022   Adnexal mass 03/20/2022   Type 2 diabetes mellitus without complication, without long-term current use of insulin  (HCC) 02/08/2021   Symptomatic mammary hypertrophy 12/07/2019   Back pain 12/07/2019   Neck pain 12/07/2019   Prediabetes 11/04/2019   Hyperlipemia 11/04/2019   Vitamin D  deficiency 11/04/2019   Primary hypertension 03/22/2019   Hemorrhagic ovarian cyst 06/21/2017   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  Cellulitis 01/06/2013   History of cesarean section, classical 12/18/2012   Allergy 10/29/2012   Current Discharge Medication List     CONTINUE these medications which have NOT CHANGED   Details  amLODipine (NORVASC) 5 MG tablet Take 1 tablet (5 mg total) by mouth daily. Qty:  30 tablet, Refills: 0    apixaban (ELIQUIS) 5 MG TABS tablet Take 2 tablets by mouth twice a day for 3 doses then start 1 tablet twice a day thereafter Qty: 66 tablet, Refills: 3   Associated Diagnoses: Acute deep vein thrombosis (DVT) of proximal vein of both lower extremities (HCC)    atorvastatin (LIPITOR) 80 MG tablet Take 1 tablet (80 mg total) by mouth daily. Qty: 30 tablet, Refills: 0    chlorthalidone  (HYGROTON ) 25 MG tablet Take 1 tablet (25 mg total) by mouth daily. Qty: 60 tablet, Refills: 0    glycopyrrolate  (ROBINUL ) 1 MG tablet Take 1 tablet (1 mg total) by mouth 3 (three) times daily. Qty: 90 tablet, Refills: 0    losartan (COZAAR) 50 MG tablet Take 1 tablet (50 mg total) by mouth daily. Qty: 60 tablet, Refills: 0    metFORMIN (GLUCOPHAGE) 500 MG tablet Take 1 tablet (500 mg total) by mouth 2 (two) times daily with a meal. Qty: 60 tablet, Refills: 3    metoCLOPramide  (REGLAN ) 10 MG tablet Take 1 tablet (10 mg total) by mouth every 8 (eight) hours for 14 days. Qty: 42 tablet, Refills: 0    ondansetron  (ZOFRAN ) 4 MG tablet Take 1 tablet (4 mg total) by mouth every 6 (six) hours as needed for nausea or vomiting. Qty: 20 tablet, Refills: 0    promethazine  (PHENERGAN ) 25 MG tablet Take 1 tablet (25 mg total) by mouth every 6 (six) hours as needed for refractory nausea / vomiting. Qty: 30 tablet, Refills: 0    scopolamine  (TRANSDERM-SCOP) 1 MG/3DAYS Place 1 patch (1 mg total) onto the skin every 3 (three) days. Qty: 10 patch, Refills: 0    sodium chloride  flush (NS) 0.9 % SOLN Place 10 mLs into feeding tube daily AND flush drain once daily with 5 mL saline from prefilled syringe. Qty: 600 mL, Refills: 0   Comments: Please dispense 10 mL prefilled syringes (#60)       Subjective: The patient was lying in bed with the sheet over her head. She never removed the sheet but she did answer my questions. She continues to complain of nausea and vomiting and inability to keep  anything PO down. No other complaints. No acute events overnight.   Review of Systems: Review of Systems  Constitutional:  Negative for chills and fever.  HENT:  Negative for sore throat.   Eyes:  Negative for blurred vision.  Respiratory:  Negative for cough and shortness of breath.   Cardiovascular:  Negative for chest pain.  Gastrointestinal:  Positive for abdominal pain, constipation, nausea and vomiting.  Genitourinary:  Negative for dysuria.  Musculoskeletal:  Negative for joint pain.  Skin:  Negative for itching and rash.  Neurological:  Negative for focal weakness and headaches.   Past Medical History:  Diagnosis Date   Anxiety    Back pain    Chlamydia    Depression med made her navel itching and  made her sleepy so she quit taking them   Diabetes mellitus without complication (HCC)    Edema, lower extremity    Elevated cholesterol    Fibrocystic breast changes of both breasts    GERD (gastroesophageal reflux disease)  History of cesarean section, classical 12/18/2012   2014    Human papilloma virus    Hypertension    Insomnia    Joint pain    Moderate dysplasia of cervix    Numbness    right arm to hand   Ovarian cyst    Prediabetes    Tubo-ovarian abscess 12/02/2023   Vitamin D  deficiency    Social History   Tobacco Use   Smoking status: Former    Current packs/day: 0.00    Average packs/day: 0.3 packs/day for 5.0 years (1.3 ttl pk-yrs)    Types: Cigarettes    Start date: 12/20/2014    Quit date: 12/20/2019    Years since quitting: 4.0   Smokeless tobacco: Never  Vaping Use   Vaping status: Never Used  Substance Use Topics   Alcohol use: Not Currently    Comment: occ   Drug use: No   Family History  Problem Relation Age of Onset   Diabetes Mother    Heart disease Mother    Hypertension Mother    Depression Mother    Stroke Mother    Arthritis Mother    Anxiety disorder Mother    Stroke Father    High blood pressure Father    High  Cholesterol Father    Depression Father    Anxiety disorder Father    Obesity Father    Bipolar disorder Father    Asthma Sister    Kidney disease Brother        genetic condition   Hypertension Maternal Grandmother    Dementia Maternal Grandmother    Heart disease Maternal Grandmother    Diabetes Paternal Grandmother    Diabetes Maternal Aunt    Hypertension Maternal Aunt    Asthma Maternal Aunt    Epilepsy Maternal Aunt    Allergies  Allergen Reactions   Macrobid  [Nitrofurantoin  Macrocrystal] Itching    Caused the patient to feel hot.   Objective: Vitals:   01/01/24 1309 01/01/24 2030 01/01/24 2354 01/02/24 0919  BP: (!) 150/86 (!) 151/83 (!) 148/73 (!) 146/77  Pulse: 97 92 96 92  Resp: 19 18 18 16   Temp: 98.5 F (36.9 C) 98.4 F (36.9 C)  98.3 F (36.8 C)  TempSrc: Oral Oral  Oral  SpO2: 97% 93% 94% 95%  Height:       Body mass index is 48.68 kg/m. General: Well developed, well nourished female in no apparent distress HENT: Moist mucous membranes, normal nose, normal external ears, and normocephalic Neck: Supple, trachea midline, and normal cervical range of motion Eyes: PERRL, EOMI, non-icteric, and normal conjunctivae and lids Lungs: Clear to auscultation bilaterally. No wheezing, rales or rhonchi Cardiac: Regular rate and rhythm. No murmurs, rubs or gallops. No peripheral edema Abdomen: Soft, Non distended, Non tender, active bowel sounds. Wound vac in place  Skin: Intact, no focal erythema or rash, and warm and dry GU: Defered genital exam Musculoskeletal: No obvious skeletal abnormalities Neuro: Alert, no focal neurologic deficits, moves all extremities Psych: Oriented x 3. Flat affect   Problem List Items Addressed This Visit       Cardiovascular and Mediastinum   Primary hypertension - Primary   At home patient is on amlodipine, chlorthalidone , Cozaar.  Holding these as she is n.p.o. IV metoprolol as needed for BP control.      Relevant  Medications   metoprolol tartrate (LOPRESSOR) injection 5 mg   hydrALAZINE (APRESOLINE) injection 10 mg   enoxaparin (LOVENOX) injection 120 mg  Digestive   * (Principal) Intractable nausea and vomiting   Patient has had intractable nausea and vomiting and persistent spitting and since the beginning of her prior admission on 12/01/2023.  She has been treated with Robinul , Phenergan , scopolamine  patch, and Zofran .  The patient was recently discharged on 12/23/2023 and reports since discharge she has had intractable nausea and vomiting.   Weight is down 10 kg since initial admission. Admit Start IV fluids GI consult today- called Continue scopolamine  patch, add scheduled Zofran , scheduled Reglan , as needed Compazine . IV Robinul  IV Protonix  Trend Hgb N.p.o. Daily weights Strict I's and O's Add LFTs and lipase to labs        Endocrine   Type 2 diabetes mellitus without complication, without long-term current use of insulin  (HCC)   Replete with IV runs of K x 4 Normal magnesium  level Trend      Relevant Medications   insulin  aspart (novoLOG) injection 0-20 Units   insulin  aspart (novoLOG) injection 0-5 Units     Genitourinary   AKI (acute kidney injury)   Avoid nephrotoxic agents IV fluid hydration Trend        Other   BMI 50.0-59.9, adult (HCC)   Relevant Medications   insulin  aspart (novoLOG) injection 0-20 Units   insulin  aspart (novoLOG) injection 0-5 Units   S/P exploratory laparotomy   Patient status post exploratory laparotomy on 12/08/2023 with an essentially frozen pelvis/abdomen and an inability to drain all of her abscesses.       Hypokalemia   Status post with IV runs of K x 4 Normal magnesium  level Trend      Open wound anterior abdominal wall   Secondary to exploratory laparotomy Wound VAC in place 3 times weekly changes Negative pressure wound therapy      Evalene CHRISTELLA Munch, MD [facility] 01/02/2024, 10:37 AM

## 2024-01-02 NOTE — Progress Notes (Signed)
 CSW attempted to meet with patient at bedside to address consult. Patient reported that she was sleeping and requested that CSW return at a later time. CSW agreed to check back later.   Misty Law, LCSW Clinical Social Worker [facility] Cell#: [phone]

## 2024-01-02 NOTE — Progress Notes (Signed)
 CSW attempted to meet with patient to address consult, patient was laying in the bed with her face covered. CSW greeted patient and attempted to engage. CSW asked was patient resting, patient reported yes. CSW asked if patient was rested enough to speak with CSW, patient reported no. CSW verbalized understanding.   CSW followed up with attending MD and provided an update. Attending MD agreed to place a psych consult as patient has not been open to engaging with CSW and continues to have ongoing mental health concerns.   CSW signing off and can follow up based on psychiatry recommendations.  Suzen Law, LCSW Clinical Social Worker [facility] Cell#: [phone]

## 2024-01-02 NOTE — Progress Notes (Signed)
 [facility]                                                                               Misty Walsh, is a 33 y.o. female, DOB - 31-Oct-1990, FMW:[id number] Admit date - 12/26/2023    Outpatient Primary MD for the patient is Buck Search, PA-C  LOS - 7  days    Brief summary    21F h/o HTN, HLD, DM2, PE and recent complicated course following pelvic abscesses and sepsis, status post exploratory laparotomy, multiple intra-abdominal fluid collections, some partially drained with refractory nausea vomiting c/b hypokalemia.  [facility] asked to consult on this patient.   CT chest, abdomen and pelvis showing Similar right adnexal abscess with percutaneous drain in place. Subcutaneous collection of fluid and air involving the right rectus abdominus muscle as has decreased slightly in size in the interval and is worrisome for an abscess.  Retro umbilical fluid collection is stable to minimally enlarged and may represent a seroma. Difficult to exclude abscess. Persistent moderate right hydronephrosis with decreased renal function, likely related to the aforementioned pelvic abscess.  Patient seen and examined, denies any new complaints. Reports feeling sleepy from pain meds. She continues to have nausea, but no vomiting.  Assessment & Plan   Intractable nausea and vomiting leading to persistent hypokalemia S/p EGD by gastroenterology.  Showing erythematous mucosa in the stomach likely reactive and related to vomiting and retching. Persistent nausea and vomiting probably from persistent abscesses seen on the repeat CT abdomen and pelvis. Patient is currently on Robinul  1 mg twice daily,  lorazepam 1 mg every 6 hours,  Reglan  10 mg every 6 hours,  Zofran  4 mg every 6 hours and prochlorperazine  10 mg every 6 hours as needed Scopolamine  patch 1 mg. In addition to the above, she was started on IV haldol.  She is also on Protonix  IV 40 mg twice daily She was started on  clears and advanced to regular diet.     Abdominal wall abscess IR consulted for drain and fluid to be sent for analysis. Fluid sent and pending.  ID consulted for new abscesses.     Pelvic abscesses s/p exp laparotomy  Right transgluteal drain on 10/29    Hypertension Blood pressure parameters are optimal.      Type 2 diabetes mellitus CBG (last 3)  Recent Labs    01/01/24 1306 01/01/24 2012 01/01/24 2206  GLUCAP 92 87 90   A1c 6.3. poor oral intake .   History of pulmonary embolism  on Lovenox injections as patient is not able to tolerate meds orally   AKI likely secondary to poor oral intake and patient was also found to have right moderate hydronephrosis from the pelvic abscesses Creatinine slowly improving.  Currently  slight up from yesterday to 1.3 from 1.2.  Suspect once again from poor oral intake.  She remains on IV fluids.    Hypokalemia and hypomagnesemia Replaced, r repeat levels wnl.  Keep magnesium  >2 and K>4.     Normocytic anemia/anemia of acute illness/anemia of blood loss Baseline hemoglobin around 12, dropped to 10.4 to 9. Anemia panel reviewed  Transfuse to keep it greater than 7  Depression Flat affect.  Does not get out of bed. Does not participate in therapy.  As per RN, patient urinates in the bed. Unclear when her last BM is.  Recommend getting psychiatry consult for further evaluation.  She once again denies any suicidal ideations.    Morbid obesity.  Body mass index is 48.68 kg/m. Recommend outpatient follow up with PCP for lifestyle changes, weight loss.    Medications  Scheduled Meds:  Chlorhexidine Gluconate Cloth  6 each Topical Daily   enoxaparin (LOVENOX) injection  120 mg Subcutaneous Q12H   feeding supplement  237 mL Oral TID BM   glycopyrrolate   0.2 mg Intravenous QID   hydrALAZINE  10 mg Intravenous Q6H   insulin  aspart  0-20 Units Subcutaneous TID WC   insulin  aspart  0-5 Units Subcutaneous QHS    lidocaine-EPINEPHrine  (PF)  20 mL Infiltration Once   LORazepam  1 mg Intravenous Q6H   metoCLOPramide  (REGLAN ) injection  10 mg Intravenous Q6H   multivitamin with minerals  1 tablet Oral Daily   ondansetron  (ZOFRAN ) IV  4 mg Intravenous Q6H   pantoprazole  (PROTONIX ) IV  40 mg Intravenous Q12H   scopolamine   1 patch Transdermal Q72H   Continuous Infusions:   PRN Meds:.bisacodyl, diphenhydrAMINE , guaiFENesin , haloperidol lactate, HYDROmorphone  (DILAUDID ) injection, menthol , metoprolol tartrate, polyethylene glycol, prochlorperazine , sodium chloride  flush    Subjective:   Misty Walsh was seen and examined today.   INITIALLY resisted to be examined, says can you please come back later.  But talked to her that she needs to get out of bed and eat, and participate in therapy.  She says no vomiting, but continues to spit up from nausea.  Explained in detail her mobilization will help with bowel movements and nausea.    Objective:   Vitals:   01/01/24 2030 01/01/24 2354 01/02/24 0919 01/02/24 1449  BP: (!) 151/83 (!) 148/73 (!) 146/77 (!) 159/82  Pulse: 92 96 92 (!) 101  Resp: 18 18 16    Temp: 98.4 F (36.9 C)  98.3 F (36.8 C)   TempSrc: Oral  Oral   SpO2: 93% 94% 95%   Height:        Intake/Output Summary (Last 24 hours) at 01/02/2024 1843 Last data filed at 01/02/2024 1400 Gross per 24 hour  Intake 821.33 ml  Output 195 ml  Net 626.33 ml   Filed Weights     Exam  General exam: Appears calm and comfortable , not in distress.  Respiratory system: diminished air entry.  Cardiovascular system: S1 & S2 heard, RRR. Gastrointestinal system: Abdomen is soft, abdominal drain, transgluteal drain, no tenderness bs minimal.  Central nervous system: Alert and oriented. Grossly non focal.  Extremities: pedal edema.  Skin: No rashes,  Psychiatry: flat affect. No suicidal ideations.     Data Reviewed:  I have personally reviewed following labs and imaging  studies   CBC Lab Results  Component Value Date   WBC 4.4 01/02/2024   RBC 2.84 (L) 01/02/2024   HGB 8.9 (L) 01/02/2024   HCT 27.6 (L) 01/02/2024   MCV 97.2 01/02/2024   MCH 31.3 01/02/2024   PLT 243 01/02/2024   MCHC 32.2 01/02/2024   RDW 14.2 01/02/2024   LYMPHSABS 1.4 01/02/2024   MONOABS 0.6 01/02/2024   EOSABS 0.2 01/02/2024   BASOSABS 0.0 01/02/2024     Last metabolic panel Lab Results  Component Value Date   NA 136 01/02/2024   K 3.6 01/02/2024   CL 101 01/02/2024  CO2 22 01/02/2024   BUN <5 (L) 01/02/2024   CREATININE 1.32 (H) 01/02/2024   GLUCOSE 85 01/02/2024   GFRNONAA 54 (L) 01/02/2024   GFRAA 127 11/03/2019   CALCIUM  8.5 (L) 01/02/2024   PHOS 2.7 12/10/2023   PROT 7.3 12/27/2023   ALBUMIN 3.2 (L) 12/27/2023   LABGLOB 2.7 11/20/2022   AGRATIO 2.0 05/25/2020   BILITOT 1.0 12/27/2023   ALKPHOS 54 12/27/2023   AST 27 12/27/2023   ALT 21 12/27/2023   ANIONGAP 13 01/02/2024    CBG (last 3)  Recent Labs    01/01/24 1306 01/01/24 2012 01/01/24 2206  GLUCAP 92 87 90      Coagulation Profile: No results for input(s): INR, PROTIME in the last 168 hours.   Radiology Studies: IR US  Guide Bx Asp/Drain Result Date: 01/02/2024 INDICATION: Abscess. Briefly, 33 year old female with parapelvic gaseous s/p drain placement and recent imaging with anterior abdominal wall fluid collection suspicious for subcutaneous abscesses. EXAM: US -GUIDED ASPIRATION OF ANTERIOR ABDOMINAL WALL SUBCUTANEOUS FLUID COLLECTION/ABSCESS X2 COMPARISON:  CT CAP, 12/29/2023. MEDICATIONS: The patient is currently admitted to the hospital and receiving intravenous antibiotics. The antibiotics were administered within an appropriate time frame prior to the initiation of the procedure. ANESTHESIA/SEDATION: Local anesthetic was administered. CONTRAST:  None COMPLICATIONS: None immediate. PROCEDURE: Informed written consent was obtained from the patient after a discussion of the risks,  benefits and alternatives to treatment. Preprocedural ultrasound scanning demonstrated two (2) anterior abdominal wall fluid collections; one midline and superficial at the umbilicus and the other deeper and RIGHT paraumbilical. A timeout was performed prior to the initiation of the procedure. The midline and RIGHT lower quadrant abdomen was prepped and draped in the usual sterile fashion. The overlying soft tissues were anesthetized with 1% lidocaine with epinephrine . Procedure began at the midline collection, and under direct ultrasound guidance, a 18 gauge trocar needle was advanced into the abscess/fluid collection. Next, approximately 10 mL of serosanguineous fluid was aspirated from the collection. The procedure was then repeated for the deeper, RIGHT paraumbilical fluid collection with aspiration yielding 1 mL of serosanguineous fluid. Multiple ultrasound images were saved for procedural documentation purposes. Representative samples of aspirated fluid was capped and sent to the laboratory for analysis. The needles were removed and superficial hemostasis was achieved with manual compression. A dressing was placed. The patient tolerated the procedure well without immediate postprocedural complication. IMPRESSION: Successful US -guided diagnostic aspiration of two (2) anterior abdominal wall fluid collections; midline at umbilicus and deeper, RIGHT paraumbilical. The collection yielded of 10 mL and 1 mL of serosanguineous fluid, respectively. Representative samples were submitted for microbiological analysis as requested by the clinical team. Thom Hall, MD [facility] Electronically Signed   By: Thom Hall M.D.   On: 01/02/2024 09:11   IR US  Guide Bx Asp/Drain Result Date: 01/02/2024 INDICATION: Abscess. Briefly, 33 year old female with parapelvic gaseous s/p drain placement and recent imaging with anterior abdominal wall fluid collection  suspicious for subcutaneous abscesses. EXAM: US -GUIDED ASPIRATION OF ANTERIOR ABDOMINAL WALL SUBCUTANEOUS FLUID COLLECTION/ABSCESS X2 COMPARISON:  CT CAP, 12/29/2023. MEDICATIONS: The patient is currently admitted to the hospital and receiving intravenous antibiotics. The antibiotics were administered within an appropriate time frame prior to the initiation of the procedure. ANESTHESIA/SEDATION: Local anesthetic was administered. CONTRAST:  None COMPLICATIONS: None immediate. PROCEDURE: Informed written consent was obtained from the patient after a discussion of the risks, benefits and alternatives to treatment. Preprocedural ultrasound scanning demonstrated two (2) anterior abdominal wall fluid collections;  one midline and superficial at the umbilicus and the other deeper and RIGHT paraumbilical. A timeout was performed prior to the initiation of the procedure. The midline and RIGHT lower quadrant abdomen was prepped and draped in the usual sterile fashion. The overlying soft tissues were anesthetized with 1% lidocaine with epinephrine . Procedure began at the midline collection, and under direct ultrasound guidance, a 18 gauge trocar needle was advanced into the abscess/fluid collection. Next, approximately 10 mL of serosanguineous fluid was aspirated from the collection. The procedure was then repeated for the deeper, RIGHT paraumbilical fluid collection with aspiration yielding 1 mL of serosanguineous fluid. Multiple ultrasound images were saved for procedural documentation purposes. Representative samples of aspirated fluid was capped and sent to the laboratory for analysis. The needles were removed and superficial hemostasis was achieved with manual compression. A dressing was placed. The patient tolerated the procedure well without immediate postprocedural complication. IMPRESSION: Successful US -guided diagnostic aspiration of two (2) anterior abdominal wall fluid collections; midline at umbilicus and deeper,  RIGHT paraumbilical. The collection yielded of 10 mL and 1 mL of serosanguineous fluid, respectively. Representative samples were submitted for microbiological analysis as requested by the clinical team. Thom Hall, MD [facility] Electronically Signed   By: Thom Hall M.D.   On: 01/02/2024 09:11       Elgie Butter M.D. [facility] 01/02/2024, 6:43 PM  Available via Epic secure chat 7am-7pm After 7 pm, please refer to night coverage provider listed on amion.

## 2024-01-02 NOTE — Progress Notes (Signed)
 PT Cancellation Note  Patient Details Name: Misty Walsh MRN: [id number] DOB: [birth year]   Cancelled Treatment:    Reason Eval/Treat Not Completed: Patient declined, no reason specified (pt refused, pulling covers over her head stating she is sleeping and refused all attempts for mobility)   Achaia Garlock B Kaisyn Reinhold 01/02/2024, 12:46 PM Lenoard SQUIBB, PT Acute Rehabilitation Services Office: [phone]

## 2024-01-02 NOTE — Progress Notes (Signed)
 RN notified CSW that patient requested to speak with CSW and noted that patient is upset that a psychiatry consult has been placed.   CSW met with patient at bedside. Patient was up and on the phone when CSW arrived. Patient opted to stay on the phone and granted CSW verbal permission to speak about anything while she was on the phone. CSW introduced self and explained reason consult. Patient expressed that she was upset that a psychiatry consult was placed and explained that she was drowsy due to her medications and was not able to engage with staff members that were entering her room. CSW acknowledged and normalized patient's feelings. Patient reported that she has been sad due to her medical condition and noted that she is not experiencing depression. 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 Patient explained that this has been a lot, being hospitalized and away from her daily routine. CSW acknowledged, normalized, and validated patient's feelings. CSW explained that medical team was just trying to offer support. CSW asked if patient was interested in mental health resources, patient reported no. Patient reported that she only has medical concerns, CSW agreed to relay medical concerns to patient's RN. Patient denied any further needs from CSW.   CSW updated RN of patient's medical concerns.  No further intervention indicated at this time.   Suzen Law, LCSW Clinical Social Worker [facility] Cell#: [phone]

## 2024-01-03 ENCOUNTER — Ambulatory Visit: Payer: Self-pay | Admitting: Gastroenterology

## 2024-01-03 LAB — CBC WITH DIFFERENTIAL/PLATELET
Abs Immature Granulocytes: 0.02 K/uL (ref 0.00–0.07)
Basophils Absolute: 0 K/uL (ref 0.0–0.1)
Basophils Relative: 0 %
Eosinophils Absolute: 0.2 K/uL (ref 0.0–0.5)
Eosinophils Relative: 4 %
HCT: 27.8 % — ABNORMAL LOW (ref 36.0–46.0)
Hemoglobin: 8.9 g/dL — ABNORMAL LOW (ref 12.0–15.0)
Immature Granulocytes: 0 %
Lymphocytes Relative: 27 %
Lymphs Abs: 1.2 K/uL (ref 0.7–4.0)
MCH: 31.4 pg (ref 26.0–34.0)
MCHC: 32 g/dL (ref 30.0–36.0)
MCV: 98.2 fL (ref 80.0–100.0)
Monocytes Absolute: 0.7 K/uL (ref 0.1–1.0)
Monocytes Relative: 15 %
Neutro Abs: 2.4 K/uL (ref 1.7–7.7)
Neutrophils Relative %: 54 %
Platelets: 224 K/uL (ref 150–400)
RBC: 2.83 MIL/uL — ABNORMAL LOW (ref 3.87–5.11)
RDW: 14.2 % (ref 11.5–15.5)
WBC: 4.6 K/uL (ref 4.0–10.5)
nRBC: 0 % (ref 0.0–0.2)

## 2024-01-03 NOTE — Evaluation (Signed)
 Physical Therapy Evaluation Patient Details Name: Misty Walsh MRN: [id number] DOB: 07/08/1990 Today's Date: 01/03/2024  History of Present Illness  33 yo F adm 12/26/23 with intractable N/V. 11/10 EGD. 11/13 IR abscess aspiration. PMhx:10/13-11/4 admission for pelvic abscess s/p ex lap 12/08/23. 10/22 PE and bil DVT. 10/29 Drain placed by IR. T2DM, morbid obesity, HTN  Clinical Impression  Pt admitted with above diagnosis. PTA pt lived in first floor apartment with special needs son, I/mod I. She discharged home from [facility] 11/4 with bariatric RW. Pt currently with functional limitations due to the deficits listed below (see PT Problem List). On eval, pt demo mod I bed mobility. Supervision transfers and CGA amb 175' with RW. SpO2 91% on RA. Max HR 103. Pt needs encouragement to amb to hallway daily. Pt will benefit from acute skilled PT to increase their independence and safety with mobility to allow discharge. PT to follow acutely. No follow up services indicated. No DME needs.         If plan is discharge home, recommend the following: Assistance with cooking/housework;Assist for transportation   Can travel by private vehicle        Equipment Recommendations None recommended by PT  Recommendations for Other Services       Functional Status Assessment Patient has had a recent decline in their functional status and demonstrates the ability to make significant improvements in function in a reasonable and predictable amount of time.     Precautions / Restrictions Precautions Precautions: Other (comment) Precaution/Restrictions Comments: abd drain      Mobility  Bed Mobility Overal bed mobility: Modified Independent             General bed mobility comments: increased time, use of bed functions    Transfers Overall transfer level: Needs assistance Equipment used: Rolling walker (2 wheels) Transfers: Sit to/from Stand Sit to Stand: Supervision           General  transfer comment: increased time    Ambulation/Gait Ambulation/Gait assistance: Contact guard assist Gait Distance (Feet): 175 Feet Assistive device: Rolling walker (2 wheels) Gait Pattern/deviations: Step-through pattern, Wide base of support, Decreased stride length Gait velocity: decreased Gait velocity interpretation: <1.31 ft/sec, indicative of household ambulator   General Gait Details: 1 instance of unsteadiness but pt able to self correct. SpO2 91% on RA. Max HR 103.  Stairs            Wheelchair Mobility     Tilt Bed    Modified Rankin (Stroke Patients Only)       Balance Overall balance assessment: Mild deficits observed, not formally tested                                           Pertinent Vitals/Pain Pain Assessment Pain Assessment: Faces Faces Pain Scale: Hurts little more Pain Location: abdomen Pain Descriptors / Indicators: Grimacing, Guarding, Sore Pain Intervention(s): Monitored during session    Home Living Family/patient expects to be discharged to:: Private residence Living Arrangements: Children   Type of Home: Apartment Home Access: Level entry       Home Layout: One level Home Equipment: Agricultural Consultant (2 wheels)      Prior Function Prior Level of Function : Independent/Modified Independent                     Extremity/Trunk Assessment   Upper  Extremity Assessment Upper Extremity Assessment: Overall WFL for tasks assessed    Lower Extremity Assessment Lower Extremity Assessment: Generalized weakness    Cervical / Trunk Assessment Cervical / Trunk Assessment: Other exceptions Cervical / Trunk Exceptions: body habitus  Communication   Communication Communication: No apparent difficulties    Cognition Arousal: Alert Behavior During Therapy: Flat affect   PT - Cognitive impairments: No apparent impairments                         Following commands: Intact       Cueing  Cueing Techniques: Verbal cues     General Comments General comments (skin integrity, edema, etc.): VSS on RA    Exercises     Assessment/Plan    PT Assessment Patient needs continued PT services  PT Problem List Decreased strength;Decreased activity tolerance;Decreased mobility;Obesity;Pain       PT Treatment Interventions Gait training;Therapeutic exercise;Functional mobility training;Therapeutic activities;Patient/family education;DME instruction    PT Goals (Current goals can be found in the Care Plan section)  Acute Rehab PT Goals Patient Stated Goal: independence PT Goal Formulation: With patient Time For Goal Achievement: 01/17/24 Potential to Achieve Goals: Good    Frequency Min 1X/week     Co-evaluation               AM-PAC PT 6 Clicks Mobility  Outcome Measure Help needed turning from your back to your side while in a flat bed without using bedrails?: None Help needed moving from lying on your back to sitting on the side of a flat bed without using bedrails?: None Help needed moving to and from a bed to a chair (including a wheelchair)?: A Little Help needed standing up from a chair using your arms (e.g., wheelchair or bedside chair)?: A Little Help needed to walk in hospital room?: A Little Help needed climbing 3-5 steps with a railing? : A Little 6 Click Score: 20    End of Session   Activity Tolerance: Patient tolerated treatment well Patient left: in bed;with call bell/phone within reach Nurse Communication: Mobility status PT Visit Diagnosis: Muscle weakness (generalized) (M62.81);Difficulty in walking, not elsewhere classified (R26.2);Pain    Time: 8951-8896 PT Time Calculation (min) (ACUTE ONLY): 15 min   Charges:   PT Evaluation $PT Eval Low Complexity: 1 Low   PT General Charges $$ ACUTE PT VISIT: 1 Visit         Sari MATSU., PT  Office # [phone]   Erven Sari Shaker 01/03/2024, 11:20 AM

## 2024-01-03 NOTE — Progress Notes (Signed)
 Referring Provider(s): Dr. Bebe Furry, MD   Supervising Physician: Hughes Simmonds   Patient Status:  [facility] - In-pt   Chief Complaint: Pelvic abscess; s/p pelvic drain placement 10/29 with Dr. Vanice, and abdominal wall fluid collection s/p aspiration on 11/13 by Dr. Hughes.   Subjective:   Patient alert and sitting up in bed, calm.  Patient is complaining of soreness at and around drain insertion site. She is weary of the drain being in place. Patient denies any fevers, chest pain, SOB, cough, or bleeding.   Allergies: Macrobid  [nitrofurantoin  macrocrystal]  Medications: Prior to Admission medications   Medication Sig Start Date End Date Taking? Authorizing Provider  amLODipine (NORVASC) 5 MG tablet Take 1 tablet (5 mg total) by mouth daily. 12/14/23   Zina Jerilynn LABOR, MD  apixaban (ELIQUIS) 5 MG TABS tablet Take 2 tablets by mouth twice a day for 3 doses then start 1 tablet twice a day thereafter 12/23/23   Arnold, James G, MD  atorvastatin (LIPITOR) 80 MG tablet Take 1 tablet (80 mg total) by mouth daily. 12/14/23   Ozan, Jennifer, DO  chlorthalidone  (HYGROTON ) 25 MG tablet Take 1 tablet (25 mg total) by mouth daily. 12/14/23   Zina Jerilynn LABOR, MD  glycopyrrolate  (ROBINUL ) 1 MG tablet Take 1 tablet (1 mg total) by mouth 3 (three) times daily. 12/14/23   Zina Jerilynn LABOR, MD  losartan (COZAAR) 50 MG tablet Take 1 tablet (50 mg total) by mouth daily. 12/14/23   Zina Jerilynn LABOR, MD  metFORMIN (GLUCOPHAGE) 500 MG tablet Take 1 tablet (500 mg total) by mouth 2 (two) times daily with a meal. 12/23/23   Jayne Vonn DEL, MD  metoCLOPramide  (REGLAN ) 10 MG tablet Take 1 tablet (10 mg total) by mouth every 8 (eight) hours for 14 days. 12/23/23 01/06/24  Jayne Vonn DEL, MD  ondansetron  (ZOFRAN ) 4 MG tablet Take 1 tablet (4 mg total) by mouth every 6 (six) hours as needed for nausea or vomiting. 12/14/23   Zina Jerilynn LABOR, MD  promethazine  (PHENERGAN ) 25 MG tablet Take 1 tablet (25 mg total)  by mouth every 6 (six) hours as needed for refractory nausea / vomiting. 12/13/23   Ozan, Jennifer, DO  scopolamine  (TRANSDERM-SCOP) 1 MG/3DAYS Place 1 patch (1 mg total) onto the skin every 3 (three) days. 12/15/23   Zina Jerilynn LABOR, MD  sodium chloride  flush (NS) 0.9 % SOLN Place 10 mLs into feeding tube daily AND flush drain once daily with 5 mL saline from prefilled syringe. 12/23/23 02/21/24  Jayne Vonn DEL, MD     Vital Signs: BP (!) 148/78 (BP Location: Left Wrist)   Pulse 94   Temp 98.2 F (36.8 C) (Oral)   Resp 19   Ht 5' 4 (1.626 m)   Wt 283 lb 9.6 oz (128.6 kg)   LMP 11/18/2023 (Exact Date)   SpO2 94%   BMI 48.68 kg/m   Physical Exam Constitutional:      Appearance: Normal appearance.  Cardiovascular:     Rate and Rhythm: Normal rate.  Pulmonary:     Effort: Pulmonary effort is normal.  Musculoskeletal:        General: Normal range of motion.  Skin:    General: Skin is warm and dry.     Comments: Right transgluteal drain appropriately dressed. Dressing is has evidence of some minor bleeding, but remains dry, intact. RN changing dressing at bedside. Incision site remains mildly tender, as expected from recent placement, without evidence of infection. Retaining  suture and Stat Lock in place.  5 cc serosanguinous output in collection bulb tubing. Line flushes well.    Neurological:     Mental Status: She is alert and oriented to person, place, and time.      Labs:  CBC: Recent Labs    12/31/23 0446 01/01/24 0513 01/02/24 0613 01/03/24 0823  WBC 5.1 4.9 4.4 4.6  HGB 9.0* 9.1* 8.9* 8.9*  HCT 27.7* 28.0* 27.6* 27.8*  PLT 256 253 243 224    COAGS: Recent Labs    12/05/23 0730 12/08/23 0436 12/15/23 1428 12/16/23 0849 12/17/23 0538 12/17/23 2117 12/18/23 0451  INR 1.2 1.2  --  1.3* 1.2  --   --   APTT  --   --    < > 85* 60* 79* 69*   < > = values in this interval not displayed.    BMP: Recent Labs    12/30/23 0425 12/31/23 0446  01/01/24 0513 01/02/24 0613  NA 137 136 134* 136  K 3.2* 3.1* 3.2* 3.6  CL 99 99 99 101  CO2 22 25 25 22   GLUCOSE 90 84 91 85  BUN 6 6 5* <5*  CALCIUM  8.6* 8.2* 8.2* 8.5*  CREATININE 1.41* 1.27* 1.27* 1.32*  GFRNONAA 50* 57* 57* 54*    LIVER FUNCTION TESTS: Recent Labs    12/13/23 0415 12/21/23 0423 12/26/23 1842 12/27/23 0917  BILITOT 0.5 0.2 1.0 1.0  AST 41 28 36 27  ALT 24 19 21 21   ALKPHOS 48 44 58 54  PROT 6.3* 6.5 7.9 7.3  ALBUMIN 2.3* 2.4* 3.5 3.2*    Assessment and Plan:  Pelvic abscess; s/p pelvic drain placement 10/29 with Dr. Vanice, and abdominal wall fluid collection s/p aspiration on 11/13 by Dr. Hughes.   Drain Location: right transgluteal pelvic drain  Size: Fr size: 10 Fr Date of placement: 12/17/23  Currently to: Drain collection device: suction bulb 24 hour output:  Output by Drain (mL) 01/01/24 0701 - 01/01/24 1900 01/01/24 1901 - 01/02/24 0700 01/02/24 0701 - 01/02/24 1900 01/02/24 1901 - 01/03/24 0700 01/03/24 0701 - 01/03/24 1408  Closed System Drain 3 Inferior;Right Back Bulb (JP) 10 Fr.  15 5 5      Interval imaging/drain manipulation:  None.  Current examination: Flushes/aspirates easily.  Insertion site unremarkable. Suture and stat lock in place. Dressed appropriately.   Plan: Continue TID flushes with 5 cc NS. Record output Q shift. Dressing changes QD or PRN if soiled.  Call IR APP or on call IR MD if difficulty flushing or sudden change in drain output.  Repeat imaging/possible drain injection once output < 10 mL/QD (excluding flush material). Consideration for drain removal if output is < 10 mL/QD (excluding flush material), pending discussion with the providing surgical service.  Discharge planning: Please contact IR APP or on call IR MD prior to patient d/c to ensure appropriate follow up plans are in place. Typically patient will follow up with IR clinic 10-14 days post d/c for repeat imaging/possible drain injection. IR  scheduler will contact patient with date/time of appointment. Patient will need to flush drain QD with 5 cc NS, record output QD, dressing changes every 2-3 days or earlier if soiled.   IR will continue to follow - please call with questions or concerns.      Thank you for this interesting consult.  I greatly enjoyed meeting Misty Walsh and look forward to participating in their care.   Electronically Signed: Carlin DELENA Griffon, PA-C  01/03/2024, 1:59 PM     I spent a total of 15 Minutes at the the patient's bedside AND on the patient's hospital floor or unit, greater than 50% of which was counseling/coordinating care for right transgluteal pelvic drain follow-up.

## 2024-01-03 NOTE — Progress Notes (Signed)
 [facility] OB/GYN Attending Progress Note  ADMISSION DIAGNOSIS:   Principal Problem:   Intractable nausea and vomiting Active Problems:   Hyperlipemia   Primary hypertension   Type 2 diabetes mellitus without complication, without long-term current use of insulin  (HCC)   Tubo-ovarian abscess   AKI (acute kidney injury)   S/P exploratory laparotomy   Pulmonary embolism (HCC)   Hypokalemia   Open wound anterior abdominal wall   Gastric erythema   Subjective  Patient sleeping in bed, reports no acute complaints.  Patient reports vomiting through the evening; however, when spoke with nurse, patient notes spitting only.  She denies fevers or chills.  She has tolerated some Ensure but does not like how sweet it is.  She notes passing flatus and having regular BMs.     Objective  VITALS:  height is 5' 4 (1.626 m) and weight is 128.6 kg. Her oral temperature is 98.2 F (36.8 C). Her blood pressure is 148/78 (abnormal) and her pulse is 94. Her respiration is 19 and oxygen saturation is 94%.   EXAMINATION: CONSTITUTIONAL: chronically ill-appearing  NECK: Normal range of motion, supple, no masses SKIN: Skin is warm and dry. No rash noted. Not diaphoretic. No erythema. No pallor. NEUROLOGIC: Alert and oriented to person, place, and time.  PSYCHIATRIC: Normal mood and flat affect CARDIOVASCULAR: Normal heart rate noted RESPIRATORY: Effort and breath sounds normal, no problems with respiration noted ABDOMEN: obese, Soft, nontender, nondistended, +BS.  Incision: dressing intact- appears clean and dry  Laboratory Reports: Results for orders placed or performed during the hospital encounter of 12/26/23 (from the past 72 hours)  Glucose, capillary     Status: None   Collection Time: 12/31/23 12:47 PM  Result Value Ref Range   Glucose-Capillary 88 70 - 99 mg/dL    Comment: Glucose reference range applies only to samples taken after fasting for at least 8  hours.  Glucose, capillary     Status: None   Collection Time: 12/31/23  5:06 PM  Result Value Ref Range   Glucose-Capillary 92 70 - 99 mg/dL    Comment: Glucose reference range applies only to samples taken after fasting for at least 8 hours.  Glucose, capillary     Status: None   Collection Time: 12/31/23  8:18 PM  Result Value Ref Range   Glucose-Capillary 92 70 - 99 mg/dL    Comment: Glucose reference range applies only to samples taken after fasting for at least 8 hours.  Glucose, capillary     Status: None   Collection Time: 01/01/24 12:48 AM  Result Value Ref Range   Glucose-Capillary 94 70 - 99 mg/dL    Comment: Glucose reference range applies only to samples taken after fasting for at least 8 hours.  Glucose, capillary     Status: None   Collection Time: 01/01/24  3:51 AM  Result Value Ref Range   Glucose-Capillary 94 70 - 99 mg/dL    Comment: Glucose reference range applies only to samples taken after fasting for at least 8 hours.  Basic metabolic panel     Status: Abnormal   Collection Time: 01/01/24  5:13 AM  Result Value Ref Range   Sodium 134 (L) 135 - 145 mmol/L   Potassium 3.2 (L) 3.5 - 5.1 mmol/L   Chloride 99 98 - 111 mmol/L   CO2 25 22 - 32 mmol/L   Glucose, Bld 91 70 - 99 mg/dL  Comment: Glucose reference range applies only to samples taken after fasting for at least 8 hours.   BUN 5 (L) 6 - 20 mg/dL   Creatinine, Ser 8.72 (H) 0.44 - 1.00 mg/dL   Calcium  8.2 (L) 8.9 - 10.3 mg/dL   GFR, Estimated 57 (L) >60 mL/min    Comment: (NOTE) Calculated using the CKD-EPI Creatinine Equation (2021)    Anion gap 10 5 - 15    Comment: Performed at [facility], [address] [place], [place] [address]  Magnesium      Status: None   Collection Time: 01/01/24  5:13 AM  Result Value Ref Range   Magnesium  2.1 1.7 - 2.4 mg/dL    Comment: Performed at [facility], [address], [place], [place] [address]  CBC with Differential/Platelet     Status:  Abnormal   Collection Time: 01/01/24  5:13 AM  Result Value Ref Range   WBC 4.9 4.0 - 10.5 K/uL   RBC 2.90 (L) 3.87 - 5.11 MIL/uL   Hemoglobin 9.1 (L) 12.0 - 15.0 g/dL   HCT 71.9 (L) 63.9 - 53.9 %   MCV 96.6 80.0 - 100.0 fL   MCH 31.4 26.0 - 34.0 pg   MCHC 32.5 30.0 - 36.0 g/dL   RDW 85.8 88.4 - 84.4 %   Platelets 253 150 - 400 K/uL   nRBC 0.0 0.0 - 0.2 %   Neutrophils Relative % 51 %   Neutro Abs 2.5 1.7 - 7.7 K/uL   Lymphocytes Relative 28 %   Lymphs Abs 1.4 0.7 - 4.0 K/uL   Monocytes Relative 15 %   Monocytes Absolute 0.7 0.1 - 1.0 K/uL   Eosinophils Relative 5 %   Eosinophils Absolute 0.2 0.0 - 0.5 K/uL   Basophils Relative 1 %   Basophils Absolute 0.0 0.0 - 0.1 K/uL   Immature Granulocytes 0 %   Abs Immature Granulocytes 0.02 0.00 - 0.07 K/uL    Comment: Performed at [facility], [address], [place], [place] [address]  Glucose, capillary     Status: None   Collection Time: 01/01/24  8:23 AM  Result Value Ref Range   Glucose-Capillary 93 70 - 99 mg/dL    Comment: Glucose reference range applies only to samples taken after fasting for at least 8 hours.  Aerobic/Anaerobic Culture w Gram Stain (surgical/deep wound)     Status: None (Preliminary result)   Collection Time: 01/01/24  9:49 AM   Specimen: Abscess  Result Value Ref Range   Specimen Description ABSCESS    Special Requests NONE    Gram Stain      FEW WBC PRESENT, PREDOMINANTLY PMN NO ORGANISMS SEEN    Culture      NO GROWTH 2 DAYS Performed at [facility], [address] [place], [place] [address]    Report Status PENDING   Aerobic/Anaerobic Culture w Gram Stain (surgical/deep wound)     Status: None (Preliminary result)   Collection Time: 01/01/24  9:51 AM   Specimen: Abscess  Result Value Ref Range   Specimen Description ABSCESS    Special Requests NONE    Gram Stain      ABUNDANT WBC PRESENT, PREDOMINANTLY PMN NO ORGANISMS SEEN    Culture      NO GROWTH 2 DAYS Performed at  [facility], [address], [place], [place] [address]    Report Status PENDING   Glucose, capillary     Status:  None   Collection Time: 01/01/24  1:06 PM  Result Value Ref Range   Glucose-Capillary 92 70 - 99 mg/dL    Comment: Glucose reference range applies only to samples taken after fasting for at least 8 hours.  Glucose, capillary     Status: None   Collection Time: 01/01/24  8:12 PM  Result Value Ref Range   Glucose-Capillary 87 70 - 99 mg/dL    Comment: Glucose reference range applies only to samples taken after fasting for at least 8 hours.  Glucose, capillary     Status: None   Collection Time: 01/01/24 10:06 PM  Result Value Ref Range   Glucose-Capillary 90 70 - 99 mg/dL    Comment: Glucose reference range applies only to samples taken after fasting for at least 8 hours.  CBC with Differential/Platelet     Status: Abnormal   Collection Time: 01/02/24  6:13 AM  Result Value Ref Range   WBC 4.4 4.0 - 10.5 K/uL   RBC 2.84 (L) 3.87 - 5.11 MIL/uL   Hemoglobin 8.9 (L) 12.0 - 15.0 g/dL   HCT 72.3 (L) 63.9 - 53.9 %   MCV 97.2 80.0 - 100.0 fL   MCH 31.3 26.0 - 34.0 pg   MCHC 32.2 30.0 - 36.0 g/dL   RDW 85.7 88.4 - 84.4 %   Platelets 243 150 - 400 K/uL   nRBC 0.0 0.0 - 0.2 %   Neutrophils Relative % 50 %   Neutro Abs 2.2 1.7 - 7.7 K/uL   Lymphocytes Relative 31 %   Lymphs Abs 1.4 0.7 - 4.0 K/uL   Monocytes Relative 13 %   Monocytes Absolute 0.6 0.1 - 1.0 K/uL   Eosinophils Relative 4 %   Eosinophils Absolute 0.2 0.0 - 0.5 K/uL   Basophils Relative 1 %   Basophils Absolute 0.0 0.0 - 0.1 K/uL   Immature Granulocytes 1 %   Abs Immature Granulocytes 0.02 0.00 - 0.07 K/uL    Comment: Performed at [facility], [address], [place], [place] [address]  Basic metabolic panel     Status: Abnormal   Collection Time: 01/02/24  6:13 AM  Result Value Ref Range   Sodium 136 135 - 145 mmol/L   Potassium 3.6 3.5 - 5.1 mmol/L   Chloride 101 98 - 111 mmol/L   CO2 22  22 - 32 mmol/L   Glucose, Bld 85 70 - 99 mg/dL    Comment: Glucose reference range applies only to samples taken after fasting for at least 8 hours.   BUN <5 (L) 6 - 20 mg/dL   Creatinine, Ser 8.67 (H) 0.44 - 1.00 mg/dL   Calcium  8.5 (L) 8.9 - 10.3 mg/dL   GFR, Estimated 54 (L) >60 mL/min    Comment: (NOTE) Calculated using the CKD-EPI Creatinine Equation (2021)    Anion gap 13 5 - 15    Comment: Performed at [facility], [address], [place], [place] [address]  Magnesium      Status: None   Collection Time: 01/02/24  6:13 AM  Result Value Ref Range   Magnesium  1.8 1.7 - 2.4 mg/dL    Comment: Performed at [facility], [address], [place], [place] [address]  CBC with Differential/Platelet     Status: Abnormal   Collection Time: 01/03/24  8:23 AM  Result Value Ref Range   WBC 4.6 4.0 - 10.5 K/uL   RBC 2.83 (L) 3.87 - 5.11 MIL/uL   Hemoglobin  8.9 (L) 12.0 - 15.0 g/dL   HCT 72.1 (L) 63.9 - 53.9 %   MCV 98.2 80.0 - 100.0 fL   MCH 31.4 26.0 - 34.0 pg   MCHC 32.0 30.0 - 36.0 g/dL   RDW 85.7 88.4 - 84.4 %   Platelets 224 150 - 400 K/uL   nRBC 0.0 0.0 - 0.2 %   Neutrophils Relative % 54 %   Neutro Abs 2.4 1.7 - 7.7 K/uL   Lymphocytes Relative 27 %   Lymphs Abs 1.2 0.7 - 4.0 K/uL   Monocytes Relative 15 %   Monocytes Absolute 0.7 0.1 - 1.0 K/uL   Eosinophils Relative 4 %   Eosinophils Absolute 0.2 0.0 - 0.5 K/uL   Basophils Relative 0 %   Basophils Absolute 0.0 0.0 - 0.1 K/uL   Immature Granulocytes 0 %   Abs Immature Granulocytes 0.02 0.00 - 0.07 K/uL    Comment: Performed at [facility], [address] [place], [place] [address]     ASSESSMENT/PLAN: 1) GI-intractable nausea vomiting - Etiology remains unclear, suspect her vomiting is actually spitting - Status post GI workup including EGD, no abnormalities noted - Continue IV Robinul  and Haldol as needed.  IV antiemetics still on board - Encouraged patient trial of regular diet today  2) GYN-  postop ex-Lap with abscess drainage (10/20) - WOC consulted and dressing changes weekly, next to be done 11/17 - Meeting postoperative milestones appropriately   3) ID -s/p IR drainage on 10/29 and additional drainage of subQ collection on 11/13  -afebrile, no leukocytosis noted -will continue to trend -s/p ID consult  4) Heme -on Lovenox until able to tolerate po  5) HTN and type 2 DM -appreciate management per TRH   DISPO: encouraged pt to advance diet and work on ambulation.  Continue supportive care as above   LOS: 8 days   Ricardo Kayes, DO Attending Obstetrician & Gynecologist, [facility] for [employer], [facility]

## 2024-01-03 NOTE — Progress Notes (Signed)
 [facility]                                                                               Misty Walsh, is a 33 y.o. female, DOB - 08/24/1990, FMW:[id number] Admit date - 12/26/2023    Outpatient Primary MD for the patient is Misty Search, PA-C  LOS - 8  days    Brief summary    8F h/o HTN, HLD, DM2, PE and recent complicated course following pelvic abscesses and sepsis, status post exploratory laparotomy, multiple intra-abdominal fluid collections, some partially drained with refractory nausea vomiting c/b hypokalemia.  [facility] asked to consult on this patient.   CT chest, abdomen and pelvis showing Similar right adnexal abscess with percutaneous drain in place. Subcutaneous collection of fluid and air involving the right rectus abdominus muscle as has decreased slightly in size in the interval and is worrisome for an abscess.  Retro umbilical fluid collection is stable to minimally enlarged and may represent a seroma. Difficult to exclude abscess. Persistent moderate right hydronephrosis with decreased renal function, likely related to the aforementioned pelvic abscess.  Patient seen and examined today, she is up and sitting on the side of the bed.  Discussed with RN, patient has ambulated with PT today and made progress. She als ordered solid food for lunch and dinner.   Assessment & Plan   Intractable nausea and vomiting leading to persistent hypokalemia Slowly improving. She is more alert and answering questions appropriately.  S/p EGD by gastroenterology.  Showing erythematous mucosa in the stomach likely reactive and related to vomiting and retching. Persistent nausea and vomiting probably from persistent abscesses seen on the repeat CT abdomen and pelvis. Patient is currently on Robinul , lorazepam 1 mg every 6 hours,  Reglan  10 mg every 6 hours,  Zofran  4 mg every 6 hours and prochlorperazine  10 mg every 6 hours as needed Scopolamine  patch 1  mg. She is also on Protonix  IV 40 mg twice daily She was started on clears and advanced to regular diet.  Recommend changing the anti emetics to prn as her nausea has improved. No vomiting noted.    Abdominal wall abscess IR consulted for drain and fluid to be sent for analysis. Fluid sent and pending.  ID consulted for new abscesses, since cultures have been negative, will watch her off antibiotics.     Pelvic abscesses  s/p pelvic drain placement 10/29 with Dr. Vanice, and abdominal wall fluid collection s/p aspiration on 11/13 by Dr. Hughes.  Right transgluteal drain on 10/29,.  S/p exp laparotomy on 10/20, abdominal wall wound connected wound VAC.    Hypertension Blood pressure parameters are well controlled.       Type 2 diabetes mellitus CBG (last 3)  Recent Labs    01/01/24 1306 01/01/24 2012 01/01/24 2206  GLUCAP 92 87 90   A1c 6.3.  on SSI. [name]   History of pulmonary embolism  on Lovenox injections as patient is not able to tolerate meds orally Once she is able to tolerate oral, transition to oral meds.    AKI likely secondary to poor oral intake and patient was also found to have right moderate hydronephrosis from  the pelvic abscesses Creatinine slowly improving.  Currently  slight up from yesterday to 1.3 from 1.2.  Suspect once again from poor oral intake.  She remains on IV fluids.  Repeat renal parameters in am.    Hypokalemia and hypomagnesemia Replaced, r repeat levels wnl.  Keep magnesium  >2 and K>4.     Normocytic anemia/anemia of acute illness/anemia of blood loss Baseline hemoglobin around 12, dropped to 10.4 to 9 . Currently stable around 8.9 Anemia panel reviewed  Transfuse to keep it greater than 7   Depression Flat affect.  She is more alert and conversing , sitting up in bed, already made 2 rounds in the hallway.  Psychiatry consulted by primary.  She denies any suicidal ideation.    Morbid obesity.  Body mass index is 48.68  kg/m. Recommend outpatient follow up with PCP for lifestyle changes, weight loss.    Medications  Scheduled Meds:  Chlorhexidine Gluconate Cloth  6 each Topical Daily   enoxaparin (LOVENOX) injection  120 mg Subcutaneous Q12H   feeding supplement  237 mL Oral TID BM   glycopyrrolate   0.2 mg Intravenous QID   hydrALAZINE  10 mg Intravenous Q6H   insulin  aspart  0-20 Units Subcutaneous TID WC   insulin  aspart  0-5 Units Subcutaneous QHS   lidocaine-EPINEPHrine  (PF)  20 mL Infiltration Once   LORazepam  1 mg Intravenous Q6H   metoCLOPramide  (REGLAN ) injection  10 mg Intravenous Q6H   multivitamin with minerals  1 tablet Oral Daily   ondansetron  (ZOFRAN ) IV  4 mg Intravenous Q6H   pantoprazole  (PROTONIX ) IV  40 mg Intravenous Q12H   scopolamine   1 patch Transdermal Q72H   Continuous Infusions:   PRN Meds:.bisacodyl, diphenhydrAMINE , guaiFENesin , haloperidol lactate, HYDROmorphone  (DILAUDID ) injection, menthol , metoprolol tartrate, polyethylene glycol, prochlorperazine , sodium chloride  flush    Subjective:   Misty Walsh was seen and examined today.   No chest pain or sob. Abd pain has improved.   Objective:   Vitals:   01/02/24 2335 01/03/24 0312 01/03/24 0720 01/03/24 1126  BP: 128/69 (!) 147/84 (!) 159/84 (!) 148/78  Pulse: (!) 105 98 96 94  Resp: 18 17 19 19   Temp: 99 F (37.2 C) 98.1 F (36.7 C) 99 F (37.2 C) 98.2 F (36.8 C)  TempSrc: Oral Oral Oral Oral  SpO2: 95% 95% 95% 94%  Height:        Intake/Output Summary (Last 24 hours) at 01/03/2024 1525 Last data filed at 01/03/2024 0550 Gross per 24 hour  Intake --  Output 55 ml  Net -55 ml   Filed Weights     Exam  General exam: Appears calm and comfortable  Respiratory system: Clear to auscultation. Respiratory effort normal. Cardiovascular system: S1 & S2 heard, RRR.  Gastrointestinal system: Abdomen is soft abd wound connected to wound vac.  Central nervous system: Alert and oriented.      Data Reviewed:  I have personally reviewed following labs and imaging studies   CBC Lab Results  Component Value Date   WBC 4.6 01/03/2024   RBC 2.83 (L) 01/03/2024   HGB 8.9 (L) 01/03/2024   HCT 27.8 (L) 01/03/2024   MCV 98.2 01/03/2024   MCH 31.4 01/03/2024   PLT 224 01/03/2024   MCHC 32.0 01/03/2024   RDW 14.2 01/03/2024   LYMPHSABS 1.2 01/03/2024   MONOABS 0.7 01/03/2024   EOSABS 0.2 01/03/2024   BASOSABS 0.0 01/03/2024     Last metabolic panel Lab Results  Component Value Date  NA 136 01/02/2024   K 3.6 01/02/2024   CL 101 01/02/2024   CO2 22 01/02/2024   BUN <5 (L) 01/02/2024   CREATININE 1.32 (H) 01/02/2024   GLUCOSE 85 01/02/2024   GFRNONAA 54 (L) 01/02/2024   GFRAA 127 11/03/2019   CALCIUM  8.5 (L) 01/02/2024   PHOS 2.7 12/10/2023   PROT 7.3 12/27/2023   ALBUMIN 3.2 (L) 12/27/2023   LABGLOB 2.7 11/20/2022   AGRATIO 2.0 05/25/2020   BILITOT 1.0 12/27/2023   ALKPHOS 54 12/27/2023   AST 27 12/27/2023   ALT 21 12/27/2023   ANIONGAP 13 01/02/2024    CBG (last 3)  Recent Labs    01/01/24 1306 01/01/24 2012 01/01/24 2206  GLUCAP 92 87 90      Coagulation Profile: No results for input(s): INR, PROTIME in the last 168 hours.   Radiology Studies: No results found.      Elgie Butter M.D. [facility] 01/03/2024, 3:25 PM  Available via Epic secure chat 7am-7pm After 7 pm, please refer to night coverage provider listed on amion.

## 2024-01-03 NOTE — Plan of Care (Signed)

## 2024-01-04 LAB — CBC WITH DIFFERENTIAL/PLATELET
Abs Immature Granulocytes: 0.02 K/uL (ref 0.00–0.07)
Basophils Absolute: 0 K/uL (ref 0.0–0.1)
Basophils Relative: 1 %
Eosinophils Absolute: 0.2 K/uL (ref 0.0–0.5)
Eosinophils Relative: 5 %
HCT: 27.2 % — ABNORMAL LOW (ref 36.0–46.0)
Hemoglobin: 8.7 g/dL — ABNORMAL LOW (ref 12.0–15.0)
Immature Granulocytes: 0 %
Lymphocytes Relative: 31 %
Lymphs Abs: 1.5 K/uL (ref 0.7–4.0)
MCH: 31.2 pg (ref 26.0–34.0)
MCHC: 32 g/dL (ref 30.0–36.0)
MCV: 97.5 fL (ref 80.0–100.0)
Monocytes Absolute: 0.7 K/uL (ref 0.1–1.0)
Monocytes Relative: 14 %
Neutro Abs: 2.4 K/uL (ref 1.7–7.7)
Neutrophils Relative %: 49 %
Platelets: 223 K/uL (ref 150–400)
RBC: 2.79 MIL/uL — ABNORMAL LOW (ref 3.87–5.11)
RDW: 14 % (ref 11.5–15.5)
WBC: 4.8 K/uL (ref 4.0–10.5)
nRBC: 0 % (ref 0.0–0.2)

## 2024-01-04 LAB — BASIC METABOLIC PANEL WITH GFR
Anion gap: 13 (ref 5–15)
BUN: 5 mg/dL — ABNORMAL LOW (ref 6–20)
CO2: 24 mmol/L (ref 22–32)
Calcium: 8.5 mg/dL — ABNORMAL LOW (ref 8.9–10.3)
Chloride: 98 mmol/L (ref 98–111)
Creatinine, Ser: 1.36 mg/dL — ABNORMAL HIGH (ref 0.44–1.00)
GFR, Estimated: 52 mL/min — ABNORMAL LOW (ref >60)
Glucose, Bld: 100 mg/dL — ABNORMAL HIGH (ref 70–99)
Potassium: 3.5 mmol/L (ref 3.5–5.1)
Sodium: 135 mmol/L (ref 135–145)

## 2024-01-04 MED ORDER — DIPHENHYDRAMINE HCL 25 MG PO CAPS: 25.0000 mg | ORAL_CAPSULE | Freq: Four times a day (QID) | ORAL | Status: DC | PRN

## 2024-01-04 MED ORDER — ONDANSETRON HCL 4 MG PO TABS
4.0000 mg | ORAL_TABLET | Freq: Three times a day (TID) | ORAL | Status: DC | PRN
  Administered 2024-01-04 – 2024-01-08 (×5): 4 mg via ORAL
  Filled 2024-01-04 (×6): qty 1

## 2024-01-04 MED ORDER — AMLODIPINE BESYLATE 5 MG PO TABS
10.0000 mg | ORAL_TABLET | Freq: Every day | ORAL | Status: DC
Start: 2024-01-04 — End: 2024-01-08
  Administered 2024-01-04 – 2024-01-08 (×4): 10 mg via ORAL
  Filled 2024-01-04 (×6): qty 2

## 2024-01-04 MED ORDER — GLYCOPYRROLATE 1 MG PO TABS
1.0000 mg | ORAL_TABLET | Freq: Four times a day (QID) | ORAL | Status: DC
  Administered 2024-01-04 – 2024-01-08 (×10): 1 mg via ORAL
  Filled 2024-01-04 (×16): qty 1

## 2024-01-04 MED ORDER — ONDANSETRON 4 MG PO TBDP
4.0000 mg | ORAL_TABLET | Freq: Four times a day (QID) | ORAL | Status: DC
Start: 2024-01-04 — End: 2024-01-04
  Filled 2024-01-04: qty 1

## 2024-01-04 MED ORDER — METOCLOPRAMIDE HCL 10 MG PO TABS
10.0000 mg | ORAL_TABLET | Freq: Four times a day (QID) | ORAL | Status: DC
  Administered 2024-01-04 – 2024-01-08 (×10): 10 mg via ORAL
  Filled 2024-01-04 (×16): qty 1

## 2024-01-04 NOTE — Progress Notes (Signed)
 Patient requesting I come to bedside to review management, she is concerned about the plans.  Upon entering the room, patient was eating [facility] however as soon as we entered she noted that the food was making her sick and could not eat anymore.  She notes that she is afraid that when she eats she is going to vomit.  She notes that yesterday she had a good day and was upset that the plan was changing.  As patient mentioned, she did well yesterday which suggest to me, it is time to continue to advance the management and work on transition to discharge.  Reviewed with patient that at this time from a medical standpoint there is no reason why she should not be able to tolerate regular diet.  We reviewed consultations, imaging and lab work no abnormalities were noted.  Discussed with patient deconditioning and my concerns that it is going to take a moment for her body to readjust.  We reviewed expectations and that occasional vomiting episodes may happen; however goal is to keep majority of what she eats down.  Also discussed, that small meals are better than no meals. Discussed plan to slowly transition IV to oral medications as she has been doing well without significant episodes of emesis for the past several days.  We discussed stepwise approach to slowly decreasing IV to oral medicine and reviewed with patient that long-term management plan cannot be hospitalization.  Discussed goal of discharge by next week. During our discussion, patient was agreeable but argumentative and was hesitant to make any change.  I again reviewed the importance of stepwise progression in her care.  Both RN and family member were present for the above discussion  Deo Mehringer, DO Attending Obstetrician & Gynecologist, Biochemist, Clinical for [employer], [facility]

## 2024-01-04 NOTE — Progress Notes (Signed)
 Pt refusing to get up, order food, CHG wipes, and JP drain care. Pt educated.

## 2024-01-04 NOTE — Progress Notes (Signed)
 Nurse encouraged patient to get up to go to the bathroom and order lunch. Patient stated she did not want to get up and eat because she is too tired. Patient educated on the importance of ambulating and eating to prepare for discharge.

## 2024-01-04 NOTE — Progress Notes (Signed)
 Pt refusing to let nurse empty and flush JP drain at this time

## 2024-01-04 NOTE — Progress Notes (Addendum)
 [facility]                                                                               Misty Walsh, is a 33 y.o. female, DOB - 08/13/1990, FMW:[id number] Admit date - 12/26/2023    Outpatient Primary MD for the patient is Buck Search, PA-C  LOS - 9  days    Brief summary    51F h/o HTN, HLD, DM2, PE and recent complicated course following pelvic abscesses and sepsis, status post exploratory laparotomy, multiple intra-abdominal fluid collections, some partially drained with refractory nausea vomiting c/b hypokalemia.  [facility] asked to consult on this patient.   CT chest, abdomen and pelvis showing Similar right adnexal abscess with percutaneous drain in place. Subcutaneous collection of fluid and air involving the right rectus abdominus muscle as has decreased slightly in size in the interval and is worrisome for an abscess.  Retro umbilical fluid collection is stable to minimally enlarged and may represent a seroma. Difficult to exclude abscess. Persistent moderate right hydronephrosis with decreased renal function, likely related to the aforementioned pelvic abscess.  Her vomiting has resolved. She was able to eat a little bit of salad yesterday and tolerated.  Reinforced the importance of moving and ambulating, to help with her nausea.   Assessment & Plan   Intractable nausea and vomiting leading to persistent hypokalemia Slowly improving. She is more alert and answering questions appropriately.  S/p EGD by gastroenterology.  Showing erythematous mucosa in the stomach likely reactive and related to vomiting and retching. Persistent nausea and vomiting probably from persistent abscesses seen on the repeat CT abdomen and pelvis. Since there has been no vomiting in the last 3 days, and some intermittent nausea . Recommend changing her anti emetics to PRN.     Abdominal wall abscess IR consulted for drain and fluid to be sent for analysis. Fluid sent  and pending.  ID consulted for new abscesses, since cultures have been negative, will watch her off antibiotics.     Pelvic abscesses  s/p pelvic drain placement 10/29 with Dr. Vanice, and abdominal wall fluid collection s/p aspiration on 11/13 by Dr. Hughes.  Right transgluteal drain on 10/29,.  S/p exp laparotomy on 10/20, abdominal wall wound connected wound VAC. Please notify IR prior to discharge, so they can arrange outpatient follow up of the drain and wound vac.     Hypertension Blood pressure parameters are optimal.  At home she is on chlorthalidone , amlodipine and Losartan.  Would hold on Losartan and Chlorthalidone  in view of renal insufficiency.  Recommend restarting her on amlodipine 10 mg daily  ( which will be ordered)     Type 2 diabetes mellitus well controlled CBG'S  A1c 6.3.  while patient is on SSI while inpatient, she is on metformin as outpatient. Would hold off starting her on metformin view of her ongoing intermittent nausea for atleast another  4 weeks.  Once her abdominal symptoms have completely resolved. Recommending slowly restarting metformin with 500 mg daily as outpatient as per her PCP.    History of pulmonary embolism  on Lovenox injections currently. Since she is tolerating oral, transition to eliquis on discharge.  AKI likely secondary to poor oral intake and patient was also found to have right moderate hydronephrosis from the pelvic abscesses Creatinine slowly improving. And has stabilized at 1.3.  Recommend checking US  renal in 4 weeks to evaluate for improvement in her hydronephrosis.  She would also benefit from ambulatory referral to Urology on discharge ( will be ordered ).  Recommend checking creatinine in 1 week on discharge.    Hypokalemia and hypomagnesemia Replaced, r repeat levels wnl.  Keep magnesium  >2 and K>4.     Normocytic anemia/anemia of acute illness/anemia of blood loss Baseline hemoglobin around 12, dropped to  10.4 to 9 . Currently stable around 8.9 Anemia panel reviewed  Transfuse to keep it greater than 7   Depression Flat affect.  Psychiatry consulted by primary.  She denies any suicidal ideation.    Morbid obesity.  Body mass index is 47.89 kg/m. Recommend outpatient follow up with PCP for lifestyle changes, weight loss.   Discussed with Primary, will sign off , please call us  back if needed.    Medications  Scheduled Meds:  Chlorhexidine Gluconate Cloth  6 each Topical Daily   enoxaparin (LOVENOX) injection  120 mg Subcutaneous Q12H   feeding supplement  237 mL Oral TID BM   glycopyrrolate   0.2 mg Intravenous QID   hydrALAZINE  10 mg Intravenous Q6H   insulin  aspart  0-20 Units Subcutaneous TID WC   insulin  aspart  0-5 Units Subcutaneous QHS   lidocaine-EPINEPHrine  (PF)  20 mL Infiltration Once   LORazepam  1 mg Intravenous Q6H   metoCLOPramide  (REGLAN ) injection  10 mg Intravenous Q6H   multivitamin with minerals  1 tablet Oral Daily   ondansetron  (ZOFRAN ) IV  4 mg Intravenous Q6H   pantoprazole  (PROTONIX ) IV  40 mg Intravenous Q12H   scopolamine   1 patch Transdermal Q72H   Continuous Infusions:   PRN Meds:.bisacodyl, diphenhydrAMINE , guaiFENesin , haloperidol lactate, HYDROmorphone  (DILAUDID ) injection, menthol , metoprolol tartrate, polyethylene glycol, prochlorperazine , sodium chloride  flush    Subjective:   Misty Walsh was seen and examined today.   No abdominal pain, just waking up.   Objective:   Vitals:   01/04/24 0210 01/04/24 0228 01/04/24 0733 01/04/24 1132  BP: (!) 161/104 (!) 154/92 (!) 159/94 (!) 144/63  Pulse: (!) 101 97 91 92  Resp: 17 17 17 17   Temp: 98.2 F (36.8 C)  98.3 F (36.8 C) 98.9 F (37.2 C)  TempSrc: Oral  Oral Oral  SpO2:   95% 94%  Weight: 126.6 kg     Height:        Intake/Output Summary (Last 24 hours) at 01/04/2024 1251 Last data filed at 01/04/2024 1200 Gross per 24 hour  Intake 102 ml  Output 15 ml  Net 87 ml    Filed Weights   01/04/24 0210  Weight: 126.6 kg     Exam Patient just waking up denies any abdominal pain.  She denies any vomiting , nausea has improved.  CVS s1s2,  Lungs clear   Data Reviewed:  I have personally reviewed following labs and imaging studies   CBC Lab Results  Component Value Date   WBC 4.8 01/04/2024   RBC 2.79 (L) 01/04/2024   HGB 8.7 (L) 01/04/2024   HCT 27.2 (L) 01/04/2024   MCV 97.5 01/04/2024   MCH 31.2 01/04/2024   PLT 223 01/04/2024   MCHC 32.0 01/04/2024   RDW 14.0 01/04/2024   LYMPHSABS 1.5 01/04/2024   MONOABS 0.7 01/04/2024   EOSABS 0.2 01/04/2024  BASOSABS 0.0 01/04/2024     Last metabolic panel Lab Results  Component Value Date   NA 135 01/04/2024   K 3.5 01/04/2024   CL 98 01/04/2024   CO2 24 01/04/2024   BUN 5 (L) 01/04/2024   CREATININE 1.36 (H) 01/04/2024   GLUCOSE 100 (H) 01/04/2024   GFRNONAA 52 (L) 01/04/2024   GFRAA 127 11/03/2019   CALCIUM  8.5 (L) 01/04/2024   PHOS 2.7 12/10/2023   PROT 7.3 12/27/2023   ALBUMIN 3.2 (L) 12/27/2023   LABGLOB 2.7 11/20/2022   AGRATIO 2.0 05/25/2020   BILITOT 1.0 12/27/2023   ALKPHOS 54 12/27/2023   AST 27 12/27/2023   ALT 21 12/27/2023   ANIONGAP 13 01/04/2024    CBG (last 3)  Recent Labs    01/01/24 1306 01/01/24 2012 01/01/24 2206  GLUCAP 92 87 90      Coagulation Profile: No results for input(s): INR, PROTIME in the last 168 hours.   Radiology Studies: No results found.      Elgie Butter M.D. [facility] 01/04/2024, 12:51 PM  Available via Epic secure chat 7am-7pm After 7 pm, please refer to night coverage provider listed on amion.

## 2024-01-04 NOTE — Progress Notes (Signed)
    Faculty Practice OB/GYN Attending Note  Subjective:   No changes from yesterday.  [place] with patient possible transition to MedSurg unit, patient was upset about this plan.  She denies vomiting, but still states that she feels nauseous.  It seems as though patient was able to eat salad yesterday.  Denies fevers or chills.  She does note some intermittent pain, but states she has not taken the medicine.  Reports no acute concerns this morning    Objective:  Blood pressure (!) 144/63, pulse 92, temperature 98.9 F (37.2 C), temperature source Oral, resp. rate 17, height 5' 4 (1.626 m), weight 126.6 kg, last menstrual period 11/18/2023, SpO2 94%.  Gen: NAD Pysch: Mood appropriate, flat affect. Lungs: Normal respiratory effort, CTAB Heart: Regular rate noted Abdomen: obese, soft and non-tender, no rebound or guarding Midline incision- with dressing- clean and dry Minimal serous fluid noted in JP drain Ext: no edema or calf tenderness bilaterally  Assessment & Plan:  33 y.o. [id number] admitted due to nausea/vomiting.  1) GI-intractable nausea and vomiting - While patient continue report nausea and vomiting, RN notes no vomiting overnight.  From a medical standpoint no underlying etiology to explain her symptoms. - Discussed plan with [facility] team this a.m., may try to wean antiemetics - Encourage diet  2) GYN- postop ex-Lap with abscess drainage (10/20)  - Dressing in place appears clean and dry, next changed to be done 11/17 - Meeting postoperative milestones  3)ID -s/p IR drainage on 10/29 and additional drainage of subQ collection on 11/13  - Last imaging completed 12/29/2023, did note improvement in fluid collections  4) Heme - CBC remained stable - Hoping to transition from Lovenox to oral Eliquis once patient able to tolerate more p.o.  5) HTN and type 2 DM -appreciate management per [facility]  DISPO: Continue to provide supportive care, once patient willing to advance diet  will plan for discharge  Misty Goedecke, DO Attending Obstetrician & Gynecologist, Faculty Practice Center for [employer], [facility]

## 2024-01-05 ENCOUNTER — Inpatient Hospital Stay (HOSPITAL_COMMUNITY): Payer: MEDICAID

## 2024-01-05 LAB — GLUCOSE, CAPILLARY
Glucose-Capillary: 89 mg/dL (ref 70–99)
Glucose-Capillary: 79 mg/dL (ref 70–99)
Glucose-Capillary: 93 mg/dL (ref 70–99)
Glucose-Capillary: 93 mg/dL (ref 70–99)
Glucose-Capillary: 102 mg/dL — ABNORMAL HIGH (ref 70–99)
Glucose-Capillary: 99 mg/dL (ref 70–99)
Glucose-Capillary: 99 mg/dL (ref 70–99)
Glucose-Capillary: 100 mg/dL — ABNORMAL HIGH (ref 70–99)
Glucose-Capillary: 96 mg/dL (ref 70–99)

## 2024-01-05 MED ORDER — APIXABAN 5 MG PO TABS
5.0000 mg | ORAL_TABLET | Freq: Two times a day (BID) | ORAL | Status: DC
  Administered 2024-01-05 – 2024-01-08 (×5): 5 mg via ORAL
  Filled 2024-01-05 (×8): qty 1

## 2024-01-05 MED ORDER — LORAZEPAM 1 MG PO TABS: 0.5000 mg | ORAL_TABLET | Freq: Four times a day (QID) | ORAL | Status: DC | PRN

## 2024-01-05 MED ORDER — OXYCODONE-ACETAMINOPHEN 5-325 MG PO TABS
1.0000 | ORAL_TABLET | Freq: Four times a day (QID) | ORAL | Status: DC | PRN
Start: 2024-01-05 — End: 2024-01-08
  Administered 2024-01-05 – 2024-01-06 (×3): 2 via ORAL
  Administered 2024-01-06: 1 via ORAL
  Administered 2024-01-07: 2 via ORAL
  Filled 2024-01-05 (×2): qty 2
  Filled 2024-01-05: qty 1
  Filled 2024-01-05: qty 2
  Filled 2024-01-05: qty 1
  Filled 2024-01-05: qty 2

## 2024-01-05 MED ORDER — PANTOPRAZOLE SODIUM 40 MG PO TBEC
40.0000 mg | DELAYED_RELEASE_TABLET | Freq: Two times a day (BID) | ORAL | Status: DC
  Administered 2024-01-05 – 2024-01-08 (×5): 40 mg via ORAL
  Filled 2024-01-05 (×7): qty 1

## 2024-01-05 MED ORDER — ENSURE PLUS HIGH PROTEIN PO LIQD
237.0000 mL | ORAL | Status: DC
  Filled 2024-01-05 (×3): qty 237

## 2024-01-05 MED ORDER — IOHEXOL 350 MG/ML SOLN
75.0000 mL | Freq: Once | INTRAVENOUS | Status: AC | PRN
  Administered 2024-01-05: 75 mL via INTRAVENOUS

## 2024-01-05 NOTE — Progress Notes (Signed)
 [facility]                                                                               Misty Walsh, is a 33 y.o. female, DOB - 1990/03/23, FMW:[id number] Admit date - 12/26/2023    Outpatient Primary MD for the patient is Buck Search, PA-C  LOS - 10  days    Brief summary    50F h/o HTN, HLD, DM2, PE and recent complicated course following pelvic abscesses and sepsis, status post exploratory laparotomy, multiple intra-abdominal fluid collections, some partially drained with refractory nausea vomiting c/b hypokalemia.  [facility] asked to consult on this patient.   CT chest, abdomen and pelvis showing Similar right adnexal abscess with percutaneous drain in place. Subcutaneous collection of fluid and air involving the right rectus abdominus muscle as has decreased slightly in size in the interval and is worrisome for an abscess.  Retro umbilical fluid collection is stable to minimally enlarged and may represent a seroma. Difficult to exclude abscess. Persistent moderate right hydronephrosis with decreased renal function, likely related to the aforementioned pelvic abscess.  TRH signed off on 11/16, but was requested to follow the patient for continued care for medical issues.   Patient seen and examined today. She was waking up, denies any vomiting or abdominal pain.  She reports intermittent nausea, but had a salad last night and tolerated.   Assessment & Plan   Intractable nausea and vomiting leading to persistent hypokalemia Slowly improving.   S/p EGD by gastroenterology.  Showing erythematous mucosa in the stomach likely reactive and related to vomiting and retching. Persistent nausea and vomiting probably from persistent abscesses seen on the repeat CT abdomen and pelvis. Since there has been no vomiting in the last 3 days, and some intermittent nausea . Recommend changing her anti emetics to  oral and PRN.     Abdominal wall abscess IR  consulted for drain and fluid to be sent for analysis. Fluid sent and pending.  ID consulted for new abscesses, since cultures have been negative, will watch her off antibiotics.  ID  has signed off.    Pelvic abscesses  s/p pelvic drain placement 10/29 with Dr. Vanice, and abdominal wall fluid collection s/p aspiration on 11/13 by Dr. Hughes.  Right transgluteal drain on 10/29,.  S/p exp laparotomy on 10/20, abdominal wall wound connected wound VAC which was removed yesterday.  Please notify IR prior to discharge, so they can arrange outpatient follow up of the drain and wound vac.     Hypertension Blood pressure parameters are optimal.   At home she is on chlorthalidone , amlodipine and Losartan.  Would hold on Losartan and Chlorthalidone  in view of renal insufficiency.  Recommend restarting her on amlodipine 10 mg daily  ( which will be ordered)     Type 2 diabetes mellitus well controlled CBG'S A1c 6.3.  she  is on SSI while inpatient, she is on metformin as outpatient. Would hold off starting her on metformin view of her ongoing intermittent nausea for atleast another  4 weeks.  Once her abdominal symptoms have completely resolved. Recommending slowly restarting metformin with 500 mg daily as outpatient as per  her PCP.    History of pulmonary embolism  on Lovenox injections currently. Since she is tolerating oral, transition to eliquis.     AKI likely secondary to poor oral intake and patient was also found to have right moderate hydronephrosis from the pelvic abscesses Creatinine slowly improving. And has stabilized at 1.3.  Recommend checking US  renal in 4 weeks to evaluate for improvement in her hydronephrosis.  She would also benefit from ambulatory referral to Urology on discharge ( will be ordered ).  Recommend checking creatinine in 1 week on discharge.    Hypokalemia and hypomagnesemia Replaced, r repeat levels wnl.  Keep magnesium  >2 and K>4.     Normocytic  anemia/anemia of acute illness/anemia of blood loss Baseline hemoglobin around 12, dropped to 10.4 to 9 . Currently stable around 8.9 Anemia panel reviewed  Transfuse to keep it greater than 7   Depression Flat affect.  Psychiatry consulted by primary.  She denies any suicidal ideation.    Morbid obesity.  Body mass index is 48.47 kg/m. Recommend outpatient follow up with PCP for lifestyle changes, weight loss.      Medications  Scheduled Meds:  amLODipine  10 mg Oral Daily   apixaban  5 mg Oral BID   Chlorhexidine Gluconate Cloth  6 each Topical Daily   feeding supplement  237 mL Oral TID BM   glycopyrrolate   1 mg Oral Q6H   insulin  aspart  0-20 Units Subcutaneous TID WC   insulin  aspart  0-5 Units Subcutaneous QHS   lidocaine-EPINEPHrine  (PF)  20 mL Infiltration Once   metoCLOPramide   10 mg Oral Q6H   multivitamin with minerals  1 tablet Oral Daily   pantoprazole   40 mg Oral BID   scopolamine   1 patch Transdermal Q72H   Continuous Infusions:   PRN Meds:.bisacodyl, diphenhydrAMINE , guaiFENesin , HYDROmorphone  (DILAUDID ) injection, LORazepam, menthol , metoprolol tartrate, ondansetron , oxyCODONE -acetaminophen , polyethylene glycol, prochlorperazine , sodium chloride  flush    Subjective:   Misty Walsh was seen and examined today.  No vomiting or abdominal pain. Intermittent nausea.  No fevers or chills.   Objective:   Vitals:   01/05/24 0605 01/05/24 0759 01/05/24 0813 01/05/24 1228  BP: (!) 147/73 (!) 151/78  (!) 140/86  Pulse: 82 85  86  Resp:  20  20  Temp: 98 F (36.7 C) 98.4 F (36.9 C)  98.4 F (36.9 C)  TempSrc:  Oral  Oral  SpO2:  98%  98%  Weight:   128.1 kg   Height:        Intake/Output Summary (Last 24 hours) at 01/05/2024 1233 Last data filed at 01/05/2024 0910 Gross per 24 hour  Intake 485 ml  Output 360 ml  Net 125 ml   Filed Weights   01/04/24 0210 01/05/24 0813  Weight: 126.6 kg 128.1 kg     Exam General exam: Appears calm  and comfortable  Respiratory system: Clear to auscultation. Respiratory effort normal. Cardiovascular system: S1 & S2 heard, RRR. No JVD, Gastrointestinal system: Abdomen is SOFT, some bruising over the lateral aspect of the abdomen. Wound vac removed. + gluteal drain.  Central nervous system: Alert and oriented.  Extremities: pedal edema.  Skin: No rashes,  Psychiatry:  flat affect.      Data Reviewed:  I have personally reviewed following labs and imaging studies   CBC Lab Results  Component Value Date   WBC 4.8 01/04/2024   RBC 2.79 (L) 01/04/2024   HGB 8.7 (L) 01/04/2024   HCT 27.2 (L) 01/04/2024  MCV 97.5 01/04/2024   MCH 31.2 01/04/2024   PLT 223 01/04/2024   MCHC 32.0 01/04/2024   RDW 14.0 01/04/2024   LYMPHSABS 1.5 01/04/2024   MONOABS 0.7 01/04/2024   EOSABS 0.2 01/04/2024   BASOSABS 0.0 01/04/2024     Last metabolic panel Lab Results  Component Value Date   NA 135 01/04/2024   K 3.5 01/04/2024   CL 98 01/04/2024   CO2 24 01/04/2024   BUN 5 (L) 01/04/2024   CREATININE 1.36 (H) 01/04/2024   GLUCOSE 100 (H) 01/04/2024   GFRNONAA 52 (L) 01/04/2024   GFRAA 127 11/03/2019   CALCIUM  8.5 (L) 01/04/2024   PHOS 2.7 12/10/2023   PROT 7.3 12/27/2023   ALBUMIN 3.2 (L) 12/27/2023   LABGLOB 2.7 11/20/2022   AGRATIO 2.0 05/25/2020   BILITOT 1.0 12/27/2023   ALKPHOS 54 12/27/2023   AST 27 12/27/2023   ALT 21 12/27/2023   ANIONGAP 13 01/04/2024    CBG (last 3)  Recent Labs    01/04/24 0903 01/04/24 1426 01/04/24 2029  GLUCAP 99 99 100*      Coagulation Profile: No results for input(s): INR, PROTIME in the last 168 hours.   Radiology Studies: No results found.      Elgie Butter M.D. [facility] 01/05/2024, 12:33 PM  Available via Epic secure chat 7am-7pm After 7 pm, please refer to night coverage provider listed on amion.

## 2024-01-05 NOTE — Progress Notes (Signed)
 Referring Provider(s): Dr. Bebe Furry, MD   Supervising Physician: Dr. Cordella Banner   Patient Status:  [facility] - In-pt   Chief Complaint: Pelvic abscess; s/p pelvic drain placement 10/29 with Dr. Vanice, and abdominal wall fluid collection s/p aspiration on 11/13 by Dr. Hughes.   Subjective:   Patient alert and sitting up in bed, calm.  Patient is complaining of soreness at and around drain insertion site but no abd pain, N/V. Patient denies any fevers, chest pain, SOB, cough, or bleeding.   Allergies: Macrobid  [nitrofurantoin  macrocrystal]  Medications: Prior to Admission medications   Medication Sig Start Date End Date Taking? Authorizing Provider  amLODipine (NORVASC) 5 MG tablet Take 1 tablet (5 mg total) by mouth daily. 12/14/23   Zina Jerilynn LABOR, MD  apixaban (ELIQUIS) 5 MG TABS tablet Take 2 tablets by mouth twice a day for 3 doses then start 1 tablet twice a day thereafter 12/23/23   Arnold, James G, MD  atorvastatin (LIPITOR) 80 MG tablet Take 1 tablet (80 mg total) by mouth daily. 12/14/23   Ozan, Jennifer, DO  chlorthalidone  (HYGROTON ) 25 MG tablet Take 1 tablet (25 mg total) by mouth daily. 12/14/23   Zina Jerilynn LABOR, MD  glycopyrrolate  (ROBINUL ) 1 MG tablet Take 1 tablet (1 mg total) by mouth 3 (three) times daily. 12/14/23   Zina Jerilynn LABOR, MD  losartan (COZAAR) 50 MG tablet Take 1 tablet (50 mg total) by mouth daily. 12/14/23   Zina Jerilynn LABOR, MD  metFORMIN (GLUCOPHAGE) 500 MG tablet Take 1 tablet (500 mg total) by mouth 2 (two) times daily with a meal. 12/23/23   Jayne Vonn DEL, MD  metoCLOPramide  (REGLAN ) 10 MG tablet Take 1 tablet (10 mg total) by mouth every 8 (eight) hours for 14 days. 12/23/23 01/06/24  Jayne Vonn DEL, MD  ondansetron  (ZOFRAN ) 4 MG tablet Take 1 tablet (4 mg total) by mouth every 6 (six) hours as needed for nausea or vomiting. 12/14/23   Zina Jerilynn LABOR, MD  promethazine  (PHENERGAN ) 25 MG tablet Take 1 tablet (25 mg total) by mouth every  6 (six) hours as needed for refractory nausea / vomiting. 12/13/23   Ozan, Jennifer, DO  scopolamine  (TRANSDERM-SCOP) 1 MG/3DAYS Place 1 patch (1 mg total) onto the skin every 3 (three) days. 12/15/23   Zina Jerilynn LABOR, MD  sodium chloride  flush (NS) 0.9 % SOLN Place 10 mLs into feeding tube daily AND flush drain once daily with 5 mL saline from prefilled syringe. 12/23/23 02/21/24  Jayne Vonn DEL, MD     Vital Signs: BP (!) 140/86 (BP Location: Left Wrist)   Pulse 86   Temp 98.4 F (36.9 C) (Oral)   Resp 20   Ht 5' 4 (1.626 m)   Wt 282 lb 6.4 oz (128.1 kg)   LMP 11/18/2023 (Exact Date)   SpO2 98%   BMI 48.47 kg/m   Physical Exam Constitutional:      Appearance: Normal appearance.  Cardiovascular:     Rate and Rhythm: Normal rate.  Pulmonary:     Effort: Pulmonary effort is normal.  Musculoskeletal:        General: Normal range of motion.  Skin:    General: Skin is warm and dry.     Comments: Right transgluteal drain appropriately dressed. Dressing clean, dry, intact.  Insertion site remains mildly tender. Retaining suture and Stat Lock in place.  5 cc serosanguinous output in collection bulb tubing. Line flushes and aspirates well.    Neurological:  Mental Status: She is alert and oriented to person, place, and time.      Labs:  CBC: Recent Labs    01/01/24 0513 01/02/24 0613 01/03/24 0823 01/04/24 0515  WBC 4.9 4.4 4.6 4.8  HGB 9.1* 8.9* 8.9* 8.7*  HCT 28.0* 27.6* 27.8* 27.2*  PLT 253 243 224 223    COAGS: Recent Labs    12/05/23 0730 12/08/23 0436 12/15/23 1428 12/16/23 0849 12/17/23 0538 12/17/23 2117 12/18/23 0451  INR 1.2 1.2  --  1.3* 1.2  --   --   APTT  --   --    < > 85* 60* 79* 69*   < > = values in this interval not displayed.    BMP: Recent Labs    12/31/23 0446 01/01/24 0513 01/02/24 0613 01/04/24 0515  NA 136 134* 136 135  K 3.1* 3.2* 3.6 3.5  CL 99 99 101 98  CO2 25 25 22 24   GLUCOSE 84 91 85 100*  BUN 6 5* <5* 5*   CALCIUM  8.2* 8.2* 8.5* 8.5*  CREATININE 1.27* 1.27* 1.32* 1.36*  GFRNONAA 57* 57* 54* 52*    LIVER FUNCTION TESTS: Recent Labs    12/13/23 0415 12/21/23 0423 12/26/23 1842 12/27/23 0917  BILITOT 0.5 0.2 1.0 1.0  AST 41 28 36 27  ALT 24 19 21 21   ALKPHOS 48 44 58 54  PROT 6.3* 6.5 7.9 7.3  ALBUMIN 2.3* 2.4* 3.5 3.2*    Assessment and Plan:  Pelvic abscess; s/p pelvic drain placement 10/29 with Dr. Vanice, and abdominal wall fluid collection s/p aspiration on 11/13 by Dr. Hughes.   Drain Location: right transgluteal pelvic drain  Size: Fr size: 10 Fr Date of placement: 12/17/23  Currently to: Drain collection device: suction bulb 24 hour output:  Output by Drain (mL) 01/03/24 0701 - 01/03/24 1900 01/03/24 1901 - 01/04/24 0700 01/04/24 0701 - 01/04/24 1900 01/04/24 1901 - 01/05/24 0700 01/05/24 0701 - 01/05/24 1528  Closed System Drain 3 Inferior;Right Back Bulb (JP) 10 Fr. 5 5 5  10     Interval imaging/drain manipulation:  12/30/23 CT chest/abd/pelv: Percutaneous drain in the cul-de-sac region with a residual 4.7 x 8.0 cm heterogeneous fluid collection, grossly similar to 12/26/2023.   Current examination: Flushes/aspirates easily.  Dressed appropriately.  Output< 10 ml/day for mult days VSS WBC WNL   Plan:  Marked output decrease over the past week indicates possible resolution of drain. Order placed for repeat CT abd/pelv today. IR APP will review with IR rad tomorrow and determine whether drain injection and subsequent removal are indicated.    Discharge planning: Please contact IR APP or on call IR MD prior to patient d/c to ensure appropriate follow up plans are in place. Typically patient will follow up with IR clinic 10-14 days post d/c for repeat imaging/possible drain injection. IR scheduler will contact patient with date/time of appointment. Patient will need to flush drain QD with 5 cc NS, record output QD, dressing changes every 2-3 days or earlier if  soiled.   IR will continue to follow - please call with questions or concerns.      Thank you for this interesting consult.  I greatly enjoyed meeting Misty Walsh and look forward to participating in their care.   Electronically Signed: Laymon Coast, NP 01/05/2024, 3:28 PM    I spent a total of 15 Minutes at the the patient's bedside AND on the patient's hospital floor or unit, greater than 50% of which was  counseling/coordinating care for right transgluteal pelvic drain follow-up.

## 2024-01-05 NOTE — Progress Notes (Signed)
    [facility]N Attending Note  Subjective:  She denies vomiting, but still states that she feels nauseous.  Has not had breakfast yet this morning. Denies fevers or chills.  She does note some intermittent pain but controlled with pain medications. Reports no acute concerns this morning    Objective:  Blood pressure (!) 151/78, pulse 85, temperature 98.4 F (36.9 C), temperature source Oral, resp. rate 20, height 5' 4 (1.626 m), weight 128.1 kg, last menstrual period 11/18/2023, SpO2 98%.  Gen: NAD Pysch: Mood appropriate, flat affect. Lungs: Normal respiratory effort, CTAB Heart: Regular rate noted Abdomen: obese, soft and non-tender, no rebound or guarding Midline incision- with dressing- clean and dry Minimal serous fluid noted in JP drain Ext: no edema or calf tenderness bilaterally  Assessment & Plan:  33 y.o. G1P0101 admitted due to nausea/vomiting.  1) GI-intractable nausea and vomiting - While patient continue report nausea and vomiting, RN notes no vomiting overnight.  From a medical standpoint no underlying etiology to explain her symptoms. - Encourage diet - Transition IV meds to PO  2) GYN- postop ex-Lap with abscess drainage (10/20)  - Dressing in place appears clean and dry, dressing changed this morning by RN - Meeting postoperative milestones - will discuss with IR about drain removal given minimal output  3)ID - s/p IR drainage on 10/29 and additional drainage of subQ collection on 11/13  - Last imaging completed 12/29/2023, did note improvement in fluid collections  4) Heme/ PE - CBC remained stable - transition to eliquis today as per pharmacist  5) Multiple medical issues including HTN and type 2 DM, AKI - TRH signed off yesterday however will request continued involvement given complex patient care and coordination of care upon discharge  DISPO: Continue to provide supportive care, transitioning to oral meds and will continue to advance  care in anticipation of discharge  Rollo ONEIDA Bring, MD, FACOG Obstetrician & Gynecologist, [facility] for [employer], [facility]

## 2024-01-05 NOTE — Progress Notes (Signed)
 Nutrition Follow-up  DOCUMENTATION CODES:  Obesity unspecified  INTERVENTION:  Encourage good PO intake  Recommend family to bring outside food  Decrease Ensure Plus High Protein po Daily, each supplement provides 350 kcal and 20 grams of protein Discontinue Multivitamin w/ minerals daily  NUTRITION DIAGNOSIS:  Inadequate oral intake related to nausea, vomiting as evidenced by  (per notes). - Ongoing  GOAL:  Patient will meet greater than or equal to 90% of their needs - Ongoing  MONITOR:  PO intake, Supplement acceptance, Diet advancement, Labs, Weight trends  REASON FOR ASSESSMENT:  Consult Poor PO  ASSESSMENT:  33 y.o. female presented with intractable nausea and vomiting. Pt recently admitted with recurrent pelvic abscess on 10/13-11/4 and underwent an ex-lap for cyst removal. Pt discharged home with drain in place and wound VAC. PMH includes T2DM, HLD, HTN, and GERD. Pt admitted with intractable nausea and vomiting, AKI and hypokalemia.   11/07 - Admitted  11/08 - diet advanced to dull liquids 11/10 - s/p EGD; diet advanced to clear liquids 11/14 - diet advanced to full liquids; advanced to regular later; wound VAC removed  RD attempted to see patient x 2. Pt in rest room and then being transferred for CT.  RN reports that pt did not have any episodes of emesis over the weekend, but did have one this morning. Reports that she tolerated eating [employer] brought in by family yesterday and was brought in again today. RN reports that pt refuses the multivitamin due to being too big and RN suggested crushing/or cutting in half, pt still refused.  Discussed pt acceptance of oral nutrition supplements; RN reports that pt is refusing these as well. Last week, pt specifically requested Ensure over Boost Breeze and had to wait upon diet advancement to order supplement.  Admission Weight: 126.1 kg  Current Weight: 128.1 kg  Nutrition Related Medications: Robinul , NovoLog 0-20 units TID +  0-5 units daily, Reglan  q6h, MVI, Protonix , Scopolamine  patch Labs: reviewed CBG: 96-100 mg/dL x 24 hrs   NUTRITION - FOCUSED PHYSICAL EXAM: Deferred.  Diet Order:   Diet Order             Diet regular Room service appropriate? Yes; Fluid consistency: Thin  Diet effective now                  EDUCATION NEEDS:  Not appropriate for education at this time  Skin:  Skin Assessment: Reviewed RN Assessment  Last BM:  11/16 per RN  Height:  Ht Readings from Last 1 Encounters:  12/28/23 5' 4 (1.626 m)   Weight:  Wt Readings from Last 1 Encounters:  01/05/24 128.1 kg   Ideal Body Weight:  54.6 kg  BMI:  Body mass index is 48.47 kg/m.  Estimated Nutritional Needs:  Kcal:  2100-2300 Protein:  105-125 grams Fluid:  >/= 2 L   Nestora Glatter RD, LDN Registered Dietitian I Please see AMION for contact information

## 2024-01-05 NOTE — Progress Notes (Addendum)
 PHARMACY - ANTICOAGULATION CONSULT NOTE  Pharmacy Consult for conversion from lovenox to Eliquis Indication: pulmonary embolus and DVT  Allergies  Allergen Reactions   Macrobid  [Nitrofurantoin  Macrocrystal] Itching    Caused the patient to feel hot.    Patient Measurements: Height: 5' 4 (162.6 cm) Weight: 128.1 kg (282 lb 6.4 oz) IBW/kg (Calculated) : 54.7 HEPARIN DW (KG): 86.5  Vital Signs: Temp: 98.4 F (36.9 C) (11/17 0759) Temp Source: Oral (11/17 0759) BP: 151/78 (11/17 0759) Pulse Rate: 85 (11/17 0759)  Labs: Recent Labs    01/03/24 0823 01/04/24 0515  HGB 8.9* 8.7*  HCT 27.8* 27.2*  PLT 224 223  CREATININE  --  1.36*    Estimated Creatinine Clearance: 77.4 mL/min (A) (by C-G formula based on SCr of 1.36 mg/dL (H)).   Medical History: Past Medical History:  Diagnosis Date   Anxiety    Back pain    Chlamydia    Depression med made her navel itching and  made her sleepy so she quit taking them   Diabetes mellitus without complication (HCC)    Edema, lower extremity    Elevated cholesterol    Fibrocystic breast changes of both breasts    GERD (gastroesophageal reflux disease)    History of cesarean section, classical 12/18/2012   2014    Human papilloma virus    Hypertension    Insomnia    Joint pain    Moderate dysplasia of cervix    Numbness    right arm to hand   Ovarian cyst    Prediabetes    Tubo-ovarian abscess 12/02/2023   Vitamin D  deficiency     Medications:  Scheduled:   amLODipine  10 mg Oral Daily   apixaban  5 mg Oral BID   Chlorhexidine Gluconate Cloth  6 each Topical Daily   feeding supplement  237 mL Oral TID BM   glycopyrrolate   1 mg Oral Q6H   insulin  aspart  0-20 Units Subcutaneous TID WC   insulin  aspart  0-5 Units Subcutaneous QHS   lidocaine-EPINEPHrine  (PF)  20 mL Infiltration Once   LORazepam  1 mg Intravenous Q6H   metoCLOPramide   10 mg Oral Q6H   multivitamin with minerals  1 tablet Oral Daily   pantoprazole    40 mg Oral BID   scopolamine   1 patch Transdermal Q72H   PRN: bisacodyl, diphenhydrAMINE , guaiFENesin , haloperidol lactate, HYDROmorphone  (DILAUDID ) injection, menthol , metoprolol tartrate, ondansetron , polyethylene glycol, prochlorperazine , sodium chloride  flush  Assessment: Pt has been treated for intractable nausea and vomiting.  Her symptoms are improving and medications are slowly being changed to oral route.  Pt has been on Lovenox treatment dose while Eliquis was held due to N/V.  Will change back to Eliquis today.   Plan:  Restart Eliquis 5 mg BID today.  Will restart when lovenox dose would have been due (~10:00 this am). Also changed Protonix  40 mg IV BID to 40 mg po BID per IV/PO protocol.  Neill Clarity 01/05/2024,9:06 AM

## 2024-01-05 NOTE — Plan of Care (Signed)
 Patient without sepsis admitted for new fluid collection after previous TOA exlap/I&D  Cx ngtd about 10 day soff abx Remains without sepsis off abx  ID will sign off and can continue to observe off abx from our standpoint   Discussed with primary team

## 2024-01-06 LAB — AEROBIC/ANAEROBIC CULTURE W GRAM STAIN (SURGICAL/DEEP WOUND)
Culture: NO GROWTH
Culture: NO GROWTH

## 2024-01-06 LAB — URINALYSIS, W/ REFLEX TO CULTURE (INFECTION SUSPECTED)
Bacteria, UA: NONE SEEN
Bilirubin Urine: NEGATIVE
Glucose, UA: NEGATIVE mg/dL
Ketones, ur: NEGATIVE mg/dL
Leukocytes,Ua: NEGATIVE
Nitrite: NEGATIVE
Protein, ur: NEGATIVE mg/dL
Specific Gravity, Urine: 1.017 (ref 1.005–1.030)
pH: 7 (ref 5.0–8.0)

## 2024-01-06 LAB — COMPREHENSIVE METABOLIC PANEL WITH GFR
ALT: 22 U/L (ref 0–44)
AST: 26 U/L (ref 15–41)
Albumin: 2.9 g/dL — ABNORMAL LOW (ref 3.5–5.0)
Alkaline Phosphatase: 47 U/L (ref 38–126)
Anion gap: 12 (ref 5–15)
BUN: <5 mg/dL — ABNORMAL LOW (ref 6–20)
CO2: 26 mmol/L (ref 22–32)
Calcium: 9 mg/dL (ref 8.9–10.3)
Chloride: 97 mmol/L — ABNORMAL LOW (ref 98–111)
Creatinine, Ser: 1.3 mg/dL — ABNORMAL HIGH (ref 0.44–1.00)
GFR, Estimated: 55 mL/min — ABNORMAL LOW (ref >60)
Glucose, Bld: 98 mg/dL (ref 70–99)
Potassium: 3.3 mmol/L — ABNORMAL LOW (ref 3.5–5.1)
Sodium: 135 mmol/L (ref 135–145)
Total Bilirubin: 0.6 mg/dL (ref 0.0–1.2)
Total Protein: 6.3 g/dL — ABNORMAL LOW (ref 6.5–8.1)

## 2024-01-06 LAB — GLUCOSE, CAPILLARY: Glucose-Capillary: 97 mg/dL (ref 70–99)

## 2024-01-06 MED ORDER — POTASSIUM CHLORIDE CRYS ER 20 MEQ PO TBCR
40.0000 meq | EXTENDED_RELEASE_TABLET | Freq: Two times a day (BID) | ORAL | Status: AC
  Filled 2024-01-06 (×2): qty 2

## 2024-01-06 NOTE — TOC Progression Note (Addendum)
 Transition of Care [facility]) - Progression Note    Patient Details  Name: Misty Walsh MRN: [id number] Date of Birth: Oct 03, 1990  Transition of Care [facility]) CM/SW Contact  Rosaline JONELLE Joe, RN Phone Number: 01/06/2024, 3:16 PM  Clinical Narrative:    CM met with the patient at the bedside and patient plans to return home today/tomorrow once her JP bulb drain has been removed.  Patient states that she lives alone but normally her 65 year old son stays with her.  Patient has no DME at the home.  Patient had recent [employer] wound vac that was removed on 01/02/24.  I asked that patient return the supplies in the Boxes as directed to the company when she returns home.  Patient has been screened for Medicaid and is waiting on determination.  Lower abdominal foam dressed noted to be intact at this time since wound vac dressing has been removed.  I asked that bedside nursing provide dressing supplies for home.  Patient plans to follow up with her PCP when discharged home from the hospital.  No other IP Care management needs.  Patient to be discharged home once medically stable per MD.  MD states that she is waiting to hear back from IR to see if drain will be removed prior to discharge.  Patient will not be discharged today per MD.     Expected Discharge Plan: Home/Self Care Barriers to Discharge: Financial Resources, Continued Medical Work up               Expected Discharge Plan and Services In-house Referral: Clinical Social Work, Nutrition, Conservator, Museum/gallery Services: [facility]   Living arrangements for the past 2 months: Apartment                   DME Agency:  ([employer]- wound vac- peel / stick - continue as set up PTA)                   Social Drivers of Health (SDOH) Interventions SDOH Screenings   Food Insecurity: No Food Insecurity (12/26/2023)  Housing: Low Risk  (12/26/2023)  Transportation Needs: No Transportation Needs  (12/26/2023)  Utilities: Not At Risk (12/26/2023)  Depression (PHQ2-9): Low Risk  (08/15/2022)  Financial Resource Strain: Not on File (06/07/2021)   Received from [facility]  Physical Activity: Not on File (06/07/2021)   Received from [facility]  Social Connections: Not on File (11/04/2022)   Received from [facility]  Stress: Not on File (06/07/2021)   Received from [facility]  Tobacco Use: Medium Risk (12/29/2023)    Readmission Risk Interventions    01/06/2024    3:16 PM  Readmission Risk Prevention Plan  Transportation Screening Complete  PCP or Specialist Appt within 5-7 Days Complete  Home Care Screening Complete  Medication Review (RN CM) Referral to Pharmacy

## 2024-01-06 NOTE — Plan of Care (Signed)
  Problem: Education: Goal: Ability to describe self-care measures that may prevent or decrease complications (Diabetes Survival Skills Education) will improve Outcome: Progressing Goal: Individualized Educational Video(s) Outcome: Progressing   Problem: Coping: Goal: Ability to adjust to condition or change in health will improve Outcome: Progressing   Problem: Health Behavior/Discharge Planning: Goal: Ability to identify and utilize available resources and services will improve Outcome: Progressing Goal: Ability to manage health-related needs will improve Outcome: Progressing   Problem: Metabolic: Goal: Ability to maintain appropriate glucose levels will improve Outcome: Progressing   Problem: Nutritional: Goal: Maintenance of adequate nutrition will improve Outcome: Progressing Goal: Progress toward achieving an optimal weight will improve Outcome: Progressing   Problem: Skin Integrity: Goal: Risk for impaired skin integrity will decrease Outcome: Progressing   Problem: Tissue Perfusion: Goal: Adequacy of tissue perfusion will improve Outcome: Progressing   Problem: Education: Goal: Knowledge of General Education information will improve Description: Including pain rating scale, medication(s)/side effects and non-pharmacologic comfort measures Outcome: Progressing   Problem: Health Behavior/Discharge Planning: Goal: Ability to manage health-related needs will improve Outcome: Progressing   Problem: Clinical Measurements: Goal: Ability to maintain clinical measurements within normal limits will improve Outcome: Progressing Goal: Will remain free from infection Outcome: Progressing Goal: Diagnostic test results will improve Outcome: Progressing Goal: Respiratory complications will improve Outcome: Progressing Goal: Cardiovascular complication will be avoided Outcome: Progressing   Problem: Activity: Goal: Risk for activity intolerance will decrease Outcome:  Progressing   Problem: Nutrition: Goal: Adequate nutrition will be maintained Outcome: Progressing   Problem: Coping: Goal: Level of anxiety will decrease Outcome: Progressing   Problem: Elimination: Goal: Will not experience complications related to bowel motility Outcome: Progressing Goal: Will not experience complications related to urinary retention Outcome: Progressing   Problem: Pain Managment: Goal: General experience of comfort will improve and/or be controlled Outcome: Progressing   Problem: Safety: Goal: Ability to remain free from injury will improve Outcome: Progressing   Problem: Skin Integrity: Goal: Risk for impaired skin integrity will decrease Outcome: Progressing

## 2024-01-06 NOTE — Social Work (Addendum)
 CSW acknowledged the consult-Patient has been in the hospital for a few weeks. Patient seems very discouraged about getting well and would like options for assistance.  CSW met with the patient at bedside and introduced CSW role. The patient was lying in bed and allowed CSW to speak with her. CSW explained the reason for the visit and that CSW was there to offer support. The patient expressed was experiencing normal sad emotions related to being away from her son and denied current mental health. The patient reported that she has experienced depression in the past related to trauma and denied experiencing similar symptoms at this time. The patient reported ongoing physical symptoms including pain, nausea, and vomiting after eating. The patient reported that these symptoms continue but no cause has been determined. The patient reported that she would like to understand why she continues to feel this way. CSW provided active listening and acknowledged her concerns. CSW inquired about the patients' support system during this time. The patient identified her significant other; whom she stated was banned from the hospital due to his behavior, her friends and siblings as primary sources during this time. CSW acknowledged the patient's support system. The patient declined additional support from CSW at this time and requested to see her RN. CSW agreed to notify RN about patient's request.    Nat Quiet, MSW, LCSW Clinical Social Worker  [phone] 01/06/2024  3:58 PM

## 2024-01-06 NOTE — Progress Notes (Signed)
 Told patient she had medication due and she states she has been throwing up and can't take medicine right now. Patient instructed ambulation was needed today to determine discharge. Patient states she can't go home due to not feeling good. Advised patient that the medication would help her feel better. Patient agrees and medication given to patient with drink. Upon returning to the room later it was determined that the patient had not taken any of the medication and was requesting pain medicine. I told the patient she had oral medication available to prepare for discharge. Patient refuses.

## 2024-01-06 NOTE — Progress Notes (Signed)
 PT Cancellation Note  Patient Details Name: Misty Walsh MRN: [id number] DOB: 06-Jul-1990   Cancelled Treatment:    Reason Eval/Treat Not Completed: Fatigue/lethargy limiting ability to participate. Pt reports just getting back in bed after amb in room independently. Declining hallway amb due to fatigue and nausea. PT to continue efforts to mobilize.   Erven Sari Shaker 01/06/2024, 11:28 AM

## 2024-01-06 NOTE — Progress Notes (Signed)
    Faculty Practice OB/GYN Attending Note  Subjective:  Reports vomiting overnight. Reports pain at site of drain. Feels unmotivated to get up and walk around. States she has been collecting her things in anticipation of discharge. Reports no acute concerns this morning    Objective:  Blood pressure (!) 141/82, pulse 88, temperature 98.2 F (36.8 C), temperature source Oral, resp. rate 18, height 5' 4 (1.626 m), weight 128.1 kg, last menstrual period 11/18/2023, SpO2 92%.  Gen: NAD Pysch: Mood appropriate, flat affect. Abdomen: obese, soft and non-tender, no rebound or guarding Midline incision- with dressing- clean and dry Minimal serous fluid noted in JP drain Ext: no edema or calf tenderness bilaterally  Assessment & Plan:  33 y.o. G1P0101 admitted due to nausea/vomiting.  1) GI-intractable nausea and vomiting. Intermittent now, on all oral medications with IV pain meds prn - Encourage diet - supportive measures  2) GYN- postop ex-Lap with abscess drainage (10/20)  - Dressing in place appears clean and dry, dressing changed this morning by RN - Meeting postoperative milestones - awaiting IR recs for drain remova;  3)ID - s/p IR drainage on 10/29 and additional drainage of subQ collection on 11/13  - Last imaging completed 12/29/2023, did note improvement in fluid collections  4) Heme/ PE - CBC remained stable - transitioned to eliquis  5) Multiple medical issues including HTN and type 2 DM, AKI - TRH following  DISPO: Continue to provide supportive care, request PT evaluation and SW in anticipation of discharge later  today or tomorrow  Rollo ONEIDA Bring, MD, FACOG Obstetrician & Gynecologist, [facility] for [employer], [facility]

## 2024-01-06 NOTE — Progress Notes (Signed)
 During assessment patient not answering questions and states she's aggravated and tired. Told patient I had medication due for her and she agrees to take medication. No issues.

## 2024-01-06 NOTE — Progress Notes (Signed)
 Checked back in on patient to find her laying in the bed and doctor had visited. Patient states this is the first time she has come to take care of me today. I verbalized to the doctor that I had been assisting the patient multiple times that day. Patient advised she needed to sit up in the bed instead of laying down so that her medication and food would digest better and not cause reflux or more vomiting. Patient states I'm too tired to sit up cause yall gave me all those percocet's last night. Reminded patient we had provided pain medication as requested by herself. Patient remains laying in her bed.

## 2024-01-06 NOTE — Progress Notes (Signed)
 [facility]                                                                               Misty Walsh, is a 33 y.o. female, DOB - [birth year], FMW:[id number] Admit date - 12/26/2023    Outpatient Primary MD for the patient is Buck Search, PA-C  LOS - 11  days    Brief summary    7F h/o HTN, HLD, DM2, PE and recent complicated course following pelvic abscesses and sepsis, status post exploratory laparotomy, multiple intra-abdominal fluid collections, some partially drained with refractory nausea vomiting c/b hypokalemia.  [facility] asked to consult on this patient.   CT chest, abdomen and pelvis showing Similar right adnexal abscess with percutaneous drain in place. Subcutaneous collection of fluid and air involving the right rectus abdominus muscle as has decreased slightly in size in the interval and is worrisome for an abscess.  Retro umbilical fluid collection is stable to minimally enlarged and may represent a seroma. Difficult to exclude abscess. Persistent moderate right hydronephrosis with decreased renal function, likely related to the aforementioned pelvic abscess.  TRH signed off on 11/16, but was requested to follow the patient for continued care for medical issues.   She underwent CT abdomen and pelvis on 11/17, reviewed results with the patient.  She continues to have mild to moderate hydronephrosis and hydroureter, and will need follow up with Urology on discharge. Her creatinine so far remains stable at 1.3.  Her pelvis abscesses are improving in size.  She is coming close to discharge and medications including anti emetics and pain meds changed to oral. But she reports vomiting today and refusing to take oral medications.  I feel she is drug seeking at this point as objectively she remains stable  with stable vital signs. She is alert, oriented and answering all questions appropriately, does not appear to be in any distress.   Assessment &  Plan   Intractable nausea and vomiting leading to persistent hypokalemia Suspect a component of gastroparesis . Slowly improving.   S/p EGD by gastroenterology.  Showing erythematous mucosa in the stomach likely reactive and related to vomiting and retching. Persistent nausea and vomiting probably from persistent abscesses seen on the repeat CT abdomen and pelvis. Since there has been no vomiting in the last 3 days, and some intermittent nausea . Recommend changing her anti emetics to  oral and PRN.     Abdominal wall abscess IR consulted for drain and fluid to be sent for analysis. Fluid sent and pending.  ID consulted for new abscesses, since cultures have been negative, will watch her off antibiotics.  ID  has signed off.    Pelvic abscesses  s/p pelvic drain placement 10/29 with Dr. Vanice, and abdominal wall fluid collection s/p aspiration on 11/13 by Dr. Hughes.  Right transgluteal drain on 10/29,.  S/p exp laparotomy on 10/20, abdominal wall wound connected wound VAC which was removed yesterday.  IR planning to do a JP drain injection study to see if it can be removed prior to discharge.     Hypertension Blood pressure parameters are elevated this morning.  At home she is on chlorthalidone , amlodipine and  Losartan.  Would hold on Losartan in view of renal insufficiency.  Restarted amlodipine, will start her on chlorthalidone  today.     Type 2 diabetes mellitus well controlled CBG'S A1c 6.3.  she  is on SSI while inpatient, she is on metformin as outpatient. Would hold off starting her on metformin view of her ongoing intermittent nausea for atleast another  4 weeks.  Once her abdominal symptoms have completely resolved. Recommending slowly restarting metformin with 500 mg daily as outpatient as per her PCP.    History of pulmonary embolism  on Lovenox injections currently. Since she is tolerating oral, transition to eliquis.     AKI likely secondary to poor oral  intake and patient was also found to have right moderate hydronephrosis from the pelvic abscesses Creatinine slowly improving. And has stabilized at 1.3.  Recommend checking US  renal in 4 weeks to evaluate for improvement in her hydronephrosis.  She would also benefit from ambulatory referral to Urology on discharge ( will be ordered ).  Recommend checking creatinine in 1 week on discharge.    Hypokalemia and hypomagnesemia Replaced, r repeat levels wnl.  Keep magnesium  >2 and K>4.     Normocytic anemia/anemia of acute illness/anemia of blood loss Baseline hemoglobin around 12, dropped to 10.4 to 9 . Currently stable around 8.9 Anemia panel reviewed  Transfuse to keep it greater than 7   Depression Flat affect.  Psychiatry consulted by primary.  She denies any suicidal ideation.    Morbid obesity.  Body mass index is 48.47 kg/m. Recommend outpatient follow up with PCP for lifestyle changes, weight loss.      Medications  Scheduled Meds:  amLODipine  10 mg Oral Daily   apixaban  5 mg Oral BID   Chlorhexidine Gluconate Cloth  6 each Topical Daily   feeding supplement  237 mL Oral Q24H   glycopyrrolate   1 mg Oral Q6H   insulin  aspart  0-20 Units Subcutaneous TID WC   insulin  aspart  0-5 Units Subcutaneous QHS   lidocaine-EPINEPHrine  (PF)  20 mL Infiltration Once   metoCLOPramide   10 mg Oral Q6H   pantoprazole   40 mg Oral BID   potassium chloride   40 mEq Oral BID   scopolamine   1 patch Transdermal Q72H   Continuous Infusions:   PRN Meds:.bisacodyl, diphenhydrAMINE , guaiFENesin , HYDROmorphone  (DILAUDID ) injection, LORazepam, menthol , metoprolol tartrate, ondansetron , oxyCODONE -acetaminophen , polyethylene glycol, prochlorperazine , sodium chloride  flush    Subjective:   Misty Walsh was seen and examined today.   As per the patient she has vomiting today. But she refused to take all her anti emetics.   Objective:   Vitals:   01/05/24 2321 01/06/24 0308  01/06/24 0816 01/06/24 1219  BP: (!) 152/94 139/69 (!) 141/82 (!) 161/98  Pulse: 91 87 88 86  Resp: 18 18 18 18   Temp: 98.5 F (36.9 C)  98.2 F (36.8 C) 98.3 F (36.8 C)  TempSrc: Oral  Oral Oral  SpO2: 94% 92%  97%  Weight:      Height:        Intake/Output Summary (Last 24 hours) at 01/06/2024 1819 Last data filed at 01/06/2024 0157 Gross per 24 hour  Intake --  Output 810 ml  Net -810 ml   Filed Weights   01/04/24 0210 01/05/24 0813  Weight: 126.6 kg 128.1 kg     Exam Patient seen and examined.  Alert and oriented, does not appear to be in distress.  CVS s1s2 heard. No JVD  Lungs clear  Abdomen soft non tender bs+ Neuro alert and oriented.    Data Reviewed:  I have personally reviewed following labs and imaging studies   CBC Lab Results  Component Value Date   WBC 4.8 01/04/2024   RBC 2.79 (L) 01/04/2024   HGB 8.7 (L) 01/04/2024   HCT 27.2 (L) 01/04/2024   MCV 97.5 01/04/2024   MCH 31.2 01/04/2024   PLT 223 01/04/2024   MCHC 32.0 01/04/2024   RDW 14.0 01/04/2024   LYMPHSABS 1.5 01/04/2024   MONOABS 0.7 01/04/2024   EOSABS 0.2 01/04/2024   BASOSABS 0.0 01/04/2024     Last metabolic panel Lab Results  Component Value Date   NA 135 01/06/2024   K 3.3 (L) 01/06/2024   CL 97 (L) 01/06/2024   CO2 26 01/06/2024   BUN <5 (L) 01/06/2024   CREATININE 1.30 (H) 01/06/2024   GLUCOSE 98 01/06/2024   GFRNONAA 55 (L) 01/06/2024   GFRAA 127 11/03/2019   CALCIUM  9.0 01/06/2024   PHOS 2.7 12/10/2023   PROT 6.3 (L) 01/06/2024   ALBUMIN 2.9 (L) 01/06/2024   LABGLOB 2.7 11/20/2022   AGRATIO 2.0 05/25/2020   BILITOT 0.6 01/06/2024   ALKPHOS 47 01/06/2024   AST 26 01/06/2024   ALT 22 01/06/2024   ANIONGAP 12 01/06/2024    CBG (last 3)  Recent Labs    01/04/24 2029 01/05/24 1139 01/05/24 2203  GLUCAP 100* 96 97      Coagulation Profile: No results for input(s): INR, PROTIME in the last 168 hours.   Radiology Studies: CT ABDOMEN PELVIS W  CONTRAST Result Date: 01/06/2024 CLINICAL DATA:  Intra-abdominal abscess EXAM: CT ABDOMEN AND PELVIS WITH CONTRAST TECHNIQUE: Multidetector CT imaging of the abdomen and pelvis was performed using the standard protocol following bolus administration of intravenous contrast. RADIATION DOSE REDUCTION: This exam was performed according to the departmental dose-optimization program which includes automated exposure control, adjustment of the mA and/or kV according to patient size and/or use of iterative reconstruction technique. CONTRAST:  75mL OMNIPAQUE  IOHEXOL  350 MG/ML SOLN COMPARISON:  12/29/2023 FINDINGS: Lower chest: Soft tissue nodules in the right breast are again identified and not substantially changed compared to 12/29/2023. 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 Mild cardiomegaly. Hepatobiliary: Layering density in the gallbladder could be from sludge or small gallstones. Otherwise unremarkable. Pancreas: Unremarkable Spleen: Unremarkable Adrenals/Urinary Tract: Adrenal glands unremarkable Heterogeneous right renal enhancement associated with mild to moderate right hydronephrosis, and right hydroureter extending down into the pelvis where the ureter becomes indistinct adjacent to the known regional right pelvic abscesses. Right renal parenchymal Hypoenhancement could be from obstruction or pyelonephritis. Stomach/Bowel: The rectum is in close proximity to the complex inflammatory process in the right hip anatomic pelvis. Vascular/Lymphatic: Unremarkable Reproductive: Indistinct margins of the uterus due to the adjacent right anatomic pelvic inflammatory process. The right ovary is likewise indistinct. Other: In the right hemipelvis we demonstrate an approximately 6.6 by 4.0 by 3.2 cm inflammatory process. Posteriorly within this process is the formed loop of a percutaneous drainage catheter with a trace amount of adjacent gas and fluid density. Further anteriorly, a 2.3 by 1.8 by 2.1 cm fluid  density is present within this process and may represent a separate abscess loculation. Adjacent inflammatory stranding tracks along the right paracolic gutter and right pelvic sidewall. Edema along the lower omentum noted. Within the anterior right rectus sheath and bulging into the subcutaneous tissues, a 4.0 by 2.9 by 3.0 cm fluid collection is present as on image 77 series 7. This is  reduced in size from previous and no longer has internal gas density. There is surrounding stranding in the subcutaneous tissues and tracking along the umbilicus. Small fluid collection along the inferior margin of the umbilicus measures about 1.9 by 2.8 by 1.8 cm on image 69 series 3, reduced in size from previous and no longer containing internal gas density. Small hematoma or injection site in the subcutaneous tissues of the right anterior abdomen on image 57 series 3. Musculoskeletal: No additional significant findings. IMPRESSION: 1. Persistent inflammatory process in the right hemipelvis with a formed loop of a percutaneous drainage catheter in place. The drained posterior abscess component appears reduced in volume compared to previous, and there continues to be a 2.3 by 1.8 by 2.1 cm fluid density anteriorly within this process which may represent a separate abscess loculation. 2. Mild to moderate right hydronephrosis and hydroureter extending down into the pelvis where the ureter becomes indistinct adjacent to the known regional right pelvic abscesses. Right renal parenchymal hypoenhancement could be from obstruction or pyelonephritis. 3. Reduced size of the fluid collection in the anterior right rectus sheath and bulging into the subcutaneous tissues. 4. Reduced size of the small fluid collection along the inferior margin of the umbilicus. 5. Layering density in the gallbladder could be from sludge or small gallstones. 6. Mild cardiomegaly. 7. Soft tissue nodules in the right breast are not substantially changed compared  to 12/29/2023. 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 Electronically Signed   By: Ryan Salvage M.D.   On: 01/06/2024 09:12        Elgie Butter M.D. [facility] 01/06/2024, 6:19 PM  Available via Epic secure chat 7am-7pm After 7 pm, please refer to night coverage provider listed on amion.

## 2024-01-06 NOTE — Progress Notes (Signed)
 Patient has been in the bed all day and hasn't eaten at this point. Advised patient I would make her some broth or bring atleast crackers. Patient refuses and states she wants IV pain medicine because her stomach hurt. Patient advised she missed her doses of medication that help her stomach feel better and that I would bring them back to her room and help her take them with applesauce. Patient refuses.

## 2024-01-06 NOTE — Progress Notes (Signed)
 Patient request IV pain medicine at this time. Advised patient that I had oral medication if she would let me help her try applesauce. Meanwhile the patients family member called to voice her concern that the hospital has done nothing to help her and now we wont give her any medicine. I spoke with family in depth and explained that patient would be discharging and that she had to attempt oral medication at this time but was refusing all efforts. Explain to family that patient had been offered physical therapy and refused their assistance, patient had been offered social work and also refused that assistance. Family voiced understanding and urged patient to take the help that was being offered and atleast try to get the oral medication with the applesauce. Patient able to get all earlier medications and pain medicine down with no issues and even ate crackers with juice. Patient did this all while laying down.

## 2024-01-07 ENCOUNTER — Inpatient Hospital Stay (HOSPITAL_COMMUNITY): Payer: MEDICAID

## 2024-01-07 ENCOUNTER — Other Ambulatory Visit (HOSPITAL_COMMUNITY): Payer: Self-pay

## 2024-01-07 DIAGNOSIS — M7989 Other specified soft tissue disorders: Secondary | ICD-10-CM

## 2024-01-07 DIAGNOSIS — F4329 Adjustment disorder with other symptoms: Secondary | ICD-10-CM

## 2024-01-07 DIAGNOSIS — R52 Pain, unspecified: Secondary | ICD-10-CM

## 2024-01-07 DIAGNOSIS — I82409 Acute embolism and thrombosis of unspecified deep veins of unspecified lower extremity: Secondary | ICD-10-CM

## 2024-01-07 DIAGNOSIS — F329 Major depressive disorder, single episode, unspecified: Secondary | ICD-10-CM

## 2024-01-07 HISTORY — PX: IR CV LINE INJECTION: IMG2294

## 2024-01-07 LAB — GLUCOSE, CAPILLARY
Glucose-Capillary: 94 mg/dL (ref 70–99)
Glucose-Capillary: 113 mg/dL — ABNORMAL HIGH (ref 70–99)

## 2024-01-07 MED ORDER — ONDANSETRON HCL 4 MG/2ML IJ SOLN
4.0000 mg | Freq: Once | INTRAMUSCULAR | Status: AC
  Administered 2024-01-07: 4 mg via INTRAVENOUS

## 2024-01-07 MED ORDER — SERTRALINE HCL 50 MG PO TABS
50.0000 mg | ORAL_TABLET | Freq: Every day | ORAL | 1 refills | Status: AC
  Filled 2024-01-07: qty 30, 30d supply, fill #0

## 2024-01-07 MED ORDER — BUSPIRONE HCL 5 MG PO TABS
5.0000 mg | ORAL_TABLET | Freq: Three times a day (TID) | ORAL | Status: DC
  Administered 2024-01-08: 5 mg via ORAL
  Filled 2024-01-07 (×5): qty 1

## 2024-01-07 MED ORDER — BUSPIRONE HCL 5 MG PO TABS
5.0000 mg | ORAL_TABLET | Freq: Three times a day (TID) | ORAL | 1 refills | Status: AC
  Filled 2024-01-07: qty 90, 30d supply, fill #0

## 2024-01-07 MED ORDER — ONDANSETRON HCL 4 MG/2ML IJ SOLN
INTRAMUSCULAR | Status: AC
  Filled 2024-01-07: qty 2

## 2024-01-07 MED ORDER — SENNOSIDES-DOCUSATE SODIUM 8.6-50 MG PO TABS
1.0000 | ORAL_TABLET | Freq: Two times a day (BID) | ORAL | Status: DC
  Administered 2024-01-07 – 2024-01-08 (×2): 1 via ORAL
  Filled 2024-01-07 (×3): qty 1

## 2024-01-07 MED ORDER — SERTRALINE HCL 50 MG PO TABS
25.0000 mg | ORAL_TABLET | Freq: Every day | ORAL | Status: DC
  Administered 2024-01-08: 25 mg via ORAL
  Filled 2024-01-07 (×2): qty 1

## 2024-01-07 MED ORDER — IOHEXOL 300 MG/ML  SOLN
50.0000 mL | Freq: Once | INTRAMUSCULAR | Status: AC | PRN
  Administered 2024-01-07: 10 mL

## 2024-01-07 MED ORDER — LOSARTAN POTASSIUM 25 MG PO TABS
50.0000 mg | ORAL_TABLET | Freq: Every day | ORAL | Status: DC
  Administered 2024-01-07 – 2024-01-08 (×2): 50 mg via ORAL
  Filled 2024-01-07 (×2): qty 2

## 2024-01-07 NOTE — Progress Notes (Signed)
 PT Cancellation Note  Patient Details Name: Misty Walsh MRN: [id number] DOB: 05-27-1990   Cancelled Treatment:    Reason Eval/Treat Not Completed: (P) Patient at procedure or test/unavailable. Will plan to follow-up later as time permits.   Theo Ferretti, PT, DPT Acute Rehabilitation Services  Office: [phone]    Theo CHRISTELLA Ferretti 01/07/2024, 8:18 AM

## 2024-01-07 NOTE — TOC Progression Note (Signed)
 Transition of Care [facility]) - Progression Note    Patient Details  Name: Misty Walsh MRN: [id number] Date of Birth: 1991-01-20  [facility] CM/SW Contact  Charlene Julian Daring, RN Phone [phone] 01/07/2024, 3:28 PM  Clinical Narrative:     CM received message from MD regarding rehab consult. CM spoke to patient regarding home options and possible CIR. Patient is open to going to CIR. PT/OT evaluations done and awaiting rehab evaluations.  CM spoke to patient and father of her child in room on speaker and he is caring for her 88 year old son that is disabled and shared he plans to help care for him while patient is in hospital and recovering. Patient has bariatric wheelchair DME- that was provided by [employer] - last admission and also has wound care supplies through [employer]/home peel/stick . Per MD that is no longer needed vac at discharge.  Drain dc'd today and CM will follow. Medicaid application was submitted on 11/6- information provided by [employer] and is pending.  Match will be provided for any dc medications for home.     Barriers to Discharge: Financial Resources, Continued Medical Work up      Social Drivers of Health (SDOH) Interventions SDOH Screenings   Food Insecurity: No Food Insecurity (12/26/2023)  Housing: Low Risk  (12/26/2023)  Transportation Needs: No Transportation Needs (12/26/2023)  Utilities: Not At Risk (12/26/2023)  Depression (PHQ2-9): Low Risk  (08/15/2022)  Financial Resource Strain: Not on File (06/07/2021)   Received from [facility]  Physical Activity: Not on File (06/07/2021)   Received [facility]  Social Connections: Not on File (11/04/2022)   Received from [facility]  Stress: Not on File (06/07/2021)   Received from [facility]  Tobacco Use: Medium Risk (12/29/2023)    Readmission Risk Interventions    01/06/2024    3:16 PM  Readmission Risk Prevention Plan  Transportation Screening Complete  PCP or Specialist Appt within 5-7  Days Complete  Home Care Screening Complete  Medication Review (RN CM) Referral to Pharmacy

## 2024-01-07 NOTE — Plan of Care (Signed)
  Problem: Education: Goal: Ability to describe self-care measures that may prevent or decrease complications (Diabetes Survival Skills Education) will improve Outcome: Progressing Goal: Individualized Educational Video(s) Outcome: Progressing   Problem: Coping: Goal: Ability to adjust to condition or change in health will improve Outcome: Progressing   Problem: Health Behavior/Discharge Planning: Goal: Ability to identify and utilize available resources and services will improve Outcome: Progressing Goal: Ability to manage health-related needs will improve Outcome: Progressing   Problem: Metabolic: Goal: Ability to maintain appropriate glucose levels will improve Outcome: Progressing   Problem: Nutritional: Goal: Maintenance of adequate nutrition will improve Outcome: Progressing Goal: Progress toward achieving an optimal weight will improve Outcome: Progressing   Problem: Skin Integrity: Goal: Risk for impaired skin integrity will decrease Outcome: Progressing   Problem: Tissue Perfusion: Goal: Adequacy of tissue perfusion will improve Outcome: Progressing   Problem: Education: Goal: Knowledge of General Education information will improve Description: Including pain rating scale, medication(s)/side effects and non-pharmacologic comfort measures Outcome: Progressing   Problem: Health Behavior/Discharge Planning: Goal: Ability to manage health-related needs will improve Outcome: Progressing   Problem: Clinical Measurements: Goal: Ability to maintain clinical measurements within normal limits will improve Outcome: Progressing Goal: Will remain free from infection Outcome: Progressing Goal: Diagnostic test results will improve Outcome: Progressing Goal: Respiratory complications will improve Outcome: Progressing Goal: Cardiovascular complication will be avoided Outcome: Progressing   Problem: Activity: Goal: Risk for activity intolerance will decrease Outcome:  Progressing   Problem: Nutrition: Goal: Adequate nutrition will be maintained Outcome: Progressing   Problem: Coping: Goal: Level of anxiety will decrease Outcome: Progressing   Problem: Elimination: Goal: Will not experience complications related to bowel motility Outcome: Progressing Goal: Will not experience complications related to urinary retention Outcome: Progressing   Problem: Pain Managment: Goal: General experience of comfort will improve and/or be controlled Outcome: Progressing   Problem: Safety: Goal: Ability to remain free from injury will improve Outcome: Progressing   Problem: Skin Integrity: Goal: Risk for impaired skin integrity will decrease Outcome: Progressing

## 2024-01-07 NOTE — Progress Notes (Addendum)
  Inpatient Rehab Admissions Coordinator :  Per therapy recommendations patient was screened for CIR candidacy by Heron Leavell RN MSN. Patient does not appear to demonstrate the need for [facility] Rehabilitation /CIR admit with last PT session today supervision 180 feet with RW. Min assist with adls for OT today. CIR beds very limited into next week.  I will not place a Rehab Consult. Recommend other Rehab Venues to be pursued. Please contact me with any questions.  Heron Leavell RN MSN Admissions Coordinator [phone]

## 2024-01-07 NOTE — Evaluation (Signed)
 Occupational Therapy Evaluation Patient Details Name: Misty Walsh MRN: [id number] DOB: 1990-09-06 Today's Date: 01/07/2024   History of Present Illness   33 yo F adm 12/26/23 with intractable N/V. 11/10 EGD. 11/13 IR abscess aspiration. Drain removed 11/19. PMhx:10/13-11/4 admission for pelvic abscess s/p ex lap 12/08/23. 10/22 PE and bil DVT. 10/29 Drain placed by IR. T2DM, morbid obesity, HTN     Clinical Impressions PTA Misty Walsh lives independently with her 42 yo son Misty Walsh), who requires total care with ADL tasks and mobility. States she enjoys taking care of Misty Walsh and hanging out with her girls. Due to below listed deficits, pt requires S with mobility @ RW level and min A with ADL tasks. Pt states she feels anxious about n/v since she has vomited so much. States anything can trigger her n/v. Given decline in functional status, recommend intensive inpatient follow-up therapy, >3 hours/day to maximize functional level of independence and improve her ability to return as a caregiver to her son. Acute OT will follow.      If plan is discharge home, recommend the following:   A little help with walking and/or transfers;A little help with bathing/dressing/bathroom;Assistance with cooking/housework;Assist for transportation;Help with stairs or ramp for entrance     Functional Status Assessment   Patient has had a recent decline in their functional status and demonstrates the ability to make significant improvements in function in a reasonable and predictable amount of time.     Equipment Recommendations   BSC/3in1     Recommendations for Other Services   Rehab consult     Precautions/Restrictions   Precautions Precautions: Fall Recall of Precautions/Restrictions: Intact Required Braces or Orthoses: Other Brace Other Brace: abdominal binder Restrictions Weight Bearing Restrictions Per Provider Order: No     Mobility Bed Mobility Overal bed mobility:  Modified Independent Bed Mobility: Sit to Supine, Supine to Sit     Supine to sit: Modified independent (Device/Increase time), HOB elevated - rolling used Sit to supine: Modified independent (Device/Increase time), HOB elevated - rolling used   General bed mobility comments: Increased time but capable of mod I with HOB mildly elevated    Transfers Overall transfer level: Needs assistance Equipment used: None Transfers: Sit to/from Stand Sit to Stand: Supervision           General transfer comment: Pt able to stand from EOB without LOB or assistance, supervision for safety; cues for hand placement; pt states she is fearful of falling       Balance Overall balance assessment: Mild deficits observed, not formally tested                                         ADL either performed or assessed with clinical judgement   ADL Overall ADL's : Needs assistance/impaired Eating/Feeding: Independent   Grooming: Modified independent   Upper Body Bathing: Set up;Sitting   Lower Body Bathing: Minimal assistance;Sit to/from stand   Upper Body Dressing : Sitting;Set up   Lower Body Dressing: Minimal assistance;Sit to/from stand   Toilet Transfer: Supervision/safety   Toileting- Architect and Hygiene: Modified independent       Functional mobility during ADLs: Supervision/safety;Rolling walker (2 wheels);Cueing for safety;Cueing for sequencing  Will benefit from AE for LB ADL     Vision Baseline Vision/History: 0 No visual deficits       Perception  Praxis         Pertinent Vitals/Pain Pain Assessment Pain Assessment: 0-10 Pain Score: 8  Pain Location: abdomen, R leg - upper (reports she thinks she slept funny on her R leg) Pain Descriptors / Indicators: Discomfort, Grimacing, Guarding Pain Intervention(s): Limited activity within patient's tolerance     Extremity/Trunk Assessment Upper Extremity Assessment Upper Extremity  Assessment: Overall WFL for tasks assessed   Lower Extremity Assessment Lower Extremity Assessment: Defer to PT evaluation   Cervical / Trunk Assessment Cervical / Trunk Assessment: Other exceptions Cervical / Trunk Exceptions: body habitus   Communication Communication Communication: No apparent difficulties   Cognition Arousal: Alert Behavior During Therapy: Flat affect (poor eye contact initially; however improved as session progressed) Would most likely benefit from grounding and anxiety management techniques Cognition: No apparent impairments             OT - Cognition Comments: slow/delayed processing noted                 Following commands: Intact       Cueing  General Comments   Cueing Techniques: Verbal cues      Exercises     Shoulder Instructions      Home Living Family/patient expects to be discharged to:: Private residence Living Arrangements: Children;Other (Comment) (son's father has moved in to take care of their child)   Type of Home: Apartment Home Access: Level entry     Home Layout: One level     Bathroom Shower/Tub: Tub/shower unit;Walk-in shower   Bathroom Toilet: Standard     Home Equipment: Agricultural Consultant (2 wheels)          Prior Functioning/Environment Prior Level of Function : Independent/Modified Independent;Driving (takes care of her 34 yo special needs son Misty Walsh) who is total care; has to physically transfer him to his wc/assist with short distance walking)                    OT Problem List: Decreased activity tolerance;Decreased range of motion;Decreased knowledge of use of DME or AE;Obesity;Pain   OT Treatment/Interventions: Self-care/ADL training;Therapeutic exercise;DME and/or AE instruction;Therapeutic activities;Patient/family education      OT Goals(Current goals can be found in the care plan section)   Acute Rehab OT Goals Patient Stated Goal: stop throwing up OT Goal Formulation: With  patient Time For Goal Achievement: 01/21/24 Potential to Achieve Goals: Good   OT Frequency:  Min 2X/week    Co-evaluation              AM-PAC OT 6 Clicks Daily Activity     Outcome Measure Help from another person eating meals?: None Help from another person taking care of personal grooming?: None Help from another person toileting, which includes using toliet, bedpan, or urinal?: A Little Help from another person bathing (including washing, rinsing, drying)?: A Little Help from another person to put on and taking off regular upper body clothing?: A Little Help from another person to put on and taking off regular lower body clothing?: A Little 6 Click Score: 20   End of Session Equipment Utilized During Treatment: Rolling walker (2 wheels) Nurse Communication: Mobility status  Activity Tolerance: Patient tolerated treatment well Patient left: in bed;with call bell/phone within reach  OT Visit Diagnosis: Unsteadiness on feet (R26.81);Muscle weakness (generalized) (M62.81);Pain Pain - Right/Left: Right Pain - part of body: Leg (abdomen)                Time: 8581-8559 OT Time  Calculation (min): 22 min Charges:  OT General Charges $OT Visit: 1 Visit OT Evaluation $OT Eval Low Complexity: 1 Low  Wilfrid Hyser, OT/L   Acute OT Clinical Specialist Acute Rehabilitation Services Pager [phone] Office [phone]   [facility] 01/07/2024, 2:56 PM

## 2024-01-07 NOTE — Progress Notes (Signed)
 PROGRESS NOTE  Misty Walsh  DOB: 17-Dec-1990  PCP: Buck Search, PA-C FMW:[id number]  DOA: 12/26/2023  LOS: 12 days  Hospital Day: 13  Subjective: Patient was seen and examined this morning. Young morbidly obese African-American female.  Lying on bed.  Retching.  Spitting out.  States she has not been able to tolerate any oral intake.  Has not had a bowel movement in 4 days. Afebrile, heart rate in 80s and 90s, blood pressure in 140s to 160s, breathing on room air No labs this morning   Brief narrative: Misty Walsh is a 33 y.o. female with PMH significant for morbid obesity, DM2, HTN, HLD, PE, anxiety/depression Recently hospitalized under GYN service from 10/13 to 11/4 in which she had complicated course following pelvic abscesses and sepsis, s/p exploratory laparotomy, multiple intra-abdominal fluid collections, some partially drained.  11/7, returned back to the ED after 3 days of discharge with refractory nausea vomiting c/b hypokalemia. Initial CT scan was worrisome for an abscess Admitted to GYN service TRH following for consult  Most recent CT abdomen/pelvis from 11/17 showed  1. Persistent inflammatory process in the right hemipelvis with a formed loop of a percutaneous drainage catheter in place. The drained posterior abscess component appears reduced in volume compared to previous, and there continues to be a 2.3 by 1.8 by 2.1 cm fluid density anteriorly within this process which may represent a separate abscess loculation. 2. Mild to moderate right hydronephrosis and hydroureter extending down into the pelvis where the ureter becomes indistinct adjacent to the known regional right pelvic abscesses. Right renal parenchymal hypoenhancement could be from obstruction or pyelonephritis. 3. Reduced size of the fluid collection in the anterior right rectus sheath and bulging into the subcutaneous tissues. 4. Reduced size of the small fluid collection along the inferior  margin of the umbilicus. 5. Layering density in the gallbladder could be from sludge or small gallstones.  Assessment and plan: Intractable nausea and vomiting Symptoms gradually improving. S/p EGD by gastroenterology.  Showed erythematous mucosa in the stomach likely reactive and related to vomiting and retching. Continue to persistent nausea.  Unclear if she has been throwing up as well.  Has not had a bowel movement in 4 days.  Symptoms likely from persistent abscesses seen on the repeat CT abdomen and pelvis. Currently on as needed antiemetics. Stop IV Dilaudid  On regular diet but says she has not been able to tolerate intake.    Constipation Reports no BM in 4 days Start Senokot 1 tab twice daily, continue MiraLAX  as needed.  Can try 1 dose of enema today   Abdominal wall abscess IR consulted for drain and fluid to be sent for analysis.  ID consulted for new abscesses, since cultures have been negative, will watch her off antibiotics.  ID  has signed off.    Pelvic abscesses  s/p pelvic drain placement 10/29 with Dr. Vanice, and abdominal wall fluid collection s/p aspiration on 11/13 by Dr. Hughes.  Right transgluteal drain on 10/29,. S/p exp laparotomy on 10/20, abdominal wall wound connected wound VAC which was removed few days ago..  11/19, JP drain removed by IR.  AKI  Mild to moderate right hydronephrosis/hydroureter likely secondary to poor oral intake and patient was also found to have right moderate hydronephrosis from the pelvic abscesses Creatinine slowly improving. And has stabilized at 1.3.  Recommend checking US  renal in 4 weeks to evaluate for improvement in her hydronephrosis.  She would also benefit from ambulatory referral to Urology  on discharge.  Ambulatory referral has been requested Recommend checking creatinine in 1 week on discharge.  Recent Labs    12/26/23 1447 12/27/23 0917 12/28/23 1221 12/29/23 0428 12/30/23 0425 12/31/23 0446 01/01/24 0513  01/02/24 0613 01/04/24 0515 01/06/24 0530  BUN 10 12 9 7 6 6  5* <5* 5* <5*  CREATININE 1.12* 1.12* 1.39* 1.45* 1.41* 1.27* 1.27* 1.32* 1.36* 1.30*    Hypokalemia/hypomagnesemia Electrolyte levels were monitored and replaced. Repeat labs in a.m. Recent Labs  Lab 01/01/24 0513 01/02/24 0613 01/04/24 0515 01/06/24 0530  K 3.2* 3.6 3.5 3.3*  MG 2.1 1.8  --   --    Prediabetes A1c 6.3 on 12/02/2023 PTA meds-metformin Currently on SSI/Accu-Cheks Recent Labs  Lab 01/04/24 2029 01/05/24 1139 01/05/24 2203 01/06/24 2229 01/07/24 1017  GLUCAP 100* 96 97 94 113*   Hypertension Blood pressure elevated to 140s and 160s  PTA meds- chlorthalidone , amlodipine and Losartan.  Currently on amlodipine 10 mg daily.  Resume losartan 50 mg daily today    H/o pulmonary embolism  Currently on Eliquis   Acute anemia Baseline hemoglobin around 12. Hemoglobin dropped but stabilized close to 9.  Acute drop in hemoglobin likely due to acute illness. Anemia panel reviewed  Transfuse to keep it greater than 7 Recent Labs    12/11/23 0052 12/11/23 0057 12/11/23 0710 12/12/23 0437 12/31/23 0446 01/01/24 0513 01/02/24 0613 01/03/24 0823 01/04/24 0515  HGB  --    < >  --    < > 9.0* 9.1* 8.9* 8.9* 8.7*  MCV  --    < >  --    < > 97.5 96.6 97.2 98.2 97.5  VITAMINB12  --   --  544  --   --   --   --   --   --   FOLATE 8.8  --   --   --   --   --   --   --   --   FERRITIN 688*  --   --   --   --   --   --   --   --   TIBC 207*  --   --   --   --   --   --   --   --   IRON 67  --   --   --   --   --   --   --   --   RETICCTPCT 2.5  --   --   --   --   --   --   --   --    < > = values in this interval not displayed.   Depression Flat affect.  Psychiatry consult was offered by primary but apparently patient refused She denies any suicidal ideation.    Morbid Obesity  Body mass index is 48.47 kg/m. Patient has been advised to make an attempt to improve diet and exercise patterns to aid  in weight loss.  Mobility: Seen by PT  PT Orders: Active   PT Follow up Rec: No Pt Follow Up11/19/2025 1034    Goals of care   Code Status: Full Code     DVT prophylaxis:   apixaban (ELIQUIS) tablet 5 mg   Antimicrobials: None Fluid: None Family Communication: None at bedside  Status: Inpatient Level of care:  Med-Surg   Patient is from: Home Anticipated d/c to: Home after nausea, vomiting, constipation improves     Diet:  Diet Order  Diet regular Room service appropriate? Yes; Fluid consistency: Thin  Diet effective now                   Scheduled Meds:  amLODipine  10 mg Oral Daily   apixaban  5 mg Oral BID   Chlorhexidine Gluconate Cloth  6 each Topical Daily   feeding supplement  237 mL Oral Q24H   glycopyrrolate   1 mg Oral Q6H   insulin  aspart  0-20 Units Subcutaneous TID WC   insulin  aspart  0-5 Units Subcutaneous QHS   lidocaine-EPINEPHrine  (PF)  20 mL Infiltration Once   losartan  50 mg Oral Daily   metoCLOPramide   10 mg Oral Q6H   pantoprazole   40 mg Oral BID   scopolamine   1 patch Transdermal Q72H   senna-docusate  1 tablet Oral BID    PRN meds: bisacodyl, diphenhydrAMINE , guaiFENesin , LORazepam, menthol , metoprolol tartrate, ondansetron , oxyCODONE -acetaminophen , polyethylene glycol, prochlorperazine , sodium chloride  flush   Infusions:    Antimicrobials: Anti-infectives (From admission, onward)    None       Objective: Vitals:   01/07/24 0500 01/07/24 1021  BP: (!) 147/68 (!) 160/85  Pulse: 81 84  Resp: 18 20  Temp: 98.5 F (36.9 C) 98.5 F (36.9 C)  SpO2:  96%    Intake/Output Summary (Last 24 hours) at 01/07/2024 1248 Last data filed at 01/07/2024 0900 Gross per 24 hour  Intake --  Output 152.5 ml  Net -152.5 ml   Filed Weights   01/04/24 0210 01/05/24 0813  Weight: 126.6 kg 128.1 kg   Weight change:  Body mass index is 48.47 kg/m.   Physical Exam: General exam: Pleasant, morbidly obese young  Caucasian female. Skin: No rashes, lesions or ulcers. HEENT: Atraumatic, normocephalic, no obvious bleeding Lungs: Clear to auscultation bilaterally,  CVS: S1, S2, no murmur,   GI/Abd: Soft, mild diffuse tenderness but inconsistent on repeated exam, nondistended, bowel sound present,   CNS: Alert, awake, oriented x 3 Psychiatry: Sad affect Extremities: No pedal edema, no calf tenderness,   Data Review: I have personally reviewed the laboratory data and studies available.  F/u labs ordered Unresulted Labs (From admission, onward)     Start     Ordered   01/08/24 0500  Basic metabolic panel with GFR  Tomorrow morning,   R       Question:  Specimen collection method  Answer:  IV Team=IV Team collect   01/07/24 1247   01/08/24 0500  CBC with Differential/Platelet  Tomorrow morning,   R       Question:  Specimen collection method  Answer:  IV Team=IV Team collect   01/07/24 1247            Signed, Chapman Rota, MD [facility] 01/07/2024

## 2024-01-07 NOTE — Progress Notes (Signed)
 Physical Therapy Treatment Patient Details Name: Misty Walsh MRN: [id number] DOB: 01/14/1991 Today's Date: 01/07/2024   History of Present Illness 33 yo F adm 12/26/23 with intractable N/V. 11/10 EGD. 11/13 IR abscess aspiration. Drain removed 11/19. PMhx:10/13-11/4 admission for pelvic abscess s/p ex lap 12/08/23. 10/22 PE and bil DVT. 10/29 Drain placed by IR. T2DM, morbid obesity, HTN    PT Comments  The pt reports nausea when standing and ambulating, limiting her activity tolerance. While the pt is capable of ambulating without a RW, she gets anxious when trying to ambulate without it in the halls but is comfortable without using it in her room, thereby seeming to be limited by some anxiety. She does display deficits in endurance/activity tolerance and has had several medical complications recently. At baseline, she is independent without need for an AD and cares for her 57 y.o. son who has special needs. She is deconditioned and has demonstrated a functional decline as she now is using a RW for longer distance mobility. She will likely progress quickly as she mobilizes more frequently and with intensive rehab, > 3 hours/day. Thus, updated d/c recs to reflect this d/c location to address her functional decline and deficits and to manage her medically. Educated pt on the importance of sitting up rather than laying down and on taking >/= x3 longer walks/day to make functional progress and prevent further complications. She verbalized understanding. Will continue to follow acutely.      If plan is discharge home, recommend the following: Assistance with cooking/housework;Assist for transportation   Can travel by private vehicle        Equipment Recommendations  None recommended by PT    Recommendations for Other Services       Precautions / Restrictions Precautions Precautions: None Recall of Precautions/Restrictions: Intact Restrictions Weight Bearing Restrictions Per Provider  Order: No     Mobility  Bed Mobility Overal bed mobility: Modified Independent Bed Mobility: Sit to Supine, Supine to Sit     Supine to sit: Modified independent (Device/Increase time), HOB elevated Sit to supine: Modified independent (Device/Increase time), HOB elevated   General bed mobility comments: Increased time but capable of mod I with HOB mildly elevated    Transfers Overall transfer level: Needs assistance Equipment used: None Transfers: Sit to/from Stand Sit to Stand: Supervision           General transfer comment: Pt able to stand from EOB without LOB or assistance, supervision for safety    Ambulation/Gait Ambulation/Gait assistance: Supervision Gait Distance (Feet): 180 Feet (x2 bouts of ~5 ft > ~180 ft) Assistive device: Rolling walker (2 wheels), None Gait Pattern/deviations: Step-through pattern, Wide base of support, Decreased stride length Gait velocity: reduced Gait velocity interpretation: <1.8 ft/sec, indicate of risk for recurrent falls   General Gait Details: Pt ambulated in the room without UE support, no LOB noted. When pt entered the hall she reported feeling anxious about not using a RW and began to lean on the wall for support, requesting her RW. Provided pt with her RW again and pt was steady but ambulated slowly. Supervision for safety throughout. Pt declined ambulating further than ~180 ft due to nausea with mobility.   Stairs             Wheelchair Mobility     Tilt Bed    Modified Rankin (Stroke Patients Only)       Balance Overall balance assessment: Mild deficits observed, not formally tested  Communication Communication Communication: No apparent difficulties  Cognition Arousal: Alert Behavior During Therapy: Flat affect   PT - Cognitive impairments: No apparent impairments                         Following commands: Intact      Cueing  Cueing Techniques: Verbal cues  Exercises      General Comments General comments (skin integrity, edema, etc.): Educated pt on the importance of sitting up rather than laying down and on taking >/= x3 longer walks/day to make functional progress and prevent further complications. She verbalized understanding.      Pertinent Vitals/Pain Pain Assessment Pain Assessment: Faces Faces Pain Scale: Hurts little more Pain Location: abdomen, R leg (reports she thinks she slept funny on her R leg) Pain Descriptors / Indicators: Discomfort, Grimacing, Guarding Pain Intervention(s): Limited activity within patient's tolerance, Monitored during session, Repositioned    Home Living                          Prior Function            PT Goals (current goals can now be found in the care plan section) Acute Rehab PT Goals Patient Stated Goal: independence PT Goal Formulation: With patient Time For Goal Achievement: 01/17/24 Potential to Achieve Goals: Good Progress towards PT goals: Progressing toward goals    Frequency    Min 1X/week      PT Plan      Co-evaluation              AM-PAC PT 6 Clicks Mobility   Outcome Measure  Help needed turning from your back to your side while in a flat bed without using bedrails?: None Help needed moving from lying on your back to sitting on the side of a flat bed without using bedrails?: None Help needed moving to and from a bed to a chair (including a wheelchair)?: A Little Help needed standing up from a chair using your arms (e.g., wheelchair or bedside chair)?: A Little Help needed to walk in hospital room?: A Little Help needed climbing 3-5 steps with a railing? : A Little 6 Click Score: 20    End of Session   Activity Tolerance: Patient tolerated treatment well Patient left: in bed;with call bell/phone within reach Nurse Communication: Mobility status PT Visit Diagnosis: Muscle weakness (generalized)  (M62.81);Difficulty in walking, not elsewhere classified (R26.2);Pain;Other abnormalities of gait and mobility (R26.89)     Time: 8974-8955 PT Time Calculation (min) (ACUTE ONLY): 19 min  Charges:    $Therapeutic Activity: 8-22 mins PT General Charges $$ ACUTE PT VISIT: 1 Visit                     Theo Ferretti, PT, DPT Acute Rehabilitation Services  Office: [phone]    Theo CHRISTELLA Ferretti 01/07/2024, 11:11 AM

## 2024-01-07 NOTE — Consult Note (Signed)
 [facility] Psychiatric Consult Initial  Patient Name: .Misty Walsh  MRN: [id number]  DOB: [birth year]  Consult Order details:  Orders (From admission, onward)     Start     Ordered   01/07/24 1322  IP CONSULT TO PSYCHIATRY       Ordering Provider: Abigail Rollo DASEN, MD  Provider:  (Not yet assigned)  Question Answer Comment  Location [facility]   Reason for Consult? depression, prolonged hospitalization, anhedonia      01/07/24 1321             Mode of Visit: In person    Psychiatry Consult Evaluation  Service Date: January 07, 2024 LOS:  LOS: 12 days  Chief Complaint : They said I need to talk to someone in order to go home!  Primary Psychiatric Diagnoses  MDD moderate 2.  Adjustment disorder 3.    Assessment  Misty Walsh is a 33 y.o. female admitted: Medicallyfor 12/26/2023  1:52 PM for IR abscess. She carries the psychiatric diagnoses of MDD and GAD and has a past medical history of  Obesity, Intractable n/v, hypertension, T2DM.   Her current presentation of depressed and anxious mood in the context of prolonged hospitalization, functional decline, and significant caregiving stressors is most consistent with Major Depressive Disorder, recurrent, moderate to severe, with comorbid Generalized Anxiety Disorder. She meets criteria for Major Depressive Disorder based on persistent low mood, anhedonia, fatigue, impaired concentration, withdrawal, feelings of being overwhelmed, and functional impairment, and meets criteria for Generalized Anxiety Disorder based on excessive and persistent worry, somatic anxiety around nausea and eating, and distress associated with mobility and pain. Previous outpatient psychotropic medications include Zoloft  and Buspirone , and historically she has had a positive therapeutic response to these medications prior to self-discontinuation. She was not compliant with medications prior to admission as evidenced by stopping them  once symptoms improved and not engaging in ongoing outpatient psychiatric care. On initial examination, patient appeared flat, withdrawn, fatigued, and vague in her responses, though cooperative, open to treatment, and acknowledging the return of depressive and anxiety symptoms. Please see plan below for detailed recommendations.  The patient was seen and assessed by the psychiatric provider following a consult for severe depressed mood in the setting of a prolonged and medically complex hospitalization. She has been admitted for several weeks due to complications from a pelvic floor abscess that required aspiration and subsequent drain placement. The patient lives independently with her 61 year old son, who requires total assistance with activities of daily living and mobility needs; her significant other, who is also the child's father, recently moved in to support caregiving responsibilities. On evaluation, the patient appeared flat, withdrawn, and fatigued, though she remained cooperative and open to psychiatric assessment. She stated she agreed to the consult because she was advised she needed to speak with psychiatry in order to be considered for discharge, but she acknowledged a personal history of depression and anxiety. She previously experienced meaningful symptom relief with Zoloft  and Buspirone  but discontinued both medications after a few months once she began feeling better, noting that her symptoms remained stable for a period before gradually worsening prior to this admission. Throughout much of the interview, her responses were vague, often focusing on her lengthy hospitalization, functional limitations, and inability to care for her son rather than directly addressing symptom-specific questions. She did endorse significant anxiety, including excessive worry about nausea and vomiting, fear surrounding eating and drinking, and heightened distress with ambulation or pain. She expressed  willingness  to engage in treatment despite concerns about being uninsured and having limited coverage for outpatient services. She denies any history of suicide attempts, self-harm behaviors, or substance use concerns. Family history is limited; she describes her father as emotionally distant and reports that her mother passed away when she was 65, leaving her with minimal insight into extended family psychiatric history. She denies any learning disabilities, neurodevelopmental disorders, or cognitive impairment diagnoses. Overall, the patient presents with a depressed and anxious mood in the context of prolonged hospitalization, significant caregiving stressors, chronic medical illness, and reduced psychosocial supports.  Diagnoses:  Active Hospital problems: Principal Problem:   Intractable nausea and vomiting Active Problems:   Hyperlipemia   Primary hypertension   Type 2 diabetes mellitus without complication, without long-term current use of insulin  (HCC)   Tubo-ovarian abscess   AKI (acute kidney injury)   S/P exploratory laparotomy   Pulmonary embolism (HCC)   Hypokalemia   Open wound anterior abdominal wall   Gastric erythema    Plan   ## Psychiatric Medication Recommendations:  Start zoloft  25mg  po daily x 2 days, then increase zoloft  50mg  po daily until seen by PCP.  -Start buspar  5mg  po TID  ## Medical Decision Making Capacity: Not specifically addressed in this encounter  ## Further Work-up:  -- Defer to primary TSH, B12, folate, EKG, While pt on Qtc prolonging medications, please monitor & replete K+ to 4 and Mg2+ to 2, or U/A -- most recent EKG on 12/31/2023 had QtC of 449 -- Pertinent labwork reviewed earlier this admission includes: hypoalbumenia 2.9, GFR 55, creatnine 1.30, Potassium 3.3   ## Disposition:-- There are no psychiatric contraindications to discharge at this time  ## Behavioral / Environmental: - No specific recommendations at this time.     ## Safety and  Observation Level:  - Based on my clinical evaluation, I estimate the patient to be at low risk of self harm in the current setting. - At this time, we recommend  routine. This decision is based on my review of the chart including patient's history and current presentation, interview of the patient, mental status examination, and consideration of suicide risk including evaluating suicidal ideation, plan, intent, suicidal or self-harm behaviors, risk factors, and protective factors. This judgment is based on our ability to directly address suicide risk, implement suicide prevention strategies, and develop a safety plan while the patient is in the clinical setting. Please contact our team if there is a concern that risk level has changed.  CSSR Risk Category:C-SSRS RISK CATEGORY: No Risk  Suicide Risk Assessment: Patient has following modifiable risk factors for suicide: under treated depression , social isolation, recklessness, lack of access to outpatient mental health resources, active mental illness (to encompass adhd, tbi, mania, psychosis, trauma reaction), current symptoms: anxiety/panic, insomnia, impulsivity, anhedonia, hopelessness, triggering events, and pain, medical illness (ie new dx of cancer), which we are addressing by medication management. Patient has following non-modifiable or demographic risk factors for suicide:  Patient has the following protective factors against suicide: Access to outpatient mental health care, Supportive family, Supportive friends, Cultural, spiritual, or religious beliefs that discourage suicide, Minor children in the home, Frustration tolerance, no history of suicide attempts, and no history of NSSIB  Thank you for this consult request. Recommendations have been communicated to the primary team.  We will sign off at this time.   Majel GORMAN Ramp, FNP       History of Present Illness  Relevant Aspects of [facility]  Course:  Ronniesha Seibold. Aube is  a 33 year old female with a history of morbid obesity, type 2 diabetes, hypertension, hyperlipidemia, prior pulmonary embolism, and anxiety/depression. She was recently hospitalized from 10/13 to 11/4 under the GYN service for a complicated course involving pelvic abscesses and sepsis, requiring exploratory laparotomy and drainage of multiple intra-abdominal fluid collections. She returned to the ED on 11/7--three days after discharge--with refractory nausea and vomiting complicated by hypokalemia. Initial imaging again raised concern for recurrent abscess, and she was readmitted to the GYN service with TRH consulting. Repeat CT abdomen/pelvis on 11/17 demonstrated persistent inflammatory changes in the right hemipelvis with a percutaneous drain in place, interval reduction of the posterior abscess cavity, and a small anterior loculated collection measuring approximately 2.3  1.8  2.1 cm. Imaging also noted mild-moderate right hydronephrosis and hydroureter, possibly secondary to mass effect from the pelvic process, with right renal hypoenhancement concerning for obstruction versus pyelonephritis. Additional findings included decreased fluid collections along the right rectus sheath and umbilicus, and layering sludge versus small gallstones in the gallbladder. She remains hospitalized for ongoing management of recurrent pelvic abscess, persistent inflammation, nausea/vomiting, and associated renal and urinary tract complications.  Patient Report:  The patient reports a long-standing history of depression and anxiety, with symptom worsening over the past several weeks during her prolonged hospitalization for a pelvic floor abscess requiring drainage. She states she previously experienced relief with Zoloft  and Buspirone  but discontinued them after feeling better, and her symptoms gradually returned prior to admission. She describes feeling overwhelmed, exhausted, and unable to care for her 27 year old son, who  requires full assistance with daily living tasks. She endorses significant anxiety, including excessive worry about nausea, vomiting, eating, drinking, ambulation, and pain. She reports feeling withdrawn, low in motivation, and emotionally flat but denies suicidal ideation, homicidal ideation, hallucinations, or substance use. She states she is open to psychiatric treatment but notes concern about being uninsured and unsure what services she will be eligible to receive following discharge.  Psych ROS:  Depression: Guilty, increase in pain, insomnia, difficulty concentrating, isolation Anxiety: excessive worrying Mania (lifetime and current): Denies Psychosis: (lifetime and current): Denies  Collateral information:  None obtained. Father was visiting but left shortly after.   Review of Systems  Psychiatric/Behavioral:  Positive for depression. Negative for suicidal ideas. The patient is nervous/anxious.   All other systems reviewed and are negative.    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 rate 20, height 5' 4 (1.626 m), weight 128.1 kg, last menstrual period 11/18/2023, SpO2 95%. Body mass index is 48.47 kg/m.  Physical Exam Vitals and nursing note reviewed.  Constitutional:      Appearance: She is well-developed and normal weight.  Skin:    General: Skin is warm and dry.  Neurological:     General: No focal deficit present.     Mental Status: She is alert and oriented to person, place, and time.  Psychiatric:        Mood and Affect: Mood normal.        Behavior: Behavior normal.     Mental Status Exam: General Appearance: Disheveled  Orientation:  Full (Time, Place, and Person)  Memory:  Immediate;   Fair Recent;   Fair Remote;   Fair  Concentration:  Concentration: Fair and Attention Span: Fair  Recall:  Good  Attention  Good  Eye Contact:  Good  Speech:  Slow  Language:  Fair  Volume:  Decreased  Mood: Im ok   Affect:  Depressed and Flat  Thought Process:  Coherent, Linear, and Descriptions of Associations: Intact  Thought Content:  Logical  Suicidal Thoughts:  No  Homicidal Thoughts:  No  Judgement:  Fair  Insight:  Present  Psychomotor Activity:  Normal  Akathisia:  No  Fund of Knowledge:  Fair      Assets:  Manufacturing Systems Engineer Desire for Improvement Financial Resources/Insurance Leisure Time Physical Health Resilience Social Support Transportation Vocational/Educational  Cognition:  WNL  ADL's:  Intact  AIMS (if indicated):        Other History   These have been pulled in through the EMR, reviewed, and updated if appropriate.  Family History:  The patient's family history includes Anxiety disorder in her father and mother; Arthritis in her mother; Asthma in her maternal aunt and sister; Bipolar disorder in her father;  Dementia in her maternal grandmother; Depression in her father and mother; Diabetes in her maternal aunt, mother, and paternal grandmother; Epilepsy in her maternal aunt; Heart disease in her maternal grandmother and mother; High Cholesterol in her father; High blood pressure in her father; Hypertension in her maternal aunt, maternal grandmother, and mother; Kidney disease in her brother; Obesity in her father; Stroke in her father and mother.  Medical History: Past Medical History:  Diagnosis Date   Anxiety    Back pain    Chlamydia    Depression med made her navel itching and  made her sleepy so she quit taking them   Diabetes mellitus without complication (HCC)    Edema, lower extremity    Elevated cholesterol    Fibrocystic breast changes of both breasts    GERD (gastroesophageal reflux disease)    History of cesarean section, classical 12/18/2012   2014    Human papilloma virus    Hypertension    Insomnia    Joint pain    Moderate dysplasia of cervix    Numbness    right arm to hand   Ovarian cyst    Prediabetes    Tubo-ovarian abscess 12/02/2023   Vitamin D  deficiency     Surgical History: Past Surgical History:  Procedure Laterality Date   ABDOMINAL HYSTERECTOMY N/A 12/08/2023   Procedure: HYSTERECTOMY, ABDOMINAL;  Surgeon: Marilynn Nest, DO;  Location: [facility] OR;  Service: Gynecology;  Laterality: N/A;  W/ EXPLORATORY LAPAROTOMY   BREAST BIOPSY Left 2012   benign fibroadeoma   BREAST SURGERY     CESAREAN SECTION  N/A 12/13/2012   Procedure: PRIMARY CESAREAN SECTION;  Surgeon: Aida DELENA Na, MD;  Location: [facility] ORS;  Service: Obstetrics;  Laterality: N/A;   ESOPHAGOGASTRODUODENOSCOPY N/A 12/29/2023   Procedure: EGD (ESOPHAGOGASTRODUODENOSCOPY);  Surgeon: Leigh Elspeth SQUIBB, MD;  Location: [facility] ENDOSCOPY;  Service: Gastroenterology;  Laterality: N/A;   IR CV LINE INJECTION  01/07/2024   IR US  GUIDE BX ASP/DRAIN  01/01/2024   IR US  GUIDE BX ASP/DRAIN  01/01/2024   LEEP        Medications:   Current Facility-Administered Medications:    amLODipine (NORVASC) tablet 10 mg, 10 mg, Oral, Daily, Akula, Vijaya, MD, 10 mg at 01/07/24 1027   apixaban (ELIQUIS) tablet 5 mg, 5 mg, Oral, BID, Ozan, Jennifer, DO, 5 mg at 01/07/24 1027   bisacodyl (DULCOLAX) suppository 10 mg, 10 mg, Rectal, Once PRN, Izell Harari, MD   Chlorhexidine Gluconate Cloth 2 % PADS 6 each, 6 each, Topical, Daily, Cleatus Moccasin, MD, 6 each at 01/06/24 1058   diphenhydrAMINE  (BENADRYL ) capsule 25 mg, 25 mg, Oral, Q6H PRN, Ozan, Jennifer, DO   feeding supplement (ENSURE PLUS HIGH PROTEIN) liquid 237 mL, 237 mL, Oral, Q24H, Dunn, Rollo DASEN, MD   glycopyrrolate  (ROBINUL ) tablet 1 mg, 1 mg, Oral, Q6H, Ozan, Jennifer, DO, 1 mg at 01/07/24 1324   guaiFENesin  (ROBITUSSIN) 100 MG/5ML liquid 15 mL, 15 mL, Oral, Q4H PRN, Fredirick Glenys RAMAN, MD   insulin  aspart (novoLOG) injection 0-20 Units, 0-20 Units, Subcutaneous, TID WC, Akula, Vijaya, MD   insulin  aspart (novoLOG) injection 0-5 Units, 0-5 Units, Subcutaneous, QHS, Akula, Vijaya, MD   lidocaine-EPINEPHrine  (PF) (XYLOCAINE-EPINEPHrine ) 1 %-1:200000 (PF) injection 20 mL, 20 mL, Infiltration, Once, Mugweru, Jon, MD   LORazepam (ATIVAN) tablet 0.5 mg, 0.5 mg, Oral, Q6H PRN, Abigail, Rollo DASEN, MD   losartan (COZAAR) tablet 50 mg, 50 mg, Oral, Daily, Dahal, Binaya, MD, 50 mg at 01/07/24 1325   menthol  (CEPACOL) lozenge 3 mg, 1 lozenge, Oral, Q2H PRN, Fredirick Glenys RAMAN, MD   metoCLOPramide  (REGLAN ) tablet 10 mg, 10 mg, Oral, Q6H, Ozan, Jennifer, DO, 10 mg at 01/07/24 1324   metoprolol tartrate (LOPRESSOR) injection 5 mg, 5 mg, Intravenous, Q5 min PRN, Cleatus Moccasin, MD   ondansetron  (ZOFRAN ) tablet 4 mg, 4 mg, Oral, Q8H PRN, Ozan, Jennifer, DO, 4 mg at 01/07/24 1512   oxyCODONE -acetaminophen  (PERCOCET/ROXICET) 5-325 MG per tablet 1-2 tablet, 1-2 tablet, Oral, Q6H PRN, Abigail Rollo DASEN, MD, 2 tablet at 01/07/24 1026   pantoprazole  (PROTONIX ) EC tablet 40 mg, 40  mg, Oral, BID, Ozan, Jennifer, DO, 40 mg at 01/07/24 1027   polyethylene glycol (MIRALAX  / GLYCOLAX ) packet 17 g, 17 g, Oral, Daily PRN, Fredirick Glenys RAMAN, MD   prochlorperazine  (COMPAZINE ) injection 10 mg, 10 mg, Intravenous, Q6H PRN, Fredirick Glenys RAMAN, MD, 10 mg at 01/07/24 1226   scopolamine  (TRANSDERM-SCOP) 1 MG/3DAYS 1 mg, 1 patch, Transdermal, Q72H, Fredirick Glenys RAMAN, MD, 1 mg at 01/04/24 1740   senna-docusate (Senokot-S) tablet 1 tablet, 1 tablet, Oral, BID, Dahal, Binaya, MD, 1 tablet at 01/07/24 1324   sodium chloride  flush (NS) 0.9 % injection 10-40 mL, 10-40 mL, Intracatheter, PRN, Cleatus Moccasin, MD, 10 mL at 12/31/23 1333  Allergies: Allergies  Allergen Reactions   Macrobid  [Nitrofurantoin  Macrocrystal] Itching    Caused the patient to feel hot.    Majel RAMAN Ramp, FNP

## 2024-01-07 NOTE — Procedures (Signed)
 Interventional Radiology Procedure Note  Procedure: FLUORO DRAIN INJECTION AND REMOVAL    Complications: None  Estimated Blood Loss:  0  Findings: NEG FOR FISTULA DRAIN PULLED     EMERSON FREDERIC SPECKING, MD

## 2024-01-07 NOTE — Progress Notes (Signed)
    Faculty Practice OB/GYN Attending Note  Subjective:  Patient reports feeling tired and fatigued. New pain in her right calf that has been bothering her today    Objective:  Blood pressure (!) 160/85, pulse 84, temperature 98.5 F (36.9 C), temperature source Oral, resp. rate 20, height 5' 4 (1.626 m), weight 128.1 kg, last menstrual period 11/18/2023, SpO2 96%.  Gen: NAD Pysch: Mood appropriate, flat affect. Abdomen: obese, soft and non-tender, no rebound or guarding Midline incision- with dressing- due for dressing change today, RN notified Ext: no edema or calf tenderness bilaterally  Lab Results  Component Value Date   WBC 4.8 01/04/2024   HGB 8.7 (L) 01/04/2024   HCT 27.2 (L) 01/04/2024   MCV 97.5 01/04/2024   PLT 223 01/04/2024   Lab Results  Component Value Date   NA 135 01/06/2024   K 3.3 (L) 01/06/2024   CO2 26 01/06/2024   GLUCOSE 98 01/06/2024   BUN <5 (L) 01/06/2024   CREATININE 1.30 (H) 01/06/2024   CALCIUM  9.0 01/06/2024   EGFR 84 11/20/2022   GFRNONAA 55 (L) 01/06/2024     Assessment & Plan:  33 y.o. G1P0101 admitted due to nausea/vomiting.  1) GI-intractable nausea and vomiting. Intermittent now, on all oral medications with IV pain meds prn - Encourage diet - supportive measures  2) GYN- postop ex-Lap with abscess drainage (10/20)  - Dressing in place appears clean and dry, dressing changed this morning by RN - Meeting postoperative milestones - s/p IR drain removal  3)ID - s/p IR drainage on 10/29 and additional drainage of subQ collection on 11/13  - Last imaging completed 12/29/2023, did note improvement in fluid collections  4) Heme/ PE - CBC remained stable - transitioned to eliquis - given new pain in right calf, LE dopplers ordered  5) Multiple medical issues including HTN and type 2 DM, AKI - TRH following  6) Psych: depressed mood endorsed, limiting her ability to participate in her own care, have requested psych consult  (previously turned away), patient amenable now  7) DISPO: Continue to provide supportive care, she is greatly deconditioned from her prolonged hospitalization and has been intermittently been refusing/turning away PT evaluation. She has seen PT today.  I have also requested OT consult.  Actively working with case management on her dispo plan, exploring rehab options before discharge home as she needs to be able to take care of herself as well as her 34 year old special needs son  Rollo ONEIDA Bring, MD, FACOG Obstetrician & Gynecologist, [employer] for [employer], [facility]

## 2024-01-07 NOTE — OR Nursing (Signed)
 RN notified of patients refusal of care from nurse tech Easter Lance. Patient refused vitals from nurse tech.

## 2024-01-08 ENCOUNTER — Encounter: Payer: Self-pay | Admitting: Physician Assistant

## 2024-01-08 ENCOUNTER — Other Ambulatory Visit (HOSPITAL_COMMUNITY): Payer: Self-pay

## 2024-01-08 LAB — CBC WITH DIFFERENTIAL/PLATELET
Abs Immature Granulocytes: 0.02 K/uL (ref 0.00–0.07)
Basophils Absolute: 0 K/uL (ref 0.0–0.1)
Basophils Relative: 1 %
Eosinophils Absolute: 0.1 K/uL (ref 0.0–0.5)
Eosinophils Relative: 2 %
HCT: 31.4 % — ABNORMAL LOW (ref 36.0–46.0)
Hemoglobin: 10.3 g/dL — ABNORMAL LOW (ref 12.0–15.0)
Immature Granulocytes: 0 %
Lymphocytes Relative: 24 %
Lymphs Abs: 1.5 K/uL (ref 0.7–4.0)
MCH: 31.4 pg (ref 26.0–34.0)
MCHC: 32.8 g/dL (ref 30.0–36.0)
MCV: 95.7 fL (ref 80.0–100.0)
Monocytes Absolute: 0.9 K/uL (ref 0.1–1.0)
Monocytes Relative: 14 %
Neutro Abs: 3.8 K/uL (ref 1.7–7.7)
Neutrophils Relative %: 59 %
Platelets: 308 K/uL (ref 150–400)
RBC: 3.28 MIL/uL — ABNORMAL LOW (ref 3.87–5.11)
RDW: 13.6 % (ref 11.5–15.5)
WBC: 6.4 K/uL (ref 4.0–10.5)
nRBC: 0 % (ref 0.0–0.2)

## 2024-01-08 LAB — BASIC METABOLIC PANEL WITH GFR
Anion gap: 12 (ref 5–15)
BUN: 5 mg/dL — ABNORMAL LOW (ref 6–20)
CO2: 26 mmol/L (ref 22–32)
Calcium: 9.1 mg/dL (ref 8.9–10.3)
Chloride: 97 mmol/L — ABNORMAL LOW (ref 98–111)
Creatinine, Ser: 1.3 mg/dL — ABNORMAL HIGH (ref 0.44–1.00)
GFR, Estimated: 55 mL/min — ABNORMAL LOW (ref >60)
Glucose, Bld: 91 mg/dL (ref 70–99)
Potassium: 3.6 mmol/L (ref 3.5–5.1)
Sodium: 135 mmol/L (ref 135–145)

## 2024-01-08 LAB — GLUCOSE, CAPILLARY
Glucose-Capillary: 96 mg/dL (ref 70–99)
Glucose-Capillary: 99 mg/dL (ref 70–99)

## 2024-01-08 MED ORDER — DOCUSATE SODIUM 100 MG PO CAPS
100.0000 mg | ORAL_CAPSULE | Freq: Two times a day (BID) | ORAL | 0 refills | Status: AC
  Filled 2024-01-08: qty 10, 5d supply, fill #0

## 2024-01-08 MED ORDER — DOCUSATE SODIUM 100 MG PO CAPS: 100.0000 mg | ORAL_CAPSULE | Freq: Two times a day (BID) | ORAL | Status: DC

## 2024-01-08 MED ORDER — DIATRIZOATE MEGLUMINE & SODIUM 66-10 % PO SOLN
100.0000 mL | Freq: Once | ORAL | Status: AC
  Administered 2024-01-08: 100 mL via ORAL
  Filled 2024-01-08: qty 120

## 2024-01-08 NOTE — Progress Notes (Signed)
 PICC removed per protocol per MD order. Manual pressure applied for 5 mins. Vaseline gauze, gauze, and Tegaderm applied over insertion site. No bleeding or swelling noted. Instructed patient to remain in bed for thirty mins. Educated patient about S/S of infection and when to call MD; no heavy lifting or pressure on right side for 24 hours; keep dressing dry and intact for 24 hours. Pt verbalized comprehension.

## 2024-01-08 NOTE — Consult Note (Addendum)
 WOC Nurse Consult Note: Reason for Consult: Consult requested to assess wound appearance and demonstrate dressing change  to prepare for discharge.  Pt is familiar to the [facility] team since she had a Vac last week, which has been discontinued, since wound was shallow at that time.   Midline abd with full thickness healing post-op wound; 4X1X.2cm.  Red and moist with small amt yellow drainage with darker-colored scar tissue surrounding the site.    Demonstrated dressing change to patient as outlined below.  She watched the procedure and states she will be able to perform the dressing changes after discharge.  Instructed her to stand and look in a mirror so she can see the location; she denies further questions.   Please re-consult if further assistance is needed.  Thank-you,  Stephane Fought MSN, RN, CWOCN, CWCN-AP, CNS Contact Mon-Fri 0700-1500: [phone]

## 2024-01-08 NOTE — Discharge Summary (Signed)
 Physician Discharge Summary  Patient ID: Misty Walsh MRN: [id number] DOB/AGE: 1990-12-18 33 y.o.  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  Discharge Diagnoses:  Principal Problem:   Intractable nausea and vomiting Active Problems:   Hyperlipemia   Primary hypertension   Type 2 diabetes mellitus without complication, without long-term current use of insulin  (HCC)   Tubo-ovarian abscess   AKI (acute kidney injury)   S/P exploratory laparotomy   Pulmonary embolism (HCC)   Hypokalemia   Open wound anterior abdominal wall   Gastric erythema   Discharged Condition: stable  Hospital Course: pt was readmitted after ling hospitalization, 10/13-11/4/25 which included Exp lap with wash out of pelvic abdominal abscess thought to be originally TOAs.  Se that discharge summary for details  During this hospitalization no antibiotics were restarted and there was no evidence of active infection Essentially her course was characterized by slow recovery of bowel function and poor ambulation Hospitalist and GI consutled as well as wound care team and PT  She continued to complain of nausea and vomiting but her BUN remained in single digits  No real change in care plan was made during this hospitalization just supportive care in this challengin social situation, never any evidence of SBO just hypodynamic ileus  On day of discharge we were arranging all of her support for home and follow up but she had a family emergency and required leaving immediately All follow ups and meds were arranged and delviered  To be seen in office in 5 days for interval evaluation   Consults: GI, hospitalist, wound care, PT  Significant Diagnostic Studies: CT cans and labs  Treatments: supprotive care and hydration  Discharge  Exam: Blood pressure 138/84, pulse 89, temperature 98.2 F (36.8 C), temperature source Oral, resp. rate 18, height 5' 4 (1.626 m), weight 128.1 kg, last menstrual period 11/18/2023, SpO2 98%.   Disposition: Discharge disposition: 01-Home or Self Care       Discharge Instructions     Ambulatory referral to Physical Therapy   Complete by: As directed    Ambulatory referral to Physical Therapy   Complete by: As directed    Ambulatory referral to Urology   Complete by: As directed    For right hydronephrosis from pelvic abscesses.   Diet - low sodium heart healthy   Complete by: As directed    Discharge wound care:   Complete by: As directed    Per wound care team   Increase activity slowly   Complete by: As directed       Allergies as of 01/08/2024       Reactions   Macrobid  [nitrofurantoin  Macrocrystal] Itching   Caused the patient to feel hot.        Medication List     TAKE these medications    amLODipine  5 MG tablet Commonly known as: NORVASC  Take 1 tablet (5 mg total) by mouth daily.   atorvastatin  80 MG tablet Commonly known as: LIPITOR  Take 1 tablet (80 mg total) by mouth daily.   busPIRone  5 MG tablet Commonly known as: BUSPAR  Take 1 tablet (5 mg total) by mouth 3 (three) times daily.   chlorthalidone  25 MG tablet Commonly known as: HYGROTON  Take 1 tablet (25 mg total) by mouth daily.   docusate sodium  100 MG capsule Commonly known as: COLACE Take 1 capsule (100 mg total) by mouth 2 (two)  times daily.   Eliquis  5 MG Tabs tablet Generic drug: apixaban  Take 2 tablets by mouth twice a day for 3 doses then start 1 tablet twice a day thereafter   glycopyrrolate  1 MG tablet Commonly known as: ROBINUL  Take 1 tablet (1 mg total) by mouth 3 (three) times daily.   losartan  50 MG tablet Commonly known as: COZAAR  Take 1 tablet (50 mg total) by mouth daily.   metFORMIN  500 MG tablet Commonly known as: GLUCOPHAGE  Take 1 tablet (500 mg total) by  mouth 2 (two) times daily with a meal.   metoCLOPramide  10 MG tablet Commonly known as: REGLAN  Take 1 tablet (10 mg total) by mouth every 8 (eight) hours for 14 days.   ondansetron  4 MG tablet Commonly known as: ZOFRAN  Take 1 tablet (4 mg total) by mouth every 6 (six) hours as needed for nausea or vomiting.   promethazine  25 MG tablet Commonly known as: PHENERGAN  Take 1 tablet (25 mg total) by mouth every 6 (six) hours as needed for refractory nausea / vomiting.   scopolamine  1 MG/3DAYS Commonly known as: TRANSDERM-SCOP Place 1 patch (1 mg total) onto the skin every 3 (three) days.   sertraline  50 MG tablet Commonly known as: ZOLOFT  Take 1 tablet (50 mg total) by mouth daily.   sodium chloride  flush 0.9 % Soln Commonly known as: NS Place 10 mLs into feeding tube daily AND flush drain once daily with 5 mL saline from prefilled syringe.               Durable Medical Equipment  (From admission, onward)           Start     Ordered   01/08/24 0932  For home use only DME 3 n 1  Once       Comments: Please provide bariatric 3n1   01/08/24 0932              Discharge Care Instructions  (From admission, onward)           Start     Ordered   01/08/24 0000  Discharge wound care:       Comments: Per wound care team   01/08/24 1258            Follow-up Information     Buck Search, PA-C. Go on 01/22/2024.   Specialty: Physician Assistant Why: PCP- hospital follow up with Annabella Pounds- NP- on 12/4 Thursday at 11:15 am- arrive 10 min early to appointment.  Patient is also on waiting list for earlier appointment.[name] ETTER Search is not available )- appointment is made with another provider in your office. Contact information: [address] [place] [address] [phone]         [facility] Follow up on 02/26/2024.   Why: appointment with Ellouise Console PA for GI follow up- date is January 8th Thursday at 9:40 am- please arrive 10 min  early for appointment.  [facility])        [facility] Follow up on 01/12/2024.   Why: They will provide 3 physical therapy visits- to you in your home after discharge Contact information: #[phone]        Jayne Vonn DEL, MD Follow up on 01/13/2024.   Specialties: Obstetrics and Gynecology, Radiology Why: For wound re-check Contact information: [address] [place] [address] [phone]                 Signed: Vonn DEL  Zacariah Belue 01/08/2024, 1:49 PM

## 2024-01-08 NOTE — TOC Transition Note (Addendum)
 Transition of Care [facility]) - Discharge Note   Patient Details  Name: Misty Walsh MRN: [id number] Date of Birth: 1990-08-31  [facility]) CM/SW Contact:  Charlene Julian Daring, RN Phone Number:[phone] 01/08/2024, 3:35 PM   Clinical Narrative:    Misty Walsh is a 33 y.o. female with PMH significant for morbid obesity, DM2, HTN, HLD, PE, anxiety/depression  Patient did not qualify for Inpatient Rehab.  CM met with patient today and requested 3n1 per OT recommendations. This was ordered from [facility] and provided to patient in room prior to discharge.  Patient has bariatric rolling walker at her home from recent previous admission. Patient's vac(peel/stick) from [employer] was discontinued this hospitalization and home vac taken to nursing station and CM called Randine # [phone] that patient no longer needed. She verbalized understanding and plans to have driver pick up at [facility] nursing station.  Wound Ostomy RN saw patient today and did education for wound care daily after shower with patient and supplies from nursing unit sent home with patient. Patient verbalized understanding.  Mental Health outpatient resources given to patient by CM.  RNCM - secured the following appointments: 1- Dr. Jayne- 01/13/24- any time-wound check  2- PCP- Annabella Pounds NP-01/22/24 11:15 am 3- [facility]- Ellouise Console PA -02/26/24 4- Outpatient [facility] for outpatient PT 5- Urology outpatient referral sent by hospitalist  ( CM had set up charity PT - 3 visit for patient after discharge in the home but RN notified CM that patient needed to leave today due to family emergency and would be going to [place] to stay with cousin , she and her son. HHPT- unable to be secured in that area. Patient verbalized understanding.  Medications were provided by the [facility] program and at no cost to patient.   CM called while in patient's room to DSS  medicaid worker Ms Forton 3 [phone] and CM and patient talked to worker and she shared she is working on trying to complete medicaid application and is waiting on medical records from [facility]. She shared she would stay in contact with patient with updates.   Patient's father will pick patient up from hospital for discharge.     Final next level of care: Home/Self Care Barriers to Discharge: Financial Resources, Continued Medical Work up     Discharge Placement Home and patient's cousin plans to come to her home today and take her to [place] to stay with her. She agreed to follow up in [place] and plans to return to see Dr. Jayne on Tuesday 11/25.  Discharge Plan and Services Additional resources added to the After Visit Summary for   In-house Referral: Clinical Social Work, Nutrition, Conservator, Museum/gallery Services: [facility] Program              DME Agency:  ([employer]- wound vac- peel / stick - continue as set up PTA)-discontinued  DME- 3n1- [employer]                    Social Drivers of Health (SDOH) Interventions SDOH Screenings   Food Insecurity: No Food Insecurity (12/26/2023)  Housing: Low Risk  (12/26/2023)  Transportation Needs: No Transportation Needs (12/26/2023)  Utilities: Not At Risk (12/26/2023)  Depression (PHQ2-9): Low Risk  (08/15/2022)  Financial Resource Strain: Not on File (06/07/2021)   Received from [facility]  Physical Activity: Not on File (06/07/2021)   Received from [facility]  Social Connections: Not on  File (11/04/2022)   Received from [facility]  Stress: Not on File (06/07/2021)   Received from [facility]  Tobacco Use: Medium Risk (12/29/2023)     Readmission Risk Interventions    01/06/2024    3:16 PM  Readmission Risk Prevention Plan  Transportation Screening Complete  PCP or Specialist Appt within 5-7 Days Complete  Home Care Screening Complete  Medication Review (RN CM) Referral to Pharmacy

## 2024-01-08 NOTE — Progress Notes (Signed)
 PROGRESS NOTE  Misty Walsh  DOB: 1990-09-20  PCP: Buck Search, PA-C FMW:[id number]  DOA: 12/26/2023  LOS: 13 days  Hospital Day: 14  Subjective: Patient was seen and examined this morning. Lying down in bed.  Has dry heaves.  States she has not been able to tolerate oral intake or hydration but BUN is single-digit without IV hydration Nausea, vomiting Had 1 small bowel movement yesterday Afebrile, blood pressure in 150s mostly, breathing room air Labs from this morning with BUN/creatinine 5/1.3, WBC count normal, hemoglobin 10.3.  Brief narrative: Misty Walsh is a 33 y.o. female with PMH significant for morbid obesity, DM2, HTN, HLD, PE, anxiety/depression Recently hospitalized under GYN service from 10/13 to 11/4 in which she had complicated course following pelvic abscesses and sepsis, s/p exploratory laparotomy, multiple intra-abdominal fluid collections, some partially drained.  11/7, returned back to the ED after 3 days of discharge with refractory nausea vomiting c/b hypokalemia. Initial CT scan was worrisome for an abscess Admitted to GYN service TRH following for consult  Most recent CT abdomen/pelvis from 11/17 showed  1. Persistent inflammatory process in the right hemipelvis with a formed loop of a percutaneous drainage catheter in place. The drained posterior abscess component appears reduced in volume compared to previous, and there continues to be a 2.3 by 1.8 by 2.1 cm fluid density anteriorly within this process which may represent a separate abscess loculation. 2. Mild to moderate right hydronephrosis and hydroureter extending down into the pelvis where the ureter becomes indistinct adjacent to the known regional right pelvic abscesses. Right renal parenchymal hypoenhancement could be from obstruction or pyelonephritis. 3. Reduced size of the fluid collection in the anterior right rectus sheath and bulging into the subcutaneous tissues. 4. Reduced size of  the small fluid collection along the inferior margin of the umbilicus. 5. Layering density in the gallbladder could be from sludge or small gallstones.  Assessment and plan: Intractable nausea and vomiting Symptoms gradually improving. S/p EGD by gastroenterology.  Showed erythematous mucosa in the stomach likely reactive and related to vomiting and retching. It is unclear at this time how much of her symptoms are genuine versus malingering.  With ongoing abdominal pelvic abscess and inflammation, she is certainly at risk of bowel issues but probably not as bad as she claims. Discussed with GYN Dr. Jayne this morning.  He plans to do a Gastrografin study. I would encourage patient to be more mobile.  I do not see a need of IV hydration at this time. Hospitalist service will sign off today  Constipation Yesterday, reported no bowel movement in 4 days.  MiraLAX  and enema given.  Reports 1 bowel movement since then. Currently on Senokot 1 tab twice daily, continue MiraLAX  as needed.  Can try 1 dose of enema today   Abdominal wall abscess IR consulted for drain and fluid to be sent for analysis.  ID consulted for new abscesses, since cultures have been negative, will watch her off antibiotics.  ID  has signed off.    Pelvic abscesses  s/p pelvic drain placement 10/29 with Dr. Vanice, and abdominal wall fluid collection s/p aspiration on 11/13 by Dr. Hughes.  Right transgluteal drain on 10/29,. S/p exp laparotomy on 10/20, abdominal wall wound connected wound VAC which was removed few days ago..  11/19, JP drain removed by IR.  AKI  Mild to moderate right hydronephrosis/hydroureter likely secondary to poor oral intake and patient was also found to have right moderate hydronephrosis from the pelvic  abscesses Creatinine slowly improving. And has stabilized at 1.3.  Recommend checking US  renal in 4 weeks to evaluate for improvement in her hydronephrosis.  She would also benefit from ambulatory  referral to Urology on discharge.  Ambulatory referral has been requested Recommend checking creatinine in 1 week on discharge.  Recent Labs    12/27/23 0917 12/28/23 1221 12/29/23 0428 12/30/23 0425 12/31/23 0446 01/01/24 0513 01/02/24 0613 01/04/24 0515 01/06/24 0530 01/08/24 0619  BUN 12 9 7 6 6  5* <5* 5* <5* 5*  CREATININE 1.12* 1.39* 1.45* 1.41* 1.27* 1.27* 1.32* 1.36* 1.30* 1.30*    Hypokalemia/hypomagnesemia Electrolyte levels were monitored and replaced. Repeat labs in a.m. Recent Labs  Lab 01/02/24 0613 01/04/24 0515 01/06/24 0530 01/08/24 0619  K 3.6 3.5 3.3* 3.6  MG 1.8  --   --   --    Prediabetes A1c 6.3 on 12/02/2023 PTA meds-metformin Currently on SSI/Accu-Cheks Recent Labs  Lab 01/05/24 2203 01/06/24 2229 01/07/24 1017 01/07/24 2150 01/08/24 0833  GLUCAP 97 94 113* 96 99   Hypertension Blood pressure elevated to 140s and 160s  PTA meds- chlorthalidone , amlodipine and Losartan.  Currently on amlodipine 10 mg daily.  Resume losartan 50 mg daily today    H/o pulmonary embolism  11/19, ultrasound duplex of lower extremities showed findings consistent with age indeterminate deep vein thrombosis  involving the left peroneal veins.  Currently continued on Eliquis   Acute anemia Baseline hemoglobin around 12. Hemoglobin dropped but stabilized close to 9.  Acute drop in hemoglobin likely due to acute illness. Anemia panel reviewed  Transfuse to keep it greater than 7 Recent Labs    12/11/23 0052 12/11/23 0057 12/11/23 0710 12/12/23 0437 01/01/24 0513 01/02/24 0613 01/03/24 0823 01/04/24 0515 01/08/24 0619  HGB  --    < >  --    < > 9.1* 8.9* 8.9* 8.7* 10.3*  MCV  --    < >  --    < > 96.6 97.2 98.2 97.5 95.7  VITAMINB12  --   --  544  --   --   --   --   --   --   FOLATE 8.8  --   --   --   --   --   --   --   --   FERRITIN 688*  --   --   --   --   --   --   --   --   TIBC 207*  --   --   --   --   --   --   --   --   IRON 67  --   --    --   --   --   --   --   --   RETICCTPCT 2.5  --   --   --   --   --   --   --   --    < > = values in this interval not displayed.   Depression Flat affect.  Psychiatry consult obtained on 11/19.  Started on Zoloft , BuSpar  She denies any suicidal ideation.    Morbid Obesity  Body mass index is 48.47 kg/m. Patient has been advised to make an attempt to improve diet and exercise patterns to aid in weight loss.  Mobility: Seen by PT  PT Orders: Active   PT Follow up Rec: Acute Inpatient Rehab (3hours/Day)01/07/2024 1034    Goals of care   Code Status: Full  Code     DVT prophylaxis:   apixaban (ELIQUIS) tablet 5 mg   Antimicrobials: None Fluid: None Family Communication: None at bedside  Status: Inpatient Level of care:  Med-Surg     Diet:  Diet Order             Diet - low sodium heart healthy           Diet regular Room service appropriate? Yes; Fluid consistency: Thin  Diet effective now                   Scheduled Meds:  amLODipine  10 mg Oral Daily   apixaban  5 mg Oral BID   busPIRone   5 mg Oral Q8H   Chlorhexidine Gluconate Cloth  6 each Topical Daily   docusate sodium   100 mg Oral BID   feeding supplement  237 mL Oral Q24H   glycopyrrolate   1 mg Oral Q6H   insulin  aspart  0-20 Units Subcutaneous TID WC   insulin  aspart  0-5 Units Subcutaneous QHS   lidocaine-EPINEPHrine  (PF)  20 mL Infiltration Once   losartan  50 mg Oral Daily   metoCLOPramide   10 mg Oral Q6H   pantoprazole   40 mg Oral BID   scopolamine   1 patch Transdermal Q72H   senna-docusate  1 tablet Oral BID   sertraline   25 mg Oral Daily    PRN meds: bisacodyl, diphenhydrAMINE , guaiFENesin , LORazepam, menthol , metoprolol tartrate, ondansetron , oxyCODONE -acetaminophen , polyethylene glycol, prochlorperazine , sodium chloride  flush   Infusions:    Antimicrobials: Anti-infectives (From admission, onward)    None       Objective: Vitals:   01/08/24 0826 01/08/24 1304   BP: (!) 159/101 138/84  Pulse: 91 89  Resp: 18 18  Temp: 98.1 F (36.7 C) 98.2 F (36.8 C)  SpO2: 99% 98%   No intake or output data in the 24 hours ending 01/08/24 1332  Filed Weights   01/04/24 0210 01/05/24 0813  Weight: 126.6 kg 128.1 kg   Weight change:  Body mass index is 48.47 kg/m.   Physical Exam: General exam: Pleasant, morbidly obese young Caucasian female.  Obese Skin: No rashes, lesions or ulcers. HEENT: Atraumatic, normocephalic, no obvious bleeding Lungs: Clear to auscultation bilaterally,  CVS: S1, S2, no murmur,   GI/Abd: Soft, mild diffuse tenderness but inconsistent on repeated exam, nondistended, bowel sound present,   CNS: Alert, awake, oriented x 3 Psychiatry: Sad affect Extremities: No pedal edema, no calf tenderness,   Data Review: I have personally reviewed the laboratory data and studies available.  F/u labs ordered Unresulted Labs (From admission, onward)    None       Signed, Chapman Rota, MD [facility] 01/08/2024

## 2024-01-08 NOTE — Progress Notes (Signed)
   01/08/24 1411  Departure Condition  Departure Condition Good  Mobility at [employer]  Patient/Caregiver Teaching Teach Back Method Used;Discharge instructions reviewed;Prescriptions reviewed;Pain management discussed;Admission discussed;Medications discussed;Follow-up care reviewed  Departure Mode With family   Patient alert and oriented x4, VS and pain stable. Case manager and wound care RN met with patient for additional education.

## 2024-01-08 NOTE — TOC Progression Note (Addendum)
 Transition of Care [facility]) - Progression Note    Patient Details  Name: Misty Walsh MRN: [id number] Date of Birth: July 30, 1990  [facility]) CM/SW Contact  Charlene Julian Daring, RN Phone Number:[phone] 01/08/2024, 10:46 AM  Clinical Narrative:    CM received notification back from IP rehab facilities that patient was declined admission and did not meet criteria for admission. 1- [place]- IP rehab 2- [place] ( CIR)- see note on 11/19 3- [facility]  CM notified provider    Expected Discharge Plan: Home/Self Care Barriers to Discharge: Financial Resources, Continued Medical Work up               Expected Discharge Plan and Services In-house Referral: Clinical Social Work, Nutrition, Conservator, Museum/gallery Services: [facility] Program   Living arrangements for the past 2 months: Apartment                                      Social Drivers of Health (SDOH) Interventions SDOH Screenings   Food Insecurity: No Food Insecurity (12/26/2023)  Housing: Low Risk  (12/26/2023)  Transportation Needs: No Transportation Needs (12/26/2023)  Utilities: Not At Risk (12/26/2023)  Depression (PHQ2-9): Low Risk  (08/15/2022)  Financial Resource Strain: Not on File (06/07/2021)   Received from [facility]  Physical Activity: Not on File (06/07/2021)   Received from [facility]  Social Connections: Not on File (11/04/2022)   Received from [facility]  Stress: Not on File (06/07/2021)   Received from [facility]  Tobacco Use: Medium Risk (12/29/2023)    Readmission Risk Interventions    01/06/2024    3:16 PM  Readmission Risk Prevention Plan  Transportation Screening Complete  PCP or Specialist Appt within 5-7 Days Complete  Home Care Screening Complete  Medication Review (RN CM) Referral to Pharmacy

## 2024-01-08 NOTE — Plan of Care (Signed)
  Problem: Education: Goal: Ability to describe self-care measures that may prevent or decrease complications (Diabetes Survival Skills Education) will improve Outcome: Adequate for Discharge Goal: Individualized Educational Video(s) Outcome: Adequate for Discharge   Problem: Coping: Goal: Ability to adjust to condition or change in health will improve Outcome: Adequate for Discharge   Problem: Health Behavior/Discharge Planning: Goal: Ability to identify and utilize available resources and services will improve Outcome: Adequate for Discharge Goal: Ability to manage health-related needs will improve Outcome: Adequate for Discharge   Problem: Metabolic: Goal: Ability to maintain appropriate glucose levels will improve Outcome: Adequate for Discharge   Problem: Nutritional: Goal: Maintenance of adequate nutrition will improve Outcome: Adequate for Discharge Goal: Progress toward achieving an optimal weight will improve Outcome: Adequate for Discharge   Problem: Skin Integrity: Goal: Risk for impaired skin integrity will decrease Outcome: Adequate for Discharge   Problem: Tissue Perfusion: Goal: Adequacy of tissue perfusion will improve Outcome: Adequate for Discharge   Problem: Education: Goal: Knowledge of General Education information will improve Description: Including pain rating scale, medication(s)/side effects and non-pharmacologic comfort measures Outcome: Adequate for Discharge   Problem: Health Behavior/Discharge Planning: Goal: Ability to manage health-related needs will improve Outcome: Adequate for Discharge   Problem: Clinical Measurements: Goal: Ability to maintain clinical measurements within normal limits will improve Outcome: Adequate for Discharge Goal: Will remain free from infection Outcome: Adequate for Discharge Goal: Diagnostic test results will improve Outcome: Adequate for Discharge Goal: Respiratory complications will improve Outcome:  Adequate for Discharge Goal: Cardiovascular complication will be avoided Outcome: Adequate for Discharge   Problem: Activity: Goal: Risk for activity intolerance will decrease Outcome: Adequate for Discharge   Problem: Nutrition: Goal: Adequate nutrition will be maintained Outcome: Adequate for Discharge   Problem: Coping: Goal: Level of anxiety will decrease Outcome: Adequate for Discharge   Problem: Elimination: Goal: Will not experience complications related to bowel motility Outcome: Adequate for Discharge Goal: Will not experience complications related to urinary retention Outcome: Adequate for Discharge   Problem: Pain Managment: Goal: General experience of comfort will improve and/or be controlled Outcome: Adequate for Discharge   Problem: Safety: Goal: Ability to remain free from injury will improve Outcome: Adequate for Discharge   Problem: Skin Integrity: Goal: Risk for impaired skin integrity will decrease Outcome: Adequate for Discharge

## 2024-01-13 ENCOUNTER — Other Ambulatory Visit: Payer: Self-pay

## 2024-01-13 ENCOUNTER — Encounter (HOSPITAL_COMMUNITY): Payer: Self-pay | Admitting: Obstetrics and Gynecology

## 2024-01-13 ENCOUNTER — Inpatient Hospital Stay (HOSPITAL_COMMUNITY): Payer: MEDICAID

## 2024-01-13 ENCOUNTER — Telehealth: Payer: Self-pay | Admitting: Obstetrics & Gynecology

## 2024-01-13 ENCOUNTER — Ambulatory Visit: Payer: Self-pay | Admitting: Obstetrics & Gynecology

## 2024-01-13 ENCOUNTER — Telehealth: Payer: Self-pay

## 2024-01-13 ENCOUNTER — Inpatient Hospital Stay (HOSPITAL_COMMUNITY)
Admission: AD | Admit: 2024-01-13 | Discharge: 2024-01-21 | DRG: 641 | Disposition: A | Discharge status: Home / Self Care | Payer: MEDICAID | Attending: Family Medicine | Admitting: Family Medicine

## 2024-01-13 DIAGNOSIS — R112 Nausea with vomiting, unspecified: Secondary | ICD-10-CM | POA: Diagnosis not present

## 2024-01-13 DIAGNOSIS — I10 Essential (primary) hypertension: Secondary | ICD-10-CM | POA: Diagnosis present

## 2024-01-13 DIAGNOSIS — Z90710 Acquired absence of both cervix and uterus: Secondary | ICD-10-CM | POA: Insufficient documentation

## 2024-01-13 DIAGNOSIS — R188 Other ascites: Principal | ICD-10-CM

## 2024-01-13 DIAGNOSIS — E785 Hyperlipidemia, unspecified: Secondary | ICD-10-CM | POA: Diagnosis present

## 2024-01-13 DIAGNOSIS — Z86711 Personal history of pulmonary embolism: Secondary | ICD-10-CM | POA: Insufficient documentation

## 2024-01-13 DIAGNOSIS — E119 Type 2 diabetes mellitus without complications: Secondary | ICD-10-CM

## 2024-01-13 DIAGNOSIS — R1900 Intra-abdominal and pelvic swelling, mass and lump, unspecified site: Secondary | ICD-10-CM | POA: Diagnosis not present

## 2024-01-13 LAB — CBC WITH DIFFERENTIAL/PLATELET
Abs Immature Granulocytes: 0.04 K/uL (ref 0.00–0.07)
Basophils Absolute: 0.1 K/uL (ref 0.0–0.1)
Basophils Relative: 1 %
Eosinophils Absolute: 0.1 K/uL (ref 0.0–0.5)
Eosinophils Relative: 1 %
HCT: 38.2 % (ref 36.0–46.0)
Hemoglobin: 12.5 g/dL (ref 12.0–15.0)
Immature Granulocytes: 1 %
Lymphocytes Relative: 20 %
Lymphs Abs: 1.4 K/uL (ref 0.7–4.0)
MCH: 30.9 pg (ref 26.0–34.0)
MCHC: 32.7 g/dL (ref 30.0–36.0)
MCV: 94.6 fL (ref 80.0–100.0)
Monocytes Absolute: 0.7 K/uL (ref 0.1–1.0)
Monocytes Relative: 10 %
Neutro Abs: 4.7 K/uL (ref 1.7–7.7)
Neutrophils Relative %: 67 %
Platelets: 442 K/uL — ABNORMAL HIGH (ref 150–400)
RBC: 4.04 MIL/uL (ref 3.87–5.11)
RDW: 13.5 % (ref 11.5–15.5)
WBC: 7 K/uL (ref 4.0–10.5)
nRBC: 0 % (ref 0.0–0.2)

## 2024-01-13 LAB — I-STAT CG4 LACTIC ACID, ED
Lactic Acid, Venous: 1.3 mmol/L (ref 0.5–1.9)
Lactic Acid, Venous: 2.6 mmol/L (ref 0.5–1.9)

## 2024-01-13 LAB — HCG, SERUM, QUALITATIVE: Preg, Serum: NEGATIVE

## 2024-01-13 MED ORDER — MORPHINE SULFATE (PF) 4 MG/ML IV SOLN
4.0000 mg | Freq: Once | INTRAVENOUS | Status: AC
  Administered 2024-01-13: 4 mg via INTRAVENOUS
  Filled 2024-01-13: qty 1

## 2024-01-13 MED ORDER — SODIUM CHLORIDE 0.9 % IV SOLN
12.5000 mg | Freq: Once | INTRAVENOUS | Status: AC
  Administered 2024-01-14: 12.5 mg via INTRAVENOUS
  Filled 2024-01-13: qty 0.5

## 2024-01-13 MED ORDER — PIPERACILLIN-TAZOBACTAM 3.375 G IVPB: 3.3750 g | Freq: Three times a day (TID) | INTRAVENOUS | Status: DC

## 2024-01-13 MED ORDER — DIPHENHYDRAMINE HCL 50 MG/ML IJ SOLN
25.0000 mg | Freq: Once | INTRAMUSCULAR | Status: AC
  Administered 2024-01-13: 25 mg via INTRAVENOUS
  Filled 2024-01-13: qty 1

## 2024-01-13 MED ORDER — ONDANSETRON HCL 4 MG/2ML IJ SOLN
4.0000 mg | Freq: Once | INTRAMUSCULAR | Status: AC
  Administered 2024-01-13: 4 mg via INTRAVENOUS
  Filled 2024-01-13: qty 2

## 2024-01-13 MED ORDER — LACTATED RINGERS IV BOLUS
1000.0000 mL | Freq: Once | INTRAVENOUS | Status: AC
  Administered 2024-01-13: 1000 mL via INTRAVENOUS

## 2024-01-13 MED ORDER — PIPERACILLIN-TAZOBACTAM 3.375 G IVPB 30 MIN
3.3750 g | Freq: Once | INTRAVENOUS | Status: AC
  Administered 2024-01-13: 3.375 g via INTRAVENOUS
  Filled 2024-01-13: qty 50

## 2024-01-13 MED ORDER — PROCHLORPERAZINE EDISYLATE 10 MG/2ML IJ SOLN
10.0000 mg | Freq: Once | INTRAMUSCULAR | Status: AC
  Administered 2024-01-13: 10 mg via INTRAVENOUS
  Filled 2024-01-13: qty 2

## 2024-01-13 MED ORDER — IOHEXOL 350 MG/ML SOLN
75.0000 mL | Freq: Once | INTRAVENOUS | Status: AC | PRN
  Administered 2024-01-13: 75 mL via INTRAVENOUS

## 2024-01-13 NOTE — ED Provider Notes (Signed)
 [facility] AT [facility] Provider Note   CSN: [id number] Arrival date & time: 01/13/24  1739     Patient presents with: Nausea, Emesis, Constipation, Abdominal Pain, and Post-op Problem   Misty Walsh is a 33 y.o. female.   33 year old female with past medical history of DVT on Eliquis  and hypertension who is status post abdominal hysterectomy in October presenting to the emergency department today with abdominal pain and vomiting.  Patient states her last bowel movement was on Friday.  Ports she has had multiple episodes of emesis over the past few days.  Has been on bilious hide she reports that she thinks that the vomit has been feculent.  She came to the emergency department today due to not being able to keep anything down.  She reports diffuse abdominal pain that is worse on the right side.  Skin to the ER for further evaluation regarding this.   Emesis Associated symptoms: abdominal pain   Constipation Associated symptoms: abdominal pain and vomiting   Abdominal Pain Associated symptoms: constipation and vomiting        Prior to Admission medications   Medication Sig Start Date End Date Taking? Authorizing Provider  amLODipine  (NORVASC ) 5 MG tablet Take 1 tablet (5 mg total) by mouth daily. 12/14/23  Yes Zina Jerilynn LABOR, MD  apixaban  (ELIQUIS ) 5 MG TABS tablet Take 2 tablets by mouth twice a day for 3 doses then start 1 tablet twice a day thereafter 12/23/23  Yes Eveline Lynwood MATSU, MD  atorvastatin  (LIPITOR ) 80 MG tablet Take 1 tablet (80 mg total) by mouth daily. 12/14/23  Yes Ozan, Jennifer, DO  busPIRone  (BUSPAR ) 5 MG tablet Take 1 tablet (5 mg total) by mouth 3 (three) times daily. 01/07/24  Yes Starkes-Perry, Majel GORMAN, FNP  chlorthalidone  (HYGROTON ) 25 MG tablet Take 1 tablet (25 mg total) by mouth daily. 12/14/23  Yes Zina Jerilynn LABOR, MD  docusate sodium  (COLACE) 100 MG capsule Take 1 capsule (100 mg total) by mouth 2 (two) times  daily. 01/08/24  Yes Jayne Vonn DEL, MD  glycopyrrolate  (ROBINUL ) 1 MG tablet Take 1 tablet (1 mg total) by mouth 3 (three) times daily. 12/14/23  Yes Zina Jerilynn LABOR, MD  losartan  (COZAAR ) 50 MG tablet Take 1 tablet (50 mg total) by mouth daily. 12/14/23  Yes Zina Jerilynn LABOR, MD  ondansetron  (ZOFRAN ) 4 MG tablet Take 1 tablet (4 mg total) by mouth every 6 (six) hours as needed for nausea or vomiting. 12/14/23  Yes Zina Jerilynn LABOR, MD  promethazine  (PHENERGAN ) 25 MG tablet Take 1 tablet (25 mg total) by mouth every 6 (six) hours as needed for refractory nausea / vomiting. 12/13/23  Yes Ozan, Jennifer, DO  sertraline  (ZOLOFT ) 50 MG tablet Take 1 tablet (50 mg total) by mouth daily. 01/07/24  Yes Starkes-Perry, Majel GORMAN, FNP  metFORMIN  (GLUCOPHAGE ) 500 MG tablet Take 1 tablet (500 mg total) by mouth 2 (two) times daily with a meal. Patient not taking: Reported on 01/13/2024 12/23/23   Jayne Vonn DEL, MD  metoCLOPramide  (REGLAN ) 10 MG tablet Take 1 tablet (10 mg total) by mouth every 8 (eight) hours for 14 days. 12/23/23 01/06/24  Jayne Vonn DEL, MD  scopolamine  (TRANSDERM-SCOP) 1 MG/3DAYS Place 1 patch (1 mg total) onto the skin every 3 (three) days. 12/15/23   Zina Jerilynn LABOR, MD  sodium chloride  flush (NS) 0.9 % SOLN Place 10 mLs into feeding tube daily AND flush drain once daily with 5 mL saline from  prefilled syringe. Patient not taking: Reported on 01/13/2024 12/23/23 02/21/24  Jayne Vonn DEL, MD    Allergies: Macrobid  [nitrofurantoin  macrocrystal]    Review of Systems  Gastrointestinal:  Positive for abdominal pain, constipation and vomiting.  All other systems reviewed and are negative.   Updated Vital Signs BP (!) 161/105   Pulse 99   Temp 97.9 F (36.6 C)   Resp 16   Ht 5' 4 (1.626 m)   Wt 128.1 kg   LMP 11/18/2023 (Exact Date)   SpO2 97%   BMI 48.48 kg/m   Physical Exam Vitals and nursing note reviewed.   Gen: Vomiting intermittently during interview Eyes: PERRL,  EOMI HEENT: no oropharyngeal swelling Neck: trachea midline Resp: clear to auscultation bilaterally Card: Tachycardic, no murmurs, rubs, or gallops Abd: Diffusely tender without guarding or rebound, surgical site below the umbilicus with granulation tissue and no dehiscence noted Extremities: no calf tenderness, no edema Vascular: 2+ radial pulses bilaterally, 2+ DP pulses bilaterally Skin: no rashes Psyc: acting appropriately   (all labs ordered are listed, but only abnormal results are displayed) Labs Reviewed  CBC WITH DIFFERENTIAL/PLATELET - Abnormal; Notable for the following components:      Result Value   Platelets 442 (*)    All other components within normal limits  I-STAT CG4 LACTIC ACID, ED - Abnormal; Notable for the following components:   Lactic Acid, Venous 2.6 (*)    All other components within normal limits  HCG, SERUM, QUALITATIVE  URINALYSIS, ROUTINE W REFLEX MICROSCOPIC  COMPREHENSIVE METABOLIC PANEL WITH GFR  I-STAT CG4 LACTIC ACID, ED    EKG: None  Radiology: CT ABDOMEN PELVIS W CONTRAST Result Date: 01/13/2024 EXAM: CT ABDOMEN AND PELVIS WITH CONTRAST 01/13/2024 09:14:33 PM TECHNIQUE: CT of the abdomen and pelvis was performed with the administration of 75 mL of iohexol  (OMNIPAQUE ) 350 MG/ML injection. Multiplanar reformatted images are provided for review. Automated exposure control, iterative reconstruction, and/or weight-based adjustment of the mA/kV was utilized to reduce the radiation dose to as low as reasonably achievable. COMPARISON: 01/05/2024 CLINICAL HISTORY: Abdominal pain, acute, nonlocalized. FINDINGS: LOWER CHEST: No acute abnormality. LIVER: The liver is unremarkable. GALLBLADDER AND BILE DUCTS: Gallbladder is unremarkable. No biliary ductal dilatation. SPLEEN: No acute abnormality. PANCREAS: No acute abnormality. ADRENAL GLANDS: No acute abnormality. KIDNEYS, URETERS AND BLADDER: No stones in the kidneys or ureters. No hydronephrosis. No  perinephric or periureteral stranding. Urinary bladder is unremarkable. GI AND BOWEL: Stomach demonstrates no acute abnormality. There is no bowel obstruction. Normal appendix. No excessive stool burden. PERITONEUM AND RETROPERITONEUM: Complex inflammatory process again noted in the right hemipelvis. Interval removal of the transgluteal drainage catheter since prior study. There continued to be small fluid collections in the right adnexal region measuring 3 x 1.2 cm on image 83, 1.9 x 1.5 cm on image 79, and 1.6 x 1.2 cm on image 80. These are similar to the prior study. No free air. VASCULATURE: Aorta is normal in caliber. LYMPH NODES: No lymphadenopathy. REPRODUCTIVE ORGANS: No acute abnormality. BONES AND SOFT TISSUES: No acute osseous abnormality. Fluid collection noted in the right rectus sheath has decreased in size, now 2.6 x 1.1 cm compared to 4.0 x 3.0 x 2.9 cm previously. IMPRESSION: 1. Complex inflammatory process in the right hemipelvis with interval removal of the transgluteal drainage catheter since prior study. 2. Small right adnexal fluid collections, unchanged from the prior study. 3. Decreased size of the right rectus sheath fluid collection. Electronically signed by: Franky Crease MD 01/13/2024 09:24  PM EST RP Workstation: [id number]     Procedures   Medications Ordered in the ED  piperacillin -tazobactam (ZOSYN ) IVPB 3.375 g (3.375 g Intravenous New Bag/Given 01/13/24 2322)    Followed by  piperacillin -tazobactam (ZOSYN ) IVPB 3.375 g (has no administration in time range)  promethazine  (PHENERGAN ) 12.5 mg in sodium chloride  0.9 % 50 mL IVPB (has no administration in time range)  lactated ringers  bolus 1,000 mL (0 mLs Intravenous Stopped 01/13/24 2336)  morphine  (PF) 4 MG/ML injection 4 mg (4 mg Intravenous Given 01/13/24 1917)  ondansetron  (ZOFRAN ) injection 4 mg (4 mg Intravenous Given 01/13/24 1917)  prochlorperazine  (COMPAZINE ) injection 10 mg (10 mg Intravenous Given 01/13/24 2047)   diphenhydrAMINE  (BENADRYL ) injection 25 mg (25 mg Intravenous Given 01/13/24 2048)  iohexol  (OMNIPAQUE ) 350 MG/ML injection 75 mL (75 mLs Intravenous Contrast Given 01/13/24 2115)                                    Medical Decision Making 33 year old female with recent abdominal hysterectomy presenting to the emergency department today with abdominal pain and vomiting.  I will further evaluate the patient here with basic labs including LFTs and a lipase to evaluate for hepatobiliary pathology or pancreatitis.  Will obtain a CT scan of her abdomen to eval for postoperative issues although suspicion for this is relatively low as her surgery was around 6 weeks ago.  This also evaluate for obstruction which is more of my concern at the moment.  Will give the patient morphine  Zofran  for symptoms as well as IV fluids.  Will reevaluate for ultimate disposition.  Given the tachycardia will also obtain a lactic acid to screen for sepsis.  The patient's labs were largely reassuring.  Her initial lactic acid was mildly elevated at 2.6.  This did clear with IV fluids.  Her CT scan did show some stable fluid collections in the pelvis and abdominal wall.  I did start the patient on Zosyn .  I did call and discussed this with Dr. Jayne from OB/GYN.  He is familiar with the patient and reviewed the scans and felt that these fluid collections are actually better than what she was admitted for recently.  He did not think that she required admission for this in particular.  He informed me that if she did require admission that the OB/GYN service would not admit the patient because this does not seem to be related to her recent surgery or issues.  The patient's CMP is pending at the time of signout.  She has required multiple doses of antiemetics here and continues to vomit.  She will require admission to the medicine service for intractable vomiting.  Amount and/or Complexity of Data Reviewed Labs: ordered. Radiology:  ordered.  Risk Prescription drug management.        Final diagnoses:  Pelvic fluid collection  Intractable vomiting with nausea    ED Discharge Orders     None          Ula Prentice SAUNDERS, MD 01/13/24 2356

## 2024-01-13 NOTE — Telephone Encounter (Signed)
 Called patient due to admin staff stated patient is unable to keep anything down and having nausea and vomiting. Asked about medication that was sent over during discharge: Colace PRN. Patient voiced that it does not help. Patient reported that she has not been able to keep anything down and constantly throwing up. Per chart review, patient developed post op ileus and had GI consult during her hospital stay. Advised patient if she continue to not keep anything down, may have concerns for obstruction, advised going to the hospital is recommended. Patient verbalized understanding.   Rosaline, RN

## 2024-01-13 NOTE — ED Notes (Signed)
 PT states that she is a hard stick and usually requires a picc line, IV team consulted

## 2024-01-13 NOTE — Telephone Encounter (Signed)
 Returned patient's call. States she is currently on her way to the hospital as she is unable to keep anything down despite taking prescribed nausea medication. Encouraged patient to schedule follow-up as soon as possible once discharged.

## 2024-01-13 NOTE — ED Triage Notes (Signed)
 Pt coming in pov from home. Pt had surgery on 11/13 for ovarian cysts . Pt reporting nausea vomiting. Pt reports last bm on Friday. Pt family states that he thinks she is having a bowel movement out of her mouth. Pt reports blood in her vomit and pt reports emesis is brown in color

## 2024-01-13 NOTE — ED Notes (Signed)
 Pt gives verbal consent for mse

## 2024-01-13 NOTE — Telephone Encounter (Signed)
 Patient called stated that she is not able to come to [facility] for appt and wants to see Doctor in [place] if not day sometime this week and something after 10:00 am as they are driving from [place]. Asking for a call back today at [phone] patient also states that she was sick last night.

## 2024-01-14 ENCOUNTER — Encounter (HOSPITAL_COMMUNITY): Payer: Self-pay | Admitting: Obstetrics and Gynecology

## 2024-01-14 DIAGNOSIS — E876 Hypokalemia: Secondary | ICD-10-CM | POA: Diagnosis present

## 2024-01-14 DIAGNOSIS — Z8249 Family history of ischemic heart disease and other diseases of the circulatory system: Secondary | ICD-10-CM | POA: Diagnosis not present

## 2024-01-14 DIAGNOSIS — E872 Acidosis, unspecified: Secondary | ICD-10-CM | POA: Diagnosis present

## 2024-01-14 DIAGNOSIS — Z7984 Long term (current) use of oral hypoglycemic drugs: Secondary | ICD-10-CM | POA: Diagnosis not present

## 2024-01-14 DIAGNOSIS — Z90710 Acquired absence of both cervix and uterus: Secondary | ICD-10-CM | POA: Diagnosis not present

## 2024-01-14 DIAGNOSIS — E1169 Type 2 diabetes mellitus with other specified complication: Secondary | ICD-10-CM | POA: Diagnosis present

## 2024-01-14 DIAGNOSIS — Z86711 Personal history of pulmonary embolism: Secondary | ICD-10-CM

## 2024-01-14 DIAGNOSIS — R112 Nausea with vomiting, unspecified: Secondary | ICD-10-CM

## 2024-01-14 DIAGNOSIS — Z7901 Long term (current) use of anticoagulants: Secondary | ICD-10-CM | POA: Diagnosis not present

## 2024-01-14 DIAGNOSIS — Z6841 Body Mass Index (BMI) 40.0 and over, adult: Secondary | ICD-10-CM | POA: Diagnosis not present

## 2024-01-14 DIAGNOSIS — E7849 Other hyperlipidemia: Secondary | ICD-10-CM

## 2024-01-14 DIAGNOSIS — E860 Dehydration: Secondary | ICD-10-CM | POA: Diagnosis present

## 2024-01-14 DIAGNOSIS — R188 Other ascites: Secondary | ICD-10-CM | POA: Diagnosis present

## 2024-01-14 DIAGNOSIS — I10 Essential (primary) hypertension: Secondary | ICD-10-CM | POA: Diagnosis present

## 2024-01-14 DIAGNOSIS — R1011 Right upper quadrant pain: Secondary | ICD-10-CM | POA: Diagnosis present

## 2024-01-14 DIAGNOSIS — E119 Type 2 diabetes mellitus without complications: Secondary | ICD-10-CM

## 2024-01-14 DIAGNOSIS — Z833 Family history of diabetes mellitus: Secondary | ICD-10-CM | POA: Diagnosis not present

## 2024-01-14 DIAGNOSIS — Z820 Family history of epilepsy and other diseases of the nervous system: Secondary | ICD-10-CM | POA: Diagnosis not present

## 2024-01-14 DIAGNOSIS — I160 Hypertensive urgency: Secondary | ICD-10-CM | POA: Diagnosis present

## 2024-01-14 DIAGNOSIS — Z825 Family history of asthma and other chronic lower respiratory diseases: Secondary | ICD-10-CM | POA: Diagnosis not present

## 2024-01-14 DIAGNOSIS — F411 Generalized anxiety disorder: Secondary | ICD-10-CM | POA: Diagnosis present

## 2024-01-14 DIAGNOSIS — Z823 Family history of stroke: Secondary | ICD-10-CM | POA: Diagnosis not present

## 2024-01-14 DIAGNOSIS — Z87891 Personal history of nicotine dependence: Secondary | ICD-10-CM | POA: Diagnosis not present

## 2024-01-14 DIAGNOSIS — F909 Attention-deficit hyperactivity disorder, unspecified type: Secondary | ICD-10-CM | POA: Diagnosis present

## 2024-01-14 DIAGNOSIS — K5900 Constipation, unspecified: Secondary | ICD-10-CM | POA: Diagnosis present

## 2024-01-14 DIAGNOSIS — Z79899 Other long term (current) drug therapy: Secondary | ICD-10-CM | POA: Diagnosis not present

## 2024-01-14 DIAGNOSIS — Z818 Family history of other mental and behavioral disorders: Secondary | ICD-10-CM | POA: Diagnosis not present

## 2024-01-14 DIAGNOSIS — N179 Acute kidney failure, unspecified: Secondary | ICD-10-CM | POA: Diagnosis present

## 2024-01-14 LAB — URINALYSIS, ROUTINE W REFLEX MICROSCOPIC
Bilirubin Urine: NEGATIVE
Glucose, UA: NEGATIVE mg/dL
Hgb urine dipstick: NEGATIVE
Ketones, ur: NEGATIVE mg/dL
Leukocytes,Ua: NEGATIVE
Nitrite: NEGATIVE
Protein, ur: 30 mg/dL — AB
Specific Gravity, Urine: >1.046 — ABNORMAL HIGH (ref 1.005–1.030)
pH: 5 (ref 5.0–8.0)

## 2024-01-14 LAB — COMPREHENSIVE METABOLIC PANEL WITH GFR
ALT: 25 U/L (ref 0–44)
ALT: 19 U/L (ref 0–44)
AST: 31 U/L (ref 15–41)
AST: 20 U/L (ref 15–41)
Albumin: 3.8 g/dL (ref 3.5–5.0)
Albumin: 3.8 g/dL (ref 3.5–5.0)
Alkaline Phosphatase: 57 U/L (ref 38–126)
Alkaline Phosphatase: 59 U/L (ref 38–126)
Anion gap: 14 (ref 5–15)
Anion gap: 18 — ABNORMAL HIGH (ref 5–15)
BUN: 8 mg/dL (ref 6–20)
BUN: 10 mg/dL (ref 6–20)
CO2: 27 mmol/L (ref 22–32)
CO2: 22 mmol/L (ref 22–32)
Calcium: 9.3 mg/dL (ref 8.9–10.3)
Calcium: 9.8 mg/dL (ref 8.9–10.3)
Chloride: 96 mmol/L — ABNORMAL LOW (ref 98–111)
Chloride: 96 mmol/L — ABNORMAL LOW (ref 98–111)
Creatinine, Ser: 1.18 mg/dL — ABNORMAL HIGH (ref 0.44–1.00)
Creatinine, Ser: 1.1 mg/dL — ABNORMAL HIGH (ref 0.44–1.00)
GFR, Estimated: >60 mL/min (ref >60)
GFR, Estimated: >60 mL/min (ref >60)
Glucose, Bld: 114 mg/dL — ABNORMAL HIGH (ref 70–99)
Glucose, Bld: 104 mg/dL — ABNORMAL HIGH (ref 70–99)
Potassium: 3.8 mmol/L (ref 3.5–5.1)
Potassium: 3.6 mmol/L (ref 3.5–5.1)
Sodium: 137 mmol/L (ref 135–145)
Sodium: 136 mmol/L (ref 135–145)
Total Bilirubin: 1.5 mg/dL — ABNORMAL HIGH (ref 0.0–1.2)
Total Bilirubin: 1.2 mg/dL (ref 0.0–1.2)
Total Protein: 7.8 g/dL (ref 6.5–8.1)
Total Protein: 7.9 g/dL (ref 6.5–8.1)

## 2024-01-14 LAB — CBG MONITORING, ED
Glucose-Capillary: 126 mg/dL — ABNORMAL HIGH (ref 70–99)
Glucose-Capillary: 110 mg/dL — ABNORMAL HIGH (ref 70–99)
Glucose-Capillary: 114 mg/dL — ABNORMAL HIGH (ref 70–99)

## 2024-01-14 LAB — CBC
HCT: 37.6 % (ref 36.0–46.0)
Hemoglobin: 12.1 g/dL (ref 12.0–15.0)
MCH: 31 pg (ref 26.0–34.0)
MCHC: 32.2 g/dL (ref 30.0–36.0)
MCV: 96.4 fL (ref 80.0–100.0)
Platelets: 365 K/uL (ref 150–400)
RBC: 3.9 MIL/uL (ref 3.87–5.11)
RDW: 13.4 % (ref 11.5–15.5)
WBC: 6.8 K/uL (ref 4.0–10.5)
nRBC: 0 % (ref 0.0–0.2)

## 2024-01-14 LAB — GLUCOSE, CAPILLARY
Glucose-Capillary: 151 mg/dL — ABNORMAL HIGH (ref 70–99)
Glucose-Capillary: 117 mg/dL — ABNORMAL HIGH (ref 70–99)

## 2024-01-14 MED ORDER — ATORVASTATIN CALCIUM 80 MG PO TABS
80.0000 mg | ORAL_TABLET | Freq: Every day | ORAL | Status: DC
  Administered 2024-01-14: 80 mg via ORAL
  Filled 2024-01-14: qty 2

## 2024-01-14 MED ORDER — INSULIN ASPART 100 UNIT/ML IJ SOLN
0.0000 [IU] | Freq: Three times a day (TID) | INTRAMUSCULAR | Status: DC
  Administered 2024-01-14 – 2024-01-20 (×5): 1 [IU] via SUBCUTANEOUS
  Filled 2024-01-14 (×6): qty 1

## 2024-01-14 MED ORDER — MORPHINE SULFATE (PF) 2 MG/ML IV SOLN: 2.0000 mg | INTRAVENOUS | Status: DC | PRN

## 2024-01-14 MED ORDER — METOCLOPRAMIDE HCL 5 MG/ML IJ SOLN
20.0000 mg | Freq: Once | INTRAVENOUS | Status: AC
  Administered 2024-01-14: 20 mg via INTRAVENOUS
  Filled 2024-01-14: qty 4

## 2024-01-14 MED ORDER — MORPHINE SULFATE (PF) 2 MG/ML IV SOLN
2.0000 mg | Freq: Once | INTRAVENOUS | Status: AC
  Administered 2024-01-14: 2 mg via INTRAVENOUS
  Filled 2024-01-14: qty 1

## 2024-01-14 MED ORDER — OXYCODONE HCL 5 MG PO TABS
5.0000 mg | ORAL_TABLET | ORAL | Status: DC | PRN
  Filled 2024-01-14: qty 1

## 2024-01-14 MED ORDER — LACTATED RINGERS IV SOLN: INTRAVENOUS | Status: DC

## 2024-01-14 MED ORDER — ONDANSETRON HCL 4 MG PO TABS: 4.0000 mg | ORAL_TABLET | Freq: Four times a day (QID) | ORAL | Status: DC | PRN

## 2024-01-14 MED ORDER — BUSPIRONE HCL 5 MG PO TABS
5.0000 mg | ORAL_TABLET | Freq: Three times a day (TID) | ORAL | Status: DC
  Administered 2024-01-14 – 2024-01-21 (×13): 5 mg via ORAL
  Filled 2024-01-14 (×16): qty 1

## 2024-01-14 MED ORDER — HYDRALAZINE HCL 20 MG/ML IJ SOLN
10.0000 mg | Freq: Four times a day (QID) | INTRAMUSCULAR | Status: DC | PRN
  Administered 2024-01-14: 10 mg via INTRAVENOUS
  Filled 2024-01-14: qty 1

## 2024-01-14 MED ORDER — ACETAMINOPHEN 650 MG RE SUPP: 650.0000 mg | Freq: Four times a day (QID) | RECTAL | Status: DC | PRN

## 2024-01-14 MED ORDER — SODIUM CHLORIDE 0.9% FLUSH
3.0000 mL | Freq: Two times a day (BID) | INTRAVENOUS | Status: DC
  Administered 2024-01-14 – 2024-01-21 (×14): 3 mL via INTRAVENOUS

## 2024-01-14 MED ORDER — LABETALOL HCL 5 MG/ML IV SOLN
10.0000 mg | Freq: Four times a day (QID) | INTRAVENOUS | Status: DC | PRN
  Administered 2024-01-14 – 2024-01-15 (×2): 10 mg via INTRAVENOUS
  Filled 2024-01-14 (×2): qty 4

## 2024-01-14 MED ORDER — ONDANSETRON HCL 4 MG/2ML IJ SOLN: 4.0000 mg | Freq: Four times a day (QID) | INTRAMUSCULAR | Status: DC | PRN

## 2024-01-14 MED ORDER — APIXABAN 5 MG PO TABS
5.0000 mg | ORAL_TABLET | Freq: Two times a day (BID) | ORAL | Status: DC
  Administered 2024-01-14 – 2024-01-21 (×11): 5 mg via ORAL
  Filled 2024-01-14 (×13): qty 1

## 2024-01-14 MED ORDER — SODIUM CHLORIDE 0.9 % IV SOLN: 8.0000 mg | Freq: Four times a day (QID) | INTRAVENOUS | Status: DC | PRN

## 2024-01-14 MED ORDER — SODIUM CHLORIDE 0.9 % IV SOLN
25.0000 mg | Freq: Four times a day (QID) | INTRAVENOUS | Status: DC | PRN
  Administered 2024-01-14 – 2024-01-20 (×7): 25 mg via INTRAVENOUS
  Filled 2024-01-14 (×2): qty 1
  Filled 2024-01-14: qty 25
  Filled 2024-01-14 (×4): qty 1

## 2024-01-14 MED ORDER — SODIUM CHLORIDE 0.9% FLUSH: 3.0000 mL | INTRAVENOUS | Status: DC | PRN

## 2024-01-14 MED ORDER — AMLODIPINE BESYLATE 5 MG PO TABS
5.0000 mg | ORAL_TABLET | Freq: Every day | ORAL | Status: DC
  Administered 2024-01-14 – 2024-01-21 (×7): 5 mg via ORAL
  Filled 2024-01-14 (×7): qty 1

## 2024-01-14 MED ORDER — ONDANSETRON HCL 4 MG PO TABS
8.0000 mg | ORAL_TABLET | Freq: Four times a day (QID) | ORAL | Status: DC | PRN
  Filled 2024-01-14: qty 2

## 2024-01-14 MED ORDER — SODIUM CHLORIDE 0.9% FLUSH
3.0000 mL | Freq: Two times a day (BID) | INTRAVENOUS | Status: DC
  Administered 2024-01-14 (×3): 3 mL via INTRAVENOUS

## 2024-01-14 MED ORDER — HYDRALAZINE HCL 20 MG/ML IJ SOLN
20.0000 mg | Freq: Four times a day (QID) | INTRAMUSCULAR | Status: DC | PRN
  Administered 2024-01-15: 20 mg via INTRAVENOUS
  Filled 2024-01-14: qty 1

## 2024-01-14 MED ORDER — SODIUM CHLORIDE 0.9 % IV SOLN: 250.0000 mL | INTRAVENOUS | Status: DC | PRN

## 2024-01-14 MED ORDER — ACETAMINOPHEN 325 MG PO TABS: 650.0000 mg | ORAL_TABLET | Freq: Four times a day (QID) | ORAL | Status: DC | PRN

## 2024-01-14 MED ORDER — SODIUM CHLORIDE 0.9 % IV SOLN
8.0000 mg | Freq: Four times a day (QID) | INTRAVENOUS | Status: DC | PRN
  Administered 2024-01-14 – 2024-01-17 (×6): 8 mg via INTRAVENOUS
  Filled 2024-01-14 (×6): qty 4

## 2024-01-14 MED ORDER — INSULIN ASPART 100 UNIT/ML IJ SOLN: 0.0000 [IU] | Freq: Every day | INTRAMUSCULAR | Status: DC

## 2024-01-14 MED ORDER — PIPERACILLIN-TAZOBACTAM 3.375 G IVPB
3.3750 g | Freq: Three times a day (TID) | INTRAVENOUS | Status: DC
  Administered 2024-01-14: 3.375 g via INTRAVENOUS
  Filled 2024-01-14: qty 50

## 2024-01-14 NOTE — ED Notes (Signed)
 Pt resting comfortably at this time. Pt denies any needs at this time. VS obtained.

## 2024-01-14 NOTE — ED Notes (Signed)
 Pt vomited on herself. Pt cleansed with soap and water. Bedding changed. Pt reports she needs her spitting medicine. Provider notified of recent request and episode of vomiting.

## 2024-01-14 NOTE — Hospital Course (Addendum)
 Misty Walsh is a 33 y.o. female with a history of hyperlipidemia, diabetes mellitus type 2, PE on Eliquis , recent hysterectomy complicated by tubo-ovarian abscess.  Patient presented secondary to nausea and vomiting of unclear etiology. Patient managed symptomatically with antiemetics and eventual improvement of symptoms. Patient tolerating a diet prior to discharge.

## 2024-01-14 NOTE — Progress Notes (Signed)
    Patient: Misty Walsh FMW:[id number] DOB: 1990-10-04      Brief hospital course: 33 y.o.  F with MO, DM, PE on Eliquis , HTN, HLD and recent hysterectomy complicated by tubo-ovarian abscess, who returns with nausea and vomiting.  CT in the ER shows resolving inflammatory and post-operative changes.  WBC normal.    This is a no charge note, for further details, please see the H&P by my partner, Dr. Sundil from earlier today.   Principal Problem:   Intractable nausea and vomiting Active Problems:   Hyperlipidemia   Essential hypertension   Non-insulin  dependent type 2 diabetes mellitus (HCC)   History of hysterectomy   History of pulmonary embolism    WBC normal.  CT abdomen reassuring.    Still unable to take any food PO - Continue IV fluids - Continue anti-emetics - PRN BP management       Physical Exam: BP (!) 190/96 (BP Location: Left Wrist)   Pulse (!) 110   Temp 98.9 F (37.2 C) (Axillary)   Resp (!) 22   Ht 5' 4 (1.626 m)   Wt 128.1 kg   LMP 11/18/2023 (Exact Date)   SpO2 100%   BMI 48.48 kg/m   Patient seen and examined.     Family Communication:         Author: Lonni SHAUNNA Dalton, MD 01/14/2024 5:09 PM

## 2024-01-14 NOTE — ED Notes (Signed)
 Pt laying in bed with eyes closed. Pt encouraged to drink but is refusing at this time. Provider made aware.

## 2024-01-14 NOTE — ED Provider Notes (Signed)
 Received patient in turnover from Dr. Ula.  Please see their note for further details of Hx, PE.  Briefly patient is a 33 y.o. female with a Nausea, Emesis, Constipation, Abdominal Pain, and Post-op Problem .  Intractable n/v.  Plan for medicine admit. [name] Quale, Rolan, DO 01/14/24 [phone]

## 2024-01-14 NOTE — ED Notes (Signed)
 Pt up to bsc and vomiting at this time. Urine sample obtained. Clear tray ordered but has not arrived. Provider made aware of pt status.

## 2024-01-14 NOTE — ED Notes (Signed)
 Pt noted with flat affect and soft spoken. Pt able to maintain eye contact when speaking with writer but limited on what she says. Pt continues to make needs known.

## 2024-01-14 NOTE — H&P (Signed)
 History and Physical    Misty Walsh FMW:[id number] DOB: May 19, 1990 DOA: 01/13/2024  PCP: Misty Search, PA-C   Patient coming from: Home   Chief Complaint:  Chief Complaint  Patient presents with   Nausea   Emesis   Constipation   Abdominal Pain   Post-op Problem   ED TRIAGE note:  Pt coming in pov from home. Pt had surgery on 11/13 for ovarian cysts . Pt reporting nausea vomiting. Pt reports last bm on Friday. Pt family states that he thinks she is having a bowel movement out of her mouth. Pt reports blood in her vomit and pt reports emesis is brown in color      HPI:  Misty Walsh is a 33 y.o. female with medical history of recent hysterectomy complicated by tubo-ovarian abscess, INDM type II, pulmonary embolism on Eliquis , hyperlipidemia and essential hypertension presented to emergency department presented to emergency department with complaining of abdominal pain, nausea and vomiting.  Patient reported that she has last bowel movement on Friday.  Since then patient has multiple episode of emesis over the course of past few days.  Per patient she is concerned that she is vomiting stool per mouth.  Complaining about right-sided abdominal pain that has been progressively getting worse. Patient denies any fever, chill, chest pain, shortness of breath, cough and diarrhea.  Patient has been recently admitted for 10/13 to 11/14 for ex lap hysterectomy complicated by development of tubo-ovarian abscess required washout and JP drain placement and again second admission 11/17 to 11/20 for intractable nausea vomiting.    Reported generalized abdominal pain.  Denies any fever, chill, chest pain, shortness of breath, palpitation, UTI and URI symptoms.   ED Course:  At presentation to ED patient is tachycardic and borderline hypertensive.  Afebrile. Lab work, initial lactic acid 2.6 improved to 1.3. CMP showing low bicarb 96, creatinine 1.18 which is at his baseline.  CBC  unremarkable.  UA pending.  CT abdomen pelvis showing:  Complex inflammatory process in the right hemipelvis with interval removal of the transgluteal drainage catheter since prior study. 2. Small right adnexal fluid collections, unchanged from the prior study. 3. Decreased size of the right rectus sheath fluid collection.  In the ED patient has been given Benadryl , 1 L of LR bolus, morphine , Zofran , and Phenergan .  Patient also started on Zosyn . Initially patient received Zosyn  with concern for intra-abdominal infection.  Dr. TEE discussed case with gynecologist Dr. Jayne who reviewed the imaging and stated that the fluid collection are actually better than what she was admitted recently, per OB/GYN did not feel that patient needed any admission for this particular and if patient requires admission OB/GYN service would not be admit the patient because this is does not seem to be related to her recent surgery or issue.  Hospitalist consulted for further evaluation management of intractable nausea-vomiting and complex inflammatory process versus early infectious formation from recent surgery.    Significant labs in the ED: Lab Orders         CBC with Differential         hCG, serum, qualitative         Comprehensive metabolic panel         Comprehensive metabolic panel         CBC         Urinalysis, Routine w reflex microscopic -Urine, Clean Catch         I-Stat CG4 Lactic Acid  Review of Systems:  Review of Systems  Constitutional:  Negative for chills, fever and weight loss.  Respiratory:  Negative for cough, sputum production and shortness of breath.   Cardiovascular:  Negative for chest pain and palpitations.  Gastrointestinal:  Positive for abdominal pain, nausea and vomiting. Negative for constipation, diarrhea and heartburn.  Genitourinary:  Negative for dysuria, flank pain, frequency, hematuria and urgency.  Musculoskeletal:  Negative for back pain, joint pain, myalgias  and neck pain.  Neurological:  Negative for dizziness and headaches.  Psychiatric/Behavioral:  The patient is not nervous/anxious.     Past Medical History:  Diagnosis Date   Anxiety    Back pain    Chlamydia    Depression med made her navel itching and  made her sleepy so she quit taking them   Diabetes mellitus without complication (HCC)    Edema, lower extremity    Elevated cholesterol    Fibrocystic breast changes of both breasts    GERD (gastroesophageal reflux disease)    History of cesarean section, classical 12/18/2012   2014    Human papilloma virus    Hypertension    Insomnia    Joint pain    Moderate dysplasia of cervix    Numbness    right arm to hand   Ovarian cyst    Prediabetes    Tubo-ovarian abscess 12/02/2023   Vitamin D  deficiency     Past Surgical History:  Procedure Laterality Date   ABDOMINAL HYSTERECTOMY N/A 12/08/2023   Procedure: HYSTERECTOMY, ABDOMINAL;  Surgeon: Marilynn Nest, DO;  Location: [facility] OR;  Service: Gynecology;  Laterality: N/A;  W/ EXPLORATORY LAPAROTOMY   BREAST BIOPSY Left 2012   benign fibroadeoma   BREAST SURGERY     CESAREAN SECTION N/A 12/13/2012   Procedure: PRIMARY CESAREAN SECTION;  Surgeon: Aida DELENA Na, MD;  Location: [facility] ORS;  Service: Obstetrics;  Laterality: N/A;   ESOPHAGOGASTRODUODENOSCOPY N/A 12/29/2023   Procedure: EGD (ESOPHAGOGASTRODUODENOSCOPY);  Surgeon: Leigh Elspeth SQUIBB, MD;  Location: [facility] ENDOSCOPY;  Service: Gastroenterology;  Laterality: N/A;   IR CV LINE INJECTION  01/07/2024   IR US  GUIDE BX ASP/DRAIN  01/01/2024   IR US  GUIDE BX ASP/DRAIN  01/01/2024   LEEP       reports that she quit smoking about 4 years ago. Her smoking use included cigarettes. She started smoking about 9 years ago. She has a 1.3 pack-year smoking history. She has never used smokeless tobacco. She reports that she does not currently use alcohol. She reports that she does not use drugs.  Allergies  Allergen Reactions    Macrobid  [Nitrofurantoin  Macrocrystal] Itching    Caused the patient to feel hot.    Family History  Problem Relation Age of Onset   Diabetes Mother    Heart disease Mother    Hypertension Mother    Depression Mother    Stroke Mother    Arthritis Mother    Anxiety disorder Mother    Stroke Father    High blood pressure Father    High Cholesterol Father    Depression Father    Anxiety disorder Father    Obesity Father    Bipolar disorder Father    Asthma Sister    Kidney disease Brother        genetic condition   Hypertension Maternal Grandmother    Dementia Maternal Grandmother    Heart disease Maternal Grandmother    Diabetes Paternal Grandmother    Diabetes Maternal Aunt    Hypertension Maternal  Aunt    Asthma Maternal Aunt    Epilepsy Maternal Aunt     Prior to Admission medications   Medication Sig Start Date End Date Taking? Authorizing Provider  amLODipine  (NORVASC ) 5 MG tablet Take 1 tablet (5 mg total) by mouth daily. 12/14/23  Yes Zina Jerilynn LABOR, MD  apixaban  (ELIQUIS ) 5 MG TABS tablet Take 2 tablets by mouth twice a day for 3 doses then start 1 tablet twice a day thereafter 12/23/23  Yes Eveline Lynwood MATSU, MD  atorvastatin  (LIPITOR ) 80 MG tablet Take 1 tablet (80 mg total) by mouth daily. 12/14/23  Yes Ozan, Jennifer, DO  busPIRone  (BUSPAR ) 5 MG tablet Take 1 tablet (5 mg total) by mouth 3 (three) times daily. 01/07/24  Yes Starkes-Perry, Majel RAMAN, FNP  chlorthalidone  (HYGROTON ) 25 MG tablet Take 1 tablet (25 mg total) by mouth daily. 12/14/23  Yes Zina Jerilynn LABOR, MD  docusate sodium  (COLACE) 100 MG capsule Take 1 capsule (100 mg total) by mouth 2 (two) times daily. 01/08/24  Yes Jayne Vonn DEL, MD  glycopyrrolate  (ROBINUL ) 1 MG tablet Take 1 tablet (1 mg total) by mouth 3 (three) times daily. 12/14/23  Yes Zina Jerilynn LABOR, MD  losartan  (COZAAR ) 50 MG tablet Take 1 tablet (50 mg total) by mouth daily. 12/14/23  Yes Zina Jerilynn LABOR, MD  ondansetron  (ZOFRAN ) 4 MG  tablet Take 1 tablet (4 mg total) by mouth every 6 (six) hours as needed for nausea or vomiting. 12/14/23  Yes Zina Jerilynn LABOR, MD  promethazine  (PHENERGAN ) 25 MG tablet Take 1 tablet (25 mg total) by mouth every 6 (six) hours as needed for refractory nausea / vomiting. 12/13/23  Yes Ozan, Jennifer, DO  sertraline  (ZOLOFT ) 50 MG tablet Take 1 tablet (50 mg total) by mouth daily. 01/07/24  Yes Starkes-Perry, Majel RAMAN, FNP  metFORMIN  (GLUCOPHAGE ) 500 MG tablet Take 1 tablet (500 mg total) by mouth 2 (two) times daily with a meal. Patient not taking: Reported on 01/13/2024 12/23/23   Jayne Vonn DEL, MD  scopolamine  (TRANSDERM-SCOP) 1 MG/3DAYS Place 1 patch (1 mg total) onto the skin every 3 (three) days. 12/15/23   Zina Jerilynn LABOR, MD     Physical Exam: Vitals:   01/13/24 1757 01/13/24 2140 01/13/24 2230  BP: (!) 138/121  (!) 161/105  Pulse: (!) 115  99  Resp: 18  16  Temp: 98.7 F (37.1 C)  97.9 F (36.6 C)  TempSrc: Oral    SpO2: 99%  97%  Weight:  128.1 kg   Height:  5' 4 (1.626 m)     Physical Exam Vitals and nursing note reviewed.  Constitutional:      Appearance: She is obese. She is not ill-appearing.  Cardiovascular:     Rate and Rhythm: Normal rate and regular rhythm.     Heart sounds: Normal heart sounds.  Abdominal:     General: Bowel sounds are normal.     Palpations: Abdomen is soft.     Tenderness: There is no abdominal tenderness. There is no guarding or rebound.  Skin:    General: Skin is warm.     Capillary Refill: Capillary refill takes less than 2 seconds.  Neurological:     Mental Status: She is alert and oriented to person, place, and time.  Psychiatric:        Mood and Affect: Mood normal.      Labs on Admission: I have personally reviewed following labs and imaging studies  CBC: Recent Labs  Lab  01/08/24 0619 01/13/24 1856  WBC 6.4 7.0  NEUTROABS 3.8 4.7  HGB 10.3* 12.5  HCT 31.4* 38.2  MCV 95.7 94.6  PLT 308 442*   Basic Metabolic  Panel: Recent Labs  Lab 01/08/24 0619 01/13/24 2325  NA 135 137  K 3.6 3.8  CL 97* 96*  CO2 26 27  GLUCOSE 91 114*  BUN 5* 8  CREATININE 1.30* 1.18*  CALCIUM  9.1 9.3   GFR: Estimated Creatinine Clearance: 89.2 mL/min (A) (by C-G formula based on SCr of 1.18 mg/dL (H)). Liver Function Tests: Recent Labs  Lab 01/13/24 2325  AST 31  ALT 25  ALKPHOS 57  BILITOT 1.5*  PROT 7.8  ALBUMIN 3.8   No results for input(s): LIPASE, AMYLASE in the last 168 hours. No results for input(s): AMMONIA in the last 168 hours. Coagulation Profile: No results for input(s): INR, PROTIME in the last 168 hours. Cardiac Enzymes: No results for input(s): CKTOTAL, CKMB, CKMBINDEX, TROPONINI, TROPONINIHS in the last 168 hours. BNP (last 3 results) No results for input(s): BNP in the last 8760 hours. HbA1C: No results for input(s): HGBA1C in the last 72 hours. CBG: Recent Labs  Lab 01/07/24 1017 01/07/24 2150 01/08/24 0833  GLUCAP 113* 96 99   Lipid Profile: No results for input(s): CHOL, HDL, LDLCALC, TRIG, CHOLHDL, LDLDIRECT in the last 72 hours. Thyroid Function Tests: No results for input(s): TSH, T4TOTAL, FREET4, T3FREE, THYROIDAB in the last 72 hours. Anemia Panel: No results for input(s): VITAMINB12, FOLATE, FERRITIN, TIBC, IRON, RETICCTPCT in the last 72 hours. Urine analysis:    Component Value Date/Time   COLORURINE YELLOW 01/06/2024 1940   APPEARANCEUR HAZY (A) 01/06/2024 1940   LABSPEC 1.017 01/06/2024 1940   PHURINE 7.0 01/06/2024 1940   GLUCOSEU NEGATIVE 01/06/2024 1940   HGBUR MODERATE (A) 01/06/2024 1940   BILIRUBINUR NEGATIVE 01/06/2024 1940   BILIRUBINUR NEGATIVE 11/11/2012 1112   KETONESUR NEGATIVE 01/06/2024 1940   PROTEINUR NEGATIVE 01/06/2024 1940   UROBILINOGEN 0.2 08/17/2021 1106   NITRITE NEGATIVE 01/06/2024 1940   LEUKOCYTESUR NEGATIVE 01/06/2024 1940    Radiological Exams on Admission: I have  personally reviewed images CT ABDOMEN PELVIS W CONTRAST Result Date: 01/13/2024 EXAM: CT ABDOMEN AND PELVIS WITH CONTRAST 01/13/2024 09:14:33 PM TECHNIQUE: CT of the abdomen and pelvis was performed with the administration of 75 mL of iohexol  (OMNIPAQUE ) 350 MG/ML injection. Multiplanar reformatted images are provided for review. Automated exposure control, iterative reconstruction, and/or weight-based adjustment of the mA/kV was utilized to reduce the radiation dose to as low as reasonably achievable. COMPARISON: 01/05/2024 CLINICAL HISTORY: Abdominal pain, acute, nonlocalized. FINDINGS: LOWER CHEST: No acute abnormality. LIVER: The liver is unremarkable. GALLBLADDER AND BILE DUCTS: Gallbladder is unremarkable. No biliary ductal dilatation. SPLEEN: No acute abnormality. PANCREAS: No acute abnormality. ADRENAL GLANDS: No acute abnormality. KIDNEYS, URETERS AND BLADDER: No stones in the kidneys or ureters. No hydronephrosis. No perinephric or periureteral stranding. Urinary bladder is unremarkable. GI AND BOWEL: Stomach demonstrates no acute abnormality. There is no bowel obstruction. Normal appendix. No excessive stool burden. PERITONEUM AND RETROPERITONEUM: Complex inflammatory process again noted in the right hemipelvis. Interval removal of the transgluteal drainage catheter since prior study. There continued to be small fluid collections in the right adnexal region measuring 3 x 1.2 cm on image 83, 1.9 x 1.5 cm on image 79, and 1.6 x 1.2 cm on image 80. These are similar to the prior study. No free air. VASCULATURE: Aorta is normal in caliber. LYMPH NODES: No lymphadenopathy. REPRODUCTIVE ORGANS:  No acute abnormality. BONES AND SOFT TISSUES: No acute osseous abnormality. Fluid collection noted in the right rectus sheath has decreased in size, now 2.6 x 1.1 cm compared to 4.0 x 3.0 x 2.9 cm previously. IMPRESSION: 1. Complex inflammatory process in the right hemipelvis with interval removal of the transgluteal  drainage catheter since prior study. 2. Small right adnexal fluid collections, unchanged from the prior study. 3. Decreased size of the right rectus sheath fluid collection. Electronically signed by: Franky Crease MD 01/13/2024 09:24 PM EST RP Workstation: [id number]     Assessment/Plan: Principal Problem:   Intractable nausea and vomiting Active Problems:   Hyperlipidemia   Essential hypertension   Non-insulin  dependent type 2 diabetes mellitus (HCC)   History of hysterectomy   History of pulmonary embolism    Assessment and Plan: Intractable nausea and vomiting Lactic acidosis  2nd secondary to dehydration from intractable nausea vomiting Postsurgical inflammatory changes versus early development of infection History of recent hysterectomy complicated by tubo-ovarian abscess  -Presented to emergency department complaining of intractable nausea vomiting.  Patient reported initially the vomiting was bilious in nature and later on she has been vomiting feculent to her mouth.  Denies any fever, chill and abdominal pain.  Visitation to ED patient is borderline hypertensive otherwise hemodynamically stable.  Afebrile. -Patient recently admitted 10/13 to 11/14 for ex lap hysterectomy complicated by development of tubo-ovarian abscess required washout and JP drain placement and again second admission 11/17 to 11/20 for treatment of  intractable nausea vomiting.   - Lab work, CBC unremarkable.  CMP showing persistent elevated creatinine 1.18 from recent episodes of AKI however renal function has been improved otherwise unremarkable.  Initial lactic acid 2.6 improved to 1.9 with 1 L of LR bolus. - CT abdomen pelvis showing complex inflammatory process of the right hemipelvis with interval removal of the transgluteal drainage catheter,Small right adnexal fluid collections, unchanged from the prior study and decrease size of the right rectus sheath fluid collection. - ED physician discussed case with  on-call OB/GYN, per OB/GYN at this time there is no indication to admit patient from postsurgical complication perspective and per OB/GYN reviewed imaging there is no concern for active complication as well. - In the ED patient received Benadryl , Zofran  morphine  and Compazine . -Vomiting has been improved patient is still feeling nauseated - Continue IV Zofran  and Phenergan . - Given CT scan showing some evidence for inflammatory changes.  In the ED patient received IV Zosyn .  Plan to continue IV antibiotic if patient does not develop any fever and CBC does not show any evidence of leukocytosis further can DC antibiotic according to improvement of patient's clinical picture. -In the ED patient received 1 L of LR bolus continue maintenance fluid LR 75 cc/h. -Pending UA.   Non-insulin -dependent DM type II -Holding metformin  in setting of recent AKI and persistent nausea vomiting.  Continue sliding scale insulin  with mealtime coverage.  History of pulmonary embolism -Continue Eliquis  5 mg twice daily   Essential hypertension -Continue amlodipine .  Holding losartan  and chlorthalidone  in the setting of recent AKI and persistent nausea vomiting as high risk for development of AKI/worsening renal function. - IV hydralazine  as needed on board.  Generalized anxiety disorder - Continue BuSpar  5 mg 3 times daily.   DVT prophylaxis:  Eliquis  Code Status:  Full Code Diet: Heart healthy carb modified diet Family Communication:   Family was present at bedside, at the time of interview. Opportunity was given to ask question and all questions were answered  satisfactorily.  Disposition Plan: Continue monitor improvement of nausea vomiting.  If patient does not spike fever and no development of leukocytosis can discontinue IV antibiotic eventually. Consults: OB/GYN Admission status:   Inpatient, Telemetry bed  Severity of Illness: The appropriate patient status for this patient is INPATIENT. Inpatient  status is judged to be reasonable and necessary in order to provide the required intensity of service to ensure the patient's safety. The patient's presenting symptoms, physical exam findings, and initial radiographic and laboratory data in the context of their chronic comorbidities is felt to place them at high risk for further clinical deterioration. Furthermore, it is not anticipated that the patient will be medically stable for discharge from the hospital within 2 midnights of admission.   * I certify that at the point of admission it is my clinical judgment that the patient will require inpatient hospital care spanning beyond 2 midnights from the point of admission due to high intensity of service, high risk for further deterioration and high frequency of surveillance required.DEWAINE    Kieren Ricci, MD [facility]  How to contact the [facility] Attending or Consulting provider 7A - 7P or covering provider during after hours 7P -7A, for this patient.  Check the care team in [facility] and look for a) attending/consulting [facility] provider listed and b) the [facility] team listed Log into [email or web address] and use [facility] universal password to access. If you do not have the password, please contact the hospital operator. Locate the [facility] provider you are looking for under [facility] and page to a number that you can be directly reached. If you still have difficulty reaching the provider, please page the [facility] (Director on Call) for the Hospitalists listed on amion for assistance.  01/14/2024, 1:13 AM

## 2024-01-14 NOTE — ED Notes (Signed)
 Vital signs stable.

## 2024-01-14 NOTE — ED Notes (Signed)
 Called and placed patient on monitor with CCMD

## 2024-01-15 LAB — COMPREHENSIVE METABOLIC PANEL WITH GFR
ALT: 17 U/L (ref 0–44)
AST: 18 U/L (ref 15–41)
Albumin: 3.5 g/dL (ref 3.5–5.0)
Alkaline Phosphatase: 54 U/L (ref 38–126)
Anion gap: 14 (ref 5–15)
BUN: 9 mg/dL (ref 6–20)
CO2: 26 mmol/L (ref 22–32)
Calcium: 9.3 mg/dL (ref 8.9–10.3)
Chloride: 101 mmol/L (ref 98–111)
Creatinine, Ser: 1.1 mg/dL — ABNORMAL HIGH (ref 0.44–1.00)
GFR, Estimated: >60 mL/min (ref >60)
Glucose, Bld: 135 mg/dL — ABNORMAL HIGH (ref 70–99)
Potassium: 2.6 mmol/L — CL (ref 3.5–5.1)
Sodium: 141 mmol/L (ref 135–145)
Total Bilirubin: 0.8 mg/dL (ref 0.0–1.2)
Total Protein: 7.2 g/dL (ref 6.5–8.1)

## 2024-01-15 LAB — CBC
HCT: 35.3 % — ABNORMAL LOW (ref 36.0–46.0)
Hemoglobin: 11.4 g/dL — ABNORMAL LOW (ref 12.0–15.0)
MCH: 30.5 pg (ref 26.0–34.0)
MCHC: 32.3 g/dL (ref 30.0–36.0)
MCV: 94.4 fL (ref 80.0–100.0)
Platelets: 360 K/uL (ref 150–400)
RBC: 3.74 MIL/uL — ABNORMAL LOW (ref 3.87–5.11)
RDW: 13.3 % (ref 11.5–15.5)
WBC: 7.5 K/uL (ref 4.0–10.5)
nRBC: 0 % (ref 0.0–0.2)

## 2024-01-15 LAB — GLUCOSE, CAPILLARY
Glucose-Capillary: 128 mg/dL — ABNORMAL HIGH (ref 70–99)
Glucose-Capillary: 108 mg/dL — ABNORMAL HIGH (ref 70–99)
Glucose-Capillary: 126 mg/dL — ABNORMAL HIGH (ref 70–99)
Glucose-Capillary: 140 mg/dL — ABNORMAL HIGH (ref 70–99)

## 2024-01-15 MED ORDER — CHLORTHALIDONE 25 MG PO TABS
25.0000 mg | ORAL_TABLET | Freq: Every day | ORAL | Status: DC
  Administered 2024-01-16 – 2024-01-17 (×2): 25 mg via ORAL
  Filled 2024-01-15 (×2): qty 1

## 2024-01-15 MED ORDER — PROCHLORPERAZINE EDISYLATE 10 MG/2ML IJ SOLN
10.0000 mg | Freq: Four times a day (QID) | INTRAMUSCULAR | Status: DC | PRN
  Administered 2024-01-15 – 2024-01-19 (×6): 10 mg via INTRAVENOUS
  Filled 2024-01-15 (×6): qty 2

## 2024-01-15 MED ORDER — POTASSIUM CHLORIDE 20 MEQ PO PACK
40.0000 meq | PACK | Freq: Once | ORAL | Status: DC
Start: 2024-01-15 — End: 2024-01-16
  Filled 2024-01-15: qty 2

## 2024-01-15 MED ORDER — PROCHLORPERAZINE EDISYLATE 10 MG/2ML IJ SOLN
10.0000 mg | Freq: Once | INTRAMUSCULAR | Status: AC
  Administered 2024-01-15: 10 mg via INTRAVENOUS
  Filled 2024-01-15: qty 2

## 2024-01-15 MED ORDER — LOSARTAN POTASSIUM 50 MG PO TABS
50.0000 mg | ORAL_TABLET | Freq: Every day | ORAL | Status: DC
  Administered 2024-01-16 – 2024-01-21 (×6): 50 mg via ORAL
  Filled 2024-01-15 (×6): qty 1

## 2024-01-15 MED ORDER — KETOROLAC TROMETHAMINE 15 MG/ML IJ SOLN
15.0000 mg | Freq: Three times a day (TID) | INTRAMUSCULAR | Status: AC | PRN
  Administered 2024-01-16 – 2024-01-19 (×2): 15 mg via INTRAVENOUS
  Filled 2024-01-15 (×3): qty 1

## 2024-01-15 MED ORDER — POTASSIUM CHLORIDE 10 MEQ/100ML IV SOLN
10.0000 meq | INTRAVENOUS | Status: AC
  Administered 2024-01-15 (×5): 10 meq via INTRAVENOUS
  Filled 2024-01-15 (×5): qty 100

## 2024-01-15 NOTE — Progress Notes (Signed)
  Progress Note   Patient: Misty Walsh FMW:[id number] DOB: February 02, 1991 DOA: 01/13/2024     1 DOS: the patient was seen and examined on 01/15/2024        Brief hospital course: 33 y.o.  F with MO, DM, PE on Eliquis , HTN, HLD and recent hysterectomy complicated by tubo-ovarian abscess, who returns with nausea and vomiting.  CT in the ER shows resolving inflammatory and post-operative changes.  WBC normal.     Assessment and Plan: Intractable nausea and vomiting Patient's white count is normal.  Her CT abdomen and pelvis shows diminution in size of the fluid collections, no signs of active infection/abscess, no new fluid collections.  During her last 2 hospitalizations, cultures from her intra-abdominal abscesses on 10/21, 10/29, and 11/13 x 2 were all negative.  EGD during last admission was unremarkable.   -Advance diet as tolerated - Antiemetics - Hold sertraline   Hypokalemia - Supplement K - Check Mag  Morbid obesity BMI 48, complicates care  Diabetes Glucose normal - Continue sliding scale corrections - Hold metformin   History of venous thromboembolism -Continue home Eliquis   Hypertension Hypertensive urgency No evidence of endorgan damage - Continue home amlodipine  - Resume home chlorthalidone  and losartan  - As needed hydralazine  or labetalol   Hyperlipidemia -Hold Lipitor   Anxiety - Continue BuSpar  - Hold sertraline         Subjective: No clinical change.  She is spitting all night.  She has right upper quadrant pain when she takes a deep breath or vomits.  She has pain in her right buttock from where her previous drain was.  She has nausea.     Physical Exam: BP (!) 178/101 (BP Location: Left Wrist)   Pulse 96   Temp 97.7 F (36.5 C)   Resp 18   Ht 5' 4 (1.626 m)   Wt 128.1 kg   LMP 11/18/2023 (Exact Date)   SpO2 97%   BMI 48.48 kg/m   Obese adult female, lying in bed, sluggish, flat affect, psychomotor slowing noted, this is described  similarly during her last admission RRR, no murmurs, no peripheral edema Respiratory rate normal, lungs clear without rales or wheezes Abdomen soft, no significant tenderness or guarding throughout, exam severely limited by habitus Makes eye contact, responds to questions, oriented x 3, upper extremity strength appears weak but symmetric, face symmetric, speech fluent, severe psychomotor slowing and flat affect   Data Reviewed: Basic metabolic panel shows hypokalemia Renal function stable CBC shows no leukocytosis or anemia    Family Communication:     Disposition: Status is: Inpatient         Author: Lonni SHAUNNA Dalton, MD 01/15/2024 8:49 AM  For on call review [email or web address].

## 2024-01-15 NOTE — Plan of Care (Signed)
  Problem: Nutritional: Goal: Maintenance of adequate nutrition will improve Outcome: Not Progressing   Problem: Nutrition: Goal: Adequate nutrition will be maintained Outcome: Not Progressing   Problem: Activity: Goal: Risk for activity intolerance will decrease Outcome: Progressing   Problem: Pain Managment: Goal: General experience of comfort will improve and/or be controlled Outcome: Progressing

## 2024-01-16 LAB — GLUCOSE, CAPILLARY
Glucose-Capillary: 115 mg/dL — ABNORMAL HIGH (ref 70–99)
Glucose-Capillary: 145 mg/dL — ABNORMAL HIGH (ref 70–99)
Glucose-Capillary: 176 mg/dL — ABNORMAL HIGH (ref 70–99)
Glucose-Capillary: 119 mg/dL — ABNORMAL HIGH (ref 70–99)

## 2024-01-16 LAB — COMPREHENSIVE METABOLIC PANEL WITH GFR
ALT: 16 U/L (ref 0–44)
AST: 18 U/L (ref 15–41)
Albumin: 3.7 g/dL (ref 3.5–5.0)
Alkaline Phosphatase: 51 U/L (ref 38–126)
Anion gap: 15 (ref 5–15)
BUN: 9 mg/dL (ref 6–20)
CO2: 26 mmol/L (ref 22–32)
Calcium: 9.5 mg/dL (ref 8.9–10.3)
Chloride: 99 mmol/L (ref 98–111)
Creatinine, Ser: 1.14 mg/dL — ABNORMAL HIGH (ref 0.44–1.00)
GFR, Estimated: >60 mL/min (ref >60)
Glucose, Bld: 123 mg/dL — ABNORMAL HIGH (ref 70–99)
Potassium: 2.9 mmol/L — ABNORMAL LOW (ref 3.5–5.1)
Sodium: 140 mmol/L (ref 135–145)
Total Bilirubin: 0.9 mg/dL (ref 0.0–1.2)
Total Protein: 7.4 g/dL (ref 6.5–8.1)

## 2024-01-16 LAB — CBC
HCT: 34.9 % — ABNORMAL LOW (ref 36.0–46.0)
Hemoglobin: 11.2 g/dL — ABNORMAL LOW (ref 12.0–15.0)
MCH: 30.5 pg (ref 26.0–34.0)
MCHC: 32.1 g/dL (ref 30.0–36.0)
MCV: 95.1 fL (ref 80.0–100.0)
Platelets: 352 K/uL (ref 150–400)
RBC: 3.67 MIL/uL — ABNORMAL LOW (ref 3.87–5.11)
RDW: 13.4 % (ref 11.5–15.5)
WBC: 6 K/uL (ref 4.0–10.5)
nRBC: 0 % (ref 0.0–0.2)

## 2024-01-16 LAB — MAGNESIUM: Magnesium: 1.8 mg/dL (ref 1.7–2.4)

## 2024-01-16 MED ORDER — POTASSIUM CHLORIDE 10 MEQ/100ML IV SOLN
10.0000 meq | INTRAVENOUS | Status: AC
Start: 2024-01-16 — End: 2024-01-16
  Administered 2024-01-16 (×5): 10 meq via INTRAVENOUS
  Filled 2024-01-16 (×5): qty 100

## 2024-01-16 NOTE — Progress Notes (Signed)
 At bedside for PIV insertion. Assessed bilateral arms with and without U/S guidance with no suitable vessels identified for PIV or midline. Brachial vessel on left side >2cm in depth. No cephalic vessels noted. Pt currently has 1 functioning PIV. RN made aware.

## 2024-01-16 NOTE — Plan of Care (Signed)
  Problem: Coping: Goal: Ability to adjust to condition or change in health will improve Outcome: Progressing   Problem: Health Behavior/Discharge Planning: Goal: Ability to manage health-related needs will improve Outcome: Progressing   Problem: Nutritional: Goal: Maintenance of adequate nutrition will improve Outcome: Progressing

## 2024-01-16 NOTE — Progress Notes (Signed)
  Progress Note   Patient: Misty Walsh FMW:[id number] DOB: April 17, 1990 DOA: 01/13/2024     2 DOS: the patient was seen and examined on 01/16/2024        Brief hospital course: 33 y.o.  F with MO, DM, PE on Eliquis , HTN, HLD and recent hysterectomy complicated by tubo-ovarian abscess, who returns with nausea and vomiting.  CT in the ER shows resolving inflammatory and post-operative changes.  WBC normal.     Assessment and Plan: Intractable nausea and vomiting Patient's white count is normal.  Her CT abdomen and pelvis shows diminution in size of the fluid collections, no signs of active infection/abscess, no new fluid collections.  During her last 2 hospitalizations, cultures from her intra-abdominal abscesses on 10/21, 10/29, and 11/13 x 2 were all negative.  EGD during last admission was unremarkable.    Able to take her pills this morning - Advance diet as tolerated - Continue antiemetics - Hold sertraline  - Start scheduled Reglan    Hypokalemia Mag normal - Repeat potassium supplementation   Morbid obesity BMI 48, complicates care   Diabetes Glucose controlled - Hold metformin  - Continue sliding scale corrections   History of venous thromboembolism Has been refusing Eliquis  this week - Continue Eliquis  as tolerated   Hypertension Hypertensive urgency Blood pressure improved today, no evidence of endorgan damage - Continue home amlodipine , chlorthalidone , and losartan  as tolerated - As needed hydralazine  and labetalol    Hyperlipidemia - Hold Lipitor    Anxiety - Hold sertraline  - Continue BuSpar        Subjective: Able to take pills.  No fever, no increase in pain. No vomiting yet this morning.  Still having excessive saliva and frequent spitting.       Physical Exam: BP (!) 150/82 (BP Location: Right Arm)   Pulse 88   Temp 98.2 F (36.8 C)   Resp 16   Ht 5' 4 (1.626 m)   Wt 128.1 kg   LMP 11/18/2023 (Exact Date)   SpO2 96%   BMI 48.48  kg/m   Obese adult female, lying in bed, interactive and appropriate RRR, no murmurs, no peripheral edema Respiratory rate normal, lungs clear without rales or wheezes Abdomen soft, she is apprehensive about exam, but has no grimace to palpation, no guarding Affect flat, judgment and insight appear unchanged, oriented x 3, upper extremity strength and coordination seem normal     Data Reviewed: Basic metabolic panel shows persistent hypokalemia, renal function unchanged, BUN 9 Magnesium  normal CBC shows mild anemia, no leukocytosis     Family Communication:     Disposition: Status is: Inpatient         Author: Lonni SHAUNNA Dalton, MD 01/16/2024 9:43 AM  For on call review [email or web address]

## 2024-01-17 LAB — CBC
HCT: 34.7 % — ABNORMAL LOW (ref 36.0–46.0)
Hemoglobin: 11.2 g/dL — ABNORMAL LOW (ref 12.0–15.0)
MCH: 30.7 pg (ref 26.0–34.0)
MCHC: 32.3 g/dL (ref 30.0–36.0)
MCV: 95.1 fL (ref 80.0–100.0)
Platelets: 323 K/uL (ref 150–400)
RBC: 3.65 MIL/uL — ABNORMAL LOW (ref 3.87–5.11)
RDW: 13.3 % (ref 11.5–15.5)
WBC: 6.4 K/uL (ref 4.0–10.5)
nRBC: 0 % (ref 0.0–0.2)

## 2024-01-17 LAB — COMPREHENSIVE METABOLIC PANEL WITH GFR
ALT: 15 U/L (ref 0–44)
AST: 18 U/L (ref 15–41)
Albumin: 3.6 g/dL (ref 3.5–5.0)
Alkaline Phosphatase: 49 U/L (ref 38–126)
Anion gap: 12 (ref 5–15)
BUN: 7 mg/dL (ref 6–20)
CO2: 27 mmol/L (ref 22–32)
Calcium: 9.4 mg/dL (ref 8.9–10.3)
Chloride: 98 mmol/L (ref 98–111)
Creatinine, Ser: 1.01 mg/dL — ABNORMAL HIGH (ref 0.44–1.00)
GFR, Estimated: >60 mL/min (ref >60)
Glucose, Bld: 108 mg/dL — ABNORMAL HIGH (ref 70–99)
Potassium: 2.8 mmol/L — ABNORMAL LOW (ref 3.5–5.1)
Sodium: 137 mmol/L (ref 135–145)
Total Bilirubin: 0.8 mg/dL (ref 0.0–1.2)
Total Protein: 7.1 g/dL (ref 6.5–8.1)

## 2024-01-17 LAB — GLUCOSE, CAPILLARY
Glucose-Capillary: 104 mg/dL — ABNORMAL HIGH (ref 70–99)
Glucose-Capillary: 133 mg/dL — ABNORMAL HIGH (ref 70–99)
Glucose-Capillary: 124 mg/dL — ABNORMAL HIGH (ref 70–99)
Glucose-Capillary: 156 mg/dL — ABNORMAL HIGH (ref 70–99)
Glucose-Capillary: 105 mg/dL — ABNORMAL HIGH (ref 70–99)

## 2024-01-17 MED ORDER — POTASSIUM CHLORIDE 10 MEQ/100ML IV SOLN
10.0000 meq | INTRAVENOUS | Status: AC
  Administered 2024-01-17 (×5): 10 meq via INTRAVENOUS
  Filled 2024-01-17: qty 100

## 2024-01-17 MED ORDER — FAMOTIDINE 20 MG PO TABS
20.0000 mg | ORAL_TABLET | Freq: Two times a day (BID) | ORAL | Status: DC | PRN
  Administered 2024-01-18 – 2024-01-19 (×2): 20 mg via ORAL
  Filled 2024-01-17 (×2): qty 1

## 2024-01-17 MED ORDER — METOCLOPRAMIDE HCL 5 MG/ML IJ SOLN
5.0000 mg | Freq: Three times a day (TID) | INTRAMUSCULAR | Status: DC
  Administered 2024-01-17 – 2024-01-21 (×11): 5 mg via INTRAVENOUS
  Filled 2024-01-17 (×11): qty 2

## 2024-01-17 MED ORDER — FAMOTIDINE IN NACL 20-0.9 MG/50ML-% IV SOLN
20.0000 mg | Freq: Once | INTRAVENOUS | Status: AC
  Administered 2024-01-17: 20 mg via INTRAVENOUS
  Filled 2024-01-17: qty 50

## 2024-01-17 NOTE — Plan of Care (Signed)
  Problem: Coping: Goal: Ability to adjust to condition or change in health will improve Outcome: Not Progressing   Problem: Fluid Volume: Goal: Ability to maintain a balanced intake and output will improve Outcome: Progressing   Problem: Health Behavior/Discharge Planning: Goal: Ability to identify and utilize available resources and services will improve Outcome: Progressing Goal: Ability to manage health-related needs will improve Outcome: Progressing   Problem: Metabolic: Goal: Ability to maintain appropriate glucose levels will improve Outcome: Progressing   Problem: Skin Integrity: Goal: Risk for impaired skin integrity will decrease Outcome: Progressing   Problem: Tissue Perfusion: Goal: Adequacy of tissue perfusion will improve Outcome: Progressing   Problem: Education: Goal: Knowledge of General Education information will improve Description: Including pain rating scale, medication(s)/side effects and non-pharmacologic comfort measures Outcome: Progressing   Problem: Health Behavior/Discharge Planning: Goal: Ability to manage health-related needs will improve Outcome: Progressing   Problem: Clinical Measurements: Goal: Ability to maintain clinical measurements within normal limits will improve Outcome: Progressing Goal: Will remain free from infection Outcome: Progressing Goal: Diagnostic test results will improve Outcome: Progressing Goal: Respiratory complications will improve Outcome: Progressing Goal: Cardiovascular complication will be avoided Outcome: Progressing   Problem: Nutrition: Goal: Adequate nutrition will be maintained Outcome: Progressing   Problem: Elimination: Goal: Will not experience complications related to bowel motility Outcome: Progressing Goal: Will not experience complications related to urinary retention Outcome: Progressing   Problem: Pain Managment: Goal: General experience of comfort will improve and/or be  controlled Outcome: Progressing   Problem: Safety: Goal: Ability to remain free from injury will improve Outcome: Progressing   Problem: Skin Integrity: Goal: Risk for impaired skin integrity will decrease Outcome: Progressing

## 2024-01-17 NOTE — Progress Notes (Signed)
  Progress Note   Patient: Misty Walsh FMW:[id number] DOB: 1990-04-22 DOA: 01/13/2024     3 DOS: the patient was seen and examined on 01/17/2024        Brief hospital course: 33 y.o.  F with MO, DM, PE on Eliquis , HTN, HLD and recent hysterectomy complicated by tubo-ovarian abscess, who returns with nausea and vomiting.  CT in the ER shows resolving inflammatory and post-operative changes.  WBC normal.     Assessment and Plan: Intractable nausea and vomiting See summary from 11/28. CT on admission showed small (1 to 2 cm) fluid collections in the right adnexal region, hemipelvis, with removal of the transgluteal drainage catheter, and approval of the rectus sheath fluid collection.  No new fluid collections, no acute change, no bowel obstruction, no clinical evidence of ileus.  Labs normal off IV fluids for last 48 horus Patient's primary symptom is spitting up saliva - Continue antiemetics - Continue scheduled Reglan  - Advance diet as tolerated - Hold sertraline  - Avoid opiates - Daily BMP      Flat affect Since admission, patient has had odd flat affect, psychomotor slowing.  Family comment on this today, has been present for several weeks, noted during admission last month.  Differential for this is psychological versus metabolic versus medication side effect. - Check ammonia, TSH  Hypokalemia Mag normal - Potassium supplement   Morbid obesity BMI 48, complicates care   Diabetes Glucose normal - Hold metformin  - Continue sliding scale   History of venous thromboembolism -Continue Eliquis  as tolerated, able to swallow them today   Hypertension Hypertensive urgency Blood pressure elevated - Continue amlodipine , chlorthalidone , losartan  as tolerated - Continue PRNs if unable to tolerate p.o.   Hyperlipidemia - Hold Lipitor    Anxiety - Continue BuSpar  - Hold sertraline              Subjective: Patient said no significant change, she is refusing  most oral intake, she has had no fever.  She still is spitting up frequently.     Physical Exam: BP (!) 166/103 (BP Location: Right Arm)   Pulse 93   Temp 98 F (36.7 C) (Oral)   Resp 16   Ht 5' 4 (1.626 m)   Wt 128.1 kg   LMP 11/18/2023 (Exact Date)   SpO2 97%   BMI 48.48 kg/m   Adult female, lying in bed, appears weak and tired, affect remains flat, psychomotor slowing is noted RRR, no murmurs, no peripheral edema Respiratory rate normal, lungs clear without rales or wheezes Abdomen without grimace to palpation or guarding, exam limited by habitus Face symmetric, speech fluent, upper extremity strength seems normal, she is flat, has psychomotor slowing, speech is slowed, oriented x 3     Data Reviewed: Basic metabolic panel shows hypokalemia, stable renal function, BUN 7 CBC shows mild anemia, no leukocytosis  Family Communication: Father, aunt, and brother at the bedside    Disposition: Status is: Inpatient         Author: Lonni SHAUNNA Dalton, MD 01/17/2024 10:48 AM  For on call review [email or web address].

## 2024-01-17 NOTE — Plan of Care (Signed)
   Problem: Education: Goal: Knowledge of General Education information will improve Description: Including pain rating scale, medication(s)/side effects and non-pharmacologic comfort measures Outcome: Progressing   Problem: Clinical Measurements: Goal: Ability to maintain clinical measurements within normal limits will improve Outcome: Progressing   Problem: Nutrition: Goal: Adequate nutrition will be maintained Outcome: Progressing

## 2024-01-18 LAB — COMPREHENSIVE METABOLIC PANEL WITH GFR
ALT: 17 U/L (ref 0–44)
AST: 20 U/L (ref 15–41)
Albumin: 3.4 g/dL — ABNORMAL LOW (ref 3.5–5.0)
Alkaline Phosphatase: 51 U/L (ref 38–126)
Anion gap: 10 (ref 5–15)
BUN: <5 mg/dL — ABNORMAL LOW (ref 6–20)
CO2: 26 mmol/L (ref 22–32)
Calcium: 9.3 mg/dL (ref 8.9–10.3)
Chloride: 97 mmol/L — ABNORMAL LOW (ref 98–111)
Creatinine, Ser: 0.94 mg/dL (ref 0.44–1.00)
GFR, Estimated: >60 mL/min (ref >60)
Glucose, Bld: 115 mg/dL — ABNORMAL HIGH (ref 70–99)
Potassium: 2.8 mmol/L — ABNORMAL LOW (ref 3.5–5.1)
Sodium: 133 mmol/L — ABNORMAL LOW (ref 135–145)
Total Bilirubin: 0.8 mg/dL (ref 0.0–1.2)
Total Protein: 6.7 g/dL (ref 6.5–8.1)

## 2024-01-18 LAB — CBC
HCT: 34.2 % — ABNORMAL LOW (ref 36.0–46.0)
Hemoglobin: 11.3 g/dL — ABNORMAL LOW (ref 12.0–15.0)
MCH: 31 pg (ref 26.0–34.0)
MCHC: 33 g/dL (ref 30.0–36.0)
MCV: 93.7 fL (ref 80.0–100.0)
Platelets: 301 K/uL (ref 150–400)
RBC: 3.65 MIL/uL — ABNORMAL LOW (ref 3.87–5.11)
RDW: 13 % (ref 11.5–15.5)
WBC: 6.4 K/uL (ref 4.0–10.5)
nRBC: 0 % (ref 0.0–0.2)

## 2024-01-18 LAB — GLUCOSE, CAPILLARY
Glucose-Capillary: 119 mg/dL — ABNORMAL HIGH (ref 70–99)
Glucose-Capillary: 167 mg/dL — ABNORMAL HIGH (ref 70–99)
Glucose-Capillary: 103 mg/dL — ABNORMAL HIGH (ref 70–99)
Glucose-Capillary: 112 mg/dL — ABNORMAL HIGH (ref 70–99)

## 2024-01-18 LAB — AMMONIA: Ammonia: <13 umol/L (ref 9–35)

## 2024-01-18 LAB — TSH: TSH: 3.649 u[IU]/mL (ref 0.350–4.500)

## 2024-01-18 MED ORDER — ONDANSETRON HCL 4 MG/2ML IJ SOLN
4.0000 mg | Freq: Four times a day (QID) | INTRAMUSCULAR | Status: DC | PRN
  Administered 2024-01-18 – 2024-01-19 (×2): 4 mg via INTRAVENOUS
  Filled 2024-01-18 (×2): qty 2

## 2024-01-18 MED ORDER — POTASSIUM CHLORIDE 10 MEQ/100ML IV SOLN
10.0000 meq | INTRAVENOUS | Status: AC
  Administered 2024-01-18 (×3): 10 meq via INTRAVENOUS
  Filled 2024-01-18 (×3): qty 100

## 2024-01-18 MED ORDER — ORAL CARE MOUTH RINSE: 15.0000 mL | OROMUCOSAL | Status: DC | PRN

## 2024-01-18 MED ORDER — POTASSIUM CHLORIDE 20 MEQ PO PACK: 40.0000 meq | PACK | Freq: Once | ORAL | Status: DC

## 2024-01-18 MED ORDER — GLYCOPYRROLATE 1 MG PO TABS
1.0000 mg | ORAL_TABLET | Freq: Once | ORAL | Status: AC
  Administered 2024-01-18: 1 mg via ORAL
  Filled 2024-01-18: qty 1

## 2024-01-18 MED ORDER — POTASSIUM CHLORIDE 10 MEQ/100ML IV SOLN
10.0000 meq | INTRAVENOUS | Status: AC
  Administered 2024-01-18 (×3): 10 meq via INTRAVENOUS
  Filled 2024-01-18 (×2): qty 100

## 2024-01-18 NOTE — Plan of Care (Signed)
  Problem: Education: Goal: Ability to describe self-care measures that may prevent or decrease complications (Diabetes Survival Skills Education) will improve Outcome: Progressing   Problem: Coping: Goal: Ability to adjust to condition or change in health will improve Outcome: Progressing   Problem: Education: Goal: Knowledge of General Education information will improve Description: Including pain rating scale, medication(s)/side effects and non-pharmacologic comfort measures Outcome: Progressing   

## 2024-01-18 NOTE — Progress Notes (Signed)
  Progress Note   Patient: Misty Walsh FMW:[id number] DOB: Dec 14, 1990 DOA: 01/13/2024     4 DOS: the patient was seen and examined on 01/18/2024        Brief hospital course: 33 y.o.  F with MO, DM, PE on Eliquis , HTN, HLD and recent hysterectomy complicated by tubo-ovarian abscess, who returns with nausea and vomiting.  CT in the ER shows resolving inflammatory and post-operative changes.  WBC normal.     Assessment and Plan: Intractable nausea and vomiting See summary 11/28 and 11/29 Somewhat better yesterday after starting scheduled Reglan  Still not able to tolerate any solid food - Continue scheduled Reglan  - Continue as needed antiemetics - ADHD - Hold sertraline  - Avoid opiates - Daily BMP        Flat affect See summary from 11/29 Ammonia and TSH normal - Outpatient psychiatry follow-up    Hypokalemia -Continue potassium supplement -Hold chlorthalidone   Morbid obesity BMI 48, complicates care   Diabetes Glucose controlled - Continue status quo corrections - Hold metformin    History of venous thromboembolism - Continue Eliquis    Hypertension Hypertensive urgency Blood pressure overall normal - Continue amlodipine , losartan  - Hold chlorthalidone  due to hypokalemia - Continue as needed blood pressure medicines   Hyperlipidemia - Hold Lipitor  for now   Anxiety -Continue BuSpar  - Hold sertraline              Subjective: Yesterday afternoon, she had an okay day, drank 1 L of apple juice, but no solid food, seemed to have improvement in symptoms overall, then this morning at 4 AM, vomited multiple times.  No new abdominal pain, no fever, no confusion.     Physical Exam: BP (!) 168/100 (BP Location: Right Arm)   Pulse 85   Temp 98.7 F (37.1 C) (Oral)   Resp 18   Ht 5' 4 (1.626 m)   Wt 128.1 kg   LMP 11/18/2023 (Exact Date)   SpO2 99%   BMI 48.48 kg/m   Adult female, lying in bed, interactive and appropriate RRR, no murmurs,  no peripheral edema Respiratory rate normal, lungs clear without rales or wheezes Abdomen soft, no tenderness palpation or guarding, no ascites or distention Face symmetric, speech fluent Very flat affect, generalized psychomotor slowing    Data Reviewed: Basic metabolic panel shows hyponatremia, hypokalemia Renal function normal CBC shows mild anemia  Family Communication:     Disposition: Status is: Inpatient         Author: Lonni SHAUNNA Dalton, MD 01/18/2024 11:44 AM  For on call review [email or web address]

## 2024-01-19 LAB — COMPREHENSIVE METABOLIC PANEL WITH GFR
ALT: 23 U/L (ref 0–44)
AST: 28 U/L (ref 15–41)
Albumin: 3.5 g/dL (ref 3.5–5.0)
Alkaline Phosphatase: 56 U/L (ref 38–126)
Anion gap: 8 (ref 5–15)
BUN: <5 mg/dL — ABNORMAL LOW (ref 6–20)
CO2: 30 mmol/L (ref 22–32)
Calcium: 9.5 mg/dL (ref 8.9–10.3)
Chloride: 97 mmol/L — ABNORMAL LOW (ref 98–111)
Creatinine, Ser: 1 mg/dL (ref 0.44–1.00)
GFR, Estimated: >60 mL/min (ref >60)
Glucose, Bld: 133 mg/dL — ABNORMAL HIGH (ref 70–99)
Potassium: 2.8 mmol/L — ABNORMAL LOW (ref 3.5–5.1)
Sodium: 135 mmol/L (ref 135–145)
Total Bilirubin: 0.6 mg/dL (ref 0.0–1.2)
Total Protein: 7 g/dL (ref 6.5–8.1)

## 2024-01-19 LAB — CBC
HCT: 36.1 % (ref 36.0–46.0)
Hemoglobin: 12 g/dL (ref 12.0–15.0)
MCH: 30.8 pg (ref 26.0–34.0)
MCHC: 33.2 g/dL (ref 30.0–36.0)
MCV: 92.8 fL (ref 80.0–100.0)
Platelets: 267 K/uL (ref 150–400)
RBC: 3.89 MIL/uL (ref 3.87–5.11)
RDW: 13 % (ref 11.5–15.5)
WBC: 5.2 K/uL (ref 4.0–10.5)
nRBC: 0 % (ref 0.0–0.2)

## 2024-01-19 LAB — GLUCOSE, CAPILLARY
Glucose-Capillary: 115 mg/dL — ABNORMAL HIGH (ref 70–99)
Glucose-Capillary: 168 mg/dL — ABNORMAL HIGH (ref 70–99)
Glucose-Capillary: 114 mg/dL — ABNORMAL HIGH (ref 70–99)
Glucose-Capillary: 123 mg/dL — ABNORMAL HIGH (ref 70–99)

## 2024-01-19 MED ORDER — POTASSIUM CHLORIDE 10 MEQ/100ML IV SOLN
10.0000 meq | INTRAVENOUS | Status: AC
  Administered 2024-01-19 (×2): 10 meq via INTRAVENOUS

## 2024-01-19 MED ORDER — FAMOTIDINE 20 MG PO TABS
20.0000 mg | ORAL_TABLET | Freq: Two times a day (BID) | ORAL | Status: DC | PRN
  Administered 2024-01-20: 20 mg via ORAL
  Filled 2024-01-19: qty 1

## 2024-01-19 MED ORDER — POTASSIUM CHLORIDE 10 MEQ/100ML IV SOLN
10.0000 meq | INTRAVENOUS | Status: DC
  Administered 2024-01-19 (×4): 10 meq via INTRAVENOUS
  Filled 2024-01-19 (×5): qty 100

## 2024-01-19 MED ORDER — FAMOTIDINE IN NACL 20-0.9 MG/50ML-% IV SOLN
20.0000 mg | Freq: Once | INTRAVENOUS | Status: AC
  Administered 2024-01-19: 20 mg via INTRAVENOUS
  Filled 2024-01-19: qty 50

## 2024-01-19 MED ORDER — POTASSIUM CHLORIDE 20 MEQ PO PACK
40.0000 meq | PACK | Freq: Once | ORAL | Status: AC
  Administered 2024-01-19: 40 meq via ORAL
  Filled 2024-01-19: qty 2

## 2024-01-19 MED ORDER — POTASSIUM CHLORIDE CRYS ER 10 MEQ PO TBCR
10.0000 meq | EXTENDED_RELEASE_TABLET | Freq: Once | ORAL | Status: DC
  Filled 2024-01-19: qty 1

## 2024-01-19 MED ORDER — GLYCOPYRROLATE 1 MG PO TABS
1.0000 mg | ORAL_TABLET | Freq: Three times a day (TID) | ORAL | Status: DC
  Administered 2024-01-19 – 2024-01-21 (×7): 1 mg via ORAL
  Filled 2024-01-19 (×9): qty 1

## 2024-01-19 NOTE — Plan of Care (Signed)
 Patient had one vomiting episode overnight. Some blood streaked secretions were observed in the basin. Dressing was changed on surgical site with xeroform and foam dressing.  Problem: Education: Goal: Knowledge of General Education information will improve Description: Including pain rating scale, medication(s)/side effects and non-pharmacologic comfort measures Outcome: Progressing   Problem: Health Behavior/Discharge Planning: Goal: Ability to manage health-related needs will improve Outcome: Progressing   Problem: Activity: Goal: Risk for activity intolerance will decrease Outcome: Progressing   Problem: Coping: Goal: Level of anxiety will decrease Outcome: Progressing   Problem: Safety: Goal: Ability to remain free from injury will improve Outcome: Progressing

## 2024-01-19 NOTE — Progress Notes (Signed)
  Progress Note   Patient: Misty Walsh FMW:[id number] DOB: May 31, 1990 DOA: 01/13/2024     5 DOS: the patient was seen and examined on 01/19/2024        Brief hospital course: 33 y.o.  F with MO, DM, PE on Eliquis , HTN, HLD and recent hysterectomy complicated by tubo-ovarian abscess, who returns with nausea and vomiting.  CT in the ER shows resolving inflammatory and post-operative changes.  WBC normal.      Assessment and Plan: Intractable nausea and vomiting See summary 11/28 and 11/29 Improving today, able to tolerate PO - Continue scheduled Reglan  - Continue as needed antiemetics - ADAT - Avoid opiates - Daily BMP        Flat affect See summary from 11/29 Ammonia and TSH normal - Outpatient psychiatry follow-up     Hypokalemia Severely low - Continue K aggressively  -Hold chlorthalidone    Diabetes Glucose controlled - Continue SS corrections - Hold metformin    History of venous thromboembolism - Continue apixaban    Hypertension Hypertensive urgency BP controlled - Continue amlodipine , losartan  - Hold chlorthalidone  due to hypokalemia - Continue as needed blood pressure medicines   Hyperlipidemia - Hold Lipitor  for now   Anxiety -Continue BuSpar  - Hold sertraline            Subjective: No fever.  Tolerating cereal this morning.  No fever, no abdominal pain.  2 BMs in last 3 days.     Physical Exam: BP (!) 140/83 (BP Location: Left Arm)   Pulse (!) 101   Temp 98.5 F (36.9 C)   Resp 16   Ht 5' 4 (1.626 m)   Wt 128.1 kg   LMP 11/18/2023 (Exact Date)   SpO2 98%   BMI 48.48 kg/m   General: Pt is awake, responsive to questions.   No acute distress Cardiovascular: RRR, nl S1-S2, no murmurs appreciated.   No LE edema.   Respiratory: Normal respiratory rate and rhythm.  CTAB without rales or wheezes. Abdominal: Abdomen soft and non-tender.  No distension or HSM.   Neuro/Psych: Strength symmetric in upper and lower extremities.   Judgment and insight appear normal, flat affect.   Data Reviewed: BMP shows severe hypokalemia CbC stable     Family Communication: fFather by phone    Disposition: Status is: Inpatient         Author: Lonni SHAUNNA Dalton, MD 01/19/2024 5:17 PM  For on call review [email or web address].

## 2024-01-19 NOTE — Progress Notes (Signed)
       Overnight   NAME: Misty Walsh MRN: [id number] DOB : 10-May-1990    Date of Service   01/19/2024   HPI/Events of Note   HPI: 33 year old female with past medical history of MO, DM M, PE on Eliquis , hypertension, hyperlipidemia, recent hysterectomy complicated by tubo-ovarian abscess, who returns with nausea and vomiting.  CT in ER shows resolving inflammatory and postoperative changes.  WBC normal  Overnight: Notified of blood-tinged emesis at 2123.  Picture attached to media tab in chart.  RN has given Reglan  and Zofran  as needed for nausea.  One-time IV Pepcid  dose ordered.   Interventions/ Plan   20 Mg IV Pepcid  ordered for acid reduction.  Continue PRN nausea medications and scheduled nausea medications.        Update:  0000-RN reports patient is sleeping but wakes up intermittently to vomit.  RN reports emesis is mainly mucus/water that the patient had recently drank  Advised RN to encourage patient stick to ice chips until nausea has resolved.   Caliana Spires Donati- Aram BSN RN CCRN AGACNP-BC Acute Care Nurse Practitioner [facility]

## 2024-01-19 NOTE — Progress Notes (Signed)
 Patient vomited bloody vomitus, informed Lynwood Kipper NP, inquired if we need to hold eliquis , per NP Natalie Donati-Garmon, may give dose due tonight. Per Patient, she was not able to eat any fo her dinner, she can only tolerate drinking.

## 2024-01-20 LAB — COMPREHENSIVE METABOLIC PANEL WITH GFR
ALT: 20 U/L (ref 0–44)
AST: 20 U/L (ref 15–41)
Albumin: 3.2 g/dL — ABNORMAL LOW (ref 3.5–5.0)
Alkaline Phosphatase: 50 U/L (ref 38–126)
Anion gap: 9 (ref 5–15)
BUN: 6 mg/dL (ref 6–20)
CO2: 27 mmol/L (ref 22–32)
Calcium: 9.1 mg/dL (ref 8.9–10.3)
Chloride: 102 mmol/L (ref 98–111)
Creatinine, Ser: 0.94 mg/dL (ref 0.44–1.00)
GFR, Estimated: >60 mL/min (ref >60)
Glucose, Bld: 113 mg/dL — ABNORMAL HIGH (ref 70–99)
Potassium: 3.1 mmol/L — ABNORMAL LOW (ref 3.5–5.1)
Sodium: 138 mmol/L (ref 135–145)
Total Bilirubin: 0.5 mg/dL (ref 0.0–1.2)
Total Protein: 6.5 g/dL (ref 6.5–8.1)

## 2024-01-20 LAB — CBC
HCT: 32.8 % — ABNORMAL LOW (ref 36.0–46.0)
Hemoglobin: 10.9 g/dL — ABNORMAL LOW (ref 12.0–15.0)
MCH: 31 pg (ref 26.0–34.0)
MCHC: 33.2 g/dL (ref 30.0–36.0)
MCV: 93.2 fL (ref 80.0–100.0)
Platelets: 284 K/uL (ref 150–400)
RBC: 3.52 MIL/uL — ABNORMAL LOW (ref 3.87–5.11)
RDW: 13.2 % (ref 11.5–15.5)
WBC: 5.7 K/uL (ref 4.0–10.5)
nRBC: 0 % (ref 0.0–0.2)

## 2024-01-20 LAB — GLUCOSE, CAPILLARY
Glucose-Capillary: 107 mg/dL — ABNORMAL HIGH (ref 70–99)
Glucose-Capillary: 192 mg/dL — ABNORMAL HIGH (ref 70–99)
Glucose-Capillary: 134 mg/dL — ABNORMAL HIGH (ref 70–99)
Glucose-Capillary: 153 mg/dL — ABNORMAL HIGH (ref 70–99)

## 2024-01-20 LAB — MAGNESIUM: Magnesium: 1.6 mg/dL — ABNORMAL LOW (ref 1.7–2.4)

## 2024-01-20 MED ORDER — POTASSIUM CHLORIDE 20 MEQ PO PACK
40.0000 meq | PACK | Freq: Once | ORAL | Status: AC
  Administered 2024-01-20: 40 meq via ORAL
  Filled 2024-01-20: qty 2

## 2024-01-20 MED ORDER — MAGNESIUM SULFATE 2 GM/50ML IV SOLN
2.0000 g | Freq: Once | INTRAVENOUS | Status: AC
  Administered 2024-01-20: 2 g via INTRAVENOUS
  Filled 2024-01-20: qty 50

## 2024-01-20 MED ORDER — LOPERAMIDE HCL 2 MG PO CAPS
2.0000 mg | ORAL_CAPSULE | ORAL | Status: DC | PRN
  Administered 2024-01-20 (×2): 2 mg via ORAL
  Filled 2024-01-20 (×2): qty 1

## 2024-01-20 NOTE — Plan of Care (Signed)
  Problem: Fluid Volume: Goal: Ability to maintain a balanced intake and output will improve Outcome: Progressing   Problem: Metabolic: Goal: Ability to maintain appropriate glucose levels will improve Outcome: Progressing   Problem: Coping: Goal: Ability to adjust to condition or change in health will improve Outcome: Progressing   Problem: Clinical Measurements: Goal: Diagnostic test results will improve Outcome: Progressing   Problem: Elimination: Goal: Will not experience complications related to bowel motility Outcome: Progressing

## 2024-01-20 NOTE — Progress Notes (Signed)
 Progress Note   Patient: Misty Walsh FMW:[id number] DOB: Jun 26, 1990 DOA: 01/13/2024     6 DOS: the patient was seen and examined on 01/20/2024        Brief hospital course: 33 y.o.  F with MO, DM, PE on Eliquis , HTN, HLD and recent hysterectomy complicated by tubo-ovarian abscess, who returns with nausea and vomiting.  CT in the ER shows resolving inflammatory and post-operative changes.  WBC normal.      Assessment and Plan: Intractable nausea and vomiting Recent prolonged rehospitalization after surgery notable for normal labs, CT showing resolving fluid collections, fluid collections aspirated, cultures negative, ID consulted recommended against antibiotics. GI consulted, normal EGD other than small patch vomiting related erythema.  Labs remained stable off IV fluids, and ultimately requested to be discharged.  Returned with intractable vomiting, and spitting saliva similar to before.  Again labs unremarkable and imaging showed resolving fluid collections no new fluid collections or SBO.  Having BMs, not really an ileus.  ?Gastroparesis or other cyclic vomiting syndrome?  Tolerating liquids and improved to solid food today.  Ovenright one episode of small volume hematemesis. - Continue scheduled Reglan  - Continue antiemetics and PRN Robinul  for spitting at patient's request - Hold sertraline  (?GI side effect) - Overall seems to be improving on above regimen      Flat affect Since admission, patient had a flat affect, psychomotor slowing.  This was also noted during previous admission, and seen by psychiatry, who offered no diagnoses.  Ammonia and TSH normal.  Family report this has been present several weeks.  In last 48 hours, this is noticeably better, more cheerful and conversational today.   - Outpatient psychiatry follow-up     Hypokalemia Hypomagnesemia K slightly up despite aggressive aggressive supplementation. - Oral K -Hold chlorthalidone   - Supp  mag  Diabetes Glucose controlled - Continue SS corrections - Hold metformin    History of venous thromboembolism - Continue apixaban    Hypertension Hypertensive urgency BP controlled - Continue amlodipine , losartan  - Hold chlorthalidone  due to hypokalemia - Continue as needed blood pressure medicines   Hyperlipidemia - Hold Lipitor  for now   Anxiety -Continue BuSpar  - Hold sertraline            Subjective: No new fever.  Tolerated food last night.  Mild dysuria overnight.  Some diarrhea overnight.  One hematemesis.     Physical Exam: BP 135/83   Pulse 84   Temp (!) 97.5 F (36.4 C) (Oral)   Resp 16   Ht 5' 4 (1.626 m)   Wt 128.1 kg   LMP 11/18/2023 (Exact Date)   SpO2 97%   BMI 48.48 kg/m   General: Pt is awake, responsive to questions.   No acute distress Cardiovascular: RRR, nl S1-S2, no murmurs appreciated.   No LE edema.   Respiratory: Normal respiratory rate and rhythm.  CTAB without rales or wheezes. Abdominal: Abdomen soft and non-tender.  No distension or HSM.   Neuro/Psych: Strength symmetric in upper and lower extremities.  Judgment and insight appear normal, flat affect.   Data Reviewed: BMP shows normal BUN, Cr K up to 3.1, mag 1.6 Hgb down to 10/9     Family Communication: Father by phone    Disposition: Status is: Inpatient 33 yo F with intractable nausea vomiting.  Imaging labs and recent EGD unremarkable.  Suspect this is gastroparesis or something similar, maybe exacerbated by recent surgery, fluid collections  Overall seems to be improving.  K starting to come up.  My feeling is that the hematemesis is just vomiting related, but note Hgb  If K okay tomorrow and Hgb stable, can d/c tomorrow        Author: Lonni SHAUNNA Dalton, MD 01/20/2024 3:24 PM  For on call review [email or web address].

## 2024-01-20 NOTE — Progress Notes (Signed)
 Patient informed about her potassium at 3.1 and that there is an order for her to take 40 meqs of potassium, patient stated Im not gonna take that now, I will take it later. Informed Lynwood Kipper of refusal.

## 2024-01-21 ENCOUNTER — Other Ambulatory Visit (HOSPITAL_COMMUNITY): Payer: Self-pay

## 2024-01-21 LAB — COMPREHENSIVE METABOLIC PANEL WITH GFR
ALT: 30 U/L (ref 0–44)
AST: 47 U/L — ABNORMAL HIGH (ref 15–41)
Albumin: 3 g/dL — ABNORMAL LOW (ref 3.5–5.0)
Alkaline Phosphatase: 65 U/L (ref 38–126)
Anion gap: 9 (ref 5–15)
BUN: 6 mg/dL (ref 6–20)
CO2: 23 mmol/L (ref 22–32)
Calcium: 8.7 mg/dL — ABNORMAL LOW (ref 8.9–10.3)
Chloride: 102 mmol/L (ref 98–111)
Creatinine, Ser: 0.94 mg/dL (ref 0.44–1.00)
GFR, Estimated: >60 mL/min (ref >60)
Glucose, Bld: 147 mg/dL — ABNORMAL HIGH (ref 70–99)
Potassium: 3.1 mmol/L — ABNORMAL LOW (ref 3.5–5.1)
Sodium: 134 mmol/L — ABNORMAL LOW (ref 135–145)
Total Bilirubin: 0.4 mg/dL (ref 0.0–1.2)
Total Protein: 6.3 g/dL — ABNORMAL LOW (ref 6.5–8.1)

## 2024-01-21 LAB — GLUCOSE, CAPILLARY
Glucose-Capillary: 118 mg/dL — ABNORMAL HIGH (ref 70–99)
Glucose-Capillary: 174 mg/dL — ABNORMAL HIGH (ref 70–99)

## 2024-01-21 LAB — CBC
HCT: 32.6 % — ABNORMAL LOW (ref 36.0–46.0)
Hemoglobin: 10.6 g/dL — ABNORMAL LOW (ref 12.0–15.0)
MCH: 30.9 pg (ref 26.0–34.0)
MCHC: 32.5 g/dL (ref 30.0–36.0)
MCV: 95 fL (ref 80.0–100.0)
Platelets: 262 K/uL (ref 150–400)
RBC: 3.43 MIL/uL — ABNORMAL LOW (ref 3.87–5.11)
RDW: 13.2 % (ref 11.5–15.5)
WBC: 5.9 K/uL (ref 4.0–10.5)
nRBC: 0 % (ref 0.0–0.2)

## 2024-01-21 LAB — MAGNESIUM: Magnesium: 1.7 mg/dL (ref 1.7–2.4)

## 2024-01-21 MED ORDER — POTASSIUM CHLORIDE CRYS ER 10 MEQ PO TBCR: 40.0000 meq | EXTENDED_RELEASE_TABLET | ORAL | Status: DC

## 2024-01-21 MED ORDER — POTASSIUM CHLORIDE CRYS ER 10 MEQ PO TBCR
40.0000 meq | EXTENDED_RELEASE_TABLET | ORAL | Status: DC
  Filled 2024-01-21: qty 2

## 2024-01-21 MED ORDER — METOCLOPRAMIDE HCL 5 MG PO TABS
5.0000 mg | ORAL_TABLET | Freq: Three times a day (TID) | ORAL | 0 refills | Status: AC
  Filled 2024-01-21: qty 90, 30d supply, fill #0

## 2024-01-21 MED ORDER — MAGNESIUM SULFATE 2 GM/50ML IV SOLN
2.0000 g | Freq: Once | INTRAVENOUS | Status: AC
  Administered 2024-01-21: 2 g via INTRAVENOUS
  Filled 2024-01-21: qty 50

## 2024-01-21 MED ORDER — POTASSIUM CHLORIDE 20 MEQ PO PACK
40.0000 meq | PACK | ORAL | Status: DC
  Administered 2024-01-21: 40 meq via ORAL
  Filled 2024-01-21: qty 2

## 2024-01-21 NOTE — Discharge Instructions (Signed)
 Misty Walsh,  You were in the hospital with nausea/vomiting. It is not completely clear why you are having these symptoms, but you have improved with the current regimen. Please continue the medication as prescribed and follow-up with your PCP and the GI physician, in addition to your Ob/Gyn physician.

## 2024-01-21 NOTE — Discharge Summary (Signed)
 Physician Discharge Summary   Patient: Misty Walsh MRN: [id number] DOB: [birth year]  Admit date:     01/13/2024  Discharge date: {dischdate:26783}  Discharge Physician: Elgin Lam   PCP: Buck Search, PA-C   Recommendations at discharge:  {Tip this will not be part of the note when signed- Example include specific recommendations for outpatient follow-up, pending tests to follow-up on. (Optional):26781}  ***  Discharge Diagnoses: Principal Problem:   Intractable nausea and vomiting Active Problems:   Hyperlipidemia   Essential hypertension   Non-insulin  dependent type 2 diabetes mellitus (HCC)   History of hysterectomy   History of pulmonary embolism  Resolved Problems:   * No resolved hospital problems. *  Hospital Course: 33 y.o.  F with MO, DM, PE on Eliquis , HTN, HLD and recent hysterectomy complicated by tubo-ovarian abscess, who returns with nausea and vomiting.  CT in the ER shows resolving inflammatory and post-operative changes.  WBC normal.  Assessment and Plan: No notes have been filed under this hospital service. Service: Hospitalist     {Tip this will not be part of the note when signed Body mass index is 48.48 kg/m. , ,  (Optional):26781}  {(NOTE) Pain control PDMP Statment (Optional):26782} Consultants: *** Procedures performed: ***  Disposition: {Plan; Disposition:26390} Diet recommendation:  {Diet_Plan:26776} DISCHARGE MEDICATION: Allergies as of 01/21/2024       Reactions   Macrobid  [nitrofurantoin  Macrocrystal] Itching   Caused the patient to feel hot.        Medication List     PAUSE taking these medications    sertraline  50 MG tablet Wait to take this until your doctor or other care provider tells you to start again. Commonly known as: ZOLOFT  Take 1 tablet (50 mg total) by mouth daily.       STOP taking these medications    chlorthalidone  25 MG tablet Commonly known as: HYGROTON    promethazine  25 MG tablet Commonly  known as: PHENERGAN        TAKE these medications    amLODipine  5 MG tablet Commonly known as: NORVASC  Take 1 tablet (5 mg total) by mouth daily.   atorvastatin  80 MG tablet Commonly known as: LIPITOR  Take 1 tablet (80 mg total) by mouth daily.   busPIRone  5 MG tablet Commonly known as: BUSPAR  Take 1 tablet (5 mg total) by mouth 3 (three) times daily.   docusate sodium  100 MG capsule Commonly known as: COLACE Take 1 capsule (100 mg total) by mouth 2 (two) times daily.   Eliquis  5 MG Tabs tablet Generic drug: apixaban  Take 2 tablets by mouth twice a day for 3 doses then start 1 tablet twice a day thereafter   glycopyrrolate  1 MG tablet Commonly known as: ROBINUL  Take 1 tablet (1 mg total) by mouth 3 (three) times daily.   losartan  50 MG tablet Commonly known as: COZAAR  Take 1 tablet (50 mg total) by mouth daily.   metFORMIN  500 MG tablet Commonly known as: GLUCOPHAGE  Take 1 tablet (500 mg total) by mouth 2 (two) times daily with a meal.   metoCLOPramide  5 MG tablet Commonly known as: Reglan  Take 1 tablet (5 mg total) by mouth 3 (three) times daily before meals.   ondansetron  4 MG tablet Commonly known as: ZOFRAN  Take 1 tablet (4 mg total) by mouth every 6 (six) hours as needed for nausea or vomiting.   scopolamine  1 MG/3DAYS Commonly known as: TRANSDERM-SCOP Place 1 patch (1 mg total) onto the skin every 3 (three) days.  Follow-up Information     Buck Search, PA-C Follow up.   Specialty: Physician Assistant Contact information: [address] [place] [address] [phone]         [place], PA-C .   Specialties: Physician Assistant, Gastroenterology Contact information: [address] [place] [place] [address] [phone]         Fredirick Glenys RAMAN, MD Follow up.   Specialty: Obstetrics and Gynecology Why: Let them know you saw Dr. Marilynn, Dr. Jayne and Dr. Fredirick in the hospital, but can't follow up in  [facility] information: [address] [place] [address] [phone]                Discharge Exam: Fredricka Weights   01/13/24 2140  Weight: 128.1 kg   ***  Condition at discharge: {DC Condition:26389}  The results of significant diagnostics from this hospitalization (including imaging, microbiology, ancillary and laboratory) are listed below for reference.   Imaging Studies: CT ABDOMEN PELVIS W CONTRAST Result Date: 01/13/2024 EXAM: CT ABDOMEN AND PELVIS WITH CONTRAST 01/13/2024 09:14:33 PM TECHNIQUE: CT of the abdomen and pelvis was performed with the administration of 75 mL of iohexol  (OMNIPAQUE ) 350 MG/ML injection. Multiplanar reformatted images are provided for review. Automated exposure control, iterative reconstruction, and/or weight-based adjustment of the mA/kV was utilized to reduce the radiation dose to as low as reasonably achievable. COMPARISON: 01/05/2024 CLINICAL HISTORY: Abdominal pain, acute, nonlocalized. FINDINGS: LOWER CHEST: No acute abnormality. LIVER: The liver is unremarkable. GALLBLADDER AND BILE DUCTS: Gallbladder is unremarkable. No biliary ductal dilatation. SPLEEN: No acute abnormality. PANCREAS: No acute abnormality. ADRENAL GLANDS: No acute abnormality. KIDNEYS, URETERS AND BLADDER: No stones in the kidneys or ureters. No hydronephrosis. No perinephric or periureteral stranding. Urinary bladder is unremarkable. GI AND BOWEL: Stomach demonstrates no acute abnormality. There is no bowel obstruction. Normal appendix. No excessive stool burden. PERITONEUM AND RETROPERITONEUM: Complex inflammatory process again noted in the right hemipelvis. Interval removal of the transgluteal drainage catheter since prior study. There continued to be small fluid collections in the right adnexal region measuring 3 x 1.2 cm on image 83, 1.9 x 1.5 cm on image 79, and 1.6 x 1.2 cm on image 80. These are similar to the prior study. No free air. VASCULATURE:  Aorta is normal in caliber. LYMPH NODES: No lymphadenopathy. REPRODUCTIVE ORGANS: No acute abnormality. BONES AND SOFT TISSUES: No acute osseous abnormality. Fluid collection noted in the right rectus sheath has decreased in size, now 2.6 x 1.1 cm compared to 4.0 x 3.0 x 2.9 cm previously. IMPRESSION: 1. Complex inflammatory process in the right hemipelvis with interval removal of the transgluteal drainage catheter since prior study. 2. Small right adnexal fluid collections, unchanged from the prior study. 3. Decreased size of the right rectus sheath fluid collection. Electronically signed by: Franky Crease MD 01/13/2024 09:24 PM EST RP Workstation: [id number]   VAS US  LOWER EXTREMITY VENOUS (DVT) Result Date: 01/07/2024  Lower Venous DVT Study Patient Name:  JULIANN OLESKY  Date of Exam:   01/07/2024 Medical Rec #: [id number]         Accession #:    [id number] Date of Birth: 1990-11-26         Patient Gender: F Patient Age:   42 years Exam Location:  [facility] Procedure:      VAS US  LOWER EXTREMITY VENOUS (DVT) Referring Phys: ROLLO BRING --------------------------------------------------------------------------------  Indications: Pain, Swelling, and H/O post-op DVT.  Comparison Study: Previous study on  10.22.2025. Performing Technologist: Edilia Elden Appl  Examination Guidelines: A complete evaluation includes B-mode imaging, spectral Doppler, color Doppler, and power Doppler as needed of all accessible portions of each vessel. Bilateral testing is considered an integral part of a complete examination. Limited examinations for reoccurring indications may be performed as noted. The reflux portion of the exam is performed with the patient in reverse Trendelenburg.  +---------+---------------+---------+-----------+----------+--------------+ RIGHT    CompressibilityPhasicitySpontaneityPropertiesThrombus Aging +---------+---------------+---------+-----------+----------+--------------+ CFV       Full           Yes      Yes                                 +---------+---------------+---------+-----------+----------+--------------+ SFJ      Full           Yes      Yes                                 +---------+---------------+---------+-----------+----------+--------------+ FV Prox  Full                                                        +---------+---------------+---------+-----------+----------+--------------+ FV Mid   Full                                                        +---------+---------------+---------+-----------+----------+--------------+ FV DistalFull                                                        +---------+---------------+---------+-----------+----------+--------------+ PFV      Full                                                        +---------+---------------+---------+-----------+----------+--------------+ POP      Full           Yes      Yes                                 +---------+---------------+---------+-----------+----------+--------------+ PTV      Full                                                        +---------+---------------+---------+-----------+----------+--------------+ PERO     None           Yes      No                                  +---------+---------------+---------+-----------+----------+--------------+  Deep vein thrombosis noted in one of the paired peroneal veins.   +---------+---------------+---------+-----------+----------+--------------+ LEFT     CompressibilityPhasicitySpontaneityPropertiesThrombus Aging +---------+---------------+---------+-----------+----------+--------------+ CFV      Full           Yes      Yes                                 +---------+---------------+---------+-----------+----------+--------------+ SFJ      Full           Yes      Yes                                  +---------+---------------+---------+-----------+----------+--------------+ FV Prox  Full                                                        +---------+---------------+---------+-----------+----------+--------------+ FV Mid   Full                                                        +---------+---------------+---------+-----------+----------+--------------+ FV DistalFull                                                        +---------+---------------+---------+-----------+----------+--------------+ PFV      Full                                                        +---------+---------------+---------+-----------+----------+--------------+ POP      Full                                                        +---------+---------------+---------+-----------+----------+--------------+ PTV      Full                                                        +---------+---------------+---------+-----------+----------+--------------+ Deep vein thrombosis noted in one of the paired peroneal veins at the proximal segment. Middle and distal portions are compressible.    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  - Findings appear improved from previous examination. - No cystic structure found in the popliteal fossa.  LEFT: - Findings consistent with age indeterminate deep vein thrombosis involving the left peroneal veins.  - Findings appear improved from previous examination. - No cystic structure found in the popliteal fossa.  *See table(s) above for  measurements and observations. Electronically signed by Lonni Gaskins MD on 01/07/2024 at 5:50:00 PM.    Final    IR INJECT INDWELLING DRAINAGE CATHETER Result Date: 01/07/2024 INDICATION: Pelvic abscess status post percutaneous drain 12/17/2023 EXAM: FLUOROSCOPIC INJECTION OF THE PERCUTANEOUS DRAIN FOLLOWED BY REMOVAL MEDICATIONS: The patient is  currently admitted to the hospital and receiving intravenous antibiotics. The antibiotics were administered within an appropriate time frame prior to the initiation of the procedure. ANESTHESIA/SEDATION: NONE. COMPLICATIONS: None immediate. PROCEDURE: Informed written consent was obtained from the patient after a thorough discussion of the procedural risks, benefits and alternatives. All questions were addressed. Maximal Sterile Barrier Technique was utilized including caps, mask, sterile gowns, sterile gloves, sterile drape, hand hygiene and skin antiseptic. A timeout was performed prior to the initiation of the procedure. previous imaging reviewed. CT confirms near complete resolution of the right hemipelvis fluid collection. stable drain catheter position. fluoroscopic injection of the drain demonstrates small residual collapsed cavity at the drain site. negative for fistula. Drain was cut and removed today without difficulty. IMPRESSION: Resolved right hemipelvis abscess. Negative for fistula. Drain removed. Electronically Signed   By: CHRISTELLA.  Shick M.D.   On: 01/07/2024 09:23   CT ABDOMEN PELVIS W CONTRAST Result Date: 01/06/2024 CLINICAL DATA:  Intra-abdominal abscess EXAM: CT ABDOMEN AND PELVIS WITH CONTRAST TECHNIQUE: Multidetector CT imaging of the abdomen and pelvis was performed using the standard protocol following bolus administration of intravenous contrast. RADIATION DOSE REDUCTION: This exam was performed according to the departmental dose-optimization program which includes automated exposure control, adjustment of the mA and/or kV according to patient size and/or use of iterative reconstruction technique. CONTRAST:  75mL OMNIPAQUE  IOHEXOL  350 MG/ML SOLN COMPARISON:  12/29/2023 FINDINGS: Lower chest: Soft tissue nodules in the right breast are again identified and not substantially changed compared to 12/29/2023. 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 Mild cardiomegaly. Hepatobiliary:  Layering density in the gallbladder could be from sludge or small gallstones. Otherwise unremarkable. Pancreas: Unremarkable Spleen: Unremarkable Adrenals/Urinary Tract: Adrenal glands unremarkable Heterogeneous right renal enhancement associated with mild to moderate right hydronephrosis, and right hydroureter extending down into the pelvis where the ureter becomes indistinct adjacent to the known regional right pelvic abscesses. Right renal parenchymal Hypoenhancement could be from obstruction or pyelonephritis. Stomach/Bowel: The rectum is in close proximity to the complex inflammatory process in the right hip anatomic pelvis. Vascular/Lymphatic: Unremarkable Reproductive: Indistinct margins of the uterus due to the adjacent right anatomic pelvic inflammatory process. The right ovary is likewise indistinct. Other: In the right hemipelvis we demonstrate an approximately 6.6 by 4.0 by 3.2 cm inflammatory process. Posteriorly within this process is the formed loop of a percutaneous drainage catheter with a trace amount of adjacent gas and fluid density. Further anteriorly, a 2.3 by 1.8 by 2.1 cm fluid density is present within this process and may represent a separate abscess loculation. Adjacent inflammatory stranding tracks along the right paracolic gutter and right pelvic sidewall. Edema along the lower omentum noted. Within the anterior right rectus sheath and bulging into the subcutaneous tissues, a 4.0 by 2.9 by 3.0 cm fluid collection is present as on image 77 series 7. This is reduced in size from previous and no longer has internal gas density. There is surrounding stranding in the subcutaneous tissues and tracking along the umbilicus. Small fluid collection along the inferior margin of the umbilicus measures about 1.9 by 2.8 by 1.8 cm on image 69 series 3, reduced in size from previous and no longer containing internal gas density. Small  hematoma or injection site in the subcutaneous tissues of the right  anterior abdomen on image 57 series 3. Musculoskeletal: No additional significant findings. IMPRESSION: 1. Persistent inflammatory process in the right hemipelvis with a formed loop of a percutaneous drainage catheter in place. The drained posterior abscess component appears reduced in volume compared to previous, and there continues to be a 2.3 by 1.8 by 2.1 cm fluid density anteriorly within this process which may represent a separate abscess loculation. 2. Mild to moderate right hydronephrosis and hydroureter extending down into the pelvis where the ureter becomes indistinct adjacent to the known regional right pelvic abscesses. Right renal parenchymal hypoenhancement could be from obstruction or pyelonephritis. 3. Reduced size of the fluid collection in the anterior right rectus sheath and bulging into the subcutaneous tissues. 4. Reduced size of the small fluid collection along the inferior margin of the umbilicus. 5. Layering density in the gallbladder could be from sludge or small gallstones. 6. Mild cardiomegaly. 7. Soft tissue nodules in the right breast are not substantially changed compared to 12/29/2023. 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 Electronically Signed   By: Ryan Salvage M.D.   On: 01/06/2024 09:12   IR US  Guide Bx Asp/Drain Result Date: 01/02/2024 INDICATION: Abscess. Briefly, 33 year old female with parapelvic gaseous s/p drain placement and recent imaging with anterior abdominal wall fluid collection suspicious for subcutaneous abscesses. EXAM: US -GUIDED ASPIRATION OF ANTERIOR ABDOMINAL WALL SUBCUTANEOUS FLUID COLLECTION/ABSCESS X2 COMPARISON:  CT CAP, 12/29/2023. MEDICATIONS: The patient is currently admitted to the hospital and receiving intravenous antibiotics. The antibiotics were administered within an appropriate time frame prior to the initiation of the procedure. ANESTHESIA/SEDATION: Local anesthetic was administered. CONTRAST:  None COMPLICATIONS: None immediate.  PROCEDURE: Informed written consent was obtained from the patient after a discussion of the risks, benefits and alternatives to treatment. Preprocedural ultrasound scanning demonstrated two (2) anterior abdominal wall fluid collections; one midline and superficial at the umbilicus and the other deeper and RIGHT paraumbilical. A timeout was performed prior to the initiation of the procedure. The midline and RIGHT lower quadrant abdomen was prepped and draped in the usual sterile fashion. The overlying soft tissues were anesthetized with 1% lidocaine  with epinephrine . Procedure began at the midline collection, and under direct ultrasound guidance, a 18 gauge trocar needle was advanced into the abscess/fluid collection. Next, approximately 10 mL of serosanguineous fluid was aspirated from the collection. The procedure was then repeated for the deeper, RIGHT paraumbilical fluid collection with aspiration yielding 1 mL of serosanguineous fluid. Multiple ultrasound images were saved for procedural documentation purposes. Representative samples of aspirated fluid was capped and sent to the laboratory for analysis. The needles were removed and superficial hemostasis was achieved with manual compression. A dressing was placed. The patient tolerated the procedure well without immediate postprocedural complication. IMPRESSION: Successful US -guided diagnostic aspiration of two (2) anterior abdominal wall fluid collections; midline at umbilicus and deeper, RIGHT paraumbilical. The collection yielded of 10 mL and 1 mL of serosanguineous fluid, respectively. Representative samples were submitted for microbiological analysis as requested by the clinical team. Thom Hall, MD [facility] Radiology Electronically Signed   By: Thom Hall M.D.   On: 01/02/2024 09:11   IR US  Guide Bx Asp/Drain Result Date: 01/02/2024 INDICATION: Abscess. Briefly, 33 year old female with parapelvic  gaseous s/p drain placement and recent imaging with anterior abdominal wall fluid collection suspicious for subcutaneous abscesses. EXAM: US -GUIDED ASPIRATION OF ANTERIOR ABDOMINAL WALL SUBCUTANEOUS FLUID COLLECTION/ABSCESS X2 COMPARISON:  CT CAP, 12/29/2023. MEDICATIONS: The  patient is currently admitted to the hospital and receiving intravenous antibiotics. The antibiotics were administered within an appropriate time frame prior to the initiation of the procedure. ANESTHESIA/SEDATION: Local anesthetic was administered. CONTRAST:  None COMPLICATIONS: None immediate. PROCEDURE: Informed written consent was obtained from the patient after a discussion of the risks, benefits and alternatives to treatment. Preprocedural ultrasound scanning demonstrated two (2) anterior abdominal wall fluid collections; one midline and superficial at the umbilicus and the other deeper and RIGHT paraumbilical. A timeout was performed prior to the initiation of the procedure. The midline and RIGHT lower quadrant abdomen was prepped and draped in the usual sterile fashion. The overlying soft tissues were anesthetized with 1% lidocaine  with epinephrine . Procedure began at the midline collection, and under direct ultrasound guidance, a 18 gauge trocar needle was advanced into the abscess/fluid collection. Next, approximately 10 mL of serosanguineous fluid was aspirated from the collection. The procedure was then repeated for the deeper, RIGHT paraumbilical fluid collection with aspiration yielding 1 mL of serosanguineous fluid. Multiple ultrasound images were saved for procedural documentation purposes. Representative samples of aspirated fluid was capped and sent to the laboratory for analysis. The needles were removed and superficial hemostasis was achieved with manual compression. A dressing was placed. The patient tolerated the procedure well without immediate postprocedural complication. IMPRESSION: Successful US -guided diagnostic  aspiration of two (2) anterior abdominal wall fluid collections; midline at umbilicus and deeper, RIGHT paraumbilical. The collection yielded of 10 mL and 1 mL of serosanguineous fluid, respectively. Representative samples were submitted for microbiological analysis as requested by the clinical team. Thom Hall, MD [facility] Radiology Electronically Signed   By: Thom Hall M.D.   On: 01/02/2024 09:11   CT CHEST ABDOMEN PELVIS W CONTRAST Addendum Date: 12/31/2023 ADDENDUM REPORT: 12/31/2023 13:16 ADDENDUM: The following correction is made to the original report: Patient has not had a hysterectomy. Uterus is visualized. Electronically Signed   By: Newell Eke M.D.   On: 12/31/2023 13:16   Result Date: 12/31/2023 CLINICAL DATA:  Hysterectomy 12/08/2023. Pelvic abscesses and sepsis. Refractory nausea and vomiting. EXAM: CT CHEST, ABDOMEN, AND PELVIS WITH CONTRAST TECHNIQUE: Multidetector CT imaging of the chest, abdomen and pelvis was performed following the standard protocol during bolus administration of intravenous contrast. RADIATION DOSE REDUCTION: This exam was performed according to the departmental dose-optimization program which includes automated exposure control, adjustment of the mA and/or kV according to patient size and/or use of iterative reconstruction technique. CONTRAST:  75mL OMNIPAQUE  IOHEXOL  350 MG/ML SOLN COMPARISON:  CT abdomen pelvis 12/26/2023 and CT chest 12/09/2023. FINDINGS: CT CHEST FINDINGS Cardiovascular: Heart is mildly enlarged.  No pericardial effusion. Mediastinum/Nodes: No pathologically enlarged mediastinal, hilar or axillary lymph nodes. Three soft tissue nodules in a right breast measure up to 1.1 x 1.9 cm medially. Esophagus is grossly unremarkable. Lungs/Pleura: Mild bibasilar atelectasis. No pleural fluid. Airway is unremarkable. Musculoskeletal: Degenerative changes in the spine. CT ABDOMEN PELVIS FINDINGS  Hepatobiliary: Liver is decreased in attenuation diffusely and is enlarged, 19.2 cm. Vicarious excretion of contrast in the gallbladder. No biliary ductal dilatation. Pancreas: Negative. Spleen: Negative. Adrenals/Urinary Tract: Adrenal glands are unremarkable. Persistent moderate right hydronephrosis with decreased attenuation of the right renal parenchyma and no excretion of contrast on delayed imaging. No obstructing stone. Left kidney is unremarkable. Left ureter is decompressed. Bladder is grossly unremarkable. Stomach/Bowel: Stomach, small bowel, appendix and colon are unremarkable. Vascular/Lymphatic: Vascular structures are unremarkable. No pathologically enlarged lymph nodes. Reproductive: Hysterectomy. Percutaneous drain in the  cul-de-sac region with a residual 4.7 x 8.0 cm heterogeneous fluid collection, grossly similar to 12/26/2023. Separate rounded low-attenuation lesion along the superior margin of the abscess measures 2.5 cm (3/98), unchanged and which may represent the right ovary. Postsurgical inflammatory stranding in the ventral pelvis. Collection of fluid and air involving the inferior right rectus abdominus measures 3.3 x 4.3 cm (3/88), slightly decreased in size from 4.0 x 5.8 cm on 12/26/2023. A second subcutaneous fluid collection is seen deep to the umbilicus, measuring 4.7 x 5.3 cm (3/97), stable to minimally enlarged from 4.5 x 4.9 cm on 12/26/2023. Other: Mild presacral edema.  Small pelvic free fluid.  No free air. Musculoskeletal: None. IMPRESSION: 1. Similar right adnexal abscess with percutaneous drain in place. 2. Subcutaneous collection of fluid and air involving the right rectus abdominus muscle as has decreased slightly in size in the interval and is worrisome for an abscess. 3. Retro umbilical fluid collection is stable to minimally enlarged and may represent a seroma. Difficult to exclude abscess. 4. Persistent moderate right hydronephrosis with decreased renal function, likely  related to the aforementioned pelvic abscess. 5. Steatotic enlarged liver. Electronically Signed: By: Newell Eke M.D. On: 12/31/2023 09:49   MR MRV HEAD W WO CONTRAST Result Date: 12/31/2023 CLINICAL DATA:  Initial evaluation for headache, intractable nausea and vomiting. EXAM: MR VENOGRAM HEAD WITHOUT AND WITH CONTRAST TECHNIQUE: Angiographic images of the intracranial venous structures were acquired using MRV technique without and with intravenous contrast. CONTRAST:  10mL GADAVIST  GADOBUTROL  1 MMOL/ML IV SOLN COMPARISON:  Comparison made with brain MRI performed at the same time. FINDINGS: Examination degraded by motion artifact. Normal flow related signal and enhancement seen throughout the superior sagittal sinus to the torcula. Transverse and sigmoid sinuses are patent as are the jugular bulbs and visualized proximal internal jugular veins. Straight sinus, vein of Galen, and internal cerebral veins are patent. No evidence for dural venous sinus thrombosis. No appreciable abnormality about the cavernous sinus. Superior orbital veins are symmetric and within normal limits. No appreciable cortical vein abnormality. IMPRESSION: Negative intracranial MRV. No evidence for dural venous sinus thrombosis. Electronically Signed   By: Morene Hoard M.D.   On: 12/31/2023 00:24   MR BRAIN W WO CONTRAST Result Date: 12/31/2023 CLINICAL DATA:  Initial evaluation for headache, intractable nausea and vomiting. EXAM: MRI HEAD WITHOUT AND WITH CONTRAST TECHNIQUE: Multiplanar, multiecho pulse sequences of the brain and surrounding structures were obtained without and with intravenous contrast. CONTRAST:  10mL GADAVIST  GADOBUTROL  1 MMOL/ML IV SOLN COMPARISON:  None Available. FINDINGS: Brain: Examination degraded by motion artifact. Cerebral volume within normal limits. Few subcentimeter FLAIR hyperintensity seen involving the periventricular white matter, nonspecific, but overall minimal in nature. No evidence  for acute or subacute infarct. No areas of chronic cortical infarction. No acute or chronic intracranial blood products. No mass lesion, midline shift or mass effect. No hydrocephalus or extra-axial fluid collection. Pituitary gland within normal limits. No abnormal enhancement. Vascular: Major intracranial vascular flow voids are maintained. 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 Craniocervical junction otherwise unremarkable. Decreased T1 signal intensity within the visualized bone marrow, nonspecific, but most commonly related to anemia, smoking, or obesity. No scalp soft tissue abnormality. Sinuses/Orbits: Globes and orbital soft tissues within normal limits. Mild scattered mucosal thickening present about the sphenoid ethmoidal sinuses. Paranasal sinuses are otherwise clear. No mastoid effusion. Other: None. IMPRESSION: 1. No acute intracranial abnormality. 2. 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 3. Otherwise essentially normal and unremarkable brain MRI. Electronically Signed   By: Morene Hoard M.D.   On: 12/31/2023 00:18   US  EKG SITE RITE Result Date: 12/30/2023 If Site Rite image not attached, placement could not be confirmed due to current cardiac rhythm.  CT ABDOMEN PELVIS W CONTRAST Result Date: 12/26/2023 EXAM: CT ABDOMEN AND PELVIS WITH CONTRAST 12/26/2023 04:53:00 PM TECHNIQUE: CT of the abdomen and pelvis was performed with the administration of 75 mL of iohexol  (OMNIPAQUE ) 350 MG/ML injection. Multiplanar reformatted images are provided for review. Automated exposure control, iterative reconstruction, and/or weight-based adjustment of the mA/kV was utilized to reduce the radiation dose to as low as reasonably achievable. COMPARISON: CT / 26 / 25 CLINICAL HISTORY: Abdominal pain, post-op. FINDINGS: LOWER CHEST: No acute abnormality. LIVER: Hepatic steatosis.  GALLBLADDER AND BILE DUCTS: Gallbladder is unremarkable. No biliary ductal dilatation. SPLEEN: No acute abnormality. PANCREAS: No acute abnormality. ADRENAL GLANDS: No acute abnormality. KIDNEYS, URETERS AND BLADDER: Resolution of the previous left hydroureteronephrosis. Similar mild right hydroureteronephrosis with abrupt tapering of the right ureter as it crosses the iliac vessels in the region of the pelvic fluid collection. No stones in the kidneys or ureters. No perinephric or periureteral stranding. Urinary bladder is unremarkable. GI AND BOWEL: Stomach demonstrates no acute abnormality. Decreased wall thickening and adjacent stranding about the sigmoid colon where it abuts the fluid collection. There is no bowel obstruction. PERITONEUM AND RETROPERITONEUM: Interval removal of the previous ventral abdominal wall percutaneous drains with placement of a right transgluteal drain in the pelvic cul-de-sac. Thick walled multiloculated fluid and gas collection in the cul-de-sac measures approximately 8.3 x 5.9 cm, decreased from prior when it measured 11 x 5 cm. The transgluteal drain appears to control this collection. Additional smaller loculated fluid collections superior to the larger collection are unchanged. Fluid and gas collection in the right ventral abdominal wall/right rectus sheath measures 4.0 x 6.0 cm (series 3 image 57). Additional fluid collection in the midline low anterior abdomen subcutaneous fat measures 4.1 x 5.0 cm. These are increased compared to prior. No frank free air. VASCULATURE: Aorta is normal in caliber. LYMPH NODES: No lymphadenopathy. REPRODUCTIVE ORGANS: No acute abnormality. BONES AND SOFT TISSUES: No acute osseous abnormality. No focal soft tissue abnormality. IMPRESSION: 1. Interval removal of previous ventral abdominal wall drains with new right transgluteal drain. The dominant fluid collection in the pelvic cul-de-sac as decreased from prior after drain placement. 2. Increasing  right ventral abdominal wall/rectus sheath fluid-gas collection and midline low anterior abdominal subcutaneous fluid collection have increased from prior. 3. Similar mild right hydroureteronephrosis with abrupt tapering of the right ureter at the pelvic collection; resolution of prior left hydroureteronephrosis. 4. Decreased sigmoid colon wall thickening and adjacent stranding where it abuts the collection. Electronically signed by: Norman Gatlin MD 12/26/2023 05:30 PM EST RP Workstation: [id number]   DG Abdomen 1 View Result Date: 12/26/2023 EXAM: 1 VIEW XRAY OF THE ABDOMEN 12/26/2023 04:18:00 PM COMPARISON: 12/16/2023 CLINICAL HISTORY: nausea vomiting abdominal pain FINDINGS: LINES, TUBES AND DEVICES: Drainage catheter in right pelvis. BOWEL: Paucity of bowel gas. No gaseous distention of bowel loops. SOFT TISSUES: No opaque urinary calculi. BONES: No acute osseous abnormality. IMPRESSION: 1. No acute findings. 2. Paucity of bowel gas, nonspecific . Electronically signed by: Rockey Kilts MD 12/26/2023 04:43 PM EST RP Workstation: [id number]    Microbiology: Results for orders placed or performed during the hospital encounter of 12/26/23  Aerobic/Anaerobic  Culture w Gram Stain (surgical/deep wound)     Status: None   Collection Time: 01/01/24  9:49 AM   Specimen: Abscess  Result Value Ref Range Status   Specimen Description ABSCESS  Final   Special Requests NONE  Final   Gram Stain   Final    FEW WBC PRESENT, PREDOMINANTLY PMN NO ORGANISMS SEEN    Culture   Final    No growth aerobically or anaerobically. Performed at [facility], [address], [place], [place] [address]    Report Status 01/06/2024 FINAL  Final  Aerobic/Anaerobic Culture w Gram Stain (surgical/deep wound)     Status: None   Collection Time: 01/01/24  9:51 AM   Specimen: Abscess  Result Value Ref Range Status   Specimen Description ABSCESS  Final   Special Requests NONE  Final   Gram Stain   Final    ABUNDANT  WBC PRESENT, PREDOMINANTLY PMN NO ORGANISMS SEEN    Culture   Final    No growth aerobically or anaerobically. Performed at [facility], [address], [place], [place] [address]    Report Status 01/06/2024 FINAL  Final    Labs: CBC: Recent Labs  Lab 01/17/24 0150 01/18/24 0236 01/19/24 0555 01/20/24 0400 01/21/24 0529  WBC 6.4 6.4 5.2 5.7 5.9  HGB 11.2* 11.3* 12.0 10.9* 10.6*  HCT 34.7* 34.2* 36.1 32.8* 32.6*  MCV 95.1 93.7 92.8 93.2 95.0  PLT 323 301 267 284 262   Basic Metabolic Panel: Recent Labs  Lab 01/16/24 0524 01/17/24 0150 01/18/24 0236 01/19/24 0555 01/20/24 0400 01/21/24 0529  NA 140 137 133* 135 138 134*  K 2.9* 2.8* 2.8* 2.8* 3.1* 3.1*  CL 99 98 97* 97* 102 102  CO2 26 27 26 30 27 23   GLUCOSE 123* 108* 115* 133* 113* 147*  BUN 9 7 <5* <5* 6 6  CREATININE 1.14* 1.01* 0.94 1.00 0.94 0.94  CALCIUM  9.5 9.4 9.3 9.5 9.1 8.7*  MG 1.8  --   --   --  1.6* 1.7   Liver Function Tests: Recent Labs  Lab 01/17/24 0150 01/18/24 0236 01/19/24 0555 01/20/24 0400 01/21/24 0529  AST 18 20 28 20  47*  ALT 15 17 23 20 30   ALKPHOS 49 51 56 50 65  BILITOT 0.8 0.8 0.6 0.5 0.4  PROT 7.1 6.7 7.0 6.5 6.3*  ALBUMIN 3.6 3.4* 3.5 3.2* 3.0*   CBG: Recent Labs  Lab 01/20/24 1111 01/20/24 1640 01/20/24 2109 01/21/24 0605 01/21/24 1137  GLUCAP 192* 134* 153* 118* 174*    Discharge time spent: {LESS THAN/GREATER THAN:26388} 30 minutes.  Signed: Elgin Lam, MD [facility] 01/21/2024

## 2024-01-21 NOTE — Plan of Care (Signed)
  Problem: Fluid Volume: Goal: Ability to maintain a balanced intake and output will improve Outcome: Progressing   Problem: Health Behavior/Discharge Planning: Goal: Ability to manage health-related needs will improve Outcome: Progressing   Problem: Nutritional: Goal: Maintenance of adequate nutrition will improve Outcome: Progressing   Problem: Education: Goal: Knowledge of General Education information will improve Description: Including pain rating scale, medication(s)/side effects and non-pharmacologic comfort measures Outcome: Progressing

## 2024-02-23 NOTE — Progress Notes (Deleted)
 [facility] CONSULT NOTE  Patient Care Team: Buck Search, PA-C as PCP - General (Physician Assistant) Lonni Slain, MD as PCP - Cardiology (Cardiology) Katrinka Aquas, MD as Referring Physician (Internal Medicine) Katrinka Aquas, MD as Referring Physician (Internal Medicine)   ASSESSMENT & PLAN:  34 y.o.female with history of HTN, HLD, PE referred to [facility] for history of ***.   Assessment & Plan   First episode: Second episode: Third episode: Setting: Treatment:  Patient education for risk factors and prevention of clotting We talked about modifiable risk factors.  Prevention of clotting like deep vein thrombosis including: Strong risk factors including fractures of lower limb, hospitalization for severe illness, such as heart failure, myocardial infarction, spinal cord injury, major trauma, hip or knee replacement, and previous VTE. avoid prolonged immobilization and moving extremities every 1-2 hours during long car rides or flights.  Taking a break and moving extremities if working in a job setting with prolonged sitting.   Avoid dehydration, especially in high altitude. Avoid cigarette smoking Avoid use of hormone replacement therapy, estrogen-containing contraceptive in women.   Maintaining healthy lifestyle to prevent development of diabetes.  Weight loss if BMI over 30.  Regular exercises but not extreme.  Stay hydrated with exercises. Other risk factors for clotting are surgery, hospitalization, inflammatory disease or severe infection, trauma or injuries from inflammatory state and stasis. If developing one-sided leg swelling, pain, color change, chest pain, sudden short of breath, difficulty taking deep breaths, taking deep breath with chest discomfort or pain, dizziness or heart racing sensation, go to the emergency room immediately for evaluation. If developing trauma, uncontrolled bleeding, such as bloody  stools report ED immediately. Avoid NSAIDs, aspirin while on blood thinner.  Patient should avoid elective surgery in the acute thrombosis period for 3 months.  Discussed bleeding precautions and avoid high risk activities for falling while on anticoagulation. Anticoagulants do not cause bleeding.  Rather, if bleeding occurs when one is on anticoagulation, it may take longer for the bleeding to stop due to reduce coagulation capacity.  I reviewed with the patient about the plan for care for recurrent ***DVT/PE.  This last episode of blood clot appeared to be ***unprovoked. We discussed about the pros and cons about testing for thrombophilia disorder. For known thrombophilia testing, most of which do not impact the risk of recurrence and do not change the therapeutic options or recommendations for duration of therapy.  These include protein C, protein S and antithrombin deficiencies, factor V Leiden, the prothrombin G20210A mutation and antiphospholipid antibody syndrome.  Patient was informed even if these are negative and the family history is negative, this does not rule out an inheritable component to hypercoagulability. Patient can still develop recurrent venous thrombosis with negative testing. For patients with unprovoked VTE, the risks of recurrent VTE after completing a course of anticoagulant therapy have been estimated to be 10% by 2 years and >30% by 10 years. ASH 2020. Therefore, I do not see a reason to order excessive testing to screen for thrombophilia disorder as it would not change our management. The goal of anticoagulation therapy is for life.   We discussed about various options of anticoagulation therapies including warfarin, low molecular weight heparin  such as Lovenox  or newer agents such as Rivaroxaban. Some of the risks and benefits discussed including costs involved, the need for monitoring, risks of life-threatening bleeding/hospitalization, reversibility of each agent in the  event of bleeding or overdose, safety profile of each drug  and taking into account other social issues such as ease of administration of medications, etc. Ultimately, we have made an informed decision for the patient to continue her treatment with ***  Finally, at the end of our consultation today, I reinforced the importance of preventive strategies such as avoiding hormonal supplement, avoiding cigarette smoking, keeping up-to-date with screening programs for early cancer detection, frequent ambulation for long distance travel and aggressive DVT prophylaxis in all surgical settings.  ***I have not made a return appointment for the patient to come back. I would be happy to assist in perioperative DVT management in the future should she need any interruption of her anticoagulation therapy for elective procedures.  No orders of the defined types were placed in this encounter.   All questions were answered. The patient knows to call the clinic with any problems, questions or concerns.  I personally spent a total of *** minutes in the care of the patient today including {Time Based Coding:[id number]}.   Pauletta JAYSON Chihuahua, MD 1/5/202610:48 PM  CHIEF COMPLAINTS/PURPOSE OF CONSULTATION:  ***  HISTORY OF PRESENTING ILLNESS:  Misty Walsh 34 y.o. female is here because of ***  ***She denies ***lower extremity swelling, warmth, tenderness & erythema.  She denies recent chest pain on exertion, shortness of breath on minimal exertion, pre-syncopal episodes, hemoptysis, or palpitation. ***She denies recent *** history of trauma, *** long distance travel, dehydration, recent surgery, ***smoking or prolonged immobilization. ***She had no prior history or diagnosis of cancer. Her age appropriate screening programs are up-to-date. ***She had prior surgeries before and never had perioperative thromboembolic events. ***The patient had been exposed to birth control pills *** hormone replacement therapy ***  testosterone replacement therapy and never had thrombotic events. ***The patient had been pregnant before and denies history of peripartum thromboembolic event or history of recurrent miscarriages. ***There is no family history of blood clots or miscarriages.  MEDICAL HISTORY:  Past Medical History:  Diagnosis Date   Anxiety    Back pain    Chlamydia    Depression med made her navel itching and  made her sleepy so she quit taking them   Diabetes mellitus without complication (HCC)    Edema, lower extremity    Elevated cholesterol    Fibrocystic breast changes of both breasts    GERD (gastroesophageal reflux disease)    History of cesarean section, classical 12/18/2012   2014    Human papilloma virus    Hypertension    Insomnia    Joint pain    Moderate dysplasia of cervix    Numbness    right arm to hand   Ovarian cyst    Prediabetes    Tubo-ovarian abscess 12/02/2023   Vitamin D  deficiency     SURGICAL HISTORY: Past Surgical History:  Procedure Laterality Date   ABDOMINAL HYSTERECTOMY N/A 12/08/2023   Procedure: HYSTERECTOMY, ABDOMINAL;  Surgeon: Marilynn Nest, DO;  Location: [facility] OR;  Service: Gynecology;  Laterality: N/A;  W/ EXPLORATORY LAPAROTOMY   BREAST BIOPSY Left 2012   benign fibroadeoma   BREAST SURGERY     CESAREAN SECTION N/A 12/13/2012   Procedure: PRIMARY CESAREAN SECTION;  Surgeon: Aida DELENA Na, MD;  Location: [facility] ORS;  Service: Obstetrics;  Laterality: N/A;   ESOPHAGOGASTRODUODENOSCOPY N/A 12/29/2023   Procedure: EGD (ESOPHAGOGASTRODUODENOSCOPY);  Surgeon: Leigh Elspeth SQUIBB, MD;  Location: [facility] ENDOSCOPY;  Service: Gastroenterology;  Laterality: N/A;   IR CV LINE INJECTION  01/07/2024   IR US  GUIDE BX ASP/DRAIN  01/01/2024  IR US  GUIDE BX ASP/DRAIN  01/01/2024   LEEP      SOCIAL HISTORY: Social History   Socioeconomic History   Marital status: Single    Spouse name: Not on file   Number of children: 1   Years of education: Not on file    Highest education level: 11th grade  Occupational History   Occupation: work at home    Comment: at home  Tobacco Use   Smoking status: Former    Current packs/day: 0.00    Average packs/day: 0.3 packs/day for 5.0 years (1.3 ttl pk-yrs)    Types: Cigarettes    Start date: 12/20/2014    Quit date: 12/20/2019    Years since quitting: 4.1   Smokeless tobacco: Never  Vaping Use   Vaping status: Never Used  Substance and Sexual Activity   Alcohol use: Not Currently    Comment: occ   Drug use: No   Sexual activity: Not Currently    Birth control/protection: Condom  Other Topics Concern   Not on file  Social History Narrative   Lives with her child   Caffeine- green tea, Poweraid, grape juice   Social Drivers of Health   Tobacco Use: Medium Risk (01/14/2024)   Patient History    Smoking Tobacco Use: Former    Smokeless Tobacco Use: Never    Passive Exposure: Not on Actuary Strain: Not on File (06/07/2021)   Received from [employer]    Financial Resource Strain: 0  Food Insecurity: No Food Insecurity (01/14/2024)   Epic    Worried About Programme Researcher, Broadcasting/film/video in the Last Year: Never true    Ran Out of Food in the Last Year: Never true  Transportation Needs: No Transportation Needs (01/14/2024)   Epic    Lack of Transportation (Medical): No    Lack of Transportation (Non-Medical): No  Physical Activity: Not on File (06/07/2021)   Received from [facility]   Physical Activity    Physical Activity: 0  Stress: Not on File (06/07/2021)   [facility]   Stress    Stress: 0  Social Connections: Not on File (11/04/2022)   Received from [facility]   Social Connections    Connectedness: 0  Intimate Partner Violence: Not At Risk (01/14/2024)   Epic    Fear of Current or Ex-Partner: No    Emotionally Abused: No    Physically Abused: No    Sexually Abused: No  Depression (PHQ2-9): Low Risk (08/15/2022)   Depression (PHQ2-9)    PHQ-2 Score: 0   Alcohol Screen: Not on file  Housing: Low Risk (01/14/2024)   Epic    Unable to Pay for Housing in the Last Year: No    Number of Times Moved in the Last Year: 0    Homeless in the Last Year: No  Utilities: Not At Risk (01/14/2024)   Epic    Threatened with loss of utilities: No  Health Literacy: Not on file    FAMILY HISTORY: Family History  Problem Relation Age of Onset   Diabetes Mother    Heart disease Mother    Hypertension Mother    Depression Mother    Stroke Mother    Arthritis Mother    Anxiety disorder Mother    Stroke Father    High blood pressure Father    High Cholesterol Father    Depression Father    Anxiety disorder Father    Obesity Father  Bipolar disorder Father    Asthma Sister    Kidney disease Brother        genetic condition   Hypertension Maternal Grandmother    Dementia Maternal Grandmother    Heart disease Maternal Grandmother    Diabetes Paternal Grandmother    Diabetes Maternal Aunt    Hypertension Maternal Aunt    Asthma Maternal Aunt    Epilepsy Maternal Aunt     ALLERGIES:  is allergic to macrobid  [nitrofurantoin  macrocrystal].  MEDICATIONS:  Current Outpatient Medications  Medication Sig Dispense Refill   amLODipine  (NORVASC ) 5 MG tablet Take 1 tablet (5 mg total) by mouth daily. 30 tablet 0   apixaban  (ELIQUIS ) 5 MG TABS tablet Take 2 tablets by mouth twice a day for 3 doses then start 1 tablet twice a day thereafter 66 tablet 3   atorvastatin  (LIPITOR ) 80 MG tablet Take 1 tablet (80 mg total) by mouth daily. 30 tablet 0   busPIRone  (BUSPAR ) 5 MG tablet Take 1 tablet (5 mg total) by mouth 3 (three) times daily. 90 tablet 1   docusate sodium  (COLACE) 100 MG capsule Take 1 capsule (100 mg total) by mouth 2 (two) times daily. 10 capsule 0   glycopyrrolate  (ROBINUL ) 1 MG tablet Take 1 tablet (1 mg total) by mouth 3 (three) times daily. 90 tablet 0   losartan  (COZAAR ) 50 MG tablet Take 1 tablet (50 mg total) by mouth daily. 60  tablet 0   metFORMIN  (GLUCOPHAGE ) 500 MG tablet Take 1 tablet (500 mg total) by mouth 2 (two) times daily with a meal. (Patient not taking: Reported on 01/13/2024) 60 tablet 3   metoCLOPramide  (REGLAN ) 5 MG tablet Take 1 tablet (5 mg total) by mouth 3 (three) times daily before meals. 90 tablet 0   ondansetron  (ZOFRAN ) 4 MG tablet Take 1 tablet (4 mg total) by mouth every 6 (six) hours as needed for nausea or vomiting. 20 tablet 0   scopolamine  (TRANSDERM-SCOP) 1 MG/3DAYS Place 1 patch (1 mg total) onto the skin every 3 (three) days. 10 patch 0   [Paused] sertraline  (ZOLOFT ) 50 MG tablet Take 1 tablet (50 mg total) by mouth daily. 30 tablet 1   No current facility-administered medications for this visit.    REVIEW OF SYSTEMS:   All relevant systems were reviewed with the patient and are negative.  PHYSICAL EXAMINATION: ECOG PERFORMANCE STATUS: {CHL ONC ECOG PS:[phone]}  There were no vitals filed for this visit. There were no vitals filed for this visit.  GENERAL: alert, no distress and comfortable SKIN: skin color normal LUNGS: Effort normal and no respiratory distress.  Clear to auscultation HEART: regular rate & rhythm ABDOMEN: abdomen soft, non-tender Musculoskeletal: no cyanosis and edema.  Right lower extremity *** left lower extremity ***  LABORATORY DATA:  I have reviewed the data as listed   RADIOGRAPHIC STUDIES: I have personally reviewed the radiological images as listed and agreed with the findings in the report. No results found.

## 2024-02-24 ENCOUNTER — Inpatient Hospital Stay: Payer: MEDICAID

## 2024-02-25 ENCOUNTER — Ambulatory Visit: Payer: Self-pay | Admitting: Physician Assistant

## 2024-02-25 NOTE — Progress Notes (Unsigned)
 "     Misty Console, PA-C [address] [place], [place]  [address] Phone: [phone]   Gastroenterology Consultation  Referring Provider:     Buck Search, PA-C Primary Care Physician:  Buck Search, PA-C Primary Gastroenterologist:  Misty Console, PA-C / *** Reason for Consultation:     Hospital follow-up nausea and vomiting        HPI:   Discussed the use of AI scribe software for clinical note transcription with the patient, who gave verbal consent to proceed.  34 year old female presents for hospital follow-up.  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  She was hospitalized again 01/13/2024 until 01/21/2024 for intractable nausea and vomiting.  Recent hysterectomy complicated by tubo-ovarian abscess.  Nausea and vomiting of unclear etiology.  History of type 2 diabetes, PE on Eliquis .  Abdominal CT showed no specific etiology to explain symptoms.  Symptomatic treatment with antiemetics and condition improved.  Sertraline  discontinued.  Symptoms improved with Reglan .  Hypokalemia repleted.  Hypomagnesemia repleted.  Diabetes controlled with A1c 6.3 on metformin .  EGD 12/29/2023 by Dr. Leigh in hospital: 3 cm hiatal hernia, mild local gastritis, otherwise normal stomach, esophagus, and duodenum.  No pathology to explain nausea and vomiting.  Gastric biopsies negative for H. pylori.  01/13/2024 CT abdomen pelvis with contrast: 1. Complex inflammatory process in the right hemipelvis with interval removal of the transgluteal drainage catheter since prior study. 2. Small right adnexal fluid collections, unchanged from the prior study. 3. Decreased size of the right rectus sheath fluid collection.  12/21/2023 RUQ ultrasound: 1. Gallbladder sludge without sonographic evidence of  acute cholecystitis. 2. Hepatic steatosis.  History of Present Illness   PMH: Type 2 diabetes, tubo-ovarian abscess, AKI, exploratory laparotomy, pulmonary embolism (on Eliquis ), hypokalemia, gastric erythema, hyperlipidemia, hypertension.  Past Medical History:  Diagnosis Date   Anxiety    Back pain    Chlamydia    Depression med made her navel itching and  made her sleepy so she quit taking them   Diabetes mellitus without complication (HCC)    Edema, lower extremity    Elevated cholesterol    Fibrocystic breast changes of both breasts    GERD (gastroesophageal reflux disease)    History of cesarean section, classical 12/18/2012   2014    Human papilloma virus    Hypertension    Insomnia    Joint pain    Moderate dysplasia of cervix    Numbness    right arm to hand   Ovarian cyst    Prediabetes    Tubo-ovarian abscess 12/02/2023   Vitamin D  deficiency     Past Surgical History:  Procedure Laterality Date   ABDOMINAL HYSTERECTOMY N/A 12/08/2023   Procedure: HYSTERECTOMY, ABDOMINAL;  Surgeon: Marilynn Nest, DO;  Location: [facility] OR;  Service: Gynecology;  Laterality: N/A;  W/ EXPLORATORY LAPAROTOMY   BREAST BIOPSY Left 2012   benign fibroadeoma   BREAST SURGERY     CESAREAN SECTION N/A 12/13/2012   Procedure: PRIMARY CESAREAN SECTION;  Surgeon: Aida DELENA Na, MD;  Location: [facility] ORS;  Service: Obstetrics;  Laterality: N/A;   ESOPHAGOGASTRODUODENOSCOPY N/A 12/29/2023   Procedure: EGD (ESOPHAGOGASTRODUODENOSCOPY);  Surgeon: Leigh Elspeth SQUIBB, MD;  Location: [facility] ENDOSCOPY;  Service: Gastroenterology;  Laterality: N/A;   IR CV LINE INJECTION  01/07/2024   IR US  GUIDE BX ASP/DRAIN  01/01/2024   IR US  GUIDE BX ASP/DRAIN  01/01/2024   LEEP      Prior to Admission medications  Medication Sig Start Date End Date Taking? Authorizing Provider  amLODipine  (NORVASC ) 5 MG tablet Take 1 tablet (5 mg total) by mouth daily. 12/14/23   Zina Jerilynn LABOR, MD  apixaban  (ELIQUIS ) 5  MG TABS tablet Take 2 tablets by mouth twice a day for 3 doses then start 1 tablet twice a day thereafter 12/23/23   Eveline Lynwood MATSU, MD  atorvastatin  (LIPITOR ) 80 MG tablet Take 1 tablet (80 mg total) by mouth daily. 12/14/23   Ozan, Jennifer, DO  busPIRone  (BUSPAR ) 5 MG tablet Take 1 tablet (5 mg total) by mouth 3 (three) times daily. 01/07/24   Starkes-Perry, Majel RAMAN, FNP  docusate sodium  (COLACE) 100 MG capsule Take 1 capsule (100 mg total) by mouth 2 (two) times daily. 01/08/24   Jayne Vonn DEL, MD  glycopyrrolate  (ROBINUL ) 1 MG tablet Take 1 tablet (1 mg total) by mouth 3 (three) times daily. 12/14/23   Zina Jerilynn LABOR, MD  losartan  (COZAAR ) 50 MG tablet Take 1 tablet (50 mg total) by mouth daily. 12/14/23   Zina Jerilynn LABOR, MD  metFORMIN  (GLUCOPHAGE ) 500 MG tablet Take 1 tablet (500 mg total) by mouth 2 (two) times daily with a meal. Patient not taking: Reported on 01/13/2024 12/23/23   Jayne Vonn DEL, MD  metoCLOPramide  (REGLAN ) 5 MG tablet Take 1 tablet (5 mg total) by mouth 3 (three) times daily before meals. 01/21/24 02/20/24  Briana Elgin LABOR, MD  ondansetron  (ZOFRAN ) 4 MG tablet Take 1 tablet (4 mg total) by mouth every 6 (six) hours as needed for nausea or vomiting. 12/14/23   Zina Jerilynn LABOR, MD  scopolamine  (TRANSDERM-SCOP) 1 MG/3DAYS Place 1 patch (1 mg total) onto the skin every 3 (three) days. 12/15/23   Zina Jerilynn LABOR, MD  [Paused] sertraline  (ZOLOFT ) 50 MG tablet Take 1 tablet (50 mg total) by mouth daily. Wait to take this until your doctor or other care provider tells you to start again. 01/07/24   Starkes-Perry, Majel RAMAN, FNP    Family History  Problem Relation Age of Onset   Diabetes Mother    Heart disease Mother    Hypertension Mother    Depression Mother    Stroke Mother    Arthritis Mother    Anxiety disorder Mother    Stroke Father    High blood pressure Father    High Cholesterol Father    Depression Father    Anxiety disorder Father    Obesity Father     Bipolar disorder Father    Asthma Sister    Kidney disease Brother        genetic condition   Hypertension Maternal Grandmother    Dementia Maternal Grandmother    Heart disease Maternal Grandmother    Diabetes Paternal Grandmother    Diabetes Maternal Aunt    Hypertension Maternal Aunt    Asthma Maternal Aunt    Epilepsy Maternal Aunt      Social History[1]  Allergies as of 02/26/2024 - Review Complete 01/14/2024  Allergen Reaction Noted   Macrobid  [nitrofurantoin  macrocrystal] Itching 12/18/2012    Review of Systems:    All systems reviewed and negative except where noted in HPI.   Physical Exam:  There were no vitals taken for this visit. No LMP recorded. (Menstrual status: Irregular Periods).  General:   Alert,  Well-developed, well-nourished, pleasant and cooperative in NAD Lungs:  Respirations even and unlabored.  Clear throughout to auscultation.  No wheezes, crackles, or rhonchi. No acute distress. Heart:  Regular rate and rhythm; no murmurs, clicks, rubs, or gallops. Abdomen:  Normal bowel sounds.  No bruits.  Soft, and non-distended without masses, hepatosplenomegaly or hernias noted.  No Tenderness.  No guarding or rebound tenderness.    Neurologic:  Alert and oriented x3;  grossly normal neurologically. Psych:  Alert and cooperative. Normal mood and affect.   Imaging Studies: No results found.  Labs: CBC    Component Value Date/Time   WBC 5.9 01/21/2024 0529   RBC 3.43 (L) 01/21/2024 0529   HGB 10.6 (L) 01/21/2024 0529   HGB 13.5 11/20/2022 0930   HCT 32.6 (L) 01/21/2024 0529   HCT 41.8 11/20/2022 0930   PLT 262 01/21/2024 0529   PLT 233 11/20/2022 0930   MCV 95.0 01/21/2024 0529   MCV 95 11/20/2022 0930    CMP     Component Value Date/Time   NA 134 (L) 01/21/2024 0529   NA 139 11/20/2022 0930   K 3.1 (L) 01/21/2024 0529   CL 102 01/21/2024 0529   CO2 23 01/21/2024 0529   GLUCOSE 147 (H) 01/21/2024 0529   BUN 6 01/21/2024 0529   BUN 12  11/20/2022 0930   CREATININE 0.94 01/21/2024 0529   CALCIUM  8.7 (L) 01/21/2024 0529   PROT 6.3 (L) 01/21/2024 0529   PROT 7.0 11/20/2022 0930   ALBUMIN 3.0 (L) 01/21/2024 0529   ALBUMIN 4.3 11/20/2022 0930   AST 47 (H) 01/21/2024 0529   ALT 30 01/21/2024 0529   ALKPHOS 65 01/21/2024 0529   BILITOT 0.4 01/21/2024 0529   BILITOT 0.4 11/20/2022 0930   GFRNONAA >60 01/21/2024 0529   GFRAA 127 11/03/2019 1152    Assessment and Plan:   Misty Walsh is a 34 y.o. y/o female has been referred for ***  Assessment and Plan Assessment & Plan       Follow up ***  Misty Console, PA-C      [1]  Social History Tobacco Use   Smoking status: Former    Current packs/day: 0.00    Average packs/day: 0.3 packs/day for 5.0 years (1.3 ttl pk-yrs)    Types: Cigarettes    Start date: 12/20/2014    Quit date: 12/20/2019    Years since quitting: 4.1   Smokeless tobacco: Never  Vaping Use   Vaping status: Never Used  Substance Use Topics   Alcohol use: Not Currently    Comment: occ   Drug use: No   "

## 2024-02-26 ENCOUNTER — Ambulatory Visit: Payer: Self-pay | Admitting: Physician Assistant
